# Patient Record
Sex: Male | Born: 1965
Health system: Southern US, Community
[De-identification: ages and names within clinical notes are randomized; demographics above are authoritative.]

## PROBLEM LIST (undated history)

## (undated) DIAGNOSIS — E785 Hyperlipidemia, unspecified: Secondary | ICD-10-CM

## (undated) DIAGNOSIS — F419 Anxiety disorder, unspecified: Secondary | ICD-10-CM

## (undated) DIAGNOSIS — M7022 Olecranon bursitis, left elbow: Secondary | ICD-10-CM

## (undated) DIAGNOSIS — Z972 Presence of dental prosthetic device (complete) (partial): Secondary | ICD-10-CM

## (undated) DIAGNOSIS — I10 Essential (primary) hypertension: Secondary | ICD-10-CM

## (undated) DIAGNOSIS — Z955 Presence of coronary angioplasty implant and graft: Secondary | ICD-10-CM

## (undated) DIAGNOSIS — K219 Gastro-esophageal reflux disease without esophagitis: Secondary | ICD-10-CM

## (undated) DIAGNOSIS — J9690 Respiratory failure, unspecified, unspecified whether with hypoxia or hypercapnia: Secondary | ICD-10-CM

## (undated) DIAGNOSIS — I2510 Atherosclerotic heart disease of native coronary artery without angina pectoris: Secondary | ICD-10-CM

## (undated) HISTORY — PX: CARDIAC CATHETERIZATION: SHX172

## (undated) HISTORY — PX: NEUROPLASTY / TRANSPOSITION ULNAR NERVE AT ELBOW: SUR895

## (undated) HISTORY — PX: CORONARY ANGIOPLASTY WITH STENT PLACEMENT: SHX49

## (undated) HISTORY — PX: TONSILLECTOMY: SUR1361

---

## 1998-01-13 ENCOUNTER — Emergency Department (HOSPITAL_COMMUNITY): Admission: EM | Admit: 1998-01-13 | Discharge: 1998-01-13 | Payer: Self-pay | Admitting: Emergency Medicine

## 1998-03-11 ENCOUNTER — Emergency Department (HOSPITAL_COMMUNITY): Admission: EM | Admit: 1998-03-11 | Discharge: 1998-03-11 | Payer: Self-pay | Admitting: Emergency Medicine

## 1998-05-31 ENCOUNTER — Emergency Department (HOSPITAL_COMMUNITY): Admission: EM | Admit: 1998-05-31 | Discharge: 1998-05-31 | Payer: Self-pay | Admitting: Internal Medicine

## 2000-10-12 HISTORY — PX: KNEE ARTHROSCOPY W/ MENISCECTOMY: SHX1879

## 2000-12-09 ENCOUNTER — Emergency Department (HOSPITAL_COMMUNITY): Admission: EM | Admit: 2000-12-09 | Discharge: 2000-12-09 | Payer: Self-pay | Admitting: Emergency Medicine

## 2000-12-09 ENCOUNTER — Encounter: Payer: Self-pay | Admitting: Emergency Medicine

## 2004-10-12 HISTORY — PX: OTHER SURGICAL HISTORY: SHX169

## 2008-02-11 ENCOUNTER — Emergency Department (HOSPITAL_COMMUNITY): Admission: EM | Admit: 2008-02-11 | Discharge: 2008-02-11 | Payer: Self-pay | Admitting: Emergency Medicine

## 2008-02-12 ENCOUNTER — Emergency Department (HOSPITAL_COMMUNITY): Admission: EM | Admit: 2008-02-12 | Discharge: 2008-02-12 | Payer: Self-pay | Admitting: Emergency Medicine

## 2008-12-27 ENCOUNTER — Encounter: Admission: RE | Admit: 2008-12-27 | Discharge: 2008-12-27 | Payer: Self-pay | Admitting: Cardiovascular Disease

## 2008-12-28 ENCOUNTER — Inpatient Hospital Stay (HOSPITAL_COMMUNITY): Admission: RE | Admit: 2008-12-28 | Discharge: 2008-12-29 | Payer: Self-pay | Admitting: Cardiovascular Disease

## 2008-12-28 DIAGNOSIS — Z955 Presence of coronary angioplasty implant and graft: Secondary | ICD-10-CM

## 2008-12-28 HISTORY — DX: Presence of coronary angioplasty implant and graft: Z95.5

## 2009-02-07 ENCOUNTER — Ambulatory Visit: Payer: Self-pay | Admitting: Gastroenterology

## 2009-12-09 ENCOUNTER — Ambulatory Visit (HOSPITAL_COMMUNITY): Admission: RE | Admit: 2009-12-09 | Discharge: 2009-12-09 | Payer: Self-pay | Admitting: Cardiology

## 2011-01-22 LAB — CBC
HCT: 42 % (ref 39.0–52.0)
Hemoglobin: 14.8 g/dL (ref 13.0–17.0)
MCHC: 35.4 g/dL (ref 30.0–36.0)
MCV: 93.1 fL (ref 78.0–100.0)
Platelets: 191 10*3/uL (ref 150–400)
RBC: 4.51 MIL/uL (ref 4.22–5.81)
RDW: 12.5 % (ref 11.5–15.5)
WBC: 13.1 10*3/uL — ABNORMAL HIGH (ref 4.0–10.5)

## 2011-01-22 LAB — BASIC METABOLIC PANEL
BUN: 9 mg/dL (ref 6–23)
CO2: 25 mEq/L (ref 19–32)
Calcium: 9 mg/dL (ref 8.4–10.5)
Chloride: 101 mEq/L (ref 96–112)
Creatinine, Ser: 1.08 mg/dL (ref 0.4–1.5)
GFR calc Af Amer: >60 mL/min (ref >60)
GFR calc non Af Amer: >60 mL/min (ref >60)
Glucose, Bld: 95 mg/dL (ref 70–99)
Potassium: 3.9 mEq/L (ref 3.5–5.1)
Sodium: 137 mEq/L (ref 135–145)

## 2011-01-22 LAB — CARDIAC PANEL(CRET KIN+CKTOT+MB+TROPI)
CK, MB: 1.3 ng/mL (ref 0.3–4.0)
CK, MB: 2.2 ng/mL (ref 0.3–4.0)
Relative Index: INVALID (ref 0.0–2.5)
Total CK: 88 U/L (ref 7–232)
Troponin I: 0.06 ng/mL (ref 0.00–0.06)
Troponin I: 0.13 ng/mL — ABNORMAL HIGH (ref 0.00–0.06)

## 2011-02-24 NOTE — Cardiovascular Report (Signed)
NAME:  Matthew [name], Matthew [name] NO.:  [id number]   MEDICAL RECORD NO.:  [id number]          PATIENT TYPE:  INP   LOCATION:  2507                         FACILITY:  [facility]   PHYSICIAN:  Nanetta Batty, M.D.   DATE OF BIRTH:  04-08-1966   DATE OF PROCEDURE:  DATE OF DISCHARGE:                            CARDIAC CATHETERIZATION   Matthew [name] is a delightful 45 year old mildly overweight, married  Caucasian male who works as a Naval architect for [employer].  He is  referred by Dr. Hal Hope for new onset chest pain which has been  progressive and accelerating.   Matthew [name] profile is positive for a 30-pack-a-year history of  tobacco abuse, currently smoking 1 pack per day.  He has hyperlipidemia  with mildly elevated LDL and low HDL.  He presents now for outpatient  diagnostic coronary angiography to define his anatomy and rule out  ischemic etiology.   DESCRIPTION OF PROCEDURE:  The patient was brought to the Second Floor   Cardiac Cath Lab in the postabsorptive state.  He was  premedicated with p.o. Valium.  His right groin was prepped and shaved  in the usual sterile fashion.  Xylocaine 1% was used for local  anesthesia.  A 6-French sheath was inserted to the right femoral artery  using standard Seldinger technique.  A 6-French right and left Judkins  diagnostic catheters as well as 6-French pigtail catheter were used for  selective coronary angiography, left ventriculography, and distal  abdominal aortography.  Visipaque dye was used through the entirety of  the case.  Retrograde aortic, left ventricular, and pullback pressures  were recorded.   HEMODYNAMICS:  1. Aortic systolic pressure 121, diastolic pressure 73.  2. Left ventricular systolic pressure 127, end-diastolic pressure 8.   SELECTIVE CORONARY ANGIOGRAPHY:  1. Left main normal.  2. LAD normal.  3. Left circumflex normal.  4. Right coronary artery was dominant with tandem 50-60%  blockages in      the proximal and midportion.  5. Left ventriculography; RAO left ventriculogram was performed using      25 mL of Visipaque dye at 12 mL per second.  The overall LVEF was      estimated at greater than 60% without focal wall motion      abnormalities.  6. Distal abdominal aortography; distal abdominal aortogram was      performed using 20 mL of Visipaque dye at 20 mL per second.  The      renal arteries were widely patent.  The infrarenal abdominal aorta      and iliac bifurcation appeared free of significant for      atherosclerotic changes.   IMPRESSION:  Matthew [name] has moderate angiographic stenosis in the  proximal mid dominant right coronary artery without any other lesions in  his coronary tree.  We will proceed with intravascular ultrasound  interrogation and possible intervention.   The patient received 4 baby aspirin prior to coming to the Cath Lab.  He  received Angiomax bolus with an ACT of approximately 300.  Visipaque dye  was used through the entirety of the case.  A 200 mcg of intracoronary  nitroglycerin was administered.   Using a 6-French Cordis JR-4 guide catheter along with a 4190 Asahi soft  wire and IVUS catheter, intravascular ultrasound was performed.  The  proximal and distal reference segment measured approximately 41 x 44.  The minimal lumen diameter in the proximal lesion was 3.83 mm sq and in  the mid lesion 3.7 mm sq with concentric ring of calcium.  The proximal  lesion was then directly stented with a 35 x 18 Xience V Abbott drug-  eluting stent up to 12 atmospheres and postdilated with a 40 x 12 Wickliffe  Voyager up to 14 atmospheres, resulting in reduction of a 60% lesion to  0% residual.  Following this, a cutting balloon atherectomy was  performed in the concentric ring in the mid dominant right using a 2.75  x 10 cutting balloon at nominal pressures.  Following this, stenting was  performed with a 3.5 x 15 Xience overlapping with  the proximal stent.  The stented segment was then postdilated with a 4.0 x 12 Clay Voyager at  14 atmospheres.  A completion IVUS film was performed revealing  excellent stent apposition throughout the entire stented segment.  The  patient tolerated the procedure well.  There were no  electrocardiographic or hemodynamic abnormalities.   IMPRESSION:  Successful intravascular ultrasound-guided percutaneous  coronary intervention and stenting of the proximal and mid dominant  right coronary artery using cutting balloon atherectomy, Xience drug-  eluting stent, and Angiomax.  The Angiomax was turned off at the end of  the case.  Sheath was sewn securely in place.  The guidewire and  catheter were removed.  Angiomax was discontinued.  The patient left Lab  in stable condition.  Dr. Hal Hope was notified of these results.   Plans will be gently hydrate him, discharge him home in the morning on  aspirin and Plavix.  He will also need to start a statin and Niaspan.  He will need counseling for risk factor modification including smoking  cessation.      Nanetta Batty, M.D.  Electronically Signed     JB/MEDQ  D:  12/28/2008  T:  12/29/2008  Job:  [id number]   cc:   Second Floor [facility]  [facility]

## 2011-02-24 NOTE — Discharge Summary (Signed)
NAME:  Matthew [name], Matthew [name] NO.:  [id number]   MEDICAL RECORD NO.:  [id number]          PATIENT TYPE:  INP   LOCATION:  2507                         FACILITY:  [facility]   PHYSICIAN:  Nanetta Batty, M.D.   DATE OF BIRTH:  24-Oct-1965   DATE OF ADMISSION:  12/28/2008  DATE OF DISCHARGE:  12/29/2008                               DISCHARGE SUMMARY   DISCHARGE DIAGNOSES:  1. Chest pain consistent with unstable angina, new onset.  2. Coronary artery disease, elective right coronary artery drug-      eluting stents placed this admission.  3. Smoking.  4. Mixed dyslipidemia.   HOSPITAL COURSE:  The patient is a 45 year old male who was referred to  Dr. Allyson Sabal by Dr. Nadyne Coombes for exertional chest pain consistent with  new onset angina.  He was seen in the office on December 27, 2008.  Please  see Dr. Hazle Coca office note for complete details.  The patient was set  up for an elective catheterization, which was done on December 28, 2008.  This revealed proximal RCA disease of 60% and mid RCA disease of 50%.  This area was treated with 2 Xience drug-eluting stents.  The left main,  circumflex, and LAD were normal.  His LV function was normal.  Renal  arteries, aorta, and iliac arteries were normal.  The patient tolerated  the procedure well.  Postprocedure, he did have a slight bump in his  troponin of 0.13, although his CK-MBs were negative.  Dr. Lynnea Ferrier feels  he can be discharged on December 29, 2008.  We have added low-dose beta-  blocker and statin at discharge, the patient was already on niacin at  home.  We will continue with aspirin and he will be on lifelong Plavix.   DISCHARGE MEDICATIONS:  1. Coated aspirin 325 mg a day.  2. Plavix 75 mg a day.  3. Nitroglycerin 0.4 mg sublingual p.r.n.  4. Metoprolol 12.5 mg b.i.d.  5. Simvastatin 40 mg a day.  6. Niacin 500 mg at bedtime.  7. The patient will continue his Cymbalta 60 mg a day.   LABORATORY DATA:  At discharge, his  white count is 13.1, hemoglobin  14.8, hematocrit 42.0, and platelets 191.  Sodium 137, potassium 3.9,  BUN 9, and creatinine 1.8.  CK-MBs are negative.  Troponin post PCI was  slightly elevated at 0.13.  Chest x-ray did show some evidence of  bronchitis.   PLAN:  The patient is discharged in stable condition.  At discharge, he  does not have a productive cough and his lungs are clear.  He is not  febrile.  We have strongly encouraged him to stop smoking.  We will try  and get him involved in Cardiac Rehab.  For now, he will be out of work  until he sees Dr. Allyson Sabal back.      Abelino Derrick, P.A.      Nanetta Batty, M.D.  Electronically Signed    LKK/MEDQ  D:  12/29/2008  T:  12/29/2008  Job:  [id number]   cc:   Clydie Braun L. Hal Hope, M.D.

## 2016-03-27 ENCOUNTER — Other Ambulatory Visit (HOSPITAL_COMMUNITY): Payer: Self-pay | Admitting: Internal Medicine

## 2016-03-27 DIAGNOSIS — I519 Heart disease, unspecified: Secondary | ICD-10-CM

## 2016-03-30 ENCOUNTER — Ambulatory Visit (HOSPITAL_COMMUNITY)
Admission: RE | Admit: 2016-03-30 | Discharge: 2016-03-30 | Disposition: A | Discharge status: Home / Self Care | Payer: Self-pay | Source: Ambulatory Visit | Attending: Internal Medicine | Admitting: Internal Medicine

## 2016-03-30 DIAGNOSIS — I517 Cardiomegaly: Secondary | ICD-10-CM | POA: Insufficient documentation

## 2016-03-30 DIAGNOSIS — I071 Rheumatic tricuspid insufficiency: Secondary | ICD-10-CM | POA: Insufficient documentation

## 2016-03-30 DIAGNOSIS — I2510 Atherosclerotic heart disease of native coronary artery without angina pectoris: Secondary | ICD-10-CM | POA: Insufficient documentation

## 2016-03-30 DIAGNOSIS — I519 Heart disease, unspecified: Secondary | ICD-10-CM | POA: Insufficient documentation

## 2016-03-30 HISTORY — PX: TRANSTHORACIC ECHOCARDIOGRAM: SHX275

## 2016-03-30 LAB — ECHOCARDIOGRAM COMPLETE
EERAT: 4.95
FS: 24 % — AB (ref 28–44)
IVS/LV PW RATIO, ED: 1.02
LA ID, A-P, ES: 39.5 mm
LEFT ATRIUM END SYS DIAM: 39.5 mm
LV PW d: 11.8 mm — AB (ref 0.6–1.1)
LV SIMPSON'S DISK: 49
LVEEAVG: 4.95
LVEEMED: 4.95
LVELAT: 15.3 cm/s
LVOT area: 3.9 cm2
LVOT diameter: 22.3 mm
MV Peak grad: 2 mmHg
MV pk A vel: 74.3 m/s
MV pk E vel: 75.7 m/s
Stroke v: 44 ml
TDI e' lateral: 15.3
TDI e' medial: 9.21

## 2016-03-30 NOTE — Progress Notes (Signed)
Echocardiogram 2D Echocardiogram has been performed.  Matthew [name] 03/30/2016, 9:57 AM

## 2017-04-30 ENCOUNTER — Encounter (HOSPITAL_BASED_OUTPATIENT_CLINIC_OR_DEPARTMENT_OTHER): Payer: Self-pay | Admitting: *Deleted

## 2017-04-30 NOTE — H&P (Signed)
Matthew [name] is an 51 y.o. male.   Chief Complaint: LEFT ELBOW OLECRANON BURSITIS  HPI: Matthew [name] IS A 51 Y/O RIGHT HAND DOMINANT MALE WHO HAS HAD SWELLING OF THE LEFT OLECRANON BURSA FOR SEVERAL MONTHS WITH NO KNOWN INJURY. HE HAS HAD THE OLECRANON BURSA ASPIRATED ON TWO OCCASIONS WITH THE MOST RECENT BEING ON 04/08/17. AFTER BOTH ASPIRATIONS, THE BURSA FILLED BACK UP AFTER A COUPLE OF DAYS.  DISCUSSED THE REASON AND RATIONALE FOR SURGICAL REMOVAL OF THE BURSA.  DISCUSSED THE SURGICAL PROCEDURE, INCLUDING RISKS VERSUS BENEFITS, AND THE POST-OPERATIVE RECOVERY PROCESS.  HE IS HERE TODAY FOR SURGERY.  HE DENIES NUMBNESS, TINGLING, CHEST PAIN, SHORTNESS OF BREATH, NAUSEA, VOMITING, OR DIARRHEA.  Past Medical History:  Diagnosis Date  . Coronary artery disease per pt last cardiology visit 2011 (found documentation 12-05-2009 w/ dr Leonia Reeves with [facility] in epic)/  currently pt is followed by pcp   12-28-2008  PCI and DES x2 to proximal and mid RCA/ last cardiac cath 12-09-2009  widely patent stents  . GERD (gastroesophageal reflux disease)   . Hyperlipidemia   . Olecranon bursitis of left elbow   . S/P right coronary artery (RCA) stent placement 12/28/2008   PCI and DES x2-- proximal and mid RCA  . Wears dentures    UPPER    Past Surgical History:  Procedure Laterality Date  . CARDIAC CATHETERIZATION  12-09-2009  dr Ilda Foil   abnormal stress test (12-05-2009) widely patent coronaries present w/ evidence of diffuse atherosclerotic disease/  widely patent stented segments in the proximal and mid segment RCA/  LVEF 65%  . CORONARY ANGIOPLASTY WITH STENT PLACEMENT  12-28-2008  dr berry   PCI and stenting of the proxmial and mid RCA using cutting balloon atherectomy (x2 DES, Xience)/  LVEF >60% w/ normal wall motion  . KNEE ARTHROSCOPY W/ MENISCECTOMY Right 2002  . NEUROPLASTY / TRANSPOSITION ULNAR NERVE AT ELBOW Right 10/2014   dr Caralyn Guile  . ORIF RIGHT ANKLE FX  2006  . TONSILLECTOMY   age 71  . TRANSTHORACIC ECHOCARDIOGRAM  03/30/2016   mild concentric LVH, ef 50-55%/  trivial TR    History reviewed. No pertinent family history. Social History:  reports that he has been smoking Cigarettes.  He has a 78.00 pack-year smoking history. He has never used smokeless tobacco. He reports that he drinks alcohol. He reports that he does not use drugs.  Allergies:  Allergies  Allergen Reactions  . Codeine Nausea And Vomiting  . Onion Swelling    "thoart swells up"    No prescriptions prior to admission.    No results found for this or any previous visit (from the past 48 hour(s)). No results found.  ROS NO RECENT ILLNESSES OR HOSPITALIZATIONS  Height 5\' 11"  (1.803 m), weight 104.3 kg (230 lb). Physical Exam  General Appearance:  Alert, cooperative, no distress, appears stated age  Head:  Normocephalic, without obvious abnormality, atraumatic  Eyes:  Pupils equal, conjunctiva/corneas clear,         Throat: Lips, mucosa, and tongue normal; teeth and gums normal  Neck: No visible masses     Lungs:   respirations unlabored  Chest Wall:  No tenderness or deformity  Heart:  Regular rate and rhythm,  Abdomen:   Soft, non-tender,         Extremities: LUE: MODERATE SWELLING OF THE OLECRANON BURSA WITH NO ERYTHEMA, ECCHYMOSIS OR PURULENT DRAINAGE. NO INCREASED WARMTH OF THE ELBOW. CAPILLARY REFILL LESS THAN 2 SECONDS. SENSATION INTACT TO  LIGHT TOUCH. BURSA IS TENDER TO PALPATION. FULL RANGE OF MOTION OF THE ELBOW, WRIST, AND HAND.   Pulses: 2+ and symmetric  Skin: Skin color, texture, turgor normal, no rashes or lesions     Neurologic: Normal    Assessment LEFT ELBOW OLECRANON BURSITIS  Plan LEFT ELBOW OLECRANON BURSA EXCISION R/B/A DISCUSSED WITH PT IN OFFICE.  PT VOICED UNDERSTANDING OF PLAN CONSENT SIGNED DAY OF SURGERY PT SEEN AND EXAMINED PRIOR TO OPERATIVE PROCEDURE/DAY OF SURGERY SITE MARKED. QUESTIONS ANSWERED WILL GO HOME FOLLOWING SURGERY  WE ARE  PLANNING SURGERY FOR YOUR UPPER EXTREMITY. THE RISKS AND BENEFITS OF SURGERY INCLUDE BUT NOT LIMITED TO BLEEDING INFECTION, DAMAGE TO NEARBY NERVES ARTERIES TENDONS, FAILURE OF SURGERY TO ACCOMPLISH ITS INTENDED GOALS, PERSISTENT SYMPTOMS AND NEED FOR FURTHER SURGICAL INTERVENTION. WITH THIS IN MIND WE WILL PROCEED. I HAVE DISCUSSED WITH THE PATIENT THE PRE AND POSTOPERATIVE REGIMEN AND THE DOS AND DON'TS. PT VOICED UNDERSTANDING AND INFORMED CONSENT SIGNED.  Brynda Peon 04/30/2017, 10:34 AM

## 2017-04-30 NOTE — Progress Notes (Addendum)
NPO AFTER MN W/ EXCEPTION CLEAR LIQUIDS UNTIL 0700 (NO CREAM [place] [employer]).  ARRIVE AT 1115.  NEEDS HG AND EKG.  WILL TAKE ZANTAC AM DOS W/ SIPS OF WATER.    ADDENDUM:  PT DOES NOT SEE CARIOLOGIST BY IS FOLLOWED BY PCP DR RAMACHANDRAM/ DR BRAIN MACKENZIE, REQUESTED AND RECEIVED LAST OFFICE NOTE VIA FAX . WITH THE CHART.

## 2017-05-06 ENCOUNTER — Ambulatory Visit (HOSPITAL_BASED_OUTPATIENT_CLINIC_OR_DEPARTMENT_OTHER): Payer: 59 | Admitting: Anesthesiology

## 2017-05-06 ENCOUNTER — Encounter (HOSPITAL_BASED_OUTPATIENT_CLINIC_OR_DEPARTMENT_OTHER): Payer: Self-pay | Admitting: *Deleted

## 2017-05-06 ENCOUNTER — Other Ambulatory Visit: Payer: Self-pay

## 2017-05-06 ENCOUNTER — Ambulatory Visit (HOSPITAL_BASED_OUTPATIENT_CLINIC_OR_DEPARTMENT_OTHER)
Admission: RE | Admit: 2017-05-06 | Discharge: 2017-05-06 | Disposition: A | Discharge status: Home / Self Care | Payer: 59 | Source: Ambulatory Visit | Attending: Orthopedic Surgery | Admitting: Orthopedic Surgery

## 2017-05-06 ENCOUNTER — Encounter (HOSPITAL_BASED_OUTPATIENT_CLINIC_OR_DEPARTMENT_OTHER)
Admission: RE | Disposition: A | Discharge status: Home / Self Care | Payer: Self-pay | Source: Ambulatory Visit | Attending: Orthopedic Surgery

## 2017-05-06 DIAGNOSIS — M7022 Olecranon bursitis, left elbow: Secondary | ICD-10-CM

## 2017-05-06 DIAGNOSIS — K648 Other hemorrhoids: Secondary | ICD-10-CM | POA: Insufficient documentation

## 2017-05-06 DIAGNOSIS — D122 Benign neoplasm of ascending colon: Secondary | ICD-10-CM | POA: Insufficient documentation

## 2017-05-06 DIAGNOSIS — Z1211 Encounter for screening for malignant neoplasm of colon: Secondary | ICD-10-CM | POA: Insufficient documentation

## 2017-05-06 DIAGNOSIS — K644 Residual hemorrhoidal skin tags: Secondary | ICD-10-CM | POA: Insufficient documentation

## 2017-05-06 DIAGNOSIS — K5730 Diverticulosis of large intestine without perforation or abscess without bleeding: Secondary | ICD-10-CM | POA: Insufficient documentation

## 2017-05-06 HISTORY — DX: Olecranon bursitis, left elbow: M70.22

## 2017-05-06 HISTORY — DX: Presence of dental prosthetic device (complete) (partial): Z97.2

## 2017-05-06 HISTORY — DX: Gastro-esophageal reflux disease without esophagitis: K21.9

## 2017-05-06 HISTORY — DX: Atherosclerotic heart disease of native coronary artery without angina pectoris: I25.10

## 2017-05-06 HISTORY — DX: Hyperlipidemia, unspecified: E78.5

## 2017-05-06 HISTORY — PX: OLECRANON BURSECTOMY: SHX2097

## 2017-05-06 HISTORY — DX: Presence of coronary angioplasty implant and graft: Z95.5

## 2017-05-06 SURGERY — BURSECTOMY, ELBOW
Anesthesia: General | Site: Elbow | Laterality: Left

## 2017-05-06 MED ORDER — KETOROLAC TROMETHAMINE 30 MG/ML IJ SOLN
30.0000 mg | Freq: Once | INTRAMUSCULAR | Status: DC | PRN
  Filled 2017-05-06: qty 1

## 2017-05-06 MED ORDER — DEXAMETHASONE SODIUM PHOSPHATE 10 MG/ML IJ SOLN
INTRAMUSCULAR | Status: AC
  Filled 2017-05-06: qty 1

## 2017-05-06 MED ORDER — FENTANYL CITRATE (PF) 100 MCG/2ML IJ SOLN
INTRAMUSCULAR | Status: DC | PRN
  Administered 2017-05-06: 50 ug via INTRAVENOUS
  Administered 2017-05-06: 100 ug via INTRAVENOUS

## 2017-05-06 MED ORDER — FENTANYL CITRATE (PF) 100 MCG/2ML IJ SOLN
INTRAMUSCULAR | Status: AC
  Filled 2017-05-06: qty 4

## 2017-05-06 MED ORDER — LIDOCAINE 2% (20 MG/ML) 5 ML SYRINGE
INTRAMUSCULAR | Status: AC
  Filled 2017-05-06: qty 5

## 2017-05-06 MED ORDER — PROPOFOL 10 MG/ML IV BOLUS
INTRAVENOUS | Status: DC | PRN
  Administered 2017-05-06: 270 mg via INTRAVENOUS

## 2017-05-06 MED ORDER — BUPIVACAINE-EPINEPHRINE (PF) 0.5% -1:200000 IJ SOLN
INTRAMUSCULAR | Status: DC | PRN
  Administered 2017-05-06: 9 mL via PERINEURAL

## 2017-05-06 MED ORDER — FENTANYL CITRATE (PF) 100 MCG/2ML IJ SOLN
25.0000 ug | INTRAMUSCULAR | Status: DC | PRN
  Filled 2017-05-06: qty 1

## 2017-05-06 MED ORDER — ONDANSETRON HCL 4 MG/2ML IJ SOLN
INTRAMUSCULAR | Status: AC
  Filled 2017-05-06: qty 2

## 2017-05-06 MED ORDER — MIDAZOLAM HCL 2 MG/2ML IJ SOLN
INTRAMUSCULAR | Status: AC
  Filled 2017-05-06: qty 2

## 2017-05-06 MED ORDER — PROMETHAZINE HCL 25 MG/ML IJ SOLN
6.2500 mg | INTRAMUSCULAR | Status: DC | PRN
  Filled 2017-05-06: qty 1

## 2017-05-06 MED ORDER — EPHEDRINE 5 MG/ML INJ
INTRAVENOUS | Status: AC
  Filled 2017-05-06: qty 20

## 2017-05-06 MED ORDER — DEXAMETHASONE SODIUM PHOSPHATE 4 MG/ML IJ SOLN
INTRAMUSCULAR | Status: DC | PRN
  Administered 2017-05-06: 10 mg via INTRAVENOUS

## 2017-05-06 MED ORDER — CEFAZOLIN SODIUM-DEXTROSE 2-4 GM/100ML-% IV SOLN
2.0000 g | INTRAVENOUS | Status: AC
  Administered 2017-05-06: 2 g via INTRAVENOUS
  Filled 2017-05-06: qty 100

## 2017-05-06 MED ORDER — SODIUM CHLORIDE 0.9 % IR SOLN
Status: DC | PRN
  Administered 2017-05-06: 1000 mL

## 2017-05-06 MED ORDER — CEFAZOLIN SODIUM-DEXTROSE 2-4 GM/100ML-% IV SOLN
INTRAVENOUS | Status: AC
  Filled 2017-05-06: qty 100

## 2017-05-06 MED ORDER — LIDOCAINE 2% (20 MG/ML) 5 ML SYRINGE
INTRAMUSCULAR | Status: DC | PRN
  Administered 2017-05-06: 70 mg via INTRAVENOUS

## 2017-05-06 MED ORDER — ONDANSETRON HCL 4 MG/2ML IJ SOLN
INTRAMUSCULAR | Status: DC | PRN
  Administered 2017-05-06: 4 mg via INTRAVENOUS

## 2017-05-06 MED ORDER — PROPOFOL 10 MG/ML IV BOLUS
INTRAVENOUS | Status: AC
  Filled 2017-05-06: qty 20

## 2017-05-06 MED ORDER — MIDAZOLAM HCL 5 MG/5ML IJ SOLN
INTRAMUSCULAR | Status: DC | PRN
  Administered 2017-05-06: 2 mg via INTRAVENOUS

## 2017-05-06 MED ORDER — CHLORHEXIDINE GLUCONATE 4 % EX LIQD
60.0000 mL | Freq: Once | CUTANEOUS | Status: DC
  Filled 2017-05-06: qty 118

## 2017-05-06 MED ORDER — LACTATED RINGERS IV SOLN
INTRAVENOUS | Status: DC
  Administered 2017-05-06 (×2): via INTRAVENOUS
  Filled 2017-05-06: qty 1000

## 2017-05-06 SURGICAL SUPPLY — 69 items
BANDAGE ACE 4X5 VEL STRL LF (GAUZE/BANDAGES/DRESSINGS) ×3 IMPLANT
BANDAGE ELASTIC 4 VELCRO ST LF (GAUZE/BANDAGES/DRESSINGS) ×6 IMPLANT
BLADE SURG 15 STRL LF DISP TIS (BLADE) ×1 IMPLANT
BLADE SURG 15 STRL SS (BLADE) ×3
BNDG CMPR 9X4 STRL LF SNTH (GAUZE/BANDAGES/DRESSINGS) ×1
BNDG COHESIVE 4X5 TAN NS LF (GAUZE/BANDAGES/DRESSINGS) IMPLANT
BNDG CONFORM 3 STRL LF (GAUZE/BANDAGES/DRESSINGS) ×3 IMPLANT
BNDG ESMARK 4X9 LF (GAUZE/BANDAGES/DRESSINGS) ×3 IMPLANT
BNDG GAUZE ELAST 4 BULKY (GAUZE/BANDAGES/DRESSINGS) ×2 IMPLANT
CANISTER SUCT 1200ML W/VALVE (MISCELLANEOUS) ×3 IMPLANT
CORDS BIPOLAR (ELECTRODE) ×2 IMPLANT
COVER BACK TABLE 60X90IN (DRAPES) ×3 IMPLANT
CUFF TOURNIQUET SINGLE 18IN (TOURNIQUET CUFF) ×3 IMPLANT
DRAPE EXTREMITY T 121X128X90 (DRAPE) ×3 IMPLANT
DRAPE LG THREE QUARTER DISP (DRAPES) ×3 IMPLANT
DRAPE U-SHAPE 47X51 STRL (DRAPES) ×3 IMPLANT
DRSG ADAPTIC 3X8 NADH LF (GAUZE/BANDAGES/DRESSINGS) ×2 IMPLANT
ELECT NDL TIP 2.8 STRL (NEEDLE) IMPLANT
ELECT NEEDLE TIP 2.8 STRL (NEEDLE) IMPLANT
ELECT REM PT RETURN 9FT ADLT (ELECTROSURGICAL) ×3
ELECTRODE REM PT RTRN 9FT ADLT (ELECTROSURGICAL) ×1 IMPLANT
GAUZE SPONGE 4X4 12PLY STRL (GAUZE/BANDAGES/DRESSINGS) ×2 IMPLANT
GAUZE XEROFORM 1X8 LF (GAUZE/BANDAGES/DRESSINGS) ×3 IMPLANT
GLOVE BIO SURGEON STRL SZ 6.5 (GLOVE) ×2 IMPLANT
GLOVE BIO SURGEONS STRL SZ 6.5 (GLOVE) ×1
GLOVE BIOGEL PI IND STRL 7.0 (GLOVE) ×1 IMPLANT
GLOVE BIOGEL PI INDICATOR 7.0 (GLOVE) ×2
GLOVE INDICATOR 8.5 STRL (GLOVE) ×3 IMPLANT
GLOVE SURG ORTHO 8.0 STRL STRW (GLOVE) ×3 IMPLANT
GOWN STRL REUS W/ TWL LRG LVL3 (GOWN DISPOSABLE) ×1 IMPLANT
GOWN STRL REUS W/TWL LRG LVL3 (GOWN DISPOSABLE) ×3
KIT RM TURNOVER CYSTO AR (KITS) ×3 IMPLANT
NDL 1/2 CIR CATGUT .05X1.09 (NEEDLE) IMPLANT
NDL HYPO 25X1 1.5 SAFETY (NEEDLE) ×1 IMPLANT
NEEDLE 1/2 CIR CATGUT .05X1.09 (NEEDLE) IMPLANT
NEEDLE HYPO 25X1 1.5 SAFETY (NEEDLE) ×3 IMPLANT
NS IRRIG 500ML POUR BTL (IV SOLUTION) ×3 IMPLANT
PACK BASIN DAY SURGERY FS (CUSTOM PROCEDURE TRAY) ×3 IMPLANT
PAD CAST 4YDX4 CTTN HI CHSV (CAST SUPPLIES) ×1 IMPLANT
PADDING CAST COTTON 4X4 STRL (CAST SUPPLIES) ×3
PENCIL BUTTON HOLSTER BLD 10FT (ELECTRODE) ×3 IMPLANT
SLING ARM LRG ADULT FOAM STRAP (SOFTGOODS) ×3 IMPLANT
SLING ARM MED ADULT FOAM STRAP (SOFTGOODS) IMPLANT
SPLINT FAST PLASTER 5X30 (CAST SUPPLIES)
SPLINT FIBERGLASS 3X35 (CAST SUPPLIES) IMPLANT
SPLINT FIBERGLASS 4X30 (CAST SUPPLIES) ×3 IMPLANT
SPLINT PLASTER CAST FAST 5X30 (CAST SUPPLIES) IMPLANT
STOCKINETTE 4X48 STRL (DRAPES) ×3 IMPLANT
SUCTION FRAZIER HANDLE 10FR (MISCELLANEOUS) ×2
SUCTION TUBE FRAZIER 10FR DISP (MISCELLANEOUS) ×1 IMPLANT
SUT MNCRL AB 3-0 PS2 18 (SUTURE) ×3 IMPLANT
SUT MNCRL AB 4-0 PS2 18 (SUTURE) ×3 IMPLANT
SUT MON AB 3-0 SH 27 (SUTURE)
SUT MON AB 3-0 SH27 (SUTURE) IMPLANT
SUT PROLENE 3 0 PS 1 (SUTURE) ×4 IMPLANT
SUT PROLENE 4 0 PS 2 18 (SUTURE) IMPLANT
SUT VIC AB 0 CT1 27 (SUTURE)
SUT VIC AB 0 CT1 27XBRD ANBCTR (SUTURE) IMPLANT
SUT VIC AB 2-0 PS2 27 (SUTURE) IMPLANT
SUT VIC AB 2-0 SH 27 (SUTURE)
SUT VIC AB 2-0 SH 27XBRD (SUTURE) IMPLANT
SUT VICRYL 4-0 PS2 18IN ABS (SUTURE) IMPLANT
SYR BULB 3OZ (MISCELLANEOUS) ×3 IMPLANT
SYR CONTROL 10ML LL (SYRINGE) ×3 IMPLANT
TOWEL OR 17X24 6PK STRL BLUE (TOWEL DISPOSABLE) ×6 IMPLANT
TRAY DSU PREP LF (CUSTOM PROCEDURE TRAY) ×3 IMPLANT
TUBE CONNECTING 12'X1/4 (SUCTIONS) ×1
TUBE CONNECTING 12X1/4 (SUCTIONS) ×2 IMPLANT
UNDERPAD 30X30 INCONTINENT (UNDERPADS AND DIAPERS) ×3 IMPLANT

## 2017-05-06 NOTE — Op Note (Signed)
PREOPERATIVE DIAGNOSIS: Left elbow olecranon bursitis  POSTOPERATIVE DIAGNOSIS: Left elbow olecranon bursitis  ATTENDING SURGEON: Dr. Iran Planas who was scrubbed and present for the entire procedure  ASSISTANT SURGEON: None  ANESTHESIA: Gen. via laryngeal mask airway  OPERATIVE PROCEDURE: Left elbow olecranon bursectomy  IMPLANTS: None  RADIOGRAPHIC INTERPRETATION: None  SURGICAL INDICATIONS: Mr. Fabio Neighbors a right-hand-dominant gentleman with a persistent olecranon bursitis. Patient been refractory to conservative treatment. Risks benefits and alternatives were discussed in detail with the patient in a signed informed consent was obtained. Risks of surgery include but not limited to bleeding infection damage to nearby nerves arteries or tendons loss of motion of the wrists and digits incomplete relief of symptoms and need for further surgical intervention.  SURGICAL TECHNIQUE: Patient is properly identified in the preoperative holding area and a mark with a permanent marker made on the left elbow. Patient was then brought back to the operating room and placed supine on anesthesia and table where general anesthesia was administered. Patient tolerated this well. Preoperative antibiotics were given. A well-padded tourniquet was then placed on the left brachium and sealed with a sterile drape. The left upper extremity was then prepped and draped in normal sterile fashion. Timeout was called cracks I was identified and the procedure was then begun. Attention turned to the lateral left elbow curvilinear incision was made posteriorly. This is curving radially around the olecranon tip. Dissection carried down through the skin subtendinous tissue with a large olecranon bursa was then circumferentially dissected. The bursa sac was then dissected free from this the subcutaneous tissues. Bipolar cautery was used for an electrocautery used for hemostasis. After removal of the bursa sac the wound was then  thoroughly irrigated. No other abnormalities were noted. The tourniquet deflated hemostasis then obtained the subcutaneous tissues were then closed with Monocryl and the skin closed with simple Prolene suture. Adaptic dressing a sterile compressive bandage then applied. Quarter percent Marcaine with epinephrine were injected locally. Patient is then placed in well-padded's splint explained taken recovery room in good condition.  POSTOPERATIVE PLAN: Patient is be discharged to home seen back in the office in approximately one week for wound check and likely back into the splint until the sutures, out. Follow him closely given the large space of the bursa and potential fluid accumulation for the dead space. No radiographs the first visit.

## 2017-05-06 NOTE — Anesthesia Preprocedure Evaluation (Addendum)
Anesthesia Evaluation  Patient identified by MRN, date of birth, ID band Patient awake    Reviewed: Allergy & Precautions, NPO status , Patient's Chart, lab work & pertinent test results  Airway Mallampati: II  TM Distance: >3 FB Neck ROM: Full    Dental no notable dental hx. (+) Edentulous Upper, Edentulous Lower   Pulmonary Current Smoker,    Pulmonary exam normal breath sounds clear to auscultation       Cardiovascular negative cardio ROS Normal cardiovascular exam Rhythm:Regular Rate:Normal     Neuro/Psych Anxiety negative neurological ROS  negative psych ROS   GI/Hepatic Neg liver ROS, GERD  Medicated,  Endo/Other  negative endocrine ROS  Renal/GU negative Renal ROS  negative genitourinary   Musculoskeletal negative musculoskeletal ROS (+)   Abdominal   Peds negative pediatric ROS (+)  Hematology negative hematology ROS (+)   Anesthesia Other Findings   Reproductive/Obstetrics negative OB ROS                           Anesthesia Physical Anesthesia Plan  ASA: II  Anesthesia Plan: General   Post-op Pain Management:    Induction: Intravenous  PONV Risk Score and Plan:   Airway Management Planned: LMA and Oral ETT  Additional Equipment:   Intra-op Plan:   Post-operative Plan: Extubation in OR  Informed Consent: I have reviewed the patients History and Physical, chart, labs and discussed the procedure including the risks, benefits and alternatives for the proposed anesthesia with the patient or authorized representative who has indicated his/her understanding and acceptance.   Dental advisory given  Plan Discussed with: CRNA and Surgeon  Anesthesia Plan Comments:         Anesthesia Quick Evaluation

## 2017-05-06 NOTE — Transfer of Care (Signed)
Immediate Anesthesia Transfer of Care Note  Patient: Ry Moody Pereyra  Procedure(s) Performed: Procedure(s): LEFT ELBOW OLECRANON BURSA EXCISION (Left)  Patient Location: PACU  Anesthesia Type:General  Level of Consciousness: awake, alert , oriented and patient cooperative  Airway & Oxygen Therapy: Patient Spontanous Breathing and Patient connected to nasal cannula oxygen  Post-op Assessment: Report given to RN and Post -op Vital signs reviewed and stable  Post vital signs: Reviewed and stable  Last Vitals:  Vitals:   05/06/17 1126  BP: (!) 143/91  Pulse: 70  Resp: 16  Temp: 36.4 C    Last Pain:  Vitals:   05/06/17 1126  TempSrc: Oral      Patients Stated Pain Goal: 9 (31/67/42 5525)  Complications: No apparent anesthesia complications

## 2017-05-06 NOTE — Anesthesia Procedure Notes (Signed)
Procedure Name: LMA Insertion Date/Time: 05/06/2017 1:53 PM Performed by: Wanita Chamberlain Pre-anesthesia Checklist: Patient identified, Timeout performed, Emergency Drugs available, Suction available and Patient being monitored Patient Re-evaluated:Patient Re-evaluated prior to induction Oxygen Delivery Method: Circle system utilized Preoxygenation: Pre-oxygenation with 100% oxygen Induction Type: IV induction Ventilation: Mask ventilation without difficulty LMA: LMA inserted LMA Size: 4.0 Number of attempts: 1 Placement Confirmation: breath sounds checked- equal and bilateral and positive ETCO2 Tube secured with: Tape Dental Injury: Teeth and Oropharynx as per pre-operative assessment

## 2017-05-06 NOTE — Discharge Instructions (Signed)
KEEP BANDAGE CLEAN AND DRY CALL OFFICE FOR F/U APPT [phone] in 7 days KEEP HAND ELEVATED ABOVE HEART OK TO APPLY ICE TO OPERATIVE AREA CONTACT OFFICE IF ANY WORSENING PAIN OR CONCERNS.        HAND SURGERY    HOME CARE INSTRUCTIONS    The following instructions have been prepared to help you care for yourself upon your return home today.  Wound Care:  Keep your hand elevated above the level of your heart. Do not allow it to dangle by your side. Keep the dressing dry and do not remove it unless your doctor advises you to do so. He will usually change it at the time of you post-op visit. Moving your fingers is advised to stimulate circulation but will depend on the site of your surgery. Of course, if you have a splint applied your doctor will advise you about movement.  Activity:  Do not drive or operate machinery today. Rest today and then you may return to your normal activity and work as indicated by your physician.  Diet: Drink liquids today or eat a light diet. You may resume a regular diet tomorrow.  General expectations: Pain for two or three days. Fingers may become slightly swollen.   Unexpected Observations- Call your doctor if any of these occur: Severe pain not relieved by pain medication. Elevated temperature. Dressing soaked with blood. Inability to move fingers. White or bluish color to fingers.  Post Anesthesia Home Care Instructions  Activity: Get plenty of rest for the remainder of the day. A responsible individual must stay with you for 24 hours following the procedure.  For the next 24 hours, DO NOT: -Drive a car -Paediatric nurse -Drink alcoholic beverages -Take any medication unless instructed by your physician -Make any legal decisions or sign important papers.  Meals: Start with liquid foods such as gelatin or soup. Progress to regular foods as tolerated. Avoid greasy, spicy, heavy foods. If nausea and/or vomiting occur, drink only clear liquids  until the nausea and/or vomiting subsides. Call your physician if vomiting continues.  Special Instructions/Symptoms: Your throat may feel dry or sore from the anesthesia or the breathing tube placed in your throat during surgery. If this causes discomfort, gargle with warm salt water. The discomfort should disappear within 24 hours.  If you had a scopolamine patch placed behind your ear for the management of post- operative nausea and/or vomiting:  1. The medication in the patch is effective for 72 hours, after which it should be removed.  Wrap patch in a tissue and discard in the trash. Wash hands thoroughly with soap and water. 2. You may remove the patch earlier than 72 hours if you experience unpleasant side effects which may include dry mouth, dizziness or visual disturbances. 3. Avoid touching the patch. Wash your hands with soap and water after contact with the patch.

## 2017-05-07 ENCOUNTER — Encounter (HOSPITAL_BASED_OUTPATIENT_CLINIC_OR_DEPARTMENT_OTHER): Payer: Self-pay | Admitting: Orthopedic Surgery

## 2017-05-07 LAB — POCT HEMOGLOBIN-HEMACUE: HEMOGLOBIN: 15.8 g/dL (ref 13.0–17.0)

## 2017-05-10 NOTE — Anesthesia Postprocedure Evaluation (Signed)
Anesthesia Post Note  Patient: Matthew [name]  Procedure(s) Performed: Procedure(s) (LRB): LEFT ELBOW OLECRANON BURSA EXCISION (Left)     Patient location during evaluation: PACU Anesthesia Type: General Level of consciousness: awake and alert Pain management: pain level controlled Vital Signs Assessment: post-procedure vital signs reviewed and stable Respiratory status: spontaneous breathing, nonlabored ventilation, respiratory function stable and patient connected to nasal cannula oxygen Cardiovascular status: blood pressure returned to baseline and stable Postop Assessment: no signs of nausea or vomiting Anesthetic complications: no    Last Vitals:  Vitals:   05/06/17 1530 05/06/17 1615  BP: 102/68 132/79  Pulse: 67 (!) 56  Resp: 16 16  Temp:  36.6 C    Last Pain:  Vitals:   05/06/17 1600  TempSrc:   PainSc: 6                  Adron Geisel S

## 2019-11-14 ENCOUNTER — Encounter (HOSPITAL_COMMUNITY): Payer: Self-pay

## 2019-11-14 ENCOUNTER — Encounter (HOSPITAL_COMMUNITY): Payer: Self-pay | Admitting: Emergency Medicine

## 2019-11-14 ENCOUNTER — Emergency Department (HOSPITAL_COMMUNITY)
Admission: EM | Admit: 2019-11-14 | Discharge: 2019-11-14 | Disposition: A | Discharge status: Home / Self Care | Payer: 59 | Attending: Emergency Medicine | Admitting: Emergency Medicine

## 2019-11-14 ENCOUNTER — Other Ambulatory Visit: Payer: Self-pay

## 2019-11-14 DIAGNOSIS — R040 Epistaxis: Secondary | ICD-10-CM | POA: Insufficient documentation

## 2019-11-14 DIAGNOSIS — K920 Hematemesis: Secondary | ICD-10-CM | POA: Insufficient documentation

## 2019-11-14 DIAGNOSIS — Z7982 Long term (current) use of aspirin: Secondary | ICD-10-CM | POA: Insufficient documentation

## 2019-11-14 DIAGNOSIS — F17210 Nicotine dependence, cigarettes, uncomplicated: Secondary | ICD-10-CM | POA: Insufficient documentation

## 2019-11-14 DIAGNOSIS — Z5321 Procedure and treatment not carried out due to patient leaving prior to being seen by health care provider: Secondary | ICD-10-CM | POA: Insufficient documentation

## 2019-11-14 DIAGNOSIS — R042 Hemoptysis: Secondary | ICD-10-CM | POA: Insufficient documentation

## 2019-11-14 DIAGNOSIS — Z79899 Other long term (current) drug therapy: Secondary | ICD-10-CM | POA: Insufficient documentation

## 2019-11-14 DIAGNOSIS — I2510 Atherosclerotic heart disease of native coronary artery without angina pectoris: Secondary | ICD-10-CM | POA: Insufficient documentation

## 2019-11-14 MED ORDER — AMOXICILLIN 500 MG PO CAPS: 500.0000 mg | ORAL_CAPSULE | Freq: Three times a day (TID) | ORAL | 0 refills | Status: DC

## 2019-11-14 MED ORDER — OXYMETAZOLINE HCL 0.05 % NA SOLN
1.0000 | Freq: Once | NASAL | Status: AC
  Administered 2019-11-14: 10:00:00 1 via NASAL
  Filled 2019-11-14: qty 30

## 2019-11-14 NOTE — ED Provider Notes (Signed)
[facility] DEPT Provider Note   CSN: [id number] Arrival date & time: 11/14/19  0848     History Chief Complaint  Patient presents with  . Hemoptysis    Matthew [name] is a 54 y.o. male.  Patient is a 54 year old male with a history of CAD status post stents, and ongoing tobacco abuse who presents today with complaints of nosebleed and coughing up blood.  Symptoms started yesterday he had 3 or 4 separate episodes.  He had a several hour episode throughout the night where he was coughing up and blowing his nose with large amounts of bright red blood.  It stopped about 4 AM.  It was coming out of both sides of his nose and down the back of his throat.  He denies any shortness of breath but does report that in the last week or so he has had significant sinus congestion.  He does have a prior history as a kid of having frequent nosebleeds and cauterizations.  He denies any facial pain at this time but still complains of mild congestion.  He has had no fever or shortness of breath.  The history is provided by the patient.       Past Medical History:  Diagnosis Date  . Coronary artery disease per pt last cardiology visit 2011 (found documentation 12-05-2009 w/ dr Leonia Reeves with [facility] in epic)/  currently pt is followed by pcp   12-28-2008  PCI and DES x2 to proximal and mid RCA/ last cardiac cath 12-09-2009  widely patent stents  . GERD (gastroesophageal reflux disease)   . Hyperlipidemia   . Olecranon bursitis of left elbow   . S/P right coronary artery (RCA) stent placement 12/28/2008   PCI and DES x2-- proximal and mid RCA  . Wears dentures    UPPER    There are no problems to display for this patient.   Past Surgical History:  Procedure Laterality Date  . CARDIAC CATHETERIZATION  12-09-2009  dr Ilda Foil   abnormal stress test (12-05-2009) widely patent coronaries present w/ evidence of diffuse atherosclerotic disease/  widely patent stented  segments in the proximal and mid segment RCA/  LVEF 65%  . CORONARY ANGIOPLASTY WITH STENT PLACEMENT  12-28-2008  dr berry   PCI and stenting of the proxmial and mid RCA using cutting balloon atherectomy (x2 DES, Xience)/  LVEF >60% w/ normal wall motion  . KNEE ARTHROSCOPY W/ MENISCECTOMY Right 2002  . NEUROPLASTY / TRANSPOSITION ULNAR NERVE AT ELBOW Right 10/2014   dr Caralyn Guile  . OLECRANON BURSECTOMY Left 05/06/2017   Procedure: LEFT ELBOW OLECRANON BURSA EXCISION;  Surgeon: Iran Planas, MD;  Location: [facility];  Service: Orthopedics;  Laterality: Left;  . ORIF RIGHT ANKLE FX  2006  . TONSILLECTOMY  age 89  . TRANSTHORACIC ECHOCARDIOGRAM  03/30/2016   mild concentric LVH, ef 50-55%/  trivial TR       Family History  Problem Relation Age of Onset  . Cancer Mother   . Cancer Father     Social History   Tobacco Use  . Smoking status: Current Every Day Smoker    Packs/day: 2.00    Years: 39.00    Pack years: 78.00    Types: Cigarettes  . Smokeless tobacco: Never Used  . Tobacco comment: since age 54  Substance Use Topics  . Alcohol use: Yes    Comment: seldom  . Drug use: No    Home Medications Prior to Admission medications  Medication Sig Start Date End Date Taking? Authorizing Provider  amoxicillin (AMOXIL) 500 MG capsule Take 1 capsule (500 mg total) by mouth 3 (three) times daily. 11/14/19   Blanchie Dessert, MD  aspirin EC 325 MG tablet Take 325 mg by mouth every other day.    [provider]  ranitidine (ZANTAC) 150 MG tablet Take 150 mg by mouth as needed for heartburn.    [provider]    Allergies    Codeine and Onion  Review of Systems   Review of Systems  All other systems reviewed and are negative.   Physical Exam Updated Vital Signs BP (!) 154/105 (BP Location: Left Arm)   Pulse 77   Temp 98.1 F (36.7 C) (Oral)   Resp 18   Ht 5\' 11"  (1.803 m)   Wt 108.9 kg   SpO2 98%   BMI 33.47 kg/m   Physical  Exam Vitals and nursing note reviewed.  Constitutional:      General: He is not in acute distress.    Appearance: Normal appearance. He is obese.  HENT:     Head: Normocephalic.     Comments: Minimal dried blood in bilateral nares but no active epistaxis at this time    Right Ear: Tympanic membrane normal.     Left Ear: Tympanic membrane normal.     Nose: Mucosal edema and congestion present.     Mouth/Throat:     Comments: No posterior pharynx bleeding Eyes:     Pupils: Pupils are equal, round, and reactive to light.  Cardiovascular:     Rate and Rhythm: Normal rate.  Pulmonary:     Effort: Pulmonary effort is normal.  Skin:    General: Skin is warm.  Neurological:     General: No focal deficit present.     Mental Status: He is alert and oriented to person, place, and time. Mental status is at baseline.  Psychiatric:        Mood and Affect: Mood normal.        Behavior: Behavior normal.        Thought Content: Thought content normal.     ED Results / Procedures / Treatments   Labs (all labs ordered are listed, but only abnormal results are displayed) Labs Reviewed - No data to display  EKG None  Radiology No results found.  Procedures Procedures (including critical care time)  Medications Ordered in ED Medications  oxymetazoline (AFRIN) 0.05 % nasal spray 1 spray (has no administration in time range)    ED Course  I have reviewed the triage vital signs and the nursing notes.  Pertinent labs & imaging results that were available during my care of the patient were reviewed by me and considered in my medical decision making (see chart for details).    MDM Rules/Calculators/A&P                      54 year old male presenting initially with a complaint of hemoptysis however with further history patient was also having a nosebleed at the time.  This started yesterday and has had 3 episodes since yesterday.  He is currently no longer bleeding.  Suspect posterior  bleed as he had blood coming from both nares and on inspection there is no localized vessel that could have been bleeding.  Low suspicion for pulmonary hemorrhage or an acute lung pathology at this time.  Patient does not take anticoagulation.  He has been having sinus symptoms and suspect  that is most likely the cause of the bleed.  Patient will use Afrin, Ocean nasal spray and Benadryl to help with the congestion.  Covered with amoxicillin in case for sinus infection.  Given follow-up with ENT if his symptoms were to become more frequent.  Final Clinical Impression(s) / ED Diagnoses Final diagnoses:  Posterior epistaxis    Rx / DC Orders ED Discharge Orders         Ordered    amoxicillin (AMOXIL) 500 MG capsule  3 times daily     11/14/19 0945           Blanchie Dessert, MD 11/14/19 [phone]

## 2019-11-14 NOTE — ED Triage Notes (Signed)
Patient states he has been coughing up bright red blood that started last night. Patient reports 3 episodes.

## 2019-11-14 NOTE — ED Triage Notes (Signed)
Pt reports having an episode of coughing then noticed blood in sputum. Pt also reports that's when blowing nose there was bright red blood. Pt denies any other symptoms. Pt in no acute distress.

## 2019-11-14 NOTE — Discharge Instructions (Signed)
Take benadryl to help dry up your sinuses and abx for infection.  Use the afrin nasal spray 2 times a day for the next 3 days.  You can also use saline spray throughout the day if you nose is getting dry

## 2019-11-16 ENCOUNTER — Other Ambulatory Visit: Payer: Self-pay | Admitting: Otolaryngology

## 2019-11-16 ENCOUNTER — Ambulatory Visit
Admission: RE | Admit: 2019-11-16 | Discharge: 2019-11-16 | Disposition: A | Discharge status: Home / Self Care | Payer: No Typology Code available for payment source | Source: Ambulatory Visit | Attending: Otolaryngology | Admitting: Otolaryngology

## 2019-11-16 ENCOUNTER — Other Ambulatory Visit: Payer: Self-pay

## 2019-11-16 DIAGNOSIS — R059 Cough, unspecified: Secondary | ICD-10-CM

## 2019-11-16 DIAGNOSIS — R05 Cough: Secondary | ICD-10-CM

## 2019-11-17 ENCOUNTER — Emergency Department (HOSPITAL_COMMUNITY): Payer: No Typology Code available for payment source | Admitting: Certified Registered"

## 2019-11-17 ENCOUNTER — Encounter (HOSPITAL_COMMUNITY)
Admission: EM | Disposition: A | Discharge status: Home / Self Care | Payer: Self-pay | Source: Home / Self Care | Attending: Pulmonary Disease

## 2019-11-17 ENCOUNTER — Inpatient Hospital Stay (HOSPITAL_COMMUNITY): Payer: No Typology Code available for payment source

## 2019-11-17 ENCOUNTER — Encounter (HOSPITAL_COMMUNITY): Payer: Self-pay | Admitting: Certified Registered"

## 2019-11-17 ENCOUNTER — Inpatient Hospital Stay (HOSPITAL_COMMUNITY)
Admission: EM | Admit: 2019-11-17 | Discharge: 2019-11-20 | DRG: 377 | Disposition: A | Discharge status: Home / Self Care | Payer: Self-pay | Attending: Pulmonary Disease | Admitting: Pulmonary Disease

## 2019-11-17 ENCOUNTER — Emergency Department (HOSPITAL_COMMUNITY): Payer: No Typology Code available for payment source

## 2019-11-17 DIAGNOSIS — I2510 Atherosclerotic heart disease of native coronary artery without angina pectoris: Secondary | ICD-10-CM | POA: Diagnosis present

## 2019-11-17 DIAGNOSIS — K449 Diaphragmatic hernia without obstruction or gangrene: Secondary | ICD-10-CM | POA: Diagnosis present

## 2019-11-17 DIAGNOSIS — Z91018 Allergy to other foods: Secondary | ICD-10-CM

## 2019-11-17 DIAGNOSIS — R58 Hemorrhage, not elsewhere classified: Secondary | ICD-10-CM

## 2019-11-17 DIAGNOSIS — F17210 Nicotine dependence, cigarettes, uncomplicated: Secondary | ICD-10-CM | POA: Diagnosis present

## 2019-11-17 DIAGNOSIS — K219 Gastro-esophageal reflux disease without esophagitis: Secondary | ICD-10-CM | POA: Diagnosis present

## 2019-11-17 DIAGNOSIS — Z4659 Encounter for fitting and adjustment of other gastrointestinal appliance and device: Secondary | ICD-10-CM

## 2019-11-17 DIAGNOSIS — J9690 Respiratory failure, unspecified, unspecified whether with hypoxia or hypercapnia: Secondary | ICD-10-CM

## 2019-11-17 DIAGNOSIS — Z801 Family history of malignant neoplasm of trachea, bronchus and lung: Secondary | ICD-10-CM

## 2019-11-17 DIAGNOSIS — Z955 Presence of coronary angioplasty implant and graft: Secondary | ICD-10-CM

## 2019-11-17 DIAGNOSIS — R339 Retention of urine, unspecified: Secondary | ICD-10-CM | POA: Diagnosis not present

## 2019-11-17 DIAGNOSIS — K920 Hematemesis: Principal | ICD-10-CM | POA: Diagnosis present

## 2019-11-17 DIAGNOSIS — J9602 Acute respiratory failure with hypercapnia: Secondary | ICD-10-CM | POA: Diagnosis present

## 2019-11-17 DIAGNOSIS — E785 Hyperlipidemia, unspecified: Secondary | ICD-10-CM | POA: Diagnosis present

## 2019-11-17 DIAGNOSIS — R042 Hemoptysis: Secondary | ICD-10-CM | POA: Diagnosis present

## 2019-11-17 DIAGNOSIS — Z20822 Contact with and (suspected) exposure to covid-19: Secondary | ICD-10-CM | POA: Diagnosis present

## 2019-11-17 DIAGNOSIS — J439 Emphysema, unspecified: Secondary | ICD-10-CM | POA: Diagnosis present

## 2019-11-17 DIAGNOSIS — Z7982 Long term (current) use of aspirin: Secondary | ICD-10-CM

## 2019-11-17 DIAGNOSIS — Z885 Allergy status to narcotic agent status: Secondary | ICD-10-CM

## 2019-11-17 HISTORY — PX: ESOPHAGOGASTRODUODENOSCOPY: SHX5428

## 2019-11-17 LAB — COMPREHENSIVE METABOLIC PANEL
ALT: 28 U/L (ref 0–44)
AST: 23 U/L (ref 15–41)
Albumin: 4.4 g/dL (ref 3.5–5.0)
Alkaline Phosphatase: 93 U/L (ref 38–126)
Anion gap: 10 (ref 5–15)
BUN: 15 mg/dL (ref 6–20)
CO2: 22 mmol/L (ref 22–32)
Calcium: 8.8 mg/dL — ABNORMAL LOW (ref 8.9–10.3)
Chloride: 100 mmol/L (ref 98–111)
Creatinine, Ser: 1.04 mg/dL (ref 0.61–1.24)
GFR calc Af Amer: >60 mL/min (ref >60)
GFR calc non Af Amer: >60 mL/min (ref >60)
Glucose, Bld: 112 mg/dL — ABNORMAL HIGH (ref 70–99)
Potassium: 3.8 mmol/L (ref 3.5–5.1)
Sodium: 132 mmol/L — ABNORMAL LOW (ref 135–145)
Total Bilirubin: 0.6 mg/dL (ref 0.3–1.2)
Total Protein: 7.9 g/dL (ref 6.5–8.1)

## 2019-11-17 LAB — BLOOD GAS, ARTERIAL
Acid-base deficit: 0.8 mmol/L (ref 0.0–2.0)
Bicarbonate: 25.3 mmol/L (ref 20.0–28.0)
O2 Saturation: 97.5 %
Patient temperature: 97.7
pCO2 arterial: 50.9 mmHg — ABNORMAL HIGH (ref 32.0–48.0)
pH, Arterial: 7.317 — ABNORMAL LOW (ref 7.350–7.450)
pO2, Arterial: 109 mmHg — ABNORMAL HIGH (ref 83.0–108.0)

## 2019-11-17 LAB — CBC
HCT: 43.5 % (ref 39.0–52.0)
Hemoglobin: 14.9 g/dL (ref 13.0–17.0)
MCH: 31.9 pg (ref 26.0–34.0)
MCHC: 34.3 g/dL (ref 30.0–36.0)
MCV: 93.1 fL (ref 80.0–100.0)
Platelets: 210 10*3/uL (ref 150–400)
RBC: 4.67 MIL/uL (ref 4.22–5.81)
RDW: 12.8 % (ref 11.5–15.5)
WBC: 10.6 10*3/uL — ABNORMAL HIGH (ref 4.0–10.5)
nRBC: 0 % (ref 0.0–0.2)

## 2019-11-17 LAB — TYPE AND SCREEN
ABO/RH(D): O POS
Antibody Screen: NEGATIVE

## 2019-11-17 LAB — ABO/RH: ABO/RH(D): O POS

## 2019-11-17 LAB — RESPIRATORY PANEL BY RT PCR (FLU A&B, COVID)
Influenza A by PCR: NEGATIVE
Influenza B by PCR: NEGATIVE
SARS Coronavirus 2 by RT PCR: NEGATIVE

## 2019-11-17 LAB — PHOSPHORUS: Phosphorus: 4.1 mg/dL (ref 2.5–4.6)

## 2019-11-17 LAB — POC OCCULT BLOOD, ED: Fecal Occult Bld: POSITIVE — AB

## 2019-11-17 LAB — MRSA PCR SCREENING: MRSA by PCR: NEGATIVE

## 2019-11-17 LAB — TRIGLYCERIDES: Triglycerides: 296 mg/dL — ABNORMAL HIGH (ref <150)

## 2019-11-17 LAB — HIV ANTIBODY (ROUTINE TESTING W REFLEX): HIV Screen 4th Generation wRfx: NONREACTIVE

## 2019-11-17 LAB — GLUCOSE, CAPILLARY
Glucose-Capillary: 101 mg/dL — ABNORMAL HIGH (ref 70–99)
Glucose-Capillary: 97 mg/dL (ref 70–99)

## 2019-11-17 LAB — MAGNESIUM: Magnesium: 2.3 mg/dL (ref 1.7–2.4)

## 2019-11-17 SURGERY — ESOPHAGOGASTRODUODENOSCOPY (EGD) WITH PROPOFOL
Anesthesia: Monitor Anesthesia Care

## 2019-11-17 SURGERY — EGD (ESOPHAGOGASTRODUODENOSCOPY)
Anesthesia: Monitor Anesthesia Care

## 2019-11-17 MED ORDER — PROPOFOL 10 MG/ML IV BOLUS
INTRAVENOUS | Status: AC
  Filled 2019-11-17: qty 40

## 2019-11-17 MED ORDER — ROCURONIUM BROMIDE 100 MG/10ML IV SOLN
INTRAVENOUS | Status: DC | PRN
  Administered 2019-11-17: 40 mg via INTRAVENOUS

## 2019-11-17 MED ORDER — PROPOFOL 500 MG/50ML IV EMUL
INTRAVENOUS | Status: AC
  Filled 2019-11-17: qty 100

## 2019-11-17 MED ORDER — SODIUM CHLORIDE (PF) 0.9 % IJ SOLN
INTRAMUSCULAR | Status: AC
  Filled 2019-11-17: qty 50

## 2019-11-17 MED ORDER — FENTANYL CITRATE (PF) 100 MCG/2ML IJ SOLN
INTRAMUSCULAR | Status: DC | PRN
  Administered 2019-11-17 (×2): 100 ug via INTRAVENOUS

## 2019-11-17 MED ORDER — HEPARIN SODIUM (PORCINE) 5000 UNIT/ML IJ SOLN
5000.0000 [IU] | Freq: Three times a day (TID) | INTRAMUSCULAR | Status: DC
  Administered 2019-11-17 – 2019-11-18 (×2): 5000 [IU] via SUBCUTANEOUS
  Filled 2019-11-17 (×2): qty 1

## 2019-11-17 MED ORDER — FENTANYL CITRATE (PF) 100 MCG/2ML IJ SOLN
INTRAMUSCULAR | Status: AC
  Filled 2019-11-17: qty 2

## 2019-11-17 MED ORDER — PROPOFOL 10 MG/ML IV BOLUS
INTRAVENOUS | Status: DC | PRN
  Administered 2019-11-17: 200 mg via INTRAVENOUS

## 2019-11-17 MED ORDER — LACTATED RINGERS IV SOLN
INTRAVENOUS | Status: AC | PRN
  Administered 2019-11-17: 1000 mL via INTRAVENOUS

## 2019-11-17 MED ORDER — VITAL HIGH PROTEIN PO LIQD: 1000.0000 mL | ORAL | Status: DC

## 2019-11-17 MED ORDER — PROPOFOL 1000 MG/100ML IV EMUL
INTRAVENOUS | Status: AC
  Filled 2019-11-17: qty 300

## 2019-11-17 MED ORDER — PROPOFOL 500 MG/50ML IV EMUL
INTRAVENOUS | Status: DC | PRN
  Administered 2019-11-17: 100 ug/kg/min via INTRAVENOUS

## 2019-11-17 MED ORDER — MIDAZOLAM HCL 2 MG/2ML IJ SOLN
INTRAMUSCULAR | Status: AC
  Filled 2019-11-17: qty 2

## 2019-11-17 MED ORDER — SODIUM CHLORIDE 0.9 % IV SOLN: INTRAVENOUS | Status: DC

## 2019-11-17 MED ORDER — SUCCINYLCHOLINE CHLORIDE 20 MG/ML IJ SOLN
INTRAMUSCULAR | Status: DC | PRN
  Administered 2019-11-17: 120 mg via INTRAVENOUS

## 2019-11-17 MED ORDER — SODIUM CHLORIDE 0.9 % IN NEBU: 3.0000 mL | INHALATION_SOLUTION | Freq: Three times a day (TID) | RESPIRATORY_TRACT | Status: DC

## 2019-11-17 MED ORDER — FENTANYL 2500MCG IN NS 250ML (10MCG/ML) PREMIX INFUSION
0.0000 ug/h | INTRAVENOUS | Status: DC
  Administered 2019-11-17: 19:00:00 25 ug/h via INTRAVENOUS
  Administered 2019-11-18: 19:00:00 200 ug/h via INTRAVENOUS
  Administered 2019-11-18: 50 ug/h via INTRAVENOUS
  Administered 2019-11-19: 06:00:00 250 ug/h via INTRAVENOUS
  Filled 2019-11-17 (×4): qty 250

## 2019-11-17 MED ORDER — MIDAZOLAM HCL 2 MG/2ML IJ SOLN
INTRAMUSCULAR | Status: DC | PRN
  Administered 2019-11-17: 2 mg via INTRAVENOUS

## 2019-11-17 MED ORDER — ORAL CARE MOUTH RINSE
15.0000 mL | OROMUCOSAL | Status: DC
  Administered 2019-11-17 – 2019-11-20 (×16): 15 mL via OROMUCOSAL

## 2019-11-17 MED ORDER — PROPOFOL 1000 MG/100ML IV EMUL
INTRAVENOUS | Status: AC
  Administered 2019-11-17: 18:00:00 80 ug/kg/min via INTRAVENOUS
  Filled 2019-11-17: qty 100

## 2019-11-17 MED ORDER — PROPOFOL 1000 MG/100ML IV EMUL: 5.0000 ug/kg/min | INTRAVENOUS | Status: DC

## 2019-11-17 MED ORDER — ALBUTEROL SULFATE HFA 108 (90 BASE) MCG/ACT IN AERS
INHALATION_SPRAY | RESPIRATORY_TRACT | Status: DC | PRN
  Administered 2019-11-17 (×2): 2 via RESPIRATORY_TRACT

## 2019-11-17 MED ORDER — TRANEXAMIC ACID 1000 MG/10ML IV SOLN
500.0000 mg | Freq: Three times a day (TID) | INTRAVENOUS | Status: DC
  Administered 2019-11-17 – 2019-11-20 (×7): 500 mg via TOPICAL
  Filled 2019-11-17 (×10): qty 10

## 2019-11-17 MED ORDER — PRO-STAT SUGAR FREE PO LIQD: 30.0000 mL | Freq: Two times a day (BID) | ORAL | Status: DC

## 2019-11-17 MED ORDER — IOHEXOL 300 MG/ML  SOLN
75.0000 mL | Freq: Once | INTRAMUSCULAR | Status: AC | PRN
  Administered 2019-11-17: 18:00:00 75 mL via INTRAVENOUS

## 2019-11-17 MED ORDER — LACTATED RINGERS IV SOLN: INTRAVENOUS | Status: DC

## 2019-11-17 MED ORDER — CHLORHEXIDINE GLUCONATE CLOTH 2 % EX PADS
6.0000 | MEDICATED_PAD | Freq: Every day | CUTANEOUS | Status: DC
  Administered 2019-11-17 – 2019-11-19 (×3): 6 via TOPICAL

## 2019-11-17 MED ORDER — CHLORHEXIDINE GLUCONATE 0.12% ORAL RINSE (MEDLINE KIT)
15.0000 mL | Freq: Two times a day (BID) | OROMUCOSAL | Status: DC
  Administered 2019-11-17 – 2019-11-19 (×6): 15 mL via OROMUCOSAL

## 2019-11-17 MED ORDER — DEXMEDETOMIDINE HCL IN NACL 200 MCG/50ML IV SOLN
0.4000 ug/kg/h | INTRAVENOUS | Status: DC
  Administered 2019-11-17: 22:00:00 0.6 ug/kg/h via INTRAVENOUS
  Filled 2019-11-17: qty 50

## 2019-11-17 MED ORDER — PANTOPRAZOLE SODIUM 40 MG IV SOLR
40.0000 mg | Freq: Two times a day (BID) | INTRAVENOUS | Status: DC
  Administered 2019-11-17 – 2019-11-20 (×6): 40 mg via INTRAVENOUS
  Filled 2019-11-17 (×6): qty 40

## 2019-11-17 MED ORDER — PROPOFOL 1000 MG/100ML IV EMUL
INTRAVENOUS | Status: AC
  Administered 2019-11-17: 17:00:00 80 ug/kg/min
  Filled 2019-11-17: qty 100

## 2019-11-17 MED ORDER — PHENYLEPHRINE HCL (PRESSORS) 10 MG/ML IV SOLN
INTRAVENOUS | Status: DC | PRN
  Administered 2019-11-17: 80 ug via INTRAVENOUS

## 2019-11-17 NOTE — Progress Notes (Signed)
eLink Physician-Brief Progress Note Patient Name: LORIMER TAKASHIMA DOB: 30-Apr-1966 MRN: [id number]   Date of Service  11/17/2019  HPI/Events of Note  Notified of elevated triglycerides and hypotension on Propofol. Also on Fentanyl now at 25 mcg. Request for Precedex  eICU Interventions  BP currently 100/70  HR 69 with decreased Propofol dose Discussed with bedside RN to try titrating up Fentanyl and titrate Propofol to off. Will order Precedex and to start if additional sedation is needed but will need to monitor HR     Intervention Category Major Interventions: Hypotension - evaluation and management  Judd Lien 11/17/2019, 9:13 PM

## 2019-11-17 NOTE — Procedures (Signed)
Bronchoscopy Procedure Note Matthew [name] [id number] 12-10-1965  Procedure: Bronchoscopy Indications: hemoptysis  Procedure Details Consent: Risks of procedure as well as the alternatives and risks of each were explained to the (patient/caregiver).  Consent for procedure obtained.pts spouse Time Out: Verified patient identification, verified procedure, site/side was marked, verified correct patient position, special equipment/implants available, medications/allergies/relevent history reviewed, required imaging and test results available.  Performed  In preparation for procedure, bronchoscope lubricated. Sedation: propofol  Airway entered and the following bronchi were examined: RUL, RML, RLL, LUL, LLL and Bronchi.   Right lung was examined right upper lobe, right middle lobe right lower lobe bronchi were all visualized noted to be free of lesions, once adequately suctioned of blood with instillation of saline, no significant bleeding noted. Bronchoscope was withdrawn and introduced to the left lung, a clot was noted in the left upper lobe-clot could not be aspirated Bronchoscope advanced to the left lower lobe where no significant bleeding was noted, following clearance of initial blood that was noted About 60 cc of saline was instilled around the left upper lobe and suctioned  Clot was noted to be adherent, there was no evidence of active bleeding  Procedures performed: Left upper lobe washing Bronchoscope removed.    Evaluation Hemodynamic Status: BP stable throughout; O2 sats: stable throughout Patient's Current Condition: stable Specimens:  Sent serosanguinous fluid- washings from LUL Complications: No apparent complications Patient did tolerate procedure well.  Postoperative diagnosis clot in the left upper lobe  Matthew [name] 11/17/2019

## 2019-11-17 NOTE — Transfer of Care (Signed)
Immediate Anesthesia Transfer of Care Note  Patient: Matthew [name]  Procedure(s) Performed: ESOPHAGOGASTRODUODENOSCOPY (EGD) (N/A )  Patient Location: PACU and Endoscopy Unit  Anesthesia Type:General  Level of Consciousness: unresponsive  Airway & Oxygen Therapy: Pt intubated   Post-op Assessment: Report given to RN and Post -op Vital signs reviewed and stable  Post vital signs: Reviewed and stable  Last Vitals:  Vitals Value Taken Time  BP    Temp    Pulse    Resp    SpO2      Last Pain:  Vitals:   11/17/19 1418  TempSrc: Oral  PainSc:          Complications: No apparent anesthesia complications

## 2019-11-17 NOTE — Op Note (Addendum)
[facility] Patient Name: Matthew [name] Procedure Date: 11/17/2019 MRN: [id number] Attending MD: Carol Ada , MD Date of Birth: September 05, 1966 CSN: [id number] Age: 54 Admit Type: Outpatient Procedure:                Upper GI endoscopy Indications:              Hematemesis Providers:                Carol Ada, MD, Cleda Daub, RN, Cherylynn Ridges,                            Technician, Corie Chiquito, Technician, Glenis Smoker, CRNA Referring MD:              Medicines:                Propofol per Anesthesia Complications:            No immediate complications. Estimated Blood Loss:     Estimated blood loss: Procedure:                Pre-Anesthesia Assessment:                           - Prior to the procedure, a History and Physical                            was performed, and patient medications and                            allergies were reviewed. The patient's tolerance of                            previous anesthesia was also reviewed. The risks                            and benefits of the procedure and the sedation                            options and risks were discussed with the patient.                            All questions were answered, and informed consent                            was obtained. Prior Anticoagulants: The patient has                            taken no previous anticoagulant or antiplatelet                            agents. ASA Grade Assessment: III - A patient with                            severe systemic disease. After  reviewing the risks                            and benefits, the patient was deemed in                            satisfactory condition to undergo the procedure.                           - Sedation was administered by an anesthesia                            professional. Deep sedation was attained.                           After obtaining informed consent, the endoscope was                         passed under direct vision. Throughout the                            procedure, the patient's blood pressure, pulse, and                            oxygen saturations were monitored continuously. The                            [id number]) Olympus gastroscope was                            introduced through the mouth, and advanced to the                            second part of duodenum. The upper GI endoscopy was                            accomplished without difficulty. The patient                            tolerated the procedure well. Scope In: Scope Out: Findings:      A 3 cm hiatal hernia was present.      The stomach was normal.      The examined duodenum was normal.      At the start of the procedure close evaluation of the vocal cords was       performed and there was scant evidence of some old blood, which would be       consistent with his complaints of "coughing up" blood. In the upper GI       tract there was some evidence of hematin, but after lavage there was no       source of bleeding. During the procedure he was coughing and blood       splatter was noted in the facemask. As the patient coughed more there       was evidence of further bleeding. At the termination of the EGD the  vocal cords were quickly viewed and there was active hemoptysis. Dr.       Zenia Resides in the ER was notified of the situation and a Pulmonary       consultation was requested. Anesthesia also recommended to intubate the       patient. The situation was discussed with the patient after he quickly       recovered and he consented to the intubation. A bed in ICU was also       secured. An approximate total of 100 ml of blood was suctioned. Impression:               - 3 cm hiatal hernia.                           - Normal stomach.                           - Normal examined duodenum.                           - No specimens collected. Moderate Sedation:      Not  Applicable - Patient had care per Anesthesia. Recommendation:           - Admit to the hospital.                           - Agree with intubation.                           - Stat Pulmonary consultation. Procedure Code(s):        --- Professional ---                           [phone], Esophagogastroduodenoscopy, flexible,                            transoral; diagnostic, including collection of                            specimen(s) by brushing or washing, when performed                            (separate procedure) Diagnosis Code(s):        --- Professional ---                           K92.0, Hematemesis                           K44.9, Diaphragmatic hernia without obstruction or                            gangrene CPT copyright 2019 [employer] Medical Association. All rights reserved. The codes documented in this report are preliminary and upon coder review may  be revised to meet current compliance requirements. Carol Ada, MD Carol Ada, MD 11/17/2019 3:24:12 PM This report has been signed electronically. Number of Addenda: 0

## 2019-11-17 NOTE — Progress Notes (Addendum)
Two attempts to place OG tube unsuccessful. First attempt, KUB found tube coiled and out of place. Second attempt, patient began having bright red hemoptysis in ETT tube requiring additional suctioning. Consulted RN charge and advised to hold feedings for now and reattempt placement in twelve hours. Will communicate with next shift RN.

## 2019-11-17 NOTE — Anesthesia Procedure Notes (Signed)
Procedure Name: Intubation Date/Time: 11/17/2019 3:07 PM Performed by: Cynda Familia, CRNA Pre-anesthesia Checklist: Patient identified, Emergency Drugs available, Suction available and Patient being monitored Patient Re-evaluated:Patient Re-evaluated prior to induction Oxygen Delivery Method: Circle System Utilized Preoxygenation: Pre-oxygenation with 100% oxygen Induction Type: IV induction Ventilation: Mask ventilation without difficulty Laryngoscope Size: Glidescope Grade View: Grade I Tube type: Oral Number of attempts: 1 Airway Equipment and Method: Stylet and Video-laryngoscopy Placement Confirmation: ETT inserted through vocal cords under direct vision,  positive ETCO2 and breath sounds checked- equal and bilateral Secured at: 22 cm Tube secured with: Tape Dental Injury: Teeth and Oropharynx as per pre-operative assessment  Comments: Smooth IV induction MDA-- intubation AM CRNA atraumatic-- teeth and mouth as preop bilat BS by MDA

## 2019-11-17 NOTE — H&P (Addendum)
NAME:  Matthew [name], MRN:  [id number], DOB:  01-30-1966, LOS: 0 ADMISSION DATE:  11/17/2019, CONSULTATION DATE:  11/17/2019 REFERRING MD: ED doc, CHIEF COMPLAINT: Hemoptysis  Brief History   Vomiting blood since 2/1 Was advised by Dr. Benjamine Mola of ENT to come to the ED, concern for GI bleed Patient taken to the endoscopy suite where he had EGD performed-EGD was unremarkable but was noted to be coughing up blood during the procedure, as the procedure was being concluded he had a significant amount of hemoptysis-about 200 cc of blood He was intubated and PCCM was consulted  Active smoker-about 78-pack-year smoking history Dad had cancer- lung cancer  Was scoped by Dr. Rutherford Guys evidence of oropharyngeal source of bleeding -History of nosebleeds as a child, some that required cauterization  Past Medical History:  Diagnosis Date  . Coronary artery disease per pt last cardiology visit 2011 (found documentation 12-05-2009 w/ dr Leonia Reeves with [facility] in epic)/  currently pt is followed by pcp   12-28-2008  PCI and DES x2 to proximal and mid RCA/ last cardiac cath 12-09-2009  widely patent stents  . GERD (gastroesophageal reflux disease)   . Hyperlipidemia   . Olecranon bursitis of left elbow   . S/P right coronary artery (RCA) stent placement 12/28/2008   PCI and DES x2-- proximal and mid RCA  . Wears dentures    UPPER     Significant Hospital Events   EGD 2/5-unremarkable Bronchoscopy 2/5-clot in the left upper lobe  Consults:  PCCM GI  Procedures:  Bronchoscopy EGD  Significant Diagnostic Tests:  Chest x-ray with no acute infiltrate-reviewed by myself  Micro Data:  Washings left upper lobe 2/5   Antimicrobials:  None  Interim history/subjective:  Intubated, sedated  Objective   Blood pressure (!) 126/91, pulse 90, temperature 98.8 F (37.1 C), temperature source Oral, resp. rate (!) 23, height 5\' 11"  (1.803 m), weight 108.9 kg, SpO2 100 %.    Vent Mode: PRVC FiO2  (%):  [60 %-100 %] 60 % Set Rate:  [16 bmp] 16 bmp Vt Set:  [600 mL] 600 mL PEEP:  [5 cmH20] 5 cmH20   Intake/Output Summary (Last 24 hours) at 11/17/2019 1701 Last data filed at 11/17/2019 1540 Gross per 24 hour  Intake 800 ml  Output 200 ml  Net 600 ml   Filed Weights   11/17/19 0845 11/17/19 1418  Weight: 108.9 kg 108.9 kg    Examination: General: Middle-age gentleman, does not appear to be in distress, HENT: Moist oral mucosa, endotracheal tube in place Lungs: Clear breath sounds bilaterally Cardiovascular: S1-S2 appreciated Abdomen: Soft, bowel sounds appreciated Extremities: No clubbing, no edema Neuro: Sedated GU:   Resolved Hospital Problem list     Assessment & Plan:  Hemoptysis -S/p bronchoscopy revealing a clot in the left upper lobe -Rest of his airway did appear clean with no evidence of fresh bleeding -The clot in the left upper lobe could not be easily aspirated -Airway did appear patent  Will obtain a CT scan of the chest with contrast  Tranexamic acid nebulization 500 mg 3 times daily  Will keep the patient sedated -Continue propofol  Acute respiratory failure -Continue ventilator support  Start tube feeds Follow labs    Best practice:  Diet: Tube feed Pain/Anxiety/Delirium protocol (if indicated): Propofol VAP protocol (if indicated): In place DVT prophylaxis: SCD, heparin GI prophylaxis: None Glucose control: Monitor Mobility: Bedrest Code Status: Full code Family Communication: Discussed with spouse Disposition: ICU  Labs  CBC: Recent Labs  Lab 11/17/19 0849  WBC 10.6*  HGB 14.9  HCT 43.5  MCV 93.1  PLT [id number]    Basic Metabolic Panel: Recent Labs  Lab 11/17/19 0849  NA 132*  K 3.8  CL 100  CO2 22  GLUCOSE 112*  BUN 15  CREATININE 1.04  CALCIUM 8.8*   GFR: Estimated Creatinine Clearance: 100.7 mL/min (by C-G formula based on SCr of 1.04 mg/dL). Recent Labs  Lab 11/17/19 0849  WBC 10.6*    Liver Function  Tests: Recent Labs  Lab 11/17/19 0849  AST 23  ALT 28  ALKPHOS 93  BILITOT 0.6  PROT 7.9  ALBUMIN 4.4   No results for input(s): LIPASE, AMYLASE in the last 168 hours. No results for input(s): AMMONIA in the last 168 hours.  ABG No results found for: PHART, PCO2ART, PO2ART, HCO3, TCO2, ACIDBASEDEF, O2SAT   Coagulation Profile: No results for input(s): INR, PROTIME in the last 168 hours.  Cardiac Enzymes: No results for input(s): CKTOTAL, CKMB, CKMBINDEX, TROPONINI in the last 168 hours.  HbA1C: No results found for: HGBA1C  CBG: No results for input(s): GLUCAP in the last 168 hours.  Review of Systems:   Unobtainable  Past Medical History  He,  has a past medical history of Coronary artery disease (per pt last cardiology visit 2011 (found documentation 12-05-2009 w/ dr Leonia Reeves with [facility] in epic)/  currently pt is followed by pcp), GERD (gastroesophageal reflux disease), Hyperlipidemia, Olecranon bursitis of left elbow, S/P right coronary artery (RCA) stent placement (12/28/2008), and Wears dentures.   Surgical History    Past Surgical History:  Procedure Laterality Date  . CARDIAC CATHETERIZATION  12-09-2009  dr Ilda Foil   abnormal stress test (12-05-2009) widely patent coronaries present w/ evidence of diffuse atherosclerotic disease/  widely patent stented segments in the proximal and mid segment RCA/  LVEF 65%  . CORONARY ANGIOPLASTY WITH STENT PLACEMENT  12-28-2008  dr berry   PCI and stenting of the proxmial and mid RCA using cutting balloon atherectomy (x2 DES, Xience)/  LVEF >60% w/ normal wall motion  . KNEE ARTHROSCOPY W/ MENISCECTOMY Right 2002  . NEUROPLASTY / TRANSPOSITION ULNAR NERVE AT ELBOW Right 10/2014   dr Caralyn Guile  . OLECRANON BURSECTOMY Left 05/06/2017   Procedure: LEFT ELBOW OLECRANON BURSA EXCISION;  Surgeon: Iran Planas, MD;  Location: [facility];  Service: Orthopedics;  Laterality: Left;  . ORIF RIGHT ANKLE FX  2006  .  TONSILLECTOMY  age 29  . TRANSTHORACIC ECHOCARDIOGRAM  03/30/2016   mild concentric LVH, ef 50-55%/  trivial TR     Social History   reports that he has been smoking cigarettes. He has a 78.00 pack-year smoking history. He has never used smokeless tobacco. He reports current alcohol use. He reports that he does not use drugs.   Family History   His family history includes Cancer in his father and mother.   Allergies Allergies  Allergen Reactions  . Codeine Nausea And Vomiting  . Onion Swelling    "thoart swells up"     Home Medications  Prior to Admission medications   Medication Sig Start Date End Date Taking? Authorizing Provider  amoxicillin (AMOXIL) 500 MG capsule Take 1 capsule (500 mg total) by mouth 3 (three) times daily. 11/14/19  Yes Blanchie Dessert, MD  aspirin EC 325 MG tablet Take 325 mg by mouth every other day.   Yes [provider]  Cyanocobalamin (VITAMIN B-12) 5000 MCG TBDP Take 5,000 Units  by mouth daily as needed.   Yes [provider]  oxymetazoline (AFRIN) 0.05 % nasal spray Place 1 spray into both nostrils daily.   Yes [provider]    The patient is critically ill with multiple organ systems failure and requires high complexity decision making for assessment and support, frequent evaluation and titration of therapies, application of advanced monitoring technologies and extensive interpretation of multiple databases. Critical Care Time devoted to patient care services described in this note independent of APP/resident time (if applicable)  is 30 minutes.   Sherrilyn Rist MD [facility] Personal pager: [phone] If unanswered, please page CCM On-call: [phone]

## 2019-11-17 NOTE — Anesthesia Postprocedure Evaluation (Signed)
Anesthesia Post Note  Patient: Matthew [name]  Procedure(s) Performed: ESOPHAGOGASTRODUODENOSCOPY (EGD) (N/A )     Patient location during evaluation: Endoscopy Anesthesia Type: General and MAC Level of consciousness: patient remains intubated per anesthesia plan Vital Signs Assessment: post-procedure vital signs reviewed and stable Respiratory status: patient remains intubated per anesthesia plan and respiratory function stable Cardiovascular status: stable Anesthetic complications: no Comments: Intubated urgently after EGD identified no GI source of bleeding- likely respiratory. Coughing up copious frank blood; decision to intubate and send to ICU for bronch.    Last Vitals:  Vitals:   11/17/19 1222 11/17/19 1418  BP:  (!) 157/94  Pulse: 65   Resp:  (!) 30  Temp:    SpO2: 93% 98%    Last Pain:  Vitals:   11/17/19 1418  TempSrc: Oral  PainSc:                  Pervis Hocking

## 2019-11-17 NOTE — Progress Notes (Signed)
[facility] Progress Note Patient Name: Matthew [name] DOB: 03-23-1966 MRN: [id number]   Date of Service  11/17/2019  HPI/Events of Note  Notified that patient became hypotensive and bradycardic with Precedex and had to turn off with patient waking up  eICU Interventions   Patient now sedated adequately on just Fentanyl with Map 65  Restraints ordered and bedside RN to update me if BP remains low     Intervention Category Minor Interventions: Agitation / anxiety - evaluation and management  Judd Lien 11/17/2019, 11:39 PM

## 2019-11-17 NOTE — Anesthesia Preprocedure Evaluation (Addendum)
Anesthesia Evaluation  Patient identified by MRN, date of birth, ID band Patient awake    Reviewed: Allergy & Precautions, NPO status , Patient's Chart, lab work & pertinent test results  Airway Mallampati: II  TM Distance: >3 FB Neck ROM: Full    Dental  (+) Upper Dentures, Dental Advisory Given   Pulmonary COPD, Current Smoker,  36 pack year history   Pulmonary exam normal breath sounds clear to auscultation       Cardiovascular + CAD and + Cardiac Stents  Normal cardiovascular exam Rhythm:Regular Rate:Normal  S/p PCI and DES x 2 to RCA 2010  Last echo 2017: - Left ventricle: The cavity size was normal. There was mildconcentric hypertrophy. Systolic function was normal. Theestimated ejection fraction was in the range of 50% to 55%. Wallmotion was normal; there were no regional wall motionabnormalities. Left ventricular diastolic function parameterswere normal.  - Aortic valve: Transvalvular velocity was within the normal range. There was no stenosis. There was no regurgitation.  - Mitral valve: Transvalvular velocity was within the normal range. There was no evidence for stenosis. There was no regurgitation.  - Right ventricle: The cavity size was normal. Wall thickness was normal. Systolic function was normal.  - Atrial septum: No defect or patent foramen ovale was identified by color flow Doppler.  - Tricuspid valve: There was trivial regurgitation.  - Pulmonary arteries: Systolic pressure was within the normal range. PA peak pressure: 18 mm Hg (S).    Neuro/Psych negative neurological ROS  negative psych ROS   GI/Hepatic Neg liver ROS, GERD  Medicated,FOBT +, melena   Endo/Other  negative endocrine ROS  Renal/GU negative Renal ROS  negative genitourinary   Musculoskeletal negative musculoskeletal ROS (+)   Abdominal   Peds  Hematology negative hematology ROS (+) hct 43   Anesthesia Other Findings    Reproductive/Obstetrics negative OB ROS                                                            Anesthesia Evaluation  Patient identified by MRN, date of birth, ID band Patient awake    Reviewed: Allergy & Precautions, NPO status , Patient's Chart, lab work & pertinent test results  Airway Mallampati: II  TM Distance: >3 FB Neck ROM: Full    Dental no notable dental hx. (+) Edentulous Upper, Edentulous Lower   Pulmonary Current Smoker,    Pulmonary exam normal breath sounds clear to auscultation       Cardiovascular negative cardio ROS Normal cardiovascular exam Rhythm:Regular Rate:Normal     Neuro/Psych Anxiety negative neurological ROS  negative psych ROS   GI/Hepatic Neg liver ROS, GERD  Medicated,  Endo/Other  negative endocrine ROS  Renal/GU negative Renal ROS  negative genitourinary   Musculoskeletal negative musculoskeletal ROS (+)   Abdominal   Peds negative pediatric ROS (+)  Hematology negative hematology ROS (+)   Anesthesia Other Findings   Reproductive/Obstetrics negative OB ROS                           Anesthesia Physical Anesthesia Plan  ASA: II  Anesthesia Plan: General   Post-op Pain Management:    Induction: Intravenous  PONV Risk Score and Plan:   Airway Management Planned: LMA and Oral ETT  Additional Equipment:   Intra-op Plan:   Post-operative Plan: Extubation in OR  Informed Consent: I have reviewed the patients History and Physical, chart, labs and discussed the procedure including the risks, benefits and alternatives for the proposed anesthesia with the patient or authorized representative who has indicated his/her understanding and acceptance.   Dental advisory given  Plan Discussed with: CRNA and Surgeon  Anesthesia Plan Comments:         Anesthesia Quick Evaluation  Anesthesia Physical Anesthesia Plan  ASA: III  Anesthesia Plan: MAC    Post-op Pain Management:    Induction:   PONV Risk Score and Plan: 2 and Propofol infusion and TIVA  Airway Management Planned: Natural Airway and Simple Face Mask  Additional Equipment: None  Intra-op Plan:   Post-operative Plan:   Informed Consent: I have reviewed the patients History and Physical, chart, labs and discussed the procedure including the risks, benefits and alternatives for the proposed anesthesia with the patient or authorized representative who has indicated his/her understanding and acceptance.       Plan Discussed with: CRNA  Anesthesia Plan Comments:        Anesthesia Quick Evaluation

## 2019-11-17 NOTE — Progress Notes (Signed)
Anesthesiologist called to endoscopy suite by team- EGD completed and no source of bleeding identified. Upon exit of scope, profuse bleeding coming through vocal cords. Upon arrival to suite, patient waking up from sedation but coughing and spitting up frank blood, 257mL suctioned from oropharynx. Dr. Benson Norway to ED to speak to physician about involving pulmonology. Collective decision between myself and Dr. Benson Norway to intubate with ETT large enough for bronchoscopy and send to ICU; endo team verified availability of ICU bed. I personally explained the risks and benefits of intubation to the patient and answered all questions. Clarified that the patient agrees to receiving blood products as well as his full code status.

## 2019-11-17 NOTE — ED Provider Notes (Signed)
[name] DEPT Provider Note   CSN: [id number] Arrival date & time: 11/17/19  [id number]     History Chief Complaint  Patient presents with  . Hematemesis    Matthew [name] is a 54 y.o. male.  54 year old male who presents with hematemesis.  Patient seen by his ENT doctor yesterday due to have a history of nosebleeds.  Per the patient, his ENT doctor did not see any evidence of nosebleed at that time.  Patient states that currently he did have emesis of approximately a cup and a half of bright red blood to the point that it went to his nose.  He denies any abdominal pain or fever.  Patient states that this does not feel like his usual nosebleeds which she has had cauterized multiple times.   He has had some dark stools.  Denies any prior history of GI bleeding upper or lower.  He does take an aspirin a day.  He also smokes 2 packs of cigarettes a day.        Past Medical History:  Diagnosis Date  . Coronary artery disease per pt last cardiology visit 2011 (found documentation 12-05-2009 w/ dr Leonia Reeves with [facility] in epic)/  currently pt is followed by pcp   12-28-2008  PCI and DES x2 to proximal and mid RCA/ last cardiac cath 12-09-2009  widely patent stents  . GERD (gastroesophageal reflux disease)   . Hyperlipidemia   . Olecranon bursitis of left elbow   . S/P right coronary artery (RCA) stent placement 12/28/2008   PCI and DES x2-- proximal and mid RCA  . Wears dentures    UPPER    There are no problems to display for this patient.   Past Surgical History:  Procedure Laterality Date  . CARDIAC CATHETERIZATION  12-09-2009  dr Ilda Foil   abnormal stress test (12-05-2009) widely patent coronaries present w/ evidence of diffuse atherosclerotic disease/  widely patent stented segments in the proximal and mid segment RCA/  LVEF 65%  . CORONARY ANGIOPLASTY WITH STENT PLACEMENT  12-28-2008  dr berry   PCI and stenting of the proxmial and mid RCA using  cutting balloon atherectomy (x2 DES, Xience)/  LVEF >60% w/ normal wall motion  . KNEE ARTHROSCOPY W/ MENISCECTOMY Right 2002  . NEUROPLASTY / TRANSPOSITION ULNAR NERVE AT ELBOW Right 10/2014   dr Caralyn Guile  . OLECRANON BURSECTOMY Left 05/06/2017   Procedure: LEFT ELBOW OLECRANON BURSA EXCISION;  Surgeon: Iran Planas, MD;  Location: [facility];  Service: Orthopedics;  Laterality: Left;  . ORIF RIGHT ANKLE FX  2006  . TONSILLECTOMY  age 43  . TRANSTHORACIC ECHOCARDIOGRAM  03/30/2016   mild concentric LVH, ef 50-55%/  trivial TR       Family History  Problem Relation Age of Onset  . Cancer Mother   . Cancer Father     Social History   Tobacco Use  . Smoking status: Current Every Day Smoker    Packs/day: 2.00    Years: 39.00    Pack years: 78.00    Types: Cigarettes  . Smokeless tobacco: Never Used  . Tobacco comment: since age 89  Substance Use Topics  . Alcohol use: Yes    Comment: seldom  . Drug use: No    Home Medications Prior to Admission medications   Medication Sig Start Date End Date Taking? Authorizing Provider  amoxicillin (AMOXIL) 500 MG capsule Take 1 capsule (500 mg total) by mouth 3 (three) times daily. 11/14/19  Blanchie Dessert, MD  aspirin EC 325 MG tablet Take 325 mg by mouth every other day.    [provider]  ranitidine (ZANTAC) 150 MG tablet Take 150 mg by mouth as needed for heartburn.    [provider]    Allergies    Codeine and Onion  Review of Systems   Review of Systems  All other systems reviewed and are negative.   Physical Exam Updated Vital Signs BP (!) 149/97 (BP Location: Left Arm)   Pulse 84   Temp 98.8 F (37.1 C) (Oral)   Resp 17   Ht 1.803 m (5\' 11" )   Wt 108.9 kg   SpO2 98%   BMI 33.47 kg/m   Physical Exam Vitals and nursing note reviewed.  Constitutional:      General: He is not in acute distress.    Appearance: Normal appearance. He is well-developed. He is not toxic-appearing.   HENT:     Head: Normocephalic and atraumatic.     Nose:     Comments: Dried blood noted in both nares.  No active bleeding noted Eyes:     General: Lids are normal.     Conjunctiva/sclera: Conjunctivae normal.     Pupils: Pupils are equal, round, and reactive to light.  Neck:     Thyroid: No thyroid mass.     Trachea: No tracheal deviation.  Cardiovascular:     Rate and Rhythm: Normal rate and regular rhythm.     Heart sounds: Normal heart sounds. No murmur. No gallop.   Pulmonary:     Effort: Pulmonary effort is normal. No respiratory distress.     Breath sounds: Normal breath sounds. No stridor. No decreased breath sounds, wheezing, rhonchi or rales.  Abdominal:     General: Bowel sounds are normal. There is no distension.     Palpations: Abdomen is soft.     Tenderness: There is no abdominal tenderness. There is no rebound.  Musculoskeletal:        General: No tenderness. Normal range of motion.     Cervical back: Normal range of motion and neck supple.  Skin:    General: Skin is warm and dry.     Findings: No abrasion or rash.  Neurological:     Mental Status: He is alert and oriented to person, place, and time.     GCS: GCS eye subscore is 4. GCS verbal subscore is 5. GCS motor subscore is 6.     Cranial Nerves: No cranial nerve deficit.     Sensory: No sensory deficit.  Psychiatric:        Speech: Speech normal.        Behavior: Behavior normal.     ED Results / Procedures / Treatments   Labs (all labs ordered are listed, but only abnormal results are displayed) Labs Reviewed  COMPREHENSIVE METABOLIC PANEL  CBC  POC OCCULT BLOOD, ED  POC OCCULT BLOOD, ED  TYPE AND SCREEN    EKG None  Radiology DG Chest 2 View  Result Date: 11/16/2019 CLINICAL DATA:  Hemoptysis. EXAM: CHEST - 2 VIEW COMPARISON:  December 27, 2008 FINDINGS: The heart size and mediastinal contours are within normal limits. Both lungs are clear. The visualized skeletal structures are  unremarkable. IMPRESSION: No active cardiopulmonary disease. Electronically Signed   By: Virgina Norfolk M.D.   On: 11/16/2019 22:32    Procedures Procedures (including critical care time)  Medications Ordered in ED Medications - No data to display  ED  Course  I have reviewed the triage vital signs and the nursing notes.  Pertinent labs & imaging results that were available during my care of the patient were reviewed by me and considered in my medical decision making (see chart for details).    MDM Rules/Calculators/A&P                      Consult made to gi Seen and pt to have egd  Final Clinical Impression(s) / ED Diagnoses Final diagnoses:  None    Rx / DC Orders ED Discharge Orders    None       Lacretia Leigh, MD 11/21/19 [phone]

## 2019-11-17 NOTE — Progress Notes (Signed)
Paged [facility]-Link nurse to obtain an order for Vasopressors.  Pt's blood pressures are low and MAP has dropped to 64.  Also requested patient be switched from Propofol to Precedex because of elevated triglycerides.

## 2019-11-17 NOTE — Consult Note (Signed)
Referring Provider: Dr. Zenia Resides  Primary Care Physician:  Merrilee Seashore, MD Primary Gastroenterologist:  Althia Forts   Reason for Consultation:  Hematemesis   HPI: Matthew [name] is a 54 y.o. male with a past medical history of hyper lipidemia, coronary artery disease s/p DES x 2 in  2010 and GERD.  On Monday 2/01 tasted blood in his mouth which resulted in spitting out bright red blood at 7:30 PM which lasted for approximately 25 minutes.  At 11:15pm he spit up more bright red blood which lasted for 20 minutes.  No associated trauma.  He presented to [facility] ED early in the morning 2/2, however, after a prolonged wait he left and went home.  He returned back to [facility] ED later that morning accompanied by his wife.  There was  low suspicion for a pulmonary hemorrhage.  He was prescribed Afrin, Ocean nasal spray and amoxicillin for possible sinus infection.  He was referred to ENT Dr. Benjamine Mola.  Wednesday 2/3 he went to work and spit out to quarter size darker red blood clots.  Noted having bleeding from his nose as well.  He stated the blood was from his throat and was not deeply produced from his lungs.  He denied vomiting up the blood.  He had another episode of coughing then spit out bright red blood at 2:30 PM.  He was seen by Dr. Benjamine Mola on 2/04/202, a laryngoscopy extended from his sinuses to his larynx did not show any source of his bleeding.  Today at 5:25 AM he had recurrence of spitting up blood without noticeable blood clots.  He presented to [facility] ED for further evaluation.  Currently, he denies having any nasal bleeding or spitting up blood since he has been in the ED.  He denies having any recent nasal or facial trauma.  He reported having excessive nosebleeds as a child which required cauterization x48.  His nosebleeds abated by the age of 48 and he had one other nosebleed at the age of 47.  He has heartburn 3 to 4 days monthly.  Dysphagia.  He is having  upper abdominal soreness which started after he began coughing up blood.  He previously took Zantac '150mg'$  one 1 tab as needed.  He switched to Pepcid as needed 60 days ago.  He denies ever having an EGD.  History of CAD status post 2 DES in 2010 he takes aspirin 325 mg 1 p.o. q. other day, last dose was taken on Saturday 11/11/2019.  He is passing a normal formed brown bowel movement most days out noticeable rectal bleeding.  However, he passed a dark black soft stool earlier this morning.  He underwent a colonoscopy approximately 5 years ago by a GI in [place] which he reported was normal.  He denies alcohol use. He smokes 2 packs of cigarettes daily for the past 39 years.  No family history of esophageal, gastric or colorectal cancer.  ED course: Sodium 132.  Potassium 3.8.  Glucose 112.  BUN 15.  Creatinine 1.04.  Calcium 8.8.  Anion gap 10.  Alk phos 93.  Albumin 4.4.  AST 23.  ALT 28.  Total bili 0.6.  WBC 10.6.  Hemoglobin 14.9.  Hematocrit 43.5.  Platelet 210.  FOBT positive.  Baseline hemoglobin 15.8 on 05/06/2017  Chest x-ray 2/4/202: No active cardiopulmonary disease.     Past Medical History:  Diagnosis Date   Coronary artery disease per pt last cardiology visit 2011 (found documentation  12-05-2009 w/ dr Leonia Reeves with [facility] in epic)/  currently pt is followed by pcp   12-28-2008  PCI and DES x2 to proximal and mid RCA/ last cardiac cath 12-09-2009  widely patent stents   GERD (gastroesophageal reflux disease)    Hyperlipidemia    Olecranon bursitis of left elbow    S/P right coronary artery (RCA) stent placement 12/28/2008   PCI and DES x2-- proximal and mid RCA   Wears dentures    UPPER    Past Surgical History:  Procedure Laterality Date   CARDIAC CATHETERIZATION  12-09-2009  dr Ilda Foil   abnormal stress test (12-05-2009) widely patent coronaries present w/ evidence of diffuse atherosclerotic disease/  widely patent stented segments in the proximal and mid segment  RCA/  LVEF 65%   CORONARY ANGIOPLASTY WITH STENT PLACEMENT  12-28-2008  dr berry   PCI and stenting of the proxmial and mid RCA using cutting balloon atherectomy (x2 DES, Xience)/  LVEF >60% w/ normal wall motion   KNEE ARTHROSCOPY W/ MENISCECTOMY Right 2002   NEUROPLASTY / TRANSPOSITION ULNAR NERVE AT ELBOW Right 10/2014   dr Bobette Mo BURSECTOMY Left 05/06/2017   Procedure: LEFT ELBOW OLECRANON BURSA EXCISION;  Surgeon: Iran Planas, MD;  Location: [facility];  Service: Orthopedics;  Laterality: Left;   ORIF RIGHT ANKLE FX  2006   TONSILLECTOMY  age 12   TRANSTHORACIC ECHOCARDIOGRAM  03/30/2016   mild concentric LVH, ef 50-55%/  trivial TR    Prior to Admission medications   Medication Sig Start Date End Date Taking? Authorizing Provider  amoxicillin (AMOXIL) 500 MG capsule Take 1 capsule (500 mg total) by mouth 3 (three) times daily. 11/14/19   Blanchie Dessert, MD  aspirin EC 325 MG tablet Take 325 mg by mouth every other day.    [provider]  ranitidine (ZANTAC) 150 MG tablet Take 150 mg by mouth as needed for heartburn.    [provider]    No current facility-administered medications for this encounter.   Current Outpatient Medications  Medication Sig Dispense Refill   amoxicillin (AMOXIL) 500 MG capsule Take 1 capsule (500 mg total) by mouth 3 (three) times daily. 21 capsule 0   aspirin EC 325 MG tablet Take 325 mg by mouth every other day.     ranitidine (ZANTAC) 150 MG tablet Take 150 mg by mouth as needed for heartburn.      Allergies as of 11/17/2019 - Review Complete 11/17/2019  Allergen Reaction Noted   Codeine Nausea And Vomiting    Onion Swelling 04/30/2017    Family History  Problem Relation Age of Onset   Cancer Mother    Cancer Father     Social History   Socioeconomic History   Marital status: Married    Spouse name: Not on file   Number of children: Not on file   Years of education: Not  on file   Highest education level: Not on file  Occupational History   Not on file  Tobacco Use   Smoking status: Current Every Day Smoker    Packs/day: 2.00    Years: 39.00    Pack years: 78.00    Types: Cigarettes   Smokeless tobacco: Never Used   Tobacco comment: since age 3  Substance and Sexual Activity   Alcohol use: Yes    Comment: seldom   Drug use: No   Sexual activity: Not on file  Other Topics Concern   Not on file  Social History Narrative   Not on file   Social Determinants of Health   Financial Resource Strain:    Difficulty of Paying Living Expenses: Not on file  Food Insecurity:    Worried About Charity fundraiser in the Last Year: Not on file   [employer] of Food in the Last Year: Not on file  Transportation Needs:    Lack of Transportation (Medical): Not on file   Lack of Transportation (Non-Medical): Not on file  Physical Activity:    Days of Exercise per Week: Not on file   Minutes of Exercise per Session: Not on file  Stress:    Feeling of Stress : Not on file  Social Connections:    Frequency of Communication with Friends and Family: Not on file   Frequency of Social Gatherings with Friends and Family: Not on file   Attends Religious Services: Not on file   Active Member of Clubs or Organizations: Not on file   Attends Archivist Meetings: Not on file   Marital Status: Not on file  Intimate Partner Violence:    Fear of Current or Ex-Partner: Not on file   Emotionally Abused: Not on file   Physically Abused: Not on file   Sexually Abused: Not on file    Review of Systems: Gen: Denies fever, sweats or chills. No weight loss.  CV: Denies chest pain, palpitations or edema. Resp: Denies cough, shortness of breath of hemoptysis.  GI: See HPI. GU : Denies urinary burning, blood in urine, increased urinary frequency or incontinence. MS: Denies joint pain, muscles aches or weakness. Derm: Denies rash,  itchiness, skin lesions or unhealing ulcers. Psych: Denies depression, anxiety, memory loss, suicidal ideation or confusion. Heme: Denies easy bruising, bleeding. Neuro:  Denies headaches, dizziness or paresthesias. Endo:  Denies any problems with DM, thyroid or adrenal function.  Physical Exam: Vital signs in last 24 hours: Temp:  [98.8 F (37.1 C)] 98.8 F (37.1 C) (02/05 0844) Pulse Rate:  [81-84] 81 (02/05 0907) Resp:  [16-17] 16 (02/05 0907) BP: (137-149)/(90-97) 137/90 (02/05 0907) SpO2:  [97 %-98 %] 97 % (02/05 0907) Weight:  [108.9 kg] 108.9 kg (02/05 0845)   General:  Alert,  well-developed, well-nourished, pleasant and cooperative in NAD. Head:  Normocephalic and atraumatic. Eyes:  No scleral icterus. Conjunctiva pink. Ears:  Normal auditory acuity. Nose: Dried blood to the external nares . Mouth: No deformity or lesions.  Tongue with yellow-brown coating.  Upper dentures.  Posterior pharynx is erythematous. Neck:  Supple. No lymphadenopathy or thyromegaly.  Lungs: Sounds clear throughout. Heart: The rate and rhythm, no murmurs. Abdomen: Soft, nondistended.  Mild epigastric tenderness no rebound or guarding.  Hypoactive bowel sounds all 4 quadrants.  No HSM. Rectal: Dark melenic stool in the rectal vault grossly heme positive.  ER NT present during exam. Msk:  Symmetrical without gross deformities.  Pulses:  Normal pulses noted. Extremities:  Without clubbing or edema. Neurologic:  Alert and  oriented x4. No focal deficits.  Skin:  Intact without significant lesions or rashes. Psych:  Alert and cooperative. Normal mood and affect.  Intake/Output from previous day: No intake/output data recorded. Intake/Output this shift: No intake/output data recorded.  Lab Results: Recent Labs    11/17/19 0849  WBC 10.6*  HGB 14.9  HCT 43.5  PLT 210   BMET Recent Labs    11/17/19 0849  NA 132*  K 3.8  CL 100  CO2 22  GLUCOSE 112*  BUN  15  CREATININE 1.04  CALCIUM  8.8*   LFT Recent Labs    11/17/19 0849  PROT 7.9  ALBUMIN 4.4  AST 23  ALT 28  ALKPHOS 93  BILITOT 0.6   PT/INR No results for input(s): LABPROT, INR in the last 72 hours. Hepatitis Panel No results for input(s): HEPBSAG, HCVAB, HEPAIGM, HEPBIGM in the last 72 hours.   Studies/Results: DG Chest 2 View  Result Date: 11/16/2019 CLINICAL DATA:  Hemoptysis. EXAM: CHEST - 2 VIEW COMPARISON:  December 27, 2008 FINDINGS: The heart size and mediastinal contours are within normal limits. Both lungs are clear. The visualized skeletal structures are unremarkable. IMPRESSION: No active cardiopulmonary disease. Electronically Signed   By: Virgina Norfolk M.D.   On: 11/16/2019 22:32    IMPRESSION/PLAN:  23.  54 year old male with epistaxis versus upper GI bleeding.  Melenic stool on exam.  Hemoglobin 14.9 -Remain n.p.o. for possible EGD later today -EGD benefits and risk discussed including risk with sedation, risk of bleeding, perforation and infection.  Patient refuses COVID-19 nasopharyngeal testing due to his concern for trauma in the setting of past severe nosebleeds.  I explained to the patient service will not be able to complete an EGD without the Covid testing. -IV fluids per ED physician -Pantoprazole 40 mg IV daily, if the patient goes home I would recommend Pantoprazole 40 mg p.o. twice daily until an EGD is completed -Check CBC in a.m. -If patient exhibits further active bleeding then stat H&H  2.  GERD -Plan #1  3.  History of CAD with 2 DES on ASA 325 mg q. OD  4.  Colon cancer screening. He reported having a colonoscopy years ago. -follow-up as outpatient to determine his next colonoscopy is due   Noralyn Pick  11/17/2019, 10:23 AM

## 2019-11-17 NOTE — ED Triage Notes (Addendum)
Per patient, he started vomiting blood on Monday 11/13/19, patient states it feels like he has acid reflux. Patient states Dr Benjamine Mola, ENT told patient to come to ED for GI doctor stat. Patient denies injury, denies taking anticoagulants. Patient states he was evaluated here 3 days ago, MD told him it was nosebleeds and gave him antibiotics and nasal spray and told to follow up with Dr Benjamine Mola. Per patient, Dr Benjamine Mola states it is not nose bleeds.

## 2019-11-18 LAB — GLUCOSE, CAPILLARY
Glucose-Capillary: 132 mg/dL — ABNORMAL HIGH (ref 70–99)
Glucose-Capillary: 145 mg/dL — ABNORMAL HIGH (ref 70–99)
Glucose-Capillary: 102 mg/dL — ABNORMAL HIGH (ref 70–99)
Glucose-Capillary: 81 mg/dL (ref 70–99)
Glucose-Capillary: 95 mg/dL (ref 70–99)

## 2019-11-18 LAB — CBC
HCT: 42.3 % (ref 39.0–52.0)
Hemoglobin: 13.8 g/dL (ref 13.0–17.0)
MCH: 31.2 pg (ref 26.0–34.0)
MCHC: 32.6 g/dL (ref 30.0–36.0)
MCV: 95.5 fL (ref 80.0–100.0)
Platelets: 211 10*3/uL (ref 150–400)
RBC: 4.43 MIL/uL (ref 4.22–5.81)
RDW: 12.9 % (ref 11.5–15.5)
WBC: 23.8 10*3/uL — ABNORMAL HIGH (ref 4.0–10.5)
nRBC: 0 % (ref 0.0–0.2)

## 2019-11-18 LAB — BASIC METABOLIC PANEL
Anion gap: 10 (ref 5–15)
BUN: 14 mg/dL (ref 6–20)
CO2: 22 mmol/L (ref 22–32)
Calcium: 8.4 mg/dL — ABNORMAL LOW (ref 8.9–10.3)
Chloride: 103 mmol/L (ref 98–111)
Creatinine, Ser: 1.21 mg/dL (ref 0.61–1.24)
GFR calc Af Amer: >60 mL/min (ref >60)
GFR calc non Af Amer: >60 mL/min (ref >60)
Glucose, Bld: 133 mg/dL — ABNORMAL HIGH (ref 70–99)
Potassium: 4.2 mmol/L (ref 3.5–5.1)
Sodium: 135 mmol/L (ref 135–145)

## 2019-11-18 LAB — PROTIME-INR
INR: 1 (ref 0.8–1.2)
Prothrombin Time: 13.1 seconds (ref 11.4–15.2)

## 2019-11-18 LAB — PHOSPHORUS
Phosphorus: 3.6 mg/dL (ref 2.5–4.6)
Phosphorus: 3.6 mg/dL (ref 2.5–4.6)

## 2019-11-18 LAB — MAGNESIUM
Magnesium: 2.4 mg/dL (ref 1.7–2.4)
Magnesium: 2.3 mg/dL (ref 1.7–2.4)

## 2019-11-18 MED ORDER — NOREPINEPHRINE 4 MG/250ML-% IV SOLN
0.0000 ug/min | INTRAVENOUS | Status: DC
  Administered 2019-11-18: 03:00:00 40 ug/min via INTRAVENOUS
  Filled 2019-11-18: qty 250

## 2019-11-18 MED ORDER — RACEPINEPHRINE HCL 2.25 % IN NEBU: 0.5000 mL | INHALATION_SOLUTION | RESPIRATORY_TRACT | Status: DC | PRN

## 2019-11-18 MED ORDER — MIDAZOLAM 50MG/50ML (1MG/ML) PREMIX INFUSION
0.5000 mg/h | INTRAVENOUS | Status: DC
  Administered 2019-11-18: 06:00:00 0.5 mg/h via INTRAVENOUS
  Filled 2019-11-18: qty 50

## 2019-11-18 MED ORDER — LACTATED RINGERS IV BOLUS
500.0000 mL | Freq: Once | INTRAVENOUS | Status: AC
  Administered 2019-11-18: 16:00:00 500 mL via INTRAVENOUS

## 2019-11-18 MED ORDER — MIDAZOLAM 50MG/50ML (1MG/ML) PREMIX INFUSION
6.0000 mg/h | INTRAVENOUS | Status: DC
  Administered 2019-11-18 – 2019-11-19 (×3): 4 mg/h via INTRAVENOUS
  Filled 2019-11-18 (×2): qty 50

## 2019-11-18 MED ORDER — DEXTROSE IN LACTATED RINGERS 5 % IV SOLN: INTRAVENOUS | Status: DC

## 2019-11-18 NOTE — Progress Notes (Signed)
[facility] Progress Note Patient Name: Matthew [name] DOB: [birth year] MRN: [id number]   Date of Service  11/18/2019  HPI/Events of Note  Notified of coffee ground emesis. On assessment patient had blood in ET tube. He was awake and coughing. Unable to increase Fentanyl due to hypotension. Chest CT reviewed and bleeding likely coming from left lung.  eICU Interventions   Ordered racemic epinephrine  Ordered to start norepinephrine so as to be able to facilitate sedation and suppress cough  Instructed bedside team to have him on left lateral decubitus position  Ordered PT INR and correct if needed. Patient not on anticoagulation nor antiplatelets        Shona Needles Renlee Floor 11/18/2019, 2:37 AM

## 2019-11-18 NOTE — Progress Notes (Signed)
Patient has had several episodes of coughing up blood throughout the shift.  Patient is difficult to sedate.  Currently patient is receiving fentanyl 350 mcg and still alert.  Patient continue to cough up blood.

## 2019-11-18 NOTE — Progress Notes (Signed)
[facility] Progress Note Patient Name: Matthew [name] DOB: [birth year] MRN: [id number]   Date of Service  11/18/2019  HPI/Events of Note  Notified of urinary retention >400 cc on bladder scan  eICU Interventions  Ordered in and out cath     Intervention Category Intermediate Interventions: Oliguria - evaluation and management  Judd Lien 11/18/2019, 6:51 AM

## 2019-11-18 NOTE — Progress Notes (Signed)
NAME:  ZYION SMIALEK, MRN:  [id number], DOB:  1965-12-20, LOS: 1 ADMISSION DATE:  11/17/2019, CONSULTATION DATE:  11/17/2019 REFERRING MD: ED doc, CHIEF COMPLAINT: Hemoptysis  Brief History   Vomiting blood since 2/1 Was advised by Dr. Benjamine Mola of ENT to come to the ED, concern for GI bleed Patient taken to the endoscopy suite where he had EGD performed-EGD was unremarkable but was noted to be coughing up blood during the procedure, as the procedure was being concluded he had a significant amount of hemoptysis-about 200 cc of blood He was intubated and PCCM was consulted  Active smoker-about 78-pack-year smoking history Dad had cancer- lung cancer  Was scoped by Dr. Rutherford Guys evidence of oropharyngeal source of bleeding -History of nosebleeds as a child, some that required cauterization  Past Medical History:  Diagnosis Date  . Coronary artery disease per pt last cardiology visit 2011 (found documentation 12-05-2009 w/ dr Leonia Reeves with [facility] in epic)/  currently pt is followed by pcp   12-28-2008  PCI and DES x2 to proximal and mid RCA/ last cardiac cath 12-09-2009  widely patent stents  . GERD (gastroesophageal reflux disease)   . Hyperlipidemia   . Olecranon bursitis of left elbow   . S/P right coronary artery (RCA) stent placement 12/28/2008   PCI and DES x2-- proximal and mid RCA  . Wears dentures    UPPER    Significant Hospital Events   EGD 2/5-unremarkable Bronchoscopy 2/5-clot in the left upper lobe  Consults:  PCCM GI  Procedures:  Bronchoscopy 11/17/2019 EGD 11/17/2019  Significant Diagnostic Tests:  Chest x-ray with no acute infiltrate-reviewed by myself CT scan of the chest reviewed IMPRESSION: 1. There is dense consolidation of the lingula, and the lingular bronchus appears obstructed near the origin (series 7, image 70). This may be by clot, as reported on bronchoscopy. 2. There is some lucency within the lingular consolidation concerning for a developing  cavitation (series 7, image 78). 3. Small bilateral pleural effusions and dependent bibasilar atelectasis or consolidation. 4. Enlarged pretracheal lymph nodes measuring up to 2.4 x 1.3 cm, nonspecific and perhaps reactive. 5.  Emphysema (ICD10-J43.9). 6. Small, brightly hyperenhancing foci in the left lobe of the liver and right lobe of the liver. These are nonspecific and could represent flash filling hemangiomas or transient perfusion artifacts. Consider multiphasic contrast enhanced MRI to further evaluate on a nonacute basis when clinically appropriate. 7. Coronary artery disease. Micro Data:  Washings left upper lobe 2/5-no organism  Antimicrobials:  None  Interim history/subjective:  Intubated 11/16/18/2021 Had a difficult time of the night with sedation, associated hypotension Still had bright red blood per endotracheal tube, also noted to have bleeding from his mouth Wide awake this morning-choice was made to sedate him and not plan for extubation as he still has bright red blood per tube  Objective   Blood pressure 125/86, pulse 62, temperature (!) 96.1 F (35.6 C), temperature source Axillary, resp. rate 13, height 5\' 11"  (1.803 m), weight 110.4 kg, SpO2 95 %.    Vent Mode: PSV;CPAP FiO2 (%):  [60 %-100 %] 60 % Set Rate:  [16 bmp] 16 bmp Vt Set:  [600 mL] 600 mL PEEP:  [5 cmH20] 5 cmH20 Pressure Support:  [5 cmH20] 5 cmH20 Plateau Pressure:  [13 cmH20-18 cmH20] 13 cmH20   Intake/Output Summary (Last 24 hours) at 11/18/2019 0819 Last data filed at 11/18/2019 0810 Gross per 24 hour  Intake 3516.75 ml  Output 300 ml  Net 3216.75 ml  Filed Weights   11/17/19 0845 11/17/19 1418 11/18/19 0600  Weight: 108.9 kg 108.9 kg 110.4 kg    Examination: General: Middle-age gentleman, does not appear to be in distress  HENT: Moist oral mucosa, endotracheal tube in place Lungs: Clear breath sounds bilaterally Cardiovascular: S1-S2 appreciated Abdomen: Bowel sounds  appreciated Extremities: No clubbing, no edema Neuro: Sedated GU:   Resolved Hospital Problem list     Assessment & Plan:  Hemoptysis -S/p bronchoscopy with a clot in the left upper lobe -Still with some bleeding during the night -Airway did appear patent without any lesions during bronchoscopy -CT scan reveals atelectasis which is consistent with a clot obstructing the left lingula -Mediastinal adenopathy is likely reactive-needs follow-up  Abnormal CT scan of the chest -Mediastinal adenopathy -This will need follow-up  Acute respiratory failure -Continue vent support  For hemoptysis continue tranexamic acid nebulization 500 mg 3 times daily -May take about 48-72 hours for active bleeding to stop  We will keep the patient sedated -Currently on Versed drip -Fentanyl   There is significant risk with extubation at present if he has a massive hemoptysis with potential of losing his airway  Discontinue subcutaneous heparin -Continue with SCDs  Best practice:  Diet: Tube feed Pain/Anxiety/Delirium protocol (if indicated): Propofol VAP protocol (if indicated): In place DVT prophylaxis: SCD, heparin GI prophylaxis: None Glucose control: Monitor Mobility: Bedrest Code Status: Full code Family Communication: Discussed with spouse Disposition: ICU  Labs   CBC: Recent Labs  Lab 11/17/19 0849 11/18/19 0203  WBC 10.6* 23.8*  HGB 14.9 13.8  HCT 43.5 42.3  MCV 93.1 95.5  PLT 210 [id number]    Basic Metabolic Panel: Recent Labs  Lab 11/17/19 0849 11/17/19 1823 11/18/19 0203  NA 132*  --  135  K 3.8  --  4.2  CL 100  --  103  CO2 22  --  22  GLUCOSE 112*  --  133*  BUN 15  --  14  CREATININE 1.04  --  1.21  CALCIUM 8.8*  --  8.4*  MG  --  2.3 2.4  PHOS  --  4.1 3.6   GFR: Estimated Creatinine Clearance: 87.1 mL/min (by C-G formula based on SCr of 1.21 mg/dL). Recent Labs  Lab 11/17/19 0849 11/18/19 0203  WBC 10.6* 23.8*    Liver Function Tests: Recent  Labs  Lab 11/17/19 0849  AST 23  ALT 28  ALKPHOS 93  BILITOT 0.6  PROT 7.9  ALBUMIN 4.4   No results for input(s): LIPASE, AMYLASE in the last 168 hours. No results for input(s): AMMONIA in the last 168 hours.  ABG    Component Value Date/Time   PHART 7.317 (L) 11/17/2019 1618   PCO2ART 50.9 (H) 11/17/2019 1618   PO2ART 109 (H) 11/17/2019 1618   HCO3 25.3 11/17/2019 1618   ACIDBASEDEF 0.8 11/17/2019 1618   O2SAT 97.5 11/17/2019 1618     Coagulation Profile: Recent Labs  Lab 11/18/19 0410  INR 1.0    Cardiac Enzymes: No results for input(s): CKTOTAL, CKMB, CKMBINDEX, TROPONINI in the last 168 hours.  HbA1C: No results found for: HGBA1C  CBG: Recent Labs  Lab 11/17/19 2037 11/17/19 2347 11/18/19 0430 11/18/19 0757  GLUCAP 97 101* 132* 145*    Review of Systems:   Unobtainable  Past Medical History  He,  has a past medical history of Coronary artery disease (per pt last cardiology visit 2011 (found documentation 12-05-2009 w/ dr Leonia Reeves with [facility] in epic)/  currently pt  is followed by pcp), GERD (gastroesophageal reflux disease), Hyperlipidemia, Olecranon bursitis of left elbow, S/P right coronary artery (RCA) stent placement (12/28/2008), and Wears dentures.   Surgical History    Past Surgical History:  Procedure Laterality Date  . CARDIAC CATHETERIZATION  12-09-2009  dr Ilda Foil   abnormal stress test (12-05-2009) widely patent coronaries present w/ evidence of diffuse atherosclerotic disease/  widely patent stented segments in the proximal and mid segment RCA/  LVEF 65%  . CORONARY ANGIOPLASTY WITH STENT PLACEMENT  12-28-2008  dr berry   PCI and stenting of the proxmial and mid RCA using cutting balloon atherectomy (x2 DES, Xience)/  LVEF >60% w/ normal wall motion  . KNEE ARTHROSCOPY W/ MENISCECTOMY Right 2002  . NEUROPLASTY / TRANSPOSITION ULNAR NERVE AT ELBOW Right 10/2014   dr Caralyn Guile  . OLECRANON BURSECTOMY Left 05/06/2017   Procedure: LEFT ELBOW  OLECRANON BURSA EXCISION;  Surgeon: Iran Planas, MD;  Location: [facility];  Service: Orthopedics;  Laterality: Left;  . ORIF RIGHT ANKLE FX  2006  . TONSILLECTOMY  age 26  . TRANSTHORACIC ECHOCARDIOGRAM  03/30/2016   mild concentric LVH, ef 50-55%/  trivial TR     Social History   reports that he has been smoking cigarettes. He has a 78.00 pack-year smoking history. He has never used smokeless tobacco. He reports current alcohol use. He reports that he does not use drugs.   Family History   His family history includes Cancer in his father and mother.   Allergies Allergies  Allergen Reactions  . Codeine Nausea And Vomiting  . Onion Swelling    "thoart swells up"     Home Medications  Prior to Admission medications   Medication Sig Start Date End Date Taking? Authorizing Provider  amoxicillin (AMOXIL) 500 MG capsule Take 1 capsule (500 mg total) by mouth 3 (three) times daily. 11/14/19  Yes Blanchie Dessert, MD  aspirin EC 325 MG tablet Take 325 mg by mouth every other day.   Yes [provider]  Cyanocobalamin (VITAMIN B-12) 5000 MCG TBDP Take 5,000 Units by mouth daily as needed.   Yes [provider]  oxymetazoline (AFRIN) 0.05 % nasal spray Place 1 spray into both nostrils daily.   Yes [provider]    The patient is critically ill with multiple organ systems failure and requires high complexity decision making for assessment and support, frequent evaluation and titration of therapies, application of advanced monitoring technologies and extensive interpretation of multiple databases. Critical Care Time devoted to patient care services described in this note independent of APP/resident time (if applicable)  is 32 minutes.   Sherrilyn Rist MD [facility] Personal pager: [phone] If unanswered, please page CCM On-call: [phone]

## 2019-11-18 NOTE — Plan of Care (Signed)
  Problem: Education: Goal: Knowledge of General Education information will improve Description: Including pain rating scale, medication(s)/side effects and non-pharmacologic comfort measures Outcome: Not Progressing   Problem: Health Behavior/Discharge Planning: Goal: Ability to manage health-related needs will improve Outcome: Not Progressing   Problem: Clinical Measurements: Goal: Respiratory complications will improve Outcome: Not Progressing   Problem: Nutrition: Goal: Adequate nutrition will be maintained Outcome: Not Progressing   Problem: Coping: Goal: Level of anxiety will decrease Outcome: Not Progressing

## 2019-11-18 NOTE — Progress Notes (Signed)
  Brief Nutrition Note  Consult received for enteral/tube feeding initiation and management.  RD working remotely.  Adult Enteral Nutrition Protocol initiated. Full assessment to follow.  Admitting Dx: Hemoptysis [R04.2]  Body mass index is 33.95 kg/m. Pt meets criteria for obese based on current BMI.  Patient is currently intubated on ventilator support MV: 9.5 L/min Temp (24hrs), Avg:97.2 F (36.2 C), Min:96.1 F (35.6 C), Max:98.5 F (36.9 C)  Propofol: n/a Medications reviewed and include: Prontonix, Cyklokapron Drips: Fentanyl 150 mcg Levo 40 mcg Lactated ringers  Labs:  Recent Labs  Lab 11/17/19 0849 11/17/19 1823 11/18/19 0203  NA 132*  --  135  K 3.8  --  4.2  CL 100  --  103  CO2 22  --  22  BUN 15  --  14  CREATININE 1.04  --  1.21  CALCIUM 8.8*  --  8.4*  MG  --  2.3 2.4  PHOS  --  4.1 3.6  GLUCOSE 112*  --  133*    Matthew [name], RD, LDN Clinical Nutrition [facility] After Hours/Weekend Pager: [phone]

## 2019-11-18 NOTE — Progress Notes (Signed)
eLink Physician-Brief Progress Note Patient Name: Matthew [name] DOB: 12/24/1965 MRN: [id number]   Date of Service  11/18/2019  HPI/Events of Note  Still awake and coughing on Fentanyl 300 mcg. Not requiring pressors. Still with bloody secretions this time from the mouth  eICU Interventions  Added Versed drip for sedation PT INR normal, H/H stable, no desaturation     Intervention Category Minor Interventions: Agitation / anxiety - evaluation and management  Judd Lien 11/18/2019, 5:06 AM

## 2019-11-18 NOTE — Progress Notes (Signed)
Patient evaluated at bedside, very comfortable on 4 mg of Versed  Decision made to keep him ventilated at present  Attempt extubation in a.m. if no evidence of bleeding

## 2019-11-19 ENCOUNTER — Inpatient Hospital Stay (HOSPITAL_COMMUNITY): Payer: No Typology Code available for payment source

## 2019-11-19 LAB — GLUCOSE, CAPILLARY
Glucose-Capillary: 96 mg/dL (ref 70–99)
Glucose-Capillary: 95 mg/dL (ref 70–99)
Glucose-Capillary: 114 mg/dL — ABNORMAL HIGH (ref 70–99)
Glucose-Capillary: 142 mg/dL — ABNORMAL HIGH (ref 70–99)
Glucose-Capillary: 134 mg/dL — ABNORMAL HIGH (ref 70–99)
Glucose-Capillary: 133 mg/dL — ABNORMAL HIGH (ref 70–99)

## 2019-11-19 LAB — PHOSPHORUS: Phosphorus: 3.2 mg/dL (ref 2.5–4.6)

## 2019-11-19 LAB — MAGNESIUM: Magnesium: 2.3 mg/dL (ref 1.7–2.4)

## 2019-11-19 MED ORDER — LEVOFLOXACIN IN D5W 750 MG/150ML IV SOLN
750.0000 mg | INTRAVENOUS | Status: DC
  Administered 2019-11-19 – 2019-11-20 (×2): 750 mg via INTRAVENOUS
  Filled 2019-11-19 (×2): qty 150

## 2019-11-19 MED ORDER — FUROSEMIDE 10 MG/ML IJ SOLN
40.0000 mg | Freq: Once | INTRAMUSCULAR | Status: AC
  Administered 2019-11-19: 40 mg via INTRAVENOUS
  Filled 2019-11-19: qty 4

## 2019-11-19 MED ORDER — ALUM & MAG HYDROXIDE-SIMETH 200-200-20 MG/5ML PO SUSP
15.0000 mL | Freq: Once | ORAL | Status: AC
  Administered 2019-11-19: 23:00:00 30 mL via ORAL
  Filled 2019-11-19: qty 30

## 2019-11-19 NOTE — Progress Notes (Signed)
[facility] Progress Note Patient Name: Matthew [name] DOB: Apr 28, 1966 MRN: [id number]   Date of Service  11/19/2019  HPI/Events of Note  Acute urinary retention. Has already been straight cath'd x2 occasions prior to this.   eICU Interventions  Ordered foley catheter.     Intervention Category Minor Interventions: Other:  Charlott Rakes 11/19/2019, 12:12 AM

## 2019-11-19 NOTE — Progress Notes (Signed)
Chest x-ray reviewed  Bibasal infiltrate/atelectasis  Patient is awake, interactive, calm Tolerating pressure support  Not being suctioned for active bleed   We will proceed with extubation  He can protect his airway, does not appear to be bleeding actively I believe there is more risk to be had with leaving him intubated and on the ventilator

## 2019-11-19 NOTE — Progress Notes (Signed)
NAME:  Matthew [name], MRN:  [id number], DOB:  September 01, 1966, LOS: 2 ADMISSION DATE:  11/17/2019, CONSULTATION DATE:  11/17/2019 REFERRING MD: ED doc, CHIEF COMPLAINT: Hemoptysis  Brief History   Vomiting blood since 2/1 Was advised by Dr. Benjamine Mola of ENT to come to the ED, concern for GI bleed Patient taken to the endoscopy suite where he had EGD performed-EGD was unremarkable but was noted to be coughing up blood during the procedure, as the procedure was being concluded he had a significant amount of hemoptysis-about 200 cc of blood He was intubated and PCCM was consulted  Active smoker-about 78-pack-year smoking history Dad had cancer- lung cancer  Was scoped by Dr. Rutherford Guys evidence of oropharyngeal source of bleeding -History of nosebleeds as a child, some that required cauterization  Past Medical History:  Diagnosis Date  . Coronary artery disease per pt last cardiology visit 2011 (found documentation 12-05-2009 w/ dr Leonia Reeves with [facility] in epic)/  currently pt is followed by pcp   12-28-2008  PCI and DES x2 to proximal and mid RCA/ last cardiac cath 12-09-2009  widely patent stents  . GERD (gastroesophageal reflux disease)   . Hyperlipidemia   . Olecranon bursitis of left elbow   . S/P right coronary artery (RCA) stent placement 12/28/2008   PCI and DES x2-- proximal and mid RCA  . Wears dentures    UPPER    Significant Hospital Events   EGD 2/5-unremarkable Bronchoscopy 2/5-clot in the left upper lobe Uneventful night2/7  Consults:  PCCM GI  Procedures:  Bronchoscopy 11/17/2019 EGD 11/17/2019  Significant Diagnostic Tests:  Chest x-ray with no acute infiltrate-reviewed by myself CT scan of the chest reviewed IMPRESSION: 1. There is dense consolidation of the lingula, and the lingular bronchus appears obstructed near the origin (series 7, image 70). This may be by clot, as reported on bronchoscopy. 2. There is some lucency within the lingular  consolidation concerning for a developing cavitation (series 7, image 78). 3. Small bilateral pleural effusions and dependent bibasilar atelectasis or consolidation. 4. Enlarged pretracheal lymph nodes measuring up to 2.4 x 1.3 cm, nonspecific and perhaps reactive. 5.  Emphysema (ICD10-J43.9). 6. Small, brightly hyperenhancing foci in the left lobe of the liver and right lobe of the liver. These are nonspecific and could represent flash filling hemangiomas or transient perfusion artifacts. Consider multiphasic contrast enhanced MRI to further evaluate on a nonacute basis when clinically appropriate. 7. Coronary artery disease. Micro Data:  Washings left upper lobe 2/5-no organism  Antimicrobials:  None  Interim history/subjective:  Intubated 11/16/54/2021 Uneventful night Once he started waking up a little bit better today, was coughing, suction for altered blood very minimal red blood  Objective   Blood pressure 103/65, pulse 78, temperature 99.1 F (37.3 C), temperature source Axillary, resp. rate 16, height 5\' 11"  (1.803 m), weight 110.6 kg, SpO2 94 %.    Vent Mode: PSV;CPAP FiO2 (%):  [40 %-50 %] 40 % Set Rate:  [16 bmp] 16 bmp Vt Set:  [600 mL] 600 mL PEEP:  [5 cmH20] 5 cmH20 Pressure Support:  [5 cmH20] 5 cmH20 Plateau Pressure:  [10 [id number] cmH20] 19 cmH20   Intake/Output Summary (Last 24 hours) at 11/19/2019 0847 Last data filed at 11/19/2019 0836 Gross per 24 hour  Intake 2614.16 ml  Output 1025 ml  Net 1589.16 ml   Filed Weights   11/17/19 1418 11/18/19 0600 11/19/19 0604  Weight: 108.9 kg 110.4 kg 110.6 kg    Examination: General: 54 years old, does not  appear to be in distress HENT: Moist oral mucosa, endotracheal tube in place  lungs: Clear breath sounds bilaterally Cardiovascular: S1-S2 appreciated Abdomen: Bowel sounds appreciated Extremities: No clubbing, no edema Neuro: Sedated GU: Foley in place  Resolved Hospital Problem list     Assessment &  Plan:  Hemoptysis -Post bronchoscopy with a clot in left upper lobe -Minimal bleeding -Airway was patent-notable during bronchoscopy -CT with atelectasis consistent with clot obstructing the lingula -Mediastinal adenopathy-needs follow-up  Abnormal CT scan of the chest -Mediastinal adenopathy  Acute respiratory failure -We will continue ventilator support -Continue weaning with hope to extubate today  Hemoptysis -Continue tranexamic acid nebulization 500 mg 3 times daily  SCD for DVT prophylaxis  Leukocytosis -This is likely reactive   Risk of significant hemoptysis still exist however he is being treated with tranexamic acid at present Not been suction for significant amounts of blood The risk with developing healthcare associated pneumonia or further decompensation if left on the ventilator for a prolonged period as well  Best practice:  Diet: N.p.o. Pain/Anxiety/Delirium protocol (if indicated): Versed, fentanyl VAP protocol (if indicated): In place DVT prophylaxis: SCD, heparin GI prophylaxis: None Glucose control: Monitor Mobility: Bedrest Code Status: Full code Family Communication: Discussed with spouse 2/6 Disposition: ICU  Labs   CBC: Recent Labs  Lab 11/17/19 0849 11/18/19 0203  WBC 10.6* 23.8*  HGB 14.9 13.8  HCT 43.5 42.3  MCV 93.1 95.5  PLT 210 [id number]    Basic Metabolic Panel: Recent Labs  Lab 11/17/19 0849 11/17/19 1823 11/18/19 0203 11/18/19 1717 11/19/19 0317  NA 132*  --  135  --   --   K 3.8  --  4.2  --   --   CL 100  --  103  --   --   CO2 22  --  22  --   --   GLUCOSE 112*  --  133*  --   --   BUN 15  --  14  --   --   CREATININE 1.04  --  1.21  --   --   CALCIUM 8.8*  --  8.4*  --   --   MG  --  2.3 2.4 2.3 2.3  PHOS  --  4.1 3.6 3.6 3.2   GFR: Estimated Creatinine Clearance: 87.2 mL/min (by C-G formula based on SCr of 1.21 mg/dL). Recent Labs  Lab 11/17/19 0849 11/18/19 0203  WBC 10.6* 23.8*    Liver Function  Tests: Recent Labs  Lab 11/17/19 0849  AST 23  ALT 28  ALKPHOS 93  BILITOT 0.6  PROT 7.9  ALBUMIN 4.4   No results for input(s): LIPASE, AMYLASE in the last 168 hours. No results for input(s): AMMONIA in the last 168 hours.  ABG    Component Value Date/Time   PHART 7.317 (L) 11/17/2019 1618   PCO2ART 50.9 (H) 11/17/2019 1618   PO2ART 109 (H) 11/17/2019 1618   HCO3 25.3 11/17/2019 1618   ACIDBASEDEF 0.8 11/17/2019 1618   O2SAT 97.5 11/17/2019 1618     Coagulation Profile: Recent Labs  Lab 11/18/19 0410  INR 1.0    Cardiac Enzymes: No results for input(s): CKTOTAL, CKMB, CKMBINDEX, TROPONINI in the last 168 hours.  HbA1C: No results found for: HGBA1C  CBG: Recent Labs  Lab 11/18/19 1647 11/18/19 1938 11/18/19 2329 11/19/19 0422 11/19/19 0755  GLUCAP 81 95 96 95 114*    Review of Systems:   Unobtainable  Past Medical History  He,  has a past medical history of Coronary artery disease (per pt last cardiology visit 2011 (found documentation 12-05-2009 w/ dr Leonia Reeves with [facility] in epic)/  currently pt is followed by pcp), GERD (gastroesophageal reflux disease), Hyperlipidemia, Olecranon bursitis of left elbow, S/P right coronary artery (RCA) stent placement (12/28/2008), and Wears dentures.   Surgical History    Past Surgical History:  Procedure Laterality Date  . CARDIAC CATHETERIZATION  12-09-2009  dr Ilda Foil   abnormal stress test (12-05-2009) widely patent coronaries present w/ evidence of diffuse atherosclerotic disease/  widely patent stented segments in the proximal and mid segment RCA/  LVEF 65%  . CORONARY ANGIOPLASTY WITH STENT PLACEMENT  12-28-2008  dr berry   PCI and stenting of the proxmial and mid RCA using cutting balloon atherectomy (x2 DES, Xience)/  LVEF >60% w/ normal wall motion  . KNEE ARTHROSCOPY W/ MENISCECTOMY Right 2002  . NEUROPLASTY / TRANSPOSITION ULNAR NERVE AT ELBOW Right 10/2014   dr Caralyn Guile  . OLECRANON BURSECTOMY Left  05/06/2017   Procedure: LEFT ELBOW OLECRANON BURSA EXCISION;  Surgeon: Iran Planas, MD;  Location: [facility];  Service: Orthopedics;  Laterality: Left;  . ORIF RIGHT ANKLE FX  2006  . TONSILLECTOMY  age 54  . TRANSTHORACIC ECHOCARDIOGRAM  03/30/2016   mild concentric LVH, ef 50-55%/  trivial TR     Social History   reports that he has been smoking cigarettes. He has a 78.00 pack-year smoking history. He has never used smokeless tobacco. He reports current alcohol use. He reports that he does not use drugs.   Family History   His family history includes Cancer in his father and mother.   Allergies Allergies  Allergen Reactions  . Codeine Nausea And Vomiting  . Onion Swelling    "thoart swells up"     The patient is critically ill with multiple organ systems failure and requires high complexity decision making for assessment and support, frequent evaluation and titration of therapies, application of advanced monitoring technologies and extensive interpretation of multiple databases. Critical Care Time devoted to patient care services described in this note independent of APP/resident time (if applicable)  is 32 minutes.   Sherrilyn Rist MD [facility] Pulmonary Critical Care Personal pager: [phone] If unanswered, please page CCM On-call: [phone]

## 2019-11-19 NOTE — Progress Notes (Signed)
Progress Progress Note Patient Name: Matthew [name] DOB: 09/21/1966 MRN: [id number]   Date of Service  11/19/2019  HPI/Events of Note  Heartburn/indigestion  eICU Interventions  Ordered GI cocktail     Intervention Category Minor Interventions: Other:  Charlott Rakes 11/19/2019, 9:24 PM

## 2019-11-19 NOTE — Plan of Care (Signed)
Patient is sedated and on a ventilator.  Problem: Education: Goal: Knowledge of General Education information will improve Description: Including pain rating scale, medication(s)/side effects and non-pharmacologic comfort measures Outcome: Not Progressing   Problem: Health Behavior/Discharge Planning: Goal: Ability to manage health-related needs will improve Outcome: Not Progressing   Problem: Clinical Measurements: Goal: Respiratory complications will improve Outcome: Progressing   Problem: Nutrition: Goal: Adequate nutrition will be maintained Outcome: Not Progressing   Problem: Coping: Goal: Level of anxiety will decrease Outcome: Not Progressing   Problem: Pain Managment: Goal: General experience of comfort will improve Outcome: Progressing

## 2019-11-19 NOTE — Procedures (Signed)
Extubation Procedure Note  Patient Details:   Name: Matthew [name] DOB: [birth year] MRN: [id number]   Airway Documentation:  Airway (Active)  Secured at (cm) 25 cm 11/19/19 0836  Measured From Lips 11/19/19 0836  Secured Location Right 11/19/19 0818  Secured By [employer] 11/19/19 0836  Tube Holder Repositioned Yes 11/19/19 0818  Cuff Pressure (cm H2O) 28 cm H2O 11/19/19 0803  Site Condition Dry 11/19/19 0836   Vent end date: (not recorded) Vent end time: (not recorded)   Evaluation  O2 sats: stable throughout Complications: No apparent complications Patient did tolerate procedure well. Bilateral Breath Sounds: Clear, Diminished   Yes  Johnette Abraham 11/19/2019, 10:30 AM

## 2019-11-20 ENCOUNTER — Encounter: Payer: Self-pay | Admitting: *Deleted

## 2019-11-20 ENCOUNTER — Inpatient Hospital Stay (HOSPITAL_COMMUNITY): Payer: No Typology Code available for payment source

## 2019-11-20 DIAGNOSIS — J9690 Respiratory failure, unspecified, unspecified whether with hypoxia or hypercapnia: Secondary | ICD-10-CM

## 2019-11-20 DIAGNOSIS — J9601 Acute respiratory failure with hypoxia: Secondary | ICD-10-CM

## 2019-11-20 LAB — CBC WITH DIFFERENTIAL/PLATELET
Abs Immature Granulocytes: 0.05 K/uL (ref 0.00–0.07)
Basophils Absolute: 0.1 K/uL (ref 0.0–0.1)
Basophils Relative: 1 %
Eosinophils Absolute: 0.1 K/uL (ref 0.0–0.5)
Eosinophils Relative: 1 %
HCT: 38.4 % — ABNORMAL LOW (ref 39.0–52.0)
Hemoglobin: 12.8 g/dL — ABNORMAL LOW (ref 13.0–17.0)
Immature Granulocytes: 0 %
Lymphocytes Relative: 14 %
Lymphs Abs: 1.8 K/uL (ref 0.7–4.0)
MCH: 31.3 pg (ref 26.0–34.0)
MCHC: 33.3 g/dL (ref 30.0–36.0)
MCV: 93.9 fL (ref 80.0–100.0)
Monocytes Absolute: 0.8 K/uL (ref 0.1–1.0)
Monocytes Relative: 6 %
Neutro Abs: 10.4 K/uL — ABNORMAL HIGH (ref 1.7–7.7)
Neutrophils Relative %: 78 %
Platelets: 195 K/uL (ref 150–400)
RBC: 4.09 MIL/uL — ABNORMAL LOW (ref 4.22–5.81)
RDW: 12.6 % (ref 11.5–15.5)
WBC: 13.2 K/uL — ABNORMAL HIGH (ref 4.0–10.5)
nRBC: 0 % (ref 0.0–0.2)

## 2019-11-20 LAB — BASIC METABOLIC PANEL WITH GFR
Anion gap: 10 (ref 5–15)
BUN: 13 mg/dL (ref 6–20)
CO2: 25 mmol/L (ref 22–32)
Calcium: 8.5 mg/dL — ABNORMAL LOW (ref 8.9–10.3)
Chloride: 101 mmol/L (ref 98–111)
Creatinine, Ser: 0.98 mg/dL (ref 0.61–1.24)
GFR calc Af Amer: >60 mL/min
GFR calc non Af Amer: >60 mL/min
Glucose, Bld: 97 mg/dL (ref 70–99)
Potassium: 3.3 mmol/L — ABNORMAL LOW (ref 3.5–5.1)
Sodium: 136 mmol/L (ref 135–145)

## 2019-11-20 LAB — GLUCOSE, CAPILLARY
Glucose-Capillary: 92 mg/dL (ref 70–99)
Glucose-Capillary: 85 mg/dL (ref 70–99)

## 2019-11-20 LAB — BODY FLUID CULTURE: Gram Stain: NONE SEEN

## 2019-11-20 MED ORDER — POTASSIUM CHLORIDE CRYS ER 20 MEQ PO TBCR
20.0000 meq | EXTENDED_RELEASE_TABLET | ORAL | Status: AC
  Administered 2019-11-20 (×2): 20 meq via ORAL
  Filled 2019-11-20 (×2): qty 1

## 2019-11-20 MED ORDER — ASPIRIN EC 81 MG PO TBEC: 81.0000 mg | DELAYED_RELEASE_TABLET | Freq: Every day | ORAL | 0 refills | Status: DC

## 2019-11-20 MED ORDER — PANTOPRAZOLE SODIUM 40 MG PO TBEC: 40.0000 mg | DELAYED_RELEASE_TABLET | Freq: Two times a day (BID) | ORAL | Status: DC

## 2019-11-20 MED ORDER — CEFDINIR 300 MG PO CAPS
300.0000 mg | ORAL_CAPSULE | Freq: Two times a day (BID) | ORAL | Status: DC
  Administered 2019-11-20: 300 mg via ORAL
  Filled 2019-11-20 (×2): qty 1

## 2019-11-20 MED ORDER — CEFDINIR 300 MG PO CAPS: 300.0000 mg | ORAL_CAPSULE | Freq: Two times a day (BID) | ORAL | 0 refills | Status: AC

## 2019-11-20 MED ORDER — PANTOPRAZOLE SODIUM 40 MG PO TBEC: 40.0000 mg | DELAYED_RELEASE_TABLET | Freq: Two times a day (BID) | ORAL | 0 refills | Status: DC

## 2019-11-20 NOTE — Progress Notes (Signed)
[facility] ADULT ICU REPLACEMENT PROTOCOL FOR AM LAB REPLACEMENT ONLY  The patient does apply for the [facility] Adult ICU Electrolyte Replacment Protocol based on the criteria listed below:   1. Is GFR >/= 40 ml/min? yes  Patient's GFR today is >60  2. Is urine output >/= 0.5 ml/kg/hr for the last 6 hours? yes  Patient's UOP is 0.28ml/kg/hr 3. Is BUN < 60 mg/dL? Yes  Patient's BUN today is 13  4. Abnormal electrolyte(s):K+3.3.  5 Ordered repletion with: potassium 20 meq PO Q4H x2 doses

## 2019-11-20 NOTE — Discharge Summary (Signed)
Physician Discharge Summary  Patient ID: ELIAN GLOSTER MRN: [id number] DOB/AGE: 56-Jun-1967 56 y.o.  Admit date: 11/17/2019 Discharge date: 11/20/2019    Discharge Diagnoses:  -Hemoptysis -Abnormal CT scan of the chest -Acute respiratory failure -Leukocytosis     DISCHARGE PLAN BY DIAGNOSIS    Discharge Plan:  Hemoptysis Abnormal CT scan of the chest -Post bronchoscopy with a clot in left upper lobe -No reported bleeding post extubation 2/7 -CT with atelectasis consistent with clot obstructing the lingula -S/P nebulized tranexamic acid  P: Encourage pulmonary hygiene Mediastinal adenopathy will need follow up imagine upon discharge  F/U apt with Dr. Ander Slade 2/22 at 1400 Will need repeat CT chest in 6week  ASA decreased to 62m daily (patient states cardiologist recommended this as well) he was instructed to stop this if bleeding reccures                    DISCHARGE SUMMARY  Patient is a 54 year old male with a history of CAD status post stents, and ongoing tobacco abuse who presents originally 2/2 with complaints of coughing up blood.  Symptoms started 2/1 he had 3 or 4 separate episodes.  He had a several hour episode throughout the night where he was coughing up and blowing his nose with large amounts of bright red blood.  It stopped about 4 AM.  It was coming out of both sides of his nose and down the back of his throat.  He denies any shortness of breath but does report that in the last week or so he has had significant sinus congestion.  He does have a prior history as a kid of having frequent nosebleeds and cauterizations. At this ED visit he was prescribed antibiotics and discharged from ED.   When symptoms did not improve he was seen by outpatient ENT who preformed scope with no evidence of bleeding seen. Patient states bleeding began again night of 2/5 at which time he presented back to the ED.   Patient was then taken to the endoscopy suite where he had EGD  performed-EGD was unremarkable but was noted to be coughing up blood during the procedure, as the procedure was being concluded he had a significant amount of hemoptysis-about 200 cc of blood. He was intubated and PCCM was consulted  He received nebulized Transexmic acid with resolution of hemoptysis seen. He was successfully extubated afternoon of 2/7. Morning of discharge 2/8 he remains stable, on room air, tolerating oral diet, and ambulating per baseline. Therefore, he will be discharged home today. Patient will need to follow up with outpatient pulmonology at discharge and voices understanding, follow up apt has been made.   SIGNIFICANT DIAGNOSTIC STUDIES  Chest x-ray with no acute infiltrate-reviewed by myself CT scan of the chest reviewed 1. There is dense consolidation of the lingula, and the lingular bronchus appears obstructed near the origin (series 7, image 70). This may be by clot, as reported on bronchoscopy. 2. There is some lucency within the lingular consolidation concerning for a developing cavitation (series 7, image 78). 3. Small bilateral pleural effusions and dependent bibasilar atelectasis or consolidation. 4. Enlarged pretracheal lymph nodes measuring up to 2.4 x 1.3 cm, nonspecific and perhaps reactive. 5. Emphysema (ICD10-J43.9). 6. Small, brightly hyperenhancing foci in the left lobe of the liver and right lobe of the liver. These are nonspecific and could represent flash filling hemangiomas or transient perfusion artifacts. Consider multiphasic contrast enhanced MRI to further evaluate on a nonacute basis when clinically appropriate. 7.  Coronary artery disease.  MICRO DATA  Washings left upper lobe 2/5-no organism  ANTIBIOTICS None  CONSULTS GI  TUBES / LINES  Discharge Exam: General: Adult  male/male sitting up in bedside recliner, in NAD HEENT: [place]/AT, MM pink/moist, PERRL,  Neuro: Alert and oriented x3, non-focal  CV: s1s2 regular rate and  rhythm, no murmur, rubs, or gallops,  PULM:  Slightly diminished posterior right lower bases, no added breath sounds, oxygen saturations 91-97% on room air  GI: soft, bowel sounds active in all 4 quadrants, non-tender, non-distended Extremities: warm/dry, no edema  Skin: no rashes or lesions  Vitals:   11/20/19 0800 11/20/19 0900 11/20/19 1000 11/20/19 1100  BP:  (!) 146/88 (!) 160/96 (!) 148/80  Pulse: 87 79 76 70  Resp: (!) 25 (!) 24 17 (!) 24  Temp: (!) 97.4 F (36.3 C)     TempSrc: Oral     SpO2: 94% 97% 96% 96%  Weight:      Height:       Discharge Labs  BMET Recent Labs  Lab 11/17/19 0849 11/17/19 0849 11/17/19 1823 11/18/19 0203 11/18/19 1717 11/19/19 0317 11/20/19 0311  NA 132*  --   --  135  --   --  136  K 3.8   < >  --  4.2  --   --  3.3*  CL 100  --   --  103  --   --  101  CO2 22  --   --  22  --   --  25  GLUCOSE 112*  --   --  133*  --   --  97  BUN 15  --   --  14  --   --  13  CREATININE 1.04  --   --  1.21  --   --  0.98  CALCIUM 8.8*  --   --  8.4*  --   --  8.5*  MG  --   --  2.3 2.4 2.3 2.3  --   PHOS  --   --  4.1 3.6 3.6 3.2  --    < > = values in this interval not displayed.    CBC Recent Labs  Lab 11/17/19 0849 11/18/19 0203 11/20/19 0311  HGB 14.9 13.8 12.8*  HCT 43.5 42.3 38.4*  WBC 10.6* 23.8* 13.2*  PLT 210 211 195    Anti-Coagulation Recent Labs  Lab 11/18/19 0410  INR 1.0    Allergies as of 11/20/2019      Reactions   Codeine Nausea And Vomiting   Onion Swelling   "thoart swells up"      Medication List    STOP taking these medications   amoxicillin 500 MG capsule Commonly known as: AMOXIL   oxymetazoline 0.05 % nasal spray Commonly known as: AFRIN     TAKE these medications   aspirin EC 81 MG tablet Take 1 tablet (81 mg total) by mouth daily. What changed:   medication strength  how much to take  when to take this   cefdinir 300 MG capsule Commonly known as: OMNICEF Take 1 capsule (300 mg total)  by mouth every 12 (twelve) hours for 7 days.   pantoprazole 40 MG tablet Commonly known as: PROTONIX Take 1 tablet (40 mg total) by mouth 2 (two) times daily.   Vitamin B-12 5000 MCG Tbdp Take 5,000 Units by mouth daily as needed.      Disposition:   Discharged Condition: [place] Harmes Zender  has met maximum benefit of inpatient care and is medically stable and cleared for discharge.  Patient is pending follow up as above.     Time spent on disposition:  35 Minutes.   Signed: Johnsie Cancel, NP-C [facility] Contact / Pager information can be found on Amion  11/20/2019, 12:13 PM

## 2019-11-20 NOTE — Progress Notes (Signed)
eLink Physician-Brief Progress Note Patient Name: THIEN SIA DOB: 1966/07/04 MRN: [id number]   Date of Service  11/20/2019  HPI/Events of Note  RN requests POCT glucose checks be discontinued given no diabetes diagnosis and patient is now on a diet and eating.   eICU Interventions  Reviewed prior POCT glucose values and all are within normal range.  Discontinued POCT glucose checks.     Intervention Category Minor Interventions: Routine modifications to care plan (e.g. PRN medications for pain, fever)  Marily Lente Korrin Waterfield 11/20/2019, 3:43 AM

## 2019-11-21 LAB — CYTOLOGY - NON PAP

## 2019-11-22 ENCOUNTER — Telehealth: Payer: Self-pay | Admitting: Internal Medicine

## 2019-11-22 NOTE — Telephone Encounter (Signed)
LMTCB x1 for pt.  

## 2019-11-22 NOTE — Telephone Encounter (Signed)
Spoke with Matthew [name]. He wants to see CY for his hospital follow up. Advised him that CY is not taking on new patients at this time. Matthew [name] states that CY treated his Dad, Matthew [name] and would feel more comfortable with CY than Dr. Ander Slade because of this.  CY - please advise if you are okay with seeing the Matthew [name]. Thanks.

## 2019-11-22 NOTE — Telephone Encounter (Signed)
Pt calling to talk to Dr. Annamaria Boots to establish care with him. Pt stated that he had a couple of his family members that were Dr. Annamaria Boots patients and he would like to become on his patients also. Pt can be reached at [phone].

## 2019-11-22 NOTE — Telephone Encounter (Signed)
Pt returning a phone call.

## 2019-11-22 NOTE — Telephone Encounter (Signed)
Spoke with pt. He has been scheduled with CY on 12/04/19 at 1130. Nothing further was needed.

## 2019-11-22 NOTE — Telephone Encounter (Signed)
I can see him

## 2019-12-04 ENCOUNTER — Ambulatory Visit (INDEPENDENT_AMBULATORY_CARE_PROVIDER_SITE_OTHER): Payer: No Typology Code available for payment source

## 2019-12-04 ENCOUNTER — Ambulatory Visit (INDEPENDENT_AMBULATORY_CARE_PROVIDER_SITE_OTHER): Payer: Self-pay | Admitting: Internal Medicine

## 2019-12-04 ENCOUNTER — Other Ambulatory Visit: Payer: Self-pay

## 2019-12-04 ENCOUNTER — Encounter: Payer: Self-pay | Admitting: Internal Medicine

## 2019-12-04 ENCOUNTER — Inpatient Hospital Stay: Payer: No Typology Code available for payment source | Admitting: Pulmonary Disease

## 2019-12-04 VITALS — BP 118/78 | HR 69 | Temp 97.4°F | Ht 71.0 in | Wt 239.0 lb

## 2019-12-04 DIAGNOSIS — Z720 Tobacco use: Secondary | ICD-10-CM

## 2019-12-04 DIAGNOSIS — R042 Hemoptysis: Secondary | ICD-10-CM

## 2019-12-04 DIAGNOSIS — J9601 Acute respiratory failure with hypoxia: Secondary | ICD-10-CM

## 2019-12-04 LAB — CBC WITH DIFFERENTIAL/PLATELET
Basophils Absolute: 0.1 10*3/uL (ref 0.0–0.1)
Basophils Relative: 1 % (ref 0.0–3.0)
Eosinophils Absolute: 0.2 10*3/uL (ref 0.0–0.7)
Eosinophils Relative: 1.7 % (ref 0.0–5.0)
HCT: 40.6 % (ref 39.0–52.0)
Hemoglobin: 13.9 g/dL (ref 13.0–17.0)
Lymphocytes Relative: 26.3 % (ref 12.0–46.0)
Lymphs Abs: 2.6 10*3/uL (ref 0.7–4.0)
MCHC: 34.3 g/dL (ref 30.0–36.0)
MCV: 92.8 fl (ref 78.0–100.0)
Monocytes Absolute: 0.9 10*3/uL (ref 0.1–1.0)
Monocytes Relative: 8.7 % (ref 3.0–12.0)
Neutro Abs: 6.1 10*3/uL (ref 1.4–7.7)
Neutrophils Relative %: 62.3 % (ref 43.0–77.0)
Platelets: 252 10*3/uL (ref 150.0–400.0)
RBC: 4.38 Mil/uL (ref 4.22–5.81)
RDW: 13.6 % (ref 11.5–15.5)
WBC: 9.9 10*3/uL (ref 4.0–10.5)

## 2019-12-04 LAB — PROTIME-INR
INR: 1 ratio (ref 0.8–1.0)
Prothrombin Time: 11.1 s (ref 9.6–13.1)

## 2019-12-04 NOTE — Assessment & Plan Note (Signed)
Hopefully he has cleared enough clot to open LUL. We will check current status w CXR, anticipating repeat CT. Depending on imaging and any recurrence, decision will be made about PET, repeat bronchoscopy, possible bx. He is to stay off aspirin and NSAIDs for now, use Tylenol if needed. These issues were discussed with patient and wife in detail, quesstions answered.

## 2019-12-04 NOTE — Patient Instructions (Signed)
Order- CXR   Dx hemoptysis, tobacco use  Order- lab- CBC w diff, Prothrombin Time, BMET    Dx hemoptysis  Order- schedule PFT    Dx hemoptysis  Please call if we can help

## 2019-12-04 NOTE — Progress Notes (Signed)
12/04/19- 1 yom smoker ( 78 pack years) for hosp f/u after w/u for hemoptysis. He asked for me because I had cared for father who had lung cancer, as well as his sister and mother. Hosp 2/5-11/20/19 for large volume hemoptysis/ tranexamic acid inhalation Rx, acute respiratory failure/ intubation, abnormal CT chest, leukocytosis, mediastinal adenopathy Bronchoscopy- clot in LUL bronchus Needs repeat CT chest in 6 weeks= mid March. ASA was prescribed as 81 mg after stents years ago, but only taken occasionally.  . Taking only Protonix     Does not get flu vax. Medical hx CAD/ stents, tobacco abuse, hyperlipidemia, GERD,  Outpatient ENT/ Dr Benjamine Mola scoped Neg. Endoscopy- Neg  Hx childhood frequent epistaxis and cauterization. No hx of prior hemoptysis.  Wife says he had developed a harsh, deep cough in recent years. He has not smoked since 2/5 and both he and wife say he has almost completely stopped coughing. Did cough up some clots of old blood in first days after he got home. No wheeze, fever, adenopathy or other bleeding.  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  Rare ETOH without known liver disease. CT chest 11/17/19-  IMPRESSION: 1. There is dense consolidation of the lingula, and the lingular bronchus appears obstructed near the origin (series 7, image 70). This may be by clot, as reported on bronchoscopy. 2. There is some lucency within the lingular consolidation concerning for a developing cavitation (series 7, image 78). 3. Small bilateral pleural effusions and dependent bibasilar atelectasis or consolidation. 4. Enlarged pretracheal lymph nodes measuring up to 2.4 x 1.3 cm, nonspecific and perhaps reactive. 5.  Emphysema (ICD10-J43.9). 6. Small, brightly hyperenhancing foci in the left lobe of the liver and right lobe of the liver. These are nonspecific and could represent flash filling hemangiomas or transient perfusion artifacts. Consider multiphasic contrast  enhanced MRI to further evaluate on a nonacute basis when clinically appropriate. 7. Coronary artery disease.  Prior to Admission medications   Medication Sig Start Date End Date Taking? Authorizing Provider  pantoprazole (PROTONIX) 40 MG tablet Take 1 tablet (40 mg total) by mouth 2 (two) times daily. 11/20/19  Yes Johnsie Cancel, NP   Past Medical History:  Diagnosis Date  . Coronary artery disease per pt last cardiology visit 2011 (found documentation 12-05-2009 w/ dr Leonia Reeves with [facility] in epic)/  currently pt is followed by pcp   12-28-2008  PCI and DES x2 to proximal and mid RCA/ last cardiac cath 12-09-2009  widely patent stents  . GERD (gastroesophageal reflux disease)   . Hyperlipidemia   . Olecranon bursitis of left elbow   . S/P right coronary artery (RCA) stent placement 12/28/2008   PCI and DES x2-- proximal and mid RCA  . Wears dentures    UPPER   Past Surgical History:  Procedure Laterality Date  . CARDIAC CATHETERIZATION  12-09-2009  dr Ilda Foil   abnormal stress test (12-05-2009) widely patent coronaries present w/ evidence of diffuse atherosclerotic disease/  widely patent stented segments in the proximal and mid segment RCA/  LVEF 65%  . CORONARY ANGIOPLASTY WITH STENT PLACEMENT  12-28-2008  dr berry   PCI and stenting of the proxmial and mid RCA using cutting balloon atherectomy (x2 DES, Xience)/  LVEF >60% w/ normal wall motion  . ESOPHAGOGASTRODUODENOSCOPY N/A 11/17/2019   Procedure: ESOPHAGOGASTRODUODENOSCOPY (EGD);  Surgeon: Carol Ada, MD;  Location: [facility];  Service: Gastroenterology;  Laterality: N/A;  . KNEE ARTHROSCOPY W/ MENISCECTOMY Right 2002  . NEUROPLASTY /  TRANSPOSITION ULNAR NERVE AT ELBOW Right 10/2014   dr Caralyn Guile  . OLECRANON BURSECTOMY Left 05/06/2017   Procedure: LEFT ELBOW OLECRANON BURSA EXCISION;  Surgeon: Iran Planas, MD;  Location: [facility];  Service: Orthopedics;  Laterality: Left;  . ORIF RIGHT ANKLE FX  2006  .  TONSILLECTOMY  age 38  . TRANSTHORACIC ECHOCARDIOGRAM  03/30/2016   mild concentric LVH, ef 50-55%/  trivial TR   Family History  Problem Relation Age of Onset  . Cancer Mother   . Cancer Father    Social History   Socioeconomic History  . Marital status: Married    Spouse name: Not on file  . Number of children: Not on file  . Years of education: Not on file  . Highest education level: Not on file  Occupational History  . Not on file  Tobacco Use  . Smoking status: Former Smoker    Packs/day: 2.00    Years: 39.00    Pack years: 78.00    Types: Cigarettes    Quit date: 11/17/2019    Years since quitting: 0.0  . Smokeless tobacco: Never Used  . Tobacco comment: since age 51  Substance and Sexual Activity  . Alcohol use: Yes    Comment: seldom  . Drug use: No  . Sexual activity: Not on file  Other Topics Concern  . Not on file  Social History Narrative  . Not on file   Social Determinants of Health   Financial Resource Strain:   . Difficulty of Paying Living Expenses: Not on file  Food Insecurity:   . Worried About Charity fundraiser in the Last Year: Not on file  . Ran Out of Food in the Last Year: Not on file  Transportation Needs:   . Lack of Transportation (Medical): Not on file  . Lack of Transportation (Non-Medical): Not on file  Physical Activity:   . Days of Exercise per Week: Not on file  . Minutes of Exercise per Session: Not on file  Stress:   . Feeling of Stress : Not on file  Social Connections:   . Frequency of Communication with Friends and Family: Not on file  . Frequency of Social Gatherings with Friends and Family: Not on file  . Attends Religious Services: Not on file  . Active Member of Clubs or Organizations: Not on file  . Attends Archivist Meetings: Not on file  . Marital Status: Not on file  Intimate Partner Violence:   . Fear of Current or Ex-Partner: Not on file  . Emotionally Abused: Not on file  . Physically Abused:  Not on file  . Sexually Abused: Not on file   ROS-see HPI   + = positive Constitutional:    weight loss, night sweats, fevers, chills, fatigue, lassitude. HEENT:    headaches, difficulty swallowing, tooth/dental problems, sore throat,       sneezing, itching, ear ache, nasal congestion, post nasal drip, snoring CV:    chest pain, orthopnea, PND, swelling in lower extremities, anasarca,                                  dizziness, palpitations Resp:   shortness of breath with exertion or at rest.                +productive cough,   non-productive cough, coughing up of blood.  change in color of mucus.  wheezing.   Skin:    rash or lesions. GI:  No-   heartburn, indigestion, abdominal pain, nausea, vomiting, diarrhea,                 change in bowel habits, loss of appetite GU: dysuria, change in color of urine, no urgency or frequency.   flank pain. MS:   joint pain, stiffness, decreased range of motion, back pain. Neuro-     nothing unusual Psych:  change in mood or affect.  depression or anxiety.   memory loss.  OBJ- Physical Exam General- Alert, Oriented, Affect-appropriate, Distress- none acute, + overweight Skin- rash-none, lesions- none, excoriation- none Lymphadenopathy- none Head- atraumatic            Eyes- Gross vision intact, PERRLA, conjunctivae and secretions clear            Ears- Hearing, canals-normal            Nose- Clear, no-Septal dev, mucus, polyps, erosion, perforation             Throat- Mallampati II , mucosa clear , drainage- none, tonsils- atrophic Neck- flexible , trachea midline, no stridor , thyroid nl, carotid no bruit Chest - symmetrical excursion , unlabored           Heart/CV- RRR , no murmur , no gallop  , no rub, nl s1 s2                           - JVD +1 cm , edema- none, stasis changes- none, varices- none           Lung- clear to P&A, wheeze- none, cough- none , dullness-none, rub- none           Chest wall-  Abd-  Br/ Gen/ Rectal-  Not done, not indicated Extrem- cyanosis- none, clubbing, none, atrophy- none, strength- nl Neuro- grossly intact to observation

## 2019-12-04 NOTE — Assessment & Plan Note (Signed)
He seems to feel he can stay off cigs. Importance emphasized. Plan- PFT

## 2019-12-05 LAB — BASIC METABOLIC PANEL
BUN: 12 mg/dL (ref 6–23)
CO2: 27 mEq/L (ref 19–32)
Calcium: 9.5 mg/dL (ref 8.4–10.5)
Chloride: 102 mEq/L (ref 96–112)
Creatinine, Ser: 1.05 mg/dL (ref 0.40–1.50)
GFR: 73.25 mL/min (ref >60.00)
Glucose, Bld: 76 mg/dL (ref 70–99)
Potassium: 4.2 mEq/L (ref 3.5–5.1)
Sodium: 136 mEq/L (ref 135–145)

## 2019-12-10 NOTE — Assessment & Plan Note (Signed)
Now comfortable on room air. Acute issue was likely from endobronchial bleeding. Unsure of baseline lung function after long smoking history. Plan- Anticipate PFT.

## 2020-02-06 ENCOUNTER — Other Ambulatory Visit (HOSPITAL_COMMUNITY)
Admission: RE | Admit: 2020-02-06 | Discharge: 2020-02-06 | Disposition: A | Discharge status: Home / Self Care | Payer: HRSA Program | Source: Ambulatory Visit | Attending: Internal Medicine | Admitting: Internal Medicine

## 2020-02-06 DIAGNOSIS — Z01812 Encounter for preprocedural laboratory examination: Secondary | ICD-10-CM | POA: Insufficient documentation

## 2020-02-06 DIAGNOSIS — Z20822 Contact with and (suspected) exposure to covid-19: Secondary | ICD-10-CM | POA: Diagnosis not present

## 2020-02-06 LAB — SARS CORONAVIRUS 2 (TAT 6-24 HRS): SARS Coronavirus 2: NEGATIVE

## 2020-02-08 ENCOUNTER — Other Ambulatory Visit: Payer: Self-pay | Admitting: *Deleted

## 2020-02-08 DIAGNOSIS — Z720 Tobacco use: Secondary | ICD-10-CM

## 2020-02-08 DIAGNOSIS — J9601 Acute respiratory failure with hypoxia: Secondary | ICD-10-CM

## 2020-02-09 ENCOUNTER — Ambulatory Visit (INDEPENDENT_AMBULATORY_CARE_PROVIDER_SITE_OTHER): Payer: Self-pay | Admitting: Internal Medicine

## 2020-02-09 ENCOUNTER — Other Ambulatory Visit: Payer: Self-pay

## 2020-02-09 DIAGNOSIS — J9601 Acute respiratory failure with hypoxia: Secondary | ICD-10-CM

## 2020-02-09 DIAGNOSIS — Z720 Tobacco use: Secondary | ICD-10-CM

## 2020-02-09 LAB — PULMONARY FUNCTION TEST
DL/VA % pred: 101 %
DL/VA: 4.4 ml/min/mmHg/L
DLCO cor % pred: 88 %
DLCO cor: 26.04 ml/min/mmHg
DLCO unc % pred: 88 %
DLCO unc: 26.04 ml/min/mmHg
FEF 25-75 Post: 3.19 L/sec
FEF 25-75 Pre: 2.98 L/sec
FEF2575-%Change-Post: 7 %
FEF2575-%Pred-Post: 96 %
FEF2575-%Pred-Pre: 89 %
FEV1-%Change-Post: 0 %
FEV1-%Pred-Post: 87 %
FEV1-%Pred-Pre: 87 %
FEV1-Post: 3.42 L
FEV1-Pre: 3.42 L
FEV1FVC-%Change-Post: 3 %
FEV1FVC-%Pred-Pre: 101 %
FEV6-%Change-Post: -3 %
FEV6-%Pred-Post: 86 %
FEV6-%Pred-Pre: 89 %
FEV6-Post: 4.23 L
FEV6-Pre: 4.39 L
FEV6FVC-%Pred-Post: 104 %
FEV6FVC-%Pred-Pre: 104 %
FVC-%Change-Post: -3 %
FVC-%Pred-Post: 83 %
FVC-%Pred-Pre: 86 %
FVC-Post: 4.23 L
FVC-Pre: 4.39 L
Post FEV1/FVC ratio: 81 %
Post FEV6/FVC ratio: 100 %
Pre FEV1/FVC ratio: 78 %
Pre FEV6/FVC Ratio: 100 %
RV % pred: 90 %
RV: 1.99 L
TLC % pred: 88 %
TLC: 6.35 L

## 2020-02-09 NOTE — Progress Notes (Signed)
Full PFT performed today. °

## 2020-02-12 ENCOUNTER — Encounter: Payer: Self-pay | Admitting: Internal Medicine

## 2020-02-12 ENCOUNTER — Other Ambulatory Visit: Payer: Self-pay

## 2020-02-12 ENCOUNTER — Ambulatory Visit (INDEPENDENT_AMBULATORY_CARE_PROVIDER_SITE_OTHER): Payer: No Typology Code available for payment source | Admitting: Internal Medicine

## 2020-02-12 DIAGNOSIS — J9601 Acute respiratory failure with hypoxia: Secondary | ICD-10-CM

## 2020-02-12 DIAGNOSIS — Z720 Tobacco use: Secondary | ICD-10-CM

## 2020-02-12 DIAGNOSIS — R042 Hemoptysis: Secondary | ICD-10-CM

## 2020-02-12 NOTE — Progress Notes (Signed)
HPI-  m smoker ( 78 pack years) for hosp f/u after w/u for hemoptysis, complicated by hx epistaxis, CT chest 11/17/19- 1. There is dense consolidation of the lingula, and the lingular bronchus appears obstructed near the origin (series 7, image 70). This may be by clot, as reported on bronchoscopy. 2. There is some lucency within the lingular consolidation concerning for a developing cavitation (series 7, image 78).  PFT 02/09/20-  wnl ! ------------------------------------------------------------------------------------------------------  12/04/19- 51 yom smoker ( 78 pack years) for hosp f/u after w/u for hemoptysis. He asked for me because I had cared for father who had lung cancer, as well as his sister and mother. Hosp 2/5-11/20/19 for large volume hemoptysis/ tranexamic acid inhalation Rx, acute respiratory failure/ intubation, abnormal CT chest, leukocytosis, mediastinal adenopathy Bronchoscopy- clot in LUL bronchus Needs repeat CT chest in 6 weeks= mid March. ASA was prescribed as 81 mg after stents years ago, but only taken occasionally.  . Taking only Protonix     Does not get flu vax. Medical hx CAD/ stents, tobacco abuse, hyperlipidemia, GERD,  Outpatient ENT/ Dr Benjamine Mola scoped Neg. Endoscopy- Neg  Hx childhood frequent epistaxis and cauterization. No hx of prior hemoptysis.  Wife says he had developed a harsh, deep cough in recent years. He has not smoked since 2/5 and both he and wife say he has almost completely stopped coughing. Did cough up some clots of old blood in first days after he got home. No wheeze, fever, adenopathy or other bleeding.  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  Rare ETOH without known liver disease. CT chest 11/17/19-  IMPRESSION: 1. There is dense consolidation of the lingula, and the lingular bronchus appears obstructed near the origin (series 7, image 70). This may be by clot, as reported on bronchoscopy. 2. There is some lucency  within the lingular consolidation concerning for a developing cavitation (series 7, image 78). 3. Small bilateral pleural effusions and dependent bibasilar atelectasis or consolidation. 4. Enlarged pretracheal lymph nodes measuring up to 2.4 x 1.3 cm, nonspecific and perhaps reactive. 5.  Emphysema (ICD10-J43.9). 6. Small, brightly hyperenhancing foci in the left lobe of the liver and right lobe of the liver. These are nonspecific and could represent flash filling hemangiomas or transient perfusion artifacts. Consider multiphasic contrast enhanced MRI to further evaluate on a nonacute basis when clinically appropriate. 7. Coronary artery disease.  02/12/20- Virtual Visit via Telephone Note  I connected with Matthew [name] on 02/12/20 at  3:15 PM EDT by telephone and verified that I am speaking with the correct person using two identifiers.  Location: Patient: O Provider: H   I discussed the limitations, risks, security and privacy concerns of performing an evaluation and management service by telephone and the availability of in person appointments. I also discussed with the patient that there may be a patient responsible charge related to this service. The patient expressed understanding and agreed to proceed.   History of Present Illness: 16 yom former smoker ( 78 pack years) for hosp f/u after w/u for / Epistaxis hemoptysis, complicated by hx epistaxis, CAD/ stents, tobacco abuse, hyperlipidemia, GERD,   Feeling much better now with only mild minor cough and no more bleeding.  Quit smoking Feb 5 !!- congratulated.  Observations/Objective: CXR 12/04/19- IMPRESSION: Nearly resolved bilateral heterogeneous airspace opacities. Previously seen bilateral heterogeneous airspace opacities are nearly resolved, in keeping with resolution of multifocal infection. No new airspace opacity.  PFT 02/09/20-  wnl !  Assessment and  Plan: Hemoptysis- resolved Tobacco abuse-  ended   Follow Up Instructions: Return PRN   I discussed the assessment and treatment plan with the patient. The patient was provided an opportunity to ask questions and all were answered. The patient agreed with the plan and demonstrated an understanding of the instructions.   The patient was advised to call back or seek an in-person evaluation if the symptoms worsen or if the condition fails to improve as anticipated.  I provided 16 minutes of non-face-to-face time during this encounter.   Baird Lyons, MD      ROS-see HPI   + = positive Constitutional:    weight loss, night sweats, fevers, chills, fatigue, lassitude. HEENT:    headaches, difficulty swallowing, tooth/dental problems, sore throat,       sneezing, itching, ear ache, nasal congestion, post nasal drip, snoring CV:    chest pain, orthopnea, PND, swelling in lower extremities, anasarca,                                   dizziness, palpitations Resp:   shortness of breath with exertion or at rest.                +productive cough,   non-productive cough, coughing up of blood.             change in color of mucus.  wheezing.   Skin:    rash or lesions. GI:  No-   heartburn, indigestion, abdominal pain, nausea, vomiting, diarrhea,                 change in bowel habits, loss of appetite GU: dysuria, change in color of urine, no urgency or frequency.   flank pain. MS:   joint pain, stiffness, decreased range of motion, back pain. Neuro-     nothing unusual Psych:  change in mood or affect.  depression or anxiety.   memory loss.  OBJ- Physical Exam General- Alert, Oriented, Affect-appropriate, Distress- none acute, + overweight Skin- rash-none, lesions- none, excoriation- none Lymphadenopathy- none Head- atraumatic            Eyes- Gross vision intact, PERRLA, conjunctivae and secretions clear            Ears- Hearing, canals-normal            Nose- Clear, no-Septal dev, mucus, polyps, erosion, perforation              Throat- Mallampati II , mucosa clear , drainage- none, tonsils- atrophic Neck- flexible , trachea midline, no stridor , thyroid nl, carotid no bruit Chest - symmetrical excursion , unlabored           Heart/CV- RRR , no murmur , no gallop  , no rub, nl s1 s2                           - JVD +1 cm , edema- none, stasis changes- none, varices- none           Lung- clear to P&A, wheeze- none, cough- none , dullness-none, rub- none           Chest wall-  Abd-  Br/ Gen/ Rectal- Not done, not indicated Extrem- cyanosis- none, clubbing, none, atrophy- none, strength- nl Neuro- grossly intact to observation

## 2020-03-19 ENCOUNTER — Encounter: Payer: Self-pay | Admitting: Internal Medicine

## 2020-03-19 NOTE — Patient Instructions (Signed)
Return if needed

## 2020-03-19 NOTE — Assessment & Plan Note (Signed)
Key issue- why did he have massive hemoptysis.  Need to watch for recurrence, recognizing potential for AVM or similar underlying risk.

## 2020-03-19 NOTE — Assessment & Plan Note (Signed)
Very strongly encouraged to stick with this key life style change.

## 2020-03-19 NOTE — Assessment & Plan Note (Signed)
Despite his smoking history, PFT normal and as he cleared pulmonary blood, oxygenation has normalized.

## 2020-08-24 ENCOUNTER — Ambulatory Visit
Admission: EM | Admit: 2020-08-24 | Discharge: 2020-08-24 | Disposition: A | Discharge status: Home / Self Care | Payer: No Typology Code available for payment source | Attending: Emergency Medicine | Admitting: Emergency Medicine

## 2020-08-24 ENCOUNTER — Other Ambulatory Visit: Payer: Self-pay

## 2020-08-24 ENCOUNTER — Encounter: Payer: Self-pay | Admitting: Emergency Medicine

## 2020-08-24 DIAGNOSIS — H9201 Otalgia, right ear: Secondary | ICD-10-CM

## 2020-08-24 HISTORY — DX: Respiratory failure, unspecified, unspecified whether with hypoxia or hypercapnia: J96.90

## 2020-08-24 MED ORDER — CETIRIZINE HCL 10 MG PO TABS: 10.0000 mg | ORAL_TABLET | Freq: Every day | ORAL | 0 refills | Status: DC

## 2020-08-24 MED ORDER — PREDNISONE 20 MG PO TABS: 20.0000 mg | ORAL_TABLET | Freq: Every day | ORAL | 0 refills | Status: DC

## 2020-08-24 MED ORDER — FLUTICASONE PROPIONATE 50 MCG/ACT NA SUSP: 1.0000 | Freq: Every day | NASAL | 0 refills | Status: DC

## 2020-08-24 NOTE — Discharge Instructions (Addendum)
Zyrtec daily. Flonase 2 sprays daily. Drink lots of water throughout the day.

## 2020-08-24 NOTE — ED Triage Notes (Signed)
Pt here for right ear fullness x 10 days

## 2020-08-24 NOTE — ED Provider Notes (Signed)
[facility]    CSN: [id number] Arrival date & time: 08/24/20  0805      History   Chief Complaint Chief Complaint  Patient presents with  . Ear Fullness    HPI Matthew [name] is a 54 y.o. male  Presenting for right ear pain for the last 10 days.  States is intermittent, popping/fullness sensation.  No sinus pressure, though does admit to allergies and [place].  Has not taken thing for this.  No tinnitus, dizziness, change in hearing.  Past Medical History:  Diagnosis Date  . Coronary artery disease per pt last cardiology visit 2011 (found documentation 12-05-2009 w/ dr Leonia Reeves with [facility] in epic)/  currently pt is followed by pcp   12-28-2008  PCI and DES x2 to proximal and mid RCA/ last cardiac cath 12-09-2009  widely patent stents  . GERD (gastroesophageal reflux disease)   . Hyperlipidemia   . Olecranon bursitis of left elbow   . Respiratory failure ([place])   . S/P right coronary artery (RCA) stent placement 12/28/2008   PCI and DES x2-- proximal and mid RCA  . Wears dentures    UPPER    Patient Active Problem List   Diagnosis Date Noted  . Tobacco abuse 12/04/2019  . Respiratory failure ([place])   . Hemoptysis 11/17/2019    Past Surgical History:  Procedure Laterality Date  . CARDIAC CATHETERIZATION  12-09-2009  dr Ilda Foil   abnormal stress test (12-05-2009) widely patent coronaries present w/ evidence of diffuse atherosclerotic disease/  widely patent stented segments in the proximal and mid segment RCA/  LVEF 65%  . CORONARY ANGIOPLASTY WITH STENT PLACEMENT  12-28-2008  dr berry   PCI and stenting of the proxmial and mid RCA using cutting balloon atherectomy (x2 DES, Xience)/  LVEF >60% w/ normal wall motion  . ESOPHAGOGASTRODUODENOSCOPY N/A 11/17/2019   Procedure: ESOPHAGOGASTRODUODENOSCOPY (EGD);  Surgeon: Carol Ada, MD;  Location: [facility];  Service: Gastroenterology;  Laterality: N/A;  . KNEE  ARTHROSCOPY W/ MENISCECTOMY Right 2002  . NEUROPLASTY / TRANSPOSITION ULNAR NERVE AT ELBOW Right 10/2014   dr Caralyn Guile  . OLECRANON BURSECTOMY Left 05/06/2017   Procedure: LEFT ELBOW OLECRANON BURSA EXCISION;  Surgeon: Iran Planas, MD;  Location: [facility];  Service: Orthopedics;  Laterality: Left;  . ORIF RIGHT ANKLE FX  2006  . TONSILLECTOMY  age 61  . TRANSTHORACIC ECHOCARDIOGRAM  03/30/2016   mild concentric LVH, ef 50-55%/  trivial TR       Home Medications    Prior to Admission medications   Medication Sig Start Date End Date Taking? Authorizing Provider  cetirizine (ZYRTEC ALLERGY) 10 MG tablet Take 1 tablet (10 mg total) by mouth daily. 08/24/20   Hall-Potvin, [place], PA-C  fluticasone (FLONASE) 50 MCG/ACT nasal spray Place 1 spray into both nostrils daily. 08/24/20   Hall-Potvin, [place], PA-C  predniSONE (DELTASONE) 20 MG tablet Take 1 tablet (20 mg total) by mouth daily. 08/24/20   Hall-Potvin, [place], PA-C    Family History Family History  Problem Relation Age of Onset  . Cancer Mother   . Cancer Father     Social History Social History   Tobacco Use  . Smoking status: Former Smoker    Packs/day: 2.00    Years: 39.00    Pack years: 78.00    Types: Cigarettes    Quit date: 11/17/2019    Years since quitting: 0.7  . Smokeless tobacco: Never Used  .  Tobacco comment: since age 53  Vaping Use  . Vaping Use: Never used  Substance Use Topics  . Alcohol use: Yes    Comment: seldom  . Drug use: No     Allergies   Bee venom, Codeine, and Onion   Review of Systems Review of Systems  Constitutional: Negative for fatigue and fever.  HENT: Positive for ear pain. Negative for congestion, dental problem, ear discharge, facial swelling, hearing loss, sinus pain, sore throat, trouble swallowing and voice change.   Eyes: Negative for photophobia, pain and visual disturbance.  Respiratory: Negative for cough and shortness of breath.     Cardiovascular: Negative for chest pain and palpitations.  Gastrointestinal: Negative for diarrhea and vomiting.  Musculoskeletal: Negative for arthralgias and myalgias.  Neurological: Negative for dizziness and headaches.     Physical Exam Triage Vital Signs ED Triage Vitals  Enc Vitals Group     BP      Pulse      Resp      Temp      Temp src      SpO2      Weight      Height      Head Circumference      Peak Flow      Pain Score      Pain Loc      Pain Edu?      Excl. in [place]?    No data found.  Updated Vital Signs BP (!) 141/93 (BP Location: Left Arm)   Pulse 79   Temp (!) 97.5 F (36.4 C) (Oral)   Resp 18   SpO2 97%   Visual Acuity Right Eye Distance:   Left Eye Distance:   Bilateral Distance:    Right Eye Near:   Left Eye Near:    Bilateral Near:     Physical Exam Constitutional:      General: He is not in acute distress.    Appearance: He is obese. He is not ill-appearing.  HENT:     Head: Normocephalic and atraumatic.     Right Ear: Ear canal and external ear normal.     Left Ear: Tympanic membrane, ear canal and external ear normal.     Ears:     Comments: Right TM with mild retraction.  Bony landmarks intact    Nose: Nose normal.     Mouth/Throat:     Mouth: Mucous membranes are moist.     Pharynx: Oropharynx is clear.  Eyes:     General: No scleral icterus.    Conjunctiva/sclera: Conjunctivae normal.     Pupils: Pupils are equal, round, and reactive to light.  Cardiovascular:     Rate and Rhythm: Normal rate and regular rhythm.  Pulmonary:     Effort: Pulmonary effort is normal. No respiratory distress.     Breath sounds: No wheezing.  Musculoskeletal:     Cervical back: No tenderness.  Lymphadenopathy:     Cervical: No cervical adenopathy.  Skin:    Coloration: Skin is not jaundiced or pale.  Neurological:     Mental Status: He is alert and oriented to person, place, and time.      UC Treatments / Results  Labs (all labs  ordered are listed, but only abnormal results are displayed) Labs Reviewed - No data to display  EKG   Radiology No results found.  Procedures Procedures (including critical care time)  Medications Ordered in UC Medications - No data to display  Initial  Impression / Assessment and Plan / UC Course  I have reviewed the triage vital signs and the nursing notes.  Pertinent labs & imaging results that were available during my care of the patient were reviewed by me and considered in my medical decision making (see chart for details).     Afebrile, nontoxic.  Suspect eustachian tube dysfunction second to seasonal allergies VS weather change as a result of travel.  Treat supportively as below.  Pt has f/u with ENT Monday, will keep appt.  Return precautions discussed, pt verbalized understanding and is agreeable to plan. Final Clinical Impressions(s) / UC Diagnoses   Final diagnoses:  Otalgia, right     Discharge Instructions     Zyrtec daily. Flonase 2 sprays daily. Drink lots of water throughout the day.    ED Prescriptions    Medication Sig Dispense Auth. Provider   cetirizine (ZYRTEC ALLERGY) 10 MG tablet Take 1 tablet (10 mg total) by mouth daily. 30 tablet Hall-Potvin, [place], PA-C   fluticasone (FLONASE) 50 MCG/ACT nasal spray Place 1 spray into both nostrils daily. 16 g Hall-Potvin, [place], PA-C   predniSONE (DELTASONE) 20 MG tablet Take 1 tablet (20 mg total) by mouth daily. 5 tablet Hall-Potvin, [place], PA-C     PDMP not reviewed this encounter.   Neldon Mc [place], [place] 08/24/20 [phone]

## 2020-09-23 ENCOUNTER — Ambulatory Visit
Admission: EM | Admit: 2020-09-23 | Discharge: 2020-09-23 | Disposition: A | Discharge status: Home / Self Care | Payer: No Typology Code available for payment source | Attending: Emergency Medicine | Admitting: Emergency Medicine

## 2020-09-23 ENCOUNTER — Other Ambulatory Visit: Payer: Self-pay

## 2020-09-23 DIAGNOSIS — K529 Noninfective gastroenteritis and colitis, unspecified: Secondary | ICD-10-CM

## 2020-09-23 MED ORDER — ONDANSETRON 4 MG PO TBDP: 4.0000 mg | ORAL_TABLET | Freq: Three times a day (TID) | ORAL | 0 refills | Status: DC | PRN

## 2020-09-23 NOTE — ED Triage Notes (Signed)
Patient states he had diarrhea Thursday and Friday and emesis episodes x 3. Pt is aox4 and Ambulatory.

## 2020-09-23 NOTE — ED Provider Notes (Signed)
[facility]    CSN: [id number] Arrival date & time: 09/23/20  1028      History   Chief Complaint Chief Complaint  Patient presents with  . Diarrhea    Thursday and Friday    HPI Matthew [name] is a 54 y.o. male  Matthew [name] is a 54 y.o. male who presents for evaluation of diarrhea. Onset of diarrhea was 3 days ago. Diarrhea is occurring approximately a few times per day. Patient describes diarrhea as loose. Diarrhea has been associated with nausea and vomiting occurring a few times; none since Saturday.  Patient denies blood in stool, recent antibiotic use, recent camping, recent travel, significant abdominal pain. Evaluation to date: none. Treatment to date: 36 light.  Has had multiple coworkers, family members, friends with same symptoms.  No known Covid contacts.  Tolerating PO intake. The following portions of the patient's history were reviewed and updated as appropriate: allergies, current medications, past family history, past medical history, past social history, past surgical history and problem list.     Past Medical History:  Diagnosis Date  . Coronary artery disease per pt last cardiology visit 2011 (found documentation 12-05-2009 w/ dr Leonia Reeves with [facility] in epic)/  currently pt is followed by pcp   12-28-2008  PCI and DES x2 to proximal and mid RCA/ last cardiac cath 12-09-2009  widely patent stents  . GERD (gastroesophageal reflux disease)   . Hyperlipidemia   . Olecranon bursitis of left elbow   . Respiratory failure ([place])   . S/P right coronary artery (RCA) stent placement 12/28/2008   PCI and DES x2-- proximal and mid RCA  . Wears dentures    UPPER    Patient Active Problem List   Diagnosis Date Noted  . Tobacco abuse 12/04/2019  . Respiratory failure ([place])   . Hemoptysis 11/17/2019    Past Surgical History:  Procedure Laterality Date  . CARDIAC CATHETERIZATION  12-09-2009  dr Ilda Foil   abnormal stress test (12-05-2009)  widely patent coronaries present w/ evidence of diffuse atherosclerotic disease/  widely patent stented segments in the proximal and mid segment RCA/  LVEF 65%  . CORONARY ANGIOPLASTY WITH STENT PLACEMENT  12-28-2008  dr berry   PCI and stenting of the proxmial and mid RCA using cutting balloon atherectomy (x2 DES, Xience)/  LVEF >60% w/ normal wall motion  . ESOPHAGOGASTRODUODENOSCOPY N/A 11/17/2019   Procedure: ESOPHAGOGASTRODUODENOSCOPY (EGD);  Surgeon: Carol Ada, MD;  Location: [facility];  Service: Gastroenterology;  Laterality: N/A;  . KNEE ARTHROSCOPY W/ MENISCECTOMY Right 2002  . NEUROPLASTY / TRANSPOSITION ULNAR NERVE AT ELBOW Right 10/2014   dr Caralyn Guile  . OLECRANON BURSECTOMY Left 05/06/2017   Procedure: LEFT ELBOW OLECRANON BURSA EXCISION;  Surgeon: Iran Planas, MD;  Location: [facility];  Service: Orthopedics;  Laterality: Left;  . ORIF RIGHT ANKLE FX  2006  . TONSILLECTOMY  age 54  . TRANSTHORACIC ECHOCARDIOGRAM  03/30/2016   mild concentric LVH, ef 50-55%/  trivial TR       Home Medications    Prior to Admission medications   Medication Sig Start Date End Date Taking? Authorizing Provider  cetirizine (ZYRTEC ALLERGY) 10 MG tablet Take 1 tablet (10 mg total) by mouth daily. 08/24/20  Yes Hall-Potvin, [place], PA-C  fluticasone (FLONASE) 50 MCG/ACT nasal spray Place 1 spray into both nostrils daily. 08/24/20  Yes Hall-Potvin, [place], PA-C  predniSONE (DELTASONE) 20 MG tablet Take 1 tablet (20 mg total) by mouth daily. 08/24/20  Yes Hall-Potvin, [place], PA-C  ondansetron (ZOFRAN ODT) 4 MG disintegrating tablet Take 1 tablet (4 mg total) by mouth every 8 (eight) hours as needed for nausea or vomiting. 09/23/20   Hall-Potvin, [place], PA-C    Family History Family History  Problem Relation Age of Onset  . Cancer Mother   . Cancer Father     Social History Social History   Tobacco Use  . Smoking status: Former Smoker    Packs/day: 2.00     Years: 39.00    Pack years: 78.00    Types: Cigarettes    Quit date: 11/17/2019    Years since quitting: 0.8  . Smokeless tobacco: Never Used  . Tobacco comment: since age 22  Vaping Use  . Vaping Use: Never used  Substance Use Topics  . Alcohol use: Yes    Comment: seldom  . Drug use: No     Allergies   Bee venom, Codeine, and Onion   Review of Systems Review of Systems  Constitutional: Negative for fatigue and fever.  Respiratory: Negative for cough and shortness of breath.   Cardiovascular: Negative for chest pain and palpitations.  Gastrointestinal: Positive for diarrhea, nausea and vomiting. Negative for abdominal pain, blood in stool and rectal pain.  Musculoskeletal: Negative for arthralgias and myalgias.  Skin: Negative for rash and wound.  Neurological: Negative for speech difficulty and headaches.  All other systems reviewed and are negative.    Physical Exam Triage Vital Signs ED Triage Vitals  Enc Vitals Group     BP 09/23/20 1140 100/70     Pulse Rate 09/23/20 1140 (!) 111     Resp 09/23/20 1140 20     Temp 09/23/20 1140 100.3 F (37.9 C)     Temp Source 09/23/20 1140 Oral     SpO2 09/23/20 1140 96 %     Weight --      Height --      Head Circumference --      Peak Flow --      Pain Score 09/23/20 1142 2     Pain Loc --      Pain Edu? --      Excl. in [place]? --    No data found.  Updated Vital Signs BP 100/70 (BP Location: Left Arm)   Pulse (!) 111   Temp 100.3 F (37.9 C) (Oral)   Resp 20   SpO2 96%   Visual Acuity Right Eye Distance:   Left Eye Distance:   Bilateral Distance:    Right Eye Near:   Left Eye Near:    Bilateral Near:     Physical Exam Constitutional:      General: He is not in acute distress.    Appearance: He is obese. He is not ill-appearing.  HENT:     Head: Normocephalic and atraumatic.  Eyes:     General: No scleral icterus.    Pupils: Pupils are equal, round, and reactive to light.  Cardiovascular:     Rate  and Rhythm: Regular rhythm. Tachycardia present.     Heart sounds: Normal heart sounds.  Pulmonary:     Effort: Pulmonary effort is normal. No respiratory distress.     Breath sounds: No wheezing or rales.  Skin:    Capillary Refill: Capillary refill takes less than 2 seconds.     Coloration: Skin is not jaundiced or pale.  Neurological:     Mental Status: He is alert and oriented to person, place, and time.  UC Treatments / Results  Labs (all labs ordered are listed, but only abnormal results are displayed) Labs Reviewed - No data to display  EKG   Radiology No results found.  Procedures Procedures (including critical care time)  Medications Ordered in UC Medications - No data to display  Initial Impression / Assessment and Plan / UC Course  I have reviewed the triage vital signs and the nursing notes.  Pertinent labs & imaging results that were available during my care of the patient were reviewed by me and considered in my medical decision making (see chart for details).     Febrile, nontoxic in office today.  Tolerating food and fluids well.  Appears hydrated today, though is hypotensive and tachycardic from baseline.  EKG done in office, reviewed by me and compared to previous from 05/06/17: Sinus tachycardia with ventricular rate 115 bpm.  No QTC prolongation, no ST elevation/depression, or change in waveforms - Nonacute.  We will continue pushing fluids, treat supportively.  Declines Covid testing.  Requesting work note: Provided.  Return precautions discussed, pt verbalized understanding and is agreeable to plan. Final Clinical Impressions(s) / UC Diagnoses   Final diagnoses:  Gastroenteritis   Discharge Instructions   None    ED Prescriptions    Medication Sig Dispense Auth. Provider   ondansetron (ZOFRAN ODT) 4 MG disintegrating tablet Take 1 tablet (4 mg total) by mouth every 8 (eight) hours as needed for nausea or vomiting. 21 tablet Hall-Potvin,  [place], PA-C     PDMP not reviewed this encounter.   Hall-Potvin, [place], [place] 09/23/20 1245

## 2020-09-26 ENCOUNTER — Emergency Department (HOSPITAL_COMMUNITY): Payer: HRSA Program

## 2020-09-26 ENCOUNTER — Inpatient Hospital Stay (HOSPITAL_COMMUNITY)
Admission: EM | Admit: 2020-09-26 | Discharge: 2020-11-16 | DRG: 177 | Disposition: A | Discharge status: Home Health | Payer: HRSA Program | Attending: Internal Medicine | Admitting: Internal Medicine

## 2020-09-26 ENCOUNTER — Inpatient Hospital Stay (HOSPITAL_COMMUNITY): Payer: HRSA Program

## 2020-09-26 DIAGNOSIS — J1282 Pneumonia due to coronavirus disease 2019: Secondary | ICD-10-CM | POA: Diagnosis present

## 2020-09-26 DIAGNOSIS — D696 Thrombocytopenia, unspecified: Secondary | ICD-10-CM | POA: Diagnosis present

## 2020-09-26 DIAGNOSIS — R0602 Shortness of breath: Secondary | ICD-10-CM

## 2020-09-26 DIAGNOSIS — Z955 Presence of coronary angioplasty implant and graft: Secondary | ICD-10-CM

## 2020-09-26 DIAGNOSIS — I517 Cardiomegaly: Secondary | ICD-10-CM | POA: Diagnosis present

## 2020-09-26 DIAGNOSIS — Z91018 Allergy to other foods: Secondary | ICD-10-CM | POA: Diagnosis not present

## 2020-09-26 DIAGNOSIS — R042 Hemoptysis: Secondary | ICD-10-CM | POA: Diagnosis not present

## 2020-09-26 DIAGNOSIS — J9811 Atelectasis: Secondary | ICD-10-CM | POA: Diagnosis present

## 2020-09-26 DIAGNOSIS — Z23 Encounter for immunization: Secondary | ICD-10-CM | POA: Diagnosis not present

## 2020-09-26 DIAGNOSIS — E785 Hyperlipidemia, unspecified: Secondary | ICD-10-CM | POA: Diagnosis present

## 2020-09-26 DIAGNOSIS — U071 COVID-19: Principal | ICD-10-CM

## 2020-09-26 DIAGNOSIS — J80 Acute respiratory distress syndrome: Secondary | ICD-10-CM | POA: Diagnosis present

## 2020-09-26 DIAGNOSIS — R630 Anorexia: Secondary | ICD-10-CM | POA: Diagnosis present

## 2020-09-26 DIAGNOSIS — Z91030 Bee allergy status: Secondary | ICD-10-CM

## 2020-09-26 DIAGNOSIS — Z9119 Patient's noncompliance with other medical treatment and regimen: Secondary | ICD-10-CM

## 2020-09-26 DIAGNOSIS — I959 Hypotension, unspecified: Secondary | ICD-10-CM | POA: Diagnosis present

## 2020-09-26 DIAGNOSIS — I5033 Acute on chronic diastolic (congestive) heart failure: Secondary | ICD-10-CM | POA: Diagnosis present

## 2020-09-26 DIAGNOSIS — Z885 Allergy status to narcotic agent status: Secondary | ICD-10-CM | POA: Diagnosis not present

## 2020-09-26 DIAGNOSIS — F319 Bipolar disorder, unspecified: Secondary | ICD-10-CM | POA: Diagnosis present

## 2020-09-26 DIAGNOSIS — Z6826 Body mass index (BMI) 26.0-26.9, adult: Secondary | ICD-10-CM

## 2020-09-26 DIAGNOSIS — Z7952 Long term (current) use of systemic steroids: Secondary | ICD-10-CM | POA: Diagnosis not present

## 2020-09-26 DIAGNOSIS — F410 Panic disorder [episodic paroxysmal anxiety] without agoraphobia: Secondary | ICD-10-CM | POA: Diagnosis present

## 2020-09-26 DIAGNOSIS — J810 Acute pulmonary edema: Secondary | ICD-10-CM

## 2020-09-26 DIAGNOSIS — K219 Gastro-esophageal reflux disease without esophagitis: Secondary | ICD-10-CM | POA: Diagnosis present

## 2020-09-26 DIAGNOSIS — I2510 Atherosclerotic heart disease of native coronary artery without angina pectoris: Secondary | ICD-10-CM

## 2020-09-26 DIAGNOSIS — J9601 Acute respiratory failure with hypoxia: Secondary | ICD-10-CM | POA: Diagnosis present

## 2020-09-26 DIAGNOSIS — Z87891 Personal history of nicotine dependence: Secondary | ICD-10-CM

## 2020-09-26 DIAGNOSIS — Z79899 Other long term (current) drug therapy: Secondary | ICD-10-CM | POA: Diagnosis not present

## 2020-09-26 DIAGNOSIS — R0600 Dyspnea, unspecified: Secondary | ICD-10-CM

## 2020-09-26 LAB — CBC WITH DIFFERENTIAL/PLATELET
Abs Immature Granulocytes: 0 10*3/uL (ref 0.00–0.07)
Basophils Absolute: 0 10*3/uL (ref 0.0–0.1)
Basophils Relative: 0 %
Eosinophils Absolute: 0 10*3/uL (ref 0.0–0.5)
Eosinophils Relative: 0 %
HCT: 46.4 % (ref 39.0–52.0)
Hemoglobin: 15.5 g/dL (ref 13.0–17.0)
Lymphocytes Relative: 8 %
Lymphs Abs: 0.5 10*3/uL — ABNORMAL LOW (ref 0.7–4.0)
MCH: 30.3 pg (ref 26.0–34.0)
MCHC: 33.4 g/dL (ref 30.0–36.0)
MCV: 90.6 fL (ref 80.0–100.0)
Monocytes Absolute: 0.2 10*3/uL (ref 0.1–1.0)
Monocytes Relative: 3 %
Neutro Abs: 5.6 10*3/uL (ref 1.7–7.7)
Neutrophils Relative %: 89 %
Platelets: 88 10*3/uL — ABNORMAL LOW (ref 150–400)
RBC: 5.12 MIL/uL (ref 4.22–5.81)
RDW: 13.5 % (ref 11.5–15.5)
WBC: 6.3 10*3/uL (ref 4.0–10.5)
nRBC: 0 % (ref 0.0–0.2)

## 2020-09-26 LAB — COMPREHENSIVE METABOLIC PANEL
ALT: 64 U/L — ABNORMAL HIGH (ref 0–44)
AST: 103 U/L — ABNORMAL HIGH (ref 15–41)
Albumin: 2.9 g/dL — ABNORMAL LOW (ref 3.5–5.0)
Alkaline Phosphatase: 85 U/L (ref 38–126)
Anion gap: 12 (ref 5–15)
BUN: 25 mg/dL — ABNORMAL HIGH (ref 6–20)
CO2: 24 mmol/L (ref 22–32)
Calcium: 7.7 mg/dL — ABNORMAL LOW (ref 8.9–10.3)
Chloride: 98 mmol/L (ref 98–111)
Creatinine, Ser: 1.31 mg/dL — ABNORMAL HIGH (ref 0.61–1.24)
GFR, Estimated: >60 mL/min (ref >60)
Glucose, Bld: 121 mg/dL — ABNORMAL HIGH (ref 70–99)
Potassium: 4 mmol/L (ref 3.5–5.1)
Sodium: 134 mmol/L — ABNORMAL LOW (ref 135–145)
Total Bilirubin: 0.9 mg/dL (ref 0.3–1.2)
Total Protein: 6.6 g/dL (ref 6.5–8.1)

## 2020-09-26 LAB — I-STAT ARTERIAL BLOOD GAS, ED
Acid-Base Excess: 0 mmol/L (ref 0.0–2.0)
Bicarbonate: 22.8 mmol/L (ref 20.0–28.0)
Calcium, Ion: 1.04 mmol/L — ABNORMAL LOW (ref 1.15–1.40)
HCT: 46 % (ref 39.0–52.0)
Hemoglobin: 15.6 g/dL (ref 13.0–17.0)
O2 Saturation: 98 %
Patient temperature: 98.2
Potassium: 3.7 mmol/L (ref 3.5–5.1)
Sodium: 135 mmol/L (ref 135–145)
TCO2: 24 mmol/L (ref 22–32)
pCO2 arterial: 31.7 mmHg — ABNORMAL LOW (ref 32.0–48.0)
pH, Arterial: 7.464 — ABNORMAL HIGH (ref 7.350–7.450)
pO2, Arterial: 90 mmHg (ref 83.0–108.0)

## 2020-09-26 LAB — RESP PANEL BY RT-PCR (FLU A&B, COVID) ARPGX2
Influenza A by PCR: NEGATIVE
Influenza B by PCR: NEGATIVE
SARS Coronavirus 2 by RT PCR: POSITIVE — AB

## 2020-09-26 LAB — TROPONIN I (HIGH SENSITIVITY)
Troponin I (High Sensitivity): 8 ng/L (ref <18)
Troponin I (High Sensitivity): 9 ng/L (ref <18)

## 2020-09-26 LAB — BRAIN NATRIURETIC PEPTIDE: B Natriuretic Peptide: 23.4 pg/mL (ref 0.0–100.0)

## 2020-09-26 MED ORDER — SODIUM CHLORIDE 0.9 % IV SOLN
100.0000 mg | Freq: Every day | INTRAVENOUS | Status: AC
  Administered 2020-09-27 – 2020-09-30 (×4): 100 mg via INTRAVENOUS
  Filled 2020-09-26 (×5): qty 20

## 2020-09-26 MED ORDER — FUROSEMIDE 10 MG/ML IJ SOLN
60.0000 mg | Freq: Once | INTRAMUSCULAR | Status: AC
  Administered 2020-09-26: 19:00:00 60 mg via INTRAVENOUS
  Filled 2020-09-26: qty 6

## 2020-09-26 MED ORDER — ENOXAPARIN SODIUM 60 MG/0.6ML ~~LOC~~ SOLN
55.0000 mg | SUBCUTANEOUS | Status: DC
  Administered 2020-09-27: 01:00:00 55 mg via SUBCUTANEOUS
  Filled 2020-09-26 (×2): qty 0.55

## 2020-09-26 MED ORDER — SODIUM CHLORIDE 0.9 % IV SOLN
200.0000 mg | Freq: Once | INTRAVENOUS | Status: AC
  Administered 2020-09-27: 01:00:00 200 mg via INTRAVENOUS
  Filled 2020-09-26: qty 40

## 2020-09-26 MED ORDER — SODIUM CHLORIDE 0.9% FLUSH
3.0000 mL | Freq: Two times a day (BID) | INTRAVENOUS | Status: DC
  Administered 2020-09-27 – 2020-11-10 (×75): 3 mL via INTRAVENOUS

## 2020-09-26 MED ORDER — POLYETHYLENE GLYCOL 3350 17 G PO PACK: 17.0000 g | PACK | Freq: Every day | ORAL | Status: DC | PRN

## 2020-09-26 MED ORDER — METHYLPREDNISOLONE SODIUM SUCC 125 MG IJ SOLR: 125.0000 mg | Freq: Once | INTRAMUSCULAR | Status: DC

## 2020-09-26 MED ORDER — ACETAMINOPHEN 325 MG PO TABS
650.0000 mg | ORAL_TABLET | Freq: Four times a day (QID) | ORAL | Status: DC | PRN
  Administered 2020-11-04: 650 mg via ORAL
  Filled 2020-09-26: qty 2

## 2020-09-26 MED ORDER — DEXAMETHASONE SODIUM PHOSPHATE 10 MG/ML IJ SOLN
6.0000 mg | INTRAMUSCULAR | Status: DC
  Administered 2020-09-26: 23:00:00 6 mg via INTRAVENOUS
  Filled 2020-09-26: qty 1

## 2020-09-26 NOTE — ED Provider Notes (Signed)
[facility] EMERGENCY DEPARTMENT Provider Note   CSN: [id number] Arrival date & time: 09/26/20  1840     History SOB   Matthew [name] is a 54 y.o. male with history significant for CAD, hyperlipidemia who presents for evaluation of shortness of breath.  States he has had some intermittent shortness of breath over the last week however worse over the last 3 hours.  Was watching TV when he felt like his shortness of breath suddenly worsen.  He drove to a fire department and was noted to be hypoxic to 78% on room air.  States he felt generally bad last week and had "a stomach bug."  He was seen at urgent care.  States he had negative Covid test.  He is not vaccinated against Covid.  States a few years ago he had a pinched blood vessel to his left lung however other than that has not had any significant heart failure, PE.  Does have history of stents for CAD however states his chest tightness that he currently has does not feel similar to that.  States he does feel "winded."  Was put on 15 L via nonrebreather at 90%.  Given nitro by EMS for possible pulmonary edema.  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  He says he sleeps with one pillow.  He denies any PND orthopnea.  Denies lower extremity edema.  No fever, chills, nausea, vomiting, hemoptysis abdominal pain, diarrhea, dysuria.  GI symptoms improved 4 days ago.  He denies any melena or bright blood per rectum.  No recent surgery, malignancy.  Does drive trucks for living.  Denies additional aggravating or relieving factors.  He is not take anything for symptoms at home. No current CP.  History obtained from patient and past medical records.  No interpreter used  HPI     Past Medical History:  Diagnosis Date  . Coronary artery disease per pt last cardiology visit 2011 (found documentation 12-05-2009 w/ dr Leonia Reeves with [facility] in epic)/   currently pt is followed by pcp   12-28-2008  PCI and DES x2 to proximal and mid RCA/ last cardiac cath 12-09-2009  widely patent stents  . GERD (gastroesophageal reflux disease)   . Hyperlipidemia   . Olecranon bursitis of left elbow   . Respiratory failure ([place])   . S/P right coronary artery (RCA) stent placement 12/28/2008   PCI and DES x2-- proximal and mid RCA  . Wears dentures    UPPER    Patient Active Problem List   Diagnosis Date Noted  . Tobacco abuse 12/04/2019  . Respiratory failure ([place])   . Hemoptysis 11/17/2019    Past Surgical History:  Procedure Laterality Date  . CARDIAC CATHETERIZATION  12-09-2009  dr Ilda Foil   abnormal stress test (12-05-2009) widely patent coronaries present w/ evidence of diffuse atherosclerotic disease/  widely patent stented segments in the proximal and mid segment RCA/  LVEF 65%  . CORONARY ANGIOPLASTY WITH STENT PLACEMENT  12-28-2008  dr berry   PCI and stenting of the proxmial and mid RCA using cutting balloon atherectomy (x2 DES, Xience)/  LVEF >60% w/ normal wall motion  . ESOPHAGOGASTRODUODENOSCOPY N/A 11/17/2019   Procedure: ESOPHAGOGASTRODUODENOSCOPY (EGD);  Surgeon: Carol Ada, MD;  Location: [facility];  Service: Gastroenterology;  Laterality: N/A;  . KNEE ARTHROSCOPY W/ MENISCECTOMY Right 2002  . NEUROPLASTY / TRANSPOSITION ULNAR NERVE AT ELBOW Right  10/2014   dr Caralyn Guile  . OLECRANON BURSECTOMY Left 05/06/2017   Procedure: LEFT ELBOW OLECRANON BURSA EXCISION;  Surgeon: Iran Planas, MD;  Location: [facility];  Service: Orthopedics;  Laterality: Left;  . ORIF RIGHT ANKLE FX  2006  . TONSILLECTOMY  age 57  . TRANSTHORACIC ECHOCARDIOGRAM  03/30/2016   mild concentric LVH, ef 50-55%/  trivial TR       Family History  Problem Relation Age of Onset  . Cancer Mother   . Cancer Father     Social History   Tobacco Use  . Smoking status: Former Smoker    Packs/day: 2.00    Years: 39.00    Pack years: 78.00     Types: Cigarettes    Quit date: 11/17/2019    Years since quitting: 0.8  . Smokeless tobacco: Never Used  . Tobacco comment: since age 19  Vaping Use  . Vaping Use: Never used  Substance Use Topics  . Alcohol use: Yes    Comment: seldom  . Drug use: No    Home Medications Prior to Admission medications   Medication Sig Start Date End Date Taking? Authorizing Provider  cetirizine (ZYRTEC ALLERGY) 10 MG tablet Take 1 tablet (10 mg total) by mouth daily. 08/24/20   Hall-Potvin, [place], PA-C  fluticasone (FLONASE) 50 MCG/ACT nasal spray Place 1 spray into both nostrils daily. 08/24/20   Hall-Potvin, [place], PA-C  ondansetron (ZOFRAN ODT) 4 MG disintegrating tablet Take 1 tablet (4 mg total) by mouth every 8 (eight) hours as needed for nausea or vomiting. 09/23/20   Hall-Potvin, [place], PA-C  predniSONE (DELTASONE) 20 MG tablet Take 1 tablet (20 mg total) by mouth daily. 08/24/20   Hall-Potvin, [place], PA-C    Allergies    Bee venom, Codeine, and Onion  Review of Systems   Review of Systems  Constitutional: Negative.   HENT: Negative.   Respiratory: Positive for cough, chest tightness and shortness of breath.   Cardiovascular: Negative.   Gastrointestinal: Positive for abdominal pain (Last week, resolved), diarrhea (Resolved) and vomiting (Resolved last week). Negative for abdominal distention, anal bleeding, blood in stool, constipation and rectal pain.  Genitourinary: Negative.   Musculoskeletal: Negative.   Skin: Negative.   Neurological: Positive for weakness (Generalized). Negative for dizziness, tremors, seizures, syncope, facial asymmetry, speech difficulty, light-headedness, numbness and headaches.  All other systems reviewed and are negative.   Physical Exam Updated Vital Signs BP (!) 122/107   Pulse 91   Temp 98.2 F (36.8 C)   Resp (!) 33   Ht 5\' 11"  (1.803 m)   Wt 113.4 kg   SpO2 97%   BMI 34.87 kg/m   Physical Exam Vitals and nursing note  reviewed.  Constitutional:      General: He is not in acute distress.    Appearance: He is well-developed and well-nourished. He is obese. He is ill-appearing. He is not toxic-appearing or diaphoretic.  HENT:     Head: Normocephalic and atraumatic.     Nose: Nose normal.     Mouth/Throat:     Mouth: Mucous membranes are moist.  Eyes:     Pupils: Pupils are equal, round, and reactive to light.  Cardiovascular:     Rate and Rhythm: Normal rate and regular rhythm.     Pulses: Normal pulses.     Heart sounds: Normal heart sounds.  Pulmonary:     Effort: Tachypnea and accessory muscle usage present.     Breath sounds: Rhonchi and rales  present.     Comments: Crackles bilateral lower lung bases.  Rhonchi to left lung.  15 L on nonrebreather.  Full sentences without difficulty Chest:     Comments: Equal rise and fall to chest wall Abdominal:     General: Bowel sounds are normal. There is no distension.     Palpations: Abdomen is soft.     Tenderness: There is no abdominal tenderness. There is no right CVA tenderness, left CVA tenderness, guarding or rebound. Negative signs include Murphy's sign and McBurney's sign.     Hernia: No hernia is present.     Comments: Protuberant abdomen, soft, nontender  Musculoskeletal:        General: Normal range of motion.     Cervical back: Normal range of motion and neck supple.     Comments: Compartments soft.  No bony tenderness.  Moves all 4 extremities without difficulty.  Bevelyn Buckles' sign negative  Skin:    General: Skin is warm and dry.     Capillary Refill: Capillary refill takes less than 2 seconds.     Comments: No edema, erythema or warmth.  No fluctuance or induration  Neurological:     Mental Status: He is alert.     Comments: Cranial nerves II through grossly intact.  No facial droop.  Psychiatric:        Mood and Affect: Mood and affect normal.     ED Results / Procedures / Treatments   Labs (all labs ordered are listed, but only  abnormal results are displayed) Labs Reviewed  RESP PANEL BY RT-PCR (FLU A&B, COVID) ARPGX2 - Abnormal; Notable for the following components:      Result Value   SARS Coronavirus 2 by RT PCR POSITIVE (*)    All other components within normal limits  CBC WITH DIFFERENTIAL/PLATELET - Abnormal; Notable for the following components:   Platelets 88 (*)    Lymphs Abs 0.5 (*)    All other components within normal limits  COMPREHENSIVE METABOLIC PANEL - Abnormal; Notable for the following components:   Sodium 134 (*)    Glucose, Bld 121 (*)    BUN 25 (*)    Creatinine, Ser 1.31 (*)    Calcium 7.7 (*)    Albumin 2.9 (*)    AST 103 (*)    ALT 64 (*)    All other components within normal limits  I-STAT ARTERIAL BLOOD GAS, ED - Abnormal; Notable for the following components:   pH, Arterial 7.464 (*)    pCO2 arterial 31.7 (*)    Calcium, Ion 1.04 (*)    All other components within normal limits  CULTURE, BLOOD (ROUTINE X 2)  CULTURE, BLOOD (ROUTINE X 2)  BRAIN NATRIURETIC PEPTIDE  LACTIC ACID, PLASMA  LACTIC ACID, PLASMA  D-DIMER, QUANTITATIVE (NOT AT [facility])  PROCALCITONIN  LACTATE DEHYDROGENASE  FERRITIN  TRIGLYCERIDES  FIBRINOGEN  C-REACTIVE PROTEIN  TROPONIN I (HIGH SENSITIVITY)  TROPONIN I (HIGH SENSITIVITY)    EKG None  Radiology DG Chest Portable 1 View  Result Date: 09/26/2020 CLINICAL DATA:  Cough and shortness of breath. EXAM: PORTABLE CHEST 1 VIEW COMPARISON:  12/04/2019 FINDINGS: Cardiomegaly which is increased from prior exam. Pulmonary edema, moderate in degree. There are bilateral pleural effusions. No confluent consolidation. No pneumothorax. No acute osseous abnormalities are seen. IMPRESSION: CHF with cardiomegaly, pulmonary edema and bilateral pleural effusions. Electronically Signed   By: Keith Rake M.D.   On: 09/26/2020 19:14    Procedures .Critical Care Performed by: Shelby Dubin  A, PA-C Authorized by: Nettie Elm, PA-C   Critical care  provider statement:    Critical care time (minutes):  45   Critical care was necessary to treat or prevent imminent or life-threatening deterioration of the following conditions:  Respiratory failure and cardiac failure   Critical care was time spent personally by me on the following activities:  Discussions with consultants, evaluation of patient's response to treatment, examination of patient, ordering and performing treatments and interventions, ordering and review of laboratory studies, ordering and review of radiographic studies, pulse oximetry, re-evaluation of patient's condition, obtaining history from patient or surrogate and review of old charts   (including critical care time)  Medications Ordered in ED Medications  methylPREDNISolone sodium succinate (SOLU-MEDROL) 125 mg/2 mL injection 125 mg (has no administration in time range)  remdesivir 200 mg in sodium chloride 0.9% 250 mL IVPB (has no administration in time range)    Followed by  remdesivir 100 mg in sodium chloride 0.9 % 100 mL IVPB (has no administration in time range)  furosemide (LASIX) injection 60 mg (60 mg Intravenous Given 09/26/20 1928)    ED Course  I have reviewed the triage vital signs and the nursing notes.  Pertinent labs & imaging results that were available during my care of the patient were reviewed by me and considered in my medical decision making (see chart for details).   Patient presents for evaluation of shortness of breath.  He is afebrile, nonseptic, non-ill-appearing.  Started having shortness of breath 1 week ago however worsened 3 hours PTA.  Denies mild chest pain which resolved by nitro.  No exertional pleuritic chest pain.  No unilateral leg swelling, redness or warmth.  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  He has no hemoptysis.  No unilateral leg swelling, redness or warmth.  Low suspicion for ACS or PE at this time.  Plan on labs, imaging and reassess  Labs and  imaging personally reviewed and interpreted:  CBC without leukocytosis, Platelets 88, no acute bleeding on exam. Metabolic panel with sodium 134, glucose 121, BUN 25, creatinine 1.31, similar to prior. Trop 8 EKG without ischemia DG chest with cardiomegaly, pulmonary edema and pleural effusion COVID positive  Patient comfortable on 15 L via nonrebreather.  Respiratory will try to place patient on high flow nasal cannula as patient does not appear in distress at this time.  Patient reassessed.  Discussed Covid positive status.  Question if patient has cardiomyopathy as cause of his Covid which is causing pulmonary edema and cardiomegaly?  Will admit for acute hypoxic respiratory failure.  CONSULT with Teaching Medicine who will evaluate patient for admission.  RT has evaluated patient.  Will patient was able to be transitioned to 6 L via nasal cannula.  Patient seen eval by attending, Dr. Laverta Baltimore who agrees with treatment, plan and disposition.    MDM Rules/Calculators/A&P                          Matthew [name] was evaluated in Emergency Department on 09/26/2020 for the symptoms described in the history of present illness. He was evaluated in the context of the global COVID-19 pandemic, which necessitated consideration that the patient might be at risk for infection with the SARS-CoV-2 virus that causes COVID-19. Institutional protocols and algorithms that pertain to the evaluation of patients at risk for COVID-19 are in a state of rapid change based on information released  by regulatory bodies including the CDC and federal and state organizations. These policies and algorithms were followed during the patient's care in the ED. Final Clinical Impression(s) / ED Diagnoses Final diagnoses:  Acute pulmonary edema ([place])  COVID  Acute respiratory failure with hypoxia [facility])  Cardiomegaly    Rx / DC Orders ED Discharge Orders    None       Merrit Friesen A, PA-C 09/26/20 2221     Margette Fast, MD 10/01/20 [phone]

## 2020-09-26 NOTE — H&P (Signed)
History and Physical   Matthew [name] GBT:[id number] DOB: March 31, 1966 DOA: 09/26/2020  PCP: Patient, No Pcp Per   Patient coming from: Home  Chief Complaint: Shortness of breath  HPI: Matthew [name] is a 54 y.o. male with medical history significant of CAD status post stent x2, tobacco use, hyperlipidemia, GERD who presents with acutely worsening shortness of breath.  Patient has had gradual worsening SOB since 12/9 and GI illness from around 12/10-12/14. On 12/13 he was seen at urgent care for diarrhea nausea and vomiting.  He was little bit tachycardic there but otherwise stable.  He was treated with fluids and supportive care, he declined a Covid test at that time. As above SOB has been gradually worsening, but became significantly worse today. He states that in the 3 hours prior to calling for transport he became significantly more short of breath.  Shortness of breath became worse while he was sitting watching TV.  When EMS arrived he was saturating 78% on room air with significant rales.  He was started on 15 L nonrebreather with improvement to 90%.  He is unvaccinated for Covid. Also reports some fever over the weekend. He denies fever, chest pain, abdominal pain, constipation.  ED Course: Labs in the ED significant for respiratory rate in the 20s to low 30s and requiring 6 to 10 L to maintain saturations.  Lab work-up showed BMP with creatinine of 1.3 from a baseline of 1.05, LFTs with calcium of 7.7 which corrects considering albumin of 2.9, AST 103, ALT 64.  CBC showed platelets of 88 otherwise within normal limits.  Troponin negative initially with repeat pending.  BMP within normal limits.  Respiratory panel for flu and Covid was positive for Covid.  ABG showed pH of 7.46 with PCO2 of 31.  Pending labs: D-dimer, procalcitonin, LDH, CRP, ferritin, triglycerides, fibrinogen, lactic acid, blood cultures.  Chest x-ray showed cardiomegaly with some pulmonary edema and small effusions.   Patient received a dose of Lasix and was started on steroids and remdesivir.  Review of Systems: As per HPI otherwise all other systems reviewed and are negative.  Past Medical History:  Diagnosis Date  . Coronary artery disease per pt last cardiology visit 2011 (found documentation 12-05-2009 w/ dr Leonia Reeves with [facility] in epic)/  currently pt is followed by pcp   12-28-2008  PCI and DES x2 to proximal and mid RCA/ last cardiac cath 12-09-2009  widely patent stents  . GERD (gastroesophageal reflux disease)   . Hyperlipidemia   . Olecranon bursitis of left elbow   . Respiratory failure ([place])   . S/P right coronary artery (RCA) stent placement 12/28/2008   PCI and DES x2-- proximal and mid RCA  . Wears dentures    UPPER    Past Surgical History:  Procedure Laterality Date  . CARDIAC CATHETERIZATION  12-09-2009  dr Ilda Foil   abnormal stress test (12-05-2009) widely patent coronaries present w/ evidence of diffuse atherosclerotic disease/  widely patent stented segments in the proximal and mid segment RCA/  LVEF 65%  . CORONARY ANGIOPLASTY WITH STENT PLACEMENT  12-28-2008  dr berry   PCI and stenting of the proxmial and mid RCA using cutting balloon atherectomy (x2 DES, Xience)/  LVEF >60% w/ normal wall motion  . ESOPHAGOGASTRODUODENOSCOPY N/A 11/17/2019   Procedure: ESOPHAGOGASTRODUODENOSCOPY (EGD);  Surgeon: Carol Ada, MD;  Location: [facility];  Service: Gastroenterology;  Laterality: N/A;  . KNEE ARTHROSCOPY W/ MENISCECTOMY Right 2002  . NEUROPLASTY / TRANSPOSITION ULNAR NERVE AT ELBOW  Right 10/2014   dr Caralyn Guile  . OLECRANON BURSECTOMY Left 05/06/2017   Procedure: LEFT ELBOW OLECRANON BURSA EXCISION;  Surgeon: Iran Planas, MD;  Location: [facility];  Service: Orthopedics;  Laterality: Left;  . ORIF RIGHT ANKLE FX  2006  . TONSILLECTOMY  age 66  . TRANSTHORACIC ECHOCARDIOGRAM  03/30/2016   mild concentric LVH, ef 50-55%/  trivial TR    Social History  reports  that he quit smoking about 10 months ago. His smoking use included cigarettes. He has a 78.00 pack-year smoking history. He has never used smokeless tobacco. He reports current alcohol use. He reports that he does not use drugs.  Allergies  Allergen Reactions  . Bee Venom   . Codeine Nausea And Vomiting  . Onion Swelling    "thoart swells up"    Family History  Problem Relation Age of Onset  . Cancer Mother   . Cancer Father   Reviewed on admission  Prior to Admission medications   Medication Sig Start Date End Date Taking? Authorizing Provider  cetirizine (ZYRTEC ALLERGY) 10 MG tablet Take 1 tablet (10 mg total) by mouth daily. 08/24/20   Hall-Potvin, [place], PA-C  fluticasone (FLONASE) 50 MCG/ACT nasal spray Place 1 spray into both nostrils daily. 08/24/20   Hall-Potvin, [place], PA-C  ondansetron (ZOFRAN ODT) 4 MG disintegrating tablet Take 1 tablet (4 mg total) by mouth every 8 (eight) hours as needed for nausea or vomiting. 09/23/20   Hall-Potvin, [place], PA-C  predniSONE (DELTASONE) 20 MG tablet Take 1 tablet (20 mg total) by mouth daily. 08/24/20   Hall-Potvin, [place], [place]    Physical Exam: Vitals:   09/26/20 1945 09/26/20 2000 09/26/20 2045 09/26/20 2209  BP: 125/79 131/72 (!) 122/107   Pulse: 87 89 91   Resp: (!) 33 (!) 33 (!) 33   Temp:      SpO2: 94% 94% 100% 97%  Weight:      Height:       Physical Exam Constitutional:      General: He is not in acute distress.    Appearance: Normal appearance.  HENT:     Head: Normocephalic and atraumatic.     Mouth/Throat:     Mouth: Mucous membranes are moist.     Pharynx: Oropharynx is clear.  Eyes:     Extraocular Movements: Extraocular movements intact.     Pupils: Pupils are equal, round, and reactive to light.  Cardiovascular:     Rate and Rhythm: Normal rate and regular rhythm.     Pulses: Normal pulses.     Heart sounds: Normal heart sounds.  Pulmonary:     Effort: No respiratory distress.     Breath  sounds: Rales present.     Comments: Tachypnea, Mild increased WOB Abdominal:     General: Bowel sounds are normal. There is no distension.     Palpations: Abdomen is soft.     Tenderness: There is no abdominal tenderness.  Musculoskeletal:        General: No swelling or deformity.  Skin:    General: Skin is warm and dry.  Neurological:     General: No focal deficit present.     Mental Status: Mental status is at baseline.    Labs on Admission: I have personally reviewed following labs and imaging studies  CBC: Recent Labs  Lab 09/26/20 1901 09/26/20 2035  WBC 6.3  --   NEUTROABS 5.6  --   HGB 15.5 15.6  HCT 46.4 46.0  MCV 90.6  --   PLT 88*  --     Basic Metabolic Panel: Recent Labs  Lab 09/26/20 1901 09/26/20 2035  NA 134* 135  K 4.0 3.7  CL 98  --   CO2 24  --   GLUCOSE 121*  --   BUN 25*  --   CREATININE 1.31*  --   CALCIUM 7.7*  --     GFR: Estimated Creatinine Clearance: 80.6 mL/min (A) (by C-G formula based on SCr of 1.31 mg/dL (H)).  Liver Function Tests: Recent Labs  Lab 09/26/20 1901  AST 103*  ALT 64*  ALKPHOS 85  BILITOT 0.9  PROT 6.6  ALBUMIN 2.9*    Urine analysis: No results found for: COLORURINE, APPEARANCEUR, LABSPEC, PHURINE, GLUCOSEU, HGBUR, BILIRUBINUR, KETONESUR, PROTEINUR, UROBILINOGEN, NITRITE, LEUKOCYTESUR  Radiological Exams on Admission: DG Chest Portable 1 View  Result Date: 09/26/2020 CLINICAL DATA:  Cough and shortness of breath. EXAM: PORTABLE CHEST 1 VIEW COMPARISON:  12/04/2019 FINDINGS: Cardiomegaly which is increased from prior exam. Pulmonary edema, moderate in degree. There are bilateral pleural effusions. No confluent consolidation. No pneumothorax. No acute osseous abnormalities are seen. IMPRESSION: CHF with cardiomegaly, pulmonary edema and bilateral pleural effusions. Electronically Signed   By: Keith Rake M.D.   On: 09/26/2020 19:14    EKG: Independently reviewed.  Sinus rhythm, 92 bpm.  Early R wave  progression.  Assessment/Plan Active Problems:   Acute respiratory failure with hypoxia (HCC)  COVID-19 infection Acute hypoxic respiratory failure > Covid pneumonia versus viral induced cardiomyopathy leading to a degree of heart failure.  No clear infiltrates on chest x-ray but did show some edema and effusions > Given oxygen requirements and Covid positive status and progressive symptoms over the past week patient started on remdesivir and steroids in ED > BNP normal, troponin normal, Status post 60 mg IV Lasix in ED > Failing wean off NRB in ED, Sats high 90s on NRB, drop to 80s with [place] even at 10-12L, will need NRB vs High-flow (Keep air humidified given history of epistaxis) - Oxygen supplementation as needed - Continue remdesivir and switch to Decadron - Monitor response to IV Lasix, whether this is cardiac versus early Covid pneumonia IV Lasix appropriate to keep patient on drier side - Repeat CXR in AM to reevaluate edema vs PNA, may need CT if still in question - Echocardiogram - Strict I/O, daily weights - Pulmonary hygiene - Trend CBC, CMP, CRP, D-dimer, ferritin, mag, Phos  CAD status post DES x2 > Not currently on any home medications for this > Troponin negative, repeat pending  Thrombocytopenia > Plts 88 - Trend CBC  Elevated Creatinine > CR 1.3, up from 1.05 over unclear time period, does not quite meet definition for AKI - Continue to monitor in the setting of diuresis and pulm edema  DVT prophylaxis: Lovenox, Monitor Plts Code Status:   Full Family Communication:  Updated Wife by phone. Disposition Plan:   Patient is from:  Home  Anticipated DC to:  Pending clinical course  Anticipated DC date:  Pending clinical course  Anticipated DC barriers: None  Consults called:  None  Admission status:  Inpatient, progressive  Severity of Illness: The appropriate patient status for this patient is INPATIENT. Inpatient status is judged to be reasonable and  necessary in order to provide the required intensity of service to ensure the patient's safety. The patient's presenting symptoms, physical exam findings, and initial radiographic and laboratory data in the context of their chronic comorbidities  is felt to place them at high risk for further clinical deterioration. Furthermore, it is not anticipated that the patient will be medically stable for discharge from the hospital within 2 midnights of admission. The following factors support the patient status of inpatient.   " The patient's presenting symptoms include respiratory failure with hypoxia. " The worrisome physical exam findings include rales, tachypnea. " The initial radiographic and laboratory data are worrisome because of edema and effusions on chest x-ray. " The chronic co-morbidities include CAD.   * I certify that at the point of admission it is my clinical judgment that the patient will require inpatient hospital care spanning beyond 2 midnights from the point of admission due to high intensity of service, high risk for further deterioration and high frequency of surveillance required.Marcelyn Bruins MD [facility]  How to contact the [facility] Attending or Consulting provider [place] or covering provider during after hours [place], for this patient?   1. Check the care team in [facility] and look for a) attending/consulting [facility] provider listed and b) the [facility] team listed 2. Log into [email or web address] and use [place]'s universal password to access. If you do not have the password, please contact the hospital operator. 3. Locate the [facility] provider you are looking for under [facility] and page to a number that you can be directly reached. 4. If you still have difficulty reaching the provider, please page the [facility] (Director on Call) for the Hospitalists listed on amion for assistance.  09/26/2020, 10:22 PM

## 2020-09-26 NOTE — ED Triage Notes (Signed)
Pt BIB ems from home; c/o SOB x 3 hours; watching TV when started; drove to [facility]; no hx respiratory issues; feeling "bad last week" with stomach bug; 78% RA, rales in all fields with ems; non productive cough; 90% 15L; work of breathing improved on NRB, STILL SAYS HE FEELS WINDED; 1 NITRO GIVEN FOR PULMONARY EDEMA; initally c/o tightness in chest, which has resolved with ems; 12 lead unremarkable; hx stent placement; no covid vaccine  140/100 Hr 90S CBG 137 RR 24

## 2020-09-27 ENCOUNTER — Encounter (HOSPITAL_COMMUNITY): Payer: Self-pay | Admitting: Internal Medicine

## 2020-09-27 ENCOUNTER — Inpatient Hospital Stay (HOSPITAL_COMMUNITY): Payer: HRSA Program

## 2020-09-27 ENCOUNTER — Other Ambulatory Visit (HOSPITAL_COMMUNITY): Payer: No Typology Code available for payment source

## 2020-09-27 ENCOUNTER — Other Ambulatory Visit: Payer: Self-pay

## 2020-09-27 DIAGNOSIS — J810 Acute pulmonary edema: Secondary | ICD-10-CM

## 2020-09-27 DIAGNOSIS — I2510 Atherosclerotic heart disease of native coronary artery without angina pectoris: Secondary | ICD-10-CM

## 2020-09-27 DIAGNOSIS — I517 Cardiomegaly: Secondary | ICD-10-CM

## 2020-09-27 LAB — COMPREHENSIVE METABOLIC PANEL
ALT: 119 U/L — ABNORMAL HIGH (ref 0–44)
AST: 176 U/L — ABNORMAL HIGH (ref 15–41)
Albumin: 2.9 g/dL — ABNORMAL LOW (ref 3.5–5.0)
Alkaline Phosphatase: 91 U/L (ref 38–126)
Anion gap: 11 (ref 5–15)
BUN: 23 mg/dL — ABNORMAL HIGH (ref 6–20)
CO2: 21 mmol/L — ABNORMAL LOW (ref 22–32)
Calcium: 7.7 mg/dL — ABNORMAL LOW (ref 8.9–10.3)
Chloride: 101 mmol/L (ref 98–111)
Creatinine, Ser: 1.11 mg/dL (ref 0.61–1.24)
GFR, Estimated: >60 mL/min (ref >60)
Glucose, Bld: 174 mg/dL — ABNORMAL HIGH (ref 70–99)
Potassium: 4.3 mmol/L (ref 3.5–5.1)
Sodium: 133 mmol/L — ABNORMAL LOW (ref 135–145)
Total Bilirubin: 0.9 mg/dL (ref 0.3–1.2)
Total Protein: 6.9 g/dL (ref 6.5–8.1)

## 2020-09-27 LAB — CBC WITH DIFFERENTIAL/PLATELET
Abs Immature Granulocytes: 0.02 10*3/uL (ref 0.00–0.07)
Basophils Absolute: 0 10*3/uL (ref 0.0–0.1)
Basophils Relative: 0 %
Eosinophils Absolute: 0 10*3/uL (ref 0.0–0.5)
Eosinophils Relative: 0 %
HCT: 44.4 % (ref 39.0–52.0)
Hemoglobin: 15.6 g/dL (ref 13.0–17.0)
Immature Granulocytes: 0 %
Lymphocytes Relative: 8 %
Lymphs Abs: 0.4 10*3/uL — ABNORMAL LOW (ref 0.7–4.0)
MCH: 31.1 pg (ref 26.0–34.0)
MCHC: 35.1 g/dL (ref 30.0–36.0)
MCV: 88.6 fL (ref 80.0–100.0)
Monocytes Absolute: 0.1 10*3/uL (ref 0.1–1.0)
Monocytes Relative: 2 %
Neutro Abs: 4.7 10*3/uL (ref 1.7–7.7)
Neutrophils Relative %: 90 %
Platelets: 90 10*3/uL — ABNORMAL LOW (ref 150–400)
RBC: 5.01 MIL/uL (ref 4.22–5.81)
RDW: 13.6 % (ref 11.5–15.5)
WBC: 5.2 10*3/uL (ref 4.0–10.5)
nRBC: 0 % (ref 0.0–0.2)

## 2020-09-27 LAB — LACTIC ACID, PLASMA
Lactic Acid, Venous: 1.8 mmol/L (ref 0.5–1.9)
Lactic Acid, Venous: 2 mmol/L (ref 0.5–1.9)

## 2020-09-27 LAB — FERRITIN
Ferritin: 3737 ng/mL — ABNORMAL HIGH (ref 24–336)
Ferritin: 3855 ng/mL — ABNORMAL HIGH (ref 24–336)

## 2020-09-27 LAB — D-DIMER, QUANTITATIVE
D-Dimer, Quant: 5.15 ug/mL-FEU — ABNORMAL HIGH (ref 0.00–0.50)
D-Dimer, Quant: 4.3 ug/mL-FEU — ABNORMAL HIGH (ref 0.00–0.50)

## 2020-09-27 LAB — LACTATE DEHYDROGENASE: LDH: 1102 U/L — ABNORMAL HIGH (ref 98–192)

## 2020-09-27 LAB — C-REACTIVE PROTEIN
CRP: 16.8 mg/dL — ABNORMAL HIGH (ref <1.0)
CRP: 16.1 mg/dL — ABNORMAL HIGH (ref <1.0)

## 2020-09-27 LAB — FIBRINOGEN: Fibrinogen: 730 mg/dL — ABNORMAL HIGH (ref 210–475)

## 2020-09-27 LAB — PROCALCITONIN: Procalcitonin: 0.65 ng/mL

## 2020-09-27 LAB — PHOSPHORUS: Phosphorus: 3.1 mg/dL (ref 2.5–4.6)

## 2020-09-27 LAB — TRIGLYCERIDES: Triglycerides: 197 mg/dL — ABNORMAL HIGH (ref <150)

## 2020-09-27 LAB — MAGNESIUM: Magnesium: 2.7 mg/dL — ABNORMAL HIGH (ref 1.7–2.4)

## 2020-09-27 MED ORDER — ENOXAPARIN SODIUM 120 MG/0.8ML ~~LOC~~ SOLN
114.0000 mg | Freq: Two times a day (BID) | SUBCUTANEOUS | Status: DC
  Administered 2020-09-27 – 2020-09-28 (×3): 114 mg via SUBCUTANEOUS
  Filled 2020-09-27 (×4): qty 0.76

## 2020-09-27 MED ORDER — ONDANSETRON HCL 4 MG/2ML IJ SOLN: 4.0000 mg | Freq: Four times a day (QID) | INTRAMUSCULAR | Status: DC | PRN

## 2020-09-27 MED ORDER — BARICITINIB 2 MG PO TABS
4.0000 mg | ORAL_TABLET | Freq: Every day | ORAL | Status: AC
  Administered 2020-09-27 – 2020-10-10 (×14): 4 mg via ORAL
  Filled 2020-09-27 (×14): qty 2

## 2020-09-27 MED ORDER — FUROSEMIDE 40 MG PO TABS
40.0000 mg | ORAL_TABLET | Freq: Every day | ORAL | Status: AC
  Administered 2020-09-28 – 2020-09-29 (×3): 40 mg via ORAL
  Filled 2020-09-27 (×2): qty 1

## 2020-09-27 MED ORDER — IOHEXOL 350 MG/ML SOLN
100.0000 mL | Freq: Once | INTRAVENOUS | Status: AC | PRN
  Administered 2020-09-27: 100 mL via INTRAVENOUS

## 2020-09-27 MED ORDER — METHYLPREDNISOLONE SODIUM SUCC 125 MG IJ SOLR
60.0000 mg | Freq: Two times a day (BID) | INTRAMUSCULAR | Status: DC
  Administered 2020-09-27 – 2020-10-09 (×25): 60 mg via INTRAVENOUS
  Filled 2020-09-27 (×25): qty 2

## 2020-09-27 NOTE — ED Notes (Signed)
Attempted to give reportx1 

## 2020-09-27 NOTE — ED Notes (Signed)
Pt transferred to a hospital bed

## 2020-09-27 NOTE — Progress Notes (Signed)
  Echocardiogram 2D Echocardiogram was attempted but patient was in CT. We will try again as the schedule permits.   Jennette Dubin 09/27/2020, 2:16 PM

## 2020-09-27 NOTE — Progress Notes (Signed)
ANTICOAGULATION CONSULT NOTE - Initial Consult  Pharmacy Consult for lovenox Indication: r/o DVT/PE in setting of COVID and elevated d-dimer  Allergies  Allergen Reactions  . Bee Venom   . Codeine Nausea And Vomiting  . Onion Swelling    "thoart swells up"    Patient Measurements: Height: 5\' 11"  (180.3 cm) Weight: 113.4 kg (250 lb) IBW/kg (Calculated) : 75.3  Vital Signs: Temp: 97 F (36.1 C) (12/17 0348) Temp Source: Oral (12/17 0348) BP: 112/71 (12/17 1145) Pulse Rate: 78 (12/17 1145)  Labs: Recent Labs    09/26/20 1901 09/26/20 2035 09/26/20 2101 09/27/20 0432  HGB 15.5 15.6  --  15.6  HCT 46.4 46.0  --  44.4  PLT 88*  --   --  90*  CREATININE 1.31*  --   --  1.11  TROPONINIHS 8  --  9  --     Estimated Creatinine Clearance: 95.1 mL/min (by C-G formula based on SCr of 1.11 mg/dL).   Medical History: Past Medical History:  Diagnosis Date  . Coronary artery disease per pt last cardiology visit 2011 (found documentation 12-05-2009 w/ dr Leonia Reeves with [facility] in epic)/  currently pt is followed by pcp   03-Matthew-2010  PCI and DES x2 to proximal and mid RCA/ last cardiac cath 12-09-2009  widely patent stents  . GERD (gastroesophageal reflux disease)   . Hyperlipidemia   . Olecranon bursitis of left elbow   . Respiratory failure ([place])   . S/P right coronary artery (RCA) stent placement 03/Matthew/2010   PCI and DES x2-- proximal and mid RCA  . Wears dentures    UPPER    Assessment: Matthew [name] presenting with SOB, COVID + and D-dimer 4.30 to start therapeutic lovenox for rule out of DVT/PE and d-dimer trend.  Not on anticoagulation PTA, given lovenox 55mg  SQ last night.  Plts from 88>90  Goal of Therapy:  Anti-Xa level 0.6-1 units/ml 4hrs after LMWH dose given Monitor platelets by anticoagulation protocol: Yes   Plan:  Lovenox 1mg /kg (114mg ) SQ every 12 hours Monitor renal function, s/s bleeding, watch plts F/u DVT/PE workup and D-dimer trend  Bertis Ruddy,  PharmD Clinical Pharmacist ED Pharmacist Phone # [phone] 09/27/2020 12:02 PM

## 2020-09-27 NOTE — TOC Initial Note (Signed)
Transition of Care [facility]) - Initial/Assessment Note    Patient Details  Name: Matthew [name] MRN: [id number] Date of Birth: 08-Jul-1966  [facility] CM/SW Contact:    Verdell Carmine, RN Phone Number: 09/27/2020, 2:13 PM  Clinical Narrative:                 Patient with 4-5 days of symptoms, recently at urgent care for N&V declined COVID testing at that time. Presents now with hypoxia, on 15L NRB with o2 sats 90%. Lives with spouse.  Uninsured with no PCP at present. Unsure if employed, may need MATCH for medications and [place] oxygen CM will follow through hospital stay for needs.    Expected Discharge Plan: Home/Self Care Barriers to Discharge: Continued Medical Work up   Patient Goals and CMS Choice        Expected Discharge Plan and Services Expected Discharge Plan: Home/Self Care   Discharge Planning Services: CM Consult   Living arrangements for the past 2 months: Single Family Home                                      Prior Living Arrangements/Services Living arrangements for the past 2 months: Single Family Home Lives with:: Spouse Patient language and need for interpreter reviewed:: Yes        Need for Family Participation in Patient Care: Yes (Comment) Care giver support system in place?: Yes (comment)   Criminal Activity/Legal Involvement Pertinent to Current Situation/Hospitalization: No - Comment as needed  Activities of Daily Living      Permission Sought/Granted                  Emotional Assessment       Orientation: : Oriented to Self,Oriented to Place,Oriented to  Time,Oriented to Situation Alcohol / Substance Use: Not Applicable Psych Involvement: No (comment)  Admission diagnosis:  Acute respiratory failure with hypoxia ([place]) [J96.01] Patient Active Problem List   Diagnosis Date Noted  . Acute respiratory failure with hypoxia ([place]) 09/26/2020  . CAD (coronary artery disease) 09/26/2020  . COVID-19  virus infection 09/26/2020  . Tobacco abuse 12/04/2019  . Respiratory failure ([place])   . Hemoptysis 11/17/2019   PCP:  Patient, No Pcp Per Pharmacy:   [place] [place] [address] Phone: [phone] Fax: [phone]     Social Determinants of Health (SDOH) Interventions    Readmission Risk Interventions No flowsheet data found.

## 2020-09-27 NOTE — ED Notes (Signed)
Breakfast ordered 

## 2020-09-27 NOTE — Progress Notes (Addendum)
[facility]                                                                              Matthew [name] Demographics  Matthew [name], is a 54 y.o. male, DOB - [birth year], [id number]  Admit date - 09/26/2020   Admitting Physician Marcelyn Bruins, MD  Outpatient Primary MD for the Matthew [name] is Matthew [name], No Pcp Per  Outpatient specialists:   LOS - 1  days   Medical records reviewed and are as summarized below:    No chief complaint on file.      Brief summary   Matthew [name] is a 54 year old male with history of CAD status post stent x2, tobacco use (78-pack-year history, quit in February, 2021),  HLP, GERD, presented with shortness of breath.  Matthew [name] had gradual worsening of shortness of breath since 12/9 and GI illness from around 12/10-12/14.  He was seen at the urgent care for diarrhea, nausea and vomiting.  Matthew [name] was treated with fluids and supportive care, had declined Covid test at that time.  As shortness of breath progressively worsened, Matthew [name] called the EMS, was found to have O2 sats 78% on room air with significant rails.  Matthew [name] was placed on 15 L NRB with improvement to 90%.  Vaccination status: Matthew [name] is unvaccinated for COVID-19  In ED, Matthew [name] was tachypneic with respiratory rate 20s to 30s, requiring 6 to 10 L O2 via [place].  Creatinine 1.3, AST 103, ALT 64.  Platelets 88. Covid test positive. Chest x-ray showed cardiomegaly with some pulmonary edema and effusions.  Matthew [name] was given a dose of Lasix in ED and was started on IV steroids and remdesivir   Assessment & Plan    Principal Problem:  Acute hypoxic respiratory failure due to acute COVID-19 viral pneumonia during the ongoing COVID-19 pandemic- POA - Matthew [name] presented with worsening shortness of breath, found to be hypoxic, chest x-ray showed pulmonary edema with effusions, Covid test positive, unvaccinated.  At the time of my examination, on NRB 15 L O2 -Placed on IV Solu-Medrol, 60 mg every  12 hours, IV remdesivir per pharmacy  -Discussed risks and benefits of baricitinib with the Matthew [name], he consented to receiving baricitinib -D-dimer elevated, will obtain CT angiogram to rule out PE, placed on therapeutic Lovenox until PE ruled out and D-dimer persistently <2 - Continue Supportive care: vitamin C/zinc, albuterol, Tylenol, I-S, flutter valve, proning advised. - Continue to wean oxygen, ambulatory O2 screening daily as tolerated  - Oxygen - SpO2: 91 % O2 Flow Rate (L/min): 15 L/min NRB - addendum: CT angiogram chest negative for PE - Continue to follow labs as below  Lab Results  Component Value Date   SARSCOV2NAA POSITIVE (A) 09/26/2020   SARSCOV2NAA NEGATIVE 02/06/2020   [place] NEGATIVE 11/17/2019     Recent Labs  Lab 09/26/20 1901 09/27/20 0023 09/27/20 0432  DDIMER  --  5.15* 4.30*  FERRITIN  --  3,737* 3,855*  CRP  --  16.8* 16.1*  ALT 64*  --  119*  PROCALCITON  --  0.65  --    Acute CHF with cardiomegaly, pulmonary edema, bilateral pleural effusion -Received Lasix 60 mg IV  x1 in ED, BNP 23.4, troponin 9 -Follow 2D echo, strict I's and O's and daily weights - will continue oral Lasix 40 mg daily x3, follow renal function, BP  History of CAD status post stents x2 -Troponins negative, currently no chest pain, not on any home medications for this  Acute on chronic thrombocytopenia -Possibly worsened due to COVID-19, platelets 88K on admission, improved to 90K today. -Follow closely with Lovenox until PE is ruled out   GERD -Continue PPI  Mild acute kidney injury  -Likely due to nausea vomiting and diarrhea, poor oral intake -Baseline creatinine 1.0, presented with creatinine of 1.3 -Improving, creatinine 1.1 today  History of hemoptysis complicated by epistaxis in 11/2019 -Follows Dr. Inez Catalina, was found to have clot in LUL bronchus -Follow closely with Lovenox while ruling out PE and thrombocytopenia, will hold if any signs of  bleeding  Obesity Estimated body mass index is 34.87 kg/m as calculated from the following:   Height as of this encounter: 5\' 11"  (1.803 m).   Weight as of this encounter: 113.4 kg.  Code Status: Full CODE STATUS DVT Prophylaxis: Lovenox Family Communication: Discussed all imaging results, lab results, explained to the Matthew [name]'s wife, [name] [phone] (she is also covid positive)     Disposition Plan:     Status is: Inpatient  Remains inpatient appropriate because:Inpatient level of care appropriate due to severity of illness   Dispo: The Matthew [name] is from: Home              Anticipated d/c is to: Home              Anticipated d/c date is: > 3 days              Matthew [name] currently is not medically stable to d/c.  Worsening hypoxia on 15 L O2 via NRB, COVID-19 active treatment      Time Spent in minutes 25 min  Procedures:  2D echo  Consultants:   None  Antimicrobials:   Anti-infectives (From admission, onward)   Start     Dose/Rate Route Frequency Ordered Stop   09/27/20 1000  remdesivir 100 mg in sodium chloride 0.9 % 100 mL IVPB       "Followed by" Linked Group Details   100 mg 200 mL/hr over 30 Minutes Intravenous Daily 09/26/20 2208 10/01/20 0959   09/26/20 2215  remdesivir 200 mg in sodium chloride 0.9% 250 mL IVPB       "Followed by" Linked Group Details   200 mg 580 mL/hr over 30 Minutes Intravenous Once 09/26/20 2208 09/27/20 0146          Medications  Scheduled Meds: . enoxaparin (LOVENOX) injection  55 mg Subcutaneous Q24H  . methylPREDNISolone (SOLU-MEDROL) injection  60 mg Intravenous BID  . sodium chloride flush  3 mL Intravenous Q12H   Continuous Infusions: . remdesivir 100 mg in NS 100 mL     PRN Meds:.acetaminophen, polyethylene glycol      Subjective:   Stanley Lyness was seen and examined today.  Still short of breath, on NRB 15 L.  No chest pain.  No fevers or chills. Matthew [name] denies dizziness,  abdominal pain,  N/V/D/C, new weakness, numbess, tingling.  Objective:   Vitals:   09/27/20 0915 09/27/20 0930 09/27/20 0945 09/27/20 1145  BP:    112/71  Pulse: 72 79 76 78  Resp: (!) 27 (!) 38 18 18  Temp:      TempSrc:      SpO2: 90%  93% 91% 91%  Weight:      Height:        Intake/Output Summary (Last 24 hours) at 09/27/2020 1148 Last data filed at 09/26/2020 2100 Gross per 24 hour  Intake --  Output 400 ml  Net -400 ml     Wt Readings from Last 3 Encounters:  09/26/20 113.4 kg  12/04/19 108.4 kg  11/20/19 107.9 kg     Exam  General: Alert and oriented x 3, in mild respiratory distress on NRB  Cardiovascular: S1 S2 auscultated, no murmurs, RRR  Respiratory: Decreased breath sound at the bases  Gastrointestinal: Soft, nontender, nondistended, + bowel sounds  Ext: no pedal edema bilaterally  Neuro: no new deficits  Musculoskeletal: No digital cyanosis, clubbing  Skin: No rashes  Psych: Normal affect and demeanor, alert and oriented x3    Data Reviewed:  I have personally reviewed following labs and imaging studies  Micro Results Recent Results (from the past 240 hour(s))  Resp Panel by RT-PCR (Flu A&B, Covid) Nasopharyngeal Swab     Status: Abnormal   Collection Time: 09/26/20  7:03 PM   Specimen: Nasopharyngeal Swab; Nasopharyngeal(NP) swabs in vial transport medium  Result Value Ref Range Status   SARS Coronavirus 2 by RT PCR POSITIVE (A) NEGATIVE Final    Comment: RESULT CALLED TO, READ BACK BY AND VERIFIED WITH: G TATE RN 09/26/20 2143 JDW (NOTE) SARS-CoV-2 target nucleic acids are DETECTED.  The SARS-CoV-2 RNA is generally detectable in upper respiratory specimens during the acute phase of infection. Positive results are indicative of the presence of the identified virus, but do not rule out bacterial infection or co-infection with other pathogens not detected by the test. Clinical correlation with Matthew [name] history and other diagnostic information is  necessary to determine Matthew [name] infection status. The expected result is Negative.  Fact Sheet for Patients: EntrepreneurPulse.com.au  Fact Sheet for Healthcare Providers: IncredibleEmployment.be  This test is not yet approved or cleared by the [place] FDA and  has been authorized for detection and/or diagnosis of SARS-CoV-2 by FDA under an Emergency Use Authorization (EUA).  This EUA will remain in effect (meaning this test can be used)  for the duration of  the COVID-19 declaration under Section 564(b)(1) of the Act, 21 U.S.C. section 360bbb-3(b)(1), unless the authorization is terminated or revoked sooner.     Influenza A by PCR NEGATIVE NEGATIVE Final   Influenza B by PCR NEGATIVE NEGATIVE Final    Comment: (NOTE) The Xpert Xpress SARS-CoV-2/FLU/RSV plus assay is intended as an aid in the diagnosis of influenza from Nasopharyngeal swab specimens and should not be used as a sole basis for treatment. Nasal washings and aspirates are unacceptable for Xpert Xpress SARS-CoV-2/FLU/RSV testing.  Fact Sheet for Patients: EntrepreneurPulse.com.au  Fact Sheet for Healthcare Providers: IncredibleEmployment.be  This test is not yet approved or cleared by the [place] FDA and has been authorized for detection and/or diagnosis of SARS-CoV-2 by FDA under an Emergency Use Authorization (EUA). This EUA will remain in effect (meaning this test can be used) for the duration of the COVID-19 declaration under Section 564(b)(1) of the Act, 21 U.S.C. section 360bbb-3(b)(1), unless the authorization is terminated or revoked.  Performed at [facility], [place] [address], [place], [place] [address]   Blood Culture (routine x 2)     Status: None (Preliminary result)   Collection Time: 09/27/20 12:31 AM   Specimen: BLOOD  Result Value Ref Range Status   Specimen Description BLOOD SITE NOT  SPECIFIED  Final    Special Requests   Final    BOTTLES DRAWN AEROBIC AND ANAEROBIC Blood Culture adequate volume   Culture   Final    NO GROWTH < 12 HOURS Performed at [facility], [address], [place], [place] [address]    Report Status PENDING  Incomplete    Radiology Reports DG CHEST PORT 1 VIEW  Result Date: 09/26/2020 CLINICAL DATA:  COVID-19 EXAM: PORTABLE CHEST 1 VIEW COMPARISON:  09/26/2020 FINDINGS: Slight worsening of bilateral basilar predominant opacities. No pleural effusion or pneumothorax. Normal cardiomediastinal contours. IMPRESSION: Slight worsening of bilateral basilar predominant opacities. Electronically Signed   By: Ulyses Jarred M.D.   On: 09/26/2020 23:26   DG Chest Portable 1 View  Result Date: 09/26/2020 CLINICAL DATA:  Cough and shortness of breath. EXAM: PORTABLE CHEST 1 VIEW COMPARISON:  12/04/2019 FINDINGS: Cardiomegaly which is increased from prior exam. Pulmonary edema, moderate in degree. There are bilateral pleural effusions. No confluent consolidation. No pneumothorax. No acute osseous abnormalities are seen. IMPRESSION: CHF with cardiomegaly, pulmonary edema and bilateral pleural effusions. Electronically Signed   By: Keith Rake M.D.   On: 09/26/2020 19:14    Lab Data:  CBC: Recent Labs  Lab 09/26/20 1901 09/26/20 2035 09/27/20 0432  WBC 6.3  --  5.2  NEUTROABS 5.6  --  4.7  HGB 15.5 15.6 15.6  HCT 46.4 46.0 44.4  MCV 90.6  --  88.6  PLT 88*  --  90*   Basic Metabolic Panel: Recent Labs  Lab 09/26/20 1901 09/26/20 2035 09/27/20 0432  NA 134* 135 133*  K 4.0 3.7 4.3  CL 98  --  101  CO2 24  --  21*  GLUCOSE 121*  --  174*  BUN 25*  --  23*  CREATININE 1.31*  --  1.11  CALCIUM 7.7*  --  7.7*  MG  --   --  2.7*  PHOS  --   --  3.1   GFR: Estimated Creatinine Clearance: 95.1 mL/min (by C-G formula based on SCr of 1.11 mg/dL). Liver Function Tests: Recent Labs  Lab 09/26/20 1901 09/27/20 0432  AST 103* 176*  ALT 64* 119*   ALKPHOS 85 91  BILITOT 0.9 0.9  PROT 6.6 6.9  ALBUMIN 2.9* 2.9*   No results for input(s): LIPASE, AMYLASE in the last 168 hours. No results for input(s): AMMONIA in the last 168 hours. Coagulation Profile: No results for input(s): INR, PROTIME in the last 168 hours. Cardiac Enzymes: No results for input(s): CKTOTAL, CKMB, CKMBINDEX, TROPONINI in the last 168 hours. BNP (last 3 results) No results for input(s): PROBNP in the last 8760 hours. HbA1C: No results for input(s): HGBA1C in the last 72 hours. CBG: No results for input(s): GLUCAP in the last 168 hours. Lipid Profile: Recent Labs    09/27/20 0023  TRIG 197*   Thyroid Function Tests: No results for input(s): TSH, T4TOTAL, FREET4, T3FREE, THYROIDAB in the last 72 hours. Anemia Panel: Recent Labs    09/27/20 0023 09/27/20 0432  FERRITIN 3,737* 3,855*   Urine analysis: No results found for: COLORURINE, APPEARANCEUR, LABSPEC, PHURINE, GLUCOSEU, HGBUR, BILIRUBINUR, KETONESUR, PROTEINUR, UROBILINOGEN, NITRITE, LEUKOCYTESUR   Florinda Taflinger M.D. [facility] 09/27/2020, 11:48 AM   Call night coverage person covering after 7pm

## 2020-09-27 NOTE — ED Notes (Signed)
Pt stated wife is currently updated as he spoke to her.

## 2020-09-27 NOTE — ED Notes (Signed)
Pt taken off NRB. Placed on high flow. 10liters

## 2020-09-27 NOTE — ED Notes (Signed)
The wife Matthew [name] would like a call back with an update [phone]. Please DO NOT refer to her as Andrius Andrepont she does not go by this last name even though they are married!!!!

## 2020-09-28 ENCOUNTER — Inpatient Hospital Stay (HOSPITAL_COMMUNITY): Payer: HRSA Program

## 2020-09-28 DIAGNOSIS — J9601 Acute respiratory failure with hypoxia: Secondary | ICD-10-CM

## 2020-09-28 LAB — COMPREHENSIVE METABOLIC PANEL
ALT: 114 U/L — ABNORMAL HIGH (ref 0–44)
AST: 108 U/L — ABNORMAL HIGH (ref 15–41)
Albumin: 2.8 g/dL — ABNORMAL LOW (ref 3.5–5.0)
Alkaline Phosphatase: 94 U/L (ref 38–126)
Anion gap: 12 (ref 5–15)
BUN: 25 mg/dL — ABNORMAL HIGH (ref 6–20)
CO2: 22 mmol/L (ref 22–32)
Calcium: 8 mg/dL — ABNORMAL LOW (ref 8.9–10.3)
Chloride: 99 mmol/L (ref 98–111)
Creatinine, Ser: 0.95 mg/dL (ref 0.61–1.24)
GFR, Estimated: >60 mL/min (ref >60)
Glucose, Bld: 171 mg/dL — ABNORMAL HIGH (ref 70–99)
Potassium: 3.9 mmol/L (ref 3.5–5.1)
Sodium: 133 mmol/L — ABNORMAL LOW (ref 135–145)
Total Bilirubin: 1.3 mg/dL — ABNORMAL HIGH (ref 0.3–1.2)
Total Protein: 7 g/dL (ref 6.5–8.1)

## 2020-09-28 LAB — CBC WITH DIFFERENTIAL/PLATELET
Abs Immature Granulocytes: 0.03 10*3/uL (ref 0.00–0.07)
Basophils Absolute: 0 10*3/uL (ref 0.0–0.1)
Basophils Relative: 0 %
Eosinophils Absolute: 0 10*3/uL (ref 0.0–0.5)
Eosinophils Relative: 0 %
HCT: 44.5 % (ref 39.0–52.0)
Hemoglobin: 15.1 g/dL (ref 13.0–17.0)
Immature Granulocytes: 1 %
Lymphocytes Relative: 16 %
Lymphs Abs: 0.7 10*3/uL (ref 0.7–4.0)
MCH: 30 pg (ref 26.0–34.0)
MCHC: 33.9 g/dL (ref 30.0–36.0)
MCV: 88.5 fL (ref 80.0–100.0)
Monocytes Absolute: 0.3 10*3/uL (ref 0.1–1.0)
Monocytes Relative: 6 %
Neutro Abs: 3.3 10*3/uL (ref 1.7–7.7)
Neutrophils Relative %: 77 %
Platelets: 115 10*3/uL — ABNORMAL LOW (ref 150–400)
RBC: 5.03 MIL/uL (ref 4.22–5.81)
RDW: 13.4 % (ref 11.5–15.5)
WBC: 4.3 10*3/uL (ref 4.0–10.5)
nRBC: 0 % (ref 0.0–0.2)

## 2020-09-28 LAB — FERRITIN: Ferritin: 3088 ng/mL — ABNORMAL HIGH (ref 24–336)

## 2020-09-28 LAB — D-DIMER, QUANTITATIVE: D-Dimer, Quant: 2.37 ug/mL-FEU — ABNORMAL HIGH (ref 0.00–0.50)

## 2020-09-28 LAB — ECHOCARDIOGRAM LIMITED
Area-P 1/2: 2.91 cm2
Height: 71 in
S' Lateral: 2.8 cm
Weight: 3908.31 oz

## 2020-09-28 LAB — PHOSPHORUS: Phosphorus: 2.9 mg/dL (ref 2.5–4.6)

## 2020-09-28 LAB — C-REACTIVE PROTEIN: CRP: 11.9 mg/dL — ABNORMAL HIGH (ref <1.0)

## 2020-09-28 LAB — MAGNESIUM: Magnesium: 2.8 mg/dL — ABNORMAL HIGH (ref 1.7–2.4)

## 2020-09-28 LAB — MRSA PCR SCREENING: MRSA by PCR: NEGATIVE

## 2020-09-28 MED ORDER — SALINE SPRAY 0.65 % NA SOLN
1.0000 | NASAL | Status: DC | PRN
  Administered 2020-09-29: 10:00:00 1 via NASAL
  Filled 2020-09-28: qty 44

## 2020-09-28 MED ORDER — ENOXAPARIN SODIUM 60 MG/0.6ML ~~LOC~~ SOLN
55.0000 mg | Freq: Every day | SUBCUTANEOUS | Status: DC
  Administered 2020-09-29 – 2020-10-08 (×10): 55 mg via SUBCUTANEOUS
  Filled 2020-09-28 (×10): qty 0.6

## 2020-09-28 MED ORDER — MELATONIN 3 MG PO TABS
3.0000 mg | ORAL_TABLET | Freq: Every day | ORAL | Status: DC
Start: 2020-09-28 — End: 2020-11-16
  Administered 2020-09-28 – 2020-11-14 (×42): 3 mg via ORAL
  Filled 2020-09-28 (×49): qty 1

## 2020-09-28 NOTE — Progress Notes (Signed)
°  Echocardiogram 2D Echocardiogram has been performed.  Matthew [name] 09/28/2020, 2:24 PM

## 2020-09-28 NOTE — Progress Notes (Addendum)
[facility]                                                                              Patient Demographics  Matthew [name], is a 54 y.o. male, DOB - 11/10/1965, TKP:[id number]  Admit date - 09/26/2020   Admitting Physician Matthew Bruins, MD  Outpatient Primary MD for the patient is Matthew [name]  Outpatient specialists:   LOS - 2  days   Medical records reviewed and are as summarized below:    No chief complaint on file.      Brief summary   Patient is a 54 year old male with history of CAD status post stent x2, tobacco use (78-pack-year history, quit in February, 2021),  HLP, GERD, presented with shortness of breath.  Patient had gradual worsening of shortness of breath since 12/9 and GI illness from around 12/10-12/14.  He was seen at the urgent care for diarrhea, nausea and vomiting.  Patient was treated with fluids and supportive care, had declined Covid test at that time.  As shortness of breath progressively worsened, patient called the EMS, was found to have O2 sats 78% on room air with significant rails.  Patient was placed on 15 L NRB with improvement to 90%.  Vaccination status: Patient is unvaccinated for COVID-19  In ED, patient was tachypneic with respiratory rate 20s to 30s, requiring 6 to 10 L O2 via [place].  Creatinine 1.3, AST 103, ALT 64.  Platelets 88. Covid test positive. Chest x-ray showed cardiomegaly with some pulmonary edema and effusions.  Patient was given a dose of Lasix in ED and was started on IV steroids and remdesivir   Assessment & Plan    Principal Problem:  Acute hypoxic respiratory failure due to acute COVID-19 viral pneumonia during the ongoing COVID-19 pandemic- POA - Patient presented with worsening shortness of breath, found to be hypoxic, chest x-ray showed pulmonary edema with effusions, Covid test positive, unvaccinated.   - Continue IV Solu-Medrol, 60 mg every 12 hours, IV remdesivir, baricitinib - CT  angiogram chest negative for PE - Continue Supportive care: vitamin C/zinc, albuterol, Tylenol, I-S, flutter valve, proning advised. - Continue to wean oxygen, ambulatory O2 screening daily as tolerated  - Oxygen - SpO2: 94 % O2 Flow Rate (L/min): 15 L/min NRB - Continue to follow labs as below  Lab Results  Component Value Date   SARSCOV2NAA POSITIVE (A) 09/26/2020   SARSCOV2NAA NEGATIVE 02/06/2020   Matthew [name] NEGATIVE 11/17/2019     Recent Labs  Lab 09/26/20 1901 09/27/20 0023 09/27/20 0432 09/28/20 0007  DDIMER  --  5.15* 4.30* 2.37*  FERRITIN  --  3,737* 3,855* 3,088*  CRP  --  16.8* 16.1* 11.9*  ALT 64*  --  119* 114*  PROCALCITON  --  0.65  --   --    Acute CHF with cardiomegaly, pulmonary edema, bilateral pleural effusion -Received Lasix 60 mg IV x1 in ED, BNP 23.4, troponin 9 -Follow 2D echo results, continue strict I's and O's and daily weights, negative balance of 733 cc -Continue Lasix 40 mg daily x3, follow renal function, BP.   History of CAD status  post stents x2 -Troponins negative, currently no chest pain, not on any home medications for this  Acute on chronic thrombocytopenia -Possibly worsened due to COVID-19, platelets 88K on admission, improving   GERD Continue PPI I  Mild acute kidney injury  -Likely due to nausea vomiting and diarrhea, poor oral intake -Baseline creatinine 1.0, presented with creatinine of 1.3 -Resolved, creatinine 0.95.   -Nausea vomiting resolved, tolerating diet.  History of hemoptysis complicated by epistaxis in 11/2019 -Follows Dr. Inez [name], was found to have clot in LUL bronchus   Obesity Estimated body mass index is 34.07 kg/m as calculated from the following:   Height as of this encounter: 5\' 11"  (1.803 m).   Weight as of this encounter: 110.8 kg.  Code Status: Full CODE STATUS DVT Prophylaxis: Lovenox Family Communication: Discussed all imaging results, lab results, explained to the patient's wife, Matthew [name] [phone] (she is also covid positive) on 12/17 and today 12/18  Disposition Plan:     Status is: Inpatient  Remains inpatient appropriate because:Inpatient level of care appropriate due to severity of illness   Dispo: The patient is from: Home              Anticipated d/c is to: Home              Anticipated d/c date is: > 3 days              Patient currently is not medically stable to d/c.  Worsening hypoxia on 15 L O2 via NRB, COVID-19 active treatment  Time Spent in minutes 25 min  Procedures:  2D echo- results pending   Consultants:   None  Antimicrobials:   Anti-infectives (From admission, onward)   Start     Dose/Rate Route Frequency Ordered Stop   09/27/20 1000  remdesivir 100 mg in sodium chloride 0.9 % 100 mL IVPB       "Followed by" Linked Group Details   100 mg 200 mL/hr over 30 Minutes Intravenous Daily 09/26/20 2208 10/01/20 0959   09/26/20 2215  remdesivir 200 mg in sodium chloride 0.9% 250 mL IVPB       "Followed by" Linked Group Details   200 mg 580 mL/hr over 30 Minutes Intravenous Once 09/26/20 2208 09/27/20 0146         Medications  Scheduled Meds: . baricitinib  4 mg Oral Daily  . [START ON 09/29/2020] enoxaparin (LOVENOX) injection  55 mg Subcutaneous Daily  . furosemide  40 mg Oral Daily  . methylPREDNISolone (SOLU-MEDROL) injection  60 mg Intravenous BID  . sodium chloride flush  3 mL Intravenous Q12H   Continuous Infusions: . remdesivir 100 mg in NS 100 mL 100 mg (09/28/20 0912)   PRN Meds:.acetaminophen, ondansetron (ZOFRAN) IV, polyethylene glycol, sodium chloride      Subjective:   Matthew [name] was seen and examined today. Still short of breath, on 15L NRB, no chest pain. No fevers.   Patient denies dizziness,  abdominal pain, N/V/D/C, new weakness, numbess, tingling.  Objective:   Vitals:   09/28/20 0425 09/28/20 0745 09/28/20 0858 09/28/20 1203  BP: 120/83 113/76  120/84  Pulse: 76 77  81  Resp: 17 20   19   Temp: 97.7 F (36.5 C) 97.7 F (36.5 C)  (!) 97.5 F (36.4 C)  TempSrc: Oral Oral  Oral  SpO2: (!) 88% 91% 91% 94%  Weight:      Height:        Intake/Output Summary (Last 24 hours) at 09/28/2020  [place] filed at 09/28/2020 1244 Gross [name] 24 hour  Intake 586.75 ml  Output 920 ml  Net -333.25 ml     Wt Readings from Last 3 Encounters:  09/27/20 110.8 kg  12/04/19 108.4 kg  11/20/19 107.9 kg    Physical Exam  General: Alert and oriented x 3, NAD  Cardiovascular: S1 S2 clear, RRR. No pedal edema b/l  Respiratory: CTAB, no wheezing, rales or rhonchi  Gastrointestinal: Soft, nontender, nondistended, NBS  Ext: no pedal edema bilaterally  Neuro: no new deficits  Musculoskeletal: No cyanosis, clubbing  Skin: No rashes  Psych: Normal affect and demeanor, alert and oriented x3    Data Reviewed:  I have personally reviewed following labs and imaging studies  Micro Results Recent Results (from the past 240 hour(s))  Resp Panel by RT-PCR (Flu A&B, Covid) Nasopharyngeal Swab     Status: Abnormal   Collection Time: 09/26/20  7:03 PM   Specimen: Nasopharyngeal Swab; Nasopharyngeal(NP) swabs in vial transport medium  Result Value Ref Range Status   SARS Coronavirus 2 by RT PCR POSITIVE (A) NEGATIVE Final    Comment: RESULT CALLED TO, READ BACK BY AND VERIFIED WITH: G TATE RN 09/26/20 2143 JDW (NOTE) SARS-CoV-2 target nucleic acids are DETECTED.  The SARS-CoV-2 RNA is generally detectable in upper respiratory specimens during the acute phase of infection. Positive results are indicative of the presence of the identified virus, but do not rule out bacterial infection or co-infection with other pathogens not detected by the test. Clinical correlation with patient history and other diagnostic information is necessary to determine patient infection status. The expected result is Negative.  Fact Sheet for  Patients: EntrepreneurPulse.com.au  Fact Sheet for Healthcare Providers: IncredibleEmployment.be  This test is not yet approved or cleared by the [place] FDA and  has been authorized for detection and/or diagnosis of SARS-CoV-2 by FDA under an Emergency Use Authorization (EUA).  This EUA will remain in effect (meaning this test can be used)  for the duration of  the COVID-19 declaration under Section 564(b)(1) of the Act, 21 U.S.C. section 360bbb-3(b)(1), unless the authorization is terminated or revoked sooner.     Influenza A by PCR NEGATIVE NEGATIVE Final   Influenza B by PCR NEGATIVE NEGATIVE Final    Comment: (NOTE) The Xpert Xpress SARS-CoV-2/FLU/RSV plus assay is intended as an aid in the diagnosis of influenza from Nasopharyngeal swab specimens and should not be used as a sole basis for treatment. Nasal washings and aspirates are unacceptable for Xpert Xpress SARS-CoV-2/FLU/RSV testing.  Fact Sheet for Patients: EntrepreneurPulse.com.au  Fact Sheet for Healthcare Providers: IncredibleEmployment.be  This test is not yet approved or cleared by the [place] FDA and has been authorized for detection and/or diagnosis of SARS-CoV-2 by FDA under an Emergency Use Authorization (EUA). This EUA will remain in effect (meaning this test can be used) for the duration of the COVID-19 declaration under Section 564(b)(1) of the Act, 21 U.S.C. section 360bbb-3(b)(1), unless the authorization is terminated or revoked.  Performed at [facility], [place] [address] [place], [place] [address]   Blood Culture (routine x 2)     Status: None (Preliminary result)   Collection Time: 09/27/20 12:31 AM   Specimen: BLOOD  Result Value Ref Range Status   Specimen Description BLOOD SITE NOT SPECIFIED  Final   Special Requests   Final    BOTTLES DRAWN AEROBIC AND ANAEROBIC Blood Culture adequate volume    Culture   Final  NO GROWTH < 12 HOURS Performed at [facility] [address], [place], [place] [address]    Report Status PENDING  Incomplete  MRSA PCR Screening     Status: None   Collection Time: 09/28/20  4:14 AM   Specimen: Nasal Mucosa; Nasopharyngeal  Result Value Ref Range Status   MRSA by PCR NEGATIVE NEGATIVE Final    Comment:        The GeneXpert MRSA Assay (FDA approved for NASAL specimens only), is one component of a comprehensive MRSA colonization surveillance program. It is not intended to diagnose MRSA infection nor to guide or monitor treatment for MRSA infections. Performed at [facility], [place] [address], [place], [place] [address]     Radiology Reports CT ANGIO CHEST PE W OR WO CONTRAST  Result Date: 09/27/2020 CLINICAL DATA:  54 year old male with history of COVID-19, suspected pulmonary embolism. EXAM: CT ANGIOGRAPHY CHEST WITH CONTRAST TECHNIQUE: Multidetector CT imaging of the chest was performed using the standard protocol during bolus administration of intravenous contrast. Multiplanar CT image reconstructions and MIPs were obtained to evaluate the vascular anatomy. CONTRAST:  100 mL Omnipaque 350, intravenous COMPARISON:  Chest CT from 11/17/2019 FINDINGS: Cardiovascular: Satisfactory opacification of the pulmonary arteries to the segmental level. No evidence of pulmonary embolism. Normal heart size. No pericardial effusion. The heart is normal in size. No pericardial effusion. Severe coronary atherosclerotic calcifications. Mediastinum/Nodes: Similar appearing prominent right paratracheal lymph node measuring up to 1.2 cm in short axis. Otherwise no enlarged mediastinal, hilar, or axillary lymph nodes. Thyroid gland, trachea, and esophagus demonstrate no significant findings. Lungs/Pleura: Interval development of near diffuse, peripherally predominant ground-glass pulmonary opacities with a mid lung and basal predominance. Similar appearing  moderate upper lobe predominant paraseptal and centrilobular emphysema. No pleural effusion or pneumothorax. Upper Abdomen: The visualized upper abdomen is within normal limits. Musculoskeletal: No chest wall abnormality. No acute or significant osseous findings. Review of the MIP images confirms the above findings. IMPRESSION: Vascular: 1. No evidence of pulmonary embolism. 2. Severe coronary atherosclerotic calcifications. Non-Vascular: 1. Diffuse, mid lung and basilar, peripherally predominant ground-glass opacities - findings compatible with multifocal pneumonia associated with COVID-19. 2. Similar appearing moderate upper lobe predominant centrilobular and paraseptal emphysema. Electronically Signed   By: Ruthann Cancer MD   On: 09/27/2020 14:31   DG CHEST PORT 1 VIEW  Result Date: 09/26/2020 CLINICAL DATA:  COVID-19 EXAM: PORTABLE CHEST 1 VIEW COMPARISON:  09/26/2020 FINDINGS: Slight worsening of bilateral basilar predominant opacities. No pleural effusion or pneumothorax. Normal cardiomediastinal contours. IMPRESSION: Slight worsening of bilateral basilar predominant opacities. Electronically Signed   By: Ulyses Jarred M.D.   On: 09/26/2020 23:26   DG Chest Portable 1 View  Result Date: 09/26/2020 CLINICAL DATA:  Cough and shortness of breath. EXAM: PORTABLE CHEST 1 VIEW COMPARISON:  12/04/2019 FINDINGS: Cardiomegaly which is increased from prior exam. Pulmonary edema, moderate in degree. There are bilateral pleural effusions. No confluent consolidation. No pneumothorax. No acute osseous abnormalities are seen. IMPRESSION: CHF with cardiomegaly, pulmonary edema and bilateral pleural effusions. Electronically Signed   By: Keith Rake M.D.   On: 09/26/2020 19:14    Lab Data:  CBC: Recent Labs  Lab 09/26/20 1901 09/26/20 2035 09/27/20 0432 09/28/20 0007  WBC 6.3  --  5.2 4.3  NEUTROABS 5.6  --  4.7 3.3  HGB 15.5 15.6 15.6 15.1  HCT 46.4 46.0 44.4 44.5  MCV 90.6  --  88.6 88.5  PLT  88*  --  90* 758*   Basic Metabolic Panel: Recent Labs  Lab 09/26/20 1901 09/26/20 2035 09/27/20 0432 09/28/20 0007  NA 134* 135 133* 133*  K 4.0 3.7 4.3 3.9  CL 98  --  101 99  CO2 24  --  21* 22  GLUCOSE 121*  --  174* 171*  BUN 25*  --  23* 25*  CREATININE 1.31*  --  1.11 0.95  CALCIUM 7.7*  --  7.7* 8.0*  MG  --   --  2.7* 2.8*  PHOS  --   --  3.1 2.9   GFR: Estimated Creatinine Clearance: 109.9 mL/min (by C-G formula based on SCr of 0.95 mg/dL). Liver Function Tests: Recent Labs  Lab 09/26/20 1901 09/27/20 0432 09/28/20 0007  AST 103* 176* 108*  ALT 64* 119* 114*  ALKPHOS 85 91 94  BILITOT 0.9 0.9 1.3*  PROT 6.6 6.9 7.0  ALBUMIN 2.9* 2.9* 2.8*   No results for input(s): LIPASE, AMYLASE in the last 168 hours. No results for input(s): AMMONIA in the last 168 hours. Coagulation Profile: No results for input(s): INR, PROTIME in the last 168 hours. Cardiac Enzymes: No results for input(s): CKTOTAL, CKMB, CKMBINDEX, TROPONINI in the last 168 hours. BNP (last 3 results) No results for input(s): PROBNP in the last 8760 hours. HbA1C: No results for input(s): HGBA1C in the last 72 hours. CBG: No results for input(s): GLUCAP in the last 168 hours. Lipid Profile: Recent Labs    09/27/20 0023  TRIG 197*   Thyroid Function Tests: No results for input(s): TSH, T4TOTAL, FREET4, T3FREE, THYROIDAB in the last 72 hours. Anemia Panel: Recent Labs    09/27/20 0432 09/28/20 0007  FERRITIN 3,855* 3,088*   Urine analysis: No results found for: COLORURINE, APPEARANCEUR, LABSPEC, PHURINE, GLUCOSEU, HGBUR, BILIRUBINUR, KETONESUR, PROTEINUR, UROBILINOGEN, NITRITE, LEUKOCYTESUR   Rashida Ladouceur M.D. [facility] 09/28/2020, 2:26 PM   Call night coverage person covering after 7pm

## 2020-09-29 ENCOUNTER — Encounter (HOSPITAL_COMMUNITY): Payer: Self-pay | Admitting: Internal Medicine

## 2020-09-29 DIAGNOSIS — U071 COVID-19: Principal | ICD-10-CM

## 2020-09-29 LAB — CBC WITH DIFFERENTIAL/PLATELET
Abs Immature Granulocytes: 0 10*3/uL (ref 0.00–0.07)
Basophils Absolute: 0 10*3/uL (ref 0.0–0.1)
Basophils Relative: 0 %
Eosinophils Absolute: 0 10*3/uL (ref 0.0–0.5)
Eosinophils Relative: 0 %
HCT: 44.4 % (ref 39.0–52.0)
Hemoglobin: 15 g/dL (ref 13.0–17.0)
Lymphocytes Relative: 2 %
Lymphs Abs: 0.2 10*3/uL — ABNORMAL LOW (ref 0.7–4.0)
MCH: 30.2 pg (ref 26.0–34.0)
MCHC: 33.8 g/dL (ref 30.0–36.0)
MCV: 89.5 fL (ref 80.0–100.0)
Monocytes Absolute: 0.3 10*3/uL (ref 0.1–1.0)
Monocytes Relative: 3 %
Neutro Abs: 9.2 10*3/uL — ABNORMAL HIGH (ref 1.7–7.7)
Neutrophils Relative %: 95 %
Platelets: 171 10*3/uL (ref 150–400)
RBC: 4.96 MIL/uL (ref 4.22–5.81)
RDW: 13.4 % (ref 11.5–15.5)
WBC: 9.7 10*3/uL (ref 4.0–10.5)
nRBC: 0 % (ref 0.0–0.2)
nRBC: 0 /100 WBC

## 2020-09-29 LAB — COMPREHENSIVE METABOLIC PANEL
ALT: 133 U/L — ABNORMAL HIGH (ref 0–44)
AST: 88 U/L — ABNORMAL HIGH (ref 15–41)
Albumin: 2.9 g/dL — ABNORMAL LOW (ref 3.5–5.0)
Alkaline Phosphatase: 109 U/L (ref 38–126)
Anion gap: 14 (ref 5–15)
BUN: 33 mg/dL — ABNORMAL HIGH (ref 6–20)
CO2: 24 mmol/L (ref 22–32)
Calcium: 8.4 mg/dL — ABNORMAL LOW (ref 8.9–10.3)
Chloride: 99 mmol/L (ref 98–111)
Creatinine, Ser: 1.13 mg/dL (ref 0.61–1.24)
GFR, Estimated: >60 mL/min (ref >60)
Glucose, Bld: 180 mg/dL — ABNORMAL HIGH (ref 70–99)
Potassium: 4.3 mmol/L (ref 3.5–5.1)
Sodium: 137 mmol/L (ref 135–145)
Total Bilirubin: 1 mg/dL (ref 0.3–1.2)
Total Protein: 6.8 g/dL (ref 6.5–8.1)

## 2020-09-29 LAB — C-REACTIVE PROTEIN: CRP: 3.6 mg/dL — ABNORMAL HIGH (ref <1.0)

## 2020-09-29 LAB — FERRITIN: Ferritin: 2958 ng/mL — ABNORMAL HIGH (ref 24–336)

## 2020-09-29 LAB — D-DIMER, QUANTITATIVE: D-Dimer, Quant: 1.49 ug/mL-FEU — ABNORMAL HIGH (ref 0.00–0.50)

## 2020-09-29 LAB — MAGNESIUM: Magnesium: 3 mg/dL — ABNORMAL HIGH (ref 1.7–2.4)

## 2020-09-29 LAB — PHOSPHORUS: Phosphorus: 3 mg/dL (ref 2.5–4.6)

## 2020-09-29 MED ORDER — PANTOPRAZOLE SODIUM 40 MG PO TBEC
40.0000 mg | DELAYED_RELEASE_TABLET | Freq: Every day | ORAL | Status: DC
  Administered 2020-09-29 – 2020-11-16 (×49): 40 mg via ORAL
  Filled 2020-09-29 (×49): qty 1

## 2020-09-29 NOTE — Progress Notes (Signed)
[facility]                                                                              Patient Demographics  Matthew [name], is a 54 y.o. male, DOB - 08-09-1966, GYB:[id number]  Admit date - 09/26/2020   Admitting Physician Marcelyn Bruins, MD  Outpatient Primary MD for the patient is Patient, No Pcp Per  Outpatient specialists:   LOS - 3  days   Medical records reviewed and are as summarized below:    No chief complaint on file.      Brief summary   Patient is a 54 year old male with history of CAD status post stent x2, tobacco use (78-pack-year history, quit in February, 2021),  HLP, GERD, presented with shortness of breath.  Patient had gradual worsening of shortness of breath since 12/9 and GI illness from around 12/10-12/14.  He was seen at the urgent care for diarrhea, nausea and vomiting.  Patient was treated with fluids and supportive care, had declined Covid test at that time.  As shortness of breath progressively worsened, patient called the EMS, was found to have O2 sats 78% on room air with significant rails.  Patient was placed on 15 L NRB with improvement to 90%.  Vaccination status: Patient is unvaccinated for COVID-19  In ED, patient was tachypneic with respiratory rate 20s to 30s, requiring 6 to 10 L O2 via .  Creatinine 1.3, AST 103, ALT 64.  Platelets 88. Covid test positive. Chest x-ray showed cardiomegaly with some pulmonary edema and effusions.  Patient was given a dose of Lasix in ED and was started on IV steroids and remdesivir  Subjective:   Matthew [name] was seen and examined today.  He reports generalized weakness, dyspnea and poor appetite .  Assessment & Plan    Principal Problem:  Acute hypoxic respiratory failure due to acute COVID-19 viral pneumonia during the ongoing COVID-19 pandemic- POA -He is unvaccinated - Patient presented with worsening shortness of breath, found to be hypoxic, chest x-ray showed  pulmonary edema with effusions, Covid test positive, unvaccinated.   - Continue IV Solu-Medrol, 60 mg every 12 hours, IV remdesivir, baricitinib - CT angiogram chest negative for PE - Continue Supportive care: vitamin C/zinc, albuterol, Tylenol, I-S, flutter valve, proning advised. - Continue to wean oxygen, ambulatory O2 screening daily as tolerated -Encouraged use incentive spirometry, flutter valve, to prone and out of bed to chair.  - Oxygen - SpO2: (!) 80 % O2 Flow Rate (L/min): 15 L/min HFNC - Continue to follow labs as below  Lab Results  Component Value Date   SARSCOV2NAA POSITIVE (A) 09/26/2020   SARSCOV2NAA NEGATIVE 02/06/2020   [place] NEGATIVE 11/17/2019     Recent Labs  Lab 09/26/20 1901 09/27/20 0023 09/27/20 0432 09/28/20 0007 09/29/20 0422  DDIMER  --  5.15* 4.30* 2.37* 1.49*  FERRITIN  --  3,737* 3,855* 3,088* 2,958*  CRP  --  16.8* 16.1* 11.9* 3.6*  ALT 64*  --  119* 114* 133*  PROCALCITON  --  0.65  --   --   --    Acute CHF with a preserved EF, with  cardiomegaly, pulmonary edema, bilateral pleural effusion -Received Lasix 60 mg IV x1 in ED, BNP 23.4, troponin 9 -2D echo with a preserved EF -Continue Lasix 40 mg daily x3, follow renal function, BP.   History of CAD status post stents x2 -Troponins negative, currently no chest pain, not on any home medications for this  Acute on chronic thrombocytopenia -Possibly worsened due to COVID-19, platelets 88K on admission, improving   GERD Continue PPI I  Mild acute kidney injury  -Likely due to nausea vomiting and diarrhea, poor oral intake -Baseline creatinine 1.0, presented with creatinine of 1.3 -Resolved, creatinine 0.95.   -Nausea vomiting resolved, tolerating diet.  History of hemoptysis complicated by epistaxis in 11/2019 -Follows Dr. Inez Catalina, was found to have clot in LUL bronchus   Obesity Estimated body mass index is 33.7 kg/m as calculated from the following:   Height as of  this encounter: 5\' 11"  (1.803 m).   Weight as of this encounter: 109.6 kg.  Code Status: Full CODE STATUS DVT Prophylaxis: Lovenox Family Communication: Discussed with patient's wife patient's wife, Matthew [name] [phone] (she is also covid positive) on  12/19.  Disposition Plan:     Status is: Inpatient  Remains inpatient appropriate because:Inpatient level of care appropriate due to severity of illness   Dispo: The patient is from: Home              Anticipated d/c is to: Home              Anticipated d/c date is: > 3 days              Patient currently is not medically stable to d/c.  Worsening hypoxia on 15 L O2 via NRB, COVID-19 active treatment  Procedures:  2D echo- results pending   Consultants:   None  Antimicrobials:   Anti-infectives (From admission, onward)   Start     Dose/Rate Route Frequency Ordered Stop   09/27/20 1000  remdesivir 100 mg in sodium chloride 0.9 % 100 mL IVPB       "Followed by" Linked Group Details   100 mg 200 mL/hr over 30 Minutes Intravenous Daily 09/26/20 2208 10/01/20 0959   09/26/20 2215  remdesivir 200 mg in sodium chloride 0.9% 250 mL IVPB       "Followed by" Linked Group Details   200 mg 580 mL/hr over 30 Minutes Intravenous Once 09/26/20 2208 09/27/20 0146         Medications  Scheduled Meds: . baricitinib  4 mg Oral Daily  . enoxaparin (LOVENOX) injection  55 mg Subcutaneous Daily  . melatonin  3 mg Oral QHS  . methylPREDNISolone (SOLU-MEDROL) injection  60 mg Intravenous BID  . sodium chloride flush  3 mL Intravenous Q12H   Continuous Infusions: . remdesivir 100 mg in NS 100 mL 100 mg (09/29/20 0959)   PRN Meds:.acetaminophen, ondansetron (ZOFRAN) IV, polyethylene glycol, sodium chloride        Objective:   Vitals:   09/29/20 0434 09/29/20 0438 09/29/20 0439 09/29/20 0740  BP:  110/75  115/71  Pulse:  71  80  Resp:  20  20  Temp:  97.9 F (36.6 C)  (!) 97.3 F (36.3 C)  TempSrc:  Oral  Oral   SpO2:  (!) 88%  (!) 80%  Weight: 109.1 kg  109.6 kg   Height:        Intake/Output Summary (Last 24 hours) at 09/29/2020 1121 Last data filed at 09/29/2020 0700 Gross per 24 hour  Intake 831 ml  Output 825 ml  Net 6 ml     Wt Readings from Last 3 Encounters:  09/29/20 109.6 kg  12/04/19 108.4 kg  11/20/19 107.9 kg    Physical Exam  Awake Alert, Oriented X 3, No new F.N deficits, Normal affect Symmetrical Chest wall movement, Good air movement bilaterally, CTAB RRR,No Gallops,Rubs or new Murmurs, No Parasternal Heave +ve B.Sounds, Abd Soft, No tenderness, No rebound - guarding or rigidity. No Cyanosis, Clubbing or edema, No new Rash or bruise      Data Reviewed:  I have personally reviewed following labs and imaging studies  Micro Results Recent Results (from the past 240 hour(s))  Resp Panel by RT-PCR (Flu A&B, Covid) Nasopharyngeal Swab     Status: Abnormal   Collection Time: 09/26/20  7:03 PM   Specimen: Nasopharyngeal Swab; Nasopharyngeal(NP) swabs in vial transport medium  Result Value Ref Range Status   SARS Coronavirus 2 by RT PCR POSITIVE (A) NEGATIVE Final    Comment: RESULT CALLED TO, READ BACK BY AND VERIFIED WITH: G TATE RN 09/26/20 2143 JDW (NOTE) SARS-CoV-2 target nucleic acids are DETECTED.  The SARS-CoV-2 RNA is generally detectable in upper respiratory specimens during the acute phase of infection. Positive results are indicative of the presence of the identified virus, but do not rule out bacterial infection or co-infection with other pathogens not detected by the test. Clinical correlation with patient history and other diagnostic information is necessary to determine patient infection status. The expected result is Negative.  Fact Sheet for Patients: EntrepreneurPulse.com.au  Fact Sheet for Healthcare Providers: IncredibleEmployment.be  This test is not yet approved or cleared by the [place] FDA  and  has been authorized for detection and/or diagnosis of SARS-CoV-2 by FDA under an Emergency Use Authorization (EUA).  This EUA will remain in effect (meaning this test can be used)  for the duration of  the COVID-19 declaration under Section 564(b)(1) of the Act, 21 U.S.C. section 360bbb-3(b)(1), unless the authorization is terminated or revoked sooner.     Influenza A by PCR NEGATIVE NEGATIVE Final   Influenza B by PCR NEGATIVE NEGATIVE Final    Comment: (NOTE) The Xpert Xpress SARS-CoV-2/FLU/RSV plus assay is intended as an aid in the diagnosis of influenza from Nasopharyngeal swab specimens and should not be used as a sole basis for treatment. Nasal washings and aspirates are unacceptable for Xpert Xpress SARS-CoV-2/FLU/RSV testing.  Fact Sheet for Patients: EntrepreneurPulse.com.au  Fact Sheet for Healthcare Providers: IncredibleEmployment.be  This test is not yet approved or cleared by the [place] FDA and has been authorized for detection and/or diagnosis of SARS-CoV-2 by FDA under an Emergency Use Authorization (EUA). This EUA will remain in effect (meaning this test can be used) for the duration of the COVID-19 declaration under Section 564(b)(1) of the Act, 21 U.S.C. section 360bbb-3(b)(1), unless the authorization is terminated or revoked.  Performed at [facility], [place] [address] [place], [place] [address]   Blood Culture (routine x 2)     Status: None (Preliminary result)   Collection Time: 09/27/20 12:31 AM   Specimen: BLOOD  Result Value Ref Range Status   Specimen Description BLOOD SITE NOT SPECIFIED  Final   Special Requests   Final    BOTTLES DRAWN AEROBIC AND ANAEROBIC Blood Culture adequate volume   Culture   Final    NO GROWTH 1 DAY Performed at [facility], [place] [address] [place], [place] [address]  Report Status PENDING  Incomplete  MRSA PCR Screening     Status: None   Collection Time:  09/28/20  4:14 AM   Specimen: Nasal Mucosa; Nasopharyngeal  Result Value Ref Range Status   MRSA by PCR NEGATIVE NEGATIVE Final    Comment:        The GeneXpert MRSA Assay (FDA approved for NASAL specimens only), is one component of a comprehensive MRSA colonization surveillance program. It is not intended to diagnose MRSA infection nor to guide or monitor treatment for MRSA infections. Performed at [facility], [place] [address], [place], [place] [address]     Radiology Reports CT ANGIO CHEST PE W OR WO CONTRAST  Result Date: 09/27/2020 CLINICAL DATA:  54 year old male with history of COVID-19, suspected pulmonary embolism. EXAM: CT ANGIOGRAPHY CHEST WITH CONTRAST TECHNIQUE: Multidetector CT imaging of the chest was performed using the standard protocol during bolus administration of intravenous contrast. Multiplanar CT image reconstructions and MIPs were obtained to evaluate the vascular anatomy. CONTRAST:  100 mL Omnipaque 350, intravenous COMPARISON:  Chest CT from 11/17/2019 FINDINGS: Cardiovascular: Satisfactory opacification of the pulmonary arteries to the segmental level. No evidence of pulmonary embolism. Normal heart size. No pericardial effusion. The heart is normal in size. No pericardial effusion. Severe coronary atherosclerotic calcifications. Mediastinum/Nodes: Similar appearing prominent right paratracheal lymph node measuring up to 1.2 cm in short axis. Otherwise no enlarged mediastinal, hilar, or axillary lymph nodes. Thyroid gland, trachea, and esophagus demonstrate no significant findings. Lungs/Pleura: Interval development of near diffuse, peripherally predominant ground-glass pulmonary opacities with a mid lung and basal predominance. Similar appearing moderate upper lobe predominant paraseptal and centrilobular emphysema. No pleural effusion or pneumothorax. Upper Abdomen: The visualized upper abdomen is within normal limits. Musculoskeletal: No chest wall  abnormality. No acute or significant osseous findings. Review of the MIP images confirms the above findings. IMPRESSION: Vascular: 1. No evidence of pulmonary embolism. 2. Severe coronary atherosclerotic calcifications. Non-Vascular: 1. Diffuse, mid lung and basilar, peripherally predominant ground-glass opacities - findings compatible with multifocal pneumonia associated with COVID-19. 2. Similar appearing moderate upper lobe predominant centrilobular and paraseptal emphysema. Electronically Signed   By: Ruthann Cancer MD   On: 09/27/2020 14:31   DG CHEST PORT 1 VIEW  Result Date: 09/26/2020 CLINICAL DATA:  COVID-19 EXAM: PORTABLE CHEST 1 VIEW COMPARISON:  09/26/2020 FINDINGS: Slight worsening of bilateral basilar predominant opacities. No pleural effusion or pneumothorax. Normal cardiomediastinal contours. IMPRESSION: Slight worsening of bilateral basilar predominant opacities. Electronically Signed   By: Ulyses Jarred M.D.   On: 09/26/2020 23:26   DG Chest Portable 1 View  Result Date: 09/26/2020 CLINICAL DATA:  Cough and shortness of breath. EXAM: PORTABLE CHEST 1 VIEW COMPARISON:  12/04/2019 FINDINGS: Cardiomegaly which is increased from prior exam. Pulmonary edema, moderate in degree. There are bilateral pleural effusions. No confluent consolidation. No pneumothorax. No acute osseous abnormalities are seen. IMPRESSION: CHF with cardiomegaly, pulmonary edema and bilateral pleural effusions. Electronically Signed   By: Keith Rake M.D.   On: 09/26/2020 19:14   ECHOCARDIOGRAM LIMITED  Result Date: 09/28/2020    ECHOCARDIOGRAM LIMITED REPORT   Patient Name:   Karanveer Loletha Grayer Albrecht Date of Exam: 09/28/2020 Medical Rec #:  [id number]          Height:       71.0 in Accession #:    [id number]         Weight:       244.3 lb Date of Birth:  [birth year]  BSA:          2.296 m Patient Age:    50 years           BP:           120/84 mmHg Patient Gender: M                  HR:           73 bpm. Exam  Location:  Inpatient Procedure: Limited Echo, Limited Color Doppler and Cardiac Doppler Indications:    acute respiratory distress  History:        Patient has prior history of Echocardiogram examinations, most                 recent 03/30/2016. CAD; Covid.  Sonographer:    Johny Chess Referring Phys: [id number] Candace Gallus MELVIN  Sonographer Comments: Image acquisition challenging due to respiratory motion. IMPRESSIONS  1. Left ventricular ejection fraction, by estimation, is 60 to 65%. The left ventricle has normal function. The left ventricle has no regional wall motion abnormalities. There is mild left ventricular hypertrophy.  2. Right ventricular systolic function is normal. The right ventricular size is normal.  3. The mitral valve is normal in structure. No evidence of mitral valve regurgitation. No evidence of mitral stenosis.  4. The aortic valve is tricuspid.  5. The inferior vena cava is normal in size with greater than 50% respiratory variability, suggesting right atrial pressure of 3 mmHg. FINDINGS  Left Ventricle: Left ventricular ejection fraction, by estimation, is 60 to 65%. The left ventricle has normal function. The left ventricle has no regional wall motion abnormalities. The left ventricular internal cavity size was normal in size. There is  mild left ventricular hypertrophy. Right Ventricle: The right ventricular size is normal. No increase in right ventricular wall thickness. Right ventricular systolic function is normal. Pericardium: There is no evidence of pericardial effusion. Mitral Valve: The mitral valve is normal in structure. No evidence of mitral valve stenosis. Tricuspid Valve: The tricuspid valve is normal in structure. Tricuspid valve regurgitation is not demonstrated. No evidence of tricuspid stenosis. Aortic Valve: The aortic valve is tricuspid. Pulmonic Valve: The pulmonic valve was not well visualized. Pulmonic valve regurgitation is not visualized. No evidence of pulmonic  stenosis. Aorta: The aortic root is normal in size and structure. Pulmonary Artery: Indeterminant PASP, inadequate TR jet. Venous: The inferior vena cava is normal in size with greater than 50% respiratory variability, suggesting right atrial pressure of 3 mmHg. IAS/Shunts: No atrial level shunt detected by color flow Doppler. LEFT VENTRICLE PLAX 2D LVIDd:         4.20 cm LVIDs:         2.80 cm LV PW:         1.30 cm LV IVS:        1.10 cm LVOT diam:     2.00 cm LV SV:         71 LV SV Index:   31 LVOT Area:     3.14 cm  IVC IVC diam: 1.70 cm LEFT ATRIUM         Index LA diam:    4.30 cm 1.87 cm/m  AORTIC VALVE LVOT Vmax:   116.00 cm/s LVOT Vmean:  76.100 cm/s LVOT VTI:    0.227 m  AORTA Ao Root diam: 3.10 cm MITRAL VALVE MV Area (PHT): 2.91 cm    SHUNTS MV Decel Time: 261 msec    Systemic VTI:  0.23  m MV E velocity: 75.40 cm/s  Systemic Diam: 2.00 cm MV A velocity: 66.00 cm/s MV E/A ratio:  1.14 Carlyle Dolly MD Electronically signed by Carlyle Dolly MD Signature Date/Time: 09/28/2020/2:52:33 PM    Final     Lab Data:  CBC: Recent Labs  Lab 09/26/20 1901 09/26/20 2035 09/27/20 0432 09/28/20 0007 09/29/20 0422  WBC 6.3  --  5.2 4.3 9.7  NEUTROABS 5.6  --  4.7 3.3 9.2*  HGB 15.5 15.6 15.6 15.1 15.0  HCT 46.4 46.0 44.4 44.5 44.4  MCV 90.6  --  88.6 88.5 89.5  PLT 88*  --  90* 115* 710   Basic Metabolic Panel: Recent Labs  Lab 09/26/20 1901 09/26/20 2035 09/27/20 0432 09/28/20 0007 09/29/20 0422  NA 134* 135 133* 133* 137  K 4.0 3.7 4.3 3.9 4.3  CL 98  --  101 99 99  CO2 24  --  21* 22 24  GLUCOSE 121*  --  174* 171* 180*  BUN 25*  --  23* 25* 33*  CREATININE 1.31*  --  1.11 0.95 1.13  CALCIUM 7.7*  --  7.7* 8.0* 8.4*  MG  --   --  2.7* 2.8* 3.0*  PHOS  --   --  3.1 2.9 3.0   GFR: Estimated Creatinine Clearance: 91.9 mL/min (by C-G formula based on SCr of 1.13 mg/dL). Liver Function Tests: Recent Labs  Lab 09/26/20 1901 09/27/20 0432 09/28/20 0007 09/29/20 0422   AST 103* 176* 108* 88*  ALT 64* 119* 114* 133*  ALKPHOS 85 91 94 109  BILITOT 0.9 0.9 1.3* 1.0  PROT 6.6 6.9 7.0 6.8  ALBUMIN 2.9* 2.9* 2.8* 2.9*   No results for input(s): LIPASE, AMYLASE in the last 168 hours. No results for input(s): AMMONIA in the last 168 hours. Coagulation Profile: No results for input(s): INR, PROTIME in the last 168 hours. Cardiac Enzymes: No results for input(s): CKTOTAL, CKMB, CKMBINDEX, TROPONINI in the last 168 hours. BNP (last 3 results) No results for input(s): PROBNP in the last 8760 hours. HbA1C: No results for input(s): HGBA1C in the last 72 hours. CBG: No results for input(s): GLUCAP in the last 168 hours. Lipid Profile: Recent Labs    09/27/20 0023  TRIG 197*   Thyroid Function Tests: No results for input(s): TSH, T4TOTAL, FREET4, T3FREE, THYROIDAB in the last 72 hours. Anemia Panel: Recent Labs    09/28/20 0007 09/29/20 0422  FERRITIN 3,088* 2,958*   Urine analysis: No results found for: COLORURINE, APPEARANCEUR, LABSPEC, PHURINE, GLUCOSEU, HGBUR, BILIRUBINUR, KETONESUR, PROTEINUR, UROBILINOGEN, NITRITE, Louie [place] Germaine Ripp M.D. [facility] 09/29/2020, 11:21 AM   Call night coverage person covering after 7pm

## 2020-09-30 ENCOUNTER — Encounter (HOSPITAL_COMMUNITY): Payer: Self-pay | Admitting: Internal Medicine

## 2020-09-30 LAB — CBC WITH DIFFERENTIAL/PLATELET
Abs Immature Granulocytes: 0.18 10*3/uL — ABNORMAL HIGH (ref 0.00–0.07)
Basophils Absolute: 0 10*3/uL (ref 0.0–0.1)
Basophils Relative: 0 %
Eosinophils Absolute: 0 10*3/uL (ref 0.0–0.5)
Eosinophils Relative: 0 %
HCT: 42.5 % (ref 39.0–52.0)
Hemoglobin: 14.3 g/dL (ref 13.0–17.0)
Immature Granulocytes: 2 %
Lymphocytes Relative: 7 %
Lymphs Abs: 0.8 10*3/uL (ref 0.7–4.0)
MCH: 30.4 pg (ref 26.0–34.0)
MCHC: 33.6 g/dL (ref 30.0–36.0)
MCV: 90.4 fL (ref 80.0–100.0)
Monocytes Absolute: 0.5 10*3/uL (ref 0.1–1.0)
Monocytes Relative: 5 %
Neutro Abs: 9.2 10*3/uL — ABNORMAL HIGH (ref 1.7–7.7)
Neutrophils Relative %: 86 %
Platelets: 184 10*3/uL (ref 150–400)
RBC: 4.7 MIL/uL (ref 4.22–5.81)
RDW: 13.3 % (ref 11.5–15.5)
WBC: 10.7 10*3/uL — ABNORMAL HIGH (ref 4.0–10.5)
nRBC: 0 % (ref 0.0–0.2)

## 2020-09-30 LAB — FERRITIN: Ferritin: 2100 ng/mL — ABNORMAL HIGH (ref 24–336)

## 2020-09-30 LAB — COMPREHENSIVE METABOLIC PANEL
ALT: 132 U/L — ABNORMAL HIGH (ref 0–44)
AST: 68 U/L — ABNORMAL HIGH (ref 15–41)
Albumin: 2.8 g/dL — ABNORMAL LOW (ref 3.5–5.0)
Alkaline Phosphatase: 106 U/L (ref 38–126)
Anion gap: 10 (ref 5–15)
BUN: 33 mg/dL — ABNORMAL HIGH (ref 6–20)
CO2: 24 mmol/L (ref 22–32)
Calcium: 8.2 mg/dL — ABNORMAL LOW (ref 8.9–10.3)
Chloride: 101 mmol/L (ref 98–111)
Creatinine, Ser: 1.08 mg/dL (ref 0.61–1.24)
GFR, Estimated: >60 mL/min (ref >60)
Glucose, Bld: 185 mg/dL — ABNORMAL HIGH (ref 70–99)
Potassium: 4.4 mmol/L (ref 3.5–5.1)
Sodium: 135 mmol/L (ref 135–145)
Total Bilirubin: 1.1 mg/dL (ref 0.3–1.2)
Total Protein: 6.5 g/dL (ref 6.5–8.1)

## 2020-09-30 LAB — D-DIMER, QUANTITATIVE: D-Dimer, Quant: 1.44 ug/mL-FEU — ABNORMAL HIGH (ref 0.00–0.50)

## 2020-09-30 LAB — C-REACTIVE PROTEIN: CRP: 1.4 mg/dL — ABNORMAL HIGH (ref <1.0)

## 2020-09-30 LAB — MAGNESIUM: Magnesium: 2.8 mg/dL — ABNORMAL HIGH (ref 1.7–2.4)

## 2020-09-30 LAB — PHOSPHORUS: Phosphorus: 3.5 mg/dL (ref 2.5–4.6)

## 2020-09-30 NOTE — Progress Notes (Signed)
[facility]                                                                              Patient Demographics  Matthew [name], is a 54 y.o. male, DOB - 25-Mar-1966, ZDG:[id number]  Admit date - 09/26/2020   Admitting Physician Marcelyn Bruins, MD  Outpatient Primary MD for the patient is Patient, No Pcp Per  Outpatient specialists:   LOS - 4  days   Medical records reviewed and are as summarized below:    No chief complaint on file.      Brief summary   Patient is a 54 year old male with history of CAD status post stent x2, tobacco use (78-pack-year history, quit in February, 2021),  HLP, GERD, presented with shortness of breath.  Patient had gradual worsening of shortness of breath since 12/9 and GI illness from around 12/10-12/14.  He was seen at the urgent care for diarrhea, nausea and vomiting.  Patient was treated with fluids and supportive care, had declined Covid test at that time.  As shortness of breath progressively worsened, patient called the EMS, was found to have O2 sats 78% on room air with significant rails.  Patient was placed on 15 L NRB with improvement to 90%.  Vaccination status: Patient is unvaccinated for COVID-19  In ED, patient was tachypneic with respiratory rate 20s to 30s, requiring 6 to 10 L O2 via [place].  Creatinine 1.3, AST 103, ALT 64.  Platelets 88. Covid test positive. Chest x-ray showed cardiomegaly with some pulmonary edema and effusions.  Patient was given a dose of Lasix in ED and was started on IV steroids and remdesivir  Subjective:   Matthew [name] was seen and examined today.  reports dyspnea at baseline, he does report some cough.    Assessment & Plan     Acute hypoxic respiratory failure due to acute COVID-19 viral pneumonia during the ongoing COVID-19 pandemic- POA -He is unvaccinated - Patient presented with worsening shortness of breath, found to be hypoxic, chest x-ray showed pulmonary edema with effusions,  Covid test positive, unvaccinated.   - Continue IV Solu-Medrol, 60 mg every 12 hours. -Continue with IV remdesivir . -Continue with baricitinib  - CT angiogram chest negative for PE - Continue Supportive care: vitamin C/zinc, albuterol, Tylenol, I-S, flutter valve, proning advised. - Continue to wean oxygen, ambulatory O2 screening daily as tolerated -Encouraged use incentive spirometry, flutter valve, to prone and out of bed to chair.  - Oxygen - SpO2: 95 % O2 Flow Rate (L/min): 13 L/min HFNC - Continue to follow labs as below  Lab Results  Component Value Date   SARSCOV2NAA POSITIVE (A) 09/26/2020   [place] NEGATIVE 02/06/2020   [place] NEGATIVE 11/17/2019     Recent Labs  Lab 09/26/20 1901 09/27/20 0023 09/27/20 0432 09/28/20 0007 09/29/20 0422 09/30/20 0430  DDIMER  --  5.15* 4.30* 2.37* 1.49* 1.44*  FERRITIN  --  3,737* 3,855* 3,088* 2,958* 2,100*  CRP  --  16.8* 16.1* 11.9* 3.6* 1.4*  ALT 64*  --  119* 114* 133* 132*  PROCALCITON  --  0.65  --   --   --   --  Acute CHF with a preserved EF, with cardiomegaly, pulmonary edema, bilateral pleural effusion -Received Lasix 60 mg IV x1 in ED, BNP 23.4, troponin 9 -2D echo with a preserved EF -Treated with Lasix, volume status appears to be euvolemic currently, will keep on Lasix as needed only.  History of CAD status post stents x2 -Troponins negative, currently no chest pain, not on any home medications for this  Acute on chronic thrombocytopenia -Possibly worsened due to COVID-19, platelets 88K on admission, improving  GERD Continue PPI I  Mild acute kidney injury  -Likely due to nausea vomiting and diarrhea, poor oral intake -Baseline creatinine 1.0, presented with creatinine of 1.3 -Resolved, creatinine 0.95.   -Nausea vomiting resolved, tolerating diet.  History of hemoptysis complicated by epistaxis in 11/2019 -Follows Dr. 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   Weight as of this encounter: 110 kg.  Code Status: Full CODE STATUS DVT Prophylaxis: Lovenox Family Communication: Discussed with patient's wife patient's wife, Leeon Makar [phone] (she is also covid positive) on  12/19.12/20  Disposition Plan:     Status is: Inpatient  Remains inpatient appropriate because:Inpatient level of care appropriate due to severity of illness   Dispo: The patient is from: Home              Anticipated d/c is to: Home              Anticipated d/c date is: > 3 days              Patient currently is not medically stable to d/c.  Worsening hypoxia on 15 L O2 via NRB, COVID-19 active treatment  Procedures:  2D echo- results pending   Consultants:   None  Antimicrobials:   Anti-infectives (From admission, onward)   Start     Dose/Rate Route Frequency Ordered Stop   09/27/20 1000  remdesivir 100 mg in sodium chloride 0.9 % 100 mL IVPB       "Followed by" Linked Group Details   100 mg 200 mL/hr over 30 Minutes Intravenous Daily 09/26/20 2208 09/30/20 0949   09/26/20 2215  remdesivir 200 mg in sodium chloride 0.9% 250 mL IVPB       "Followed by" Linked Group Details   200 mg 580 mL/hr over 30 Minutes Intravenous Once 09/26/20 2208 09/27/20 0146         Medications  Scheduled Meds: . baricitinib  4 mg Oral Daily  . enoxaparin (LOVENOX) injection  55 mg Subcutaneous Daily  . melatonin  3 mg Oral QHS  . methylPREDNISolone (SOLU-MEDROL) injection  60 mg Intravenous BID  . pantoprazole  40 mg Oral Daily  . sodium chloride flush  3 mL Intravenous Q12H   Continuous Infusions:  PRN Meds:.acetaminophen, ondansetron (ZOFRAN) IV, polyethylene glycol, sodium chloride        Objective:   Vitals:   09/29/20 1957 09/29/20 2357 09/30/20 0353 09/30/20 0719  BP: 112/75 112/67 115/68 107/73   Pulse: 82 64 60 71  Resp: (!) 21 (!) 21 (!) 21 20  Temp: (!) 97.5 F (36.4 C) (!) 97.5 F (36.4 C) (!) 97.4 F (36.3 C) 97.6 F (36.4 C)  TempSrc: Oral Oral Oral Oral  SpO2: 91% 90% 94% 95%  Weight: 110 kg  110 kg   Height:        Intake/Output Summary (  Last 24 hours) at 09/30/2020 0959 Last data filed at 09/29/2020 1957 Gross per 24 hour  Intake --  Output 900 ml  Net -900 ml     Wt Readings from Last 3 Encounters:  09/30/20 110 kg  12/04/19 108.4 kg  11/20/19 107.9 kg    Physical Exam  Awake Alert, Oriented X 3, No new F.N deficits, Normal affect Symmetrical Chest wall movement, Good air movement bilaterally, CTAB RRR,No Gallops,Rubs or new Murmurs, No Parasternal Heave +ve B.Sounds, Abd Soft, No tenderness, No rebound - guarding or rigidity. No Cyanosis, Clubbing or edema, No new Rash or bruise      Data Reviewed:  I have personally reviewed following labs and imaging studies  Micro Results Recent Results (from the past 240 hour(s))  Resp Panel by RT-PCR (Flu A&B, Covid) Nasopharyngeal Swab     Status: Abnormal   Collection Time: 09/26/20  7:03 PM   Specimen: Nasopharyngeal Swab; Nasopharyngeal(NP) swabs in vial transport medium  Result Value Ref Range Status   SARS Coronavirus 2 by RT PCR POSITIVE (A) NEGATIVE Final    Comment: RESULT CALLED TO, Matthew BACK BY AND VERIFIED WITH: G TATE RN 09/26/20 2143 JDW (NOTE) SARS-CoV-2 target nucleic acids are DETECTED.  The SARS-CoV-2 RNA is generally detectable in upper respiratory specimens during the acute phase of infection. Positive results are indicative of the presence of the identified virus, but do not rule out bacterial infection or co-infection with other pathogens not detected by the test. Clinical correlation with patient history and other diagnostic information is necessary to determine patient infection status. The expected result is Negative.  Fact Sheet for  Patients: EntrepreneurPulse.com.au  Fact Sheet for Healthcare Providers: IncredibleEmployment.be  This test is not yet approved or cleared by the [place] FDA and  has been authorized for detection and/or diagnosis of SARS-CoV-2 by FDA under an Emergency Use Authorization (EUA).  This EUA will remain in effect (meaning this test can be used)  for the duration of  the COVID-19 declaration under Section 564(b)(1) of the Act, 21 U.S.C. section 360bbb-3(b)(1), unless the authorization is terminated or revoked sooner.     Influenza A by PCR NEGATIVE NEGATIVE Final   Influenza B by PCR NEGATIVE NEGATIVE Final    Comment: (NOTE) The Xpert Xpress SARS-CoV-2/FLU/RSV plus assay is intended as an aid in the diagnosis of influenza from Nasopharyngeal swab specimens and should not be used as a sole basis for treatment. Nasal washings and aspirates are unacceptable for Xpert Xpress SARS-CoV-2/FLU/RSV testing.  Fact Sheet for Patients: EntrepreneurPulse.com.au  Fact Sheet for Healthcare Providers: IncredibleEmployment.be  This test is not yet approved or cleared by the [place] FDA and has been authorized for detection and/or diagnosis of SARS-CoV-2 by FDA under an Emergency Use Authorization (EUA). This EUA will remain in effect (meaning this test can be used) for the duration of the COVID-19 declaration under Section 564(b)(1) of the Act, 21 U.S.C. section 360bbb-3(b)(1), unless the authorization is terminated or revoked.  Performed at [facility], [place] [address], [place], [place] [address]   Blood Culture (routine x 2)     Status: None (Preliminary result)   Collection Time: 09/27/20 12:31 AM   Specimen: BLOOD  Result Value Ref Range Status   Specimen Description BLOOD SITE NOT SPECIFIED  Final   Special Requests   Final    BOTTLES DRAWN AEROBIC AND ANAEROBIC Blood Culture adequate volume    Culture   Final    NO GROWTH 3  DAYS Performed at [facility], [place] [address], [place], [place] [address]    Report Status PENDING  Incomplete  MRSA PCR Screening     Status: None   Collection Time: 09/28/20  4:14 AM   Specimen: Nasal Mucosa; Nasopharyngeal  Result Value Ref Range Status   MRSA by PCR NEGATIVE NEGATIVE Final    Comment:        The GeneXpert MRSA Assay (FDA approved for NASAL specimens only), is one component of a comprehensive MRSA colonization surveillance program. It is not intended to diagnose MRSA infection nor to guide or monitor treatment for MRSA infections. Performed at [facility], [place] [address], [place], [place] [address]     Radiology Reports CT ANGIO CHEST PE W OR WO CONTRAST  Result Date: 09/27/2020 CLINICAL DATA:  54 year old male with history of COVID-19, suspected pulmonary embolism. EXAM: CT ANGIOGRAPHY CHEST WITH CONTRAST TECHNIQUE: Multidetector CT imaging of the chest was performed using the standard protocol during bolus administration of intravenous contrast. Multiplanar CT image reconstructions and MIPs were obtained to evaluate the vascular anatomy. CONTRAST:  100 mL Omnipaque 350, intravenous COMPARISON:  Chest CT from 11/17/2019 FINDINGS: Cardiovascular: Satisfactory opacification of the pulmonary arteries to the segmental level. No evidence of pulmonary embolism. Normal heart size. No pericardial effusion. The heart is normal in size. No pericardial effusion. Severe coronary atherosclerotic calcifications. Mediastinum/Nodes: Similar appearing prominent right paratracheal lymph node measuring up to 1.2 cm in short axis. Otherwise no enlarged mediastinal, hilar, or axillary lymph nodes. Thyroid gland, trachea, and esophagus demonstrate no significant findings. Lungs/Pleura: Interval development of near diffuse, peripherally predominant ground-glass pulmonary opacities with a mid lung and basal predominance. Similar appearing moderate  upper lobe predominant paraseptal and centrilobular emphysema. No pleural effusion or pneumothorax. Upper Abdomen: The visualized upper abdomen is within normal limits. Musculoskeletal: No chest wall abnormality. No acute or significant osseous findings. Review of the MIP images confirms the above findings. IMPRESSION: Vascular: 1. No evidence of pulmonary embolism. 2. Severe coronary atherosclerotic calcifications. Non-Vascular: 1. Diffuse, mid lung and basilar, peripherally predominant ground-glass opacities - findings compatible with multifocal pneumonia associated with COVID-19. 2. Similar appearing moderate upper lobe predominant centrilobular and paraseptal emphysema. Electronically Signed   By: Ruthann Cancer MD   On: 09/27/2020 14:31   DG CHEST PORT 1 VIEW  Result Date: 09/26/2020 CLINICAL DATA:  COVID-19 EXAM: PORTABLE CHEST 1 VIEW COMPARISON:  09/26/2020 FINDINGS: Slight worsening of bilateral basilar predominant opacities. No pleural effusion or pneumothorax. Normal cardiomediastinal contours. IMPRESSION: Slight worsening of bilateral basilar predominant opacities. Electronically Signed   By: Ulyses Jarred M.D.   On: 09/26/2020 23:26   DG Chest Portable 1 View  Result Date: 09/26/2020 CLINICAL DATA:  Cough and shortness of breath. EXAM: PORTABLE CHEST 1 VIEW COMPARISON:  12/04/2019 FINDINGS: Cardiomegaly which is increased from prior exam. Pulmonary edema, moderate in degree. There are bilateral pleural effusions. No confluent consolidation. No pneumothorax. No acute osseous abnormalities are seen. IMPRESSION: CHF with cardiomegaly, pulmonary edema and bilateral pleural effusions. Electronically Signed   By: Keith Rake M.D.   On: 09/26/2020 19:14   ECHOCARDIOGRAM LIMITED  Result Date: 09/28/2020    ECHOCARDIOGRAM LIMITED REPORT   Patient Name:   Matthew [name] Date of Exam: 09/28/2020 Medical Rec #:  [id number]          Height:       71.0 in Accession #:    [id number]  Weight:       244.3 lb Date of Birth:  09/19/1966          BSA:          2.296 m Patient Age:    64 years           BP:           120/84 mmHg Patient Gender: M                  HR:           73 bpm. Exam Location:  Inpatient Procedure: Limited Echo, Limited Color Doppler and Cardiac Doppler Indications:    acute respiratory distress  History:        Patient has prior history of Echocardiogram examinations, most                 recent 03/30/2016. CAD; Covid.  Sonographer:    Johny Chess Referring Phys: [id number] Candace Gallus MELVIN  Sonographer Comments: Image acquisition challenging due to respiratory motion. IMPRESSIONS  1. Left ventricular ejection fraction, by estimation, is 60 to 65%. The left ventricle has normal function. The left ventricle has no regional wall motion abnormalities. There is mild left ventricular hypertrophy.  2. Right ventricular systolic function is normal. The right ventricular size is normal.  3. The mitral valve is normal in structure. No evidence of mitral valve regurgitation. No evidence of mitral stenosis.  4. The aortic valve is tricuspid.  5. The inferior vena cava is normal in size with greater than 50% respiratory variability, suggesting right atrial pressure of 3 mmHg. FINDINGS  Left Ventricle: Left ventricular ejection fraction, by estimation, is 60 to 65%. The left ventricle has normal function. The left ventricle has no regional wall motion abnormalities. The left ventricular internal cavity size was normal in size. There is  mild left ventricular hypertrophy. Right Ventricle: The right ventricular size is normal. No increase in right ventricular wall thickness. Right ventricular systolic function is normal. Pericardium: There is no evidence of pericardial effusion. Mitral Valve: The mitral valve is normal in structure. No evidence of mitral valve stenosis. Tricuspid Valve: The tricuspid valve is normal in structure. Tricuspid valve regurgitation is not demonstrated. No  evidence of tricuspid stenosis. Aortic Valve: The aortic valve is tricuspid. Pulmonic Valve: The pulmonic valve was not well visualized. Pulmonic valve regurgitation is not visualized. No evidence of pulmonic stenosis. Aorta: The aortic root is normal in size and structure. Pulmonary Artery: Indeterminant PASP, inadequate TR jet. Venous: The inferior vena cava is normal in size with greater than 50% respiratory variability, suggesting right atrial pressure of 3 mmHg. IAS/Shunts: No atrial level shunt detected by color flow Doppler. LEFT VENTRICLE PLAX 2D LVIDd:         4.20 cm LVIDs:         2.80 cm LV PW:         1.30 cm LV IVS:        1.10 cm LVOT diam:     2.00 cm LV SV:         71 LV SV Index:   31 LVOT Area:     3.14 cm  IVC IVC diam: 1.70 cm LEFT ATRIUM         Index LA diam:    4.30 cm 1.87 cm/m  AORTIC VALVE LVOT Vmax:   116.00 cm/s LVOT Vmean:  76.100 cm/s LVOT VTI:    0.227 m  AORTA Ao Root diam: 3.10 cm MITRAL  VALVE MV Area (PHT): 2.91 cm    SHUNTS MV Decel Time: 261 msec    Systemic VTI:  0.23 m MV E velocity: 75.40 cm/s  Systemic Diam: 2.00 cm MV A velocity: 66.00 cm/s MV E/A ratio:  1.14 Carlyle Dolly MD Electronically signed by Carlyle Dolly MD Signature Date/Time: 09/28/2020/2:52:33 PM    Final     Lab Data:  CBC: Recent Labs  Lab 09/26/20 1901 09/26/20 2035 09/27/20 0432 09/28/20 0007 09/29/20 0422 09/30/20 0430  WBC 6.3  --  5.2 4.3 9.7 10.7*  NEUTROABS 5.6  --  4.7 3.3 9.2* 9.2*  HGB 15.5 15.6 15.6 15.1 15.0 14.3  HCT 46.4 46.0 44.4 44.5 44.4 42.5  MCV 90.6  --  88.6 88.5 89.5 90.4  PLT 88*  --  90* 115* 171 175   Basic Metabolic Panel: Recent Labs  Lab 09/26/20 1901 09/26/20 2035 09/27/20 0432 09/28/20 0007 09/29/20 0422 09/30/20 0430  NA 134* 135 133* 133* 137 135  K 4.0 3.7 4.3 3.9 4.3 4.4  CL 98  --  101 99 99 101  CO2 24  --  21* 22 24 24   GLUCOSE 121*  --  174* 171* 180* 185*  BUN 25*  --  23* 25* 33* 33*  CREATININE 1.31*  --  1.11 0.95 1.13 1.08   CALCIUM 7.7*  --  7.7* 8.0* 8.4* 8.2*  MG  --   --  2.7* 2.8* 3.0* 2.8*  PHOS  --   --  3.1 2.9 3.0 3.5   GFR: Estimated Creatinine Clearance: 96.4 mL/min (by C-G formula based on SCr of 1.08 mg/dL). Liver Function Tests: Recent Labs  Lab 09/26/20 1901 09/27/20 0432 09/28/20 0007 09/29/20 0422 09/30/20 0430  AST 103* 176* 108* 88* 68*  ALT 64* 119* 114* 133* 132*  ALKPHOS 85 91 94 109 106  BILITOT 0.9 0.9 1.3* 1.0 1.1  PROT 6.6 6.9 7.0 6.8 6.5  ALBUMIN 2.9* 2.9* 2.8* 2.9* 2.8*   No results for input(s): LIPASE, AMYLASE in the last 168 hours. No results for input(s): AMMONIA in the last 168 hours. Coagulation Profile: No results for input(s): INR, PROTIME in the last 168 hours. Cardiac Enzymes: No results for input(s): CKTOTAL, CKMB, CKMBINDEX, TROPONINI in the last 168 hours. BNP (last 3 results) No results for input(s): PROBNP in the last 8760 hours. HbA1C: No results for input(s): HGBA1C in the last 72 hours. CBG: No results for input(s): GLUCAP in the last 168 hours. Lipid Profile: No results for input(s): CHOL, HDL, LDLCALC, TRIG, CHOLHDL, LDLDIRECT in the last 72 hours. Thyroid Function Tests: No results for input(s): TSH, T4TOTAL, FREET4, T3FREE, THYROIDAB in the last 72 hours. Anemia Panel: Recent Labs    09/29/20 0422 09/30/20 0430  FERRITIN 2,958* 2,100*   Urine analysis: No results found for: COLORURINE, APPEARANCEUR, LABSPEC, PHURINE, GLUCOSEU, HGBUR, BILIRUBINUR, KETONESUR, PROTEINUR, UROBILINOGEN, NITRITE, Louie Boston Antasia Haider M.D. [facility] 09/30/2020, 9:59 AM   Call night coverage person covering after 7pm

## 2020-10-01 LAB — CBC WITH DIFFERENTIAL/PLATELET
Abs Immature Granulocytes: 0.16 10*3/uL — ABNORMAL HIGH (ref 0.00–0.07)
Basophils Absolute: 0 10*3/uL (ref 0.0–0.1)
Basophils Relative: 0 %
Eosinophils Absolute: 0 10*3/uL (ref 0.0–0.5)
Eosinophils Relative: 0 %
HCT: 41.7 % (ref 39.0–52.0)
Hemoglobin: 14.6 g/dL (ref 13.0–17.0)
Immature Granulocytes: 1 %
Lymphocytes Relative: 5 %
Lymphs Abs: 0.6 10*3/uL — ABNORMAL LOW (ref 0.7–4.0)
MCH: 30.7 pg (ref 26.0–34.0)
MCHC: 35 g/dL (ref 30.0–36.0)
MCV: 87.8 fL (ref 80.0–100.0)
Monocytes Absolute: 0.5 10*3/uL (ref 0.1–1.0)
Monocytes Relative: 4 %
Neutro Abs: 11 10*3/uL — ABNORMAL HIGH (ref 1.7–7.7)
Neutrophils Relative %: 90 %
Platelets: 204 10*3/uL (ref 150–400)
RBC: 4.75 MIL/uL (ref 4.22–5.81)
RDW: 13.3 % (ref 11.5–15.5)
WBC: 12.2 10*3/uL — ABNORMAL HIGH (ref 4.0–10.5)
nRBC: 0 % (ref 0.0–0.2)

## 2020-10-01 LAB — COMPREHENSIVE METABOLIC PANEL
ALT: 131 U/L — ABNORMAL HIGH (ref 0–44)
AST: 51 U/L — ABNORMAL HIGH (ref 15–41)
Albumin: 2.9 g/dL — ABNORMAL LOW (ref 3.5–5.0)
Alkaline Phosphatase: 101 U/L (ref 38–126)
Anion gap: 10 (ref 5–15)
BUN: 28 mg/dL — ABNORMAL HIGH (ref 6–20)
CO2: 20 mmol/L — ABNORMAL LOW (ref 22–32)
Calcium: 8.3 mg/dL — ABNORMAL LOW (ref 8.9–10.3)
Chloride: 104 mmol/L (ref 98–111)
Creatinine, Ser: 1.03 mg/dL (ref 0.61–1.24)
GFR, Estimated: >60 mL/min (ref >60)
Glucose, Bld: 169 mg/dL — ABNORMAL HIGH (ref 70–99)
Potassium: 4.9 mmol/L (ref 3.5–5.1)
Sodium: 134 mmol/L — ABNORMAL LOW (ref 135–145)
Total Bilirubin: 1.5 mg/dL — ABNORMAL HIGH (ref 0.3–1.2)
Total Protein: 6.3 g/dL — ABNORMAL LOW (ref 6.5–8.1)

## 2020-10-01 LAB — D-DIMER, QUANTITATIVE: D-Dimer, Quant: 1.76 ug/mL-FEU — ABNORMAL HIGH (ref 0.00–0.50)

## 2020-10-01 LAB — MAGNESIUM: Magnesium: 2.8 mg/dL — ABNORMAL HIGH (ref 1.7–2.4)

## 2020-10-01 LAB — FERRITIN: Ferritin: 2102 ng/mL — ABNORMAL HIGH (ref 24–336)

## 2020-10-01 LAB — C-REACTIVE PROTEIN: CRP: 0.9 mg/dL (ref <1.0)

## 2020-10-01 LAB — PHOSPHORUS: Phosphorus: 2.7 mg/dL (ref 2.5–4.6)

## 2020-10-01 MED ORDER — ENSURE ENLIVE PO LIQD
237.0000 mL | Freq: Two times a day (BID) | ORAL | Status: DC
  Administered 2020-10-04 – 2020-10-18 (×9): 237 mL via ORAL

## 2020-10-01 MED ORDER — POLYETHYLENE GLYCOL 3350 17 G PO PACK
17.0000 g | PACK | Freq: Two times a day (BID) | ORAL | Status: AC
  Filled 2020-10-01: qty 1

## 2020-10-01 NOTE — Evaluation (Signed)
Physical Therapy Evaluation Patient Details Name: Matthew [name] MRN: [id number] DOB: 1966/09/12 Today's Date: 10/01/2020   History of Present Illness  Pt is a 54 y.o. male admitted 09/26/20 with SOB and GI distress (had declined COVID test when seen in urgent care). Pt now testing (+) COVID-19. Workup for acute hypoxic respiratory failure due to acute COVID-19 viral PNA. PMH includes CAD, CHF, tobacco use (quit 11/2019); pt unvaccinated.    Clinical Impression  Pt presents with an overall decrease in functional mobility secondary to above. 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 Initiated education re: current conditios, O2 needs, activity recommendations, positioning, therex, energy conservation, IS/flutter valve use and importance of mobility. Today, pt able to tolerate ~10-sec of standing activity before needing prolonged seated rest to recover. SpO2 down to 83% on 15 L O2 HFNC; returning to >/88% with seated rest and pursed lip breathing. Pt moving well, but limited by impaired cardiopulmonary status. Pt would benefit from continued acute PT services to maximize functional mobility and independence prior to d/c home.    Follow Up Recommendations No PT follow up (pending progression)    Equipment Recommendations  Other (comment) (shower seat)    Recommendations for Other Services       Precautions / Restrictions Precautions Precautions: Fall;Other (comment) Precaution Comments: Watch SpO2 Restrictions Weight Bearing Restrictions: No      Mobility  Bed Mobility Overal bed mobility: Modified Independent             General bed mobility comments: HOB elevated    Transfers Overall transfer level: Needs assistance Equipment used: None Transfers: Sit to/from Stand Sit to Stand: Supervision         General transfer comment: Supervision for safety with lines and SpO2 monitoring  Ambulation/Gait Ambulation/Gait assistance:  Supervision Gait Distance (Feet): 2 Feet Assistive device: None Gait Pattern/deviations: Step-through pattern;Trunk flexed     General Gait Details: Marching in place and steps/forwards backwards within confines of lines; pt tolerates ~10-sec standing activity before needing seated rest with SpO2 down to 83% on 15L O2 [place]  Stairs            Wheelchair Mobility    Modified Rankin (Stroke Patients Only)       Balance Overall balance assessment: Needs assistance   Sitting balance-Leahy Scale: Good       Standing balance-Leahy Scale: Fair                               Pertinent Vitals/Pain Pain Assessment: No/denies pain    Home Living Family/patient expects to be discharged to:: Private residence Living Arrangements: Spouse/significant other Available Help at Discharge: Family;Available 24 hours/day Type of Home: House Home Access: Stairs to enter Entrance Stairs-Rails: Right Entrance Stairs-Number of Steps: 3 Home Layout: One level Home Equipment: None Additional Comments: Lives with wife who is retired    Prior Function Level of Independence: Independent         Comments: Works in Electronics engineer        Extremity/Trunk Assessment   Upper Extremity Assessment Upper Extremity Assessment: Overall WFL for tasks assessed    Lower Extremity Assessment Lower Extremity Assessment: Overall WFL for tasks assessed       Communication   Communication: No difficulties  Cognition Arousal/Alertness: Awake/alert Behavior During Therapy: WFL for tasks assessed/performed Overall Cognitive Status: Within Functional Limits for tasks assessed  General Comments General comments (skin integrity, edema, etc.): pt tolerates ~10-sec standing activity before needing seated rest with SpO2 down to 83% on 15L O2 [place]; returning to >/88% with prolonged seated rest; educ on pursed lip  breathing    Exercises Other Exercises Other Exercises: Seated LAQ, seated marching, standing marching, repeated sit<>stands Other Exercises: 5x incentive spirometer (pt pulling ~1000 mL with correct technique), 5x flutter valve   Assessment/Plan    PT Assessment Patient needs continued PT services  PT Problem List Decreased activity tolerance;Decreased balance;Decreased mobility;Cardiopulmonary status limiting activity       PT Treatment Interventions DME instruction;Gait training;Stair training;Functional mobility training;Therapeutic activities;Therapeutic exercise;Balance training;Patient/family education    PT Goals (Current goals can be found in the Care Plan section)  Acute Rehab PT Goals Patient Stated Goal: "I want to get out of here as soon as I can"; get back to work driving trucks PT Goal Formulation: With patient Time For Goal Achievement: 10/15/20 Potential to Achieve Goals: Good    Frequency Min 3X/week   Barriers to discharge        Co-evaluation               AM-PAC PT "6 Clicks" Mobility  Outcome Measure Help needed turning from your back to your side while in a flat bed without using bedrails?: None Help needed moving from lying on your back to sitting on the side of a flat bed without using bedrails?: None Help needed moving to and from a bed to a chair (including a wheelchair)?: A Little Help needed standing up from a chair using your arms (e.g., wheelchair or bedside chair)?: A Little Help needed to walk in hospital room?: A Little Help needed climbing 3-5 steps with a railing? : A Little 6 Click Score: 20    End of Session Equipment Utilized During Treatment: Oxygen Activity Tolerance: Patient tolerated treatment well;Treatment limited secondary to medical complications (Comment) Patient left: in bed;with call bell/phone within reach (seated EOB)   PT Visit Diagnosis: Other abnormalities of gait and mobility (R26.89)    Time: 5320-2334 PT  Time Calculation (min) (ACUTE ONLY): 24 min   Charges:   PT Evaluation $PT Eval Moderate Complexity: 1 Mod PT Treatments $Therapeutic Exercise: 8-22 mins   Mabeline Caras, PT, DPT Acute Rehabilitation Services  Pager [phone] Office [place] 10/01/2020, 10:33 AM

## 2020-10-01 NOTE — Progress Notes (Signed)
[facility]                                                                              Patient Demographics  [name], is a 54 y.o. male, DOB - Feb 28, 1966, [id number]  Admit date - 09/26/2020   Admitting Physician Marcelyn Bruins, MD  Outpatient Primary MD for the patient is Patient, No Pcp Per  Outpatient specialists:   LOS - 5  days   Medical records reviewed and are as summarized below:    No chief complaint on file.      Brief summary   Patient is a 54 year old male with history of CAD status post stent x2, tobacco use (78-pack-year history, quit in February, 2021),  HLP, GERD, presented with shortness of breath.  Patient had gradual worsening of shortness of breath since 12/9 and GI illness from around 12/10-12/14.  He was seen at the urgent care for diarrhea, nausea and vomiting.  Patient was treated with fluids and supportive care, had declined Covid test at that time.  As shortness of breath progressively worsened, patient called the EMS, was found to have O2 sats 78% on room air with significant rails.  Patient was placed on 15 L NRB with improvement to 90%.  Vaccination status: Patient is unvaccinated for COVID-19  In ED, patient was tachypneic with respiratory rate 20s to 30s, requiring 6 to 10 L O2 via [place].  Creatinine 1.3, AST 103, ALT 64.  Platelets 88. Covid test positive. Chest x-ray showed cardiomegaly with some pulmonary edema and effusions.  Patient was given a dose of Lasix in ED and was started on IV steroids and remdesivir  Subjective:   Matthew [name] was seen and examined today.  He denies any new complaints, report dyspnea and cough with no significant change, denies any fever or chest pain.   Assessment & Plan   Acute hypoxic respiratory failure due to acute COVID-19 viral pneumonia during the ongoing COVID-19 pandemic- POA -He is unvaccinated - Patient presented with worsening shortness of breath, found to be  hypoxic, chest x-ray showed pulmonary edema with effusions, Covid test positive, unvaccinated.   - Continue IV Solu-Medrol, 60 mg every 12 hours. -Treated with IV remdesivir . -Continue with baricitinib  - CT angiogram chest negative for PE - Continue Supportive care: vitamin C/zinc, albuterol, Tylenol, I-S, flutter valve, proning advised. -Due to severe Covid, remains with significant oxygen requirement at 15 L high flow nasal cannula. -Encouraged use incentive spirometry, flutter valve, to prone and out of bed to chair.  - Oxygen - SpO2: (!) 88 % O2 Flow Rate (L/min): 15 L/min HFNC - Continue to follow labs as below  Lab Results  Component Value Date   SARSCOV2NAA POSITIVE (A) 09/26/2020   SARSCOV2NAA NEGATIVE 02/06/2020   [place] NEGATIVE 11/17/2019     Recent Labs  Lab 09/27/20 0023 09/27/20 0432 09/28/20 0007 09/29/20 0422 09/30/20 0430 10/01/20 0119  DDIMER 5.15* 4.30* 2.37* 1.49* 1.44* 1.76*  FERRITIN 3,737* 3,855* 3,088* 2,958* 2,100* 2,102*  CRP 16.8* 16.1* 11.9* 3.6* 1.4* 0.9  ALT  --  119* 114* 133* 132* 131*  PROCALCITON 0.65  --   --   --   --   --  Acute CHF with a preserved EF, with cardiomegaly, pulmonary edema, bilateral pleural effusion -Received Lasix 60 mg IV x1 in ED, BNP 23.4, troponin 9 -2D echo with a preserved EF -Treated with Lasix, volume status appears to be euvolemic currently, will keep on Lasix as needed only.  History of CAD status post stents x2 -Troponins negative, currently no chest pain, not on any home medications for this  Acute on chronic thrombocytopenia -Possibly worsened due to COVID-19, platelets 88K on admission, resolved  Transaminitis -Due to Covid, stable, monitor closely.  GERD Continue PPI I  Mild acute kidney injury  -Likely due to nausea vomiting and diarrhea, poor oral intake -Baseline creatinine 1.0, presented with creatinine of 1.3 -Resolved, creatinine 0.95.   -Nausea vomiting resolved, tolerating  diet.  History of hemoptysis complicated by epistaxis in 11/2019 -Follows Dr. Inez Catalina, was found to have clot in LUL bronchus   Obesity Estimated body mass index is 33.48 kg/m as calculated from the following:   Height as of this encounter: 5\' 11"  (1.803 m).   Weight as of this encounter: 108.9 kg.  Code Status: Full CODE STATUS DVT Prophylaxis: Lovenox Family Communication: Discussed with patient's wife patient's wife, [name] [phone] (she is also covid positive) on  12/19.12/20.12/21.  Disposition Plan:     Status is: Inpatient  Remains inpatient appropriate because:Inpatient level of care appropriate due to severity of illness   Dispo: The patient is from: Home              Anticipated d/c is to: Home              Anticipated d/c date is: > 3 days              Patient currently is not medically stable to d/c.  Worsening hypoxia on 15 L O2 via NRB, COVID-19 active treatment  Procedures:  2D echo- results pending   Consultants:   None  Antimicrobials:   Anti-infectives (From admission, onward)   Start     Dose/Rate Route Frequency Ordered Stop   09/27/20 1000  remdesivir 100 mg in sodium chloride 0.9 % 100 mL IVPB       "Followed by" Linked Group Details   100 mg 200 mL/hr over 30 Minutes Intravenous Daily 09/26/20 2208 09/30/20 1900   09/26/20 2215  remdesivir 200 mg in sodium chloride 0.9% 250 mL IVPB       "Followed by" Linked Group Details   200 mg 580 mL/hr over 30 Minutes Intravenous Once 09/26/20 2208 09/27/20 0146         Medications  Scheduled Meds: . baricitinib  4 mg Oral Daily  . enoxaparin (LOVENOX) injection  55 mg Subcutaneous Daily  . melatonin  3 mg Oral QHS  . methylPREDNISolone (SOLU-MEDROL) injection  60 mg Intravenous BID  . pantoprazole  40 mg Oral Daily  . sodium chloride flush  3 mL Intravenous Q12H   Continuous Infusions:  PRN Meds:.acetaminophen, ondansetron (ZOFRAN) IV, polyethylene glycol, sodium  chloride        Objective:   Vitals:   10/01/20 0423 10/01/20 0444 10/01/20 0725 10/01/20 0845  BP: 113/71  (!) 95/57   Pulse:   70   Resp: (!) 23  20   Temp: (!) 97.5 F (36.4 C)  97.7 F (36.5 C)   TempSrc: Oral  Oral   SpO2: (!) 83%  91% (!) 88%  Weight:  108.9 kg    Height:        Intake/Output Summary (  Last 24 hours) at 10/01/2020 1103 Last data filed at 10/01/2020 0900 Gross per 24 hour  Intake 726 ml  Output 1300 ml  Net -574 ml     Wt Readings from Last 3 Encounters:  10/01/20 108.9 kg  12/04/19 108.4 kg  11/20/19 107.9 kg    Physical Exam  Awake Alert, Oriented X 3, No new F.N deficits, Normal affect Symmetrical Chest wall movement, Good air movement bilaterally, CTAB RRR,No Gallops,Rubs or new Murmurs, No Parasternal Heave +ve B.Sounds, Abd Soft, No tenderness, No rebound - guarding or rigidity. No Cyanosis, Clubbing or edema, No new Rash or bruise      Data Reviewed:  I have personally reviewed following labs and imaging studies  Micro Results Recent Results (from the past 240 hour(s))  Resp Panel by RT-PCR (Flu A&B, Covid) Nasopharyngeal Swab     Status: Abnormal   Collection Time: 09/26/20  7:03 PM   Specimen: Nasopharyngeal Swab; Nasopharyngeal(NP) swabs in vial transport medium  Result Value Ref Range Status   SARS Coronavirus 2 by RT PCR POSITIVE (A) NEGATIVE Final    Comment: RESULT CALLED TO, READ BACK BY AND VERIFIED WITH: G TATE RN 09/26/20 2143 JDW (NOTE) SARS-CoV-2 target nucleic acids are DETECTED.  The SARS-CoV-2 RNA is generally detectable in upper respiratory specimens during the acute phase of infection. Positive results are indicative of the presence of the identified virus, but do not rule out bacterial infection or co-infection with other pathogens not detected by the test. Clinical correlation with patient history and other diagnostic information is necessary to determine patient infection status. The expected result  is Negative.  Fact Sheet for Patients: EntrepreneurPulse.com.au  Fact Sheet for Healthcare Providers: IncredibleEmployment.be  This test is not yet approved or cleared by the [place] FDA and  has been authorized for detection and/or diagnosis of SARS-CoV-2 by FDA under an Emergency Use Authorization (EUA).  This EUA will remain in effect (meaning this test can be used)  for the duration of  the COVID-19 declaration under Section 564(b)(1) of the Act, 21 U.S.C. section 360bbb-3(b)(1), unless the authorization is terminated or revoked sooner.     Influenza A by PCR NEGATIVE NEGATIVE Final   Influenza B by PCR NEGATIVE NEGATIVE Final    Comment: (NOTE) The Xpert Xpress SARS-CoV-2/FLU/RSV plus assay is intended as an aid in the diagnosis of influenza from Nasopharyngeal swab specimens and should not be used as a sole basis for treatment. Nasal washings and aspirates are unacceptable for Xpert Xpress SARS-CoV-2/FLU/RSV testing.  Fact Sheet for Patients: EntrepreneurPulse.com.au  Fact Sheet for Healthcare Providers: IncredibleEmployment.be  This test is not yet approved or cleared by the [place] FDA and has been authorized for detection and/or diagnosis of SARS-CoV-2 by FDA under an Emergency Use Authorization (EUA). This EUA will remain in effect (meaning this test can be used) for the duration of the COVID-19 declaration under Section 564(b)(1) of the Act, 21 U.S.C. section 360bbb-3(b)(1), unless the authorization is terminated or revoked.  Performed at [facility], [place] [address] [place], [place] [address]   Blood Culture (routine x 2)     Status: None (Preliminary result)   Collection Time: 09/27/20 12:31 AM   Specimen: BLOOD  Result Value Ref Range Status   Specimen Description BLOOD SITE NOT SPECIFIED  Final   Special Requests   Final    BOTTLES DRAWN AEROBIC AND ANAEROBIC Blood  Culture adequate volume   Culture   Final    NO GROWTH  4 DAYS Performed at [facility], [place] [address] [place], [place] [address]    Report Status PENDING  Incomplete  MRSA PCR Screening     Status: None   Collection Time: 09/28/20  4:14 AM   Specimen: Nasal Mucosa; Nasopharyngeal  Result Value Ref Range Status   MRSA by PCR NEGATIVE NEGATIVE Final    Comment:        The GeneXpert MRSA Assay (FDA approved for NASAL specimens only), is one component of a comprehensive MRSA colonization surveillance program. It is not intended to diagnose MRSA infection nor to guide or monitor treatment for MRSA infections. Performed at [facility], [place] [address], [name], [place] [address]     Radiology Reports CT ANGIO CHEST PE W OR WO CONTRAST  Result Date: 09/27/2020 CLINICAL DATA:  54 year old male with history of COVID-19, suspected pulmonary embolism. EXAM: CT ANGIOGRAPHY CHEST WITH CONTRAST TECHNIQUE: Multidetector CT imaging of the chest was performed using the standard protocol during bolus administration of intravenous contrast. Multiplanar CT image reconstructions and MIPs were obtained to evaluate the vascular anatomy. CONTRAST:  100 mL Omnipaque 350, intravenous COMPARISON:  Chest CT from 11/17/2019 FINDINGS: Cardiovascular: Satisfactory opacification of the pulmonary arteries to the segmental level. No evidence of pulmonary embolism. Normal heart size. No pericardial effusion. The heart is normal in size. No pericardial effusion. Severe coronary atherosclerotic calcifications. Mediastinum/Nodes: Similar appearing prominent right paratracheal lymph node measuring up to 1.2 cm in short axis. Otherwise no enlarged mediastinal, hilar, or axillary lymph nodes. Thyroid gland, trachea, and esophagus demonstrate no significant findings. Lungs/Pleura: Interval development of near diffuse, peripherally predominant ground-glass pulmonary opacities with a mid lung and basal predominance.  Similar appearing moderate upper lobe predominant paraseptal and centrilobular emphysema. No pleural effusion or pneumothorax. Upper Abdomen: The visualized upper abdomen is within normal limits. Musculoskeletal: No chest wall abnormality. No acute or significant osseous findings. Review of the MIP images confirms the above findings. IMPRESSION: Vascular: 1. No evidence of pulmonary embolism. 2. Severe coronary atherosclerotic calcifications. Non-Vascular: 1. Diffuse, mid lung and basilar, peripherally predominant ground-glass opacities - findings compatible with multifocal pneumonia associated with COVID-19. 2. Similar appearing moderate upper lobe predominant centrilobular and paraseptal emphysema. Electronically Signed   By: Ruthann Cancer MD   On: 09/27/2020 14:31   DG CHEST PORT 1 VIEW  Result Date: 09/26/2020 CLINICAL DATA:  COVID-19 EXAM: PORTABLE CHEST 1 VIEW COMPARISON:  09/26/2020 FINDINGS: Slight worsening of bilateral basilar predominant opacities. No pleural effusion or pneumothorax. Normal cardiomediastinal contours. IMPRESSION: Slight worsening of bilateral basilar predominant opacities. Electronically Signed   By: Ulyses Jarred M.D.   On: 09/26/2020 23:26   DG Chest Portable 1 View  Result Date: 09/26/2020 CLINICAL DATA:  Cough and shortness of breath. EXAM: PORTABLE CHEST 1 VIEW COMPARISON:  12/04/2019 FINDINGS: Cardiomegaly which is increased from prior exam. Pulmonary edema, moderate in degree. There are bilateral pleural effusions. No confluent consolidation. No pneumothorax. No acute osseous abnormalities are seen. IMPRESSION: CHF with cardiomegaly, pulmonary edema and bilateral pleural effusions. Electronically Signed   By: Keith Rake M.D.   On: 09/26/2020 19:14   ECHOCARDIOGRAM LIMITED  Result Date: 09/28/2020    ECHOCARDIOGRAM LIMITED REPORT   Patient Name:   Reice Loletha Grayer Keesling Date of Exam: 09/28/2020 Medical Rec #:  [id number]          Height:       71.0 in Accession #:     [id number]  Weight:       244.3 lb Date of Birth:  1966-07-16          BSA:          2.296 m Patient Age:    59 years           BP:           120/84 mmHg Patient Gender: M                  HR:           73 bpm. Exam Location:  Inpatient Procedure: Limited Echo, Limited Color Doppler and Cardiac Doppler Indications:    acute respiratory distress  History:        Patient has prior history of Echocardiogram examinations, most                 recent 03/30/2016. CAD; Covid.  Sonographer:    Johny Chess Referring Phys: [id number] Candace Gallus MELVIN  Sonographer Comments: Image acquisition challenging due to respiratory motion. IMPRESSIONS  1. Left ventricular ejection fraction, by estimation, is 60 to 65%. The left ventricle has normal function. The left ventricle has no regional wall motion abnormalities. There is mild left ventricular hypertrophy.  2. Right ventricular systolic function is normal. The right ventricular size is normal.  3. The mitral valve is normal in structure. No evidence of mitral valve regurgitation. No evidence of mitral stenosis.  4. The aortic valve is tricuspid.  5. The inferior vena cava is normal in size with greater than 50% respiratory variability, suggesting right atrial pressure of 3 mmHg. FINDINGS  Left Ventricle: Left ventricular ejection fraction, by estimation, is 60 to 65%. The left ventricle has normal function. The left ventricle has no regional wall motion abnormalities. The left ventricular internal cavity size was normal in size. There is  mild left ventricular hypertrophy. Right Ventricle: The right ventricular size is normal. No increase in right ventricular wall thickness. Right ventricular systolic function is normal. Pericardium: There is no evidence of pericardial effusion. Mitral Valve: The mitral valve is normal in structure. No evidence of mitral valve stenosis. Tricuspid Valve: The tricuspid valve is normal in structure. Tricuspid valve regurgitation is not  demonstrated. No evidence of tricuspid stenosis. Aortic Valve: The aortic valve is tricuspid. Pulmonic Valve: The pulmonic valve was not well visualized. Pulmonic valve regurgitation is not visualized. No evidence of pulmonic stenosis. Aorta: The aortic root is normal in size and structure. Pulmonary Artery: Indeterminant PASP, inadequate TR jet. Venous: The inferior vena cava is normal in size with greater than 50% respiratory variability, suggesting right atrial pressure of 3 mmHg. IAS/Shunts: No atrial level shunt detected by color flow Doppler. LEFT VENTRICLE PLAX 2D LVIDd:         4.20 cm LVIDs:         2.80 cm LV PW:         1.30 cm LV IVS:        1.10 cm LVOT diam:     2.00 cm LV SV:         71 LV SV Index:   31 LVOT Area:     3.14 cm  IVC IVC diam: 1.70 cm LEFT ATRIUM         Index LA diam:    4.30 cm 1.87 cm/m  AORTIC VALVE LVOT Vmax:   116.00 cm/s LVOT Vmean:  76.100 cm/s LVOT VTI:    0.227 m  AORTA Ao Root diam: 3.10 cm MITRAL  VALVE MV Area (PHT): 2.91 cm    SHUNTS MV Decel Time: 261 msec    Systemic VTI:  0.23 m MV E velocity: 75.40 cm/s  Systemic Diam: 2.00 cm MV A velocity: 66.00 cm/s MV E/A ratio:  1.14 Carlyle Dolly MD Electronically signed by Carlyle Dolly MD Signature Date/Time: 09/28/2020/2:52:33 PM    Final     Lab Data:  CBC: Recent Labs  Lab 09/27/20 0432 09/28/20 0007 09/29/20 0422 09/30/20 0430 10/01/20 0119  WBC 5.2 4.3 9.7 10.7* 12.2*  NEUTROABS 4.7 3.3 9.2* 9.2* 11.0*  HGB 15.6 15.1 15.0 14.3 14.6  HCT 44.4 44.5 44.4 42.5 41.7  MCV 88.6 88.5 89.5 90.4 87.8  PLT 90* 115* 171 184 0000000   Basic Metabolic Panel: Recent Labs  Lab 09/27/20 0432 09/28/20 0007 09/29/20 0422 09/30/20 0430 10/01/20 0119  NA 133* 133* 137 135 134*  K 4.3 3.9 4.3 4.4 4.9  CL 101 99 99 101 104  CO2 21* 22 24 24  20*  GLUCOSE 174* 171* 180* 185* 169*  BUN 23* 25* 33* 33* 28*  CREATININE 1.11 0.95 1.13 1.08 1.03  CALCIUM 7.7* 8.0* 8.4* 8.2* 8.3*  MG 2.7* 2.8* 3.0* 2.8* 2.8*  PHOS  3.1 2.9 3.0 3.5 2.7   GFR: Estimated Creatinine Clearance: 100.5 mL/min (by C-G formula based on SCr of 1.03 mg/dL). Liver Function Tests: Recent Labs  Lab 09/27/20 0432 09/28/20 0007 09/29/20 0422 09/30/20 0430 10/01/20 0119  AST 176* 108* 88* 68* 51*  ALT 119* 114* 133* 132* 131*  ALKPHOS 91 94 109 106 101  BILITOT 0.9 1.3* 1.0 1.1 1.5*  PROT 6.9 7.0 6.8 6.5 6.3*  ALBUMIN 2.9* 2.8* 2.9* 2.8* 2.9*   No results for input(s): LIPASE, AMYLASE in the last 168 hours. No results for input(s): AMMONIA in the last 168 hours. Coagulation Profile: No results for input(s): INR, PROTIME in the last 168 hours. Cardiac Enzymes: No results for input(s): CKTOTAL, CKMB, CKMBINDEX, TROPONINI in the last 168 hours. BNP (last 3 results) No results for input(s): PROBNP in the last 8760 hours. HbA1C: No results for input(s): HGBA1C in the last 72 hours. CBG: No results for input(s): GLUCAP in the last 168 hours. Lipid Profile: No results for input(s): CHOL, HDL, LDLCALC, TRIG, CHOLHDL, LDLDIRECT in the last 72 hours. Thyroid Function Tests: No results for input(s): TSH, T4TOTAL, FREET4, T3FREE, THYROIDAB in the last 72 hours. Anemia Panel: Recent Labs    09/30/20 0430 10/01/20 0119  FERRITIN 2,100* 2,102*   Urine analysis: No results found for: COLORURINE, APPEARANCEUR, LABSPEC, PHURINE, GLUCOSEU, HGBUR, BILIRUBINUR, KETONESUR, PROTEINUR, UROBILINOGEN, NITRITE, Louie Boston Marites Nath M.D. [facility] 10/01/2020, 11:03 AM   Call night coverage person covering after 7pm

## 2020-10-01 NOTE — Progress Notes (Signed)
   10/01/20 2014  Assess: MEWS Score  Temp (!) 97.5 F (36.4 C)  BP 119/74  Pulse Rate 72  ECG Heart Rate 72  Resp (!) 28  SpO2 90 %  Assess: MEWS Score  MEWS Temp 0  MEWS Systolic 0  MEWS Pulse 0  MEWS RR 2  MEWS LOC 0  MEWS Score 2  MEWS Score Color Yellow  Assess: if the MEWS score is Yellow or Red  Were vital signs taken at a resting state? Yes  Focused Assessment No change from prior assessment  Early Detection of Sepsis Score *See Row Information* Low  MEWS guidelines implemented *See Row Information* Yes  Treat  MEWS Interventions Escalated (See documentation below)  Pain Scale 0-10  Pain Score 0  Take Vital Signs  Increase Vital Sign Frequency  Yellow: Q 2hr X 2 then Q 4hr X 2, if remains yellow, continue Q 4hrs  Escalate  MEWS: Escalate Yellow: discuss with charge nurse/RN and consider discussing with provider and RRT  Notify: Charge Nurse/RN  Name of Charge Nurse/RN Notified Nikki RN  Date Charge Nurse/RN Notified 09/22/20  Time Charge Nurse/RN Notified 2120  Document  Progress note created (see row info) Yes

## 2020-10-02 ENCOUNTER — Encounter (HOSPITAL_COMMUNITY): Payer: Self-pay | Admitting: Internal Medicine

## 2020-10-02 LAB — COMPREHENSIVE METABOLIC PANEL
ALT: 95 U/L — ABNORMAL HIGH (ref 0–44)
AST: 26 U/L (ref 15–41)
Albumin: 3 g/dL — ABNORMAL LOW (ref 3.5–5.0)
Alkaline Phosphatase: 105 U/L (ref 38–126)
Anion gap: 11 (ref 5–15)
BUN: 27 mg/dL — ABNORMAL HIGH (ref 6–20)
CO2: 19 mmol/L — ABNORMAL LOW (ref 22–32)
Calcium: 8.5 mg/dL — ABNORMAL LOW (ref 8.9–10.3)
Chloride: 104 mmol/L (ref 98–111)
Creatinine, Ser: 0.97 mg/dL (ref 0.61–1.24)
GFR, Estimated: >60 mL/min (ref >60)
Glucose, Bld: 158 mg/dL — ABNORMAL HIGH (ref 70–99)
Potassium: 4.8 mmol/L (ref 3.5–5.1)
Sodium: 134 mmol/L — ABNORMAL LOW (ref 135–145)
Total Bilirubin: 1.2 mg/dL (ref 0.3–1.2)
Total Protein: 6.6 g/dL (ref 6.5–8.1)

## 2020-10-02 LAB — CBC
HCT: 43.5 % (ref 39.0–52.0)
Hemoglobin: 15.5 g/dL (ref 13.0–17.0)
MCH: 31.4 pg (ref 26.0–34.0)
MCHC: 35.6 g/dL (ref 30.0–36.0)
MCV: 88.2 fL (ref 80.0–100.0)
Platelets: 238 10*3/uL (ref 150–400)
RBC: 4.93 MIL/uL (ref 4.22–5.81)
RDW: 13.3 % (ref 11.5–15.5)
WBC: 13.5 10*3/uL — ABNORMAL HIGH (ref 4.0–10.5)
nRBC: 0 % (ref 0.0–0.2)

## 2020-10-02 LAB — CULTURE, BLOOD (ROUTINE X 2)
Culture: NO GROWTH
Special Requests: ADEQUATE

## 2020-10-02 LAB — D-DIMER, QUANTITATIVE: D-Dimer, Quant: 1.57 ug/mL-FEU — ABNORMAL HIGH (ref 0.00–0.50)

## 2020-10-02 NOTE — Progress Notes (Signed)
Physical Therapy Treatment Patient Details Name: Matthew [name] MRN: [id number] DOB: [birth year] Today's Date: 10/02/2020    History of Present Illness Pt is a 54 y.o. male admitted 09/26/20 with SOB and GI distress (had declined COVID test when seen in urgent care). Pt now testing (+) COVID-19. Workup for acute hypoxic respiratory failure due to acute COVID-19 viral PNA. PMH includes CAD, CHF, tobacco use (quit 11/2019); pt unvaccinated.   PT Comments    Pt progressing with mobility, able to tolerate multiple bouts of standing activity and ambulation with RW at supervision-level. SpO2 down to 79% on 15L O2 HFNC with activity, although pleth unreliable; reading >/86% when pleth reliable. Pt motivated to participate and regain PLOF. Reviewed importance of pursed lip breathing and current O2 needs. Will continue to follow acutely.   Follow Up Recommendations  No PT follow up (pending progression)     Equipment Recommendations  None recommended by PT    Recommendations for Other Services       Precautions / Restrictions Precautions Precautions: Fall;Other (comment) Precaution Comments: Watch SpO2, needing 15L O2 HFNC with mobility Restrictions Weight Bearing Restrictions: No    Mobility  Bed Mobility               General bed mobility comments: Received sitting in recliner  Transfers Overall transfer level: Needs assistance Equipment used: Rolling walker (2 wheeled) Transfers: Sit to/from Stand Sit to Stand: Supervision         General transfer comment: Pt requesting use of RW for stability; supervision for safety  Ambulation/Gait Ambulation/Gait assistance: Supervision Gait Distance (Feet): 40 Feet (+ 40) Assistive device: Rolling walker (2 wheeled) Gait Pattern/deviations: Step-through pattern;Trunk flexed Gait velocity: Decreased   General Gait Details: Slow, labored but steady gait with RW and supervision for safety/lines; DOE 3-4/4 requiring cues for  pursed lip breathing; prolonged seated rest break to recover, then ambulating an additional 40'. SpO2 down to 79% on 15L O2 HFNC (unreliable pleth)   Stairs             Wheelchair Mobility    Modified Rankin (Stroke Patients Only)       Balance Overall balance assessment: Needs assistance   Sitting balance-Leahy Scale: Good       Standing balance-Leahy Scale: Fair                              Cognition Arousal/Alertness: Awake/alert Behavior During Therapy: WFL for tasks assessed/performed (frustrated) Overall Cognitive Status: Within Functional Limits for tasks assessed                                        Exercises      General Comments General comments (skin integrity, edema, etc.): Appears frustrated with being in the hospital and does not like the food; asking questions like, "What else can I be doing to get out of here faster?" Receptive to education      Pertinent Vitals/Pain Pain Assessment: No/denies pain    Home Living                      Prior Function            PT Goals (current goals can now be found in the care plan section) Progress towards PT goals: Progressing toward goals    Frequency  Min 3X/week      PT Plan Current plan remains appropriate    Co-evaluation              AM-PAC PT "6 Clicks" Mobility   Outcome Measure  Help needed turning from your back to your side while in a flat bed without using bedrails?: None Help needed moving from lying on your back to sitting on the side of a flat bed without using bedrails?: None Help needed moving to and from a bed to a chair (including a wheelchair)?: A Little Help needed standing up from a chair using your arms (e.g., wheelchair or bedside chair)?: A Little Help needed to walk in hospital room?: A Little Help needed climbing 3-5 steps with a railing? : A Little 6 Click Score: 20    End of Session Equipment Utilized During  Treatment: Oxygen Activity Tolerance: Patient tolerated treatment well Patient left: in chair;with call bell/phone within reach Nurse Communication: Mobility status PT Visit Diagnosis: Other abnormalities of gait and mobility (R26.89)     Time: 1435-1500 PT Time Calculation (min) (ACUTE ONLY): 25 min  Charges:  $Gait Training: 8-22 mins $Therapeutic Exercise: 8-22 mins                     Ina Homes, PT, DPT Acute Rehabilitation Services  Pager [phone] Office [phone]  Malachy Chamber 10/02/2020, 4:26 PM

## 2020-10-02 NOTE — Progress Notes (Signed)
Occupational Therapy Evaluation Patient Details Name: Matthew [name] MRN: [id number] DOB: 20-Feb-1966 Today's Date: 10/02/2020    History of Present Illness Pt is a 54 y.o. male admitted 09/26/20 with SOB and GI distress (had declined COVID test when seen in urgent care). Pt now testing (+) COVID-19. Workup for acute hypoxic respiratory failure due to acute COVID-19 viral PNA. PMH includes CAD, CHF, tobacco use (quit 11/2019); pt unvaccinated.   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 Pt completed sit - stand x 3 on 15 L and mobilized to chair with desat to 89 and increased WOB. After mobilizing to chair completed marching x 40 steps on 15L with desat to 85 with quick rebound to 90. Complains of dizziness: BP sitting 105/65; standing 102/75.  Educated pt on pursed lip breathing and importance of monitoring breathing and shortness of breath in order to increase activity tolerance. Do not anticipate need for OT after DC. Will follow acutely to facilitate safe DC home.     Follow Up Recommendations  No OT follow up;Supervision - Intermittent    Equipment Recommendations  Tub/shower seat (pt declines)    Recommendations for Other Services       Precautions / Restrictions Precautions Precautions: Fall Precaution Comments: Watch SpO2      Mobility Bed Mobility Overal bed mobility: Modified Independent                  Transfers Overall transfer level: Needs assistance Equipment used: Rolling walker (2 wheeled) Transfers: Sit to/from [facility] Sit to Stand: Min guard Stand pivot transfers: Min guard       General transfer comment: Pt reaching for O2 tank when standing. States "my legs are weak". Feels more secure with RW    Balance Overall balance assessment: Needs assistance   Sitting balance-Leahy Scale: Good       Standing balance-Leahy Scale: Fair                             ADL either performed  or assessed with clinical judgement   ADL Overall ADL's : Needs assistance/impaired Eating/Feeding: Independent   Grooming: Set up;Sitting   Upper Body Bathing: Set up;Sitting   Lower Body Bathing: Min guard;Sit to/from stand   Upper Body Dressing : Set up;Sitting   Lower Body Dressing: Min guard;Sit to/from stand   Toilet Transfer: Minimal assistance;Stand-pivot   Toileting- Water quality scientist and Hygiene: Min guard;Sit to/from stand       Functional mobility during ADLs: Rolling walker;Cueing for safety;Min guard (steady assist) General ADL Comments: feels "lightheaded/weird" when stanidng. Increased SOB with minimal activity     Vision         Perception     Praxis      Pertinent Vitals/Pain Pain Assessment: No/denies pain     Hand Dominance Right   Extremity/Trunk Assessment Upper Extremity Assessment Upper Extremity Assessment: Generalized weakness   Lower Extremity Assessment Lower Extremity Assessment: Defer to PT evaluation   Cervical / Trunk Assessment Cervical / Trunk Assessment: Normal   Communication Communication Communication: No difficulties   Cognition Arousal/Alertness: Awake/alert Behavior During Therapy: WFL for tasks assessed/performed (frustrated) Overall Cognitive Status: Within Functional Limits for tasks assessed                                     General Comments  Appears frustrated with being in the hospital and does not like the food    Exercises Exercises: General Upper Extremity General Exercises - Upper Extremity Shoulder Flexion: AROM;Strengthening;Both;10 reps;Seated;Theraband Theraband Level (Shoulder Flexion): Level 2 (Red) Elbow Flexion: AROM;Strengthening;Both;10 reps;Seated;Theraband Theraband Level (Elbow Flexion): Level 2 (Red) Elbow Extension: Strengthening;Both;10 reps;Seated;Theraband Theraband Level (Elbow Extension): Level 2 (Red) Other Exercises Other Exercises: pursed lip breathing;  pt is a mouth breather   Shoulder Instructions      Home Living Family/patient expects to be discharged to:: Private residence Living Arrangements: Spouse/significant other Available Help at Discharge: Family;Available 24 hours/day Type of Home: House Home Access: Stairs to enter [employer] of Steps: 3 Entrance Stairs-Rails: Right Home Layout: One level     Bathroom Shower/Tub: Occupational psychologist: Standard Bathroom Accessibility: Yes How Accessible: Accessible via walker Home Equipment: None          Prior Functioning/Environment Level of Independence: Independent        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 Problem List: Decreased strength;Decreased activity tolerance;Impaired balance (sitting and/or standing);Decreased safety awareness;Decreased knowledge of use of DME or AE;Cardiopulmonary status limiting activity      OT Treatment/Interventions: Self-care/ADL training;Therapeutic exercise;Neuromuscular education;Energy conservation;DME and/or AE instruction;Therapeutic activities;Patient/family education;Balance training    OT Goals(Current goals can be found in the care plan section) Acute Rehab OT Goals Patient Stated Goal: to go home next week OT Goal Formulation: With patient Time For Goal Achievement: 10/16/20 Potential to Achieve Goals: Good  OT Frequency: Min 3X/week   Barriers to D/C:            Co-evaluation              AM-PAC OT "6 Clicks" Daily Activity     Outcome Measure Help from another person eating meals?: None Help from another person taking care of personal grooming?: A Little Help from another person toileting, which includes using toliet, bedpan, or urinal?: A Little Help from another person bathing (including washing, rinsing, drying)?: A Little Help from another person to put on and taking off regular upper body clothing?: A Little Help from another person to put on and taking off regular  lower body clothing?: A Little 6 Click Score: 19   End of Session Equipment Utilized During Treatment: Oxygen (11-15L) Nurse Communication: Mobility status  Activity Tolerance: Patient tolerated treatment well Patient left: in chair;with call bell/phone within reach  OT Visit Diagnosis: Unsteadiness on feet (R26.81);Other abnormalities of gait and mobility (R26.89);Muscle weakness (generalized) (M62.81);Dizziness and giddiness (R42)                Time: [id number] OT Time Calculation (min): 24 min Charges:  OT General Charges $OT Visit: 1 Visit OT Treatments $Self Care/Home Management : 8-22 mins  Maurie Boettcher, OT/L   Acute OT Clinical Specialist [place] Pager [phone] Office [phone]   [facility] 10/02/2020, 9:24 AM

## 2020-10-02 NOTE — Progress Notes (Signed)
[facility]                                                                              Patient Demographics  Matthew [name], is a 54 y.o. male, DOB - 1966-06-24, [id number]  Admit date - 09/26/2020   Admitting Physician Marcelyn Bruins, MD  Outpatient Primary MD for the patient is Patient, No Pcp Per  Outpatient specialists:   LOS - 6  days   Medical records reviewed and are as summarized below:    No chief complaint on file.      Brief summary   Patient is a 54 year old male with history of CAD status post stent x2, tobacco use (78-pack-year history, quit in February, 2021),  HLP, GERD, presented with shortness of breath.  Patient had gradual worsening of shortness of breath since 12/9 and GI illness from around 12/10-12/14.  He was seen at the urgent care for diarrhea, nausea and vomiting.  Patient was treated with fluids and supportive care, had declined Covid test at that time.  As shortness of breath progressively worsened, patient called the EMS, was found to have O2 sats 78% on room air with significant rails.  Patient was placed on 15 L NRB with improvement to 90%.  Vaccination status: Patient is unvaccinated for COVID-19  In ED, patient was tachypneic with respiratory rate 20s to 30s, requiring 6 to 10 L O2 via [place].  Creatinine 1.3, AST 103, ALT 64.  Platelets 88. Covid test positive. Chest x-ray showed cardiomegaly with some pulmonary edema and effusions.  Patient was given a dose of Lasix in ED and was started on IV steroids and remdesivir  Subjective:   Matthew [name] was seen and examined today.  Reports he had a good night sleep, report cough and dyspnea has improved as well, he denies any fever or chest pain.  Assessment & Plan   Acute hypoxic respiratory failure due to acute COVID-19 viral pneumonia during the ongoing COVID-19 pandemic- POA -He is unvaccinated - Patient presented with worsening shortness of breath, found to be  hypoxic, chest x-ray showed pulmonary edema with effusions, Covid test positive, unvaccinated.   - Continue IV Solu-Medrol, 60 mg every 12 hours. -Treated with IV remdesivir . -Continue with baricitinib  - CT angiogram chest negative for PE - Continue Supportive care: vitamin C/zinc, albuterol, Tylenol, I-S, flutter valve, proning advised. -Due to severe Covid, significant oxygen requirement, this has been improving, this morning he is on 11 L high flow nasal cannula. -Encouraged use incentive spirometry, flutter valve, to prone and out of bed to chair.  - Oxygen - SpO2: 99 % O2 Flow Rate (L/min): 11 L/min FiO2 (%): (!) 15 % HFNC - Continue to follow labs as below  Lab Results  Component Value Date   SARSCOV2NAA POSITIVE (A) 09/26/2020   [place] NEGATIVE 02/06/2020   [place] NEGATIVE 11/17/2019     Recent Labs  Lab 09/27/20 0023 09/27/20 0432 09/28/20 0007 09/29/20 0422 09/30/20 0430 10/01/20 0119 10/02/20 0057  DDIMER 5.15* 4.30* 2.37* 1.49* 1.44* 1.76* 1.57*  FERRITIN 3,737* 3,855* 3,088* 2,958* 2,100* 2,102*  --   CRP 16.8* 16.1* 11.9* 3.6*  1.4* 0.9  --   ALT  --  119* 114* 133* 132* 131* 95*  PROCALCITON 0.65  --   --   --   --   --   --    Acute CHF with a preserved EF, with cardiomegaly, pulmonary edema, bilateral pleural effusion -Received Lasix 60 mg IV x1 in ED, BNP 23.4, troponin 9 -2D echo with a preserved EF -Treated with Lasix, volume status appears to be euvolemic currently, will keep on Lasix as needed only.  History of CAD status post stents x2 -Troponins negative, currently no chest pain, not on any home medications for this  Acute on chronic thrombocytopenia -Possibly worsened due to COVID-19, platelets 88K on admission, resolved  Transaminitis -Due to Covid, stable, monitor closely.  GERD Continue PPI I  Mild acute kidney injury  -Resolved  History of hemoptysis complicated by epistaxis in 11/2019 -Follows Dr. Inez Catalina, was  found to have clot in LUL bronchus   Obesity Estimated body mass index is 33.18 kg/m as calculated from the following:   Height as of this encounter: 5\' 11"  (1.803 m).   Weight as of this encounter: 107.9 kg.  Code Status: Full CODE STATUS DVT Prophylaxis: Lovenox Family Communication: Discussed with patient's wife patient's wife, Ethanmichael Flores [phone] (she is also covid positive) on  12/19.12/20.12/21.  Disposition Plan:     Status is: Inpatient  Remains inpatient appropriate because:Inpatient level of care appropriate due to severity of illness   Dispo: The patient is from: Home              Anticipated d/c is to: Home              Anticipated d/c date is: > 3 days              Patient currently is not medically stable to d/c. \ Procedures:  2D echo  Consultants:   None  Antimicrobials:   Anti-infectives (From admission, onward)   Start     Dose/Rate Route Frequency Ordered Stop   09/27/20 1000  remdesivir 100 mg in sodium chloride 0.9 % 100 mL IVPB       "Followed by" Linked Group Details   100 mg 200 mL/hr over 30 Minutes Intravenous Daily 09/26/20 2208 09/30/20 1900   09/26/20 2215  remdesivir 200 mg in sodium chloride 0.9% 250 mL IVPB       "Followed by" Linked Group Details   200 mg 580 mL/hr over 30 Minutes Intravenous Once 09/26/20 2208 09/27/20 0146         Medications  Scheduled Meds: . baricitinib  4 mg Oral Daily  . enoxaparin (LOVENOX) injection  55 mg Subcutaneous Daily  . feeding supplement  237 mL Oral BID BM  . melatonin  3 mg Oral QHS  . methylPREDNISolone (SOLU-MEDROL) injection  60 mg Intravenous BID  . pantoprazole  40 mg Oral Daily  . polyethylene glycol  17 g Oral BID  . sodium chloride flush  3 mL Intravenous Q12H   Continuous Infusions:  PRN Meds:.acetaminophen, ondansetron (ZOFRAN) IV, polyethylene glycol, sodium chloride        Objective:   Vitals:   10/02/20 0005 10/02/20 0413 10/02/20 0804 10/02/20 1227  BP:  118/63 100/61 105/65 128/69  Pulse: 75 71 81 95  Resp: (!) 26 (!) 25 (!) 22 (!) 21  Temp: 97.7 F (36.5 C) 97.6 F (36.4 C) 97.7 F (36.5 C) (!) 97.4 F (36.3 C)  TempSrc: Oral Oral Oral Oral  SpO2:  92% 93% 91% 99%  Weight:  107.9 kg    Height:        Intake/Output Summary (Last 24 hours) at 10/02/2020 1238 Last data filed at 10/02/2020 0956 Gross per 24 hour  Intake 580 ml  Output 1100 ml  Net -520 ml     Wt Readings from Last 3 Encounters:  10/02/20 107.9 kg  12/04/19 108.4 kg  11/20/19 107.9 kg    Physical Exam  Awake Alert, Oriented X 3, No new F.N deficits, Normal affect Symmetrical Chest wall movement, Good air movement bilaterally, CTAB RRR,No Gallops,Rubs or new Murmurs, No Parasternal Heave +ve B.Sounds, Abd Soft, No tenderness, No rebound - guarding or rigidity. No Cyanosis, Clubbing or edema, No new Rash or bruise      Data Reviewed:  I have personally reviewed following labs and imaging studies  Micro Results Recent Results (from the past 240 hour(s))  Resp Panel by RT-PCR (Flu A&B, Covid) Nasopharyngeal Swab     Status: Abnormal   Collection Time: 09/26/20  7:03 PM   Specimen: Nasopharyngeal Swab; Nasopharyngeal(NP) swabs in vial transport medium  Result Value Ref Range Status   SARS Coronavirus 2 by RT PCR POSITIVE (A) NEGATIVE Final    Comment: RESULT CALLED TO, READ BACK BY AND VERIFIED WITH: G TATE RN 09/26/20 2143 JDW (NOTE) SARS-CoV-2 target nucleic acids are DETECTED.  The SARS-CoV-2 RNA is generally detectable in upper respiratory specimens during the acute phase of infection. Positive results are indicative of the presence of the identified virus, but do not rule out bacterial infection or co-infection with other pathogens not detected by the test. Clinical correlation with patient history and other diagnostic information is necessary to determine patient infection status. The expected result is Negative.  Fact Sheet for  Patients: EntrepreneurPulse.com.au  Fact Sheet for Healthcare Providers: IncredibleEmployment.be  This test is not yet approved or cleared by the [place] FDA and  has been authorized for detection and/or diagnosis of SARS-CoV-2 by FDA under an Emergency Use Authorization (EUA).  This EUA will remain in effect (meaning this test can be used)  for the duration of  the COVID-19 declaration under Section 564(b)(1) of the Act, 21 U.S.C. section 360bbb-3(b)(1), unless the authorization is terminated or revoked sooner.     Influenza A by PCR NEGATIVE NEGATIVE Final   Influenza B by PCR NEGATIVE NEGATIVE Final    Comment: (NOTE) The Xpert Xpress SARS-CoV-2/FLU/RSV plus assay is intended as an aid in the diagnosis of influenza from Nasopharyngeal swab specimens and should not be used as a sole basis for treatment. Nasal washings and aspirates are unacceptable for Xpert Xpress SARS-CoV-2/FLU/RSV testing.  Fact Sheet for Patients: EntrepreneurPulse.com.au  Fact Sheet for Healthcare Providers: IncredibleEmployment.be  This test is not yet approved or cleared by the [place] FDA and has been authorized for detection and/or diagnosis of SARS-CoV-2 by FDA under an Emergency Use Authorization (EUA). This EUA will remain in effect (meaning this test can be used) for the duration of the COVID-19 declaration under Section 564(b)(1) of the Act, 21 U.S.C. section 360bbb-3(b)(1), unless the authorization is terminated or revoked.  Performed at [facility], [place] [address], [place], [place] [address]   Blood Culture (routine x 2)     Status: None (Preliminary result)   Collection Time: 09/27/20 12:31 AM   Specimen: BLOOD  Result Value Ref Range Status   Specimen Description BLOOD SITE NOT SPECIFIED  Final   Special Requests   Final  BOTTLES DRAWN AEROBIC AND ANAEROBIC Blood Culture adequate volume    Culture   Final    NO GROWTH 4 DAYS Performed at [facility], [place] [address], [place], [place] [address]    Report Status PENDING  Incomplete  MRSA PCR Screening     Status: None   Collection Time: 09/28/20  4:14 AM   Specimen: Nasal Mucosa; Nasopharyngeal  Result Value Ref Range Status   MRSA by PCR NEGATIVE NEGATIVE Final    Comment:        The GeneXpert MRSA Assay (FDA approved for NASAL specimens only), is one component of a comprehensive MRSA colonization surveillance program. It is not intended to diagnose MRSA infection nor to guide or monitor treatment for MRSA infections. Performed at [facility], [place] [address], [place], [place] [address]     Radiology Reports CT ANGIO CHEST PE W OR WO CONTRAST  Result Date: 09/27/2020 CLINICAL DATA:  54 year old male with history of COVID-19, suspected pulmonary embolism. EXAM: CT ANGIOGRAPHY CHEST WITH CONTRAST TECHNIQUE: Multidetector CT imaging of the chest was performed using the standard protocol during bolus administration of intravenous contrast. Multiplanar CT image reconstructions and MIPs were obtained to evaluate the vascular anatomy. CONTRAST:  100 mL Omnipaque 350, intravenous COMPARISON:  Chest CT from 11/17/2019 FINDINGS: Cardiovascular: Satisfactory opacification of the pulmonary arteries to the segmental level. No evidence of pulmonary embolism. Normal heart size. No pericardial effusion. The heart is normal in size. No pericardial effusion. Severe coronary atherosclerotic calcifications. Mediastinum/Nodes: Similar appearing prominent right paratracheal lymph node measuring up to 1.2 cm in short axis. Otherwise no enlarged mediastinal, hilar, or axillary lymph nodes. Thyroid gland, trachea, and esophagus demonstrate no significant findings. Lungs/Pleura: Interval development of near diffuse, peripherally predominant ground-glass pulmonary opacities with a mid lung and basal predominance. Similar appearing moderate  upper lobe predominant paraseptal and centrilobular emphysema. No pleural effusion or pneumothorax. Upper Abdomen: The visualized upper abdomen is within normal limits. Musculoskeletal: No chest wall abnormality. No acute or significant osseous findings. Review of the MIP images confirms the above findings. IMPRESSION: Vascular: 1. No evidence of pulmonary embolism. 2. Severe coronary atherosclerotic calcifications. Non-Vascular: 1. Diffuse, mid lung and basilar, peripherally predominant ground-glass opacities - findings compatible with multifocal pneumonia associated with COVID-19. 2. Similar appearing moderate upper lobe predominant centrilobular and paraseptal emphysema. Electronically Signed   By: Ruthann Cancer MD   On: 09/27/2020 14:31   DG CHEST PORT 1 VIEW  Result Date: 09/26/2020 CLINICAL DATA:  COVID-19 EXAM: PORTABLE CHEST 1 VIEW COMPARISON:  09/26/2020 FINDINGS: Slight worsening of bilateral basilar predominant opacities. No pleural effusion or pneumothorax. Normal cardiomediastinal contours. IMPRESSION: Slight worsening of bilateral basilar predominant opacities. Electronically Signed   By: Ulyses Jarred M.D.   On: 09/26/2020 23:26   DG Chest Portable 1 View  Result Date: 09/26/2020 CLINICAL DATA:  Cough and shortness of breath. EXAM: PORTABLE CHEST 1 VIEW COMPARISON:  12/04/2019 FINDINGS: Cardiomegaly which is increased from prior exam. Pulmonary edema, moderate in degree. There are bilateral pleural effusions. No confluent consolidation. No pneumothorax. No acute osseous abnormalities are seen. IMPRESSION: CHF with cardiomegaly, pulmonary edema and bilateral pleural effusions. Electronically Signed   By: Keith Rake M.D.   On: 09/26/2020 19:14   ECHOCARDIOGRAM LIMITED  Result Date: 09/28/2020    ECHOCARDIOGRAM LIMITED REPORT   Patient Name:   Tanvir Loletha Grayer Robley Date of Exam: 09/28/2020 Medical Rec #:  [id number]          Height:  71.0 in Accession #:    [id number]          Weight:       244.3 lb Date of Birth:  06-22-1966          BSA:          2.296 m Patient Age:    72 years           BP:           120/84 mmHg Patient Gender: M                  HR:           73 bpm. Exam Location:  Inpatient Procedure: Limited Echo, Limited Color Doppler and Cardiac Doppler Indications:    acute respiratory distress  History:        Patient has prior history of Echocardiogram examinations, most                 recent 03/30/2016. CAD; Covid.  Sonographer:    Johny Chess Referring Phys: [id number] Candace Gallus MELVIN  Sonographer Comments: Image acquisition challenging due to respiratory motion. IMPRESSIONS  1. Left ventricular ejection fraction, by estimation, is 60 to 65%. The left ventricle has normal function. The left ventricle has no regional wall motion abnormalities. There is mild left ventricular hypertrophy.  2. Right ventricular systolic function is normal. The right ventricular size is normal.  3. The mitral valve is normal in structure. No evidence of mitral valve regurgitation. No evidence of mitral stenosis.  4. The aortic valve is tricuspid.  5. The inferior vena cava is normal in size with greater than 50% respiratory variability, suggesting right atrial pressure of 3 mmHg. FINDINGS  Left Ventricle: Left ventricular ejection fraction, by estimation, is 60 to 65%. The left ventricle has normal function. The left ventricle has no regional wall motion abnormalities. The left ventricular internal cavity size was normal in size. There is  mild left ventricular hypertrophy. Right Ventricle: The right ventricular size is normal. No increase in right ventricular wall thickness. Right ventricular systolic function is normal. Pericardium: There is no evidence of pericardial effusion. Mitral Valve: The mitral valve is normal in structure. No evidence of mitral valve stenosis. Tricuspid Valve: The tricuspid valve is normal in structure. Tricuspid valve regurgitation is not demonstrated. No  evidence of tricuspid stenosis. Aortic Valve: The aortic valve is tricuspid. Pulmonic Valve: The pulmonic valve was not well visualized. Pulmonic valve regurgitation is not visualized. No evidence of pulmonic stenosis. Aorta: The aortic root is normal in size and structure. Pulmonary Artery: Indeterminant PASP, inadequate TR jet. Venous: The inferior vena cava is normal in size with greater than 50% respiratory variability, suggesting right atrial pressure of 3 mmHg. IAS/Shunts: No atrial level shunt detected by color flow Doppler. LEFT VENTRICLE PLAX 2D LVIDd:         4.20 cm LVIDs:         2.80 cm LV PW:         1.30 cm LV IVS:        1.10 cm LVOT diam:     2.00 cm LV SV:         71 LV SV Index:   31 LVOT Area:     3.14 cm  IVC IVC diam: 1.70 cm LEFT ATRIUM         Index LA diam:    4.30 cm 1.87 cm/m  AORTIC VALVE LVOT Vmax:   116.00 cm/s LVOT Vmean:  76.100 cm/s LVOT VTI:    0.227 m  AORTA Ao Root diam: 3.10 cm MITRAL VALVE MV Area (PHT): 2.91 cm    SHUNTS MV Decel Time: 261 msec    Systemic VTI:  0.23 m MV E velocity: 75.40 cm/s  Systemic Diam: 2.00 cm MV A velocity: 66.00 cm/s MV E/A ratio:  1.14 Carlyle Dolly MD Electronically signed by Carlyle Dolly MD Signature Date/Time: 09/28/2020/2:52:33 PM    Final     Lab Data:  CBC: Recent Labs  Lab 09/27/20 0432 09/28/20 0007 09/29/20 0422 09/30/20 0430 10/01/20 0119 10/02/20 0057  WBC 5.2 4.3 9.7 10.7* 12.2* 13.5*  NEUTROABS 4.7 3.3 9.2* 9.2* 11.0*  --   HGB 15.6 15.1 15.0 14.3 14.6 15.5  HCT 44.4 44.5 44.4 42.5 41.7 43.5  MCV 88.6 88.5 89.5 90.4 87.8 88.2  PLT 90* 115* 171 184 204 [id number]   Basic Metabolic Panel: Recent Labs  Lab 09/27/20 0432 09/28/20 0007 09/29/20 0422 09/30/20 0430 10/01/20 0119 10/02/20 0057  NA 133* 133* 137 135 134* 134*  K 4.3 3.9 4.3 4.4 4.9 4.8  CL 101 99 99 101 104 104  CO2 21* 22 24 24  20* 19*  GLUCOSE 174* 171* 180* 185* 169* 158*  BUN 23* 25* 33* 33* 28* 27*  CREATININE 1.11 0.95 1.13 1.08 1.03  0.97  CALCIUM 7.7* 8.0* 8.4* 8.2* 8.3* 8.5*  MG 2.7* 2.8* 3.0* 2.8* 2.8*  --   PHOS 3.1 2.9 3.0 3.5 2.7  --    GFR: Estimated Creatinine Clearance: 106.2 mL/min (by C-G formula based on SCr of 0.97 mg/dL). Liver Function Tests: Recent Labs  Lab 09/28/20 0007 09/29/20 0422 09/30/20 0430 10/01/20 0119 10/02/20 0057  AST 108* 88* 68* 51* 26  ALT 114* 133* 132* 131* 95*  ALKPHOS 94 109 106 101 105  BILITOT 1.3* 1.0 1.1 1.5* 1.2  PROT 7.0 6.8 6.5 6.3* 6.6  ALBUMIN 2.8* 2.9* 2.8* 2.9* 3.0*   No results for input(s): LIPASE, AMYLASE in the last 168 hours. No results for input(s): AMMONIA in the last 168 hours. Coagulation Profile: No results for input(s): INR, PROTIME in the last 168 hours. Cardiac Enzymes: No results for input(s): CKTOTAL, CKMB, CKMBINDEX, TROPONINI in the last 168 hours. BNP (last 3 results) No results for input(s): PROBNP in the last 8760 hours. HbA1C: No results for input(s): HGBA1C in the last 72 hours. CBG: No results for input(s): GLUCAP in the last 168 hours. Lipid Profile: No results for input(s): CHOL, HDL, LDLCALC, TRIG, CHOLHDL, LDLDIRECT in the last 72 hours. Thyroid Function Tests: No results for input(s): TSH, T4TOTAL, FREET4, T3FREE, THYROIDAB in the last 72 hours. Anemia Panel: Recent Labs    09/30/20 0430 10/01/20 0119  FERRITIN 2,100* 2,102*   Urine analysis: No results found for: COLORURINE, APPEARANCEUR, LABSPEC, PHURINE, GLUCOSEU, HGBUR, BILIRUBINUR, KETONESUR, PROTEINUR, UROBILINOGEN, NITRITE, Louie Boston Keiandre Cygan M.D. [facility] 10/02/2020, 12:38 PM   Call night coverage person covering after 7pm

## 2020-10-03 LAB — COMPREHENSIVE METABOLIC PANEL
ALT: 67 U/L — ABNORMAL HIGH (ref 0–44)
AST: 18 U/L (ref 15–41)
Albumin: 2.9 g/dL — ABNORMAL LOW (ref 3.5–5.0)
Alkaline Phosphatase: 90 U/L (ref 38–126)
Anion gap: 12 (ref 5–15)
BUN: 28 mg/dL — ABNORMAL HIGH (ref 6–20)
CO2: 18 mmol/L — ABNORMAL LOW (ref 22–32)
Calcium: 8.5 mg/dL — ABNORMAL LOW (ref 8.9–10.3)
Chloride: 102 mmol/L (ref 98–111)
Creatinine, Ser: 0.93 mg/dL (ref 0.61–1.24)
GFR, Estimated: >60 mL/min (ref >60)
Glucose, Bld: 179 mg/dL — ABNORMAL HIGH (ref 70–99)
Potassium: 5 mmol/L (ref 3.5–5.1)
Sodium: 132 mmol/L — ABNORMAL LOW (ref 135–145)
Total Bilirubin: 1.4 mg/dL — ABNORMAL HIGH (ref 0.3–1.2)
Total Protein: 6.5 g/dL (ref 6.5–8.1)

## 2020-10-03 LAB — CBC
HCT: 41.6 % (ref 39.0–52.0)
Hemoglobin: 15.1 g/dL (ref 13.0–17.0)
MCH: 31.6 pg (ref 26.0–34.0)
MCHC: 36.3 g/dL — ABNORMAL HIGH (ref 30.0–36.0)
MCV: 87 fL (ref 80.0–100.0)
Platelets: 244 10*3/uL (ref 150–400)
RBC: 4.78 MIL/uL (ref 4.22–5.81)
RDW: 13.3 % (ref 11.5–15.5)
WBC: 13.6 10*3/uL — ABNORMAL HIGH (ref 4.0–10.5)
nRBC: 0 % (ref 0.0–0.2)

## 2020-10-03 LAB — D-DIMER, QUANTITATIVE: D-Dimer, Quant: 2.36 ug/mL-FEU — ABNORMAL HIGH (ref 0.00–0.50)

## 2020-10-03 MED ORDER — BISACODYL 5 MG PO TBEC
10.0000 mg | DELAYED_RELEASE_TABLET | Freq: Once | ORAL | Status: AC
  Administered 2020-10-04: 10:00:00 10 mg via ORAL
  Filled 2020-10-03: qty 2

## 2020-10-03 MED ORDER — POLYETHYLENE GLYCOL 3350 17 G PO PACK: 17.0000 g | PACK | Freq: Once | ORAL | Status: DC

## 2020-10-03 MED ORDER — POLYETHYLENE GLYCOL 3350 17 G PO PACK
17.0000 g | PACK | Freq: Once | ORAL | Status: AC
  Administered 2020-10-04: 10:00:00 17 g via ORAL
  Filled 2020-10-03: qty 1

## 2020-10-03 NOTE — Progress Notes (Signed)
[facility]                                                                              Patient Demographics  Matthew [name], is a 54 y.o. male, DOB - 09-Jan-1966, [id number]  Admit date - 09/26/2020   Admitting Physician Marcelyn Bruins, MD  Outpatient Primary MD for the patient is Patient, No Pcp Per  Outpatient specialists:   LOS - 7  days   Medical records reviewed and are as summarized below:    No chief complaint on file.      Brief summary   Patient is a 54 year old male with history of CAD status post stent x2, tobacco use (78-pack-year history, quit in February, 2021),  HLP, GERD, presented with shortness of breath.  Patient had gradual worsening of shortness of breath since 12/9 and GI illness from around 12/10-12/14.  He was seen at the urgent care for diarrhea, nausea and vomiting.  Patient was treated with fluids and supportive care, had declined Covid test at that time.  As shortness of breath progressively worsened, patient called the EMS, was found to have O2 sats 78% on room air with significant rails.  Patient was placed on 15 L NRB with improvement to 90%.  Vaccination status: Patient is unvaccinated for COVID-19  In ED, patient was tachypneic with respiratory rate 20s to 30s, requiring 6 to 10 L O2 via [place].  Creatinine 1.3, AST 103, ALT 64.  Platelets 88. Covid test positive. Chest x-ray showed cardiomegaly with some pulmonary edema and effusions.  Patient was given a dose of Lasix in ED and was started on IV steroids and remdesivir  Subjective:   Matthew [name] was seen and examined today.  Reports he had a good night sleep, report cough and dyspnea has improved as well, he denies any fever or chest pain.  Assessment & Plan   Acute hypoxic respiratory failure due to acute COVID-19 viral pneumonia during the ongoing COVID-19 pandemic- POA -He is unvaccinated - Patient presented with worsening shortness of breath, found to be  hypoxic, chest x-ray showed pulmonary edema with effusions, Covid test positive, unvaccinated.   - Continue IV Solu-Medrol, 60 mg every 12 hours. -Treated with IV remdesivir . -Continue with baricitinib  - CT angiogram chest negative for PE - Continue Supportive care: vitamin C/zinc, albuterol, Tylenol, I-S, flutter valve, proning advised. -Due to severe Covid, significant oxygen requirement, this has been improving, this morning he is on 11 L high flow nasal cannula. -Encouraged use incentive spirometry, flutter valve, to prone and out of bed to chair.  - Oxygen - SpO2: 90 % O2 Flow Rate (L/min): 9 L/min FiO2 (%): (!) 15 % HFNC - Continue to follow labs as below  Lab Results  Component Value Date   SARSCOV2NAA POSITIVE (A) 09/26/2020   [place] NEGATIVE 02/06/2020   [place] NEGATIVE 11/17/2019     Recent Labs  Lab 09/27/20 0023 09/27/20 0432 09/28/20 0007 09/29/20 0422 09/30/20 0430 10/01/20 0119 10/02/20 0057 10/03/20 0112  DDIMER 5.15* 4.30* 2.37* 1.49* 1.44* 1.76* 1.57* 2.36*  FERRITIN 3,737* 3,855* 3,088* 2,958* 2,100* 2,102*  --   --  CRP 16.8* 16.1* 11.9* 3.6* 1.4* 0.9  --   --   ALT  --  119* 114* 133* 132* 131* 95* 67*  PROCALCITON 0.65  --   --   --   --   --   --   --    Acute CHF with a preserved EF, with cardiomegaly, pulmonary edema, bilateral pleural effusion -Received Lasix 60 mg IV x1 in ED, BNP 23.4, troponin 9 -2D echo with a preserved EF -Treated with Lasix, volume status appears to be euvolemic currently, will keep on Lasix as needed only.he appears euvolemic  History of CAD status post stents x2 -Troponins negative, currently no chest pain, not on any home medications for this  Acute on chronic thrombocytopenia -Possibly worsened due to COVID-19, platelets 88K on admission, resolved  Transaminitis -Due to Covid, stable, monitor closely.  GERD Continue PPI I  Mild acute kidney injury  -Resolved  History of hemoptysis complicated by  epistaxis in 11/2019 -Follows Dr. Inez Catalina, was found to have clot in LUL bronchus  Constipation - he was encouraged to take his laxatives.  Obesity Estimated body mass index is 33.18 kg/m as calculated from the following:   Height as of this encounter: 5\' 11"  (1.803 m).   Weight as of this encounter: 107.9 kg.  Code Status: Full CODE STATUS DVT Prophylaxis: Lovenox Family Communication: Discussed with patient's wife patient's wife, Damiere Burandt [phone] (she is also covid positive) on  12/19.12/20.12/21.12/22  Disposition Plan:     Status is: Inpatient  Remains inpatient appropriate because:Inpatient level of care appropriate due to severity of illness   Dispo: The patient is from: Home              Anticipated d/c is to: Home              Anticipated d/c date is: > 3 days              Patient currently is not medically stable to d/c. \ Procedures:  2D echo  Consultants:   None  Antimicrobials:   Anti-infectives (From admission, onward)   Start     Dose/Rate Route Frequency Ordered Stop   09/27/20 1000  remdesivir 100 mg in sodium chloride 0.9 % 100 mL IVPB       "Followed by" Linked Group Details   100 mg 200 mL/hr over 30 Minutes Intravenous Daily 09/26/20 2208 09/30/20 1900   09/26/20 2215  remdesivir 200 mg in sodium chloride 0.9% 250 mL IVPB       "Followed by" Linked Group Details   200 mg 580 mL/hr over 30 Minutes Intravenous Once 09/26/20 2208 09/27/20 0146         Medications  Scheduled Meds: . baricitinib  4 mg Oral Daily  . bisacodyl  10 mg Oral Once  . enoxaparin (LOVENOX) injection  55 mg Subcutaneous Daily  . feeding supplement  237 mL Oral BID BM  . melatonin  3 mg Oral QHS  . methylPREDNISolone (SOLU-MEDROL) injection  60 mg Intravenous BID  . pantoprazole  40 mg Oral Daily  . polyethylene glycol  17 g Oral Once  . sodium chloride flush  3 mL Intravenous Q12H   Continuous Infusions:  PRN Meds:.acetaminophen, ondansetron  (ZOFRAN) IV, polyethylene glycol, sodium chloride        Objective:   Vitals:   10/02/20 2333 10/03/20 0349 10/03/20 0500 10/03/20 1204  BP: 111/69 100/61  112/70  Pulse: 61 62  91  Resp: (!) 21 19  20  Temp: 97.8 F (36.6 C) 97.8 F (36.6 C)  98.6 F (37 C)  TempSrc: Oral Oral  Oral  SpO2: (!) 89% 92% 92% 90%  Weight:  107.9 kg    Height:        Intake/Output Summary (Last 24 hours) at 10/03/2020 1352 Last data filed at 10/03/2020 0900 Gross per 24 hour  Intake 726 ml  Output 300 ml  Net 426 ml     Wt Readings from Last 3 Encounters:  10/03/20 107.9 kg  12/04/19 108.4 kg  11/20/19 107.9 kg    Physical Exam  Awake Alert, Oriented X 3, No new F.N deficits, Normal affect Symmetrical Chest wall movement, Good air movement bilaterally, CTAB RRR,No Gallops,Rubs or new Murmurs, No Parasternal Heave +ve B.Sounds, Abd Soft, No tenderness, No rebound - guarding or rigidity. No Cyanosis, Clubbing or edema, No new Rash or bruise       Data Reviewed:  I have personally reviewed following labs and imaging studies  Micro Results Recent Results (from the past 240 hour(s))  Resp Panel by RT-PCR (Flu A&B, Covid) Nasopharyngeal Swab     Status: Abnormal   Collection Time: 09/26/20  7:03 PM   Specimen: Nasopharyngeal Swab; Nasopharyngeal(NP) swabs in vial transport medium  Result Value Ref Range Status   SARS Coronavirus 2 by RT PCR POSITIVE (A) NEGATIVE Final    Comment: RESULT CALLED TO, READ BACK BY AND VERIFIED WITH: G TATE RN 09/26/20 2143 JDW (NOTE) SARS-CoV-2 target nucleic acids are DETECTED.  The SARS-CoV-2 RNA is generally detectable in upper respiratory specimens during the acute phase of infection. Positive results are indicative of the presence of the identified virus, but do not rule out bacterial infection or co-infection with other pathogens not detected by the test. Clinical correlation with patient history and other diagnostic information is  necessary to determine patient infection status. The expected result is Negative.  Fact Sheet for Patients: EntrepreneurPulse.com.au  Fact Sheet for Healthcare Providers: IncredibleEmployment.be  This test is not yet approved or cleared by the [place] FDA and  has been authorized for detection and/or diagnosis of SARS-CoV-2 by FDA under an Emergency Use Authorization (EUA).  This EUA will remain in effect (meaning this test can be used)  for the duration of  the COVID-19 declaration under Section 564(b)(1) of the Act, 21 U.S.C. section 360bbb-3(b)(1), unless the authorization is terminated or revoked sooner.     Influenza A by PCR NEGATIVE NEGATIVE Final   Influenza B by PCR NEGATIVE NEGATIVE Final    Comment: (NOTE) The Xpert Xpress SARS-CoV-2/FLU/RSV plus assay is intended as an aid in the diagnosis of influenza from Nasopharyngeal swab specimens and should not be used as a sole basis for treatment. Nasal washings and aspirates are unacceptable for Xpert Xpress SARS-CoV-2/FLU/RSV testing.  Fact Sheet for Patients: EntrepreneurPulse.com.au  Fact Sheet for Healthcare Providers: IncredibleEmployment.be  This test is not yet approved or cleared by the [place] FDA and has been authorized for detection and/or diagnosis of SARS-CoV-2 by FDA under an Emergency Use Authorization (EUA). This EUA will remain in effect (meaning this test can be used) for the duration of the COVID-19 declaration under Section 564(b)(1) of the Act, 21 U.S.C. section 360bbb-3(b)(1), unless the authorization is terminated or revoked.  Performed at [facility], [place] [address] [place], [place] [address]   Blood Culture (routine x 2)     Status: None   Collection Time: 09/27/20 12:31 AM   Specimen: BLOOD  Result Value Ref Range Status   Specimen Description BLOOD SITE NOT SPECIFIED  Final   Special Requests    Final    BOTTLES DRAWN AEROBIC AND ANAEROBIC Blood Culture adequate volume   Culture   Final    NO GROWTH 5 DAYS Performed at [facility], [address], [place], [place] [address]    Report Status 10/02/2020 FINAL  Final  MRSA PCR Screening     Status: None   Collection Time: 09/28/20  4:14 AM   Specimen: Nasal Mucosa; Nasopharyngeal  Result Value Ref Range Status   MRSA by PCR NEGATIVE NEGATIVE Final    Comment:        The GeneXpert MRSA Assay (FDA approved for NASAL specimens only), is one component of a comprehensive MRSA colonization surveillance program. It is not intended to diagnose MRSA infection nor to guide or monitor treatment for MRSA infections. Performed at [facility], [place] [address] [place], [place] [address]     Radiology Reports CT ANGIO CHEST PE W OR WO CONTRAST  Result Date: 09/27/2020 CLINICAL DATA:  54 year old male with history of COVID-19, suspected pulmonary embolism. EXAM: CT ANGIOGRAPHY CHEST WITH CONTRAST TECHNIQUE: Multidetector CT imaging of the chest was performed using the standard protocol during bolus administration of intravenous contrast. Multiplanar CT image reconstructions and MIPs were obtained to evaluate the vascular anatomy. CONTRAST:  100 mL Omnipaque 350, intravenous COMPARISON:  Chest CT from 11/17/2019 FINDINGS: Cardiovascular: Satisfactory opacification of the pulmonary arteries to the segmental level. No evidence of pulmonary embolism. Normal heart size. No pericardial effusion. The heart is normal in size. No pericardial effusion. Severe coronary atherosclerotic calcifications. Mediastinum/Nodes: Similar appearing prominent right paratracheal lymph node measuring up to 1.2 cm in short axis. Otherwise no enlarged mediastinal, hilar, or axillary lymph nodes. Thyroid gland, trachea, and esophagus demonstrate no significant findings. Lungs/Pleura: Interval development of near diffuse, peripherally predominant ground-glass  pulmonary opacities with a mid lung and basal predominance. Similar appearing moderate upper lobe predominant paraseptal and centrilobular emphysema. No pleural effusion or pneumothorax. Upper Abdomen: The visualized upper abdomen is within normal limits. Musculoskeletal: No chest wall abnormality. No acute or significant osseous findings. Review of the MIP images confirms the above findings. IMPRESSION: Vascular: 1. No evidence of pulmonary embolism. 2. Severe coronary atherosclerotic calcifications. Non-Vascular: 1. Diffuse, mid lung and basilar, peripherally predominant ground-glass opacities - findings compatible with multifocal pneumonia associated with COVID-19. 2. Similar appearing moderate upper lobe predominant centrilobular and paraseptal emphysema. Electronically Signed   By: Ruthann Cancer MD   On: 09/27/2020 14:31   DG CHEST PORT 1 VIEW  Result Date: 09/26/2020 CLINICAL DATA:  COVID-19 EXAM: PORTABLE CHEST 1 VIEW COMPARISON:  09/26/2020 FINDINGS: Slight worsening of bilateral basilar predominant opacities. No pleural effusion or pneumothorax. Normal cardiomediastinal contours. IMPRESSION: Slight worsening of bilateral basilar predominant opacities. Electronically Signed   By: Ulyses Jarred M.D.   On: 09/26/2020 23:26   DG Chest Portable 1 View  Result Date: 09/26/2020 CLINICAL DATA:  Cough and shortness of breath. EXAM: PORTABLE CHEST 1 VIEW COMPARISON:  12/04/2019 FINDINGS: Cardiomegaly which is increased from prior exam. Pulmonary edema, moderate in degree. There are bilateral pleural effusions. No confluent consolidation. No pneumothorax. No acute osseous abnormalities are seen. IMPRESSION: CHF with cardiomegaly, pulmonary edema and bilateral pleural effusions. Electronically Signed   By: Keith Rake M.D.   On: 09/26/2020 19:14   ECHOCARDIOGRAM LIMITED  Result Date: 09/28/2020    ECHOCARDIOGRAM LIMITED REPORT   Patient  Name:   Lovett C Metzger Date of Exam: 09/28/2020 Medical Rec  #:  [id number]          Height:       71.0 in Accession #:    [id number]         Weight:       244.3 lb Date of Birth:  04/14/1966          BSA:          2.296 m Patient Age:    71 years           BP:           120/84 mmHg Patient Gender: M                  HR:           73 bpm. Exam Location:  Inpatient Procedure: Limited Echo, Limited Color Doppler and Cardiac Doppler Indications:    acute respiratory distress  History:        Patient has prior history of Echocardiogram examinations, most                 recent 03/30/2016. CAD; Covid.  Sonographer:    Johny Chess Referring Phys: [id number] Candace Gallus MELVIN  Sonographer Comments: Image acquisition challenging due to respiratory motion. IMPRESSIONS  1. Left ventricular ejection fraction, by estimation, is 60 to 65%. The left ventricle has normal function. The left ventricle has no regional wall motion abnormalities. There is mild left ventricular hypertrophy.  2. Right ventricular systolic function is normal. The right ventricular size is normal.  3. The mitral valve is normal in structure. No evidence of mitral valve regurgitation. No evidence of mitral stenosis.  4. The aortic valve is tricuspid.  5. The inferior vena cava is normal in size with greater than 50% respiratory variability, suggesting right atrial pressure of 3 mmHg. FINDINGS  Left Ventricle: Left ventricular ejection fraction, by estimation, is 60 to 65%. The left ventricle has normal function. The left ventricle has no regional wall motion abnormalities. The left ventricular internal cavity size was normal in size. There is  mild left ventricular hypertrophy. Right Ventricle: The right ventricular size is normal. No increase in right ventricular wall thickness. Right ventricular systolic function is normal. Pericardium: There is no evidence of pericardial effusion. Mitral Valve: The mitral valve is normal in structure. No evidence of mitral valve stenosis. Tricuspid Valve: The tricuspid valve is  normal in structure. Tricuspid valve regurgitation is not demonstrated. No evidence of tricuspid stenosis. Aortic Valve: The aortic valve is tricuspid. Pulmonic Valve: The pulmonic valve was not well visualized. Pulmonic valve regurgitation is not visualized. No evidence of pulmonic stenosis. Aorta: The aortic root is normal in size and structure. Pulmonary Artery: Indeterminant PASP, inadequate TR jet. Venous: The inferior vena cava is normal in size with greater than 50% respiratory variability, suggesting right atrial pressure of 3 mmHg. IAS/Shunts: No atrial level shunt detected by color flow Doppler. LEFT VENTRICLE PLAX 2D LVIDd:         4.20 cm LVIDs:         2.80 cm LV PW:         1.30 cm LV IVS:        1.10 cm LVOT diam:     2.00 cm LV SV:         71 LV SV Index:   31 LVOT Area:     3.14 cm  IVC IVC diam: 1.70 cm LEFT  ATRIUM         Index LA diam:    4.30 cm 1.87 cm/m  AORTIC VALVE LVOT Vmax:   116.00 cm/s LVOT Vmean:  76.100 cm/s LVOT VTI:    0.227 m  AORTA Ao Root diam: 3.10 cm MITRAL VALVE MV Area (PHT): 2.91 cm    SHUNTS MV Decel Time: 261 msec    Systemic VTI:  0.23 m MV E velocity: 75.40 cm/s  Systemic Diam: 2.00 cm MV A velocity: 66.00 cm/s MV E/A ratio:  1.14 Carlyle Dolly MD Electronically signed by Carlyle Dolly MD Signature Date/Time: 09/28/2020/2:52:33 PM    Final     Lab Data:  CBC: Recent Labs  Lab 09/27/20 0432 09/28/20 0007 09/29/20 0422 09/30/20 0430 10/01/20 0119 10/02/20 0057 10/03/20 0112  WBC 5.2 4.3 9.7 10.7* 12.2* 13.5* 13.6*  NEUTROABS 4.7 3.3 9.2* 9.2* 11.0*  --   --   HGB 15.6 15.1 15.0 14.3 14.6 15.5 15.1  HCT 44.4 44.5 44.4 42.5 41.7 43.5 41.6  MCV 88.6 88.5 89.5 90.4 87.8 88.2 87.0  PLT 90* 115* 171 184 204 238 [id number]   Basic Metabolic Panel: Recent Labs  Lab 09/27/20 0432 09/28/20 0007 09/29/20 0422 09/30/20 0430 10/01/20 0119 10/02/20 0057 10/03/20 0112  NA 133* 133* 137 135 134* 134* 132*  K 4.3 3.9 4.3 4.4 4.9 4.8 5.0  CL 101 99 99 101  104 104 102  CO2 21* 22 24 24  20* 19* 18*  GLUCOSE 174* 171* 180* 185* 169* 158* 179*  BUN 23* 25* 33* 33* 28* 27* 28*  CREATININE 1.11 0.95 1.13 1.08 1.03 0.97 0.93  CALCIUM 7.7* 8.0* 8.4* 8.2* 8.3* 8.5* 8.5*  MG 2.7* 2.8* 3.0* 2.8* 2.8*  --   --   PHOS 3.1 2.9 3.0 3.5 2.7  --   --    GFR: Estimated Creatinine Clearance: 110.8 mL/min (by C-G formula based on SCr of 0.93 mg/dL). Liver Function Tests: Recent Labs  Lab 09/29/20 0422 09/30/20 0430 10/01/20 0119 10/02/20 0057 10/03/20 0112  AST 88* 68* 51* 26 18  ALT 133* 132* 131* 95* 67*  ALKPHOS 109 106 101 105 90  BILITOT 1.0 1.1 1.5* 1.2 1.4*  PROT 6.8 6.5 6.3* 6.6 6.5  ALBUMIN 2.9* 2.8* 2.9* 3.0* 2.9*   No results for input(s): LIPASE, AMYLASE in the last 168 hours. No results for input(s): AMMONIA in the last 168 hours. Coagulation Profile: No results for input(s): INR, PROTIME in the last 168 hours. Cardiac Enzymes: No results for input(s): CKTOTAL, CKMB, CKMBINDEX, TROPONINI in the last 168 hours. BNP (last 3 results) No results for input(s): PROBNP in the last 8760 hours. HbA1C: No results for input(s): HGBA1C in the last 72 hours. CBG: No results for input(s): GLUCAP in the last 168 hours. Lipid Profile: No results for input(s): CHOL, HDL, LDLCALC, TRIG, CHOLHDL, LDLDIRECT in the last 72 hours. Thyroid Function Tests: No results for input(s): TSH, T4TOTAL, FREET4, T3FREE, THYROIDAB in the last 72 hours. Anemia Panel: Recent Labs    10/01/20 0119  FERRITIN 2,102*   Urine analysis: No results found for: COLORURINE, APPEARANCEUR, LABSPEC, PHURINE, GLUCOSEU, HGBUR, BILIRUBINUR, KETONESUR, PROTEINUR, UROBILINOGEN, NITRITE, Louie Boston Ocie Stanzione M.D. [facility] 10/03/2020, 1:52 PM   Call night coverage person covering after 7pm

## 2020-10-03 NOTE — Progress Notes (Signed)
Physical Therapy Treatment Patient Details Name: JOATHAN CACCHIONE MRN: [id number] DOB: 09/11/1966 Today's Date: 10/03/2020    History of Present Illness Pt is a 54 y.o. male admitted 09/26/20 with SOB and GI distress (had declined COVID test when seen in urgent care). Pt now testing (+) COVID-19. Workup for acute hypoxic respiratory failure due to acute COVID-19 viral PNA. PMH includes CAD, CHF, tobacco use (quit 11/2019); pt unvaccinated.   PT Comments    Pt with worsening respiratory status this session. Tolerated short bout of standing activity with SpO2 down to 78% on 15L O2 HFNC, DOE 4/4; pt requiring significant time to recover SOB with SpO2 slowly back up to mid-80s. Pt continues to demonstrate poor insight into functional status, respiratory needs and disease trajectory. Pt reports numbers on telemonitor are wrong and frustrated RN asked him to use NRB to aid recovery; receptive to education regarding these things, but still understandably frustrated by current status. Pt continues to sit upright OOB and mobilize as much as possible throughout day. Will continue to follow acutely.   Follow Up Recommendations  No PT follow up (pending progression)     Equipment Recommendations  None recommended by PT    Recommendations for Other Services       Precautions / Restrictions Precautions Precautions: Fall;Other (comment) Precaution Comments: Watch SpO2 - desat to 78% on 15L O2 HFNC with mobility, does not like using NRB Restrictions Weight Bearing Restrictions: No    Mobility  Bed Mobility               General bed mobility comments: Received sitting in recliner  Transfers Overall transfer level: Needs assistance Equipment used: Rolling walker (2 wheeled) Transfers: Sit to/from Stand Sit to Stand: Supervision         General transfer comment: Pt requesting use of RW for stability; supervision for safety  Ambulation/Gait Ambulation/Gait assistance:  Supervision Gait Distance (Feet): 40 Feet Assistive device: Rolling walker (2 wheeled) Gait Pattern/deviations: Step-through pattern;Trunk flexed Gait velocity: Decreased   General Gait Details: Slow, labored but mostly steady gait with RW and supervision for safety/lines; DOE 3-4/4 requiring cues for pursed lip breathing; prolonged seated rest, pt reports unable to do anymore activity due to significant SOB. SpO2 down to 78% on 15L O2 HFNC, slowly recovering to [employer] Mobility    Modified Rankin (Stroke Patients Only)       Balance Overall balance assessment: Needs assistance   Sitting balance-Leahy Scale: Good       Standing balance-Leahy Scale: Fair                              Cognition Arousal/Alertness: Awake/alert Behavior During Therapy: WFL for tasks assessed/performed (frustrated) Overall Cognitive Status: Within Functional Limits for tasks assessed                                        Exercises      General Comments General comments (skin integrity, edema, etc.): Appears frustrated with being in the hospital and does not like the food; asking questions like, "What else can I be doing to get out of here faster?" Receptive to education      Pertinent Vitals/Pain Pain Assessment: No/denies pain    Home Living  Prior Function            PT Goals (current goals can now be found in the care plan section) Progress towards PT goals: Not progressing toward goals - comment (worsening respiratory status)    Frequency    Min 3X/week      PT Plan Current plan remains appropriate    Co-evaluation              AM-PAC PT "6 Clicks" Mobility   Outcome Measure  Help needed turning from your back to your side while in a flat bed without using bedrails?: None Help needed moving from lying on your back to sitting on the side of a flat bed without using  bedrails?: None Help needed moving to and from a bed to a chair (including a wheelchair)?: A Little Help needed standing up from a chair using your arms (e.g., wheelchair or bedside chair)?: A Little Help needed to walk in hospital room?: A Little Help needed climbing 3-5 steps with a railing? : A Little 6 Click Score: 20    End of Session Equipment Utilized During Treatment: Oxygen Activity Tolerance: Treatment limited secondary to medical complications (Comment) Patient left: in chair;with call bell/phone within reach Nurse Communication: Mobility status PT Visit Diagnosis: Other abnormalities of gait and mobility (R26.89)     Time: 2671-2458 PT Time Calculation (min) (ACUTE ONLY): 21 min  Charges:  $Therapeutic Exercise: 8-22 mins                     Mabeline Caras, PT, DPT Acute Rehabilitation Services  Pager [phone] Office [place] 10/03/2020, 5:18 PM

## 2020-10-03 NOTE — Progress Notes (Signed)
CCMD called and informed that pt SpO2 was dropping to 81%.  Pt sleeping on side but he is a mouth breather so oxygen flow increased to 11L then pt called and stated flow was too high.  I explained the reason for increasing the oxygen level and pt understood. Left on 9L oxygen HFNC.

## 2020-10-03 NOTE — Plan of Care (Signed)
  Problem: Education: Goal: Knowledge of risk factors and measures for prevention of condition will improve Outcome: Progressing   Problem: Coping: Goal: Psychosocial and spiritual needs will be supported Outcome: Progressing   Problem: Respiratory: Goal: Will maintain a patent airway Outcome: Progressing Goal: Complications related to the disease process, condition or treatment will be avoided or minimized Outcome: Progressing   

## 2020-10-04 LAB — CBC
HCT: 43.6 % (ref 39.0–52.0)
Hemoglobin: 14.8 g/dL (ref 13.0–17.0)
MCH: 30.2 pg (ref 26.0–34.0)
MCHC: 33.9 g/dL (ref 30.0–36.0)
MCV: 89 fL (ref 80.0–100.0)
Platelets: 239 10*3/uL (ref 150–400)
RBC: 4.9 MIL/uL (ref 4.22–5.81)
RDW: 13.3 % (ref 11.5–15.5)
WBC: 12.4 10*3/uL — ABNORMAL HIGH (ref 4.0–10.5)
nRBC: 0 % (ref 0.0–0.2)

## 2020-10-04 LAB — COMPREHENSIVE METABOLIC PANEL
ALT: 58 U/L — ABNORMAL HIGH (ref 0–44)
AST: 18 U/L (ref 15–41)
Albumin: 2.8 g/dL — ABNORMAL LOW (ref 3.5–5.0)
Alkaline Phosphatase: 84 U/L (ref 38–126)
Anion gap: 9 (ref 5–15)
BUN: 30 mg/dL — ABNORMAL HIGH (ref 6–20)
CO2: 18 mmol/L — ABNORMAL LOW (ref 22–32)
Calcium: 8.2 mg/dL — ABNORMAL LOW (ref 8.9–10.3)
Chloride: 105 mmol/L (ref 98–111)
Creatinine, Ser: 1.1 mg/dL (ref 0.61–1.24)
GFR, Estimated: >60 mL/min (ref >60)
Glucose, Bld: 194 mg/dL — ABNORMAL HIGH (ref 70–99)
Potassium: 5.1 mmol/L (ref 3.5–5.1)
Sodium: 132 mmol/L — ABNORMAL LOW (ref 135–145)
Total Bilirubin: 1.4 mg/dL — ABNORMAL HIGH (ref 0.3–1.2)
Total Protein: 6.3 g/dL — ABNORMAL LOW (ref 6.5–8.1)

## 2020-10-04 LAB — D-DIMER, QUANTITATIVE: D-Dimer, Quant: 1.93 ug/mL-FEU — ABNORMAL HIGH (ref 0.00–0.50)

## 2020-10-04 MED ORDER — GUAIFENESIN-DM 100-10 MG/5ML PO SYRP
10.0000 mL | ORAL_SOLUTION | ORAL | Status: DC | PRN
  Administered 2020-10-04 – 2020-10-08 (×7): 10 mL via ORAL
  Filled 2020-10-04 (×7): qty 10

## 2020-10-04 NOTE — Progress Notes (Signed)
Occupational Therapy Treatment Patient Details Name: Matthew [name] MRN: [id number] DOB: 10-Jun-1966 Today's Date: 10/04/2020    History of present illness Pt is a 54 y.o. male admitted 09/26/20 with SOB and GI distress (had declined COVID test when seen in urgent care). Pt now testing (+) COVID-19. Workup for acute hypoxic respiratory failure due to acute COVID-19 viral PNA. PMH includes CAD, CHF, tobacco use (quit 11/2019); pt unvaccinated.   OT comments  Pt on 9L HFNC on entry with SpO2 between 86-88. Pt appears to be a mouth breather. Used 15L NRB to ambulate to door and back to recliner without stopping with SpO2 desat to 74 with increased WOB. Prolonged time to rebound and required continued use of NRB to reach 90 after several minutes. Pt left on 11L HFNC with SpO2 86-88 - nsg and MD made aware. Pt frustrated with his need for oxygen and is really wanting to go home next week. Will continue to follow acutely.   Follow Up Recommendations  No OT follow up;Supervision - Intermittent    Equipment Recommendations  Tub/shower seat    Recommendations for Other Services      Precautions / Restrictions Precautions Precaution Comments: watch O2 sats Restrictions Weight Bearing Restrictions: No       Mobility Bed Mobility                  Transfers Overall transfer level: Needs assistance   Transfers: Sit to/from Stand Sit to Stand: Supervision              Balance                                           ADL either performed or assessed with clinical judgement   ADL                                       Functional mobility during ADLs: Min guard;Supervision/safety General ADL Comments: Had completed bath at seated level wtih nurse tech; "I got winded"; Will benefit form energy conservation     Vision       Perception     Praxis      Cognition Arousal/Alertness: Awake/alert Behavior During Therapy: WFL for tasks  assessed/performed Overall Cognitive Status: Within Functional Limits for tasks assessed                                 General Comments: appears frustrated with situation        Exercises Other Exercises Other Exercises: pursed lip breathing; pt is a mouth breather Other Exercises: pt states he is pulling between 750 - 1200 ml   Shoulder Instructions       General Comments      Pertinent Vitals/ Pain       Pain Assessment: Faces Faces Pain Scale: Hurts a little bit Pain Location:  (disocmfort with SOB) Pain Descriptors / Indicators: Discomfort Pain Intervention(s): Limited activity within patient's tolerance  Home Living                                          Prior Functioning/Environment  Frequency  Min 3X/week        Progress Toward Goals  OT Goals(current goals can now be found in the care plan section)  Progress towards OT goals: Progressing toward goals  Acute Rehab OT Goals Patient Stated Goal: to go home next week OT Goal Formulation: With patient Time For Goal Achievement: 10/16/20 Potential to Achieve Goals: Good ADL Goals Pt Will Perform Lower Body Bathing: with modified independence;sit to/from stand Pt Will Perform Lower Body Dressing: with modified independence;sit to/from stand Pt Will Transfer to Toilet: with modified independence;ambulating Pt/caregiver will Perform Home Exercise Program: Increased strength;Both right and left upper extremity;With written HEP provided;Independently;With theraband Additional ADL Goal #1: Pt will independently verbalize 3 energy conservation strategies Additional ADL Goal #2: Pt will maintain SpO2 greater than 88 during functional activity  Plan Discharge plan remains appropriate    Co-evaluation                 AM-PAC OT "6 Clicks" Daily Activity     Outcome Measure   Help from another person eating meals?: None Help from another person taking care  of personal grooming?: A Little Help from another person toileting, which includes using toliet, bedpan, or urinal?: A Little Help from another person bathing (including washing, rinsing, drying)?: A Little Help from another person to put on and taking off regular upper body clothing?: A Little Help from another person to put on and taking off regular lower body clothing?: A Little 6 Click Score: 19    End of Session Equipment Utilized During Treatment: Oxygen (9L - 15L NRB)  OT Visit Diagnosis: Unsteadiness on feet (R26.81);Other abnormalities of gait and mobility (R26.89);Muscle weakness (generalized) (M62.81);Dizziness and giddiness (R42)   Activity Tolerance Patient tolerated treatment well   Patient Left in chair;with call bell/phone within reach   Nurse Communication Other (comment);Mobility status (O2 needs)        Time: 0950-1020 OT Time Calculation (min): 30 min  Charges: OT General Charges $OT Visit: 1 Visit OT Treatments $Self Care/Home Management : 23-37 mins  Maurie Boettcher, OT/L   Acute OT Clinical Specialist [place] Pager [phone] Office [phone]    [facility] 10/04/2020, 12:52 PM

## 2020-10-04 NOTE — Progress Notes (Signed)
[facility]                                                                              Patient Demographics  Matthew [name], is a 54 y.o. male, DOB - 1966-03-02, [id number]  Admit date - 09/26/2020   Admitting Physician Marcelyn Bruins, MD  Outpatient Primary MD for the patient is Patient, No Pcp Per  Outpatient specialists:   LOS - 8  days   Medical records reviewed and are as summarized below:    No chief complaint on file.      Brief summary   Patient is a 54 year old male with history of CAD status post stent x2, tobacco use (78-pack-year history, quit in February, 2021),  HLP, GERD, presented with shortness of breath.  Patient had gradual worsening of shortness of breath since 12/9 and GI illness from around 12/10-12/14.  He was seen at the urgent care for diarrhea, nausea and vomiting.  Patient was treated with fluids and supportive care, had declined Covid test at that time.  As shortness of breath progressively worsened, patient called the EMS, was found to have O2 sats 78% on room air with significant rails.  Patient was placed on 15 L NRB with improvement to 90%.  Vaccination status: Patient is unvaccinated for COVID-19  In ED, patient was tachypneic with respiratory rate 20s to 30s, requiring 6 to 10 L O2 via [place].  Creatinine 1.3, AST 103, ALT 64.  Platelets 88. Covid test positive. Chest x-ray showed cardiomegaly with some pulmonary edema and effusions.  Patient was given a dose of Lasix in ED and was started on IV steroids and remdesivir  Subjective:   Matthew [name] was seen and examined today.  Reports he had a good night sleep, report cough and dyspnea has improved as well, he denies any fever or chest pain.  Assessment & Plan   Acute hypoxic respiratory failure due to acute COVID-19 viral pneumonia during the ongoing COVID-19 pandemic- POA -He is unvaccinated - Patient presented with worsening shortness of breath, found to be  hypoxic, chest x-ray showed pulmonary edema with effusions, Covid test positive, unvaccinated.   - Continue IV Solu-Medrol, 60 mg every 12 hours. -Treated with IV remdesivir . -Continue with baricitinib  - CT angiogram chest negative for PE - Continue Supportive care: vitamin C/zinc, albuterol, Tylenol, I-S, flutter valve, proning advised. -Due to severe Covid, significant oxygen requirement, this has been improving, this morning he is on 11 L high flow nasal cannula. -Encouraged use incentive spirometry, flutter valve, to prone and out of bed to chair.  - Oxygen - SpO2: 90 % O2 Flow Rate (L/min): 10 L/min FiO2 (%): (!) 15 % HFNC - Continue to follow labs as below  Lab Results  Component Value Date   SARSCOV2NAA POSITIVE (A) 09/26/2020   SARSCOV2NAA NEGATIVE 02/06/2020   [place] NEGATIVE 11/17/2019     Recent Labs  Lab 09/28/20 0007 09/29/20 0422 09/30/20 0430 10/01/20 0119 10/02/20 0057 10/03/20 0112 10/04/20 0030  DDIMER 2.37* 1.49* 1.44* 1.76* 1.57* 2.36* 1.93*  FERRITIN 3,088* 2,958* 2,100* 2,102*  --   --   --   CRP  11.9* 3.6* 1.4* 0.9  --   --   --   ALT 114* 133* 132* 131* 95* 67* 58*   Acute CHF with a preserved EF, with cardiomegaly, pulmonary edema, bilateral pleural effusion -Received Lasix 60 mg IV x1 in ED, BNP 23.4, troponin 9 -2D echo with a preserved EF -Initially on scheduled Lasix, but he does appear to be a euvolemic currently, so he is on Lasix as needed.   History of CAD status post stents x2 -Troponins negative, currently no chest pain, not on any home medications for this  Acute on chronic thrombocytopenia -Possibly worsened due to COVID-19, platelets 88K on admission, resolved  Transaminitis -Due to Covid, stable, monitor closely.  GERD Continue PPI I  Mild acute kidney injury  -Resolved  History of hemoptysis complicated by epistaxis in 11/2019 -Follows Dr. Inez Catalina, was found to have clot in LUL bronchus  Constipation - he  was encouraged to take his laxatives.  Obesity Estimated body mass index is 33.18 kg/m as calculated from the following:   Height as of this encounter: 5\' 11"  (1.803 m).   Weight as of this encounter: 107.9 kg.  Code Status: Full CODE STATUS DVT Prophylaxis: Lovenox Family Communication: Discussed with patient's wife patient's wife, Torrance Hon [phone] (she is also covid positive) by phone daily.  Disposition Plan:     Status is: Inpatient  Remains inpatient appropriate because:Inpatient level of care appropriate due to severity of illness   Dispo: The patient is from: Home              Anticipated d/c is to: Home              Anticipated d/c date is: > 3 days              Patient currently is not medically stable to d/c. \ Procedures:  2D echo  Consultants:   None  Antimicrobials:   Anti-infectives (From admission, onward)   Start     Dose/Rate Route Frequency Ordered Stop   09/27/20 1000  remdesivir 100 mg in sodium chloride 0.9 % 100 mL IVPB       "Followed by" Linked Group Details   100 mg 200 mL/hr over 30 Minutes Intravenous Daily 09/26/20 2208 09/30/20 1900   09/26/20 2215  remdesivir 200 mg in sodium chloride 0.9% 250 mL IVPB       "Followed by" Linked Group Details   200 mg 580 mL/hr over 30 Minutes Intravenous Once 09/26/20 2208 09/27/20 0146         Medications  Scheduled Meds: . baricitinib  4 mg Oral Daily  . enoxaparin (LOVENOX) injection  55 mg Subcutaneous Daily  . feeding supplement  237 mL Oral BID BM  . melatonin  3 mg Oral QHS  . methylPREDNISolone (SOLU-MEDROL) injection  60 mg Intravenous BID  . pantoprazole  40 mg Oral Daily  . polyethylene glycol  17 g Oral Once  . sodium chloride flush  3 mL Intravenous Q12H   Continuous Infusions:  PRN Meds:.acetaminophen, ondansetron (ZOFRAN) IV, polyethylene glycol, sodium chloride        Objective:   Vitals:   10/03/20 2010 10/04/20 0009 10/04/20 0346 10/04/20 1205  BP:  123/84 102/87 98/61 109/90  Pulse: 86 69 75 84  Resp: 18 18 18 20   Temp: (!) 97.5 F (36.4 C) (!) 97.4 F (36.3 C) (!) 97.2 F (36.2 C) 98.1 F (36.7 C)  TempSrc: Oral Oral Axillary Axillary  SpO2: 96% (!) 86% 93%  90%  Weight:      Height:        Intake/Output Summary (Last 24 hours) at 10/04/2020 1317 Last data filed at 10/04/2020 1213 Gross per 24 hour  Intake 480 ml  Output 650 ml  Net -170 ml     Wt Readings from Last 3 Encounters:  10/03/20 107.9 kg  12/04/19 108.4 kg  11/20/19 107.9 kg    Physical Exam  Awake Alert, Oriented X 3, No new F.N deficits, Normal affect Symmetrical Chest wall movement, Good air movement bilaterally, CTAB RRR,No Gallops,Rubs or new Murmurs, No Parasternal Heave +ve B.Sounds, Abd Soft, No tenderness, No rebound - guarding or rigidity. No Cyanosis, Clubbing or edema, No new Rash or bruise       Data Reviewed:  I have personally reviewed following labs and imaging studies  Micro Results Recent Results (from the past 240 hour(s))  Resp Panel by RT-PCR (Flu A&B, Covid) Nasopharyngeal Swab     Status: Abnormal   Collection Time: 09/26/20  7:03 PM   Specimen: Nasopharyngeal Swab; Nasopharyngeal(NP) swabs in vial transport medium  Result Value Ref Range Status   SARS Coronavirus 2 by RT PCR POSITIVE (A) NEGATIVE Final    Comment: RESULT CALLED TO, READ BACK BY AND VERIFIED WITH: G TATE RN 09/26/20 2143 JDW (NOTE) SARS-CoV-2 target nucleic acids are DETECTED.  The SARS-CoV-2 RNA is generally detectable in upper respiratory specimens during the acute phase of infection. Positive results are indicative of the presence of the identified virus, but do not rule out bacterial infection or co-infection with other pathogens not detected by the test. Clinical correlation with patient history and other diagnostic information is necessary to determine patient infection status. The expected result is Negative.  Fact Sheet for  Patients: EntrepreneurPulse.com.au  Fact Sheet for Healthcare Providers: IncredibleEmployment.be  This test is not yet approved or cleared by the [place] FDA and  has been authorized for detection and/or diagnosis of SARS-CoV-2 by FDA under an Emergency Use Authorization (EUA).  This EUA will remain in effect (meaning this test can be used)  for the duration of  the COVID-19 declaration under Section 564(b)(1) of the Act, 21 U.S.C. section 360bbb-3(b)(1), unless the authorization is terminated or revoked sooner.     Influenza A by PCR NEGATIVE NEGATIVE Final   Influenza B by PCR NEGATIVE NEGATIVE Final    Comment: (NOTE) The Xpert Xpress SARS-CoV-2/FLU/RSV plus assay is intended as an aid in the diagnosis of influenza from Nasopharyngeal swab specimens and should not be used as a sole basis for treatment. Nasal washings and aspirates are unacceptable for Xpert Xpress SARS-CoV-2/FLU/RSV testing.  Fact Sheet for Patients: EntrepreneurPulse.com.au  Fact Sheet for Healthcare Providers: IncredibleEmployment.be  This test is not yet approved or cleared by the [place] FDA and has been authorized for detection and/or diagnosis of SARS-CoV-2 by FDA under an Emergency Use Authorization (EUA). This EUA will remain in effect (meaning this test can be used) for the duration of the COVID-19 declaration under Section 564(b)(1) of the Act, 21 U.S.C. section 360bbb-3(b)(1), unless the authorization is terminated or revoked.  Performed at [facility], [place] [address], [place], [place] [address]   Blood Culture (routine x 2)     Status: None   Collection Time: 09/27/20 12:31 AM   Specimen: BLOOD  Result Value Ref Range Status   Specimen Description BLOOD SITE NOT SPECIFIED  Final   Special Requests   Final    BOTTLES DRAWN AEROBIC AND  ANAEROBIC Blood Culture adequate volume   Culture   Final    NO  GROWTH 5 DAYS Performed at [facility], [place] [address] [place], [place] [address]    Report Status 10/02/2020 FINAL  Final  MRSA PCR Screening     Status: None   Collection Time: 09/28/20  4:14 AM   Specimen: Nasal Mucosa; Nasopharyngeal  Result Value Ref Range Status   MRSA by PCR NEGATIVE NEGATIVE Final    Comment:        The GeneXpert MRSA Assay (FDA approved for NASAL specimens only), is one component of a comprehensive MRSA colonization surveillance program. It is not intended to diagnose MRSA infection nor to guide or monitor treatment for MRSA infections. Performed at [facility], [place] [address] [place], [place] [address]     Radiology Reports CT ANGIO CHEST PE W OR WO CONTRAST  Result Date: 09/27/2020 CLINICAL DATA:  54 year old male with history of COVID-19, suspected pulmonary embolism. EXAM: CT ANGIOGRAPHY CHEST WITH CONTRAST TECHNIQUE: Multidetector CT imaging of the chest was performed using the standard protocol during bolus administration of intravenous contrast. Multiplanar CT image reconstructions and MIPs were obtained to evaluate the vascular anatomy. CONTRAST:  100 mL Omnipaque 350, intravenous COMPARISON:  Chest CT from 11/17/2019 FINDINGS: Cardiovascular: Satisfactory opacification of the pulmonary arteries to the segmental level. No evidence of pulmonary embolism. Normal heart size. No pericardial effusion. The heart is normal in size. No pericardial effusion. Severe coronary atherosclerotic calcifications. Mediastinum/Nodes: Similar appearing prominent right paratracheal lymph node measuring up to 1.2 cm in short axis. Otherwise no enlarged mediastinal, hilar, or axillary lymph nodes. Thyroid gland, trachea, and esophagus demonstrate no significant findings. Lungs/Pleura: Interval development of near diffuse, peripherally predominant ground-glass pulmonary opacities with a mid lung and basal predominance. Similar appearing moderate upper lobe  predominant paraseptal and centrilobular emphysema. No pleural effusion or pneumothorax. Upper Abdomen: The visualized upper abdomen is within normal limits. Musculoskeletal: No chest wall abnormality. No acute or significant osseous findings. Review of the MIP images confirms the above findings. IMPRESSION: Vascular: 1. No evidence of pulmonary embolism. 2. Severe coronary atherosclerotic calcifications. Non-Vascular: 1. Diffuse, mid lung and basilar, peripherally predominant ground-glass opacities - findings compatible with multifocal pneumonia associated with COVID-19. 2. Similar appearing moderate upper lobe predominant centrilobular and paraseptal emphysema. Electronically Signed   By: Ruthann Cancer MD   On: 09/27/2020 14:31   DG CHEST PORT 1 VIEW  Result Date: 09/26/2020 CLINICAL DATA:  COVID-19 EXAM: PORTABLE CHEST 1 VIEW COMPARISON:  09/26/2020 FINDINGS: Slight worsening of bilateral basilar predominant opacities. No pleural effusion or pneumothorax. Normal cardiomediastinal contours. IMPRESSION: Slight worsening of bilateral basilar predominant opacities. Electronically Signed   By: Ulyses Jarred M.D.   On: 09/26/2020 23:26   DG Chest Portable 1 View  Result Date: 09/26/2020 CLINICAL DATA:  Cough and shortness of breath. EXAM: PORTABLE CHEST 1 VIEW COMPARISON:  12/04/2019 FINDINGS: Cardiomegaly which is increased from prior exam. Pulmonary edema, moderate in degree. There are bilateral pleural effusions. No confluent consolidation. No pneumothorax. No acute osseous abnormalities are seen. IMPRESSION: CHF with cardiomegaly, pulmonary edema and bilateral pleural effusions. Electronically Signed   By: Keith Rake M.D.   On: 09/26/2020 19:14   ECHOCARDIOGRAM LIMITED  Result Date: 09/28/2020    ECHOCARDIOGRAM LIMITED REPORT   Patient Name:   Dashun Loletha Grayer [place] Date of Exam: 09/28/2020 Medical Rec #:  [id number]          Height:  71.0 in Accession #:    [id number]         Weight:        244.3 lb Date of Birth:  [birth year]          BSA:          2.296 m Patient Age:    25 years           BP:           120/84 mmHg Patient Gender: M                  HR:           73 bpm. Exam Location:  Inpatient Procedure: Limited Echo, Limited Color Doppler and Cardiac Doppler Indications:    acute respiratory distress  History:        Patient has prior history of Echocardiogram examinations, most                 recent 03/30/2016. CAD; Covid.  Sonographer:    Johny Chess Referring Phys: [id number] Candace Gallus MELVIN  Sonographer Comments: Image acquisition challenging due to respiratory motion. IMPRESSIONS  1. Left ventricular ejection fraction, by estimation, is 60 to 65%. The left ventricle has normal function. The left ventricle has no regional wall motion abnormalities. There is mild left ventricular hypertrophy.  2. Right ventricular systolic function is normal. The right ventricular size is normal.  3. The mitral valve is normal in structure. No evidence of mitral valve regurgitation. No evidence of mitral stenosis.  4. The aortic valve is tricuspid.  5. The inferior vena cava is normal in size with greater than 50% respiratory variability, suggesting right atrial pressure of 3 mmHg. FINDINGS  Left Ventricle: Left ventricular ejection fraction, by estimation, is 60 to 65%. The left ventricle has normal function. The left ventricle has no regional wall motion abnormalities. The left ventricular internal cavity size was normal in size. There is  mild left ventricular hypertrophy. Right Ventricle: The right ventricular size is normal. No increase in right ventricular wall thickness. Right ventricular systolic function is normal. Pericardium: There is no evidence of pericardial effusion. Mitral Valve: The mitral valve is normal in structure. No evidence of mitral valve stenosis. Tricuspid Valve: The tricuspid valve is normal in structure. Tricuspid valve regurgitation is not demonstrated. No evidence of  tricuspid stenosis. Aortic Valve: The aortic valve is tricuspid. Pulmonic Valve: The pulmonic valve was not well visualized. Pulmonic valve regurgitation is not visualized. No evidence of pulmonic stenosis. Aorta: The aortic root is normal in size and structure. Pulmonary Artery: Indeterminant PASP, inadequate TR jet. Venous: The inferior vena cava is normal in size with greater than 50% respiratory variability, suggesting right atrial pressure of 3 mmHg. IAS/Shunts: No atrial level shunt detected by color flow Doppler. LEFT VENTRICLE PLAX 2D LVIDd:         4.20 cm LVIDs:         2.80 cm LV PW:         1.30 cm LV IVS:        1.10 cm LVOT diam:     2.00 cm LV SV:         71 LV SV Index:   31 LVOT Area:     3.14 cm  IVC IVC diam: 1.70 cm LEFT ATRIUM         Index LA diam:    4.30 cm 1.87 cm/m  AORTIC VALVE LVOT Vmax:   116.00 cm/s LVOT Vmean:  76.100 cm/s LVOT VTI:    0.227 m  AORTA Ao Root diam: 3.10 cm MITRAL VALVE MV Area (PHT): 2.91 cm    SHUNTS MV Decel Time: 261 msec    Systemic VTI:  0.23 m MV E velocity: 75.40 cm/s  Systemic Diam: 2.00 cm MV A velocity: 66.00 cm/s MV E/A ratio:  1.14 Carlyle Dolly MD Electronically signed by Carlyle Dolly MD Signature Date/Time: 09/28/2020/2:52:33 PM    Final     Lab Data:  CBC: Recent Labs  Lab 09/28/20 0007 09/29/20 0422 09/30/20 0430 10/01/20 0119 10/02/20 0057 10/03/20 0112 10/04/20 0030  WBC 4.3 9.7 10.7* 12.2* 13.5* 13.6* 12.4*  NEUTROABS 3.3 9.2* 9.2* 11.0*  --   --   --   HGB 15.1 15.0 14.3 14.6 15.5 15.1 14.8  HCT 44.5 44.4 42.5 41.7 43.5 41.6 43.6  MCV 88.5 89.5 90.4 87.8 88.2 87.0 89.0  PLT 115* 171 184 204 238 244 [id number]   Basic Metabolic Panel: Recent Labs  Lab 09/28/20 0007 09/29/20 0422 09/30/20 0430 10/01/20 0119 10/02/20 0057 10/03/20 0112 10/04/20 0030  NA 133* 137 135 134* 134* 132* 132*  K 3.9 4.3 4.4 4.9 4.8 5.0 5.1  CL 99 99 101 104 104 102 105  CO2 22 24 24  20* 19* 18* 18*  GLUCOSE 171* 180* 185* 169* 158* 179*  194*  BUN 25* 33* 33* 28* 27* 28* 30*  CREATININE 0.95 1.13 1.08 1.03 0.97 0.93 1.10  CALCIUM 8.0* 8.4* 8.2* 8.3* 8.5* 8.5* 8.2*  MG 2.8* 3.0* 2.8* 2.8*  --   --   --   PHOS 2.9 3.0 3.5 2.7  --   --   --    GFR: Estimated Creatinine Clearance: 93.7 mL/min (by C-G formula based on SCr of 1.1 mg/dL). Liver Function Tests: Recent Labs  Lab 09/30/20 0430 10/01/20 0119 10/02/20 0057 10/03/20 0112 10/04/20 0030  AST 68* 51* 26 18 18   ALT 132* 131* 95* 67* 58*  ALKPHOS 106 101 105 90 84  BILITOT 1.1 1.5* 1.2 1.4* 1.4*  PROT 6.5 6.3* 6.6 6.5 6.3*  ALBUMIN 2.8* 2.9* 3.0* 2.9* 2.8*   No results for input(s): LIPASE, AMYLASE in the last 168 hours. No results for input(s): AMMONIA in the last 168 hours. Coagulation Profile: No results for input(s): INR, PROTIME in the last 168 hours. Cardiac Enzymes: No results for input(s): CKTOTAL, CKMB, CKMBINDEX, TROPONINI in the last 168 hours. BNP (last 3 results) No results for input(s): PROBNP in the last 8760 hours. HbA1C: No results for input(s): HGBA1C in the last 72 hours. CBG: No results for input(s): GLUCAP in the last 168 hours. Lipid Profile: No results for input(s): CHOL, HDL, LDLCALC, TRIG, CHOLHDL, LDLDIRECT in the last 72 hours. Thyroid Function Tests: No results for input(s): TSH, T4TOTAL, FREET4, T3FREE, THYROIDAB in the last 72 hours. Anemia Panel: No results for input(s): VITAMINB12, FOLATE, FERRITIN, TIBC, IRON, RETICCTPCT in the last 72 hours. Urine analysis: No results found for: COLORURINE, APPEARANCEUR, LABSPEC, PHURINE, GLUCOSEU, HGBUR, BILIRUBINUR, KETONESUR, PROTEINUR, UROBILINOGEN, NITRITE, Louie Boston Bentli Llorente M.D. [facility] 10/04/2020, 1:17 PM   Call night coverage person covering after 7pm

## 2020-10-04 NOTE — Plan of Care (Signed)

## 2020-10-05 LAB — COMPREHENSIVE METABOLIC PANEL
ALT: 58 U/L — ABNORMAL HIGH (ref 0–44)
AST: 22 U/L (ref 15–41)
Albumin: 2.7 g/dL — ABNORMAL LOW (ref 3.5–5.0)
Alkaline Phosphatase: 80 U/L (ref 38–126)
Anion gap: 9 (ref 5–15)
BUN: 26 mg/dL — ABNORMAL HIGH (ref 6–20)
CO2: 22 mmol/L (ref 22–32)
Calcium: 8.3 mg/dL — ABNORMAL LOW (ref 8.9–10.3)
Chloride: 101 mmol/L (ref 98–111)
Creatinine, Ser: 1.06 mg/dL (ref 0.61–1.24)
GFR, Estimated: >60 mL/min (ref >60)
Glucose, Bld: 165 mg/dL — ABNORMAL HIGH (ref 70–99)
Potassium: 4.9 mmol/L (ref 3.5–5.1)
Sodium: 132 mmol/L — ABNORMAL LOW (ref 135–145)
Total Bilirubin: 1.4 mg/dL — ABNORMAL HIGH (ref 0.3–1.2)
Total Protein: 6.2 g/dL — ABNORMAL LOW (ref 6.5–8.1)

## 2020-10-05 LAB — CBC
HCT: 41.2 % (ref 39.0–52.0)
Hemoglobin: 14.3 g/dL (ref 13.0–17.0)
MCH: 30.7 pg (ref 26.0–34.0)
MCHC: 34.7 g/dL (ref 30.0–36.0)
MCV: 88.4 fL (ref 80.0–100.0)
Platelets: 245 10*3/uL (ref 150–400)
RBC: 4.66 MIL/uL (ref 4.22–5.81)
RDW: 13.2 % (ref 11.5–15.5)
WBC: 16.9 10*3/uL — ABNORMAL HIGH (ref 4.0–10.5)
nRBC: 0 % (ref 0.0–0.2)

## 2020-10-05 LAB — BRAIN NATRIURETIC PEPTIDE: B Natriuretic Peptide: 90.5 pg/mL (ref 0.0–100.0)

## 2020-10-05 LAB — D-DIMER, QUANTITATIVE: D-Dimer, Quant: 1.64 ug/mL-FEU — ABNORMAL HIGH (ref 0.00–0.50)

## 2020-10-05 NOTE — Progress Notes (Signed)
[facility]                                                                              Patient Demographics  Matthew [name], is a 54 y.o. male, DOB - [birth year], TIR:[id number]  Admit date - 09/26/2020   Admitting Physician Matthew Bruins, MD  Outpatient Primary MD for the patient is Patient, No Pcp Per  Outpatient specialists:   LOS - 9  days   Medical records reviewed and are as summarized below:    No chief complaint on file.      Brief summary   Patient is a 54 year old male with history of CAD status post stent x2, tobacco use (78-pack-year history, quit in February, 2021),  HLP, GERD, presented with shortness of breath.  Patient had gradual worsening of shortness of breath since 12/9 and GI illness from around 12/10-12/14.  He was seen at the urgent care for diarrhea, nausea and vomiting.  Patient was treated with fluids and supportive care, had declined Covid test at that time.  As shortness of breath progressively worsened, patient called the EMS, was found to have O2 sats 78% on room air with significant rails.  Patient was placed on 15 L NRB with improvement to 90%.  Vaccination status: Patient is unvaccinated for COVID-19  Matthew [name] was admitted due to hypoxic respiratory failure from severe COVID-19 pneumonia  Subjective:   Matthew [name] was seen and examined today.  Reports good night sleep, report dyspnea with minimal activity, denies any fever or chills, reports fatigue.    Assessment & Plan   Acute hypoxic respiratory failure due to acute COVID-19 viral pneumonia during the ongoing COVID-19 pandemic- POA -He is unvaccinated - Patient presented with worsening shortness of breath, found to be hypoxic, chest x-ray showed pulmonary edema with effusions, Covid test positive, unvaccinated.   - Continue IV Solu-Medrol, 60 mg every 12 hours. -Treated with IV remdesivir . -Continue with baricitinib  - CT angiogram chest negative for PE -  Continue Supportive care: vitamin C/zinc, albuterol, Tylenol, I-S, flutter valve, proning advised. -Due to severe Covid, significant oxygen requirement, this has been improving, he remains on 11 L high flow nasal cannula this morning, he required up to 15 this morning with activity. -Encouraged use incentive spirometry, flutter valve, to prone and out of bed to chair.  - Oxygen - SpO2: 91 % O2 Flow Rate (L/min): 11 L/min FiO2 (%): (!) 15 % HFNC - Continue to follow labs as below  Lab Results  Component Value Date   SARSCOV2NAA POSITIVE (A) 09/26/2020   SARSCOV2NAA NEGATIVE 02/06/2020   Matthew [name] NEGATIVE 11/17/2019     Recent Labs  Lab 09/29/20 0422 09/30/20 0430 10/01/20 0119 10/02/20 0057 10/03/20 0112 10/04/20 0030 10/05/20 0149  DDIMER 1.49* 1.44* 1.76* 1.57* 2.36* 1.93* 1.64*  FERRITIN 2,958* 2,100* 2,102*  --   --   --   --   CRP 3.6* 1.4* 0.9  --   --   --   --   ALT 133* 132* 131* 95* 67* 58* 58*   Acute CHF with a preserved EF, with cardiomegaly, pulmonary edema, bilateral pleural effusion -Received Lasix  60 mg IV x1 in ED, BNP 23.4, troponin 9 -2D echo with a preserved EF -Initially on scheduled Lasix, but he does appear to be a euvolemic currently, he is with normal BNP today, far no indication for Lasix.  History of CAD status post stents x2 -Troponins negative, currently no chest pain, not on any home medications for this  Acute on chronic thrombocytopenia -Possibly worsened due to COVID-19, platelets 88K on admission, resolved  Transaminitis -Due to Covid, stable, monitor closely.  GERD Continue PPI I  Mild acute kidney injury  -Resolved  History of hemoptysis complicated by epistaxis in 11/2019 -Follows Dr. Inez [name], was found to have clot in LUL bronchus  Constipation - he was encouraged to take his laxatives.  Obesity Estimated body mass index is 33.21 kg/m as calculated from the following:   Height as of this encounter: 5\' 11"  (1.803  m).   Weight as of this encounter: 108 kg.  Code Status: Full CODE STATUS DVT Prophylaxis: Lovenox Family Communication: Discussed with patient's wife patient's wife, Matthew [name] [phone] (she is also covid positive) by phone daily.  Disposition Plan:     Status is: Inpatient  Remains inpatient appropriate because:Inpatient level of care appropriate due to severity of illness   Dispo: The patient is from: Home              Anticipated d/c is to: Home              Anticipated d/c date is: > 3 days              Patient currently is not medically stable to d/c.  Procedures:  2D echo  Consultants:   None  Antimicrobials:   Anti-infectives (From admission, onward)   Start     Dose/Rate Route Frequency Ordered Stop   09/27/20 1000  remdesivir 100 mg in sodium chloride 0.9 % 100 mL IVPB       "Followed by" Linked Group Details   100 mg 200 mL/hr over 30 Minutes Intravenous Daily 09/26/20 2208 09/30/20 1900   09/26/20 2215  remdesivir 200 mg in sodium chloride 0.9% 250 mL IVPB       "Followed by" Linked Group Details   200 mg 580 mL/hr over 30 Minutes Intravenous Once 09/26/20 2208 09/27/20 0146         Medications  Scheduled Meds: . baricitinib  4 mg Oral Daily  . enoxaparin (LOVENOX) injection  55 mg Subcutaneous Daily  . feeding supplement  237 mL Oral BID BM  . melatonin  3 mg Oral QHS  . methylPREDNISolone (SOLU-MEDROL) injection  60 mg Intravenous BID  . pantoprazole  40 mg Oral Daily  . polyethylene glycol  17 g Oral Once  . sodium chloride flush  3 mL Intravenous Q12H   Continuous Infusions:  PRN Meds:.acetaminophen, guaiFENesin-dextromethorphan, ondansetron (ZOFRAN) IV, polyethylene glycol, sodium chloride        Objective:   Vitals:   10/05/20 0424 10/05/20 0441 10/05/20 0720 10/05/20 1147  BP:  (!) 103/57 94/62 110/76  Pulse:  61    Resp:  (!) 21 18 16   Temp:  98.3 F (36.8 C) 97.6 F (36.4 C) (!) 97.3 F (36.3 C)  TempSrc:   Axillary Oral Axillary  SpO2:  91%    Weight: 108 kg     Height:        Intake/Output Summary (Last 24 hours) at 10/05/2020 1341 Last data filed at 10/05/2020 0934 Gross per 24 hour  Intake 240 ml  Output 300 ml  Net -60 ml     Wt Readings from Last 3 Encounters:  10/05/20 108 kg  12/04/19 108.4 kg  11/20/19 107.9 kg    Physical Exam  Awake Alert, Oriented X 3, No new F.N deficits, Normal affect Symmetrical Chest wall movement, Good air movement bilaterally, CTAB RRR,No Gallops,Rubs or new Murmurs, No Parasternal Heave +ve B.Sounds, Abd Soft, No tenderness, No rebound - guarding or rigidity. No Cyanosis, Clubbing or edema, No new Rash or bruise        Data Reviewed:  I have personally reviewed following labs and imaging studies  Micro Results Recent Results (from the past 240 hour(s))  Resp Panel by RT-PCR (Flu A&B, Covid) Nasopharyngeal Swab     Status: Abnormal   Collection Time: 09/26/20  7:03 PM   Specimen: Nasopharyngeal Swab; Nasopharyngeal(NP) swabs in vial transport medium  Result Value Ref Range Status   SARS Coronavirus 2 by RT PCR POSITIVE (A) NEGATIVE Final    Comment: RESULT CALLED TO, READ BACK BY AND VERIFIED WITH: G TATE RN 09/26/20 2143 JDW (NOTE) SARS-CoV-2 target nucleic acids are DETECTED.  The SARS-CoV-2 RNA is generally detectable in upper respiratory specimens during the acute phase of infection. Positive results are indicative of the presence of the identified virus, but do not rule out bacterial infection or co-infection with other pathogens not detected by the test. Clinical correlation with patient history and other diagnostic information is necessary to determine patient infection status. The expected result is Negative.  Fact Sheet for Patients: EntrepreneurPulse.com.au  Fact Sheet for Healthcare Providers: IncredibleEmployment.be  This test is not yet approved or cleared by the [place]  FDA and  has been authorized for detection and/or diagnosis of SARS-CoV-2 by FDA under an Emergency Use Authorization (EUA).  This EUA will remain in effect (meaning this test can be used)  for the duration of  the COVID-19 declaration under Section 564(b)(1) of the Act, 21 U.S.C. section 360bbb-3(b)(1), unless the authorization is terminated or revoked sooner.     Influenza A by PCR NEGATIVE NEGATIVE Final   Influenza B by PCR NEGATIVE NEGATIVE Final    Comment: (NOTE) The Xpert Xpress SARS-CoV-2/FLU/RSV plus assay is intended as an aid in the diagnosis of influenza from Nasopharyngeal swab specimens and should not be used as a sole basis for treatment. Nasal washings and aspirates are unacceptable for Xpert Xpress SARS-CoV-2/FLU/RSV testing.  Fact Sheet for Patients: EntrepreneurPulse.com.au  Fact Sheet for Healthcare Providers: IncredibleEmployment.be  This test is not yet approved or cleared by the [place] FDA and has been authorized for detection and/or diagnosis of SARS-CoV-2 by FDA under an Emergency Use Authorization (EUA). This EUA will remain in effect (meaning this test can be used) for the duration of the COVID-19 declaration under Section 564(b)(1) of the Act, 21 U.S.C. section 360bbb-3(b)(1), unless the authorization is terminated or revoked.  Performed at [facility], [place] [address] [place], [place] [address]   Blood Culture (routine x 2)     Status: None   Collection Time: 09/27/20 12:31 AM   Specimen: BLOOD  Result Value Ref Range Status   Specimen Description BLOOD SITE NOT SPECIFIED  Final   Special Requests   Final    BOTTLES DRAWN AEROBIC AND ANAEROBIC Blood Culture adequate volume   Culture   Final    NO GROWTH 5 DAYS Performed at [facility], [address], [place], [place] [address]    Report Status 10/02/2020 FINAL  Final  MRSA PCR Screening     Status: None   Collection Time: 09/28/20   4:14 AM   Specimen: Nasal Mucosa; Nasopharyngeal  Result Value Ref Range Status   MRSA by PCR NEGATIVE NEGATIVE Final    Comment:        The GeneXpert MRSA Assay (FDA approved for NASAL specimens only), is one component of a comprehensive MRSA colonization surveillance program. It is not intended to diagnose MRSA infection nor to guide or monitor treatment for MRSA infections. Performed at [facility], [place] [address] [place], [place] [address]     Radiology Reports CT ANGIO CHEST PE W OR WO CONTRAST  Result Date: 09/27/2020 CLINICAL DATA:  54 year old male with history of COVID-19, suspected pulmonary embolism. EXAM: CT ANGIOGRAPHY CHEST WITH CONTRAST TECHNIQUE: Multidetector CT imaging of the chest was performed using the standard protocol during bolus administration of intravenous contrast. Multiplanar CT image reconstructions and MIPs were obtained to evaluate the vascular anatomy. CONTRAST:  100 mL Omnipaque 350, intravenous COMPARISON:  Chest CT from 11/17/2019 FINDINGS: Cardiovascular: Satisfactory opacification of the pulmonary arteries to the segmental level. No evidence of pulmonary embolism. Normal heart size. No pericardial effusion. The heart is normal in size. No pericardial effusion. Severe coronary atherosclerotic calcifications. Mediastinum/Nodes: Similar appearing prominent right paratracheal lymph node measuring up to 1.2 cm in short axis. Otherwise no enlarged mediastinal, hilar, or axillary lymph nodes. Thyroid gland, trachea, and esophagus demonstrate no significant findings. Lungs/Pleura: Interval development of near diffuse, peripherally predominant ground-glass pulmonary opacities with a mid lung and basal predominance. Similar appearing moderate upper lobe predominant paraseptal and centrilobular emphysema. No pleural effusion or pneumothorax. Upper Abdomen: The visualized upper abdomen is within normal limits. Musculoskeletal: No chest wall abnormality. No  acute or significant osseous findings. Review of the MIP images confirms the above findings. IMPRESSION: Vascular: 1. No evidence of pulmonary embolism. 2. Severe coronary atherosclerotic calcifications. Non-Vascular: 1. Diffuse, mid lung and basilar, peripherally predominant ground-glass opacities - findings compatible with multifocal pneumonia associated with COVID-19. 2. Similar appearing moderate upper lobe predominant centrilobular and paraseptal emphysema. Electronically Signed   By: Ruthann Cancer MD   On: 09/27/2020 14:31   DG CHEST PORT 1 VIEW  Result Date: 09/26/2020 CLINICAL DATA:  COVID-19 EXAM: PORTABLE CHEST 1 VIEW COMPARISON:  09/26/2020 FINDINGS: Slight worsening of bilateral basilar predominant opacities. No pleural effusion or pneumothorax. Normal cardiomediastinal contours. IMPRESSION: Slight worsening of bilateral basilar predominant opacities. Electronically Signed   By: Ulyses Jarred M.D.   On: 09/26/2020 23:26   DG Chest Portable 1 View  Result Date: 09/26/2020 CLINICAL DATA:  Cough and shortness of breath. EXAM: PORTABLE CHEST 1 VIEW COMPARISON:  12/04/2019 FINDINGS: Cardiomegaly which is increased from prior exam. Pulmonary edema, moderate in degree. There are bilateral pleural effusions. No confluent consolidation. No pneumothorax. No acute osseous abnormalities are seen. IMPRESSION: CHF with cardiomegaly, pulmonary edema and bilateral pleural effusions. Electronically Signed   By: Keith Rake M.D.   On: 09/26/2020 19:14   ECHOCARDIOGRAM LIMITED  Result Date: 09/28/2020    ECHOCARDIOGRAM LIMITED REPORT   Patient Name:   Govind Loletha Grayer Willadsen Date of Exam: 09/28/2020 Medical Rec #:  [id number]          Height:       71.0 in Accession #:    [id number]         Weight:       244.3 lb Date of Birth:  [birth year]  BSA:          2.296 m Patient Age:    49 years           BP:           120/84 mmHg Patient Gender: M                  HR:           73 bpm. Exam Location:   Inpatient Procedure: Limited Echo, Limited Color Doppler and Cardiac Doppler Indications:    acute respiratory distress  History:        Patient has prior history of Echocardiogram examinations, most                 recent 03/30/2016. CAD; Covid.  Sonographer:    Johny Chess Referring Phys: [id number] Candace Gallus MELVIN  Sonographer Comments: Image acquisition challenging due to respiratory motion. IMPRESSIONS  1. Left ventricular ejection fraction, by estimation, is 60 to 65%. The left ventricle has normal function. The left ventricle has no regional wall motion abnormalities. There is mild left ventricular hypertrophy.  2. Right ventricular systolic function is normal. The right ventricular size is normal.  3. The mitral valve is normal in structure. No evidence of mitral valve regurgitation. No evidence of mitral stenosis.  4. The aortic valve is tricuspid.  5. The inferior vena cava is normal in size with greater than 50% respiratory variability, suggesting right atrial pressure of 3 mmHg. FINDINGS  Left Ventricle: Left ventricular ejection fraction, by estimation, is 60 to 65%. The left ventricle has normal function. The left ventricle has no regional wall motion abnormalities. The left ventricular internal cavity size was normal in size. There is  mild left ventricular hypertrophy. Right Ventricle: The right ventricular size is normal. No increase in right ventricular wall thickness. Right ventricular systolic function is normal. Pericardium: There is no evidence of pericardial effusion. Mitral Valve: The mitral valve is normal in structure. No evidence of mitral valve stenosis. Tricuspid Valve: The tricuspid valve is normal in structure. Tricuspid valve regurgitation is not demonstrated. No evidence of tricuspid stenosis. Aortic Valve: The aortic valve is tricuspid. Pulmonic Valve: The pulmonic valve was not well visualized. Pulmonic valve regurgitation is not visualized. No evidence of pulmonic stenosis.  Aorta: The aortic root is normal in size and structure. Pulmonary Artery: Indeterminant PASP, inadequate TR jet. Venous: The inferior vena cava is normal in size with greater than 50% respiratory variability, suggesting right atrial pressure of 3 mmHg. IAS/Shunts: No atrial level shunt detected by color flow Doppler. LEFT VENTRICLE PLAX 2D LVIDd:         4.20 cm LVIDs:         2.80 cm LV PW:         1.30 cm LV IVS:        1.10 cm LVOT diam:     2.00 cm LV SV:         71 LV SV Index:   31 LVOT Area:     3.14 cm  IVC IVC diam: 1.70 cm LEFT ATRIUM         Index LA diam:    4.30 cm 1.87 cm/m  AORTIC VALVE LVOT Vmax:   116.00 cm/s LVOT Vmean:  76.100 cm/s LVOT VTI:    0.227 m  AORTA Ao Root diam: 3.10 cm MITRAL VALVE MV Area (PHT): 2.91 cm    SHUNTS MV Decel Time: 261 msec    Systemic VTI:  0.23  m MV E velocity: 75.40 cm/s  Systemic Diam: 2.00 cm MV A velocity: 66.00 cm/s MV E/A ratio:  1.14 Carlyle Dolly MD Electronically signed by Carlyle Dolly MD Signature Date/Time: 09/28/2020/2:52:33 PM    Final     Lab Data:  CBC: Recent Labs  Lab 09/29/20 0422 09/30/20 0430 10/01/20 0119 10/02/20 0057 10/03/20 0112 10/04/20 0030 10/05/20 0149  WBC 9.7 10.7* 12.2* 13.5* 13.6* 12.4* 16.9*  NEUTROABS 9.2* 9.2* 11.0*  --   --   --   --   HGB 15.0 14.3 14.6 15.5 15.1 14.8 14.3  HCT 44.4 42.5 41.7 43.5 41.6 43.6 41.2  MCV 89.5 90.4 87.8 88.2 87.0 89.0 88.4  PLT 171 184 204 238 244 239 [id number]   Basic Metabolic Panel: Recent Labs  Lab 09/29/20 0422 09/30/20 0430 10/01/20 0119 10/02/20 0057 10/03/20 0112 10/04/20 0030 10/05/20 0149  NA 137 135 134* 134* 132* 132* 132*  K 4.3 4.4 4.9 4.8 5.0 5.1 4.9  CL 99 101 104 104 102 105 101  CO2 24 24 20* 19* 18* 18* 22  GLUCOSE 180* 185* 169* 158* 179* 194* 165*  BUN 33* 33* 28* 27* 28* 30* 26*  CREATININE 1.13 1.08 1.03 0.97 0.93 1.10 1.06  CALCIUM 8.4* 8.2* 8.3* 8.5* 8.5* 8.2* 8.3*  MG 3.0* 2.8* 2.8*  --   --   --   --   PHOS 3.0 3.5 2.7  --   --   --    --    GFR: Estimated Creatinine Clearance: 97.3 mL/min (by C-G formula based on SCr of 1.06 mg/dL). Liver Function Tests: Recent Labs  Lab 10/01/20 0119 10/02/20 0057 10/03/20 0112 10/04/20 0030 10/05/20 0149  AST 51* 26 18 18 22   ALT 131* 95* 67* 58* 58*  ALKPHOS 101 105 90 84 80  BILITOT 1.5* 1.2 1.4* 1.4* 1.4*  PROT 6.3* 6.6 6.5 6.3* 6.2*  ALBUMIN 2.9* 3.0* 2.9* 2.8* 2.7*   No results for input(s): LIPASE, AMYLASE in the last 168 hours. No results for input(s): AMMONIA in the last 168 hours. Coagulation Profile: No results for input(s): INR, PROTIME in the last 168 hours. Cardiac Enzymes: No results for input(s): CKTOTAL, CKMB, CKMBINDEX, TROPONINI in the last 168 hours. BNP (last 3 results) No results for input(s): PROBNP in the last 8760 hours. HbA1C: No results for input(s): HGBA1C in the last 72 hours. CBG: No results for input(s): GLUCAP in the last 168 hours. Lipid Profile: No results for input(s): CHOL, HDL, LDLCALC, TRIG, CHOLHDL, LDLDIRECT in the last 72 hours. Thyroid Function Tests: No results for input(s): TSH, T4TOTAL, FREET4, T3FREE, THYROIDAB in the last 72 hours. Anemia Panel: No results for input(s): VITAMINB12, FOLATE, FERRITIN, TIBC, IRON, RETICCTPCT in the last 72 hours. Urine analysis: No results found for: COLORURINE, APPEARANCEUR, LABSPEC, PHURINE, GLUCOSEU, HGBUR, BILIRUBINUR, KETONESUR, PROTEINUR, UROBILINOGEN, NITRITE, Louie Boston Nayab Aten M.D. [facility] 10/05/2020, 1:41 PM   Call night coverage person covering after 7pm

## 2020-10-05 NOTE — Plan of Care (Signed)

## 2020-10-06 ENCOUNTER — Inpatient Hospital Stay (HOSPITAL_COMMUNITY): Payer: HRSA Program

## 2020-10-06 LAB — COMPREHENSIVE METABOLIC PANEL
ALT: 45 U/L — ABNORMAL HIGH (ref 0–44)
AST: 20 U/L (ref 15–41)
Albumin: 2.5 g/dL — ABNORMAL LOW (ref 3.5–5.0)
Alkaline Phosphatase: 79 U/L (ref 38–126)
Anion gap: 8 (ref 5–15)
BUN: 19 mg/dL (ref 6–20)
CO2: 23 mmol/L (ref 22–32)
Calcium: 8.2 mg/dL — ABNORMAL LOW (ref 8.9–10.3)
Chloride: 100 mmol/L (ref 98–111)
Creatinine, Ser: 0.97 mg/dL (ref 0.61–1.24)
GFR, Estimated: >60 mL/min (ref >60)
Glucose, Bld: 161 mg/dL — ABNORMAL HIGH (ref 70–99)
Potassium: 4.7 mmol/L (ref 3.5–5.1)
Sodium: 131 mmol/L — ABNORMAL LOW (ref 135–145)
Total Bilirubin: 0.9 mg/dL (ref 0.3–1.2)
Total Protein: 6.1 g/dL — ABNORMAL LOW (ref 6.5–8.1)

## 2020-10-06 LAB — CBC
HCT: 39.3 % (ref 39.0–52.0)
Hemoglobin: 14.1 g/dL (ref 13.0–17.0)
MCH: 31.8 pg (ref 26.0–34.0)
MCHC: 35.9 g/dL (ref 30.0–36.0)
MCV: 88.5 fL (ref 80.0–100.0)
Platelets: 238 10*3/uL (ref 150–400)
RBC: 4.44 MIL/uL (ref 4.22–5.81)
RDW: 13.2 % (ref 11.5–15.5)
WBC: 14 10*3/uL — ABNORMAL HIGH (ref 4.0–10.5)
nRBC: 0 % (ref 0.0–0.2)

## 2020-10-06 LAB — D-DIMER, QUANTITATIVE: D-Dimer, Quant: 1.71 ug/mL-FEU — ABNORMAL HIGH (ref 0.00–0.50)

## 2020-10-06 MED ORDER — ALPRAZOLAM 0.25 MG PO TABS
0.2500 mg | ORAL_TABLET | Freq: Three times a day (TID) | ORAL | Status: DC | PRN
  Administered 2020-10-06 – 2020-10-08 (×5): 0.25 mg via ORAL
  Filled 2020-10-06 (×5): qty 1

## 2020-10-06 NOTE — Progress Notes (Signed)
Occupational Therapy Treatment Note  Pt able to ambulate to bathroom on 15L NRB with desat to 85. VC for relaxation/breathing techniques to manage anxiety associated with feelings of air hunger. Pt recovers quickly to 90. Desat again to 81 on 15L NRB during pericare, requiring momentary increase to 25L (less than 1 minute) to help with recovery. HR 94; BP 98/79. Pt apparently concerned and does well with positive feedback/reassurance. Pt states he feels like he "made a lot of progress today". Encouraged pt to do several sit - stands and use BSC with nursing however he will most likely need to use the NRB during activity. Will continue to follow acutely.    10/06/20 1700  OT Visit Information  Last OT Received On 10/06/20  Assistance Needed +1  History of Present Illness Pt is a 54 y.o. male admitted 09/26/20 with SOB and GI distress (had declined COVID test when seen in urgent care). Pt now testing (+) COVID-19. Workup for acute hypoxic respiratory failure due to acute COVID-19 viral PNA. PMH includes CAD, CHF, tobacco use (quit 11/2019); pt unvaccinated.  Precautions  Precaution Comments becomes anxious with increased O2 demands  Pain Assessment  Pain Assessment No/denies pain  Cognition  Arousal/Alertness Awake/alert  Behavior During Therapy Anxious  Overall Cognitive Status Within Functional Limits for tasks assessed  Upper Extremity Assessment  Upper Extremity Assessment Generalized weakness  Lower Extremity Assessment  Lower Extremity Assessment Defer to PT evaluation  ADL  Overall ADL's  Needs assistance/impaired  Toilet Transfer Min guard;Ambulation  Toileting- Water quality scientist and Hygiene Supervision/safety;Set up  Functional mobility during ADLs Min guard;Rolling walker;Cueing for safety  General ADL Comments Ambulated to bathroom on 15L NRB. Once in bathroom pt states "it's happening again"> SpO2 desat to 85. O2 increased to 25L for less than 1 min to help pt recover then  returned to 15L. Pt again desated to low 80s when pericare. Educated pt on chunking activity and resting. Pt stated "so I should wipe the fron, rest adn get my breath then do the back". Discussed oxygen hunger adn cmopensatory strategies to use to avoid become anxious with activity. Pt verbalized understanidng. Pt also talked about what happended this am with moving to EOB and becoming anxious/feeling he "couldn't get enough air". Recommend that pt use his bed positioning to move hiim into an upright position before he trys to move to EOB. Also recommended that he call his nurse before moving so she could adjust his oxygen as needed to avoid feelings of "oxygen hunger". Pt verbalized understanding.  Transfers  Overall transfer level Needs assistance  Equipment used Rolling walker (2 wheeled)  Transfers Sit to/from Stand  Sit to Stand Supervision  Stand pivot transfers Supervision  Other Exercises  Other Exercises pursed lip breathing  OT - End of Session  Equipment Utilized During Treatment Oxygen;Rolling walker (15 - 25L NRB)  Activity Tolerance Patient tolerated treatment well  Patient left in chair;with call bell/phone within reach  Nurse Communication Mobility status;Other (comment) (O2 needs)  OT Assessment/Plan  OT Plan Discharge plan remains appropriate  OT Visit Diagnosis Unsteadiness on feet (R26.81);Other abnormalities of gait and mobility (R26.89);Muscle weakness (generalized) (M62.81);Dizziness and giddiness (R42)  OT Frequency (ACUTE ONLY) Min 3X/week  Follow Up Recommendations No OT follow up;Supervision - Intermittent  OT Equipment Tub/shower seat  AM-PAC OT "6 Clicks" Daily Activity Outcome Measure (Version 2)  Help from another person eating meals? 4  Help from another person taking care of personal grooming? 3  Help from another  person toileting, which includes using toliet, bedpan, or urinal? 3  Help from another person bathing (including washing, rinsing, drying)? 3   Help from another person to put on and taking off regular upper body clothing? 3  Help from another person to put on and taking off regular lower body clothing? 3  6 Click Score 19  OT Goal Progression  Progress towards OT goals Progressing toward goals  Acute Rehab OT Goals  Patient Stated Goal to walk out of here  OT Goal Formulation With patient  Time For Goal Achievement 10/16/20  Potential to Achieve Goals Good  ADL Goals  Pt Will Perform Lower Body Bathing with modified independence;sit to/from stand  Pt Will Perform Lower Body Dressing with modified independence;sit to/from stand  Pt Will Transfer to Toilet with modified independence;ambulating  Pt/caregiver will Perform Home Exercise Program Increased strength;Both right and left upper extremity;With written HEP provided;Independently;With theraband  Additional ADL Goal #1 Pt will independently verbalize 3 energy conservation strategies  Additional ADL Goal #2 Pt will maintain SpO2 greater than 88 during functional activity  Additional ADL Goal #3 Pt will demonstrate use of 2 anxiety managment strategies with min vc to help with mobility and ADL tasks  OT Time Calculation  OT Start Time (ACUTE ONLY) 1600  OT Stop Time (ACUTE ONLY) 1650  OT Time Calculation (min) 50 min  OT General Charges  $OT Visit 1 Visit  OT Treatments  $Self Care/Home Management  38-52 mins  Maurie Boettcher, OT/L   Acute OT Clinical Specialist [place] Pager [phone] Office [phone]

## 2020-10-06 NOTE — Progress Notes (Signed)
Occupational Therapy Treatment Patient Details Name: Matthew [name] MRN: [id number] DOB: 1966-01-29 Today's Date: 10/06/2020    History of present illness Pt is a 54 y.o. male admitted 09/26/20 with SOB and GI distress (had declined COVID test when seen in urgent care). Pt now testing (+) COVID-19. Workup for acute hypoxic respiratory failure due to acute COVID-19 viral PNA. PMH includes CAD, CHF, tobacco use (quit 11/2019); pt unvaccinated.   OT comments  On entry to on 15L HFNC hooked up to 2 extensions with SpO2 @ 95. Pt also had NRB hooked up to 2 extensions which he was using as needed. HFNC correctty set up onto humidifier and NRB attached to wall without extensions and nsg/nurse tech notified. O2 decreased to 10L and SpO2 remained in 90s at rest with pt talking. Goal was to ambulate however focus of session spent educating pt on anxiety management and breathing techniques associated with air hunger. Pt states " I know I need to walk but I just can't right now".  Pt tearful at times talking about the fear he felt "not being able to breath". Talked about his conversation with the MD and knowing that he will not leave next week. Pt agreeable to attempt ambulation after lunch. Will return. Pt very appreciative.    Follow Up Recommendations  No OT follow up;Supervision - Intermittent    Equipment Recommendations  Tub/shower seat    Recommendations for Other Services      Precautions / Restrictions Precautions Precautions: Fall;Other (comment) Precaution Comments: becomes anxious with increased O2 demands       Mobility Bed Mobility                  Transfers                      Balance                                           ADL either performed or assessed with clinical judgement   ADL                                         General ADL Comments: Pt states "I ran out of air just getting up to the edge of bed to pee".  Educated pt on anxiety managemetn strategies to help deal with feeling of oxygen hunger including pursed lip breathing adn visualization prior to activity.     Vision       Perception     Praxis      Cognition Arousal/Alertness: Awake/alert Behavior During Therapy: Anxious Overall Cognitive Status: Within Functional Limits for tasks assessed                                          Exercises Exercises: Other exercises Other Exercises Other Exercises: pursed lip breathing   Shoulder Instructions       General Comments Time spent educating pt on oxygen set up to help him manage his care. Increased time spent on educating pt that it is OK to walk to the bathroom with staff but that he needs an O2 tank adn need to be on HFNC, not extensions.  Pt verbalzied understanding.    Pertinent Vitals/ Pain       Pain Assessment: No/denies pain  Home Living                                          Prior Functioning/Environment              Frequency  Min 3X/week        Progress Toward Goals  OT Goals(current goals can now be found in the care plan section)  Progress towards OT goals: Progressing toward goals  Acute Rehab OT Goals Patient Stated Goal: to walk out of here OT Goal Formulation: With patient Time For Goal Achievement: 10/16/20 Potential to Achieve Goals: Good ADL Goals Pt Will Perform Lower Body Bathing: with modified independence;sit to/from stand Pt Will Perform Lower Body Dressing: with modified independence;sit to/from stand Pt Will Transfer to Toilet: with modified independence;ambulating Pt/caregiver will Perform Home Exercise Program: Increased strength;Both right and left upper extremity;With written HEP provided;Independently;With theraband Additional ADL Goal #1: Pt will independently verbalize 3 energy conservation strategies Additional ADL Goal #2: Pt will maintain SpO2 greater than 88 during functional  activity Additional ADL Goal #3: Pt will demonstrate use of 2 anxiety managment strategies with min vc to help with mobility and ADL tasks  Plan Discharge plan remains appropriate    Co-evaluation                 AM-PAC OT "6 Clicks" Daily Activity     Outcome Measure   Help from another person eating meals?: None Help from another person taking care of personal grooming?: A Little Help from another person toileting, which includes using toliet, bedpan, or urinal?: A Little Help from another person bathing (including washing, rinsing, drying)?: A Little Help from another person to put on and taking off regular upper body clothing?: A Little Help from another person to put on and taking off regular lower body clothing?: A Little 6 Click Score: 19    End of Session Equipment Utilized During Treatment: Oxygen (10-15L)  OT Visit Diagnosis: Unsteadiness on feet (R26.81);Other abnormalities of gait and mobility (R26.89);Muscle weakness (generalized) (M62.81);Dizziness and giddiness (R42)   Activity Tolerance Patient tolerated treatment well   Patient Left in chair;with call bell/phone within reach   Nurse Communication Mobility status;Other (comment) (no extension tubing with HFNC or NRB)        Time: 2536-6440 OT Time Calculation (min): 19 min  Charges: OT General Charges $OT Visit: 1 Visit OT Treatments $Therapeutic Activity: 8-22 mins  Maurie Boettcher, OT/L   Acute OT Clinical Specialist [place] Pager [phone] Office [phone]    [facility] 10/06/2020, 2:38 PM

## 2020-10-06 NOTE — Progress Notes (Signed)
[facility]                                                                              Patient Demographics  Matthew [name], is a 53 y.o. male, DOB - 1966/02/20, [id number]  Admit date - 09/26/2020   Admitting Physician Marcelyn Bruins, MD  Outpatient Primary MD for the patient is Patient, No Pcp Per  Outpatient specialists:   LOS - 10  days   Medical records reviewed and are as summarized below:    No chief complaint on file.      Brief summary   Patient is a 54 year old male with history of CAD status post stent x2, tobacco use (78-pack-year history, quit in February, 2021),  HLP, GERD, presented with shortness of breath.  Patient had gradual worsening of shortness of breath since 12/9 and GI illness from around 12/10-12/14.  He was seen at the urgent care for diarrhea, nausea and vomiting.  Patient was treated with fluids and supportive care, had declined Covid test at that time.  As shortness of breath progressively worsened, patient called the EMS, was found to have O2 sats 78% on room air with significant rails.  Patient was placed on 15 L NRB with improvement to 90%.  Vaccination status: Patient is unvaccinated for COVID-19  Matthew [name] was admitted due to hypoxic respiratory failure from severe COVID-19 pneumonia  Subjective:   Matthew [name] was seen and examined today.  Patient with some anxiety this morning and increased oxygen requirement, this has improved with Xanax, back on 10 L high flow nasal cannula.   Assessment & Plan   Acute hypoxic respiratory failure due to acute COVID-19 viral pneumonia during the ongoing COVID-19 pandemic- POA -He is unvaccinated - Patient presented with worsening shortness of breath, found to be hypoxic, chest x-ray showed pulmonary edema with effusions, Covid test positive, unvaccinated.   - Continue IV Solu-Medrol, 60 mg every 12 hours. -Treated with IV remdesivir . -Continue with baricitinib  - CT  angiogram chest negative for PE - Continue Supportive care: vitamin C/zinc, albuterol, Tylenol, I-S, flutter valve, proning advised. -Due to severe Covid, significant oxygen requirement, this has been improving, he remains on 11 L high flow nasal cannula this morning, he required up to 15 this morning with activity. -Encouraged use incentive spirometry, flutter valve, to prone and out of bed to chair. -Patient with anxiety and increased work of breathing this morning and increased oxygen at requirement upon getting from out of bed to chair and to some activity, he significantly improved with Xanax, and oxygen requirement has improved after, back to 11 L on high flow nasal cannula.  - Oxygen - SpO2: 92 % O2 Flow Rate (L/min): 11 L/min FiO2 (%): (!) 15 % HFNC - Continue to follow labs as below  Lab Results  Component Value Date   SARSCOV2NAA POSITIVE (A) 09/26/2020   SARSCOV2NAA NEGATIVE 02/06/2020   [place] NEGATIVE 11/17/2019     Recent Labs  Lab 09/30/20 0430 10/01/20 0119 10/02/20 0057 10/03/20 0112 10/04/20 0030 10/05/20 0149 10/06/20 0509  DDIMER 1.44* 1.76* 1.57* 2.36* 1.93* 1.64* 1.71*  FERRITIN 2,100* 2,102*  --   --   --   --   --  CRP 1.4* 0.9  --   --   --   --   --   ALT 132* 131* 95* 67* 58* 58* 45*   Acute CHF with a preserved EF, with cardiomegaly, pulmonary edema, bilateral pleural effusion -Received Lasix 60 mg IV x1 in ED, BNP 23.4, troponin 9 -2D echo with a preserved EF -Initially on scheduled Lasix, but he does appear to be a euvolemic currently, he is with normal BNP today, far no indication for Lasix.  History of CAD status post stents x2 -Troponins negative, currently no chest pain, not on any home medications for this  Acute on chronic thrombocytopenia -Possibly worsened due to COVID-19, platelets 88K on admission, resolved  Transaminitis -Due to Covid, stable, monitor closely.  GERD Continue PPI I  Mild acute kidney injury   -Resolved  History of hemoptysis complicated by epistaxis in 11/2019 -Follows Dr. Inez Catalina, was found to have clot in LUL bronchus  Constipation - he was encouraged to take his laxatives.  Anxiety -On as needed Xanax  Obesity Estimated body mass index is 33.27 kg/m as calculated from the following:   Height as of this encounter: 5\' 11"  (1.803 m).   Weight as of this encounter: 108.2 kg.  Code Status: Full CODE STATUS DVT Prophylaxis: Lovenox Family Communication: Discussed with patient's wife patient's wife, Dee Maday [phone] (she is also covid positive) by phone on 12/25  Disposition Plan:     Status is: Inpatient  Remains inpatient appropriate because:Inpatient level of care appropriate due to severity of illness   Dispo: The patient is from: Home              Anticipated d/c is to: Home              Anticipated d/c date is: > 3 days              Patient currently is not medically stable to d/c.  Procedures:  2D echo  Consultants:   None  Antimicrobials:   Anti-infectives (From admission, onward)   Start     Dose/Rate Route Frequency Ordered Stop   09/27/20 1000  remdesivir 100 mg in sodium chloride 0.9 % 100 mL IVPB       "Followed by" Linked Group Details   100 mg 200 mL/hr over 30 Minutes Intravenous Daily 09/26/20 2208 09/30/20 1900   09/26/20 2215  remdesivir 200 mg in sodium chloride 0.9% 250 mL IVPB       "Followed by" Linked Group Details   200 mg 580 mL/hr over 30 Minutes Intravenous Once 09/26/20 2208 09/27/20 0146         Medications  Scheduled Meds: . baricitinib  4 mg Oral Daily  . enoxaparin (LOVENOX) injection  55 mg Subcutaneous Daily  . feeding supplement  237 mL Oral BID BM  . melatonin  3 mg Oral QHS  . methylPREDNISolone (SOLU-MEDROL) injection  60 mg Intravenous BID  . pantoprazole  40 mg Oral Daily  . polyethylene glycol  17 g Oral Once  . sodium chloride flush  3 mL Intravenous Q12H   Continuous  Infusions:  PRN Meds:.acetaminophen, ALPRAZolam, guaiFENesin-dextromethorphan, ondansetron (ZOFRAN) IV, polyethylene glycol, sodium chloride        Objective:   Vitals:   10/06/20 0427 10/06/20 0500 10/06/20 0730 10/06/20 1200  BP: 98/60  (!) 106/56 107/66  Pulse: 60  75 90  Resp: 20  19 20   Temp: 97.8 F (36.6 C)  97.9 F (36.6 C) 97.6 F (36.4 C)  TempSrc: Axillary  Axillary Oral  SpO2: 100%  95% 92%  Weight:  108.2 kg    Height:        Intake/Output Summary (Last 24 hours) at 10/06/2020 1529 Last data filed at 10/06/2020 1300 Gross per 24 hour  Intake 840 ml  Output 575 ml  Net 265 ml     Wt Readings from Last 3 Encounters:  10/06/20 108.2 kg  12/04/19 108.4 kg  11/20/19 107.9 kg    Physical Exam     Awake Alert, Oriented X 3, No new F.N deficits, Normal affect Symmetrical Chest wall movement, Good air movement bilaterally, CTAB RRR,No Gallops,Rubs or new Murmurs, No Parasternal Heave +ve B.Sounds, Abd Soft, No tenderness, No rebound - guarding or rigidity. No Cyanosis, Clubbing or edema, No new Rash or bruise      Data Reviewed:  I have personally reviewed following labs and imaging studies  Micro Results Recent Results (from the past 240 hour(s))  Resp Panel by RT-PCR (Flu A&B, Covid) Nasopharyngeal Swab     Status: Abnormal   Collection Time: 09/26/20  7:03 PM   Specimen: Nasopharyngeal Swab; Nasopharyngeal(NP) swabs in vial transport medium  Result Value Ref Range Status   SARS Coronavirus 2 by RT PCR POSITIVE (A) NEGATIVE Final    Comment: RESULT CALLED TO, READ BACK BY AND VERIFIED WITH: G TATE RN 09/26/20 2143 JDW (NOTE) SARS-CoV-2 target nucleic acids are DETECTED.  The SARS-CoV-2 RNA is generally detectable in upper respiratory specimens during the acute phase of infection. Positive results are indicative of the presence of the identified virus, but do not rule out bacterial infection or co-infection with other pathogens  not detected by the test. Clinical correlation with patient history and other diagnostic information is necessary to determine patient infection status. The expected result is Negative.  Fact Sheet for Patients: EntrepreneurPulse.com.au  Fact Sheet for Healthcare Providers: IncredibleEmployment.be  This test is not yet approved or cleared by the [place] FDA and  has been authorized for detection and/or diagnosis of SARS-CoV-2 by FDA under an Emergency Use Authorization (EUA).  This EUA will remain in effect (meaning this test can be used)  for the duration of  the COVID-19 declaration under Section 564(b)(1) of the Act, 21 U.S.C. section 360bbb-3(b)(1), unless the authorization is terminated or revoked sooner.     Influenza A by PCR NEGATIVE NEGATIVE Final   Influenza B by PCR NEGATIVE NEGATIVE Final    Comment: (NOTE) The Xpert Xpress SARS-CoV-2/FLU/RSV plus assay is intended as an aid in the diagnosis of influenza from Nasopharyngeal swab specimens and should not be used as a sole basis for treatment. Nasal washings and aspirates are unacceptable for Xpert Xpress SARS-CoV-2/FLU/RSV testing.  Fact Sheet for Patients: EntrepreneurPulse.com.au  Fact Sheet for Healthcare Providers: IncredibleEmployment.be  This test is not yet approved or cleared by the [place] FDA and has been authorized for detection and/or diagnosis of SARS-CoV-2 by FDA under an Emergency Use Authorization (EUA). This EUA will remain in effect (meaning this test can be used) for the duration of the COVID-19 declaration under Section 564(b)(1) of the Act, 21 U.S.C. section 360bbb-3(b)(1), unless the authorization is terminated or revoked.  Performed at [facility], [place] [address] [place], [place] [address]   Blood Culture (routine x 2)     Status: None   Collection Time: 09/27/20 12:31 AM   Specimen: BLOOD   Result Value Ref Range Status   Specimen Description BLOOD SITE NOT SPECIFIED  Final  Special Requests   Final    BOTTLES DRAWN AEROBIC AND ANAEROBIC Blood Culture adequate volume   Culture   Final    NO GROWTH 5 DAYS Performed at [facility], [place] [address] [place], [place] [address]    Report Status 10/02/2020 FINAL  Final  MRSA PCR Screening     Status: None   Collection Time: 09/28/20  4:14 AM   Specimen: Nasal Mucosa; Nasopharyngeal  Result Value Ref Range Status   MRSA by PCR NEGATIVE NEGATIVE Final    Comment:        The GeneXpert MRSA Assay (FDA approved for NASAL specimens only), is one component of a comprehensive MRSA colonization surveillance program. It is not intended to diagnose MRSA infection nor to guide or monitor treatment for MRSA infections. Performed at [facility], [place] [address], [place], [place] [address]     Radiology Reports CT ANGIO CHEST PE W OR WO CONTRAST  Result Date: 09/27/2020 CLINICAL DATA:  54 year old male with history of COVID-19, suspected pulmonary embolism. EXAM: CT ANGIOGRAPHY CHEST WITH CONTRAST TECHNIQUE: Multidetector CT imaging of the chest was performed using the standard protocol during bolus administration of intravenous contrast. Multiplanar CT image reconstructions and MIPs were obtained to evaluate the vascular anatomy. CONTRAST:  100 mL Omnipaque 350, intravenous COMPARISON:  Chest CT from 11/17/2019 FINDINGS: Cardiovascular: Satisfactory opacification of the pulmonary arteries to the segmental level. No evidence of pulmonary embolism. Normal heart size. No pericardial effusion. The heart is normal in size. No pericardial effusion. Severe coronary atherosclerotic calcifications. Mediastinum/Nodes: Similar appearing prominent right paratracheal lymph node measuring up to 1.2 cm in short axis. Otherwise no enlarged mediastinal, hilar, or axillary lymph nodes. Thyroid gland, trachea, and esophagus demonstrate no  significant findings. Lungs/Pleura: Interval development of near diffuse, peripherally predominant ground-glass pulmonary opacities with a mid lung and basal predominance. Similar appearing moderate upper lobe predominant paraseptal and centrilobular emphysema. No pleural effusion or pneumothorax. Upper Abdomen: The visualized upper abdomen is within normal limits. Musculoskeletal: No chest wall abnormality. No acute or significant osseous findings. Review of the MIP images confirms the above findings. IMPRESSION: Vascular: 1. No evidence of pulmonary embolism. 2. Severe coronary atherosclerotic calcifications. Non-Vascular: 1. Diffuse, mid lung and basilar, peripherally predominant ground-glass opacities - findings compatible with multifocal pneumonia associated with COVID-19. 2. Similar appearing moderate upper lobe predominant centrilobular and paraseptal emphysema. Electronically Signed   By: Ruthann Cancer MD   On: 09/27/2020 14:31   DG Chest Port 1 View  Result Date: 10/06/2020 CLINICAL DATA:  COVID positive, pleural effusion, pneumothorax EXAM: PORTABLE CHEST 1 VIEW COMPARISON:  09/26/2020 FINDINGS: Slight interval increase in diffuse bilateral heterogeneous and interstitial airspace opacity, most conspicuous at the lung bases. No significant pleural effusion noted. No pneumothorax. The heart and mediastinum are unremarkable. IMPRESSION: 1. Slight interval increase in diffuse bilateral heterogeneous and interstitial airspace opacity, most conspicuous at the lung bases, consistent with worsened COVID airspace disease. 2.  No significant pleural effusion noted. No pneumothorax. Electronically Signed   By: Eddie Candle M.D.   On: 10/06/2020 09:37   DG CHEST PORT 1 VIEW  Result Date: 09/26/2020 CLINICAL DATA:  COVID-19 EXAM: PORTABLE CHEST 1 VIEW COMPARISON:  09/26/2020 FINDINGS: Slight worsening of bilateral basilar predominant opacities. No pleural effusion or pneumothorax. Normal cardiomediastinal  contours. IMPRESSION: Slight worsening of bilateral basilar predominant opacities. Electronically Signed   By: Ulyses Jarred M.D.   On: 09/26/2020 23:26   DG Chest Portable 1 View  Result Date: 09/26/2020 CLINICAL DATA:  Cough and shortness of breath. EXAM: PORTABLE CHEST 1 VIEW COMPARISON:  12/04/2019 FINDINGS: Cardiomegaly which is increased from prior exam. Pulmonary edema, moderate in degree. There are bilateral pleural effusions. No confluent consolidation. No pneumothorax. No acute osseous abnormalities are seen. IMPRESSION: CHF with cardiomegaly, pulmonary edema and bilateral pleural effusions. Electronically Signed   By: Keith Rake M.D.   On: 09/26/2020 19:14   ECHOCARDIOGRAM LIMITED  Result Date: 09/28/2020    ECHOCARDIOGRAM LIMITED REPORT   Patient Name:   Donathan Loletha Grayer Rance Date of Exam: 09/28/2020 Medical Rec #:  [id number]          Height:       71.0 in Accession #:    [id number]         Weight:       244.3 lb Date of Birth:  September 11, 1966          BSA:          2.296 m Patient Age:    54 years           BP:           120/84 mmHg Patient Gender: M                  HR:           73 bpm. Exam Location:  Inpatient Procedure: Limited Echo, Limited Color Doppler and Cardiac Doppler Indications:    acute respiratory distress  History:        Patient has prior history of Echocardiogram examinations, most                 recent 03/30/2016. CAD; Covid.  Sonographer:    Johny Chess Referring Phys: [id number] Candace Gallus MELVIN  Sonographer Comments: Image acquisition challenging due to respiratory motion. IMPRESSIONS  1. Left ventricular ejection fraction, by estimation, is 60 to 65%. The left ventricle has normal function. The left ventricle has no regional wall motion abnormalities. There is mild left ventricular hypertrophy.  2. Right ventricular systolic function is normal. The right ventricular size is normal.  3. The mitral valve is normal in structure. No evidence of mitral valve  regurgitation. No evidence of mitral stenosis.  4. The aortic valve is tricuspid.  5. The inferior vena cava is normal in size with greater than 50% respiratory variability, suggesting right atrial pressure of 3 mmHg. FINDINGS  Left Ventricle: Left ventricular ejection fraction, by estimation, is 60 to 65%. The left ventricle has normal function. The left ventricle has no regional wall motion abnormalities. The left ventricular internal cavity size was normal in size. There is  mild left ventricular hypertrophy. Right Ventricle: The right ventricular size is normal. No increase in right ventricular wall thickness. Right ventricular systolic function is normal. Pericardium: There is no evidence of pericardial effusion. Mitral Valve: The mitral valve is normal in structure. No evidence of mitral valve stenosis. Tricuspid Valve: The tricuspid valve is normal in structure. Tricuspid valve regurgitation is not demonstrated. No evidence of tricuspid stenosis. Aortic Valve: The aortic valve is tricuspid. Pulmonic Valve: The pulmonic valve was not well visualized. Pulmonic valve regurgitation is not visualized. No evidence of pulmonic stenosis. Aorta: The aortic root is normal in size and structure. Pulmonary Artery: Indeterminant PASP, inadequate TR jet. Venous: The inferior vena cava is normal in size with greater than 50% respiratory variability, suggesting right atrial pressure of 3 mmHg. IAS/Shunts: No atrial level shunt detected by color flow Doppler. LEFT  VENTRICLE PLAX 2D LVIDd:         4.20 cm LVIDs:         2.80 cm LV PW:         1.30 cm LV IVS:        1.10 cm LVOT diam:     2.00 cm LV SV:         71 LV SV Index:   31 LVOT Area:     3.14 cm  IVC IVC diam: 1.70 cm LEFT ATRIUM         Index LA diam:    4.30 cm 1.87 cm/m  AORTIC VALVE LVOT Vmax:   116.00 cm/s LVOT Vmean:  76.100 cm/s LVOT VTI:    0.227 m  AORTA Ao Root diam: 3.10 cm MITRAL VALVE MV Area (PHT): 2.91 cm    SHUNTS MV Decel Time: 261 msec    Systemic  VTI:  0.23 m MV E velocity: 75.40 cm/s  Systemic Diam: 2.00 cm MV A velocity: 66.00 cm/s MV E/A ratio:  1.14 Carlyle Dolly MD Electronically signed by Carlyle Dolly MD Signature Date/Time: 09/28/2020/2:52:33 PM    Final     Lab Data:  CBC: Recent Labs  Lab 09/30/20 0430 10/01/20 0119 10/02/20 0057 10/03/20 0112 10/04/20 0030 10/05/20 0149 10/06/20 0509  WBC 10.7* 12.2* 13.5* 13.6* 12.4* 16.9* 14.0*  NEUTROABS 9.2* 11.0*  --   --   --   --   --   HGB 14.3 14.6 15.5 15.1 14.8 14.3 14.1  HCT 42.5 41.7 43.5 41.6 43.6 41.2 39.3  MCV 90.4 87.8 88.2 87.0 89.0 88.4 88.5  PLT 184 204 238 244 239 245 [id number]   Basic Metabolic Panel: Recent Labs  Lab 09/30/20 0430 10/01/20 0119 10/02/20 0057 10/03/20 0112 10/04/20 0030 10/05/20 0149 10/06/20 0509  NA 135 134* 134* 132* 132* 132* 131*  K 4.4 4.9 4.8 5.0 5.1 4.9 4.7  CL 101 104 104 102 105 101 100  CO2 24 20* 19* 18* 18* 22 23  GLUCOSE 185* 169* 158* 179* 194* 165* 161*  BUN 33* 28* 27* 28* 30* 26* 19  CREATININE 1.08 1.03 0.97 0.93 1.10 1.06 0.97  CALCIUM 8.2* 8.3* 8.5* 8.5* 8.2* 8.3* 8.2*  MG 2.8* 2.8*  --   --   --   --   --   PHOS 3.5 2.7  --   --   --   --   --    GFR: Estimated Creatinine Clearance: 106.4 mL/min (by C-G formula based on SCr of 0.97 mg/dL). Liver Function Tests: Recent Labs  Lab 10/02/20 0057 10/03/20 0112 10/04/20 0030 10/05/20 0149 10/06/20 0509  AST 26 18 18 22 20   ALT 95* 67* 58* 58* 45*  ALKPHOS 105 90 84 80 79  BILITOT 1.2 1.4* 1.4* 1.4* 0.9  PROT 6.6 6.5 6.3* 6.2* 6.1*  ALBUMIN 3.0* 2.9* 2.8* 2.7* 2.5*   No results for input(s): LIPASE, AMYLASE in the last 168 hours. No results for input(s): AMMONIA in the last 168 hours. Coagulation Profile: No results for input(s): INR, PROTIME in the last 168 hours. Cardiac Enzymes: No results for input(s): CKTOTAL, CKMB, CKMBINDEX, TROPONINI in the last 168 hours. BNP (last 3 results) No results for input(s): PROBNP in the last 8760  hours. HbA1C: No results for input(s): HGBA1C in the last 72 hours. CBG: No results for input(s): GLUCAP in the last 168 hours. Lipid Profile: No results for input(s): CHOL, HDL, LDLCALC, TRIG, CHOLHDL, LDLDIRECT in the last 72  hours. Thyroid Function Tests: No results for input(s): TSH, T4TOTAL, FREET4, T3FREE, THYROIDAB in the last 72 hours. Anemia Panel: No results for input(s): VITAMINB12, FOLATE, FERRITIN, TIBC, IRON, RETICCTPCT in the last 72 hours. Urine analysis: No results found for: COLORURINE, APPEARANCEUR, LABSPEC, PHURINE, GLUCOSEU, HGBUR, BILIRUBINUR, KETONESUR, PROTEINUR, UROBILINOGEN, NITRITE, Louie Boston [place]ah Vandergriff M.D. [facility] 10/06/2020, 3:29 PM   Call night coverage person covering after 7pm

## 2020-10-07 NOTE — Progress Notes (Signed)
[facility]                                                                              Patient Demographics  Matthew [name], is a 54 y.o. male, DOB - 02-11-1966, [id number]  Admit date - 09/26/2020   Admitting Physician Marcelyn Bruins, MD  Outpatient Primary MD for the patient is Patient, No Pcp Per  Outpatient specialists:   LOS - 11  days   Medical records reviewed and are as summarized below:    No chief complaint on file.      Brief summary   Patient is a 54 year old male with history of CAD status post stent x2, tobacco use (78-pack-year history, quit in February, 2021),  HLP, GERD, presented with shortness of breath.  Patient had gradual worsening of shortness of breath since 12/9 and GI illness from around 12/10-12/14.  He was seen at the urgent care for diarrhea, nausea and vomiting.  Patient was treated with fluids and supportive care, had declined Covid test at that time.  As shortness of breath progressively worsened, patient called the EMS, was found to have O2 sats 78% on room air with significant rails.  Patient was placed on 15 L NRB with improvement to 90%.  Vaccination status: Patient is unvaccinated for COVID-19  Matthew [name] was admitted due to hypoxic respiratory failure from severe COVID-19 pneumonia  Subjective:   Matthew [name] was seen and examined today.  He reports a good night sleep, he denies any complaints, no chest pain, he reports dyspnea at baseline .  Assessment & Plan   Acute hypoxic respiratory failure due to acute COVID-19 viral pneumonia during the ongoing COVID-19 pandemic- POA -He is unvaccinated - Patient presented with worsening shortness of breath, found to be hypoxic, chest x-ray showed pulmonary edema with effusions, Covid test positive, unvaccinated.   - Continue IV Solu-Medrol, 60 mg every 12 hours. -Treated with IV remdesivir . -Continue with baricitinib  - CT angiogram chest negative for PE - Continue  Supportive care: vitamin C/zinc, albuterol, Tylenol, I-S, flutter valve, proning advised. -He remains with significant oxygen requirement, this morning he is on 12 L high flow nasal cannula. -Encouraged use incentive spirometry, flutter valve, to prone and out of bed to chair. -He is with significant anxiety, much improved with xanax.  - Oxygen - SpO2: 92 % O2 Flow Rate (L/min): 10 L/min (on HFNC) FiO2 (%): (!) 15 % HFNC - Continue to follow labs as below  Lab Results  Component Value Date   SARSCOV2NAA POSITIVE (A) 09/26/2020   SARSCOV2NAA NEGATIVE 02/06/2020   [place] NEGATIVE 11/17/2019     Recent Labs  Lab 10/01/20 0119 10/02/20 0057 10/03/20 0112 10/04/20 0030 10/05/20 0149 10/06/20 0509  DDIMER 1.76* 1.57* 2.36* 1.93* 1.64* 1.71*  FERRITIN 2,102*  --   --   --   --   --   CRP 0.9  --   --   --   --   --   ALT 131* 95* 67* 58* 58* 45*   Acute CHF with a preserved EF, with cardiomegaly, pulmonary edema, bilateral pleural effusion -Received Lasix 60 mg IV x1  in ED, BNP 23.4, troponin 9 -2D echo with a preserved EF -Initially on scheduled Lasix, but he does appear to be a euvolemic currently, he is with normal BNP today, far no indication for Lasix.  History of CAD status post stents x2 -Troponins negative, currently no chest pain, not on any home medications for this  Acute on chronic thrombocytopenia -Possibly worsened due to COVID-19, platelets 88K on admission, resolved  Transaminitis -Due to Covid, stable, monitor closely.  GERD Continue PPI I  Mild acute kidney injury  -Resolved  History of hemoptysis complicated by epistaxis in 11/2019 -Follows Dr. Mora Bellman, was found to have clot in LUL bronchus  Constipation - he was encouraged to take his laxatives.  Anxiety -On as needed Xanax  Obesity Estimated body mass index is 32.07 kg/m as calculated from the following:   Height as of this encounter: 5\' 11"  (1.803 m).   Weight as of this  encounter: 104.3 kg.  Code Status: Full CODE STATUS DVT Prophylaxis: Lovenox Family Communication: Discussed with patient's wife patient's wife, Eryck Negron [phone] (she is also covid positive) by phone on 12/27  Disposition Plan:     Status is: Inpatient  Remains inpatient appropriate because:Inpatient level of care appropriate due to severity of illness   Dispo: The patient is from: Home              Anticipated d/c is to: Home              Anticipated d/c date is: > 3 days              Patient currently is not medically stable to d/c.  Procedures:  2D echo  Consultants:   None  Antimicrobials:   Anti-infectives (From admission, onward)   Start     Dose/Rate Route Frequency Ordered Stop   09/27/20 1000  remdesivir 100 mg in sodium chloride 0.9 % 100 mL IVPB       "Followed by" Linked Group Details   100 mg 200 mL/hr over 30 Minutes Intravenous Daily 09/26/20 2208 09/30/20 1900   09/26/20 2215  remdesivir 200 mg in sodium chloride 0.9% 250 mL IVPB       "Followed by" Linked Group Details   200 mg 580 mL/hr over 30 Minutes Intravenous Once 09/26/20 2208 09/27/20 0146         Medications  Scheduled Meds: . baricitinib  4 mg Oral Daily  . enoxaparin (LOVENOX) injection  55 mg Subcutaneous Daily  . feeding supplement  237 mL Oral BID BM  . melatonin  3 mg Oral QHS  . methylPREDNISolone (SOLU-MEDROL) injection  60 mg Intravenous BID  . pantoprazole  40 mg Oral Daily  . polyethylene glycol  17 g Oral Once  . sodium chloride flush  3 mL Intravenous Q12H   Continuous Infusions:  PRN Meds:.acetaminophen, ALPRAZolam, guaiFENesin-dextromethorphan, ondansetron (ZOFRAN) IV, polyethylene glycol, sodium chloride        Objective:   Vitals:   10/07/20 0427 10/07/20 0500 10/07/20 0622 10/07/20 0804  BP: 98/71  (!) 96/58 92/61  Pulse: 63  70 71  Resp:    20  Temp: 98.2 F (36.8 C)   (!) 97.3 F (36.3 C)  TempSrc: Oral   Axillary  SpO2: 100%  93% 92%   Weight:  104.3 kg    Height:        Intake/Output Summary (Last 24 hours) at 10/07/2020 1034 Last data filed at 10/07/2020 0921 Gross per 24 hour  Intake 363 ml  Output 200 ml  Net 163 ml     Wt Readings from Last 3 Encounters:  10/07/20 104.3 kg  12/04/19 108.4 kg  11/20/19 107.9 kg    Physical Exam  Awake Alert, Oriented X 3, No new F.N deficits, Normal affect Symmetrical Chest wall movement, Good air movement bilaterally, CTAB RRR,No Gallops,Rubs or new Murmurs, No Parasternal Heave +ve B.Sounds, Abd Soft, No tenderness, No rebound - guarding or rigidity. No Cyanosis, Clubbing or edema, No new Rash or bruise     Data Reviewed:  I have personally reviewed following labs and imaging studies  Micro Results Recent Results (from the past 240 hour(s))  MRSA PCR Screening     Status: None   Collection Time: 09/28/20  4:14 AM   Specimen: Nasal Mucosa; Nasopharyngeal  Result Value Ref Range Status   MRSA by PCR NEGATIVE NEGATIVE Final    Comment:        The GeneXpert MRSA Assay (FDA approved for NASAL specimens only), is one component of a comprehensive MRSA colonization surveillance program. It is not intended to diagnose MRSA infection nor to guide or monitor treatment for MRSA infections. Performed at [facility], [place] [address], [place], [place] [address]     Radiology Reports CT ANGIO CHEST PE W OR WO CONTRAST  Result Date: 09/27/2020 CLINICAL DATA:  54 year old male with history of COVID-19, suspected pulmonary embolism. EXAM: CT ANGIOGRAPHY CHEST WITH CONTRAST TECHNIQUE: Multidetector CT imaging of the chest was performed using the standard protocol during bolus administration of intravenous contrast. Multiplanar CT image reconstructions and MIPs were obtained to evaluate the vascular anatomy. CONTRAST:  100 mL Omnipaque 350, intravenous COMPARISON:  Chest CT from 11/17/2019 FINDINGS: Cardiovascular: Satisfactory opacification of the pulmonary arteries  to the segmental level. No evidence of pulmonary embolism. Normal heart size. No pericardial effusion. The heart is normal in size. No pericardial effusion. Severe coronary atherosclerotic calcifications. Mediastinum/Nodes: Similar appearing prominent right paratracheal lymph node measuring up to 1.2 cm in short axis. Otherwise no enlarged mediastinal, hilar, or axillary lymph nodes. Thyroid gland, trachea, and esophagus demonstrate no significant findings. Lungs/Pleura: Interval development of near diffuse, peripherally predominant ground-glass pulmonary opacities with a mid lung and basal predominance. Similar appearing moderate upper lobe predominant paraseptal and centrilobular emphysema. No pleural effusion or pneumothorax. Upper Abdomen: The visualized upper abdomen is within normal limits. Musculoskeletal: No chest wall abnormality. No acute or significant osseous findings. Review of the MIP images confirms the above findings. IMPRESSION: Vascular: 1. No evidence of pulmonary embolism. 2. Severe coronary atherosclerotic calcifications. Non-Vascular: 1. Diffuse, mid lung and basilar, peripherally predominant ground-glass opacities - findings compatible with multifocal pneumonia associated with COVID-19. 2. Similar appearing moderate upper lobe predominant centrilobular and paraseptal emphysema. Electronically Signed   By: Ruthann Cancer MD   On: 09/27/2020 14:31   DG Chest Port 1 View  Result Date: 10/06/2020 CLINICAL DATA:  COVID positive, pleural effusion, pneumothorax EXAM: PORTABLE CHEST 1 VIEW COMPARISON:  09/26/2020 FINDINGS: Slight interval increase in diffuse bilateral heterogeneous and interstitial airspace opacity, most conspicuous at the lung bases. No significant pleural effusion noted. No pneumothorax. The heart and mediastinum are unremarkable. IMPRESSION: 1. Slight interval increase in diffuse bilateral heterogeneous and interstitial airspace opacity, most conspicuous at the lung bases,  consistent with worsened COVID airspace disease. 2.  No significant pleural effusion noted. No pneumothorax. Electronically Signed   By: Eddie Candle M.D.   On: 10/06/2020 09:37   DG CHEST PORT 1 VIEW  Result Date:  09/26/2020 CLINICAL DATA:  COVID-19 EXAM: PORTABLE CHEST 1 VIEW COMPARISON:  09/26/2020 FINDINGS: Slight worsening of bilateral basilar predominant opacities. No pleural effusion or pneumothorax. Normal cardiomediastinal contours. IMPRESSION: Slight worsening of bilateral basilar predominant opacities. Electronically Signed   By: Deatra Robinson M.D.   On: 09/26/2020 23:26   DG Chest Portable 1 View  Result Date: 09/26/2020 CLINICAL DATA:  Cough and shortness of breath. EXAM: PORTABLE CHEST 1 VIEW COMPARISON:  12/04/2019 FINDINGS: Cardiomegaly which is increased from prior exam. Pulmonary edema, moderate in degree. There are bilateral pleural effusions. No confluent consolidation. No pneumothorax. No acute osseous abnormalities are seen. IMPRESSION: CHF with cardiomegaly, pulmonary edema and bilateral pleural effusions. Electronically Signed   By: Narda Rutherford M.D.   On: 09/26/2020 19:14   ECHOCARDIOGRAM LIMITED  Result Date: 09/28/2020    ECHOCARDIOGRAM LIMITED REPORT   Patient Name:   Momen Salena Saner Harbeck Date of Exam: 09/28/2020 Medical Rec #:  [id number]          Height:       71.0 in Accession #:    [id number]         Weight:       244.3 lb Date of Birth:  1966-02-11          BSA:          2.296 m Patient Age:    54 years           BP:           120/84 mmHg Patient Gender: M                  HR:           73 bpm. Exam Location:  Inpatient Procedure: Limited Echo, Limited Color Doppler and Cardiac Doppler Indications:    acute respiratory distress  History:        Patient has prior history of Echocardiogram examinations, most                 recent 03/30/2016. CAD; Covid.  Sonographer:    Delcie Roch Referring Phys: [id number] Cecille Po MELVIN  Sonographer Comments: Image acquisition  challenging due to respiratory motion. IMPRESSIONS  1. Left ventricular ejection fraction, by estimation, is 60 to 65%. The left ventricle has normal function. The left ventricle has no regional wall motion abnormalities. There is mild left ventricular hypertrophy.  2. Right ventricular systolic function is normal. The right ventricular size is normal.  3. The mitral valve is normal in structure. No evidence of mitral valve regurgitation. No evidence of mitral stenosis.  4. The aortic valve is tricuspid.  5. The inferior vena cava is normal in size with greater than 50% respiratory variability, suggesting right atrial pressure of 3 mmHg. FINDINGS  Left Ventricle: Left ventricular ejection fraction, by estimation, is 60 to 65%. The left ventricle has normal function. The left ventricle has no regional wall motion abnormalities. The left ventricular internal cavity size was normal in size. There is  mild left ventricular hypertrophy. Right Ventricle: The right ventricular size is normal. No increase in right ventricular wall thickness. Right ventricular systolic function is normal. Pericardium: There is no evidence of pericardial effusion. Mitral Valve: The mitral valve is normal in structure. No evidence of mitral valve stenosis. Tricuspid Valve: The tricuspid valve is normal in structure. Tricuspid valve regurgitation is not demonstrated. No evidence of tricuspid stenosis. Aortic Valve: The aortic valve is tricuspid. Pulmonic Valve: The pulmonic valve was not well visualized. Pulmonic valve  regurgitation is not visualized. No evidence of pulmonic stenosis. Aorta: The aortic root is normal in size and structure. Pulmonary Artery: Indeterminant PASP, inadequate TR jet. Venous: The inferior vena cava is normal in size with greater than 50% respiratory variability, suggesting right atrial pressure of 3 mmHg. IAS/Shunts: No atrial level shunt detected by color flow Doppler. LEFT VENTRICLE PLAX 2D LVIDd:         4.20 cm  LVIDs:         2.80 cm LV PW:         1.30 cm LV IVS:        1.10 cm LVOT diam:     2.00 cm LV SV:         71 LV SV Index:   31 LVOT Area:     3.14 cm  IVC IVC diam: 1.70 cm LEFT ATRIUM         Index LA diam:    4.30 cm 1.87 cm/m  AORTIC VALVE LVOT Vmax:   116.00 cm/s LVOT Vmean:  76.100 cm/s LVOT VTI:    0.227 m  AORTA Ao Root diam: 3.10 cm MITRAL VALVE MV Area (PHT): 2.91 cm    SHUNTS MV Decel Time: 261 msec    Systemic VTI:  0.23 m MV E velocity: 75.40 cm/s  Systemic Diam: 2.00 cm MV A velocity: 66.00 cm/s MV E/A ratio:  1.14 Carlyle Dolly MD Electronically signed by Carlyle Dolly MD Signature Date/Time: 09/28/2020/2:52:33 PM    Final     Lab Data:  CBC: Recent Labs  Lab 10/01/20 0119 10/02/20 0057 10/03/20 0112 10/04/20 0030 10/05/20 0149 10/06/20 0509  WBC 12.2* 13.5* 13.6* 12.4* 16.9* 14.0*  NEUTROABS 11.0*  --   --   --   --   --   HGB 14.6 15.5 15.1 14.8 14.3 14.1  HCT 41.7 43.5 41.6 43.6 41.2 39.3  MCV 87.8 88.2 87.0 89.0 88.4 88.5  PLT 204 238 244 239 245 [id number]   Basic Metabolic Panel: Recent Labs  Lab 10/01/20 0119 10/02/20 0057 10/03/20 0112 10/04/20 0030 10/05/20 0149 10/06/20 0509  NA 134* 134* 132* 132* 132* 131*  K 4.9 4.8 5.0 5.1 4.9 4.7  CL 104 104 102 105 101 100  CO2 20* 19* 18* 18* 22 23  GLUCOSE 169* 158* 179* 194* 165* 161*  BUN 28* 27* 28* 30* 26* 19  CREATININE 1.03 0.97 0.93 1.10 1.06 0.97  CALCIUM 8.3* 8.5* 8.5* 8.2* 8.3* 8.2*  MG 2.8*  --   --   --   --   --   PHOS 2.7  --   --   --   --   --    GFR: Estimated Creatinine Clearance: 104.5 mL/min (by C-G formula based on SCr of 0.97 mg/dL). Liver Function Tests: Recent Labs  Lab 10/02/20 0057 10/03/20 0112 10/04/20 0030 10/05/20 0149 10/06/20 0509  AST 26 18 18 22 20   ALT 95* 67* 58* 58* 45*  ALKPHOS 105 90 84 80 79  BILITOT 1.2 1.4* 1.4* 1.4* 0.9  PROT 6.6 6.5 6.3* 6.2* 6.1*  ALBUMIN 3.0* 2.9* 2.8* 2.7* 2.5*   No results for input(s): LIPASE, AMYLASE in the last 168 hours. No  results for input(s): AMMONIA in the last 168 hours. Coagulation Profile: No results for input(s): INR, PROTIME in the last 168 hours. Cardiac Enzymes: No results for input(s): CKTOTAL, CKMB, CKMBINDEX, TROPONINI in the last 168 hours. BNP (last 3 results) No results for input(s): PROBNP in the last 8760  hours. HbA1C: No results for input(s): HGBA1C in the last 72 hours. CBG: No results for input(s): GLUCAP in the last 168 hours. Lipid Profile: No results for input(s): CHOL, HDL, LDLCALC, TRIG, CHOLHDL, LDLDIRECT in the last 72 hours. Thyroid Function Tests: No results for input(s): TSH, T4TOTAL, FREET4, T3FREE, THYROIDAB in the last 72 hours. Anemia Panel: No results for input(s): VITAMINB12, FOLATE, FERRITIN, TIBC, IRON, RETICCTPCT in the last 72 hours. Urine analysis: No results found for: COLORURINE, APPEARANCEUR, LABSPEC, PHURINE, GLUCOSEU, HGBUR, BILIRUBINUR, KETONESUR, PROTEINUR, UROBILINOGEN, NITRITE, Louie Boston Jasson Siegmann M.D. [facility] 10/07/2020, 10:34 AM   Call night coverage person covering after 7pm

## 2020-10-07 NOTE — Plan of Care (Signed)
  Problem: Education: Goal: Knowledge of risk factors and measures for prevention of condition will improve Outcome: Progressing   Problem: Coping: Goal: Psychosocial and spiritual needs will be supported Outcome: Progressing   Problem: Respiratory: Goal: Will maintain a patent airway Outcome: Progressing Goal: Complications related to the disease process, condition or treatment will be avoided or minimized Outcome: Progressing   

## 2020-10-07 NOTE — Progress Notes (Signed)
Physical Therapy Treatment Patient Details Name: Matthew [name] MRN: [id number] DOB: [birth year] Today's Date: 10/07/2020    History of Present Illness Pt is a 54 y.o. male admitted 09/26/20 with SOB and GI distress (had declined COVID test when seen in urgent care). Pt now testing (+) COVID-19. Workup for acute hypoxic respiratory failure due to acute COVID-19 viral PNA. PMH includes CAD, CHF, tobacco use (quit 11/2019); pt unvaccinated.    PT Comments    Pt limited by respiratory status and anxiety.  He was able to march in place for 30 seconds with supervision but sats dropped to 80% on non-rebreather mask at 15L , requiring 1 min to recover to 90% on 15 L NRB and 10 L HFNC.  Pt educated on relaxation techniques and importance of mobility and increasing as able.  Pt expressed disappointment that he was not able to ambulate as far today- discussed did similar time of activity, but some limitations due to anxiousness initially and that he may have times that he is able to do more than other times.  Continue to progress as able.    Follow Up Recommendations  No PT follow up     Equipment Recommendations  None recommended by PT    Recommendations for Other Services       Precautions / Restrictions Precautions Precautions: Fall;Other (comment) Precaution Comments: becomes anxious with increased O2 demands    Mobility  Bed Mobility               General bed mobility comments: Received sitting in recliner  Transfers Overall transfer level: Needs assistance Equipment used: Rolling walker (2 wheeled) Transfers: Sit to/from Stand Sit to Stand: Supervision         General transfer comment: Pt requesting use of RW for stability; supervision for safety  Ambulation/Gait Ambulation/Gait assistance: Supervision       Gait velocity: Decreased   General Gait Details: There was not a rolling O2 carrier anywhere on 5W available.  Obtained carrier but without wheels.  Pt  reports doesn't trust this carrier and would also prefer to work with therapist he has seen in past. Reassured pt of this therapist experience in working with pt's with COVID and that his therapist is not here this week so would likely see other therapist. Also, discussed since O2 carrier not available could just do standing activity.  Pt agreed but was anxious.  Stood and marched in place for 30 seconds with cues for deep slow breaths and supervision.  Fatigued easily.  See below for sats   Stairs             Wheelchair Mobility    Modified Rankin (Stroke Patients Only)       Balance Overall balance assessment: Needs assistance   Sitting balance-Leahy Scale: Good       Standing balance-Leahy Scale: Fair Standing balance comment: Utilized RW but able to static stand without support                            Cognition Arousal/Alertness: Awake/alert Behavior During Therapy: Anxious Overall Cognitive Status: Within Functional Limits for tasks assessed                                 General Comments: Pt expresses frustration with situation (new therapist, O2 carrier not available, COVID)      Exercises  General Comments General comments (skin integrity, edema, etc.): Pt was on 10 L HFNC with occasional use of non rebreather at rest with sats mid 90's.  Performed standing activity with 15L on NRB with sats dropping to 80%. Applied 10 L HFNC and 15L NRB with sats up to 90% in 1 minute and 97 % in 3 mins with cues for relaxation and slow deep breaths.  Able to decrease back to 10 L HFNC only with sats 93%.      Pertinent Vitals/Pain Pain Assessment: No/denies pain    Home Living                      Prior Function            PT Goals (current goals can now be found in the care plan section) Acute Rehab PT Goals Patient Stated Goal: to walk out of here PT Goal Formulation: With patient Time For Goal Achievement:  10/15/20 Potential to Achieve Goals: Good Progress towards PT goals: Not progressing toward goals - comment (resp status)    Frequency    Min 3X/week      PT Plan Current plan remains appropriate    Co-evaluation              AM-PAC PT "6 Clicks" Mobility   Outcome Measure  Help needed turning from your back to your side while in a flat bed without using bedrails?: None Help needed moving from lying on your back to sitting on the side of a flat bed without using bedrails?: None Help needed moving to and from a bed to a chair (including a wheelchair)?: A Little Help needed standing up from a chair using your arms (e.g., wheelchair or bedside chair)?: A Little Help needed to walk in hospital room?: A Little Help needed climbing 3-5 steps with a railing? : A Little 6 Click Score: 20    End of Session Equipment Utilized During Treatment: Oxygen Activity Tolerance: Treatment limited secondary to medical complications (Comment) (O2 sats and anxiousness) Patient left: in chair;with call bell/phone within reach Nurse Communication: Mobility status PT Visit Diagnosis: Other abnormalities of gait and mobility (R26.89)     Time: 1210-1230 PT Time Calculation (min) (ACUTE ONLY): 20 min  Charges:  $Therapeutic Activity: 8-22 mins                     Matthew [name], PT Acute Rehab Services Pager [phone] [facility] [phone]     Matthew [name] 10/07/2020, 2:10 PM

## 2020-10-08 ENCOUNTER — Encounter: Payer: Self-pay | Admitting: *Deleted

## 2020-10-08 LAB — COMPREHENSIVE METABOLIC PANEL
ALT: 45 U/L — ABNORMAL HIGH (ref 0–44)
AST: 21 U/L (ref 15–41)
Albumin: 2.5 g/dL — ABNORMAL LOW (ref 3.5–5.0)
Alkaline Phosphatase: 82 U/L (ref 38–126)
Anion gap: 10 (ref 5–15)
BUN: 21 mg/dL — ABNORMAL HIGH (ref 6–20)
CO2: 21 mmol/L — ABNORMAL LOW (ref 22–32)
Calcium: 8.3 mg/dL — ABNORMAL LOW (ref 8.9–10.3)
Chloride: 99 mmol/L (ref 98–111)
Creatinine, Ser: 0.89 mg/dL (ref 0.61–1.24)
GFR, Estimated: >60 mL/min (ref >60)
Glucose, Bld: 173 mg/dL — ABNORMAL HIGH (ref 70–99)
Potassium: 4.9 mmol/L (ref 3.5–5.1)
Sodium: 130 mmol/L — ABNORMAL LOW (ref 135–145)
Total Bilirubin: 0.7 mg/dL (ref 0.3–1.2)
Total Protein: 6.1 g/dL — ABNORMAL LOW (ref 6.5–8.1)

## 2020-10-08 LAB — CBC
HCT: 40 % (ref 39.0–52.0)
Hemoglobin: 14 g/dL (ref 13.0–17.0)
MCH: 31 pg (ref 26.0–34.0)
MCHC: 35 g/dL (ref 30.0–36.0)
MCV: 88.7 fL (ref 80.0–100.0)
Platelets: 254 10*3/uL (ref 150–400)
RBC: 4.51 MIL/uL (ref 4.22–5.81)
RDW: 13.1 % (ref 11.5–15.5)
WBC: 14.4 10*3/uL — ABNORMAL HIGH (ref 4.0–10.5)
nRBC: 0 % (ref 0.0–0.2)

## 2020-10-08 MED ORDER — ALPRAZOLAM 0.5 MG PO TABS
0.5000 mg | ORAL_TABLET | Freq: Three times a day (TID) | ORAL | Status: DC | PRN
  Administered 2020-10-08 – 2020-10-11 (×9): 0.5 mg via ORAL
  Filled 2020-10-08 (×9): qty 1

## 2020-10-08 MED ORDER — HYDROCOD POLST-CPM POLST ER 10-8 MG/5ML PO SUER
5.0000 mL | Freq: Two times a day (BID) | ORAL | Status: DC | PRN
  Filled 2020-10-08: qty 5

## 2020-10-08 NOTE — Progress Notes (Signed)
[facility]                                                                              Matthew [name] Demographics  Matthew [name], is a 54 y.o. male, DOB - [birth year], [id number]  Admit date - 09/26/2020   Admitting Physician Marcelyn Bruins, MD  Outpatient Primary MD for the Matthew [name] is Matthew [name], No Pcp Per  Outpatient specialists:   LOS - 12  days   Medical records reviewed and are as summarized below:    No chief complaint on file.      Brief summary   Matthew [name] is a 54 year old male with history of CAD status post stent x2, tobacco use (78-pack-year history, quit in February, 2021),  HLP, GERD, presented with shortness of breath.  Matthew [name] had gradual worsening of shortness of breath since 12/9 and GI illness from around 12/10-12/14.  He was seen at the urgent care for diarrhea, nausea and vomiting.  Matthew [name] was treated with fluids and supportive care, had declined Covid test at that time.  As shortness of breath progressively worsened, Matthew [name] called the EMS, was found to have O2 sats 78% on room air with significant rails.  Matthew [name] was placed on 15 L NRB with improvement to 90%.  Vaccination status: Matthew [name] is unvaccinated for COVID-19  HE was admitted due to hypoxic respiratory failure from severe COVID-19 pneumonia  Subjective:   [name] was seen and examined today.  Reports dyspnea and cough at baseline, reports good night sleep, denies any new complaints , he has been alternating between NRB and HFNC due to nasal irritation.  .   Assessment & Plan   Acute hypoxic respiratory failure due to acute COVID-19 viral pneumonia during the ongoing COVID-19 pandemic- POA -He is unvaccinated - Matthew [name] presented with worsening shortness of breath, found to be hypoxic, chest x-ray showed pulmonary edema with effusions, Covid test positive, unvaccinated.   -Continue with IV Solu-Medrol, decrease to 40 mg IV twice daily. -Treated with IV remdesivir  . -Continue with baricitinib  - CT angiogram chest negative for PE - Continue Supportive care: vitamin C/zinc, albuterol, Tylenol, I-S, flutter valve, proning advised. -Remains with significant hypoxia, but her respiratory status, he is requiring 10 to 12 L high flow nasal cannula, he has some nasal irritation, so he has been alternating between NRB and HFNC . -Encouraged use incentive spirometry, flutter valve, to prone and out of bed to chair. -He is with significant anxiety, much improved with xanax.  - Oxygen - SpO2: 93 % O2 Flow Rate (L/min): 15 L/min FiO2 (%): (!) 15 % HFNC - Continue to follow labs as below  Lab Results  Component Value Date   SARSCOV2NAA POSITIVE (A) 09/26/2020   SARSCOV2NAA NEGATIVE 02/06/2020   [place] NEGATIVE 11/17/2019     Recent Labs  Lab 10/02/20 0057 10/03/20 0112 10/04/20 0030 10/05/20 0149 10/06/20 0509 10/08/20 0120  DDIMER 1.57* 2.36* 1.93* 1.64* 1.71*  --   ALT 95* 67* 58* 58* 45* 45*   Acute CHF with a preserved EF, with cardiomegaly, pulmonary edema, bilateral pleural effusion -Received Lasix 60 mg IV x1 in ED, BNP 23.4, troponin 9 -  2D echo with a preserved EF -Initially on scheduled Lasix, but he does appear to be a euvolemic currently, he is with normal BNP today, far no indication for Lasix.  History of CAD status post stents x2 -Troponins negative, currently no chest pain, not on any home medications for this  Acute on chronic thrombocytopenia -Possibly worsened due to COVID-19, platelets 88K on admission, resolved  Transaminitis -Due to Covid, stable, monitor closely.  GERD Continue PPI I  Mild acute kidney injury  -Resolved  History of hemoptysis complicated by epistaxis in 11/2019 -Follows Dr. Inez Catalina, was found to have clot in LUL bronchus  Constipation - he was encouraged to take his laxatives.  Anxiety -On as needed Xanax  Obesity Estimated body mass index is 32.07 kg/m as calculated from the  following:   Height as of this encounter: 5\' 11"  (1.803 m).   Weight as of this encounter: 104.3 kg.  Code Status: Full CODE STATUS DVT Prophylaxis: Lovenox Family Communication: Discussed with Matthew [name]'s wife Matthew [name]'s wife, Matthew [name] [phone] (she is also covid positive) by phone daily ,most recent 12/18.  Disposition Plan:     Status is: Inpatient  Remains inpatient appropriate because:Inpatient level of care appropriate due to severity of illness   Dispo: The Matthew [name] is from: Home              Anticipated d/c is to: Home              Anticipated d/c date is: > 3 days              Matthew [name] currently is not medically stable to d/c.  Procedures:  2D echo  Consultants:   None  Antimicrobials:   Anti-infectives (From admission, onward)   Start     Dose/Rate Route Frequency Ordered Stop   09/27/20 1000  remdesivir 100 mg in sodium chloride 0.9 % 100 mL IVPB       "Followed by" Linked Group Details   100 mg 200 mL/hr over 30 Minutes Intravenous Daily 09/26/20 2208 09/30/20 1900   09/26/20 2215  remdesivir 200 mg in sodium chloride 0.9% 250 mL IVPB       "Followed by" Linked Group Details   200 mg 580 mL/hr over 30 Minutes Intravenous Once 09/26/20 2208 09/27/20 0146         Medications  Scheduled Meds: . baricitinib  4 mg Oral Daily  . enoxaparin (LOVENOX) injection  55 mg Subcutaneous Daily  . feeding supplement  237 mL Oral BID BM  . melatonin  3 mg Oral QHS  . methylPREDNISolone (SOLU-MEDROL) injection  60 mg Intravenous BID  . pantoprazole  40 mg Oral Daily  . polyethylene glycol  17 g Oral Once  . sodium chloride flush  3 mL Intravenous Q12H   Continuous Infusions:  PRN Meds:.acetaminophen, ALPRAZolam, guaiFENesin-dextromethorphan, ondansetron (ZOFRAN) IV, polyethylene glycol, sodium chloride        Objective:   Vitals:   10/07/20 2030 10/07/20 2351 10/08/20 0328 10/08/20 0734  BP: 103/78 (!) 86/54 (!) 96/52 95/64  Pulse: 82 63 70 66   Resp: 20 18 20 20   Temp: 98.2 F (36.8 C) 97.9 F (36.6 C) 97.6 F (36.4 C) (!) 97.5 F (36.4 C)  TempSrc: Oral  Axillary   SpO2: 91% 92% 91% 93%  Weight:      Height:        Intake/Output Summary (Last 24 hours) at 10/08/2020 1145 Last data filed at 10/08/2020 0900 Gross per 24 hour  Intake  240 ml  Output 850 ml  Net -610 ml     Wt Readings from Last 3 Encounters:  10/07/20 104.3 kg  12/04/19 108.4 kg  11/20/19 107.9 kg    Physical Exam  Awake Alert, Oriented X 3, No new F.N deficits, Normal affect Symmetrical Chest wall movement, Good air movement bilaterally, CTAB RRR,No Gallops,Rubs or new Murmurs, No Parasternal Heave +ve B.Sounds, Abd Soft, No tenderness, No rebound - guarding or rigidity. No Cyanosis, Clubbing or edema, No new Rash or bruise      Data Reviewed:  I have personally reviewed following labs and imaging studies  Micro Results No results found for this or any previous visit (from the past 240 hour(s)).  Radiology Reports CT ANGIO CHEST PE W OR WO CONTRAST  Result Date: 09/27/2020 CLINICAL DATA:  54 year old male with history of COVID-19, suspected pulmonary embolism. EXAM: CT ANGIOGRAPHY CHEST WITH CONTRAST TECHNIQUE: Multidetector CT imaging of the chest was performed using the standard protocol during bolus administration of intravenous contrast. Multiplanar CT image reconstructions and MIPs were obtained to evaluate the vascular anatomy. CONTRAST:  100 mL Omnipaque 350, intravenous COMPARISON:  Chest CT from 11/17/2019 FINDINGS: Cardiovascular: Satisfactory opacification of the pulmonary arteries to the segmental level. No evidence of pulmonary embolism. Normal heart size. No pericardial effusion. The heart is normal in size. No pericardial effusion. Severe coronary atherosclerotic calcifications. Mediastinum/Nodes: Similar appearing prominent right paratracheal lymph node measuring up to 1.2 cm in short axis. Otherwise no enlarged mediastinal,  hilar, or axillary lymph nodes. Thyroid gland, trachea, and esophagus demonstrate no significant findings. Lungs/Pleura: Interval development of near diffuse, peripherally predominant ground-glass pulmonary opacities with a mid lung and basal predominance. Similar appearing moderate upper lobe predominant paraseptal and centrilobular emphysema. No pleural effusion or pneumothorax. Upper Abdomen: The visualized upper abdomen is within normal limits. Musculoskeletal: No chest wall abnormality. No acute or significant osseous findings. Review of the MIP images confirms the above findings. IMPRESSION: Vascular: 1. No evidence of pulmonary embolism. 2. Severe coronary atherosclerotic calcifications. Non-Vascular: 1. Diffuse, mid lung and basilar, peripherally predominant ground-glass opacities - findings compatible with multifocal pneumonia associated with COVID-19. 2. Similar appearing moderate upper lobe predominant centrilobular and paraseptal emphysema. Electronically Signed   By: Marliss Coots MD   On: 09/27/2020 14:31   DG Chest Port 1 View  Result Date: 10/06/2020 CLINICAL DATA:  COVID positive, pleural effusion, pneumothorax EXAM: PORTABLE CHEST 1 VIEW COMPARISON:  09/26/2020 FINDINGS: Slight interval increase in diffuse bilateral heterogeneous and interstitial airspace opacity, most conspicuous at the lung bases. No significant pleural effusion noted. No pneumothorax. The heart and mediastinum are unremarkable. IMPRESSION: 1. Slight interval increase in diffuse bilateral heterogeneous and interstitial airspace opacity, most conspicuous at the lung bases, consistent with worsened COVID airspace disease. 2.  No significant pleural effusion noted. No pneumothorax. Electronically Signed   By: Lauralyn Primes M.D.   On: 10/06/2020 09:37   DG CHEST PORT 1 VIEW  Result Date: 09/26/2020 CLINICAL DATA:  COVID-19 EXAM: PORTABLE CHEST 1 VIEW COMPARISON:  09/26/2020 FINDINGS: Slight worsening of bilateral basilar  predominant opacities. No pleural effusion or pneumothorax. Normal cardiomediastinal contours. IMPRESSION: Slight worsening of bilateral basilar predominant opacities. Electronically Signed   By: Deatra Robinson M.D.   On: 09/26/2020 23:26   DG Chest Portable 1 View  Result Date: 09/26/2020 CLINICAL DATA:  Cough and shortness of breath. EXAM: PORTABLE CHEST 1 VIEW COMPARISON:  12/04/2019 FINDINGS: Cardiomegaly which is increased from prior exam. Pulmonary edema, moderate in degree.  There are bilateral pleural effusions. No confluent consolidation. No pneumothorax. No acute osseous abnormalities are seen. IMPRESSION: CHF with cardiomegaly, pulmonary edema and bilateral pleural effusions. Electronically Signed   By: Keith Rake M.D.   On: 09/26/2020 19:14   ECHOCARDIOGRAM LIMITED  Result Date: 09/28/2020    ECHOCARDIOGRAM LIMITED REPORT   Matthew [name] Name:   Shulem Loletha Grayer Heatley Date of Exam: 09/28/2020 Medical Rec #:  [id number]          Height:       71.0 in Accession #:    [id number]         Weight:       244.3 lb Date of Birth:  1966-09-26          BSA:          2.296 m Matthew [name] Age:    17 years           BP:           120/84 mmHg Matthew [name] Gender: M                  HR:           73 bpm. Exam Location:  Inpatient Procedure: Limited Echo, Limited Color Doppler and Cardiac Doppler Indications:    acute respiratory distress  History:        Matthew [name] has prior history of Echocardiogram examinations, most                 recent 03/30/2016. CAD; Covid.  Sonographer:    Johny Chess Referring Phys: [id number] Candace Gallus MELVIN  Sonographer Comments: Image acquisition challenging due to respiratory motion. IMPRESSIONS  1. Left ventricular ejection fraction, by estimation, is 60 to 65%. The left ventricle has normal function. The left ventricle has no regional wall motion abnormalities. There is mild left ventricular hypertrophy.  2. Right ventricular systolic function is normal. The right ventricular size is normal.   3. The mitral valve is normal in structure. No evidence of mitral valve regurgitation. No evidence of mitral stenosis.  4. The aortic valve is tricuspid.  5. The inferior vena cava is normal in size with greater than 50% respiratory variability, suggesting right atrial pressure of 3 mmHg. FINDINGS  Left Ventricle: Left ventricular ejection fraction, by estimation, is 60 to 65%. The left ventricle has normal function. The left ventricle has no regional wall motion abnormalities. The left ventricular internal cavity size was normal in size. There is  mild left ventricular hypertrophy. Right Ventricle: The right ventricular size is normal. No increase in right ventricular wall thickness. Right ventricular systolic function is normal. Pericardium: There is no evidence of pericardial effusion. Mitral Valve: The mitral valve is normal in structure. No evidence of mitral valve stenosis. Tricuspid Valve: The tricuspid valve is normal in structure. Tricuspid valve regurgitation is not demonstrated. No evidence of tricuspid stenosis. Aortic Valve: The aortic valve is tricuspid. Pulmonic Valve: The pulmonic valve was not well visualized. Pulmonic valve regurgitation is not visualized. No evidence of pulmonic stenosis. Aorta: The aortic root is normal in size and structure. Pulmonary Artery: Indeterminant PASP, inadequate TR jet. Venous: The inferior vena cava is normal in size with greater than 50% respiratory variability, suggesting right atrial pressure of 3 mmHg. IAS/Shunts: No atrial level shunt detected by color flow Doppler. LEFT VENTRICLE PLAX 2D LVIDd:         4.20 cm LVIDs:         2.80 cm LV PW:  1.30 cm LV IVS:        1.10 cm LVOT diam:     2.00 cm LV SV:         71 LV SV Index:   31 LVOT Area:     3.14 cm  IVC IVC diam: 1.70 cm LEFT ATRIUM         Index LA diam:    4.30 cm 1.87 cm/m  AORTIC VALVE LVOT Vmax:   116.00 cm/s LVOT Vmean:  76.100 cm/s LVOT VTI:    0.227 m  AORTA Ao Root diam: 3.10 cm MITRAL  VALVE MV Area (PHT): 2.91 cm    SHUNTS MV Decel Time: 261 msec    Systemic VTI:  0.23 m MV E velocity: 75.40 cm/s  Systemic Diam: 2.00 cm MV A velocity: 66.00 cm/s MV E/A ratio:  1.14 Carlyle Dolly MD Electronically signed by Carlyle Dolly MD Signature Date/Time: 09/28/2020/2:52:33 PM    Final     Lab Data:  CBC: Recent Labs  Lab 10/03/20 0112 10/04/20 0030 10/05/20 0149 10/06/20 0509 10/08/20 0120  WBC 13.6* 12.4* 16.9* 14.0* 14.4*  HGB 15.1 14.8 14.3 14.1 14.0  HCT 41.6 43.6 41.2 39.3 40.0  MCV 87.0 89.0 88.4 88.5 88.7  PLT 244 239 245 238 0000000   Basic Metabolic Panel: Recent Labs  Lab 10/03/20 0112 10/04/20 0030 10/05/20 0149 10/06/20 0509 10/08/20 0120  NA 132* 132* 132* 131* 130*  K 5.0 5.1 4.9 4.7 4.9  CL 102 105 101 100 99  CO2 18* 18* 22 23 21*  GLUCOSE 179* 194* 165* 161* 173*  BUN 28* 30* 26* 19 21*  CREATININE 0.93 1.10 1.06 0.97 0.89  CALCIUM 8.5* 8.2* 8.3* 8.2* 8.3*   GFR: Estimated Creatinine Clearance: 113.9 mL/min (by C-G formula based on SCr of 0.89 mg/dL). Liver Function Tests: Recent Labs  Lab 10/03/20 0112 10/04/20 0030 10/05/20 0149 10/06/20 0509 10/08/20 0120  AST 18 18 22 20 21   ALT 67* 58* 58* 45* 45*  ALKPHOS 90 84 80 79 82  BILITOT 1.4* 1.4* 1.4* 0.9 0.7  PROT 6.5 6.3* 6.2* 6.1* 6.1*  ALBUMIN 2.9* 2.8* 2.7* 2.5* 2.5*   No results for input(s): LIPASE, AMYLASE in the last 168 hours. No results for input(s): AMMONIA in the last 168 hours. Coagulation Profile: No results for input(s): INR, PROTIME in the last 168 hours. Cardiac Enzymes: No results for input(s): CKTOTAL, CKMB, CKMBINDEX, TROPONINI in the last 168 hours. BNP (last 3 results) No results for input(s): PROBNP in the last 8760 hours. HbA1C: No results for input(s): HGBA1C in the last 72 hours. CBG: No results for input(s): GLUCAP in the last 168 hours. Lipid Profile: No results for input(s): CHOL, HDL, LDLCALC, TRIG, CHOLHDL, LDLDIRECT in the last 72  hours. Thyroid Function Tests: No results for input(s): TSH, T4TOTAL, FREET4, T3FREE, THYROIDAB in the last 72 hours. Anemia Panel: No results for input(s): VITAMINB12, FOLATE, FERRITIN, TIBC, IRON, RETICCTPCT in the last 72 hours. Urine analysis: No results found for: COLORURINE, APPEARANCEUR, LABSPEC, PHURINE, GLUCOSEU, HGBUR, BILIRUBINUR, KETONESUR, PROTEINUR, UROBILINOGEN, NITRITE, Louie Boston Messina Kosinski M.D. [facility] 10/08/2020, 11:45 AM   Call night coverage person covering after 7pm

## 2020-10-08 NOTE — Progress Notes (Signed)
Pt reported coughing up blood. Pt actively coughing up frank blood into waste bin when seen in room. Estimated amount 10-15 ml. Pt placed on NRB @15L  on top of 8L HFNC for saturations in low 80s and pt c/o 'not being able to breathe'. MD paged and reported that he will look into situation. HFNC increased to 12L.with O2 sat at 90. After 40 minutes coughing and seemed to have subsided. Now only occasionally coughing up blood tinged sputum. Will continue to monitor.   Addendum: pulmonary to be consulted.

## 2020-10-08 NOTE — Consult Note (Addendum)
NAME:  Matthew [name], MRN:  [id number], DOB:  1966-05-08, LOS: 12 ADMISSION DATE:  09/26/2020, CONSULTATION DATE:  10/08/2020 REFERRING MD:  Dr. Randol [name], CHIEF COMPLAINT:  Hemoptysis.    Brief History:  54 year old admitted with COVID, had hemoptysis on day 12 of admission prompting PCCM consult.   History of Present Illness:  54 year old male with PMH as below, which is significant for CAD s/p PCI, former smoker, and admission in February for hemoptysis. Bronchoscopy done at that time showed LUL clot. Continued bleeding was treated with nebulized TXA and hemoptysis resolved. He is now admitted to [facility] for COVID 19 pneumonia after SOB and GI syndrome began on 12/9. He presented to [facility] ED 12/16 after shortness of breath worsened. He tested positive for COVID-19 with O2 sats in the 70s and was admitted to the hospitalist service. He was treated with remdesivir, baricitinib and steroids. Continued to have high O2 demands on 12L HFNC. CTA negative for PE. On 12/28 he developed hemoptysis and PCCM was consulted.   Past Medical History:   has a past medical history of Coronary artery disease (per pt last cardiology visit 2011 (found documentation 12-05-2009 w/ dr Matthew [name] with [facility] in epic)/  currently pt is followed by pcp), GERD (gastroesophageal reflux disease), Hyperlipidemia, Olecranon bursitis of left elbow, Respiratory failure (HCC), S/P right coronary artery (RCA) stent placement (12/28/2008), and Wears dentures.   Significant Hospital Events:    Consults:    Procedures:    Significant Diagnostic Tests:  CTA chest 12/17 > No evidence of PE, severe coronary atherosclerotic calcifications. Diffuse, mid lung and basilar, peripherally predominant ground-glass opacities - findings compatible with multifocal pneumonia associated with COVID-19.  Micro Data:  12/16 COVID-19 positive  Antimicrobials:    Interim History / Subjective:    Objective   Blood pressure  97/76, pulse (!) 101, temperature 97.7 F (36.5 C), temperature source Oral, resp. rate 20, height 5\' 11"  (1.803 m), weight 104.3 kg, SpO2 93 %.        Intake/Output Summary (Last 24 hours) at 10/08/2020 2314 Last data filed at 10/08/2020 2008 Gross per 24 hour  Intake 474 ml  Output 950 ml  Net -476 ml   Filed Weights   10/05/20 0424 10/06/20 0500 10/07/20 0500  Weight: 108 kg 108.2 kg 104.3 kg    Examination: General: middle aged male in mild respiratory distress.  HENT: [place]/AT, PERRL, no JVD Lungs: bibasilar crackles. Very mildly distressed with prolonged speaking.  Cardiovascular: RRR, no MRG Abdomen: Soft, non-tender, nondistedned Extremities: No acute deformity. 1+ pretibial edema.  Neuro: Alert, oriented, non-focal  Resolved Hospital Problem list     Assessment & Plan:   Hemoptysis: with history of same in February of this year. FOB at that time found clot in LUL and hemoptysis treated with nebulized TXA. Now had one episode of hemoptysis, seems to have been self limited.  - Monitor closely in PCU setting - Cough suppression, Tussionex ordered.  - Should hemoptysis persist may need ICU transfer and intubation.  - consider PE workup if no impove  Acute hypoxemic respiratory failure secondary to COVID 19 pneumonia.  - Continued management per primary team including steroids, baricitinib. S/p Remdesivir.  - Supplemental O2 as needed to keep SpO2 > 85% at rest.    Best practice (evaluated daily)  Diet: Per primary Pain/Anxiety/Delirium protocol (if indicated): NA VAP protocol (if indicated): NA DVT prophylaxis: Per primary GI prophylaxis: Per primary Glucose control: Per primary Mobility: Per  primary Disposition:Tele  Goals of Care:  Last date of multidisciplinary goals of care discussion:12/28 Family and staff present: NP, MD, patient.  Summary of discussion: FULL code Follow up goals of care discussion due:  Code Status: FULL  Labs   CBC: Recent Labs   Lab 10/03/20 0112 10/04/20 0030 10/05/20 0149 10/06/20 0509 10/08/20 0120  WBC 13.6* 12.4* 16.9* 14.0* 14.4*  HGB 15.1 14.8 14.3 14.1 14.0  HCT 41.6 43.6 41.2 39.3 40.0  MCV 87.0 89.0 88.4 88.5 88.7  PLT 244 239 245 238 0000000    Basic Metabolic Panel: Recent Labs  Lab 10/03/20 0112 10/04/20 0030 10/05/20 0149 10/06/20 0509 10/08/20 0120  NA 132* 132* 132* 131* 130*  K 5.0 5.1 4.9 4.7 4.9  CL 102 105 101 100 99  CO2 18* 18* 22 23 21*  GLUCOSE 179* 194* 165* 161* 173*  BUN 28* 30* 26* 19 21*  CREATININE 0.93 1.10 1.06 0.97 0.89  CALCIUM 8.5* 8.2* 8.3* 8.2* 8.3*   GFR: Estimated Creatinine Clearance: 113.9 mL/min (by C-G formula based on SCr of 0.89 mg/dL). Recent Labs  Lab 10/04/20 0030 10/05/20 0149 10/06/20 0509 10/08/20 0120  WBC 12.4* 16.9* 14.0* 14.4*    Liver Function Tests: Recent Labs  Lab 10/03/20 0112 10/04/20 0030 10/05/20 0149 10/06/20 0509 10/08/20 0120  AST 18 18 22 20 21   ALT 67* 58* 58* 45* 45*  ALKPHOS 90 84 80 79 82  BILITOT 1.4* 1.4* 1.4* 0.9 0.7  PROT 6.5 6.3* 6.2* 6.1* 6.1*  ALBUMIN 2.9* 2.8* 2.7* 2.5* 2.5*   No results for input(s): LIPASE, AMYLASE in the last 168 hours. No results for input(s): AMMONIA in the last 168 hours.  ABG    Component Value Date/Time   PHART 7.464 (H) 09/26/2020 2035   PCO2ART 31.7 (L) 09/26/2020 2035   PO2ART 90 09/26/2020 2035   HCO3 22.8 09/26/2020 2035   TCO2 24 09/26/2020 2035   ACIDBASEDEF 0.8 11/17/2019 1618   O2SAT 98.0 09/26/2020 2035     Coagulation Profile: No results for input(s): INR, PROTIME in the last 168 hours.  Cardiac Enzymes: No results for input(s): CKTOTAL, CKMB, CKMBINDEX, TROPONINI in the last 168 hours.  HbA1C: No results found for: HGBA1C  CBG: No results for input(s): GLUCAP in the last 168 hours.  Review of Systems:   Bolds are positive  Constitutional: weight loss, gain, night sweats, Fevers, chills, fatigue .  HEENT: headaches, Sore throat, sneezing, nasal  congestion, post nasal drip, Difficulty swallowing, Tooth/dental problems, visual complaints visual changes, ear ache CV:  chest pain, radiates:,Orthopnea, PND, swelling in lower extremities, dizziness, palpitations, syncope.  GI  heartburn, indigestion, abdominal pain, nausea, vomiting, diarrhea, change in bowel habits, loss of appetite, bloody stools.  Resp: cough, productive:, hemoptysis, dyspnea, chest pain, pleuritic.  Skin: rash or itching or icterus GU: dysuria, change in color of urine, urgency or frequency. flank pain, hematuria  MS: joint pain or swelling. decreased range of motion  Psych: change in mood or affect. depression or anxiety.  Neuro: difficulty with speech, weakness, numbness, ataxia    Past Medical History:  He,  has a past medical history of Coronary artery disease (per pt last cardiology visit 2011 (found documentation 12-05-2009 w/ dr Leonia Reeves with [facility] in epic)/  currently pt is followed by pcp), GERD (gastroesophageal reflux disease), Hyperlipidemia, Olecranon bursitis of left elbow, Respiratory failure ([place]), S/P right coronary artery (RCA) stent placement (12/28/2008), and Wears dentures.   Surgical History:   Past Surgical History:  Procedure Laterality Date  . CARDIAC CATHETERIZATION  12-09-2009  dr Ilda Foil   abnormal stress test (12-05-2009) widely patent coronaries present w/ evidence of diffuse atherosclerotic disease/  widely patent stented segments in the proximal and mid segment RCA/  LVEF 65%  . CORONARY ANGIOPLASTY WITH STENT PLACEMENT  12-28-2008  dr berry   PCI and stenting of the proxmial and mid RCA using cutting balloon atherectomy (x2 DES, Xience)/  LVEF >60% w/ normal wall motion  . ESOPHAGOGASTRODUODENOSCOPY N/A 11/17/2019   Procedure: ESOPHAGOGASTRODUODENOSCOPY (EGD);  Surgeon: Carol Ada, MD;  Location: [facility];  Service: Gastroenterology;  Laterality: N/A;  . KNEE ARTHROSCOPY W/ MENISCECTOMY Right 2002  . NEUROPLASTY / TRANSPOSITION  ULNAR NERVE AT ELBOW Right 10/2014   dr Caralyn Guile  . OLECRANON BURSECTOMY Left 05/06/2017   Procedure: LEFT ELBOW OLECRANON BURSA EXCISION;  Surgeon: Iran Planas, MD;  [facility];  Service: Orthopedics;  Laterality: Left;  . ORIF RIGHT ANKLE FX  2006  . TONSILLECTOMY  age 4  . TRANSTHORACIC ECHOCARDIOGRAM  03/30/2016   mild concentric LVH, ef 50-55%/  trivial TR     Social History:   reports that he quit smoking about 10 months ago. His smoking use included cigarettes. He has a 78.00 pack-year smoking history. He has never used smokeless tobacco. He reports current alcohol use. He reports that he does not use drugs.   Family History:  His family history includes Cancer in his father and mother.   Allergies Allergies  Allergen Reactions  . Bee Venom   . Codeine Nausea And Vomiting  . Onion Swelling    "thoart swells up"     Home Medications  Prior to Admission medications   Medication Sig Start Date End Date Taking? Authorizing Provider  ondansetron (ZOFRAN ODT) 4 MG disintegrating tablet Take 1 tablet (4 mg total) by mouth every 8 (eight) hours as needed for nausea or vomiting. 09/23/20  Yes Hall-Potvin, [place], PA-C  cetirizine (ZYRTEC ALLERGY) 10 MG tablet Take 1 tablet (10 mg total) by mouth daily. Patient not taking: Reported on 09/27/2020 08/24/20   Hall-Potvin, [place], PA-C  fluticasone (FLONASE) 50 MCG/ACT nasal spray Place 1 spray into both nostrils daily. Patient not taking: Reported on 09/27/2020 08/24/20   Hall-Potvin, [place], PA-C  predniSONE (DELTASONE) 20 MG tablet Take 1 tablet (20 mg total) by mouth daily. Patient not taking: Reported on 09/27/2020 08/24/20   Hall-Potvin, [place], PA-C      Georgann Housekeeper, AGACNP-BC [place] for personal pager PCCM on call pager [phone]  10/08/2020 11:59 PM

## 2020-10-08 NOTE — Progress Notes (Addendum)
Patient with significant amount of hemoptysis, with increased oxygen requirement, currently on 12L HFNC +NRB, from 11L HFNC, history of admission on feb 2021 for the same, required intubation, bronch and tranexamic acid inhalation, PCCM consulted, will hold his lovenox. Huey Bienenstock MD

## 2020-10-08 NOTE — Progress Notes (Signed)
Patient's spouse Marikay Alar called and left voicemail on my phone stating she needed to speak with someone regarding patient's uninsured status of this inpatient.  Advised I would forward information to appropriate person whom would be able to further assist(Financial Counselor for [facility]) whom would screen patient for programs. She states she can be reached at [phone] to answer any questions.

## 2020-10-08 NOTE — Progress Notes (Signed)
Occupational Therapy Treatment Patient Details Name: Matthew [name] MRN: [id number] DOB: Mar 06, 1966 Today's Date: 10/08/2020    History of present illness Pt is a 54 y.o. male admitted 09/26/20 with SOB and GI distress (had declined COVID test when seen in urgent care). Pt now testing (+) COVID-19. Workup for acute hypoxic respiratory failure due to acute COVID-19 viral PNA. PMH includes CAD, CHF, tobacco use (quit 11/2019); pt unvaccinated.   OT comments  Pt progressing towards established OT goals. Upon arrival, pt sitting in recliner with SpO2 97% on 8L via HFNC. Pt agreeable to mobility to/from bathroom. Pt performing mobility and functional transfers with Supervision-Min Guard A and RW. SpO2 90-80s on 8L during mobility to toilet. Once seated, RR elevating and SpO2 dropping to 77%; pt requiring 10L and then 15L of O2. Requiring ~10 minutes to recover back to 90%. Providing Max cues for purse lip breathing. Once recovered and SpO2 back to high 90s on 10L, discussed need for slowing RR, keeping calm, and breathing through nose. In return to recliner from bathroom, pt again dropping to 76% on 10L O2; requiring 15L and ~5 minutes to recover to 90%. Pt demonstrating improved control of breahting rate and anxiety; pt encouraged by improvement. Contineu to recommend dc to home once medically stable per physician. Will continue to follow acutely as admitted.    Follow Up Recommendations  No OT follow up;Supervision - Intermittent    Equipment Recommendations  Tub/shower seat    Recommendations for Other Services      Precautions / Restrictions Precautions Precautions: Fall;Other (comment) Precaution Comments: becomes anxious with increased O2 demands       Mobility Bed Mobility               General bed mobility comments: Received sitting in recliner  Transfers Overall transfer level: Needs assistance Equipment used: Rolling walker (2 wheeled) Transfers: Sit to/from  Stand Sit to Stand: Supervision         General transfer comment: Supervision for safety    Balance Overall balance assessment: Needs assistance   Sitting balance-Leahy Scale: Good       Standing balance-Leahy Scale: Fair Standing balance comment: Utilized RW but able to static stand without support                           ADL either performed or assessed with clinical judgement   ADL Overall ADL's : Needs assistance/impaired                         Toilet Transfer: Ambulation;Regular Toilet;Grab Camera operator Details (indicate cue type and reason): Supervision for safety         Functional mobility during ADLs: Min guard;Rolling walker;Cueing for safety General ADL Comments: Pt performing functional mobility to/from bathroom. Supervision-Min Guard A for functional mobility. Pt requiring significant time for recovering SpO2.     Vision       Perception     Praxis      Cognition Arousal/Alertness: Awake/alert Behavior During Therapy: Anxious Overall Cognitive Status: Within Functional Limits for tasks assessed                                          Exercises Exercises: Other exercises Other Exercises Other Exercises: pursed lip breathing Other Exercises: Reviewed theraband exercises   Shoulder  Instructions       General Comments SpO2 97% on 8L at rest. SpO2 90-80s on 8L during mobility to toilet. Once seated, RR elevating and SpO2 77%; pt requiring 10L and then 15L of O2. Requiring ~10 minutes to recover back to 90%. In return to recliner from bathroom, pt again dropping to 76% on 10L O2; requiring 15L and ~5 minutes to recover to 90%. HR 110-90. BP 106/63 (78)    Pertinent Vitals/ Pain       Pain Assessment: No/denies pain  Home Living                                          Prior Functioning/Environment              Frequency  Min 3X/week         Progress Toward Goals  OT Goals(current goals can now be found in the care plan section)  Progress towards OT goals: Progressing toward goals  Acute Rehab OT Goals Patient Stated Goal: to walk out of here OT Goal Formulation: With patient Time For Goal Achievement: 10/16/20 Potential to Achieve Goals: Good ADL Goals Pt Will Perform Lower Body Bathing: with modified independence;sit to/from stand Pt Will Perform Lower Body Dressing: with modified independence;sit to/from stand Pt Will Transfer to Toilet: with modified independence;ambulating Pt/caregiver will Perform Home Exercise Program: Increased strength;Both right and left upper extremity;With written HEP provided;Independently;With theraband Additional ADL Goal #1: Pt will independently verbalize 3 energy conservation strategies Additional ADL Goal #2: Pt will maintain SpO2 greater than 88 during functional activity Additional ADL Goal #3: Pt will demonstrate use of 2 anxiety managment strategies with min vc to help with mobility and ADL tasks  Plan Discharge plan remains appropriate    Co-evaluation                 AM-PAC OT "6 Clicks" Daily Activity     Outcome Measure   Help from another person eating meals?: None Help from another person taking care of personal grooming?: A Little Help from another person toileting, which includes using toliet, bedpan, or urinal?: A Little Help from another person bathing (including washing, rinsing, drying)?: A Little Help from another person to put on and taking off regular upper body clothing?: A Little Help from another person to put on and taking off regular lower body clothing?: A Little 6 Click Score: 19    End of Session Equipment Utilized During Treatment: Oxygen;Rolling walker (8-15L)  OT Visit Diagnosis: Unsteadiness on feet (R26.81);Other abnormalities of gait and mobility (R26.89);Muscle weakness (generalized) (M62.81);Dizziness and giddiness (R42)   Activity  Tolerance Patient tolerated treatment well   Patient Left in chair;with call bell/phone within reach   Nurse Communication Mobility status;Other (comment) (O2 needs)        Time: 1607-3710 OT Time Calculation (min): 61 min  Charges: OT General Charges $OT Visit: 1 Visit OT Treatments $Self Care/Home Management : 38-52 mins $Therapeutic Activity: 8-22 mins  Matthew [name] MSOT, OTR/L Acute Rehab Pager: [phone] Office: [phone]   Matthew [name] 10/08/2020, 4:25 PM

## 2020-10-08 NOTE — TOC Progression Note (Signed)
Transition of Care [facility]) - Progression Note    Patient Details  Name: Matthew [name] MRN: [id number] Date of Birth: January 10, 1966  [facility] CM/SW Contact  Lawerance Sabal, RN Phone Number: 10/08/2020, 2:55 PM  Clinical Narrative:   [name] w patient over the phone. He states that he lives at home with his wife. He states that she is well, she received MAB, would be able to assist him as needed at home, and provide transportation home when [name]. Patient is uninsured and w/o PCP. We discussed f/u at [facility] clinic, appointment made 10/21/20 at 2:30 pm and added to AVS. TOC following for home oxygen. Patient FiO2 w activity too high at this time to DC.  Patient will be charity home oxygen.      Expected Discharge Plan: Home/Self Care Barriers to Discharge: Continued Medical Work up  Expected Discharge Plan and Services Expected Discharge Plan: Home/Self Care   Discharge Planning Services: CM Consult Post Acute Care Choice: Durable Medical Equipment Living arrangements for the past 2 months: Single Family Home                                       Social Determinants of Health (SDOH) Interventions    Readmission Risk Interventions No flowsheet data found.

## 2020-10-09 LAB — COMPREHENSIVE METABOLIC PANEL
ALT: 51 U/L — ABNORMAL HIGH (ref 0–44)
AST: 23 U/L (ref 15–41)
Albumin: 2.5 g/dL — ABNORMAL LOW (ref 3.5–5.0)
Alkaline Phosphatase: 84 U/L (ref 38–126)
Anion gap: 8 (ref 5–15)
BUN: 23 mg/dL — ABNORMAL HIGH (ref 6–20)
CO2: 25 mmol/L (ref 22–32)
Calcium: 8.5 mg/dL — ABNORMAL LOW (ref 8.9–10.3)
Chloride: 99 mmol/L (ref 98–111)
Creatinine, Ser: 1.05 mg/dL (ref 0.61–1.24)
GFR, Estimated: >60 mL/min (ref >60)
Glucose, Bld: 166 mg/dL — ABNORMAL HIGH (ref 70–99)
Potassium: 4.5 mmol/L (ref 3.5–5.1)
Sodium: 132 mmol/L — ABNORMAL LOW (ref 135–145)
Total Bilirubin: 0.9 mg/dL (ref 0.3–1.2)
Total Protein: 6.3 g/dL — ABNORMAL LOW (ref 6.5–8.1)

## 2020-10-09 LAB — CBC
HCT: 41.2 % (ref 39.0–52.0)
Hemoglobin: 13.9 g/dL (ref 13.0–17.0)
MCH: 30.5 pg (ref 26.0–34.0)
MCHC: 33.7 g/dL (ref 30.0–36.0)
MCV: 90.5 fL (ref 80.0–100.0)
Platelets: 260 10*3/uL (ref 150–400)
RBC: 4.55 MIL/uL (ref 4.22–5.81)
RDW: 13.1 % (ref 11.5–15.5)
WBC: 18.5 10*3/uL — ABNORMAL HIGH (ref 4.0–10.5)
nRBC: 0 % (ref 0.0–0.2)

## 2020-10-09 MED ORDER — MENTHOL 3 MG MT LOZG
1.0000 | LOZENGE | OROMUCOSAL | Status: DC | PRN
  Filled 2020-10-09: qty 9

## 2020-10-09 MED ORDER — BENZONATATE 100 MG PO CAPS
200.0000 mg | ORAL_CAPSULE | Freq: Three times a day (TID) | ORAL | Status: DC
  Administered 2020-10-09 – 2020-11-07 (×87): 200 mg via ORAL
  Filled 2020-10-09 (×88): qty 2

## 2020-10-09 MED ORDER — ALBUTEROL SULFATE HFA 108 (90 BASE) MCG/ACT IN AERS
2.0000 | INHALATION_SPRAY | RESPIRATORY_TRACT | Status: DC | PRN
  Administered 2020-10-11 – 2020-10-28 (×6): 2 via RESPIRATORY_TRACT
  Filled 2020-10-09: qty 6.7

## 2020-10-09 MED ORDER — METHYLPREDNISOLONE SODIUM SUCC 40 MG IJ SOLR
40.0000 mg | Freq: Two times a day (BID) | INTRAMUSCULAR | Status: DC
  Administered 2020-10-09 – 2020-10-10 (×2): 40 mg via INTRAVENOUS
  Filled 2020-10-09 (×2): qty 1

## 2020-10-09 MED ORDER — SENNOSIDES-DOCUSATE SODIUM 8.6-50 MG PO TABS
2.0000 | ORAL_TABLET | Freq: Every evening | ORAL | Status: DC | PRN
  Filled 2020-10-09: qty 2

## 2020-10-09 MED ORDER — BISACODYL 10 MG RE SUPP: 10.0000 mg | Freq: Every day | RECTAL | Status: DC | PRN

## 2020-10-09 MED ORDER — POLYETHYLENE GLYCOL 3350 17 G PO PACK
17.0000 g | PACK | Freq: Every day | ORAL | Status: DC
  Administered 2020-11-04: 17 g via ORAL
  Filled 2020-10-09 (×12): qty 1

## 2020-10-09 NOTE — Progress Notes (Signed)
Physical Therapy Treatment Patient Details Name: Matthew [name] MRN: [id number] DOB: 1966-04-15 Today's Date: 10/09/2020    History of Present Illness Pt is a 54 y.o. male admitted 09/26/20 with SOB and GI distress (had declined COVID test when seen in urgent care). Pt now testing (+) COVID-19. Workup for acute hypoxic respiratory failure due to acute COVID-19 viral PNA. PMH includes CAD, CHF, tobacco use (quit 11/2019); pt unvaccinated.    PT Comments    Pt demonstrating gradual improvements.  He was able to ambulate 15'x2 on 10-15 L HFNC with 8 L HFNC at rest (see below for specifics).  Pt does continue to be limited by anxiousness and requires increased time for education, recovery, and encouragement.  He is motivated and is sitting most of the day and performing IS/flutter on his own.  Continue to progress as able.    Follow Up Recommendations  Home health PT     Equipment Recommendations  None recommended by PT    Recommendations for Other Services       Precautions / Restrictions Precautions Precautions: Fall;Other (comment) Precaution Comments: becomes anxious with increased O2 demands    Mobility  Bed Mobility               General bed mobility comments: Received sitting in recliner  Transfers Overall transfer level: Needs assistance Equipment used: Rolling walker (2 wheeled) Transfers: Sit to/from Stand Sit to Stand: Supervision         General transfer comment: Supervision for safety and lines/leads; performed x 3  Ambulation/Gait Ambulation/Gait assistance: Min guard Gait Distance (Feet): 15 Feet (15'x2) Assistive device: Rolling walker (2 wheeled) Gait Pattern/deviations: Step-through pattern Gait velocity: Decreased   General Gait Details: Ambulated to door , took seated rest break 10 mins, ambulated back to bed.  Cues for slow movements and controlled breathing.  O2 levels adjusted as needed to maintain saturation and at pt request due to  anxiety.  See vitals/levels in general comments   Stairs             Wheelchair Mobility    Modified Rankin (Stroke Patients Only)       Balance Overall balance assessment: Needs assistance   Sitting balance-Leahy Scale: Good       Standing balance-Leahy Scale: Fair Standing balance comment: Utilized RW but able to static stand without support                            Cognition Arousal/Alertness: Awake/alert Behavior During Therapy: Anxious Overall Cognitive Status: Within Functional Limits for tasks assessed                                 General Comments: Pt reports easy anxiousness.  Example: His O2 sensor connector got caught on gown pocket when trying to stand and he was unable to stand upright - very anxious, return to sitting 10 mins to recover      Exercises      General Comments General comments (skin integrity, edema, etc.): Pt on 8 L HFNC at rest with sats 94%.  PLaced initially on 10 L HFNC for ambultion but sats down to 81% and took 1.5 mins to recover - recovery at 15 L but pt requested more and on tank next option was 25 L, after 2 mins decreased back to 15 L with sats 95%.  Second bout of ambulation at  15 L HFNC with sats 86% and recovery in 1.5 mins on 15 L.  Able to gradually decrease back to 8 L at rest with sats 94%.  Pt requiring at least 10 mins to rest and prep after each transfers due to DOE and anxiety.  Increased time spent to allow pt to talk through anxieties and worries.  Also, encouraged and educated pt on gradual progress. Discussed decreased O2 needs today and was able to ambulate.  Discussed recovery can be slow with COVID and encouraged gradual progress.       Pertinent Vitals/Pain Pain Assessment: No/denies pain    Home Living                      Prior Function            PT Goals (current goals can now be found in the care plan section) Acute Rehab PT Goals Patient Stated Goal: to walk  out of here PT Goal Formulation: With patient Time For Goal Achievement: 10/15/20 Potential to Achieve Goals: Good Progress towards PT goals: Progressing toward goals    Frequency    Min 3X/week      PT Plan Current plan remains appropriate    Co-evaluation              AM-PAC PT "6 Clicks" Mobility   Outcome Measure  Help needed turning from your back to your side while in a flat bed without using bedrails?: None Help needed moving from lying on your back to sitting on the side of a flat bed without using bedrails?: None Help needed moving to and from a bed to a chair (including a wheelchair)?: A Little Help needed standing up from a chair using your arms (e.g., wheelchair or bedside chair)?: A Little Help needed to walk in hospital room?: A Little Help needed climbing 3-5 steps with a railing? : A Little 6 Click Score: 20    End of Session Equipment Utilized During Treatment: Oxygen Activity Tolerance: Patient tolerated treatment well Patient left: with call bell/phone within reach;in chair Nurse Communication: Mobility status PT Visit Diagnosis: Other abnormalities of gait and mobility (R26.89)     Time: 8548-8301 PT Time Calculation (min) (ACUTE ONLY): 58 min  Charges:  $Gait Training: 23-37 mins $Therapeutic Activity: 23-37 mins                     Anise Salvo, PT Acute Rehab Services Pager [phone] [facility] [phone]     Rayetta Humphrey 10/09/2020, 4:55 PM

## 2020-10-09 NOTE — Progress Notes (Signed)
Please reach out to PCCM if hemoptysis recurs.  Thank you.  Myrla Halsted MD PCCM

## 2020-10-09 NOTE — Progress Notes (Signed)
PROGRESS NOTE                                                                                                                                                                                                             Patient Demographics:    Matthew [name], is a 54 y.o. male, DOB - 1966/03/16, MPN:[id number]  Outpatient Primary MD for the patient is Patient, No Pcp Per   Admit date - 09/26/2020   LOS - 13  No chief complaint on file.      Brief Narrative: Patient is a 54 y.o. male with PMHx of  CAD s/p PCI, tobacco use, HLD, GERD-who presented with shortness of breath-found to have acute hypoxic respiratory failure due to COVID-19 pneumonia.  COVID-19 vaccinated status: Unvaccinated  Significant Events: 12/16>> Admit to [facility] for hypoxia due to COVID-19 pneumonia 12/28>> hemoptysis-PCCM consulted-supportive care recommended.  Significant studies: 12/17>> CTA chest: No PE, diffuse groundglass opacities 12/18>> Echo: EF 60-65% 12/26>> chest x-ray: Diffuse bilateral heterogeneous/interstitial airspace opacities   COVID-19 medications: Steroids: 12/16>> Remdesivir: 12/16>> 12/20 Baricitinib: 12/17>>  Antibiotics: None  Microbiology data: None  Procedures: None  Consults: PCCM  DVT prophylaxis: Lovenox held on 12/28 due to hemoptysis-resume on 12/13 if no further episodes of hemoptysis    Subjective:    Matthew [name] today continues to have some coughing spells-with movement requires around 15 L of oxygen-when at rest-able to be titrated down to 8-10 L.  No further hemoptysis overnight.   Assessment  & Plan :   Acute Hypoxic Resp Failure due to Covid 19 Viral pneumonia: Continues to have severe hypoxemia-around 15 L of HFNC with ambulation-at rest-anywhere from 8-10 L.  Continue steroids and baricitinib.  Has completed a course of Remdesivir.  No evidence of volume overload-does not require  diuretics-but will aim to keep in negative balance over the next few days.  Fever: afebrile O2 requirements:  SpO2: 93 % O2 Flow Rate (L/min): 8 L/min FiO2 (%): (!) 15 %   COVID-19 Labs: No results for input(s): DDIMER, FERRITIN, LDH, CRP in the last 72 hours.     Component Value Date/Time   BNP 90.5 10/05/2020 0150    No results for input(s): PROCALCITON in the last 168 hours.  Lab Results  Component Value Date   SARSCOV2NAA POSITIVE (A) 09/26/2020   SARSCOV2NAA NEGATIVE 02/06/2020  [place] NEGATIVE 11/17/2019    Prone/Incentive Spirometry: encouraged  incentive spirometry use 3-4/hour.  Acute on chronic diastolic heart failure: Euvolemic-does not require diuretics-assess volume status closely.  Hemoptysis: Occurred on 12/28-likely related to underlying parenchymal injury from COVID-19 pneumonia.  Continue antitussives-he has a known allergy (reports anaphylaxis) to codeine-and does not want to try Tussionex.  Continue supportive care-if persistent/reoccurs-we will need PCCM reevaluation.  Transaminitis: Due to COVID-19-downtrending-follow periodically  CAD s/p PCI: No anginal symptoms-not on any antiplatelets at home.  Per patient report-PCI was approximately 11 years back  GERD: Continue PPI  Anxiety: Continue as needed Xanax  Constipation-continue MiraLAX  Obesity: Estimated body mass index is 32.25 kg/m as calculated from the following:   Height as of this encounter: 5\' 11"  (1.803 m).   Weight as of this encounter: 104.9 kg.    GI prophylaxis: PPI  ABG:    Component Value Date/Time   PHART 7.464 (H) 09/26/2020 2035   PCO2ART 31.7 (L) 09/26/2020 2035   PO2ART 90 09/26/2020 2035   HCO3 22.8 09/26/2020 2035   TCO2 24 09/26/2020 2035   ACIDBASEDEF 0.8 11/17/2019 1618   O2SAT 98.0 09/26/2020 2035    Vent Settings: N/A    Condition - Extremely Guarded  Family Communication  :  Sherlyn Lees Valdez [id number] 12/29  Code Status :  Full  Code  Diet :  Diet Order            Diet Heart Room service appropriate? No; Fluid consistency: Thin; Fluid restriction: 2000 mL Fluid  Diet effective now                  Disposition Plan  :   Status is: Inpatient  Remains inpatient appropriate because:Inpatient level of care appropriate due to severity of illness   Dispo: The patient is from: Home              Anticipated d/c is to: Home              Anticipated d/c date is: > 3 days              Patient currently is not medically stable to d/c.   Barriers to discharge: Hypoxia requiring O2 supplementation/complete 5 days of IV Remdesivir  Antimicorbials  :    Anti-infectives (From admission, onward)   Start     Dose/Rate Route Frequency Ordered Stop   09/27/20 1000  remdesivir 100 mg in sodium chloride 0.9 % 100 mL IVPB       "Followed by" Linked Group Details   100 mg 200 mL/hr over 30 Minutes Intravenous Daily 09/26/20 2208 09/30/20 1900   09/26/20 2215  remdesivir 200 mg in sodium chloride 0.9% 250 mL IVPB       "Followed by" Linked Group Details   200 mg 580 mL/hr over 30 Minutes Intravenous Once 09/26/20 2208 09/27/20 0146      Inpatient Medications  Scheduled Meds: . baricitinib  4 mg Oral Daily  . benzonatate  200 mg Oral TID  . feeding supplement  237 mL Oral BID BM  . melatonin  3 mg Oral QHS  . methylPREDNISolone (SOLU-MEDROL) injection  60 mg Intravenous BID  . pantoprazole  40 mg Oral Daily  . polyethylene glycol  17 g Oral Once  . sodium chloride flush  3 mL Intravenous Q12H   Continuous Infusions: PRN Meds:.acetaminophen, albuterol, ALPRAZolam, chlorpheniramine-HYDROcodone, ondansetron (ZOFRAN) IV, polyethylene glycol, sodium chloride   Time Spent in minutes  35   See all  Orders from today for further details   Oren Binet M.D on 10/09/2020 at 1:23 PM  To page go to [email or web address] - use universal password  [facility] -  Office  [phone]    Objective:   Vitals:    10/09/20 0423 10/09/20 0700 10/09/20 1152 10/09/20 1211  BP: 92/64 94/61 (!) 89/71 102/72  Pulse: 71 78 90   Resp: 20 20 20    Temp: 98.3 F (36.8 C) 99.8 F (37.7 C) 98.2 F (36.8 C)   TempSrc: Axillary Axillary Oral   SpO2: 100% 93% 95% 93%  Weight: 104.9 kg     Height:        Wt Readings from Last 3 Encounters:  10/09/20 104.9 kg  12/04/19 108.4 kg  11/20/19 107.9 kg     Intake/Output Summary (Last 24 hours) at 10/09/2020 1323 Last data filed at 10/09/2020 1130 Gross per 24 hour  Intake 772 ml  Output 300 ml  Net 472 ml     Physical Exam Gen Exam:Alert awake-not in any distress HEENT:atraumatic, normocephalic Chest: B/L clear to auscultation anteriorly CVS:S1S2 regular Abdomen:soft non tender, non distended Extremities:no edema Neurology: Non focal Skin: no rash   Data Review:    CBC Recent Labs  Lab 10/04/20 0030 10/05/20 0149 10/06/20 0509 10/08/20 0120 10/09/20 0112  WBC 12.4* 16.9* 14.0* 14.4* 18.5*  HGB 14.8 14.3 14.1 14.0 13.9  HCT 43.6 41.2 39.3 40.0 41.2  PLT 239 245 238 254 260  MCV 89.0 88.4 88.5 88.7 90.5  MCH 30.2 30.7 31.8 31.0 30.5  MCHC 33.9 34.7 35.9 35.0 33.7  RDW 13.3 13.2 13.2 13.1 13.1    Chemistries  Recent Labs  Lab 10/04/20 0030 10/05/20 0149 10/06/20 0509 10/08/20 0120 10/09/20 0112  NA 132* 132* 131* 130* 132*  K 5.1 4.9 4.7 4.9 4.5  CL 105 101 100 99 99  CO2 18* 22 23 21* 25  GLUCOSE 194* 165* 161* 173* 166*  BUN 30* 26* 19 21* 23*  CREATININE 1.10 1.06 0.97 0.89 1.05  CALCIUM 8.2* 8.3* 8.2* 8.3* 8.5*  AST 18 22 20 21 23   ALT 58* 58* 45* 45* 51*  ALKPHOS 84 80 79 82 84  BILITOT 1.4* 1.4* 0.9 0.7 0.9   ------------------------------------------------------------------------------------------------------------------ No results for input(s): CHOL, HDL, LDLCALC, TRIG, CHOLHDL, LDLDIRECT in the last 72 hours.  No results found for:  HGBA1C ------------------------------------------------------------------------------------------------------------------ No results for input(s): TSH, T4TOTAL, T3FREE, THYROIDAB in the last 72 hours.  Invalid input(s): FREET3 ------------------------------------------------------------------------------------------------------------------ No results for input(s): VITAMINB12, FOLATE, FERRITIN, TIBC, IRON, RETICCTPCT in the last 72 hours.  Coagulation profile No results for input(s): INR, PROTIME in the last 168 hours.  No results for input(s): DDIMER in the last 72 hours.  Cardiac Enzymes No results for input(s): CKMB, TROPONINI, MYOGLOBIN in the last 168 hours.  Invalid input(s): CK ------------------------------------------------------------------------------------------------------------------    Component Value Date/Time   BNP 90.5 10/05/2020 0150    Micro Results No results found for this or any previous visit (from the past 240 hour(s)).  Radiology Reports CT ANGIO CHEST PE W OR WO CONTRAST  Result Date: 09/27/2020 CLINICAL DATA:  54 year old male with history of COVID-19, suspected pulmonary embolism. EXAM: CT ANGIOGRAPHY CHEST WITH CONTRAST TECHNIQUE: Multidetector CT imaging of the chest was performed using the standard protocol during bolus administration of intravenous contrast. Multiplanar CT image reconstructions and MIPs were obtained to evaluate the vascular anatomy. CONTRAST:  100 mL Omnipaque 350, intravenous COMPARISON:  Chest CT from 11/17/2019 FINDINGS: Cardiovascular: Satisfactory opacification of  the pulmonary arteries to the segmental level. No evidence of pulmonary embolism. Normal heart size. No pericardial effusion. The heart is normal in size. No pericardial effusion. Severe coronary atherosclerotic calcifications. Mediastinum/Nodes: Similar appearing prominent right paratracheal lymph node measuring up to 1.2 cm in short axis. Otherwise no enlarged  mediastinal, hilar, or axillary lymph nodes. Thyroid gland, trachea, and esophagus demonstrate no significant findings. Lungs/Pleura: Interval development of near diffuse, peripherally predominant ground-glass pulmonary opacities with a mid lung and basal predominance. Similar appearing moderate upper lobe predominant paraseptal and centrilobular emphysema. No pleural effusion or pneumothorax. Upper Abdomen: The visualized upper abdomen is within normal limits. Musculoskeletal: No chest wall abnormality. No acute or significant osseous findings. Review of the MIP images confirms the above findings. IMPRESSION: Vascular: 1. No evidence of pulmonary embolism. 2. Severe coronary atherosclerotic calcifications. Non-Vascular: 1. Diffuse, mid lung and basilar, peripherally predominant ground-glass opacities - findings compatible with multifocal pneumonia associated with COVID-19. 2. Similar appearing moderate upper lobe predominant centrilobular and paraseptal emphysema. Electronically Signed   By: Ruthann Cancer MD   On: 09/27/2020 14:31   DG Chest Port 1 View  Result Date: 10/06/2020 CLINICAL DATA:  COVID positive, pleural effusion, pneumothorax EXAM: PORTABLE CHEST 1 VIEW COMPARISON:  09/26/2020 FINDINGS: Slight interval increase in diffuse bilateral heterogeneous and interstitial airspace opacity, most conspicuous at the lung bases. No significant pleural effusion noted. No pneumothorax. The heart and mediastinum are unremarkable. IMPRESSION: 1. Slight interval increase in diffuse bilateral heterogeneous and interstitial airspace opacity, most conspicuous at the lung bases, consistent with worsened COVID airspace disease. 2.  No significant pleural effusion noted. No pneumothorax. Electronically Signed   By: Eddie Candle M.D.   On: 10/06/2020 09:37   DG CHEST PORT 1 VIEW  Result Date: 09/26/2020 CLINICAL DATA:  COVID-19 EXAM: PORTABLE CHEST 1 VIEW COMPARISON:  09/26/2020 FINDINGS: Slight worsening of  bilateral basilar predominant opacities. No pleural effusion or pneumothorax. Normal cardiomediastinal contours. IMPRESSION: Slight worsening of bilateral basilar predominant opacities. Electronically Signed   By: Ulyses Jarred M.D.   On: 09/26/2020 23:26   DG Chest Portable 1 View  Result Date: 09/26/2020 CLINICAL DATA:  Cough and shortness of breath. EXAM: PORTABLE CHEST 1 VIEW COMPARISON:  12/04/2019 FINDINGS: Cardiomegaly which is increased from prior exam. Pulmonary edema, moderate in degree. There are bilateral pleural effusions. No confluent consolidation. No pneumothorax. No acute osseous abnormalities are seen. IMPRESSION: CHF with cardiomegaly, pulmonary edema and bilateral pleural effusions. Electronically Signed   By: Keith Rake M.D.   On: 09/26/2020 19:14   ECHOCARDIOGRAM LIMITED  Result Date: 09/28/2020    ECHOCARDIOGRAM LIMITED REPORT   Patient Name:   Ryman Loletha Grayer Lemere Date of Exam: 09/28/2020 Medical Rec #:  [id number]          Height:       71.0 in Accession #:    [id number]         Weight:       244.3 lb Date of Birth:  03-30-1966          BSA:          2.296 m Patient Age:    69 years           BP:           120/84 mmHg Patient Gender: M                  HR:           73 bpm. Exam Location:  Inpatient Procedure: Limited Echo, Limited Color Doppler and Cardiac Doppler Indications:    acute respiratory distress  History:        Patient has prior history of Echocardiogram examinations, most                 recent 03/30/2016. CAD; Covid.  Sonographer:    Johny Chess Referring Phys: [id number] Candace Gallus MELVIN  Sonographer Comments: Image acquisition challenging due to respiratory motion. IMPRESSIONS  1. Left ventricular ejection fraction, by estimation, is 60 to 65%. The left ventricle has normal function. The left ventricle has no regional wall motion abnormalities. There is mild left ventricular hypertrophy.  2. Right ventricular systolic function is normal. The right  ventricular size is normal.  3. The mitral valve is normal in structure. No evidence of mitral valve regurgitation. No evidence of mitral stenosis.  4. The aortic valve is tricuspid.  5. The inferior vena cava is normal in size with greater than 50% respiratory variability, suggesting right atrial pressure of 3 mmHg. FINDINGS  Left Ventricle: Left ventricular ejection fraction, by estimation, is 60 to 65%. The left ventricle has normal function. The left ventricle has no regional wall motion abnormalities. The left ventricular internal cavity size was normal in size. There is  mild left ventricular hypertrophy. Right Ventricle: The right ventricular size is normal. No increase in right ventricular wall thickness. Right ventricular systolic function is normal. Pericardium: There is no evidence of pericardial effusion. Mitral Valve: The mitral valve is normal in structure. No evidence of mitral valve stenosis. Tricuspid Valve: The tricuspid valve is normal in structure. Tricuspid valve regurgitation is not demonstrated. No evidence of tricuspid stenosis. Aortic Valve: The aortic valve is tricuspid. Pulmonic Valve: The pulmonic valve was not well visualized. Pulmonic valve regurgitation is not visualized. No evidence of pulmonic stenosis. Aorta: The aortic root is normal in size and structure. Pulmonary Artery: Indeterminant PASP, inadequate TR jet. Venous: The inferior vena cava is normal in size with greater than 50% respiratory variability, suggesting right atrial pressure of 3 mmHg. IAS/Shunts: No atrial level shunt detected by color flow Doppler. LEFT VENTRICLE PLAX 2D LVIDd:         4.20 cm LVIDs:         2.80 cm LV PW:         1.30 cm LV IVS:        1.10 cm LVOT diam:     2.00 cm LV SV:         71 LV SV Index:   31 LVOT Area:     3.14 cm  IVC IVC diam: 1.70 cm LEFT ATRIUM         Index LA diam:    4.30 cm 1.87 cm/m  AORTIC VALVE LVOT Vmax:   116.00 cm/s LVOT Vmean:  76.100 cm/s LVOT VTI:    0.227 m  AORTA Ao  Root diam: 3.10 cm MITRAL VALVE MV Area (PHT): 2.91 cm    SHUNTS MV Decel Time: 261 msec    Systemic VTI:  0.23 m MV E velocity: 75.40 cm/s  Systemic Diam: 2.00 cm MV A velocity: 66.00 cm/s MV E/A ratio:  1.14 Carlyle Dolly MD Electronically signed by Carlyle Dolly MD Signature Date/Time: 09/28/2020/2:52:33 PM    Final

## 2020-10-09 NOTE — Progress Notes (Signed)
   10/09/20 1631  Assess: MEWS Score  Temp 98.2 F (36.8 C)  BP 98/65  Pulse Rate (!) 102  Resp 18  SpO2 96 %  O2 Device HFNC  O2 Flow Rate (L/min) 8 L/min  Assess: MEWS Score  MEWS Temp 0  MEWS Systolic 1  MEWS Pulse 1  MEWS RR 0  MEWS LOC 0  MEWS Score 2  MEWS Score Color Yellow  Assess: if the MEWS score is Yellow or Red  Were vital signs taken at a resting state? Yes  Focused Assessment No change from prior assessment  Early Detection of Sepsis Score *See Row Information* Low  MEWS guidelines implemented *See Row Information* Yes  Take Vital Signs  Increase Vital Sign Frequency  Yellow: Q 2hr X 2 then Q 4hr X 2, if remains yellow, continue Q 4hrs  Escalate  MEWS: Escalate Yellow: discuss with charge nurse/RN and consider discussing with provider and RRT  Notify: Charge Nurse/RN  Name of Charge Nurse/RN Notified Rodman Pickle RN  Date Charge Nurse/RN Notified 10/09/20  Time Charge Nurse/RN Notified 1635  Document  Patient Outcome Other (Comment)  Progress note created (see row info) Yes

## 2020-10-10 LAB — CBC
HCT: 39.2 % (ref 39.0–52.0)
Hemoglobin: 14 g/dL (ref 13.0–17.0)
MCH: 31.7 pg (ref 26.0–34.0)
MCHC: 35.7 g/dL (ref 30.0–36.0)
MCV: 88.7 fL (ref 80.0–100.0)
Platelets: 250 10*3/uL (ref 150–400)
RBC: 4.42 MIL/uL (ref 4.22–5.81)
RDW: 13 % (ref 11.5–15.5)
WBC: 17.1 10*3/uL — ABNORMAL HIGH (ref 4.0–10.5)
nRBC: 0 % (ref 0.0–0.2)

## 2020-10-10 LAB — COMPREHENSIVE METABOLIC PANEL
ALT: 53 U/L — ABNORMAL HIGH (ref 0–44)
AST: 22 U/L (ref 15–41)
Albumin: 2.4 g/dL — ABNORMAL LOW (ref 3.5–5.0)
Alkaline Phosphatase: 85 U/L (ref 38–126)
Anion gap: 9 (ref 5–15)
BUN: 19 mg/dL (ref 6–20)
CO2: 25 mmol/L (ref 22–32)
Calcium: 8.6 mg/dL — ABNORMAL LOW (ref 8.9–10.3)
Chloride: 99 mmol/L (ref 98–111)
Creatinine, Ser: 0.97 mg/dL (ref 0.61–1.24)
GFR, Estimated: >60 mL/min (ref >60)
Glucose, Bld: 193 mg/dL — ABNORMAL HIGH (ref 70–99)
Potassium: 4.5 mmol/L (ref 3.5–5.1)
Sodium: 133 mmol/L — ABNORMAL LOW (ref 135–145)
Total Bilirubin: 0.7 mg/dL (ref 0.3–1.2)
Total Protein: 6.2 g/dL — ABNORMAL LOW (ref 6.5–8.1)

## 2020-10-10 LAB — D-DIMER, QUANTITATIVE: D-Dimer, Quant: 1.51 ug/mL-FEU — ABNORMAL HIGH (ref 0.00–0.50)

## 2020-10-10 LAB — C-REACTIVE PROTEIN: CRP: 3.2 mg/dL — ABNORMAL HIGH (ref <1.0)

## 2020-10-10 MED ORDER — PREDNISONE 20 MG PO TABS
40.0000 mg | ORAL_TABLET | Freq: Every day | ORAL | Status: DC
  Administered 2020-10-11: 40 mg via ORAL
  Filled 2020-10-10: qty 2

## 2020-10-10 MED ORDER — FUROSEMIDE 10 MG/ML IJ SOLN
20.0000 mg | Freq: Once | INTRAMUSCULAR | Status: AC
  Administered 2020-10-10: 16:00:00 20 mg via INTRAVENOUS
  Filled 2020-10-10: qty 2

## 2020-10-10 MED ORDER — ENOXAPARIN SODIUM 60 MG/0.6ML ~~LOC~~ SOLN
55.0000 mg | SUBCUTANEOUS | Status: DC
  Administered 2020-10-10 – 2020-10-11 (×2): 55 mg via SUBCUTANEOUS
  Filled 2020-10-10 (×2): qty 0.6

## 2020-10-10 NOTE — Progress Notes (Signed)
PROGRESS NOTE                                                                                                                                                                                                             Patient Demographics:    Matthew [name], is a 54 y.o. male, DOB - 1966-09-19, [id number]  Outpatient Primary MD for the patient is Patient, No Pcp Per   Admit date - 09/26/2020   LOS - 14  No chief complaint on file.      Brief Narrative: Patient is a 54 y.o. male with PMHx of  CAD s/p PCI, tobacco use, HLD, GERD-who presented with shortness of breath-found to have acute hypoxic respiratory failure requiring 100% O2 via NRB due to COVID-19 pneumonia.  COVID-19 vaccinated status: Unvaccinated  Significant Events: 12/9>> started having GI symptoms including diarrhea 12/13>> to urgent care-for diarrhea-discharged after supportive care/IV fluids 12/16>> Admit to [facility] for hypoxia due to COVID-19 pneumonia-requiring 100% O2 via NRB 12/28>> hemoptysis-PCCM consulted-supportive care recommended.  Significant studies: 12/17>> CTA chest: No PE, diffuse groundglass opacities 12/18>> Echo: EF 60-65% 12/26>> chest x-ray: Diffuse bilateral heterogeneous/interstitial airspace opacities   COVID-19 medications: Steroids: 12/16>> Remdesivir: 12/16>> 12/20 Baricitinib: 12/17>>12/30  Antibiotics: None  Microbiology data: None  Procedures: None  Consults: PCCM  DVT prophylaxis: Lovenox held on 12/28 due to hemoptysis-resumed on 12/13 as no further episodes of hemoptysis    Subjective:   He feels great today-stable on 8 L of oxygen at rest-but requires around 15 L with ambulation.   Assessment  & Plan :   Acute Hypoxic Resp Failure due to Covid 19 Viral pneumonia: Had severe hypoxemia that is gradually improving-stable on 8 L of oxygen at rest-but requiring around 15 L HFNC with ambulation.   Continue tapering steroids-has completed a course of Remdesivir and baricitinib.  Although no evidence of volume overload-we will give 1 dose of IV Lasix to attempt to keep him in negative balance.  Note-discussed case with ID MD-Dr. Marcelline Mates improving-decreasing oxygen requirements for the past several days-it appears that he first had symptoms on 12/9.  He is currently more than 21 days out from his date of initial symptoms-and does not require any further isolation.   Fever: afebrile O2 requirements:  SpO2: 96 % O2 Flow Rate (L/min): 10 L/min FiO2 (%): (!) 15 %  COVID-19 Labs: Recent Labs    10/10/20 0156  DDIMER 1.51*  CRP 3.2*       Component Value Date/Time   BNP 90.5 10/05/2020 0150    No results for input(s): PROCALCITON in the last 168 hours.  Lab Results  Component Value Date   SARSCOV2NAA POSITIVE (A) 09/26/2020   SARSCOV2NAA NEGATIVE 02/06/2020   SARSCOV2NAA NEGATIVE 11/17/2019    Prone/Incentive Spirometry: encouraged  incentive spirometry use 3-4/hour.  Acute on chronic diastolic heart failure: Giving 1 dose of IV Lasix in an attempt to keep him in a negative balance.  Hemoptysis: Occurred on 12/28-likely related to underlying parenchymal injury from COVID-19 pneumonia.  Continue antitussives..  Continue supportive care-if persistent/reoccurs- will need PCCM reevaluation.  Transaminitis: Due to COVID-19-downtrending-follow periodically  CAD s/p PCI: No anginal symptoms-not on any antiplatelets at home.  Per patient report-PCI was approximately 11 years back  GERD: Continue PPI  Anxiety: Continue as needed Xanax  Constipation-continue MiraLAX  Obesity: Estimated body mass index is 32.1 kg/m as calculated from the following:   Height as of this encounter: 5\' 11"  (1.803 m).   Weight as of this encounter: 104.4 kg.    GI prophylaxis: PPI  ABG:    Component Value Date/Time   PHART 7.464 (H) 09/26/2020 2035   PCO2ART 31.7 (L) 09/26/2020 2035    PO2ART 90 09/26/2020 2035   HCO3 22.8 09/26/2020 2035   TCO2 24 09/26/2020 2035   ACIDBASEDEF 0.8 11/17/2019 1618   O2SAT 98.0 09/26/2020 2035    Vent Settings: N/A FiO2 (%):  [15 %] 15 %  Condition - Guarded  Family Communication  :  2036 [place] Margretta Sidle 12/30  Code Status :  Full Code  Diet :  Diet Order            Diet Heart Room service appropriate? No; Fluid consistency: Thin; Fluid restriction: 2000 mL Fluid  Diet effective now                  Disposition Plan  :   Status is: Inpatient  Remains inpatient appropriate because:Inpatient level of care appropriate due to severity of illness   Dispo: The patient is from: Home              Anticipated d/c is to: Home              Anticipated d/c date is: > 3 days              Patient currently is not medically stable to d/c.   Barriers to discharge: Hypoxia requiring O2 supplementation  Antimicorbials  :    Anti-infectives (From admission, onward)   Start     Dose/Rate Route Frequency Ordered Stop   09/27/20 1000  remdesivir 100 mg in sodium chloride 0.9 % 100 mL IVPB       "Followed by" Linked Group Details   100 mg 200 mL/hr over 30 Minutes Intravenous Daily 09/26/20 2208 09/30/20 1900   09/26/20 2215  remdesivir 200 mg in sodium chloride 0.9% 250 mL IVPB       "Followed by" Linked Group Details   200 mg 580 mL/hr over 30 Minutes Intravenous Once 09/26/20 2208 09/27/20 0146      Inpatient Medications  Scheduled Meds: . benzonatate  200 mg Oral TID  . enoxaparin (LOVENOX) injection  55 mg Subcutaneous Q24H  . feeding supplement  237 mL Oral BID BM  . melatonin  3 mg Oral QHS  . methylPREDNISolone (SOLU-MEDROL) injection  40 mg Intravenous BID  . pantoprazole  40 mg Oral Daily  . polyethylene glycol  17 g Oral Daily  . sodium chloride flush  3 mL Intravenous Q12H   Continuous Infusions: PRN Meds:.acetaminophen, albuterol, ALPRAZolam, bisacodyl, chlorpheniramine-HYDROcodone,  menthol-cetylpyridinium, ondansetron (ZOFRAN) IV, senna-docusate, sodium chloride   Time Spent in minutes  35   See all Orders from today for further details   Oren Binet M.D on 10/10/2020 at 2:45 PM  To page go to [email or web address] - use universal password  [facility] -  Office  [phone]    Objective:   Vitals:   10/10/20 0728 10/10/20 0828 10/10/20 1131 10/10/20 1230  BP: (!) 78/58 105/68  110/74  Pulse:      Resp: 20 20  20   Temp: (!) 97.4 F (36.3 C) 97.8 F (36.6 C)  98.6 F (37 C)  TempSrc: Axillary Axillary  Axillary  SpO2: 95% 97% 93% 96%  Weight:      Height:        Wt Readings from Last 3 Encounters:  10/10/20 104.4 kg  12/04/19 108.4 kg  11/20/19 107.9 kg     Intake/Output Summary (Last 24 hours) at 10/10/2020 1445 Last data filed at 10/10/2020 1440 Gross per 24 hour  Intake 480 ml  Output 1500 ml  Net -1020 ml     Physical Exam Gen Exam:Alert awake-not in any distress HEENT:atraumatic, normocephalic Chest: B/L clear to auscultation anteriorly CVS:S1S2 regular Abdomen:soft non tender, non distended Extremities:no edema Neurology: Non focal Skin: no rash  Data Review:    CBC Recent Labs  Lab 10/05/20 0149 10/06/20 0509 10/08/20 0120 10/09/20 0112 10/10/20 0156  WBC 16.9* 14.0* 14.4* 18.5* 17.1*  HGB 14.3 14.1 14.0 13.9 14.0  HCT 41.2 39.3 40.0 41.2 39.2  PLT 245 238 254 260 250  MCV 88.4 88.5 88.7 90.5 88.7  MCH 30.7 31.8 31.0 30.5 31.7  MCHC 34.7 35.9 35.0 33.7 35.7  RDW 13.2 13.2 13.1 13.1 13.0    Chemistries  Recent Labs  Lab 10/05/20 0149 10/06/20 0509 10/08/20 0120 10/09/20 0112 10/10/20 0156  NA 132* 131* 130* 132* 133*  K 4.9 4.7 4.9 4.5 4.5  CL 101 100 99 99 99  CO2 22 23 21* 25 25  GLUCOSE 165* 161* 173* 166* 193*  BUN 26* 19 21* 23* 19  CREATININE 1.06 0.97 0.89 1.05 0.97  CALCIUM 8.3* 8.2* 8.3* 8.5* 8.6*  AST 22 20 21 23 22   ALT 58* 45* 45* 51* 53*  ALKPHOS 80 79 82 84 85  BILITOT 1.4*  0.9 0.7 0.9 0.7   ------------------------------------------------------------------------------------------------------------------ No results for input(s): CHOL, HDL, LDLCALC, TRIG, CHOLHDL, LDLDIRECT in the last 72 hours.  No results found for: HGBA1C ------------------------------------------------------------------------------------------------------------------ No results for input(s): TSH, T4TOTAL, T3FREE, THYROIDAB in the last 72 hours.  Invalid input(s): FREET3 ------------------------------------------------------------------------------------------------------------------ No results for input(s): VITAMINB12, FOLATE, FERRITIN, TIBC, IRON, RETICCTPCT in the last 72 hours.  Coagulation profile No results for input(s): INR, PROTIME in the last 168 hours.  Recent Labs    10/10/20 0156  DDIMER 1.51*    Cardiac Enzymes No results for input(s): CKMB, TROPONINI, MYOGLOBIN in the last 168 hours.  Invalid input(s): CK ------------------------------------------------------------------------------------------------------------------    Component Value Date/Time   BNP 90.5 10/05/2020 0150    Micro Results No results found for this or any previous visit (from the past 240 hour(s)).  Radiology Reports CT ANGIO CHEST PE W OR WO CONTRAST  Result Date: 09/27/2020 CLINICAL DATA:  54 year old male with history  of COVID-19, suspected pulmonary embolism. EXAM: CT ANGIOGRAPHY CHEST WITH CONTRAST TECHNIQUE: Multidetector CT imaging of the chest was performed using the standard protocol during bolus administration of intravenous contrast. Multiplanar CT image reconstructions and MIPs were obtained to evaluate the vascular anatomy. CONTRAST:  100 mL Omnipaque 350, intravenous COMPARISON:  Chest CT from 11/17/2019 FINDINGS: Cardiovascular: Satisfactory opacification of the pulmonary arteries to the segmental level. No evidence of pulmonary embolism. Normal heart size. No pericardial effusion.  The heart is normal in size. No pericardial effusion. Severe coronary atherosclerotic calcifications. Mediastinum/Nodes: Similar appearing prominent right paratracheal lymph node measuring up to 1.2 cm in short axis. Otherwise no enlarged mediastinal, hilar, or axillary lymph nodes. Thyroid gland, trachea, and esophagus demonstrate no significant findings. Lungs/Pleura: Interval development of near diffuse, peripherally predominant ground-glass pulmonary opacities with a mid lung and basal predominance. Similar appearing moderate upper lobe predominant paraseptal and centrilobular emphysema. No pleural effusion or pneumothorax. Upper Abdomen: The visualized upper abdomen is within normal limits. Musculoskeletal: No chest wall abnormality. No acute or significant osseous findings. Review of the MIP images confirms the above findings. IMPRESSION: Vascular: 1. No evidence of pulmonary embolism. 2. Severe coronary atherosclerotic calcifications. Non-Vascular: 1. Diffuse, mid lung and basilar, peripherally predominant ground-glass opacities - findings compatible with multifocal pneumonia associated with COVID-19. 2. Similar appearing moderate upper lobe predominant centrilobular and paraseptal emphysema. Electronically Signed   By: Ruthann Cancer MD   On: 09/27/2020 14:31   DG Chest Port 1 View  Result Date: 10/06/2020 CLINICAL DATA:  COVID positive, pleural effusion, pneumothorax EXAM: PORTABLE CHEST 1 VIEW COMPARISON:  09/26/2020 FINDINGS: Slight interval increase in diffuse bilateral heterogeneous and interstitial airspace opacity, most conspicuous at the lung bases. No significant pleural effusion noted. No pneumothorax. The heart and mediastinum are unremarkable. IMPRESSION: 1. Slight interval increase in diffuse bilateral heterogeneous and interstitial airspace opacity, most conspicuous at the lung bases, consistent with worsened COVID airspace disease. 2.  No significant pleural effusion noted. No  pneumothorax. Electronically Signed   By: Eddie Candle M.D.   On: 10/06/2020 09:37   DG CHEST PORT 1 VIEW  Result Date: 09/26/2020 CLINICAL DATA:  COVID-19 EXAM: PORTABLE CHEST 1 VIEW COMPARISON:  09/26/2020 FINDINGS: Slight worsening of bilateral basilar predominant opacities. No pleural effusion or pneumothorax. Normal cardiomediastinal contours. IMPRESSION: Slight worsening of bilateral basilar predominant opacities. Electronically Signed   By: Ulyses Jarred M.D.   On: 09/26/2020 23:26   DG Chest Portable 1 View  Result Date: 09/26/2020 CLINICAL DATA:  Cough and shortness of breath. EXAM: PORTABLE CHEST 1 VIEW COMPARISON:  12/04/2019 FINDINGS: Cardiomegaly which is increased from prior exam. Pulmonary edema, moderate in degree. There are bilateral pleural effusions. No confluent consolidation. No pneumothorax. No acute osseous abnormalities are seen. IMPRESSION: CHF with cardiomegaly, pulmonary edema and bilateral pleural effusions. Electronically Signed   By: Keith Rake M.D.   On: 09/26/2020 19:14   ECHOCARDIOGRAM LIMITED  Result Date: 09/28/2020    ECHOCARDIOGRAM LIMITED REPORT   Patient Name:   Milan Loletha Grayer Grover Date of Exam: 09/28/2020 Medical Rec #:  [id number]          Height:       71.0 in Accession #:    [id number]         Weight:       244.3 lb Date of Birth:  [birth year]          BSA:          2.296 m Patient Age:  54 years           BP:           120/84 mmHg Patient Gender: M                  HR:           73 bpm. Exam Location:  Inpatient Procedure: Limited Echo, Limited Color Doppler and Cardiac Doppler Indications:    acute respiratory distress  History:        Patient has prior history of Echocardiogram examinations, most                 recent 03/30/2016. CAD; Covid.  Sonographer:    Johny Chess Referring Phys: [id number] Candace Gallus MELVIN  Sonographer Comments: Image acquisition challenging due to respiratory motion. IMPRESSIONS  1. Left ventricular ejection fraction, by  estimation, is 60 to 65%. The left ventricle has normal function. The left ventricle has no regional wall motion abnormalities. There is mild left ventricular hypertrophy.  2. Right ventricular systolic function is normal. The right ventricular size is normal.  3. The mitral valve is normal in structure. No evidence of mitral valve regurgitation. No evidence of mitral stenosis.  4. The aortic valve is tricuspid.  5. The inferior vena cava is normal in size with greater than 50% respiratory variability, suggesting right atrial pressure of 3 mmHg. FINDINGS  Left Ventricle: Left ventricular ejection fraction, by estimation, is 60 to 65%. The left ventricle has normal function. The left ventricle has no regional wall motion abnormalities. The left ventricular internal cavity size was normal in size. There is  mild left ventricular hypertrophy. Right Ventricle: The right ventricular size is normal. No increase in right ventricular wall thickness. Right ventricular systolic function is normal. Pericardium: There is no evidence of pericardial effusion. Mitral Valve: The mitral valve is normal in structure. No evidence of mitral valve stenosis. Tricuspid Valve: The tricuspid valve is normal in structure. Tricuspid valve regurgitation is not demonstrated. No evidence of tricuspid stenosis. Aortic Valve: The aortic valve is tricuspid. Pulmonic Valve: The pulmonic valve was not well visualized. Pulmonic valve regurgitation is not visualized. No evidence of pulmonic stenosis. Aorta: The aortic root is normal in size and structure. Pulmonary Artery: Indeterminant PASP, inadequate TR jet. Venous: The inferior vena cava is normal in size with greater than 50% respiratory variability, suggesting right atrial pressure of 3 mmHg. IAS/Shunts: No atrial level shunt detected by color flow Doppler. LEFT VENTRICLE PLAX 2D LVIDd:         4.20 cm LVIDs:         2.80 cm LV PW:         1.30 cm LV IVS:        1.10 cm LVOT diam:     2.00 cm LV  SV:         71 LV SV Index:   31 LVOT Area:     3.14 cm  IVC IVC diam: 1.70 cm LEFT ATRIUM         Index LA diam:    4.30 cm 1.87 cm/m  AORTIC VALVE LVOT Vmax:   116.00 cm/s LVOT Vmean:  76.100 cm/s LVOT VTI:    0.227 m  AORTA Ao Root diam: 3.10 cm MITRAL VALVE MV Area (PHT): 2.91 cm    SHUNTS MV Decel Time: 261 msec    Systemic VTI:  0.23 m MV E velocity: 75.40 cm/s  Systemic Diam: 2.00 cm MV A velocity: 66.00 cm/s MV  E/A ratio:  1.14 Carlyle Dolly MD Electronically signed by Carlyle Dolly MD Signature Date/Time: 09/28/2020/2:52:33 PM    Final

## 2020-10-10 NOTE — Progress Notes (Signed)
Hemoptysis resolved per Dr. Jerral Ralph and can resume prophylaxis Lovenox.  Ulyses Southward, PharmD, BCIDP, AAHIVP, CPP Infectious Disease Pharmacist 10/10/2020 10:44 AM

## 2020-10-10 NOTE — TOC Progression Note (Signed)
Transition of Care [facility]) - Progression Note    Patient Details  Name: KAMERIN GRUMBINE MRN: [id number] Date of Birth: January 11, 1966  [facility] CM/SW Contact  Lockie Pares, RN Phone Number: 10/10/2020, 4:49 PM  Clinical Narrative:     Over isolation period when sx first started, continue to require high oxygen flow. Had period of hemoptysis. Continue to wean slowly. Will likely need home oxygen charity and M[facility] for medications.   Expected Discharge Plan: Home/Self Care Barriers to Discharge: Continued Medical Work up  Expected Discharge Plan and Services Expected Discharge Plan: Home/Self Care   Discharge Planning Services: CM Consult Post Acute Care Choice: Durable Medical Equipment Living arrangements for the past 2 months: Single Family Home                                       Social Determinants of Health (SDOH) Interventions    Readmission Risk Interventions No flowsheet data found.

## 2020-10-11 ENCOUNTER — Inpatient Hospital Stay (HOSPITAL_COMMUNITY): Payer: HRSA Program

## 2020-10-11 DIAGNOSIS — J9601 Acute respiratory failure with hypoxia: Secondary | ICD-10-CM

## 2020-10-11 DIAGNOSIS — U071 COVID-19: Secondary | ICD-10-CM

## 2020-10-11 LAB — CBC
HCT: 41.7 % (ref 39.0–52.0)
Hemoglobin: 14.2 g/dL (ref 13.0–17.0)
MCH: 30.5 pg (ref 26.0–34.0)
MCHC: 34.1 g/dL (ref 30.0–36.0)
MCV: 89.5 fL (ref 80.0–100.0)
Platelets: 231 10*3/uL (ref 150–400)
RBC: 4.66 MIL/uL (ref 4.22–5.81)
RDW: 13.2 % (ref 11.5–15.5)
WBC: 16.8 10*3/uL — ABNORMAL HIGH (ref 4.0–10.5)
nRBC: 0 % (ref 0.0–0.2)

## 2020-10-11 LAB — COMPREHENSIVE METABOLIC PANEL
ALT: 54 U/L — ABNORMAL HIGH (ref 0–44)
AST: 20 U/L (ref 15–41)
Albumin: 2.7 g/dL — ABNORMAL LOW (ref 3.5–5.0)
Alkaline Phosphatase: 93 U/L (ref 38–126)
Anion gap: 11 (ref 5–15)
BUN: 23 mg/dL — ABNORMAL HIGH (ref 6–20)
CO2: 26 mmol/L (ref 22–32)
Calcium: 8.6 mg/dL — ABNORMAL LOW (ref 8.9–10.3)
Chloride: 99 mmol/L (ref 98–111)
Creatinine, Ser: 0.95 mg/dL (ref 0.61–1.24)
GFR, Estimated: >60 mL/min (ref >60)
Glucose, Bld: 120 mg/dL — ABNORMAL HIGH (ref 70–99)
Potassium: 3.9 mmol/L (ref 3.5–5.1)
Sodium: 136 mmol/L (ref 135–145)
Total Bilirubin: 0.5 mg/dL (ref 0.3–1.2)
Total Protein: 6.5 g/dL (ref 6.5–8.1)

## 2020-10-11 LAB — D-DIMER, QUANTITATIVE: D-Dimer, Quant: 1.97 ug/mL-FEU — ABNORMAL HIGH (ref 0.00–0.50)

## 2020-10-11 LAB — BRAIN NATRIURETIC PEPTIDE: B Natriuretic Peptide: 53.8 pg/mL (ref 0.0–100.0)

## 2020-10-11 LAB — C-REACTIVE PROTEIN: CRP: 3.1 mg/dL — ABNORMAL HIGH (ref <1.0)

## 2020-10-11 LAB — PROCALCITONIN: Procalcitonin: <0.1 ng/mL

## 2020-10-11 MED ORDER — SODIUM CHLORIDE 0.9% FLUSH
10.0000 mL | Freq: Two times a day (BID) | INTRAVENOUS | Status: DC
  Administered 2020-10-11 – 2020-10-25 (×23): 10 mL

## 2020-10-11 MED ORDER — CLONAZEPAM 0.5 MG PO TABS
0.5000 mg | ORAL_TABLET | Freq: Three times a day (TID) | ORAL | Status: DC | PRN
Start: 2020-10-11 — End: 2020-10-19
  Administered 2020-10-11 – 2020-10-19 (×20): 0.5 mg via ORAL
  Filled 2020-10-11 (×21): qty 1

## 2020-10-11 MED ORDER — SODIUM CHLORIDE 0.9% FLUSH: 10.0000 mL | INTRAVENOUS | Status: DC | PRN

## 2020-10-11 MED ORDER — ENOXAPARIN SODIUM 100 MG/ML ~~LOC~~ SOLN
100.0000 mg | Freq: Two times a day (BID) | SUBCUTANEOUS | Status: DC
  Administered 2020-10-12 – 2020-10-13 (×3): 100 mg via SUBCUTANEOUS
  Filled 2020-10-11 (×3): qty 1

## 2020-10-11 MED ORDER — METHYLPREDNISOLONE SODIUM SUCC 40 MG IJ SOLR
40.0000 mg | Freq: Two times a day (BID) | INTRAMUSCULAR | Status: DC
  Administered 2020-10-11 – 2020-10-17 (×13): 40 mg via INTRAVENOUS
  Filled 2020-10-11 (×13): qty 1

## 2020-10-11 MED ORDER — ENOXAPARIN SODIUM 60 MG/0.6ML ~~LOC~~ SOLN
50.0000 mg | Freq: Once | SUBCUTANEOUS | Status: AC
  Administered 2020-10-11: 50 mg via SUBCUTANEOUS
  Filled 2020-10-11: qty 0.6

## 2020-10-11 NOTE — Progress Notes (Signed)
0315: patient reported that he woke up with chocking feeling. He shows SOB and dyspnea. He was on 8L of HF with nonrebreather mask. Bumped up to 15L of HF with nonrebreather mask. Xanax and albuterol inhaler administered. Patient feels better and went back to sleep. O2 sat upto 98% with 15 L of HF so wean down to 10L of HF. He has been sleeping.    At 0630: patient woke up with SOB again. Back up to 15L of HF with Nonrebreather mask but it seems not working. O2 sat has been kept dropping. RT called and Heated high flow applied - 35L with 100% with nonrebreather mask. He is up in chair at this time. Will continue to assess.

## 2020-10-11 NOTE — Progress Notes (Signed)
   10/11/20 1019  Assess: MEWS Score  Temp 99.4 F (37.4 C)  BP 94/69  Pulse Rate (!) 113  ECG Heart Rate (!) 115  SpO2 100 %  Assess: MEWS Score  MEWS Temp 0  MEWS Systolic 1  MEWS Pulse 2  MEWS RR 1  MEWS LOC 0  MEWS Score 4  MEWS Score Color Red  Assess: if the MEWS score is Yellow or Red  Were vital signs taken at a resting state? Yes  Focused Assessment No change from prior assessment  Early Detection of Sepsis Score *See Row Information* Low  MEWS guidelines implemented *See Row Information* Yes  Treat  MEWS Interventions Administered prn meds/treatments;Escalated (See documentation below) (Dr notified and is here seeing patient, see new orders)  Take Vital Signs  Increase Vital Sign Frequency  Red: Q 1hr X 4 then Q 4hr X 4, if remains red, continue Q 4hrs  Escalate  MEWS: Escalate Red: discuss with charge nurse/RN and provider, consider discussing with RRT  Notify: Charge Nurse/RN  Name of Charge Nurse/RN Notified Elisa, RN  Date Charge Nurse/RN Notified 10/11/20  Time Charge Nurse/RN Notified 1030  Notify: Provider  Provider Name/Title Jeoffrey Massed MD  Date Provider Notified 10/11/20  Time Provider Notified 1019  Notification Type Face-to-face  Notification Reason Other (Comment) (Red MEWS)  Response See new orders  Date of Provider Response 10/11/20  Time of Provider Response 1020

## 2020-10-11 NOTE — Plan of Care (Signed)

## 2020-10-11 NOTE — Progress Notes (Signed)
ANTICOAGULATION CONSULT NOTE - Initial Consult  Pharmacy Consult for lovenox Indication: r/o DVT/PE in setting of COVID and elevated d-dimer  Allergies  Allergen Reactions  . Bee Venom   . Codeine Nausea And Vomiting  . Onion Swelling    "thoart swells up"    Patient Measurements: Height: 5\' 11"  (180.3 cm) Weight: 103.6 kg (228 lb 6.3 oz) IBW/kg (Calculated) : 75.3  Vital Signs: Temp: 97.9 F (36.6 C) (12/31 1300) Temp Source: Oral (12/31 1300) BP: 110/79 (12/31 1300) Pulse Rate: 95 (12/31 1300)  Labs: Recent Labs    10/09/20 0112 10/10/20 0156 10/11/20 0108  HGB 13.9 14.0 14.2  HCT 41.2 39.2 41.7  PLT 260 250 231  CREATININE 1.05 0.97 0.95    Estimated Creatinine Clearance: 106.4 mL/min (by C-G formula based on SCr of 0.95 mg/dL).   Medical History: Past Medical History:  Diagnosis Date  . Coronary artery disease per pt last cardiology visit 2011 (found documentation 12-05-2009 w/ dr 12-07-2009 with [facility] in epic)/  currently pt is followed by pcp   12-28-2008  PCI and DES x2 to proximal and mid RCA/ last cardiac cath 12-09-2009  widely patent stents  . GERD (gastroesophageal reflux disease)   . Hyperlipidemia   . Olecranon bursitis of left elbow   . Respiratory failure (HCC)   . S/P right coronary artery (RCA) stent placement 12/28/2008   PCI and DES x2-- proximal and mid RCA  . Wears dentures    UPPER    Assessment: 1 YOM presenting with SOB, COVID + and D-dimer 4.30 to start therapeutic lovenox for rule out of DVT/PE and d-dimer trend.  Not on anticoagulation PTA.  He has decompensated on O2. CT was reordered to r/o PE but he couldn't lay flat. Order has been ordered to increase lovenox to full dose empirically. He hemoptysis the other day has resolved.   Goal of Therapy:  Anti-Xa level 0.6-1 units/ml 4hrs after LMWH dose given Monitor platelets by anticoagulation protocol: Yes   Plan:  Lovenox 1mg /kg SQ every 12 hours Monitor renal function, s/s  bleeding, watch plts F/u DVT/PE workup and D-dimer trend  57, PharmD, BCIDP, AAHIVP, CPP Infectious Disease Pharmacist 10/11/2020 3:00 PM

## 2020-10-11 NOTE — Progress Notes (Signed)
PT Cancellation Note  Patient Details Name: Matthew [name] MRN: [id number] DOB: [birth year]   Cancelled Treatment:    Reason Eval/Treat Not Completed: Medical issues which prohibited therapy. Pt with worsening respiratory status overnight.   Angelina Ok [facility] 10/11/2020, 12:39 PM [name] PT Acute Rehabilitation Services Pager [phone] Office [phone]

## 2020-10-11 NOTE — Progress Notes (Signed)
Patient up in chair, anxiety relieved some, breathing easier, v/s have been obtained per protocol, consult placed for IV team so patient may go for CTA, O2 Sat 95% on HHFNC 35% no non rebreather at this time. Spoke to the wife and provided updates, will continue to monitor patient and v/s

## 2020-10-11 NOTE — Progress Notes (Signed)
Bilateral lower extremity venous study completed.   Preliminary note for Jean Rosenthal RDMS  Gave results to RN.   Please see CV Proc for preliminary results.   Clint Guy, RVT

## 2020-10-11 NOTE — Progress Notes (Signed)
PROGRESS NOTE                                                                                                                                                                                                             Patient Demographics:    Matthew [name], is a 54 y.o. male, DOB - 1966/02/09, [id number]  Outpatient Primary MD for the patient is Patient, No Pcp Per   Admit date - 09/26/2020   LOS - 15  No chief complaint on file.      Brief Narrative: Patient is a 54 y.o. male with PMHx of  CAD s/p PCI, tobacco use, HLD, GERD-who presented with shortness of breath-found to have acute hypoxic respiratory failure requiring 100% O2 via NRB due to COVID-19 pneumonia.  COVID-19 vaccinated status: Unvaccinated  Significant Events: 12/9>> started having GI symptoms including diarrhea 12/13>> to urgent care-for diarrhea-discharged after supportive care/IV fluids 12/16>> Admit to [facility] for hypoxia due to COVID-19 pneumonia-requiring 100% O2 via NRB 12/28>> hemoptysis-PCCM consulted-supportive care recommended. 12/31>> worsening hypoxemia-started on heated high flow-unable to lie flat for a CTA chest  Significant studies: 12/17>> CTA chest: No PE, diffuse groundglass opacities 12/18>> Echo: EF 60-65% 12/26>> chest x-ray: Diffuse bilateral heterogeneous/interstitial airspace opacities 12/31>> chest x-ray: Stable bilateral lung opacities   COVID-19 medications: Steroids: 12/16>> Remdesivir: 12/16>> 12/20 Baricitinib: 12/17>>12/30  Antibiotics: None  Microbiology data: None  Procedures: None  Consults: PCCM  DVT prophylaxis: Lovenox held on 12/28 due to hemoptysis-resumed on 12/13 as no further episodes of hemoptysis    Subjective:   Apparently was choking-short of breath soon as he woke up-now on heated high flow.  Very anxious-"I do not want to die doc".   Assessment  & Plan :   Acute Hypoxic Resp  Failure due to Covid 19 Viral pneumonia: Had severe hypoxemia on initial presentation-gradually improved-was relatively stable on 8 L of oxygen at rest (but required 15 L with ambulation)-until 12/30-this morning-hypoxemia has worsened significantly-and is now on heated high flow.  No signs of volume overload.  Procalcitonin levels within normal limits-hence doubt bacterial infection.  Tried getting a CT of his chest to rule out PE-but he is unable to lie flat.  Plan is to empirically start him on therapeutic anticoagulation-and obtain a lower extremity Doppler.  Although his hypoxia is worse than yesterday-he seems to have made  some improvement compared to this morning-he has a significant component of anxiety contributing as well-I have stopped his Xanax-and have transition him to Klonopin.  Continue to monitor closely-he remains a full code-if he deteriorates significantly-he will need to be moved to the ICU.   Note-discussed case with ID MD-Dr. Aldine Contes improving-decreasing oxygen requirements for the past several days-it appears that he first had symptoms on 12/9.  He is currently more than 21 days out from his date of initial symptoms-and does not require any further isolation-however due to worsening hypoxemia-I have elected to keep him in 5 W.   Fever: afebrile O2 requirements:  SpO2: 95 % O2 Flow Rate (L/min): 10 L/min FiO2 (%): 25 %   COVID-19 Labs: Recent Labs    10/10/20 0156 10/11/20 0108 10/11/20 1024  DDIMER 1.51*  --  1.97*  CRP 3.2* 3.1*  --        Component Value Date/Time   BNP 53.8 10/11/2020 1024    Recent Labs  Lab 10/11/20 1024  PROCALCITON <0.10    Lab Results  Component Value Date   SARSCOV2NAA POSITIVE (A) 09/26/2020   SARSCOV2NAA NEGATIVE 02/06/2020   SARSCOV2NAA NEGATIVE 11/17/2019    Prone/Incentive Spirometry: encouraged  incentive spirometry use 3-4/hour.  Acute on chronic diastolic heart failure: No evidence of volume overload-hold  diuretics today.  Hemoptysis: Isolated episode that occurred on 12/28-likely related to underlying parenchymal injury from COVID-19 pneumonia.  No further episodes since then.  Continue to monitor closely-if reoccurs-we will reconsult PCCM.  Transaminitis: Due to COVID-19-downtrending-follow periodically  CAD s/p PCI: No anginal symptoms-not on any antiplatelets at home.  Per patient report-PCI was approximately 11 years back  GERD: Continue PPI  Anxiety: Continues to have significant amount of anxiety playing a role in his symptoms-stopping Xanax-starting Klonopin.  If he continues to have symptoms-May benefit from initiation of Lexapro.  Constipation-continue MiraLAX  Obesity: Estimated body mass index is 31.85 kg/m as calculated from the following:   Height as of this encounter: 5\' 11"  (1.803 m).   Weight as of this encounter: 103.6 kg.    GI prophylaxis: PPI  ABG:    Component Value Date/Time   PHART 7.464 (H) 09/26/2020 2035   PCO2ART 31.7 (L) 09/26/2020 2035   PO2ART 90 09/26/2020 2035   HCO3 22.8 09/26/2020 2035   TCO2 24 09/26/2020 2035   ACIDBASEDEF 0.8 11/17/2019 1618   O2SAT 98.0 09/26/2020 2035    Vent Settings: N/A FiO2 (%):  [25 %-35 %] 25 %  Condition - Guarded  Family Communication  :  2036 [address] Margretta Sidle 12/31  Code Status :  Full Code  Diet :  Diet Order            Diet Heart Room service appropriate? No; Fluid consistency: Thin; Fluid restriction: 2000 mL Fluid  Diet effective now                  Disposition Plan  :   Status is: Inpatient  Remains inpatient appropriate because:Inpatient level of care appropriate due to severity of illness   Dispo: The patient is from: Home              Anticipated d/c is to: Home              Anticipated d/c date is: > 3 days              Patient currently is not medically stable to d/c.   Barriers to discharge: Hypoxia requiring O2 supplementation  Antimicorbials  :     Anti-infectives (From admission, onward)   Start     Dose/Rate Route Frequency Ordered Stop   09/27/20 1000  remdesivir 100 mg in sodium chloride 0.9 % 100 mL IVPB       "Followed by" Linked Group Details   100 mg 200 mL/hr over 30 Minutes Intravenous Daily 09/26/20 2208 09/30/20 1900   09/26/20 2215  remdesivir 200 mg in sodium chloride 0.9% 250 mL IVPB       "Followed by" Linked Group Details   200 mg 580 mL/hr over 30 Minutes Intravenous Once 09/26/20 2208 09/27/20 0146      Inpatient Medications  Scheduled Meds: . benzonatate  200 mg Oral TID  . enoxaparin (LOVENOX) injection  55 mg Subcutaneous Q24H  . feeding supplement  237 mL Oral BID BM  . melatonin  3 mg Oral QHS  . methylPREDNISolone (SOLU-MEDROL) injection  40 mg Intravenous Q12H  . pantoprazole  40 mg Oral Daily  . polyethylene glycol  17 g Oral Daily  . sodium chloride flush  10-40 mL Intracatheter Q12H  . sodium chloride flush  3 mL Intravenous Q12H   Continuous Infusions: PRN Meds:.acetaminophen, albuterol, bisacodyl, chlorpheniramine-HYDROcodone, clonazePAM, menthol-cetylpyridinium, ondansetron (ZOFRAN) IV, senna-docusate, sodium chloride, sodium chloride flush   Time Spent in minutes  35   See all Orders from today for further details   Oren Binet M.D on 10/11/2020 at 2:52 PM  To page go to [email or web address] - use universal password  [facility] -  Office  [phone]    Objective:   Vitals:   10/11/20 1019 10/11/20 1049 10/11/20 1300 10/11/20 1432  BP: 94/69 90/63 110/79   Pulse: (!) 113 (!) 109 95   Resp:  20    Temp: 99.4 F (37.4 C) 98.1 F (36.7 C) 97.9 F (36.6 C)   TempSrc: Axillary  Oral   SpO2: 100% 100% 99% 95%  Weight:      Height:        Wt Readings from Last 3 Encounters:  10/11/20 103.6 kg  12/04/19 108.4 kg  11/20/19 107.9 kg     Intake/Output Summary (Last 24 hours) at 10/11/2020 1452 Last data filed at 10/11/2020 1056 Gross per 24 hour  Intake 480 ml   Output 1275 ml  Net -795 ml     Physical Exam Gen Exam:Alert awake-not in any distress HEENT:atraumatic, normocephalic Chest: B/L clear to auscultation anteriorly CVS:S1S2 regular Abdomen:soft non tender, non distended Extremities:no edema Neurology: Non focal Skin: no rash   Data Review:    CBC Recent Labs  Lab 10/06/20 0509 10/08/20 0120 10/09/20 0112 10/10/20 0156 10/11/20 0108  WBC 14.0* 14.4* 18.5* 17.1* 16.8*  HGB 14.1 14.0 13.9 14.0 14.2  HCT 39.3 40.0 41.2 39.2 41.7  PLT 238 254 260 250 231  MCV 88.5 88.7 90.5 88.7 89.5  MCH 31.8 31.0 30.5 31.7 30.5  MCHC 35.9 35.0 33.7 35.7 34.1  RDW 13.2 13.1 13.1 13.0 13.2    Chemistries  Recent Labs  Lab 10/06/20 0509 10/08/20 0120 10/09/20 0112 10/10/20 0156 10/11/20 0108  NA 131* 130* 132* 133* 136  K 4.7 4.9 4.5 4.5 3.9  CL 100 99 99 99 99  CO2 23 21* 25 25 26   GLUCOSE 161* 173* 166* 193* 120*  BUN 19 21* 23* 19 23*  CREATININE 0.97 0.89 1.05 0.97 0.95  CALCIUM 8.2* 8.3* 8.5* 8.6* 8.6*  AST 20 21 23 22 20   ALT 45* 45* 51* 53* 54*  ALKPHOS  79 82 84 85 93  BILITOT 0.9 0.7 0.9 0.7 0.5   ------------------------------------------------------------------------------------------------------------------ No results for input(s): CHOL, HDL, LDLCALC, TRIG, CHOLHDL, LDLDIRECT in the last 72 hours.  No results found for: HGBA1C ------------------------------------------------------------------------------------------------------------------ No results for input(s): TSH, T4TOTAL, T3FREE, THYROIDAB in the last 72 hours.  Invalid input(s): FREET3 ------------------------------------------------------------------------------------------------------------------ No results for input(s): VITAMINB12, FOLATE, FERRITIN, TIBC, IRON, RETICCTPCT in the last 72 hours.  Coagulation profile No results for input(s): INR, PROTIME in the last 168 hours.  Recent Labs    10/10/20 0156 10/11/20 1024  DDIMER 1.51* 1.97*     Cardiac Enzymes No results for input(s): CKMB, TROPONINI, MYOGLOBIN in the last 168 hours.  Invalid input(s): CK ------------------------------------------------------------------------------------------------------------------    Component Value Date/Time   BNP 53.8 10/11/2020 1024    Micro Results No results found for this or any previous visit (from the past 240 hour(s)).  Radiology Reports CT ANGIO CHEST PE W OR WO CONTRAST  Result Date: 09/27/2020 CLINICAL DATA:  54 year old male with history of COVID-19, suspected pulmonary embolism. EXAM: CT ANGIOGRAPHY CHEST WITH CONTRAST TECHNIQUE: Multidetector CT imaging of the chest was performed using the standard protocol during bolus administration of intravenous contrast. Multiplanar CT image reconstructions and MIPs were obtained to evaluate the vascular anatomy. CONTRAST:  100 mL Omnipaque 350, intravenous COMPARISON:  Chest CT from 11/17/2019 FINDINGS: Cardiovascular: Satisfactory opacification of the pulmonary arteries to the segmental level. No evidence of pulmonary embolism. Normal heart size. No pericardial effusion. The heart is normal in size. No pericardial effusion. Severe coronary atherosclerotic calcifications. Mediastinum/Nodes: Similar appearing prominent right paratracheal lymph node measuring up to 1.2 cm in short axis. Otherwise no enlarged mediastinal, hilar, or axillary lymph nodes. Thyroid gland, trachea, and esophagus demonstrate no significant findings. Lungs/Pleura: Interval development of near diffuse, peripherally predominant ground-glass pulmonary opacities with a mid lung and basal predominance. Similar appearing moderate upper lobe predominant paraseptal and centrilobular emphysema. No pleural effusion or pneumothorax. Upper Abdomen: The visualized upper abdomen is within normal limits. Musculoskeletal: No chest wall abnormality. No acute or significant osseous findings. Review of the MIP images confirms the above  findings. IMPRESSION: Vascular: 1. No evidence of pulmonary embolism. 2. Severe coronary atherosclerotic calcifications. Non-Vascular: 1. Diffuse, mid lung and basilar, peripherally predominant ground-glass opacities - findings compatible with multifocal pneumonia associated with COVID-19. 2. Similar appearing moderate upper lobe predominant centrilobular and paraseptal emphysema. Electronically Signed   By: Ruthann Cancer MD   On: 09/27/2020 14:31   DG Chest Port 1 View  Result Date: 10/06/2020 CLINICAL DATA:  COVID positive, pleural effusion, pneumothorax EXAM: PORTABLE CHEST 1 VIEW COMPARISON:  09/26/2020 FINDINGS: Slight interval increase in diffuse bilateral heterogeneous and interstitial airspace opacity, most conspicuous at the lung bases. No significant pleural effusion noted. No pneumothorax. The heart and mediastinum are unremarkable. IMPRESSION: 1. Slight interval increase in diffuse bilateral heterogeneous and interstitial airspace opacity, most conspicuous at the lung bases, consistent with worsened COVID airspace disease. 2.  No significant pleural effusion noted. No pneumothorax. Electronically Signed   By: Eddie Candle M.D.   On: 10/06/2020 09:37   DG CHEST PORT 1 VIEW  Result Date: 09/26/2020 CLINICAL DATA:  COVID-19 EXAM: PORTABLE CHEST 1 VIEW COMPARISON:  09/26/2020 FINDINGS: Slight worsening of bilateral basilar predominant opacities. No pleural effusion or pneumothorax. Normal cardiomediastinal contours. IMPRESSION: Slight worsening of bilateral basilar predominant opacities. Electronically Signed   By: Ulyses Jarred M.D.   On: 09/26/2020 23:26   DG Chest Portable 1 View  Result Date: 09/26/2020  CLINICAL DATA:  Cough and shortness of breath. EXAM: PORTABLE CHEST 1 VIEW COMPARISON:  12/04/2019 FINDINGS: Cardiomegaly which is increased from prior exam. Pulmonary edema, moderate in degree. There are bilateral pleural effusions. No confluent consolidation. No pneumothorax. No acute  osseous abnormalities are seen. IMPRESSION: CHF with cardiomegaly, pulmonary edema and bilateral pleural effusions. Electronically Signed   By: Keith Rake M.D.   On: 09/26/2020 19:14   DG Chest Port 1V same Day  Result Date: 10/11/2020 CLINICAL DATA:  Hypoxia.  Shortness of breath. EXAM: PORTABLE CHEST 1 VIEW COMPARISON:  October 06, 2020. FINDINGS: The heart size and mediastinal contours are within normal limits. No pneumothorax or pleural effusion is noted. Stable bilateral lung opacities are noted concerning for multifocal pneumonia. The visualized skeletal structures are unremarkable. IMPRESSION: Stable bilateral lung opacities are noted concerning for multifocal pneumonia. Electronically Signed   By: Marijo Conception M.D.   On: 10/11/2020 10:06   ECHOCARDIOGRAM LIMITED  Result Date: 09/28/2020    ECHOCARDIOGRAM LIMITED REPORT   Patient Name:   Matthew [name] Date of Exam: 09/28/2020 Medical Rec #:  [id number]          Height:       71.0 in Accession #:    [id number]         Weight:       244.3 lb Date of Birth:  [birth year]          BSA:          2.296 m Patient Age:    32 years           BP:           120/84 mmHg Patient Gender: M                  HR:           73 bpm. Exam Location:  Inpatient Procedure: Limited Echo, Limited Color Doppler and Cardiac Doppler Indications:    acute respiratory distress  History:        Patient has prior history of Echocardiogram examinations, most                 recent 03/30/2016. CAD; Covid.  Sonographer:    Johny Chess Referring Phys: [id number] Candace Gallus MELVIN  Sonographer Comments: Image acquisition challenging due to respiratory motion. IMPRESSIONS  1. Left ventricular ejection fraction, by estimation, is 60 to 65%. The left ventricle has normal function. The left ventricle has no regional wall motion abnormalities. There is mild left ventricular hypertrophy.  2. Right ventricular systolic function is normal. The right ventricular size is normal.   3. The mitral valve is normal in structure. No evidence of mitral valve regurgitation. No evidence of mitral stenosis.  4. The aortic valve is tricuspid.  5. The inferior vena cava is normal in size with greater than 50% respiratory variability, suggesting right atrial pressure of 3 mmHg. FINDINGS  Left Ventricle: Left ventricular ejection fraction, by estimation, is 60 to 65%. The left ventricle has normal function. The left ventricle has no regional wall motion abnormalities. The left ventricular internal cavity size was normal in size. There is  mild left ventricular hypertrophy. Right Ventricle: The right ventricular size is normal. No increase in right ventricular wall thickness. Right ventricular systolic function is normal. Pericardium: There is no evidence of pericardial effusion. Mitral Valve: The mitral valve is normal in structure. No evidence of mitral valve stenosis. Tricuspid Valve: The tricuspid valve is normal in structure.  Tricuspid valve regurgitation is not demonstrated. No evidence of tricuspid stenosis. Aortic Valve: The aortic valve is tricuspid. Pulmonic Valve: The pulmonic valve was not well visualized. Pulmonic valve regurgitation is not visualized. No evidence of pulmonic stenosis. Aorta: The aortic root is normal in size and structure. Pulmonary Artery: Indeterminant PASP, inadequate TR jet. Venous: The inferior vena cava is normal in size with greater than 50% respiratory variability, suggesting right atrial pressure of 3 mmHg. IAS/Shunts: No atrial level shunt detected by color flow Doppler. LEFT VENTRICLE PLAX 2D LVIDd:         4.20 cm LVIDs:         2.80 cm LV PW:         1.30 cm LV IVS:        1.10 cm LVOT diam:     2.00 cm LV SV:         71 LV SV Index:   31 LVOT Area:     3.14 cm  IVC IVC diam: 1.70 cm LEFT ATRIUM         Index LA diam:    4.30 cm 1.87 cm/m  AORTIC VALVE LVOT Vmax:   116.00 cm/s LVOT Vmean:  76.100 cm/s LVOT VTI:    0.227 m  AORTA Ao Root diam: 3.10 cm MITRAL  VALVE MV Area (PHT): 2.91 cm    SHUNTS MV Decel Time: 261 msec    Systemic VTI:  0.23 m MV E velocity: 75.40 cm/s  Systemic Diam: 2.00 cm MV A velocity: 66.00 cm/s MV E/A ratio:  1.14 Carlyle Dolly MD Electronically signed by Carlyle Dolly MD Signature Date/Time: 09/28/2020/2:52:33 PM    Final

## 2020-10-11 NOTE — Progress Notes (Signed)
Patient very anxious c/o shortness of breath, O2 sat 93% on HHFNC at 35% on non rebreather mask at 15L, v/s obtained placed in yellow MEWS, Dr here to see patient and was notified of patient having difficulty through the night, Dr placed orders, Patient is up in chair. Medications administered for anxiety per order. Will continue to monitor patient.

## 2020-10-11 NOTE — Progress Notes (Signed)
OT Cancellation Note  Patient Details Name: Matthew [name] MRN: [id number] DOB: 21-Apr-1966   Cancelled Treatment:    Reason Eval/Treat Not Completed: Medical issues which prohibited therapy (Pt with worsening repsiratory status and increased SpO2 demands. Plans to go for CT of chest. Will hold until more medically stable.)  Dalphine Handing, MSOT, OTR/L Acute Rehabilitation Services [facility] Office Number: [phone] Pager: [phone]  Dalphine Handing 10/11/2020, 1:41 PM

## 2020-10-12 LAB — COMPREHENSIVE METABOLIC PANEL
ALT: 55 U/L — ABNORMAL HIGH (ref 0–44)
AST: 21 U/L (ref 15–41)
Albumin: 2.3 g/dL — ABNORMAL LOW (ref 3.5–5.0)
Alkaline Phosphatase: 84 U/L (ref 38–126)
Anion gap: 10 (ref 5–15)
BUN: 24 mg/dL — ABNORMAL HIGH (ref 6–20)
CO2: 26 mmol/L (ref 22–32)
Calcium: 8.4 mg/dL — ABNORMAL LOW (ref 8.9–10.3)
Chloride: 101 mmol/L (ref 98–111)
Creatinine, Ser: 1.12 mg/dL (ref 0.61–1.24)
GFR, Estimated: >60 mL/min (ref >60)
Glucose, Bld: 166 mg/dL — ABNORMAL HIGH (ref 70–99)
Potassium: 4.7 mmol/L (ref 3.5–5.1)
Sodium: 137 mmol/L (ref 135–145)
Total Bilirubin: 0.8 mg/dL (ref 0.3–1.2)
Total Protein: 5.9 g/dL — ABNORMAL LOW (ref 6.5–8.1)

## 2020-10-12 LAB — CBC
HCT: 38.9 % — ABNORMAL LOW (ref 39.0–52.0)
Hemoglobin: 13.4 g/dL (ref 13.0–17.0)
MCH: 31.6 pg (ref 26.0–34.0)
MCHC: 34.4 g/dL (ref 30.0–36.0)
MCV: 91.7 fL (ref 80.0–100.0)
Platelets: 224 10*3/uL (ref 150–400)
RBC: 4.24 MIL/uL (ref 4.22–5.81)
RDW: 13.3 % (ref 11.5–15.5)
WBC: 16.7 10*3/uL — ABNORMAL HIGH (ref 4.0–10.5)
nRBC: 0 % (ref 0.0–0.2)

## 2020-10-12 LAB — C-REACTIVE PROTEIN: CRP: 12.2 mg/dL — ABNORMAL HIGH (ref <1.0)

## 2020-10-12 LAB — D-DIMER, QUANTITATIVE: D-Dimer, Quant: 1.19 ug/mL-FEU — ABNORMAL HIGH (ref 0.00–0.50)

## 2020-10-12 MED ORDER — FUROSEMIDE 10 MG/ML IJ SOLN
40.0000 mg | Freq: Once | INTRAMUSCULAR | Status: AC
  Administered 2020-10-12: 40 mg via INTRAVENOUS
  Filled 2020-10-12: qty 4

## 2020-10-12 MED ORDER — ESCITALOPRAM OXALATE 10 MG PO TABS
5.0000 mg | ORAL_TABLET | Freq: Every day | ORAL | Status: DC
  Administered 2020-10-12 – 2020-10-16 (×5): 5 mg via ORAL
  Filled 2020-10-12 (×5): qty 1

## 2020-10-12 MED ORDER — MAGNESIUM SULFATE 2 GM/50ML IV SOLN
2.0000 g | Freq: Once | INTRAVENOUS | Status: AC
  Administered 2020-10-12: 2 g via INTRAVENOUS
  Filled 2020-10-12: qty 50

## 2020-10-12 NOTE — Progress Notes (Signed)
Patient called out stated he was coughing and had a bowel movement in the bed, nurse tech went in to bath patient and while bathing patient non rebreather mask busted and patient oxygen sat dropped to 70%, immediately replaced non rebreather but took several minutes for patient to recover, O2 sat now 91% on 25L/100% and non rebreather in use also, patient back in bed lying on his right side. Will continue to monitor patient.

## 2020-10-12 NOTE — Progress Notes (Addendum)
PROGRESS NOTE                                                                                                                                                                                                             Patient Demographics:    Matthew [name], is a 55 y.o. male, DOB - [birth year], ZL:[id number]  Outpatient Primary MD for the patient is Patient, No Pcp Per   Admit date - 09/26/2020   LOS - 16  No chief complaint on file.      Brief Narrative: Patient is a 55 y.o. male with PMHx of  CAD s/p PCI, tobacco use, HLD, GERD-who presented with shortness of breath-found to have acute hypoxic respiratory failure requiring 100% O2 via NRB due to COVID-19 pneumonia.  COVID-19 vaccinated status: Unvaccinated  Significant Events: 12/9>> started having GI symptoms including diarrhea 12/13>> to urgent care-for diarrhea-discharged after supportive care/IV fluids 12/16>> Admit to [facility] for hypoxia due to COVID-19 pneumonia-requiring 100% O2 via NRB 12/28>> hemoptysis-PCCM consulted-supportive care recommended. 12/31>> worsening hypoxemia-started on heated high flow-unable to lie flat for a CTA chest  Significant studies: 12/17>> CTA chest: No PE, diffuse groundglass opacities 12/18>> Echo: EF 60-65% 12/26>> chest x-ray: Diffuse bilateral heterogeneous/interstitial airspace opacities 12/31>> chest x-ray: Stable bilateral lung opacities 12/31>> lower extremity Doppler: No DVT   COVID-19 medications: Steroids: 12/16>> Remdesivir: 12/16>> 12/20 Baricitinib: 12/17>>12/30  Antibiotics: None  Microbiology data: None  Procedures: None  Consults: PCCM  DVT prophylaxis: Lovenox held on 12/28 due to hemoptysis-resumed on 12/13 as no further episodes of hemoptysis    Subjective:   He feels much better than yesterday-anxiety continues to be a big problem for him.  Claims that he does better better with breathing  after lunch.   Assessment  & Plan :   Acute Hypoxic Resp Failure due to Covid 19 Viral pneumonia: Continues to have severe hypoxemia-on 12/30-had significant worsening of his hypoxemia (from 8 L at rest to now requiring HHFNC).  Unable to tolerate CTA chest on 12/31 as he was not able to lie flat as he felt "hot"-however lower extremity Dopplers negative. Given very mildly elevated D-dimer-I doubt VTE-has been started on empiric therapeutic Lovenox in any event.  Have asked nursing staff to see if we can reattempt a CTA chest today.  In the meantime-continue with supportive care-remains on IV steroids.  Although he has no signs of volume overload-I will give him 1 dose of IV Lasix to ensure negative balance.  Anxiety continues to play a major role as well-and he remains on Klonopin.  Continue to watch closely-if he deteriorates significantly-he will require transfer to the ICU.  Note-discussed case with ID MD-Dr. Marcelline Mates improving-decreasing oxygen requirements for the past several days-it appears that he first had symptoms on 12/9.  He is currently more than 21 days out from his date of initial symptoms-and does not require any further isolation-however due to worsening hypoxemia-I have elected to keep him in 5 W.   Fever: afebrile O2 requirements:  SpO2: 98 % O2 Flow Rate (L/min): 25 L/min FiO2 (%): 100 %   COVID-19 Labs: Recent Labs    10/10/20 0156 10/11/20 0108 10/11/20 1024 10/12/20 0345  DDIMER 1.51*  --  1.97* 1.19*  CRP 3.2* 3.1*  --  12.2*       Component Value Date/Time   BNP 53.8 10/11/2020 1024    Recent Labs  Lab 10/11/20 1024  PROCALCITON <0.10    Lab Results  Component Value Date   SARSCOV2NAA POSITIVE (A) 09/26/2020   SARSCOV2NAA NEGATIVE 02/06/2020   [place] NEGATIVE 11/17/2019    Prone/Incentive Spirometry: encouraged  incentive spirometry use 3-4/hour.  Acute on chronic diastolic heart failure: No evidence of volume overload-1 dose of IV  Lasix today to ensure negative balance.  Hemoptysis: Isolated episode that occurred on 12/28-likely related to underlying parenchymal injury from COVID-19 pneumonia.  No further episodes since then.  Continue to monitor closely-if reoccurs-we will reconsult PCCM.  Transaminitis: Due to COVID-19-downtrending-follow periodically  CAD s/p PCI: No anginal symptoms-not on any antiplatelets at home.  Per patient report-PCI was approximately 11 years back  GERD: Continue PPI  Anxiety: Continues to have significant amount of anxiety playing a role in his symptoms-stopping Xanax-starting Klonopin.  If he continues to have symptoms-May benefit from initiation of Lexapro.  Constipation-continue MiraLAX  Obesity: Estimated body mass index is 31.39 kg/m as calculated from the following:   Height as of this encounter: 5\' 11"  (1.803 m).   Weight as of this encounter: 102.1 kg.    GI prophylaxis: PPI  ABG:    Component Value Date/Time   PHART 7.464 (H) 09/26/2020 2035   PCO2ART 31.7 (L) 09/26/2020 2035   PO2ART 90 09/26/2020 2035   HCO3 22.8 09/26/2020 2035   TCO2 24 09/26/2020 2035   ACIDBASEDEF 0.8 11/17/2019 1618   O2SAT 98.0 09/26/2020 2035    Vent Settings: N/A FiO2 (%):  [25 %-100 %] 100 %  Condition - Guarded  Family Communication  :  Spouse-Diana Custer [phone]-left a voicemail on 1/2  Code Status :  Full Code  Diet :  Diet Order            Diet Heart Room service appropriate? No; Fluid consistency: Thin; Fluid restriction: 2000 mL Fluid  Diet effective now                  Disposition Plan  :   Status is: Inpatient  Remains inpatient appropriate because:Inpatient level of care appropriate due to severity of illness   Dispo: The patient is from: Home              Anticipated d/c is to: Home              Anticipated d/c date is: > 3 days  Patient currently is not medically stable to d/c.   Barriers to discharge: Hypoxia requiring O2  supplementation  Antimicorbials  :    Anti-infectives (From admission, onward)   Start     Dose/Rate Route Frequency Ordered Stop   09/27/20 1000  remdesivir 100 mg in sodium chloride 0.9 % 100 mL IVPB       "Followed by" Linked Group Details   100 mg 200 mL/hr over 30 Minutes Intravenous Daily 09/26/20 2208 09/30/20 1900   09/26/20 2215  remdesivir 200 mg in sodium chloride 0.9% 250 mL IVPB       "Followed by" Linked Group Details   200 mg 580 mL/hr over 30 Minutes Intravenous Once 09/26/20 2208 09/27/20 0146      Inpatient Medications  Scheduled Meds: . benzonatate  200 mg Oral TID  . enoxaparin (LOVENOX) injection  100 mg Subcutaneous Q12H  . feeding supplement  237 mL Oral BID BM  . furosemide  40 mg Intravenous Once  . melatonin  3 mg Oral QHS  . methylPREDNISolone (SOLU-MEDROL) injection  40 mg Intravenous Q12H  . pantoprazole  40 mg Oral Daily  . polyethylene glycol  17 g Oral Daily  . sodium chloride flush  10-40 mL Intracatheter Q12H  . sodium chloride flush  3 mL Intravenous Q12H   Continuous Infusions: . magnesium sulfate bolus IVPB     PRN Meds:.acetaminophen, albuterol, bisacodyl, clonazePAM, menthol-cetylpyridinium, ondansetron (ZOFRAN) IV, senna-docusate, sodium chloride, sodium chloride flush   Time Spent in minutes  35   See all Orders from today for further details   Oren Binet M.D on 10/12/2020 at 11:58 AM  To page go to [email or web address] - use universal password  [facility] -  Office  [phone]    Objective:   Vitals:   10/12/20 0756 10/12/20 0954 10/12/20 1049 10/12/20 1147  BP: 100/70   115/78  Pulse: 93 (!) 108  99  Resp:  (!) 24    Temp: 98.4 F (36.9 C)   97.8 F (36.6 C)  TempSrc: Axillary   Oral  SpO2: 95% 93% (!) 88% 98%  Weight:      Height:        Wt Readings from Last 3 Encounters:  10/12/20 102.1 kg  12/04/19 108.4 kg  11/20/19 107.9 kg     Intake/Output Summary (Last 24 hours) at 10/12/2020 1158 Last data  filed at 10/12/2020 1017 Gross per 24 hour  Intake 1080 ml  Output 550 ml  Net 530 ml     Physical Exam Gen Exam:Alert awake-not in any distress HEENT:atraumatic, normocephalic Chest: B/L clear to auscultation anteriorly CVS:S1S2 regular Abdomen:soft non tender, non distended Extremities:no edema Neurology: Non focal Skin: no rash   Data Review:    CBC Recent Labs  Lab 10/08/20 0120 10/09/20 0112 10/10/20 0156 10/11/20 0108 10/12/20 0345  WBC 14.4* 18.5* 17.1* 16.8* 16.7*  HGB 14.0 13.9 14.0 14.2 13.4  HCT 40.0 41.2 39.2 41.7 38.9*  PLT 254 260 250 231 224  MCV 88.7 90.5 88.7 89.5 91.7  MCH 31.0 30.5 31.7 30.5 31.6  MCHC 35.0 33.7 35.7 34.1 34.4  RDW 13.1 13.1 13.0 13.2 13.3    Chemistries  Recent Labs  Lab 10/08/20 0120 10/09/20 0112 10/10/20 0156 10/11/20 0108 10/12/20 0345  NA 130* 132* 133* 136 137  K 4.9 4.5 4.5 3.9 4.7  CL 99 99 99 99 101  CO2 21* 25 25 26 26   GLUCOSE 173* 166* 193* 120* 166*  BUN 21* 23* 19  23* 24*  CREATININE 0.89 1.05 0.97 0.95 1.12  CALCIUM 8.3* 8.5* 8.6* 8.6* 8.4*  AST 21 23 22 20 21   ALT 45* 51* 53* 54* 55*  ALKPHOS 82 84 85 93 84  BILITOT 0.7 0.9 0.7 0.5 0.8   ------------------------------------------------------------------------------------------------------------------ No results for input(s): CHOL, HDL, LDLCALC, TRIG, CHOLHDL, LDLDIRECT in the last 72 hours.  No results found for: HGBA1C ------------------------------------------------------------------------------------------------------------------ No results for input(s): TSH, T4TOTAL, T3FREE, THYROIDAB in the last 72 hours.  Invalid input(s): FREET3 ------------------------------------------------------------------------------------------------------------------ No results for input(s): VITAMINB12, FOLATE, FERRITIN, TIBC, IRON, RETICCTPCT in the last 72 hours.  Coagulation profile No results for input(s): INR, PROTIME in the last 168 hours.  Recent Labs     10/11/20 1024 10/12/20 0345  DDIMER 1.97* 1.19*    Cardiac Enzymes No results for input(s): CKMB, TROPONINI, MYOGLOBIN in the last 168 hours.  Invalid input(s): CK ------------------------------------------------------------------------------------------------------------------    Component Value Date/Time   BNP 53.8 10/11/2020 1024    Micro Results No results found for this or any previous visit (from the past 240 hour(s)).  Radiology Reports CT ANGIO CHEST PE W OR WO CONTRAST  Result Date: 09/27/2020 CLINICAL DATA:  55 year old male with history of COVID-19, suspected pulmonary embolism. EXAM: CT ANGIOGRAPHY CHEST WITH CONTRAST TECHNIQUE: Multidetector CT imaging of the chest was performed using the standard protocol during bolus administration of intravenous contrast. Multiplanar CT image reconstructions and MIPs were obtained to evaluate the vascular anatomy. CONTRAST:  100 mL Omnipaque 350, intravenous COMPARISON:  Chest CT from 11/17/2019 FINDINGS: Cardiovascular: Satisfactory opacification of the pulmonary arteries to the segmental level. No evidence of pulmonary embolism. Normal heart size. No pericardial effusion. The heart is normal in size. No pericardial effusion. Severe coronary atherosclerotic calcifications. Mediastinum/Nodes: Similar appearing prominent right paratracheal lymph node measuring up to 1.2 cm in short axis. Otherwise no enlarged mediastinal, hilar, or axillary lymph nodes. Thyroid gland, trachea, and esophagus demonstrate no significant findings. Lungs/Pleura: Interval development of near diffuse, peripherally predominant ground-glass pulmonary opacities with a mid lung and basal predominance. Similar appearing moderate upper lobe predominant paraseptal and centrilobular emphysema. No pleural effusion or pneumothorax. Upper Abdomen: The visualized upper abdomen is within normal limits. Musculoskeletal: No chest wall abnormality. No acute or significant osseous  findings. Review of the MIP images confirms the above findings. IMPRESSION: Vascular: 1. No evidence of pulmonary embolism. 2. Severe coronary atherosclerotic calcifications. Non-Vascular: 1. Diffuse, mid lung and basilar, peripherally predominant ground-glass opacities - findings compatible with multifocal pneumonia associated with COVID-19. 2. Similar appearing moderate upper lobe predominant centrilobular and paraseptal emphysema. Electronically Signed   By: Ruthann Cancer MD   On: 09/27/2020 14:31   DG Chest Port 1 View  Result Date: 10/06/2020 CLINICAL DATA:  COVID positive, pleural effusion, pneumothorax EXAM: PORTABLE CHEST 1 VIEW COMPARISON:  09/26/2020 FINDINGS: Slight interval increase in diffuse bilateral heterogeneous and interstitial airspace opacity, most conspicuous at the lung bases. No significant pleural effusion noted. No pneumothorax. The heart and mediastinum are unremarkable. IMPRESSION: 1. Slight interval increase in diffuse bilateral heterogeneous and interstitial airspace opacity, most conspicuous at the lung bases, consistent with worsened COVID airspace disease. 2.  No significant pleural effusion noted. No pneumothorax. Electronically Signed   By: Eddie Candle M.D.   On: 10/06/2020 09:37   DG CHEST PORT 1 VIEW  Result Date: 09/26/2020 CLINICAL DATA:  COVID-19 EXAM: PORTABLE CHEST 1 VIEW COMPARISON:  09/26/2020 FINDINGS: Slight worsening of bilateral basilar predominant opacities. No pleural effusion or pneumothorax. Normal cardiomediastinal contours. IMPRESSION:  Slight worsening of bilateral basilar predominant opacities. Electronically Signed   By: Deatra Robinson M.D.   On: 09/26/2020 23:26   DG Chest Portable 1 View  Result Date: 09/26/2020 CLINICAL DATA:  Cough and shortness of breath. EXAM: PORTABLE CHEST 1 VIEW COMPARISON:  12/04/2019 FINDINGS: Cardiomegaly which is increased from prior exam. Pulmonary edema, moderate in degree. There are bilateral pleural effusions. No  confluent consolidation. No pneumothorax. No acute osseous abnormalities are seen. IMPRESSION: CHF with cardiomegaly, pulmonary edema and bilateral pleural effusions. Electronically Signed   By: Narda Rutherford M.D.   On: 09/26/2020 19:14   DG Chest Port 1V same Day  Result Date: 10/11/2020 CLINICAL DATA:  Hypoxia.  Shortness of breath. EXAM: PORTABLE CHEST 1 VIEW COMPARISON:  October 06, 2020. FINDINGS: The heart size and mediastinal contours are within normal limits. No pneumothorax or pleural effusion is noted. Stable bilateral lung opacities are noted concerning for multifocal pneumonia. The visualized skeletal structures are unremarkable. IMPRESSION: Stable bilateral lung opacities are noted concerning for multifocal pneumonia. Electronically Signed   By: Lupita Raider M.D.   On: 10/11/2020 10:06   VAS [place] LOWER EXTREMITY VENOUS (DVT)  Result Date: 10/12/2020  Lower Venous DVT Study Indications: Worsening hypoxia, Covid-19.  Comparison Study: No prior studies. Performing Technologist: Jean Rosenthal, RDMS  Examination Guidelines: A complete evaluation includes B-mode imaging, spectral Doppler, color Doppler, and power Doppler as needed of all accessible portions of each vessel. Bilateral testing is considered an integral part of a complete examination. Limited examinations for reoccurring indications may be performed as noted. The reflux portion of the exam is performed with the patient in reverse Trendelenburg.  +---------+---------------+---------+-----------+----------+--------------+ RIGHT    CompressibilityPhasicitySpontaneityPropertiesThrombus Aging +---------+---------------+---------+-----------+----------+--------------+ CFV      Full           Yes      Yes                                 +---------+---------------+---------+-----------+----------+--------------+ SFJ      Full                                                         +---------+---------------+---------+-----------+----------+--------------+ FV Prox  Full                                                        +---------+---------------+---------+-----------+----------+--------------+ FV Mid   Full                                                        +---------+---------------+---------+-----------+----------+--------------+ FV DistalFull                                                        +---------+---------------+---------+-----------+----------+--------------+ PFV      Full                                                        +---------+---------------+---------+-----------+----------+--------------+  POP      Full           Yes      Yes                                 +---------+---------------+---------+-----------+----------+--------------+ PTV      Full                                                        +---------+---------------+---------+-----------+----------+--------------+ PERO     Full                                                        +---------+---------------+---------+-----------+----------+--------------+   +---------+---------------+---------+-----------+----------+--------------+ LEFT     CompressibilityPhasicitySpontaneityPropertiesThrombus Aging +---------+---------------+---------+-----------+----------+--------------+ CFV      Full           Yes      Yes                                 +---------+---------------+---------+-----------+----------+--------------+ SFJ      Full                                                        +---------+---------------+---------+-----------+----------+--------------+ FV Prox  Full                                                        +---------+---------------+---------+-----------+----------+--------------+ FV Mid   Full                                                         +---------+---------------+---------+-----------+----------+--------------+ FV DistalFull                                                        +---------+---------------+---------+-----------+----------+--------------+ PFV      Full                                                        +---------+---------------+---------+-----------+----------+--------------+ POP      Full           Yes      Yes                                 +---------+---------------+---------+-----------+----------+--------------+  PTV      Full                                                        +---------+---------------+---------+-----------+----------+--------------+ PERO     Full                                                        +---------+---------------+---------+-----------+----------+--------------+     Summary: RIGHT: - There is no evidence of deep vein thrombosis in the lower extremity.  - No cystic structure found in the popliteal fossa.  LEFT: - There is no evidence of deep vein thrombosis in the lower extremity.  - No cystic structure found in the popliteal fossa.  *See table(s) above for measurements and observations. Electronically signed by Ruta Hinds MD on 10/12/2020 at 10:00:37 AM.    Final    ECHOCARDIOGRAM LIMITED  Result Date: 09/28/2020    ECHOCARDIOGRAM LIMITED REPORT   Patient Name:   Dougles Loletha Grayer [place] Date of Exam: 09/28/2020 Medical Rec #:  [id number]          Height:       71.0 in Accession #:    [id number]         Weight:       244.3 lb Date of Birth:  08-Feb-1966          BSA:          2.296 m Patient Age:    22 years           BP:           120/84 mmHg Patient Gender: M                  HR:           73 bpm. Exam Location:  Inpatient Procedure: Limited Echo, Limited Color Doppler and Cardiac Doppler Indications:    acute respiratory distress  History:        Patient has prior history of Echocardiogram examinations, most                 recent 03/30/2016. CAD;  Covid.  Sonographer:    Johny Chess Referring Phys: [id number] Candace Gallus MELVIN  Sonographer Comments: Image acquisition challenging due to respiratory motion. IMPRESSIONS  1. Left ventricular ejection fraction, by estimation, is 60 to 65%. The left ventricle has normal function. The left ventricle has no regional wall motion abnormalities. There is mild left ventricular hypertrophy.  2. Right ventricular systolic function is normal. The right ventricular size is normal.  3. The mitral valve is normal in structure. No evidence of mitral valve regurgitation. No evidence of mitral stenosis.  4. The aortic valve is tricuspid.  5. The inferior vena cava is normal in size with greater than 50% respiratory variability, suggesting right atrial pressure of 3 mmHg. FINDINGS  Left Ventricle: Left ventricular ejection fraction, by estimation, is 60 to 65%. The left ventricle has normal function. The left ventricle has no regional wall motion abnormalities. The left ventricular internal cavity size was normal in size. There is  mild left ventricular hypertrophy. Right Ventricle: The right ventricular size is normal. No increase  in right ventricular wall thickness. Right ventricular systolic function is normal. Pericardium: There is no evidence of pericardial effusion. Mitral Valve: The mitral valve is normal in structure. No evidence of mitral valve stenosis. Tricuspid Valve: The tricuspid valve is normal in structure. Tricuspid valve regurgitation is not demonstrated. No evidence of tricuspid stenosis. Aortic Valve: The aortic valve is tricuspid. Pulmonic Valve: The pulmonic valve was not well visualized. Pulmonic valve regurgitation is not visualized. No evidence of pulmonic stenosis. Aorta: The aortic root is normal in size and structure. Pulmonary Artery: Indeterminant PASP, inadequate TR jet. Venous: The inferior vena cava is normal in size with greater than 50% respiratory variability, suggesting right atrial  pressure of 3 mmHg. IAS/Shunts: No atrial level shunt detected by color flow Doppler. LEFT VENTRICLE PLAX 2D LVIDd:         4.20 cm LVIDs:         2.80 cm LV PW:         1.30 cm LV IVS:        1.10 cm LVOT diam:     2.00 cm LV SV:         71 LV SV Index:   31 LVOT Area:     3.14 cm  IVC IVC diam: 1.70 cm LEFT ATRIUM         Index LA diam:    4.30 cm 1.87 cm/m  AORTIC VALVE LVOT Vmax:   116.00 cm/s LVOT Vmean:  76.100 cm/s LVOT VTI:    0.227 m  AORTA Ao Root diam: 3.10 cm MITRAL VALVE MV Area (PHT): 2.91 cm    SHUNTS MV Decel Time: 261 msec    Systemic VTI:  0.23 m MV E velocity: 75.40 cm/s  Systemic Diam: 2.00 cm MV A velocity: 66.00 cm/s MV E/A ratio:  1.14 Carlyle Dolly MD Electronically signed by Carlyle Dolly MD Signature Date/Time: 09/28/2020/2:52:33 PM    Final

## 2020-10-12 NOTE — Progress Notes (Signed)
Patient states that he does not want to go down for the CT scan today that he just does not think he can do it, ask that I notify the CT department and the Dr.  CT was notified and the Dr was notified.

## 2020-10-12 NOTE — Progress Notes (Addendum)
Patient has been sleeping well overnight. He was on 25 L with 100% of heated high flow. However, when he woke up and was using the BSC, O2 sat was dropped to 70% and dyspnea noted. Bumped up to 35L oxygen with 100% and on nonrebreather mask. Now he is sitting in chair. Klonopin administered and 94% of O2 sat noted. RT notified. Will continue to assess.

## 2020-10-12 NOTE — Progress Notes (Signed)
OT Cancellation Note  Patient Details Name: Matthew [name] MRN: [id number] DOB: 03-28-1966   Cancelled Treatment:    Reason Eval/Treat Not Completed: Pain limiting ability to participate;Fatigue/lethargy limiting ability to participate;Other (comment) Pt reports fatigue and general malaise declining OT session, pt on HHFNC  25% and intermittently using NRB mask. Will check back for OT session.  Lenor Derrick., COTA/L Acute Rehabilitation Services [phone]   [name] 10/12/2020, 2:55 PM

## 2020-10-12 NOTE — Progress Notes (Signed)
Central tele called stated patient had a 9 beat run of vtach, I went immediately and checked on patient, the monitor in room did not show vtach at this time, patient was setting up in chair, just voided into urinal with no complaints at this time, O2 sat 91% on 35L/100% HHFNC and nonrebreather at times, notified Dr via secure message.

## 2020-10-12 NOTE — Progress Notes (Signed)
Dr Jerral Ralph, Sh[place]er responded about vtach just stated okay.

## 2020-10-13 ENCOUNTER — Inpatient Hospital Stay (HOSPITAL_COMMUNITY): Payer: HRSA Program

## 2020-10-13 LAB — CBC
HCT: 38.3 % — ABNORMAL LOW (ref 39.0–52.0)
Hemoglobin: 13.5 g/dL (ref 13.0–17.0)
MCH: 32.1 pg (ref 26.0–34.0)
MCHC: 35.2 g/dL (ref 30.0–36.0)
MCV: 91 fL (ref 80.0–100.0)
Platelets: 222 10*3/uL (ref 150–400)
RBC: 4.21 MIL/uL — ABNORMAL LOW (ref 4.22–5.81)
RDW: 13.3 % (ref 11.5–15.5)
WBC: 16.7 10*3/uL — ABNORMAL HIGH (ref 4.0–10.5)
nRBC: 0 % (ref 0.0–0.2)

## 2020-10-13 LAB — COMPREHENSIVE METABOLIC PANEL
ALT: 55 U/L — ABNORMAL HIGH (ref 0–44)
AST: 21 U/L (ref 15–41)
Albumin: 2.5 g/dL — ABNORMAL LOW (ref 3.5–5.0)
Alkaline Phosphatase: 86 U/L (ref 38–126)
Anion gap: 13 (ref 5–15)
BUN: 28 mg/dL — ABNORMAL HIGH (ref 6–20)
CO2: 25 mmol/L (ref 22–32)
Calcium: 8.4 mg/dL — ABNORMAL LOW (ref 8.9–10.3)
Chloride: 97 mmol/L — ABNORMAL LOW (ref 98–111)
Creatinine, Ser: 1 mg/dL (ref 0.61–1.24)
GFR, Estimated: >60 mL/min (ref >60)
Glucose, Bld: 164 mg/dL — ABNORMAL HIGH (ref 70–99)
Potassium: 4.4 mmol/L (ref 3.5–5.1)
Sodium: 135 mmol/L (ref 135–145)
Total Bilirubin: 0.9 mg/dL (ref 0.3–1.2)
Total Protein: 6.2 g/dL — ABNORMAL LOW (ref 6.5–8.1)

## 2020-10-13 LAB — D-DIMER, QUANTITATIVE: D-Dimer, Quant: 0.5 ug/mL-FEU (ref 0.00–0.50)

## 2020-10-13 LAB — MAGNESIUM: Magnesium: 2.6 mg/dL — ABNORMAL HIGH (ref 1.7–2.4)

## 2020-10-13 LAB — C-REACTIVE PROTEIN: CRP: 6.6 mg/dL — ABNORMAL HIGH (ref <1.0)

## 2020-10-13 MED ORDER — ENOXAPARIN SODIUM 60 MG/0.6ML ~~LOC~~ SOLN
50.0000 mg | Freq: Every day | SUBCUTANEOUS | Status: DC
  Administered 2020-10-14 – 2020-10-18 (×5): 50 mg via SUBCUTANEOUS
  Filled 2020-10-13 (×5): qty 0.6

## 2020-10-13 MED ORDER — FUROSEMIDE 10 MG/ML IJ SOLN
20.0000 mg | Freq: Once | INTRAMUSCULAR | Status: AC
  Administered 2020-10-13: 20 mg via INTRAVENOUS
  Filled 2020-10-13: qty 2

## 2020-10-13 NOTE — Plan of Care (Addendum)
Patient requiring 25L HFNC and NRB at times, desats with exertion, and experiences high anxiety. Clonipin helping per patient. Reported  Pt has poor appetite, not eating hospital food options well.  Per previous RN, family planning on bringing in food for patient.   Will continue to monitor    Problem: Education: Goal: Knowledge of General Education information will improve Description: Including pain rating scale, medication(s)/side effects and non-pharmacologic comfort measures Outcome: Progressing   Problem: Health Behavior/Discharge Planning: Goal: Ability to manage health-related needs will improve Outcome: Progressing   Problem: Clinical Measurements: Goal: Ability to maintain clinical measurements within normal limits will improve Outcome: Progressing Goal: Will remain free from infection Outcome: Progressing Goal: Diagnostic test results will improve Outcome: Progressing Goal: Respiratory complications will improve Outcome: Not Progressing Goal: Cardiovascular complication will be avoided Outcome: Progressing   Problem: Activity: Goal: Risk for activity intolerance will decrease Outcome: Not Progressing   Problem: Nutrition: Goal: Adequate nutrition will be maintained Outcome: Not Progressing   Problem: Coping: Goal: Level of anxiety will decrease Outcome: Not Progressing   Problem: Elimination: Goal: Will not experience complications related to bowel motility Outcome: Progressing Goal: Will not experience complications related to urinary retention Outcome: Progressing

## 2020-10-13 NOTE — Progress Notes (Signed)
   10/13/20 1953  Assess: MEWS Score  Temp 97.8 F (36.6 C)  BP 113/78  Pulse Rate 89  ECG Heart Rate 88  Resp (!) 28  SpO2 95 %  Assess: MEWS Score  MEWS Temp 0  MEWS Systolic 0  MEWS Pulse 0  MEWS RR 2  MEWS LOC 0  MEWS Score 2  MEWS Score Color Yellow  Assess: if the MEWS score is Yellow or Red  Were vital signs taken at a resting state? Yes  Focused Assessment No change from prior assessment  Early Detection of Sepsis Score *See Row Information* Low  MEWS guidelines implemented *See Row Information* Yes  Treat  MEWS Interventions Escalated (See documentation below)  Pain Scale 0-10  Pain Score 0  Take Vital Signs  Increase Vital Sign Frequency  Yellow: Q 2hr X 2 then Q 4hr X 2, if remains yellow, continue Q 4hrs  Escalate  MEWS: Escalate Yellow: discuss with charge nurse/RN and consider discussing with provider and RRT  Notify: Charge Nurse/RN  Name of Charge Nurse/RN Notified Kristen, RN  Date Charge Nurse/RN Notified 10/13/20  Time Charge Nurse/RN Notified 2002  Document  Patient Outcome Stabilized after interventions  Progress note created (see row info) Yes   Patient MEWS turned Yellow, Yellow MEWS protocol initiated.

## 2020-10-13 NOTE — Progress Notes (Signed)
PROGRESS NOTE                                                                                                                                                                                                             Patient Demographics:    Jonithan Medearis, is a 55 y.o. male, DOB - 1966-04-23, ZL:[id number]  Outpatient Primary MD for the patient is Patient, No Pcp Per   Admit date - 09/26/2020   LOS - 17  No chief complaint on file.      Brief Narrative: Patient is a 55 y.o. male with PMHx of  CAD s/p PCI, tobacco use, HLD, GERD-who presented with shortness of breath-found to have acute hypoxic respiratory failure requiring 100% O2 via NRB due to COVID-19 pneumonia.  COVID-19 vaccinated status: Unvaccinated  Significant Events: 12/9>> started having GI symptoms including diarrhea 12/13>> to urgent care-for diarrhea-discharged after supportive care/IV fluids 12/16>> Admit to [facility] for hypoxia due to COVID-19 pneumonia-requiring 100% O2 via NRB 12/28>> hemoptysis-PCCM consulted-supportive care recommended. 12/31>> worsening hypoxemia-started on heated high flow-unable to lie flat for a CTA chest  Significant studies: 12/17>> CTA chest: No PE, diffuse groundglass opacities 12/18>> Echo: EF 60-65% 12/26>> chest x-ray: Diffuse bilateral heterogeneous/interstitial airspace opacities 12/31>> chest x-ray: Stable bilateral lung opacities 12/31>> lower extremity Doppler: No DVT   COVID-19 medications: Steroids: 12/16>> Remdesivir: 12/16>> 12/20 Baricitinib: 12/17>>12/30  Antibiotics: None  Microbiology data: None  Procedures: None  Consults: PCCM  DVT prophylaxis: Lovenox held on 12/28 due to hemoptysis-resumed on 12/13 as no further episodes of hemoptysis    Subjective:   He feels much better than yesterday-anxiety continues to be a big problem for him.  Claims that he does better better with breathing  after lunch.   Assessment  & Plan :   Acute Hypoxic Resp Failure due to Covid 19 Viral pneumonia:  -He is unvaccinated . -continues to have severe hypoxemia-on 12/30-had significant worsening of his hypoxemia (from 8 L at rest to now requiring HHFNC).  Unable to tolerate CTA chest on 12/31 as he was not able to lie flat as he felt "hot"-however lower extremity Dopplers negative, as well D-dimers has normalized today, I doubt VTE, so we will change his Lovenox to DVT prophylaxis dose 0.5 mg/kg daily. -To be euvolemic, on Lasix as needed, will give 20 mg of Lasix today. -  Anxiety continues to play a major role as well-and he remains on Klonopin.  Continue to watch closely-if he deteriorates significantly-he will require transfer to the ICU. -He is treated with steroids, Remdesivir and baricitinib, he remains on low-dose steroids currently.  Note-discussed case with ID MD-Dr. Marcelline Mates improving-decreasing oxygen requirements for the past several days-it appears that he first had symptoms on 12/9.  He is currently more than 21 days out from his date of initial symptoms-and does not require any further isolation-however due to worsening hypoxemia-I have elected to keep him in 5 W.   Fever: afebrile O2 requirements:  SpO2: (!) 87 % O2 Flow Rate (L/min): (S) 15 L/min (15L [place] HFNC) FiO2 (%): 100 %   COVID-19 Labs: Recent Labs    10/11/20 0108 10/11/20 1024 10/12/20 0345 10/13/20 0410  DDIMER  --  1.97* 1.19* 0.50  CRP 3.1*  --  12.2* 6.6*       Component Value Date/Time   BNP 53.8 10/11/2020 1024    Recent Labs  Lab 10/11/20 1024  PROCALCITON <0.10    Lab Results  Component Value Date   SARSCOV2NAA POSITIVE (A) 09/26/2020   SARSCOV2NAA NEGATIVE 02/06/2020   [place] NEGATIVE 11/17/2019    Prone/Incentive Spirometry: encouraged  incentive spirometry use 3-4/hour.  Acute on chronic diastolic heart failure: No evidence of volume overload-1 dose of IV Lasix 20 mg  today to ensure negative balance.  Hemoptysis: Isolated episode that occurred on 12/28-likely related to underlying parenchymal injury from COVID-19 pneumonia.  No further episodes since then.  Continue to monitor closely-if reoccurs-we will reconsult PCCM.  Transaminitis: Due to COVID-19-downtrending-follow periodically  CAD s/p PCI: No anginal symptoms-not on any antiplatelets at home.  Per patient report-PCI was approximately 11 years back  GERD: Continue PPI  Anxiety: Continues to have significant amount of anxiety playing a role in his symptoms-stopping Xanax-starting Klonopin.  If he continues to have symptoms-May benefit from initiation of Lexapro.  Constipation-continue MiraLAX  Obesity: Estimated body mass index is 30.53 kg/m as calculated from the following:   Height as of this encounter: 5\' 11"  (1.803 m).   Weight as of this encounter: 99.3 kg.    GI prophylaxis: PPI  ABG:    Component Value Date/Time   PHART 7.464 (H) 09/26/2020 2035   PCO2ART 31.7 (L) 09/26/2020 2035   PO2ART 90 09/26/2020 2035   HCO3 22.8 09/26/2020 2035   TCO2 24 09/26/2020 2035   ACIDBASEDEF 0.8 11/17/2019 1618   O2SAT 98.0 09/26/2020 2035    Vent Settings: N/A FiO2 (%):  [80 %-100 %] 100 %  Condition - Guarded  Family Communication  :  Sherlyn Lees Kadlec [phone]- updated by phone.  Code Status :  Full Code  Diet :  Diet Order            Diet Heart Room service appropriate? No; Fluid consistency: Thin; Fluid restriction: 2000 mL Fluid  Diet effective now                  Disposition Plan  :   Status is: Inpatient  Remains inpatient appropriate because:Inpatient level of care appropriate due to severity of illness   Dispo: The patient is from: Home              Anticipated d/c is to: Home              Anticipated d/c date is: > 3 days              Patient  currently is not medically stable to d/c.   Barriers to discharge: Hypoxia requiring O2  supplementation  Antimicorbials  :    Anti-infectives (From admission, onward)   Start     Dose/Rate Route Frequency Ordered Stop   09/27/20 1000  remdesivir 100 mg in sodium chloride 0.9 % 100 mL IVPB       "Followed by" Linked Group Details   100 mg 200 mL/hr over 30 Minutes Intravenous Daily 09/26/20 2208 09/30/20 1900   09/26/20 2215  remdesivir 200 mg in sodium chloride 0.9% 250 mL IVPB       "Followed by" Linked Group Details   200 mg 580 mL/hr over 30 Minutes Intravenous Once 09/26/20 2208 09/27/20 0146      Inpatient Medications  Scheduled Meds: . benzonatate  200 mg Oral TID  . [START ON 10/14/2020] enoxaparin (LOVENOX) injection  50 mg Subcutaneous Daily  . escitalopram  5 mg Oral Daily  . feeding supplement  237 mL Oral BID BM  . melatonin  3 mg Oral QHS  . methylPREDNISolone (SOLU-MEDROL) injection  40 mg Intravenous Q12H  . pantoprazole  40 mg Oral Daily  . polyethylene glycol  17 g Oral Daily  . sodium chloride flush  10-40 mL Intracatheter Q12H  . sodium chloride flush  3 mL Intravenous Q12H   Continuous Infusions:  PRN Meds:.acetaminophen, albuterol, bisacodyl, clonazePAM, menthol-cetylpyridinium, ondansetron (ZOFRAN) IV, senna-docusate, sodium chloride, sodium chloride flush   See all Orders from today for further details   Phillips Climes M.D on 10/13/2020 at 11:57 AM  To page go to [email or web address] - use universal password  [facility] -  Office  [phone]    Objective:   Vitals:   10/13/20 0100 10/13/20 0419 10/13/20 0746 10/13/20 0916  BP:  106/71 111/81   Pulse: 74 71 77 82  Resp: (!) 24 (!) 26  (!) 32  Temp:  98 F (36.7 C) 97.9 F (36.6 C)   TempSrc:  Oral    SpO2: 100% 94% 96% (!) 87%  Weight:  99.3 kg    Height:        Wt Readings from Last 3 Encounters:  10/13/20 99.3 kg  12/04/19 108.4 kg  11/20/19 107.9 kg     Intake/Output Summary (Last 24 hours) at 10/13/2020 1157 Last data filed at 10/13/2020 0530 Gross per 24 hour   Intake 240 ml  Output 1025 ml  Net -785 ml     Physical Exam Awake Alert, Oriented X 3, No new F.N deficits, Normal affect Symmetrical Chest wall movement, Good air movement bilaterally,scattered rales. RRR,No Gallops,Rubs or new Murmurs, No Parasternal Heave +ve B.Sounds, Abd Soft, No tenderness, No rebound - guarding or rigidity. No Cyanosis, Clubbing or edema, No new Rash or bruise     Data Review:    CBC Recent Labs  Lab 10/09/20 0112 10/10/20 0156 10/11/20 0108 10/12/20 0345 10/13/20 0410  WBC 18.5* 17.1* 16.8* 16.7* 16.7*  HGB 13.9 14.0 14.2 13.4 13.5  HCT 41.2 39.2 41.7 38.9* 38.3*  PLT 260 250 231 224 222  MCV 90.5 88.7 89.5 91.7 91.0  MCH 30.5 31.7 30.5 31.6 32.1  MCHC 33.7 35.7 34.1 34.4 35.2  RDW 13.1 13.0 13.2 13.3 13.3    Chemistries  Recent Labs  Lab 10/09/20 0112 10/10/20 0156 10/11/20 0108 10/12/20 0345 10/13/20 0410  NA 132* 133* 136 137 135  K 4.5 4.5 3.9 4.7 4.4  CL 99 99 99 101 97*  CO2 25 25 26 26  25  GLUCOSE 166* 193* 120* 166* 164*  BUN 23* 19 23* 24* 28*  CREATININE 1.05 0.97 0.95 1.12 1.00  CALCIUM 8.5* 8.6* 8.6* 8.4* 8.4*  MG  --   --   --   --  2.6*  AST 23 22 20 21 21   ALT 51* 53* 54* 55* 55*  ALKPHOS 84 85 93 84 86  BILITOT 0.9 0.7 0.5 0.8 0.9   ------------------------------------------------------------------------------------------------------------------ No results for input(s): CHOL, HDL, LDLCALC, TRIG, CHOLHDL, LDLDIRECT in the last 72 hours.  No results found for: HGBA1C ------------------------------------------------------------------------------------------------------------------ No results for input(s): TSH, T4TOTAL, T3FREE, THYROIDAB in the last 72 hours.  Invalid input(s): FREET3 ------------------------------------------------------------------------------------------------------------------ No results for input(s): VITAMINB12, FOLATE, FERRITIN, TIBC, IRON, RETICCTPCT in the last 72 hours.  Coagulation  profile No results for input(s): INR, PROTIME in the last 168 hours.  Recent Labs    10/12/20 0345 10/13/20 0410  DDIMER 1.19* 0.50    Cardiac Enzymes No results for input(s): CKMB, TROPONINI, MYOGLOBIN in the last 168 hours.  Invalid input(s): CK ------------------------------------------------------------------------------------------------------------------    Component Value Date/Time   BNP 53.8 10/11/2020 1024    Micro Results No results found for this or any previous visit (from the past 240 hour(s)).  Radiology Reports CT ANGIO CHEST PE W OR WO CONTRAST  Result Date: 09/27/2020 CLINICAL DATA:  55 year old male with history of COVID-19, suspected pulmonary embolism. EXAM: CT ANGIOGRAPHY CHEST WITH CONTRAST TECHNIQUE: Multidetector CT imaging of the chest was performed using the standard protocol during bolus administration of intravenous contrast. Multiplanar CT image reconstructions and MIPs were obtained to evaluate the vascular anatomy. CONTRAST:  100 mL Omnipaque 350, intravenous COMPARISON:  Chest CT from 11/17/2019 FINDINGS: Cardiovascular: Satisfactory opacification of the pulmonary arteries to the segmental level. No evidence of pulmonary embolism. Normal heart size. No pericardial effusion. The heart is normal in size. No pericardial effusion. Severe coronary atherosclerotic calcifications. Mediastinum/Nodes: Similar appearing prominent right paratracheal lymph node measuring up to 1.2 cm in short axis. Otherwise no enlarged mediastinal, hilar, or axillary lymph nodes. Thyroid gland, trachea, and esophagus demonstrate no significant findings. Lungs/Pleura: Interval development of near diffuse, peripherally predominant ground-glass pulmonary opacities with a mid lung and basal predominance. Similar appearing moderate upper lobe predominant paraseptal and centrilobular emphysema. No pleural effusion or pneumothorax. Upper Abdomen: The visualized upper abdomen is within normal  limits. Musculoskeletal: No chest wall abnormality. No acute or significant osseous findings. Review of the MIP images confirms the above findings. IMPRESSION: Vascular: 1. No evidence of pulmonary embolism. 2. Severe coronary atherosclerotic calcifications. Non-Vascular: 1. Diffuse, mid lung and basilar, peripherally predominant ground-glass opacities - findings compatible with multifocal pneumonia associated with COVID-19. 2. Similar appearing moderate upper lobe predominant centrilobular and paraseptal emphysema. Electronically Signed   By: Ruthann Cancer MD   On: 09/27/2020 14:31   DG Chest Port 1 View  Result Date: 10/06/2020 CLINICAL DATA:  COVID positive, pleural effusion, pneumothorax EXAM: PORTABLE CHEST 1 VIEW COMPARISON:  09/26/2020 FINDINGS: Slight interval increase in diffuse bilateral heterogeneous and interstitial airspace opacity, most conspicuous at the lung bases. No significant pleural effusion noted. No pneumothorax. The heart and mediastinum are unremarkable. IMPRESSION: 1. Slight interval increase in diffuse bilateral heterogeneous and interstitial airspace opacity, most conspicuous at the lung bases, consistent with worsened COVID airspace disease. 2.  No significant pleural effusion noted. No pneumothorax. Electronically Signed   By: Eddie Candle M.D.   On: 10/06/2020 09:37   DG CHEST PORT 1 VIEW  Result Date: 09/26/2020 CLINICAL DATA:  COVID-19 EXAM: PORTABLE CHEST 1 VIEW COMPARISON:  09/26/2020 FINDINGS: Slight worsening of bilateral basilar predominant opacities. No pleural effusion or pneumothorax. Normal cardiomediastinal contours. IMPRESSION: Slight worsening of bilateral basilar predominant opacities. Electronically Signed   By: Ulyses Jarred M.D.   On: 09/26/2020 23:26   DG Chest Portable 1 View  Result Date: 09/26/2020 CLINICAL DATA:  Cough and shortness of breath. EXAM: PORTABLE CHEST 1 VIEW COMPARISON:  12/04/2019 FINDINGS: Cardiomegaly which is increased from prior  exam. Pulmonary edema, moderate in degree. There are bilateral pleural effusions. No confluent consolidation. No pneumothorax. No acute osseous abnormalities are seen. IMPRESSION: CHF with cardiomegaly, pulmonary edema and bilateral pleural effusions. Electronically Signed   By: Keith Rake M.D.   On: 09/26/2020 19:14   DG Chest Port 1V same Day  Result Date: 10/13/2020 CLINICAL DATA:  55 year old male with respiratory failure. Recent bronchoscopy and hemoptysis. History of COVID-19. EXAM: PORTABLE CHEST 1 VIEW COMPARISON:  Portable chest 10/11/2020 and earlier. FINDINGS: Portable AP semi upright view at 0554 hours. Lower lung volumes. Stable cardiac size and mediastinal contours. Visualized tracheal air column is within normal limits. Diffuse reticulonodular pulmonary opacity with confluent density in the peripheral right lung and at the lung bases now worse on the right. No pneumothorax. No definite pleural effusion. Stable visualized osseous structures. IMPRESSION: Continued diffuse reticulonodular opacity with lower lung volumes and worsening ventilation since 10/11/2020 at both lung bases. Electronically Signed   By: Genevie Ann M.D.   On: 10/13/2020 07:57   DG Chest Port 1V same Day  Result Date: 10/11/2020 CLINICAL DATA:  Hypoxia.  Shortness of breath. EXAM: PORTABLE CHEST 1 VIEW COMPARISON:  October 06, 2020. FINDINGS: The heart size and mediastinal contours are within normal limits. No pneumothorax or pleural effusion is noted. Stable bilateral lung opacities are noted concerning for multifocal pneumonia. The visualized skeletal structures are unremarkable. IMPRESSION: Stable bilateral lung opacities are noted concerning for multifocal pneumonia. Electronically Signed   By: Marijo Conception M.D.   On: 10/11/2020 10:06   VAS [place] LOWER EXTREMITY VENOUS (DVT)  Result Date: 10/12/2020  Lower Venous DVT Study Indications: Worsening hypoxia, Covid-19.  Comparison Study: No prior studies. Performing  Technologist: Darlin Coco, RDMS  Examination Guidelines: A complete evaluation includes B-mode imaging, spectral Doppler, color Doppler, and power Doppler as needed of all accessible portions of each vessel. Bilateral testing is considered an integral part of a complete examination. Limited examinations for reoccurring indications may be performed as noted. The reflux portion of the exam is performed with the patient in reverse Trendelenburg.  +---------+---------------+---------+-----------+----------+--------------+ RIGHT    CompressibilityPhasicitySpontaneityPropertiesThrombus Aging +---------+---------------+---------+-----------+----------+--------------+ CFV      Full           Yes      Yes                                 +---------+---------------+---------+-----------+----------+--------------+ SFJ      Full                                                        +---------+---------------+---------+-----------+----------+--------------+ FV Prox  Full                                                        +---------+---------------+---------+-----------+----------+--------------+  FV Mid   Full                                                        +---------+---------------+---------+-----------+----------+--------------+ FV DistalFull                                                        +---------+---------------+---------+-----------+----------+--------------+ PFV      Full                                                        +---------+---------------+---------+-----------+----------+--------------+ POP      Full           Yes      Yes                                 +---------+---------------+---------+-----------+----------+--------------+ PTV      Full                                                        +---------+---------------+---------+-----------+----------+--------------+ PERO     Full                                                         +---------+---------------+---------+-----------+----------+--------------+   +---------+---------------+---------+-----------+----------+--------------+ LEFT     CompressibilityPhasicitySpontaneityPropertiesThrombus Aging +---------+---------------+---------+-----------+----------+--------------+ CFV      Full           Yes      Yes                                 +---------+---------------+---------+-----------+----------+--------------+ SFJ      Full                                                        +---------+---------------+---------+-----------+----------+--------------+ FV Prox  Full                                                        +---------+---------------+---------+-----------+----------+--------------+ FV Mid   Full                                                        +---------+---------------+---------+-----------+----------+--------------+  FV DistalFull                                                        +---------+---------------+---------+-----------+----------+--------------+ PFV      Full                                                        +---------+---------------+---------+-----------+----------+--------------+ POP      Full           Yes      Yes                                 +---------+---------------+---------+-----------+----------+--------------+ PTV      Full                                                        +---------+---------------+---------+-----------+----------+--------------+ PERO     Full                                                        +---------+---------------+---------+-----------+----------+--------------+     Summary: RIGHT: - There is no evidence of deep vein thrombosis in the lower extremity.  - No cystic structure found in the popliteal fossa.  LEFT: - There is no evidence of deep vein thrombosis in the lower extremity.  - No cystic structure found in  the popliteal fossa.  *See table(s) above for measurements and observations. Electronically signed by Ruta Hinds MD on 10/12/2020 at 10:00:37 AM.    Final    ECHOCARDIOGRAM LIMITED  Result Date: 09/28/2020    ECHOCARDIOGRAM LIMITED REPORT   Patient Name:   Ozzie Loletha Grayer Ormand Date of Exam: 09/28/2020 Medical Rec #:  [id number]          Height:       71.0 in Accession #:    [id number]         Weight:       244.3 lb Date of Birth:  1966-10-11          BSA:          2.296 m Patient Age:    49 years           BP:           120/84 mmHg Patient Gender: M                  HR:           73 bpm. Exam Location:  Inpatient Procedure: Limited Echo, Limited Color Doppler and Cardiac Doppler Indications:    acute respiratory distress  History:        Patient has prior history of Echocardiogram examinations, most                 recent 03/30/2016. CAD;  Covid.  Sonographer:    Delcie Roch Referring Phys: [id number] Cecille Po MELVIN  Sonographer Comments: Image acquisition challenging due to respiratory motion. IMPRESSIONS  1. Left ventricular ejection fraction, by estimation, is 60 to 65%. The left ventricle has normal function. The left ventricle has no regional wall motion abnormalities. There is mild left ventricular hypertrophy.  2. Right ventricular systolic function is normal. The right ventricular size is normal.  3. The mitral valve is normal in structure. No evidence of mitral valve regurgitation. No evidence of mitral stenosis.  4. The aortic valve is tricuspid.  5. The inferior vena cava is normal in size with greater than 50% respiratory variability, suggesting right atrial pressure of 3 mmHg. FINDINGS  Left Ventricle: Left ventricular ejection fraction, by estimation, is 60 to 65%. The left ventricle has normal function. The left ventricle has no regional wall motion abnormalities. The left ventricular internal cavity size was normal in size. There is  mild left ventricular hypertrophy. Right Ventricle: The  right ventricular size is normal. No increase in right ventricular wall thickness. Right ventricular systolic function is normal. Pericardium: There is no evidence of pericardial effusion. Mitral Valve: The mitral valve is normal in structure. No evidence of mitral valve stenosis. Tricuspid Valve: The tricuspid valve is normal in structure. Tricuspid valve regurgitation is not demonstrated. No evidence of tricuspid stenosis. Aortic Valve: The aortic valve is tricuspid. Pulmonic Valve: The pulmonic valve was not well visualized. Pulmonic valve regurgitation is not visualized. No evidence of pulmonic stenosis. Aorta: The aortic root is normal in size and structure. Pulmonary Artery: Indeterminant PASP, inadequate TR jet. Venous: The inferior vena cava is normal in size with greater than 50% respiratory variability, suggesting right atrial pressure of 3 mmHg. IAS/Shunts: No atrial level shunt detected by color flow Doppler. LEFT VENTRICLE PLAX 2D LVIDd:         4.20 cm LVIDs:         2.80 cm LV PW:         1.30 cm LV IVS:        1.10 cm LVOT diam:     2.00 cm LV SV:         71 LV SV Index:   31 LVOT Area:     3.14 cm  IVC IVC diam: 1.70 cm LEFT ATRIUM         Index LA diam:    4.30 cm 1.87 cm/m  AORTIC VALVE LVOT Vmax:   116.00 cm/s LVOT Vmean:  76.100 cm/s LVOT VTI:    0.227 m  AORTA Ao Root diam: 3.10 cm MITRAL VALVE MV Area (PHT): 2.91 cm    SHUNTS MV Decel Time: 261 msec    Systemic VTI:  0.23 m MV E velocity: 75.40 cm/s  Systemic Diam: 2.00 cm MV A velocity: 66.00 cm/s MV E/A ratio:  1.14 Dina Rich MD Electronically signed by Dina Rich MD Signature Date/Time: 09/28/2020/2:52:33 PM    Final

## 2020-10-14 LAB — COMPREHENSIVE METABOLIC PANEL
ALT: 57 U/L — ABNORMAL HIGH (ref 0–44)
AST: 21 U/L (ref 15–41)
Albumin: 2.5 g/dL — ABNORMAL LOW (ref 3.5–5.0)
Alkaline Phosphatase: 81 U/L (ref 38–126)
Anion gap: 10 (ref 5–15)
BUN: 31 mg/dL — ABNORMAL HIGH (ref 6–20)
CO2: 27 mmol/L (ref 22–32)
Calcium: 8.5 mg/dL — ABNORMAL LOW (ref 8.9–10.3)
Chloride: 99 mmol/L (ref 98–111)
Creatinine, Ser: 1.04 mg/dL (ref 0.61–1.24)
GFR, Estimated: >60 mL/min (ref >60)
Glucose, Bld: 176 mg/dL — ABNORMAL HIGH (ref 70–99)
Potassium: 4.4 mmol/L (ref 3.5–5.1)
Sodium: 136 mmol/L (ref 135–145)
Total Bilirubin: 1 mg/dL (ref 0.3–1.2)
Total Protein: 6.3 g/dL — ABNORMAL LOW (ref 6.5–8.1)

## 2020-10-14 LAB — CBC
HCT: 38.2 % — ABNORMAL LOW (ref 39.0–52.0)
Hemoglobin: 13.3 g/dL (ref 13.0–17.0)
MCH: 31.9 pg (ref 26.0–34.0)
MCHC: 34.8 g/dL (ref 30.0–36.0)
MCV: 91.6 fL (ref 80.0–100.0)
Platelets: 188 10*3/uL (ref 150–400)
RBC: 4.17 MIL/uL — ABNORMAL LOW (ref 4.22–5.81)
RDW: 13.2 % (ref 11.5–15.5)
WBC: 15.5 10*3/uL — ABNORMAL HIGH (ref 4.0–10.5)
nRBC: 0 % (ref 0.0–0.2)

## 2020-10-14 LAB — C-REACTIVE PROTEIN: CRP: 3.2 mg/dL — ABNORMAL HIGH (ref <1.0)

## 2020-10-14 LAB — D-DIMER, QUANTITATIVE: D-Dimer, Quant: 0.87 ug/mL-FEU — ABNORMAL HIGH (ref 0.00–0.50)

## 2020-10-14 NOTE — Progress Notes (Addendum)
OT Cancellation Note  Patient Details Name: Matthew [name] MRN: [id number] DOB: [birth year]   Cancelled Treatment:    Reason Eval/Treat Not Completed: Patient declined, no reason specified; pt seated up in recliner however adamantly declining participating in therapies today given fatigue and increased WOB today. Noted pt with increased O2 needs over last few days. Will follow up for OT treatment as able.  Marcy Siren, OT Acute Rehabilitation Services Pager [phone] Office [phone]    Orlando Penner 10/14/2020, 1:46 PM

## 2020-10-14 NOTE — Progress Notes (Signed)
PROGRESS NOTE                                                                                                                                                                                                             Matthew [name] Demographics:    Matthew [name], is a 55 y.o. male, DOB - June 30, 1966, [id number]  Outpatient Primary MD for the Matthew [name] is Matthew [name], No Pcp Per   Admit date - 09/26/2020   LOS - 18  No chief complaint on file.      Brief Narrative: Matthew [name] is a 55 y.o. male with PMHx of  CAD s/p PCI, tobacco use, HLD, GERD-who presented with shortness of breath-found to have acute hypoxic respiratory failure requiring 100% O2 via NRB due to COVID-19 pneumonia.  COVID-19 vaccinated status: Unvaccinated  Significant Events: 12/9>> started having GI symptoms including diarrhea 12/13>> to urgent care-for diarrhea-discharged after supportive care/IV fluids 12/16>> Admit to [facility] for hypoxia due to COVID-19 pneumonia-requiring 100% O2 via NRB 12/28>> hemoptysis-PCCM consulted-supportive care recommended. 12/31>> worsening hypoxemia-started on heated high flow-unable to lie flat for a CTA chest  Significant studies: 12/17>> CTA chest: No PE, diffuse groundglass opacities 12/18>> Echo: EF 60-65% 12/26>> chest x-ray: Diffuse bilateral heterogeneous/interstitial airspace opacities 12/31>> chest x-ray: Stable bilateral lung opacities 12/31>> lower extremity Doppler: No DVT   COVID-19 medications: Steroids: 12/16>> Remdesivir: 12/16>> 12/20 Baricitinib: 12/17>>12/30  Antibiotics: None  Microbiology data: None  Procedures: None  Consults: PCCM  DVT prophylaxis: Lovenox held on 12/28 due to hemoptysis-resumed on 12/13 as no further episodes of hemoptysis    Subjective:   This morning had prolonged high oxygen requirement after moving from bed to chair, he has some dyspnea then, he did have some  anxiety as well, this all has resolved currently.   Assessment  & Plan :   Acute Hypoxic Resp Failure due to Covid 19 Viral pneumonia:  -He is unvaccinated . -continues to have severe hypoxemia-on 12/30-had significant worsening of his hypoxemia (from 8 L at rest to now requiring HHFNC).  Unable to tolerate CTA chest on 12/31 as he was not able to lie flat as he felt "hot"-however lower extremity Dopplers negative, as well D-dimers has normalized today, I doubt VTE, so we will change his Lovenox to DVT prophylaxis dose 0.5 mg/kg daily. -To be euvolemic, on Lasix as needed, hold on  Lasix today. - Anxiety continues to play a major role as well-and he remains on Klonopin.  Continue to watch closely-if he deteriorates significantly-he will require transfer to the ICU.  This morning with minimal activity he had significant oxygen requirement 60 L heated high flow +15 NRB, but this has significantly improved after rest, he is currently 100% on 40 L heated high flow and 15 NRB, will wean further as tolerated. -He is treated with steroids, Remdesivir and baricitinib, he remains on low-dose steroids currently.  Note-discussed case with ID MD-Dr. Marcelline Mates improving-decreasing oxygen requirements for the past several days-it appears that he first had symptoms on 12/9.  He is currently more than 21 days out from his date of initial symptoms-and does not require any further isolation-however due to worsening hypoxemia-I have elected to keep him in 5 W.   Fever: afebrile O2 requirements:  SpO2: 99 % O2 Flow Rate (L/min): 25 L/min (25L HFNC/15L NRB) FiO2 (%): 60 %   COVID-19 Labs: Recent Labs    10/12/20 0345 10/13/20 0410 10/14/20 0407  DDIMER 1.19* 0.50 0.87*  CRP 12.2* 6.6* 3.2*       Component Value Date/Time   BNP 53.8 10/11/2020 1024    Recent Labs  Lab 10/11/20 1024  PROCALCITON <0.10    Lab Results  Component Value Date   SARSCOV2NAA POSITIVE (A) 09/26/2020   SARSCOV2NAA  NEGATIVE 02/06/2020   [place] NEGATIVE 11/17/2019    Prone/Incentive Spirometry: encouraged  incentive spirometry use 3-4/hour.  Acute on chronic diastolic heart failure: No evidence of volume overload-1 dose of IV Lasix 20 mg today to ensure negative balance.  Hemoptysis: Isolated episode that occurred on 12/28-likely related to underlying parenchymal injury from COVID-19 pneumonia.  No further episodes since then.  Continue to monitor closely-if reoccurs-we will reconsult PCCM.  Transaminitis: Due to COVID-19-downtrending-follow periodically  CAD s/p PCI: No anginal symptoms-not on any antiplatelets at home.  Per Matthew [name] report-PCI was approximately 11 years back  GERD: Continue PPI  Anxiety: Continues to have significant amount of anxiety playing a role in his symptoms-stopping Xanax-starting Klonopin.  If he continues to have symptoms-May benefit from initiation of Lexapro.  Constipation-continue MiraLAX  Obesity: Estimated body mass index is 30.87 kg/m as calculated from the following:   Height as of this encounter: 5\' 11"  (1.803 m).   Weight as of this encounter: 100.4 kg.    GI prophylaxis: PPI  ABG:    Component Value Date/Time   PHART 7.464 (H) 09/26/2020 2035   PCO2ART 31.7 (L) 09/26/2020 2035   PO2ART 90 09/26/2020 2035   HCO3 22.8 09/26/2020 2035   TCO2 24 09/26/2020 2035   ACIDBASEDEF 0.8 11/17/2019 1618   O2SAT 98.0 09/26/2020 2035    Vent Settings: N/A FiO2 (%):  [60 %-100 %] 60 %  Condition - Guarded  Family Communication  :  Matthew [name] [phone]- updated by phone, most recently 10/13/2020.  Code Status :  Full Code  Diet :  Diet Order            Diet Heart Room service appropriate? No; Fluid consistency: Thin; Fluid restriction: 2000 mL Fluid  Diet effective now                  Disposition Plan  :   Status is: Inpatient  Remains inpatient appropriate because:Inpatient level of care appropriate due to severity of  illness   Dispo: The Matthew [name] is from: Home  Anticipated d/c is to: Home              Anticipated d/c date is: > 3 days              Matthew [name] currently is not medically stable to d/c.   Barriers to discharge: Hypoxia requiring O2 supplementation  Antimicorbials  :    Anti-infectives (From admission, onward)   Start     Dose/Rate Route Frequency Ordered Stop   09/27/20 1000  remdesivir 100 mg in sodium chloride 0.9 % 100 mL IVPB       "Followed by" Linked Group Details   100 mg 200 mL/hr over 30 Minutes Intravenous Daily 09/26/20 2208 09/30/20 1900   09/26/20 2215  remdesivir 200 mg in sodium chloride 0.9% 250 mL IVPB       "Followed by" Linked Group Details   200 mg 580 mL/hr over 30 Minutes Intravenous Once 09/26/20 2208 09/27/20 0146      Inpatient Medications  Scheduled Meds: . benzonatate  200 mg Oral TID  . enoxaparin (LOVENOX) injection  50 mg Subcutaneous Daily  . escitalopram  5 mg Oral Daily  . feeding supplement  237 mL Oral BID BM  . melatonin  3 mg Oral QHS  . methylPREDNISolone (SOLU-MEDROL) injection  40 mg Intravenous Q12H  . pantoprazole  40 mg Oral Daily  . polyethylene glycol  17 g Oral Daily  . sodium chloride flush  10-40 mL Intracatheter Q12H  . sodium chloride flush  3 mL Intravenous Q12H   Continuous Infusions:  PRN Meds:.acetaminophen, albuterol, bisacodyl, clonazePAM, menthol-cetylpyridinium, ondansetron (ZOFRAN) IV, senna-docusate, sodium chloride, sodium chloride flush   See all Orders from today for further details   Huey Bienenstock M.D on 10/14/2020 at 2:27 PM  To page go to [email or web address] - use universal password  [facility] -  Office  [phone]    Objective:   Vitals:   10/14/20 0500 10/14/20 0710 10/14/20 0903 10/14/20 1231  BP:  112/73  112/72  Pulse:  69 82 90  Resp:   (!) 26 20  Temp:  (!) 97.5 F (36.4 C)  (!) 97.5 F (36.4 C)  TempSrc:    Oral  SpO2:  100% (!) 84% 99%  Weight: 100.4 kg      Height:        Wt Readings from Last 3 Encounters:  10/14/20 100.4 kg  12/04/19 108.4 kg  11/20/19 107.9 kg     Intake/Output Summary (Last 24 hours) at 10/14/2020 1427 Last data filed at 10/14/2020 1409 Gross per 24 hour  Intake 0 ml  Output 650 ml  Net -650 ml     Physical Exam Awake Alert, Oriented X 3, No new F.N deficits, Normal affect Symmetrical Chest wall movement, Good air movement bilaterally, scattered rales RRR,No Gallops,Rubs or new Murmurs, No Parasternal Heave +ve B.Sounds, Abd Soft, No tenderness, No rebound - guarding or rigidity. No Cyanosis, Clubbing or edema, No new Rash or bruise      Data Review:    CBC Recent Labs  Lab 10/10/20 0156 10/11/20 0108 10/12/20 0345 10/13/20 0410 10/14/20 0407  WBC 17.1* 16.8* 16.7* 16.7* 15.5*  HGB 14.0 14.2 13.4 13.5 13.3  HCT 39.2 41.7 38.9* 38.3* 38.2*  PLT 250 231 224 222 188  MCV 88.7 89.5 91.7 91.0 91.6  MCH 31.7 30.5 31.6 32.1 31.9  MCHC 35.7 34.1 34.4 35.2 34.8  RDW 13.0 13.2 13.3 13.3 13.2    Chemistries  Recent Labs  Lab 10/10/20 0156  10/11/20 0108 10/12/20 0345 10/13/20 0410 10/14/20 0407  NA 133* 136 137 135 136  K 4.5 3.9 4.7 4.4 4.4  CL 99 99 101 97* 99  CO2 25 26 26 25 27   GLUCOSE 193* 120* 166* 164* 176*  BUN 19 23* 24* 28* 31*  CREATININE 0.97 0.95 1.12 1.00 1.04  CALCIUM 8.6* 8.6* 8.4* 8.4* 8.5*  MG  --   --   --  2.6*  --   AST 22 20 21 21 21   ALT 53* 54* 55* 55* 57*  ALKPHOS 85 93 84 86 81  BILITOT 0.7 0.5 0.8 0.9 1.0   ------------------------------------------------------------------------------------------------------------------ No results for input(s): CHOL, HDL, LDLCALC, TRIG, CHOLHDL, LDLDIRECT in the last 72 hours.  No results found for: HGBA1C ------------------------------------------------------------------------------------------------------------------ No results for input(s): TSH, T4TOTAL, T3FREE, THYROIDAB in the last 72 hours.  Invalid input(s):  FREET3 ------------------------------------------------------------------------------------------------------------------ No results for input(s): VITAMINB12, FOLATE, FERRITIN, TIBC, IRON, RETICCTPCT in the last 72 hours.  Coagulation profile No results for input(s): INR, PROTIME in the last 168 hours.  Recent Labs    10/13/20 0410 10/14/20 0407  DDIMER 0.50 0.87*    Cardiac Enzymes No results for input(s): CKMB, TROPONINI, MYOGLOBIN in the last 168 hours.  Invalid input(s): CK ------------------------------------------------------------------------------------------------------------------    Component Value Date/Time   BNP 53.8 10/11/2020 1024    Micro Results No results found for this or any previous visit (from the past 240 hour(s)).  Radiology Reports CT ANGIO CHEST PE W OR WO CONTRAST  Result Date: 09/27/2020 CLINICAL DATA:  55 year old male with history of COVID-19, suspected pulmonary embolism. EXAM: CT ANGIOGRAPHY CHEST WITH CONTRAST TECHNIQUE: Multidetector CT imaging of the chest was performed using the standard protocol during bolus administration of intravenous contrast. Multiplanar CT image reconstructions and MIPs were obtained to evaluate the vascular anatomy. CONTRAST:  100 mL Omnipaque 350, intravenous COMPARISON:  Chest CT from 11/17/2019 FINDINGS: Cardiovascular: Satisfactory opacification of the pulmonary arteries to the segmental level. No evidence of pulmonary embolism. Normal heart size. No pericardial effusion. The heart is normal in size. No pericardial effusion. Severe coronary atherosclerotic calcifications. Mediastinum/Nodes: Similar appearing prominent right paratracheal lymph node measuring up to 1.2 cm in short axis. Otherwise no enlarged mediastinal, hilar, or axillary lymph nodes. Thyroid gland, trachea, and esophagus demonstrate no significant findings. Lungs/Pleura: Interval development of near diffuse, peripherally predominant ground-glass pulmonary  opacities with a mid lung and basal predominance. Similar appearing moderate upper lobe predominant paraseptal and centrilobular emphysema. No pleural effusion or pneumothorax. Upper Abdomen: The visualized upper abdomen is within normal limits. Musculoskeletal: No chest wall abnormality. No acute or significant osseous findings. Review of the MIP images confirms the above findings. IMPRESSION: Vascular: 1. No evidence of pulmonary embolism. 2. Severe coronary atherosclerotic calcifications. Non-Vascular: 1. Diffuse, mid lung and basilar, peripherally predominant ground-glass opacities - findings compatible with multifocal pneumonia associated with COVID-19. 2. Similar appearing moderate upper lobe predominant centrilobular and paraseptal emphysema. Electronically Signed   By: Ruthann Cancer MD   On: 09/27/2020 14:31   DG Chest Port 1 View  Result Date: 10/06/2020 CLINICAL DATA:  COVID positive, pleural effusion, pneumothorax EXAM: PORTABLE CHEST 1 VIEW COMPARISON:  09/26/2020 FINDINGS: Slight interval increase in diffuse bilateral heterogeneous and interstitial airspace opacity, most conspicuous at the lung bases. No significant pleural effusion noted. No pneumothorax. The heart and mediastinum are unremarkable. IMPRESSION: 1. Slight interval increase in diffuse bilateral heterogeneous and interstitial airspace opacity, most conspicuous at the lung bases, consistent with worsened COVID airspace disease.  2.  No significant pleural effusion noted. No pneumothorax. Electronically Signed   By: Eddie Candle M.D.   On: 10/06/2020 09:37   DG CHEST PORT 1 VIEW  Result Date: 09/26/2020 CLINICAL DATA:  COVID-19 EXAM: PORTABLE CHEST 1 VIEW COMPARISON:  09/26/2020 FINDINGS: Slight worsening of bilateral basilar predominant opacities. No pleural effusion or pneumothorax. Normal cardiomediastinal contours. IMPRESSION: Slight worsening of bilateral basilar predominant opacities. Electronically Signed   By: Ulyses Jarred  M.D.   On: 09/26/2020 23:26   DG Chest Portable 1 View  Result Date: 09/26/2020 CLINICAL DATA:  Cough and shortness of breath. EXAM: PORTABLE CHEST 1 VIEW COMPARISON:  12/04/2019 FINDINGS: Cardiomegaly which is increased from prior exam. Pulmonary edema, moderate in degree. There are bilateral pleural effusions. No confluent consolidation. No pneumothorax. No acute osseous abnormalities are seen. IMPRESSION: CHF with cardiomegaly, pulmonary edema and bilateral pleural effusions. Electronically Signed   By: Keith Rake M.D.   On: 09/26/2020 19:14   DG Chest Port 1V same Day  Result Date: 10/13/2020 CLINICAL DATA:  55 year old male with respiratory failure. Recent bronchoscopy and hemoptysis. History of COVID-19. EXAM: PORTABLE CHEST 1 VIEW COMPARISON:  Portable chest 10/11/2020 and earlier. FINDINGS: Portable AP semi upright view at 0554 hours. Lower lung volumes. Stable cardiac size and mediastinal contours. Visualized tracheal air column is within normal limits. Diffuse reticulonodular pulmonary opacity with confluent density in the peripheral right lung and at the lung bases now worse on the right. No pneumothorax. No definite pleural effusion. Stable visualized osseous structures. IMPRESSION: Continued diffuse reticulonodular opacity with lower lung volumes and worsening ventilation since 10/11/2020 at both lung bases. Electronically Signed   By: Genevie Ann M.D.   On: 10/13/2020 07:57   DG Chest Port 1V same Day  Result Date: 10/11/2020 CLINICAL DATA:  Hypoxia.  Shortness of breath. EXAM: PORTABLE CHEST 1 VIEW COMPARISON:  October 06, 2020. FINDINGS: The heart size and mediastinal contours are within normal limits. No pneumothorax or pleural effusion is noted. Stable bilateral lung opacities are noted concerning for multifocal pneumonia. The visualized skeletal structures are unremarkable. IMPRESSION: Stable bilateral lung opacities are noted concerning for multifocal pneumonia. Electronically  Signed   By: Marijo Conception M.D.   On: 10/11/2020 10:06   VAS [place] LOWER EXTREMITY VENOUS (DVT)  Result Date: 10/12/2020  Lower Venous DVT Study Indications: Worsening hypoxia, Covid-19.  Comparison Study: No prior studies. Performing Technologist: Darlin Coco, RDMS  Examination Guidelines: A complete evaluation includes B-mode imaging, spectral Doppler, color Doppler, and power Doppler as needed of all accessible portions of each vessel. Bilateral testing is considered an integral part of a complete examination. Limited examinations for reoccurring indications may be performed as noted. The reflux portion of the exam is performed with the Matthew [name] in reverse Trendelenburg.  +---------+---------------+---------+-----------+----------+--------------+ RIGHT    CompressibilityPhasicitySpontaneityPropertiesThrombus Aging +---------+---------------+---------+-----------+----------+--------------+ CFV      Full           Yes      Yes                                 +---------+---------------+---------+-----------+----------+--------------+ SFJ      Full                                                        +---------+---------------+---------+-----------+----------+--------------+  FV Prox  Full                                                        +---------+---------------+---------+-----------+----------+--------------+ FV Mid   Full                                                        +---------+---------------+---------+-----------+----------+--------------+ FV DistalFull                                                        +---------+---------------+---------+-----------+----------+--------------+ PFV      Full                                                        +---------+---------------+---------+-----------+----------+--------------+ POP      Full           Yes      Yes                                  +---------+---------------+---------+-----------+----------+--------------+ PTV      Full                                                        +---------+---------------+---------+-----------+----------+--------------+ PERO     Full                                                        +---------+---------------+---------+-----------+----------+--------------+   +---------+---------------+---------+-----------+----------+--------------+ LEFT     CompressibilityPhasicitySpontaneityPropertiesThrombus Aging +---------+---------------+---------+-----------+----------+--------------+ CFV      Full           Yes      Yes                                 +---------+---------------+---------+-----------+----------+--------------+ SFJ      Full                                                        +---------+---------------+---------+-----------+----------+--------------+ FV Prox  Full                                                        +---------+---------------+---------+-----------+----------+--------------+  FV Mid   Full                                                        +---------+---------------+---------+-----------+----------+--------------+ FV DistalFull                                                        +---------+---------------+---------+-----------+----------+--------------+ PFV      Full                                                        +---------+---------------+---------+-----------+----------+--------------+ POP      Full           Yes      Yes                                 +---------+---------------+---------+-----------+----------+--------------+ PTV      Full                                                        +---------+---------------+---------+-----------+----------+--------------+ PERO     Full                                                         +---------+---------------+---------+-----------+----------+--------------+     Summary: RIGHT: - There is no evidence of deep vein thrombosis in the lower extremity.  - No cystic structure found in the popliteal fossa.  LEFT: - There is no evidence of deep vein thrombosis in the lower extremity.  - No cystic structure found in the popliteal fossa.  *See table(s) above for measurements and observations. Electronically signed by Ruta Hinds MD on 10/12/2020 at 10:00:37 AM.    Final    ECHOCARDIOGRAM LIMITED  Result Date: 09/28/2020    ECHOCARDIOGRAM LIMITED REPORT   Matthew [name] Name:   Alic Loletha Grayer Oddo Date of Exam: 09/28/2020 Medical Rec #:  [id number]          Height:       71.0 in Accession #:    [id number]         Weight:       244.3 lb Date of Birth:  06-29-1966          BSA:          2.296 m Matthew [name] Age:    22 years           BP:           120/84 mmHg Matthew [name] Gender: M                  HR:  73 bpm. Exam Location:  Inpatient Procedure: Limited Echo, Limited Color Doppler and Cardiac Doppler Indications:    acute respiratory distress  History:        Matthew [name] has prior history of Echocardiogram examinations, most                 recent 03/30/2016. CAD; Covid.  Sonographer:    Johny Chess Referring Phys: [id number] Candace Gallus MELVIN  Sonographer Comments: Image acquisition challenging due to respiratory motion. IMPRESSIONS  1. Left ventricular ejection fraction, by estimation, is 60 to 65%. The left ventricle has normal function. The left ventricle has no regional wall motion abnormalities. There is mild left ventricular hypertrophy.  2. Right ventricular systolic function is normal. The right ventricular size is normal.  3. The mitral valve is normal in structure. No evidence of mitral valve regurgitation. No evidence of mitral stenosis.  4. The aortic valve is tricuspid.  5. The inferior vena cava is normal in size with greater than 50% respiratory variability, suggesting right atrial pressure of 3  mmHg. FINDINGS  Left Ventricle: Left ventricular ejection fraction, by estimation, is 60 to 65%. The left ventricle has normal function. The left ventricle has no regional wall motion abnormalities. The left ventricular internal cavity size was normal in size. There is  mild left ventricular hypertrophy. Right Ventricle: The right ventricular size is normal. No increase in right ventricular wall thickness. Right ventricular systolic function is normal. Pericardium: There is no evidence of pericardial effusion. Mitral Valve: The mitral valve is normal in structure. No evidence of mitral valve stenosis. Tricuspid Valve: The tricuspid valve is normal in structure. Tricuspid valve regurgitation is not demonstrated. No evidence of tricuspid stenosis. Aortic Valve: The aortic valve is tricuspid. Pulmonic Valve: The pulmonic valve was not well visualized. Pulmonic valve regurgitation is not visualized. No evidence of pulmonic stenosis. Aorta: The aortic root is normal in size and structure. Pulmonary Artery: Indeterminant PASP, inadequate TR jet. Venous: The inferior vena cava is normal in size with greater than 50% respiratory variability, suggesting right atrial pressure of 3 mmHg. IAS/Shunts: No atrial level shunt detected by color flow Doppler. LEFT VENTRICLE PLAX 2D LVIDd:         4.20 cm LVIDs:         2.80 cm LV PW:         1.30 cm LV IVS:        1.10 cm LVOT diam:     2.00 cm LV SV:         71 LV SV Index:   31 LVOT Area:     3.14 cm  IVC IVC diam: 1.70 cm LEFT ATRIUM         Index LA diam:    4.30 cm 1.87 cm/m  AORTIC VALVE LVOT Vmax:   116.00 cm/s LVOT Vmean:  76.100 cm/s LVOT VTI:    0.227 m  AORTA Ao Root diam: 3.10 cm MITRAL VALVE MV Area (PHT): 2.91 cm    SHUNTS MV Decel Time: 261 msec    Systemic VTI:  0.23 m MV E velocity: 75.40 cm/s  Systemic Diam: 2.00 cm MV A velocity: 66.00 cm/s MV E/A ratio:  1.14 Carlyle Dolly MD Electronically signed by Carlyle Dolly MD Signature Date/Time: 09/28/2020/2:52:33  PM    Final

## 2020-10-14 NOTE — Plan of Care (Signed)

## 2020-10-15 LAB — COMPREHENSIVE METABOLIC PANEL
ALT: 81 U/L — ABNORMAL HIGH (ref 0–44)
AST: 30 U/L (ref 15–41)
Albumin: 2.5 g/dL — ABNORMAL LOW (ref 3.5–5.0)
Alkaline Phosphatase: 75 U/L (ref 38–126)
Anion gap: 10 (ref 5–15)
BUN: 27 mg/dL — ABNORMAL HIGH (ref 6–20)
CO2: 27 mmol/L (ref 22–32)
Calcium: 8.6 mg/dL — ABNORMAL LOW (ref 8.9–10.3)
Chloride: 99 mmol/L (ref 98–111)
Creatinine, Ser: 1.06 mg/dL (ref 0.61–1.24)
GFR, Estimated: >60 mL/min (ref >60)
Glucose, Bld: 166 mg/dL — ABNORMAL HIGH (ref 70–99)
Potassium: 4.6 mmol/L (ref 3.5–5.1)
Sodium: 136 mmol/L (ref 135–145)
Total Bilirubin: 1 mg/dL (ref 0.3–1.2)
Total Protein: 6.3 g/dL — ABNORMAL LOW (ref 6.5–8.1)

## 2020-10-15 LAB — CBC
HCT: 38.3 % — ABNORMAL LOW (ref 39.0–52.0)
Hemoglobin: 13 g/dL (ref 13.0–17.0)
MCH: 31.5 pg (ref 26.0–34.0)
MCHC: 33.9 g/dL (ref 30.0–36.0)
MCV: 92.7 fL (ref 80.0–100.0)
Platelets: 169 10*3/uL (ref 150–400)
RBC: 4.13 MIL/uL — ABNORMAL LOW (ref 4.22–5.81)
RDW: 13.2 % (ref 11.5–15.5)
WBC: 15.9 10*3/uL — ABNORMAL HIGH (ref 4.0–10.5)
nRBC: 0 % (ref 0.0–0.2)

## 2020-10-15 LAB — C-REACTIVE PROTEIN: CRP: 2.8 mg/dL — ABNORMAL HIGH (ref <1.0)

## 2020-10-15 NOTE — Progress Notes (Addendum)
PROGRESS NOTE                                                                                                                                                                                                             Patient Demographics:    Matthew [name], is a 55 y.o. male, DOB - 16-Dec-1965, [id number]  Outpatient Primary MD for the patient is Patient, No Pcp Per   Admit date - 09/26/2020   LOS - 19  No chief complaint on file.      Brief Narrative: Patient is a 55 y.o. male with PMHx of  CAD s/p PCI, tobacco use, HLD, GERD-who presented with shortness of breath-found to have acute hypoxic respiratory failure requiring 100% O2 via NRB due to COVID-19 pneumonia.  COVID-19 vaccinated status: Unvaccinated  Significant Events: 12/9>> started having GI symptoms including diarrhea 12/13>> to urgent care-for diarrhea-discharged after supportive care/IV fluids 12/16>> Admit to [facility] for hypoxia due to COVID-19 pneumonia-requiring 100% O2 via NRB 12/28>> hemoptysis-PCCM consulted-supportive care recommended. 12/31>> worsening hypoxemia-started on heated high flow-unable to lie flat for a CTA chest  Significant studies: 12/17>> CTA chest: No PE, diffuse groundglass opacities 12/18>> Echo: EF 60-65% 12/26>> chest x-ray: Diffuse bilateral heterogeneous/interstitial airspace opacities 12/31>> chest x-ray: Stable bilateral lung opacities 12/31>> lower extremity Doppler: No DVT   COVID-19 medications: Steroids: 12/16>> Remdesivir: 12/16>> 12/20 Baricitinib: 12/17>>12/30  Antibiotics: None  Microbiology data: None  Procedures: None  Consults: PCCM  DVT prophylaxis: Lovenox held on 12/28 due to hemoptysis-resumed on 12/13 as no further episodes of hemoptysis    Subjective:   This morning had prolonged high oxygen requirement after moving from bed to chair, he has some dyspnea then, he did have some  anxiety as well, this all has resolved currently.   Assessment  & Plan :   Acute Hypoxic Resp Failure due to Covid 19 Viral pneumonia:  -He is unvaccinated . -continues to have severe hypoxemia-on 12/30-had significant worsening of his hypoxemia (from 8 L at rest to now requiring HHFNC).  Unable to tolerate CTA chest on 12/31 as he was not able to lie flat as he felt "hot"-however lower extremity Dopplers negative, as well D-dimers has normalized today, I doubt VTE, so I have changed  his Lovenox to DVT prophylaxis dose 0.5 mg/kg daily. -To be euvolemic, on Lasix as needed, hold  on Lasix today. - Anxiety continues to play a major role as well-and he remains on Klonopin.  Continue to watch closely-if he deteriorates significantly-he will require transfer to the ICU.  -He remains on 30 L heated high flow plus/minus NRB, which he has been having for last few days, but usually he does require significant oxygen increase after moving from bed to chair, and giving Klonopin before that has been seem to help a lot as anxiety has been abusing to his tachypnea as well. -He is treated with steroids, Remdesivir and baricitinib, he remains on low-dose steroids currently.  Note-Previous discussed case with ID MD-Dr. Marcelline Mates improving-decreasing oxygen requirements for the past several days-it appears that he first had symptoms on 12/9.  He is currently more than 21 days out from his date of initial symptoms-and does not require any further isolation-however due to worsening hypoxemia-I have elected to keep him in 5 W.   Fever: afebrile O2 requirements:  SpO2: 93 % O2 Flow Rate (L/min): 30 L/min FiO2 (%): 80 %   COVID-19 Labs: Recent Labs    10/13/20 0410 10/14/20 0407 10/15/20 0420  DDIMER 0.50 0.87*  --   CRP 6.6* 3.2* 2.8*       Component Value Date/Time   BNP 53.8 10/11/2020 1024    Recent Labs  Lab 10/11/20 1024  PROCALCITON <0.10    Lab Results  Component Value Date    SARSCOV2NAA POSITIVE (A) 09/26/2020   SARSCOV2NAA NEGATIVE 02/06/2020   [place] NEGATIVE 11/17/2019    Prone/Incentive Spirometry: encouraged  incentive spirometry use 3-4/hour.  Acute on chronic diastolic heart failure: No evidence of volume overload-on Lasix as needed to ensure negative balance Hemoptysis: Isolated episode that occurred on 12/28-likely related to underlying parenchymal injury from COVID-19 pneumonia.  No further episodes since then.  Continue to monitor closely-if reoccurs-we will reconsult PCCM.  Transaminitis: Due to COVID-19-downtrending-follow periodically  CAD s/p PCI: No anginal symptoms-not on any antiplatelets at home.  Per patient report-PCI was approximately 11 years back  GERD: Continue PPI  Anxiety: Continues to have significant amount of anxiety playing a role in his symptoms-stopping Xanax-starting Klonopin.  If he continues to have symptoms-May benefit from initiation of Lexapro.  Constipation-continue MiraLAX  Obesity: Estimated body mass index is 30.87 kg/m as calculated from the following:   Height as of this encounter: 5\' 11"  (1.803 m).   Weight as of this encounter: 100.4 kg.    GI prophylaxis: PPI  ABG:    Component Value Date/Time   PHART 7.464 (H) 09/26/2020 2035   PCO2ART 31.7 (L) 09/26/2020 2035   PO2ART 90 09/26/2020 2035   HCO3 22.8 09/26/2020 2035   TCO2 24 09/26/2020 2035   ACIDBASEDEF 0.8 11/17/2019 1618   O2SAT 98.0 09/26/2020 2035    Vent Settings: N/A FiO2 (%):  [80 %-100 %] 80 %  Condition - Guarded  Family Communication  :  Sherlyn Lees [place] [phone]- updated by phone, most recently 10/15/2020.  Code Status :  Full Code  Diet :  Diet Order            Diet Heart Room service appropriate? No; Fluid consistency: Thin; Fluid restriction: 2000 mL Fluid  Diet effective now                  Disposition Plan  :   Status is: Inpatient  Remains inpatient appropriate because:Inpatient level of care  appropriate due to severity of illness   Dispo: The patient is from: Home  Anticipated d/c is to: Home              Anticipated d/c date is: > 3 days              Patient currently is not medically stable to d/c.   Barriers to discharge: Hypoxia requiring O2 supplementation  Antimicorbials  :    Anti-infectives (From admission, onward)   Start     Dose/Rate Route Frequency Ordered Stop   09/27/20 1000  remdesivir 100 mg in sodium chloride 0.9 % 100 mL IVPB       "Followed by" Linked Group Details   100 mg 200 mL/hr over 30 Minutes Intravenous Daily 09/26/20 2208 09/30/20 1900   09/26/20 2215  remdesivir 200 mg in sodium chloride 0.9% 250 mL IVPB       "Followed by" Linked Group Details   200 mg 580 mL/hr over 30 Minutes Intravenous Once 09/26/20 2208 09/27/20 0146      Inpatient Medications  Scheduled Meds: . benzonatate  200 mg Oral TID  . enoxaparin (LOVENOX) injection  50 mg Subcutaneous Daily  . escitalopram  5 mg Oral Daily  . feeding supplement  237 mL Oral BID BM  . melatonin  3 mg Oral QHS  . methylPREDNISolone (SOLU-MEDROL) injection  40 mg Intravenous Q12H  . pantoprazole  40 mg Oral Daily  . polyethylene glycol  17 g Oral Daily  . sodium chloride flush  10-40 mL Intracatheter Q12H  . sodium chloride flush  3 mL Intravenous Q12H   Continuous Infusions:  PRN Meds:.acetaminophen, albuterol, bisacodyl, clonazePAM, menthol-cetylpyridinium, ondansetron (ZOFRAN) IV, senna-docusate, sodium chloride, sodium chloride flush   See all Orders from today for further details   Phillips Climes M.D on 10/15/2020 at 12:22 PM  To page go to [email or web address] - use universal password  [facility] -  Office  [phone]    Objective:   Vitals:   10/15/20 0830 10/15/20 0909 10/15/20 1118 10/15/20 1128  BP: 120/77     Pulse: 72 85    Resp: (!) 28 (!) 28    Temp: 97.9 F (36.6 C)     TempSrc: Oral     SpO2: 90% (!) 89% 95% 93%  Weight:      Height:         Wt Readings from Last 3 Encounters:  10/14/20 100.4 kg  12/04/19 108.4 kg  11/20/19 107.9 kg     Intake/Output Summary (Last 24 hours) at 10/15/2020 1222 Last data filed at 10/15/2020 1028 Gross per 24 hour  Intake 0 ml  Output 500 ml  Net -500 ml     Physical Exam  Awake Alert, Oriented X 3, No new F.N deficits, Normal affect Symmetrical Chest wall movement, Good air movement bilaterally, scattered rales RRR,No Gallops,Rubs or new Murmurs, No Parasternal Heave +ve B.Sounds, Abd Soft, No tenderness, No rebound - guarding or rigidity. No Cyanosis, Clubbing or edema, No new Rash or bruise       Data Review:    CBC Recent Labs  Lab 10/11/20 0108 10/12/20 0345 10/13/20 0410 10/14/20 0407 10/15/20 0420  WBC 16.8* 16.7* 16.7* 15.5* 15.9*  HGB 14.2 13.4 13.5 13.3 13.0  HCT 41.7 38.9* 38.3* 38.2* 38.3*  PLT 231 224 222 188 169  MCV 89.5 91.7 91.0 91.6 92.7  MCH 30.5 31.6 32.1 31.9 31.5  MCHC 34.1 34.4 35.2 34.8 33.9  RDW 13.2 13.3 13.3 13.2 13.2    Chemistries  Recent Labs  Lab 10/11/20 0108 10/12/20 0345 10/13/20  0410 10/14/20 0407 10/15/20 0420  NA 136 137 135 136 136  K 3.9 4.7 4.4 4.4 4.6  CL 99 101 97* 99 99  CO2 26 26 25 27 27   GLUCOSE 120* 166* 164* 176* 166*  BUN 23* 24* 28* 31* 27*  CREATININE 0.95 1.12 1.00 1.04 1.06  CALCIUM 8.6* 8.4* 8.4* 8.5* 8.6*  MG  --   --  2.6*  --   --   AST 20 21 21 21 30   ALT 54* 55* 55* 57* 81*  ALKPHOS 93 84 86 81 75  BILITOT 0.5 0.8 0.9 1.0 1.0   ------------------------------------------------------------------------------------------------------------------ No results for input(s): CHOL, HDL, LDLCALC, TRIG, CHOLHDL, LDLDIRECT in the last 72 hours.  No results found for: HGBA1C ------------------------------------------------------------------------------------------------------------------ No results for input(s): TSH, T4TOTAL, T3FREE, THYROIDAB in the last 72 hours.  Invalid input(s):  FREET3 ------------------------------------------------------------------------------------------------------------------ No results for input(s): VITAMINB12, FOLATE, FERRITIN, TIBC, IRON, RETICCTPCT in the last 72 hours.  Coagulation profile No results for input(s): INR, PROTIME in the last 168 hours.  Recent Labs    10/13/20 0410 10/14/20 0407  DDIMER 0.50 0.87*    Cardiac Enzymes No results for input(s): CKMB, TROPONINI, MYOGLOBIN in the last 168 hours.  Invalid input(s): CK ------------------------------------------------------------------------------------------------------------------    Component Value Date/Time   BNP 53.8 10/11/2020 1024    Micro Results No results found for this or any previous visit (from the past 240 hour(s)).  Radiology Reports CT ANGIO CHEST PE W OR WO CONTRAST  Result Date: 09/27/2020 CLINICAL DATA:  55 year old male with history of COVID-19, suspected pulmonary embolism. EXAM: CT ANGIOGRAPHY CHEST WITH CONTRAST TECHNIQUE: Multidetector CT imaging of the chest was performed using the standard protocol during bolus administration of intravenous contrast. Multiplanar CT image reconstructions and MIPs were obtained to evaluate the vascular anatomy. CONTRAST:  100 mL Omnipaque 350, intravenous COMPARISON:  Chest CT from 11/17/2019 FINDINGS: Cardiovascular: Satisfactory opacification of the pulmonary arteries to the segmental level. No evidence of pulmonary embolism. Normal heart size. No pericardial effusion. The heart is normal in size. No pericardial effusion. Severe coronary atherosclerotic calcifications. Mediastinum/Nodes: Similar appearing prominent right paratracheal lymph node measuring up to 1.2 cm in short axis. Otherwise no enlarged mediastinal, hilar, or axillary lymph nodes. Thyroid gland, trachea, and esophagus demonstrate no significant findings. Lungs/Pleura: Interval development of near diffuse, peripherally predominant ground-glass pulmonary  opacities with a mid lung and basal predominance. Similar appearing moderate upper lobe predominant paraseptal and centrilobular emphysema. No pleural effusion or pneumothorax. Upper Abdomen: The visualized upper abdomen is within normal limits. Musculoskeletal: No chest wall abnormality. No acute or significant osseous findings. Review of the MIP images confirms the above findings. IMPRESSION: Vascular: 1. No evidence of pulmonary embolism. 2. Severe coronary atherosclerotic calcifications. Non-Vascular: 1. Diffuse, mid lung and basilar, peripherally predominant ground-glass opacities - findings compatible with multifocal pneumonia associated with COVID-19. 2. Similar appearing moderate upper lobe predominant centrilobular and paraseptal emphysema. Electronically Signed   By: Ruthann Cancer MD   On: 09/27/2020 14:31   DG Chest Port 1 View  Result Date: 10/06/2020 CLINICAL DATA:  COVID positive, pleural effusion, pneumothorax EXAM: PORTABLE CHEST 1 VIEW COMPARISON:  09/26/2020 FINDINGS: Slight interval increase in diffuse bilateral heterogeneous and interstitial airspace opacity, most conspicuous at the lung bases. No significant pleural effusion noted. No pneumothorax. The heart and mediastinum are unremarkable. IMPRESSION: 1. Slight interval increase in diffuse bilateral heterogeneous and interstitial airspace opacity, most conspicuous at the lung bases, consistent with worsened COVID airspace disease. 2.  No  significant pleural effusion noted. No pneumothorax. Electronically Signed   By: Lauralyn Primes M.D.   On: 10/06/2020 09:37   DG CHEST PORT 1 VIEW  Result Date: 09/26/2020 CLINICAL DATA:  COVID-19 EXAM: PORTABLE CHEST 1 VIEW COMPARISON:  09/26/2020 FINDINGS: Slight worsening of bilateral basilar predominant opacities. No pleural effusion or pneumothorax. Normal cardiomediastinal contours. IMPRESSION: Slight worsening of bilateral basilar predominant opacities. Electronically Signed   By: Deatra Robinson  M.D.   On: 09/26/2020 23:26   DG Chest Portable 1 View  Result Date: 09/26/2020 CLINICAL DATA:  Cough and shortness of breath. EXAM: PORTABLE CHEST 1 VIEW COMPARISON:  12/04/2019 FINDINGS: Cardiomegaly which is increased from prior exam. Pulmonary edema, moderate in degree. There are bilateral pleural effusions. No confluent consolidation. No pneumothorax. No acute osseous abnormalities are seen. IMPRESSION: CHF with cardiomegaly, pulmonary edema and bilateral pleural effusions. Electronically Signed   By: Narda Rutherford M.D.   On: 09/26/2020 19:14   DG Chest Port 1V same Day  Result Date: 10/13/2020 CLINICAL DATA:  55 year old male with respiratory failure. Recent bronchoscopy and hemoptysis. History of COVID-19. EXAM: PORTABLE CHEST 1 VIEW COMPARISON:  Portable chest 10/11/2020 and earlier. FINDINGS: Portable AP semi upright view at 0554 hours. Lower lung volumes. Stable cardiac size and mediastinal contours. Visualized tracheal air column is within normal limits. Diffuse reticulonodular pulmonary opacity with confluent density in the peripheral right lung and at the lung bases now worse on the right. No pneumothorax. No definite pleural effusion. Stable visualized osseous structures. IMPRESSION: Continued diffuse reticulonodular opacity with lower lung volumes and worsening ventilation since 10/11/2020 at both lung bases. Electronically Signed   By: Odessa Fleming M.D.   On: 10/13/2020 07:57   DG Chest Port 1V same Day  Result Date: 10/11/2020 CLINICAL DATA:  Hypoxia.  Shortness of breath. EXAM: PORTABLE CHEST 1 VIEW COMPARISON:  October 06, 2020. FINDINGS: The heart size and mediastinal contours are within normal limits. No pneumothorax or pleural effusion is noted. Stable bilateral lung opacities are noted concerning for multifocal pneumonia. The visualized skeletal structures are unremarkable. IMPRESSION: Stable bilateral lung opacities are noted concerning for multifocal pneumonia. Electronically  Signed   By: Lupita Raider M.D.   On: 10/11/2020 10:06   VAS [place] LOWER EXTREMITY VENOUS (DVT)  Result Date: 10/12/2020  Lower Venous DVT Study Indications: Worsening hypoxia, Covid-19.  Comparison Study: No prior studies. Performing Technologist: Jean Rosenthal, RDMS  Examination Guidelines: A complete evaluation includes B-mode imaging, spectral Doppler, color Doppler, and power Doppler as needed of all accessible portions of each vessel. Bilateral testing is considered an integral part of a complete examination. Limited examinations for reoccurring indications may be performed as noted. The reflux portion of the exam is performed with the patient in reverse Trendelenburg.  +---------+---------------+---------+-----------+----------+--------------+ RIGHT    CompressibilityPhasicitySpontaneityPropertiesThrombus Aging +---------+---------------+---------+-----------+----------+--------------+ CFV      Full           Yes      Yes                                 +---------+---------------+---------+-----------+----------+--------------+ SFJ      Full                                                        +---------+---------------+---------+-----------+----------+--------------+  FV Prox  Full                                                        +---------+---------------+---------+-----------+----------+--------------+ FV Mid   Full                                                        +---------+---------------+---------+-----------+----------+--------------+ FV DistalFull                                                        +---------+---------------+---------+-----------+----------+--------------+ PFV      Full                                                        +---------+---------------+---------+-----------+----------+--------------+ POP      Full           Yes      Yes                                  +---------+---------------+---------+-----------+----------+--------------+ PTV      Full                                                        +---------+---------------+---------+-----------+----------+--------------+ PERO     Full                                                        +---------+---------------+---------+-----------+----------+--------------+   +---------+---------------+---------+-----------+----------+--------------+ LEFT     CompressibilityPhasicitySpontaneityPropertiesThrombus Aging +---------+---------------+---------+-----------+----------+--------------+ CFV      Full           Yes      Yes                                 +---------+---------------+---------+-----------+----------+--------------+ SFJ      Full                                                        +---------+---------------+---------+-----------+----------+--------------+ FV Prox  Full                                                        +---------+---------------+---------+-----------+----------+--------------+  FV Mid   Full                                                        +---------+---------------+---------+-----------+----------+--------------+ FV DistalFull                                                        +---------+---------------+---------+-----------+----------+--------------+ PFV      Full                                                        +---------+---------------+---------+-----------+----------+--------------+ POP      Full           Yes      Yes                                 +---------+---------------+---------+-----------+----------+--------------+ PTV      Full                                                        +---------+---------------+---------+-----------+----------+--------------+ PERO     Full                                                         +---------+---------------+---------+-----------+----------+--------------+     Summary: RIGHT: - There is no evidence of deep vein thrombosis in the lower extremity.  - No cystic structure found in the popliteal fossa.  LEFT: - There is no evidence of deep vein thrombosis in the lower extremity.  - No cystic structure found in the popliteal fossa.  *See table(s) above for measurements and observations. Electronically signed by Ruta Hinds MD on 10/12/2020 at 10:00:37 AM.    Final    ECHOCARDIOGRAM LIMITED  Result Date: 09/28/2020    ECHOCARDIOGRAM LIMITED REPORT   Patient Name:   Keeghan Loletha Grayer Mengel Date of Exam: 09/28/2020 Medical Rec #:  [id number]          Height:       71.0 in Accession #:    [id number]         Weight:       244.3 lb Date of Birth:  Jan 11, 1966          BSA:          2.296 m Patient Age:    53 years           BP:           120/84 mmHg Patient Gender: M                  HR:  73 bpm. Exam Location:  Inpatient Procedure: Limited Echo, Limited Color Doppler and Cardiac Doppler Indications:    acute respiratory distress  History:        Patient has prior history of Echocardiogram examinations, most                 recent 03/30/2016. CAD; Covid.  Sonographer:    Johny Chess Referring Phys: [id number] Candace Gallus MELVIN  Sonographer Comments: Image acquisition challenging due to respiratory motion. IMPRESSIONS  1. Left ventricular ejection fraction, by estimation, is 60 to 65%. The left ventricle has normal function. The left ventricle has no regional wall motion abnormalities. There is mild left ventricular hypertrophy.  2. Right ventricular systolic function is normal. The right ventricular size is normal.  3. The mitral valve is normal in structure. No evidence of mitral valve regurgitation. No evidence of mitral stenosis.  4. The aortic valve is tricuspid.  5. The inferior vena cava is normal in size with greater than 50% respiratory variability, suggesting right atrial pressure of 3  mmHg. FINDINGS  Left Ventricle: Left ventricular ejection fraction, by estimation, is 60 to 65%. The left ventricle has normal function. The left ventricle has no regional wall motion abnormalities. The left ventricular internal cavity size was normal in size. There is  mild left ventricular hypertrophy. Right Ventricle: The right ventricular size is normal. No increase in right ventricular wall thickness. Right ventricular systolic function is normal. Pericardium: There is no evidence of pericardial effusion. Mitral Valve: The mitral valve is normal in structure. No evidence of mitral valve stenosis. Tricuspid Valve: The tricuspid valve is normal in structure. Tricuspid valve regurgitation is not demonstrated. No evidence of tricuspid stenosis. Aortic Valve: The aortic valve is tricuspid. Pulmonic Valve: The pulmonic valve was not well visualized. Pulmonic valve regurgitation is not visualized. No evidence of pulmonic stenosis. Aorta: The aortic root is normal in size and structure. Pulmonary Artery: Indeterminant PASP, inadequate TR jet. Venous: The inferior vena cava is normal in size with greater than 50% respiratory variability, suggesting right atrial pressure of 3 mmHg. IAS/Shunts: No atrial level shunt detected by color flow Doppler. LEFT VENTRICLE PLAX 2D LVIDd:         4.20 cm LVIDs:         2.80 cm LV PW:         1.30 cm LV IVS:        1.10 cm LVOT diam:     2.00 cm LV SV:         71 LV SV Index:   31 LVOT Area:     3.14 cm  IVC IVC diam: 1.70 cm LEFT ATRIUM         Index LA diam:    4.30 cm 1.87 cm/m  AORTIC VALVE LVOT Vmax:   116.00 cm/s LVOT Vmean:  76.100 cm/s LVOT VTI:    0.227 m  AORTA Ao Root diam: 3.10 cm MITRAL VALVE MV Area (PHT): 2.91 cm    SHUNTS MV Decel Time: 261 msec    Systemic VTI:  0.23 m MV E velocity: 75.40 cm/s  Systemic Diam: 2.00 cm MV A velocity: 66.00 cm/s MV E/A ratio:  1.14 Carlyle Dolly MD Electronically signed by Carlyle Dolly MD Signature Date/Time: 09/28/2020/2:52:33  PM    Final

## 2020-10-15 NOTE — Progress Notes (Signed)
   10/14/20 2000  Assess: MEWS Score  Temp (!) 97.4 F (36.3 C)  BP 114/79  Pulse Rate 82  ECG Heart Rate 82  Resp (!) 26  Level of Consciousness Alert  SpO2 92 %  O2 Device HFNC  Patient Activity (if Appropriate) In bed  O2 Flow Rate (L/min) 30 L/min  FiO2 (%) 100 %  Assess: MEWS Score  MEWS Temp 0  MEWS Systolic 0  MEWS Pulse 0  MEWS RR 2  MEWS LOC 0  MEWS Score 2  MEWS Score Color Yellow  Assess: if the MEWS score is Yellow or Red  Were vital signs taken at a resting state? Yes  Focused Assessment No change from prior assessment  Early Detection of Sepsis Score *See Row Information* Low  MEWS guidelines implemented *See Row Information* Yes  Treat  MEWS Interventions Escalated (See documentation below)  Pain Scale 0-10  Pain Score 0  Complains of Shortness of breath  Interventions Reposition;Relaxation  Take Vital Signs  Increase Vital Sign Frequency  Yellow: Q 2hr X 2 then Q 4hr X 2, if remains yellow, continue Q 4hrs  Escalate  MEWS: Escalate Yellow: discuss with charge nurse/RN and consider discussing with provider and RRT  Notify: Charge Nurse/RN  Name of Charge Nurse/RN Notified Anna RN  Date Charge Nurse/RN Notified 10/14/20  Time Charge Nurse/RN Notified 2000   Patient MEWS turned yellow; Yellow MEWS protocol initiated.

## 2020-10-15 NOTE — Plan of Care (Signed)
  Problem: Education: Goal: Knowledge of General Education information will improve Description: Including pain rating scale, medication(s)/side effects and non-pharmacologic comfort measures Outcome: Progressing   Problem: Health Behavior/Discharge Planning: Goal: Ability to manage health-related needs will improve Outcome: Progressing   Problem: Clinical Measurements: Goal: Ability to maintain clinical measurements within normal limits will improve Outcome: Progressing Goal: Will remain free from infection Outcome: Progressing Goal: Diagnostic test results will improve Outcome: Progressing Goal: Respiratory complications will improve Outcome: Progressing Goal: Cardiovascular complication will be avoided Outcome: Progressing   Problem: Activity: Goal: Risk for activity intolerance will decrease Outcome: Progressing   Problem: Nutrition: Goal: Adequate nutrition will be maintained Outcome: Progressing   Problem: Elimination: Goal: Will not experience complications related to bowel motility Outcome: Progressing Goal: Will not experience complications related to urinary retention Outcome: Progressing   Problem: Pain Managment: Goal: General experience of comfort will improve Outcome: Progressing   Problem: Safety: Goal: Ability to remain free from injury will improve Outcome: Progressing   Problem: Skin Integrity: Goal: Risk for impaired skin integrity will decrease Outcome: Progressing   Problem: Education: Goal: Knowledge of risk factors and measures for prevention of condition will improve Outcome: Progressing   Problem: Respiratory: Goal: Will maintain a patent airway Outcome: Progressing Goal: Complications related to the disease process, condition or treatment will be avoided or minimized Outcome: Progressing   Problem: Coping: Goal: Level of anxiety will decrease Outcome: Not Progressing   Problem: Coping: Goal: Psychosocial and spiritual needs will  be supported Outcome: Not Progressing

## 2020-10-15 NOTE — Progress Notes (Addendum)
Physical Therapy Treatment Patient Details Name: Matthew [name] MRN: [id number] DOB: 1966-05-07 Today's Date: 10/15/2020    History of Present Illness Pt is a 55 y.o. male admitted 09/26/20 with SOB and GI distress (had declined COVID test when seen in urgent care). Pt now testing (+) COVID-19. Workup for acute hypoxic respiratory failure due to acute COVID-19 viral PNA. Course complicated by hemoptysis, worsening hypoxemis requiring HHFNC; pt unable to lay flat for chest CTA. Negative DVT on 12/31. PMH includes CAD, CHF, tobacco use (quit 11/2019); pt unvaccinated.   PT Comments    Pt with minimal mobility progression this session. Pt reports, "My main limiting factor is my anxiety at this point." Performed seated edge of chair activity, including deep breathing exercise, LE therex and anxiety reduction strategies; tolerates very short bouts of activity then needs prolonged time to recover. Pt endorses wanting to participate as much as possible, but is "scared" about his oxygen. Max encouragement and education on importance of mobility; pt receptive. Will continue to follow acutely.  HR 86; SpO2 down to 82% on 50L O2 HHFNC at 80% FiO2, pt using 15L NRB to recover to >/90%    Follow Up Recommendations  Home health PT     Equipment Recommendations  None recommended by PT    Recommendations for Other Services       Precautions / Restrictions Precautions Precautions: Fall;Other (comment) Precaution Comments: Significant anxiety; 45L O2 HHFNC at 80% FiO2, intermittent use of NRB at rest/with activity Restrictions Weight Bearing Restrictions: No    Mobility  Bed Mobility               General bed mobility comments: Seated in recliner with legs elevated  Transfers                 General transfer comment: Pt declined; agreeable to sitting at edge of recliner activity with max encouragement; reports "scared" and "I'm limited by my anxiety"  Ambulation/Gait                  Stairs             Wheelchair Mobility    Modified Rankin (Stroke Patients Only)       Balance Overall balance assessment: Needs assistance   Sitting balance-Leahy Scale: Good Sitting balance - Comments: Bouts of seated edge of chair activity (without back support) for deep breathing exercises                                    Cognition Arousal/Alertness: Awake/alert Behavior During Therapy: Anxious Overall Cognitive Status: Within Functional Limits for tasks assessed                                 General Comments: Pt self-aware of significant anxiety. Initially declines mobility secondary to feeling "scared" and "anxious" - even says, "My main limiting factor is my anxiety"      Exercises Other Exercises Other Exercises: Seated edge of chair (no back support) activity - upright posture (pt tends to flex forward significantly) to expand lungs, pursed lip breathing, deep breathing -- pt tolerates ~30-sec of activity before leaning back in chair; SpO2 down to 82% on 50L O2 HHFNC with pt replacing 15L NRB Other Exercises: Reviewed seated LE therex    General Comments        Pertinent Vitals/Pain  Pain Assessment: No/denies pain Pain Intervention(s): Monitored during session    Home Living                      Prior Function            PT Goals (current goals can now be found in the care plan section) Acute Rehab PT Goals Patient Stated Goal: to walk out of here PT Goal Formulation: With patient Time For Goal Achievement: 10/29/20 Potential to Achieve Goals: Fair Progress towards PT goals: Not progressing toward goals - comment (limited by anxiety)    Frequency    Min 3X/week      PT Plan Current plan remains appropriate    Co-evaluation              AM-PAC PT "6 Clicks" Mobility   Outcome Measure  Help needed turning from your back to your side while in a flat bed without using  bedrails?: None Help needed moving from lying on your back to sitting on the side of a flat bed without using bedrails?: None Help needed moving to and from a bed to a chair (including a wheelchair)?: A Little Help needed standing up from a chair using your arms (e.g., wheelchair or bedside chair)?: A Little Help needed to walk in hospital room?: A Little Help needed climbing 3-5 steps with a railing? : A Little 6 Click Score: 20    End of Session Equipment Utilized During Treatment: Oxygen Activity Tolerance: Other (comment) (patient limited by anxiety) Patient left: in chair;with call bell/phone within reach Nurse Communication: Mobility status PT Visit Diagnosis: Other abnormalities of gait and mobility (R26.89)     Time: [id number] PT Time Calculation (min) (ACUTE ONLY): 24 min  Charges:  $Therapeutic Exercise: 8-22 mins $Therapeutic Activity: 8-22 mins                     Mabeline Caras, PT, DPT Acute Rehabilitation Services  Pager [phone] Office [place] 10/15/2020, 3:07 PM

## 2020-10-16 ENCOUNTER — Inpatient Hospital Stay (HOSPITAL_COMMUNITY): Payer: HRSA Program

## 2020-10-16 LAB — C-REACTIVE PROTEIN: CRP: 3.6 mg/dL — ABNORMAL HIGH (ref <1.0)

## 2020-10-16 LAB — COMPREHENSIVE METABOLIC PANEL
ALT: 115 U/L — ABNORMAL HIGH (ref 0–44)
AST: 32 U/L (ref 15–41)
Albumin: 2.4 g/dL — ABNORMAL LOW (ref 3.5–5.0)
Alkaline Phosphatase: 83 U/L (ref 38–126)
Anion gap: 8 (ref 5–15)
BUN: 24 mg/dL — ABNORMAL HIGH (ref 6–20)
CO2: 26 mmol/L (ref 22–32)
Calcium: 8.3 mg/dL — ABNORMAL LOW (ref 8.9–10.3)
Chloride: 101 mmol/L (ref 98–111)
Creatinine, Ser: 0.85 mg/dL (ref 0.61–1.24)
GFR, Estimated: >60 mL/min (ref >60)
Glucose, Bld: 174 mg/dL — ABNORMAL HIGH (ref 70–99)
Potassium: 4.5 mmol/L (ref 3.5–5.1)
Sodium: 135 mmol/L (ref 135–145)
Total Bilirubin: 0.8 mg/dL (ref 0.3–1.2)
Total Protein: 6.2 g/dL — ABNORMAL LOW (ref 6.5–8.1)

## 2020-10-16 LAB — CBC
HCT: 37.6 % — ABNORMAL LOW (ref 39.0–52.0)
Hemoglobin: 13.1 g/dL (ref 13.0–17.0)
MCH: 32 pg (ref 26.0–34.0)
MCHC: 34.8 g/dL (ref 30.0–36.0)
MCV: 91.7 fL (ref 80.0–100.0)
Platelets: 150 10*3/uL (ref 150–400)
RBC: 4.1 MIL/uL — ABNORMAL LOW (ref 4.22–5.81)
RDW: 13.2 % (ref 11.5–15.5)
WBC: 15.7 10*3/uL — ABNORMAL HIGH (ref 4.0–10.5)
nRBC: 0 % (ref 0.0–0.2)

## 2020-10-16 LAB — D-DIMER, QUANTITATIVE: D-Dimer, Quant: 0.95 ug/mL-FEU — ABNORMAL HIGH (ref 0.00–0.50)

## 2020-10-16 MED ORDER — FUROSEMIDE 10 MG/ML IJ SOLN
40.0000 mg | Freq: Once | INTRAMUSCULAR | Status: AC
  Administered 2020-10-16: 40 mg via INTRAVENOUS
  Filled 2020-10-16: qty 4

## 2020-10-16 MED ORDER — CLONAZEPAM 0.5 MG PO TABS
0.5000 mg | ORAL_TABLET | Freq: Once | ORAL | Status: AC
  Administered 2020-10-16: 0.5 mg via ORAL
  Filled 2020-10-16: qty 1

## 2020-10-16 MED ORDER — ESCITALOPRAM OXALATE 10 MG PO TABS
10.0000 mg | ORAL_TABLET | Freq: Every day | ORAL | Status: DC
  Administered 2020-10-17 – 2020-10-20 (×4): 10 mg via ORAL
  Filled 2020-10-16 (×4): qty 1

## 2020-10-16 NOTE — Plan of Care (Signed)

## 2020-10-16 NOTE — Progress Notes (Addendum)
Occupational Therapy Treatment Patient Details Name: Matthew [name] MRN: [id number] DOB: 05/16/1966 Today's Date: 10/16/2020    History of present illness Pt is a 55 y.o. male admitted 09/26/20 with SOB and GI distress (had declined COVID test when seen in urgent care). Pt now testing (+) COVID-19. Workup for acute hypoxic respiratory failure due to acute COVID-19 viral PNA. Course complicated by hemoptysis, worsening hypoxemis requiring HHFNC; pt unable to lay flat for chest CTA. Negative DVT on 12/31. PMH includes CAD, CHF, tobacco use (quit 11/2019); pt unvaccinated.   OT comments  Pt making very slow progress towards OT goals. He continues to have limitations mostly due to increased anxiousness over breathing/respiratory status (pt self-aware and reporting to OT feeling this way). Pt had been up to chair and already back to bed upon arrival to room. He requires coaxing and education on need to participate in activity but is ultimately agreeable to minimal participation. Focus on breathing techniques, relaxation strategies and gentle ROM to bil UE/LE. Pt aware of techniques/strategies but will benefit from continued carryover during functional task as pt is agreeable to doing so. Pt does verbalize "I'm going to make it out of here", "I'm going to be able to walk out of here" during session. SpO2 maintaining in the upper 90s-100% on HHFNC (80% FiO2, 30L) and NRB. Have updated current d/c recommendations to [facility], however this will largely depend on pt's progress. Will continue to follow while acutely admitted.   Follow Up Recommendations  Home health OT;Supervision/Assistance - 24 hour (but will largely depend on progress)    Equipment Recommendations  Tub/shower seat          Precautions / Restrictions Precautions Precautions: Fall;Other (comment) Restrictions Weight Bearing Restrictions: No       Mobility Bed Mobility               General bed mobility comments: not  attempted, pt in sidelying upon arrival and not willing to move/change position  Transfers                 General transfer comment: not attempted        ADL either performed or assessed with clinical judgement   ADL Overall ADL's : Needs assistance/impaired                                       General ADL Comments: focus on gentle ROM activities, deep breathing exercises and discussion of relaxation techniques during session                       Cognition Arousal/Alertness: Awake/alert Behavior During Therapy: Anxious Overall Cognitive Status: Within Functional Limits for tasks assessed                                 General Comments: Pt self-aware of significant anxiety. Initially declines mobility secondary to feeling "scared" and "anxious" - even says, "My main limiting factor is my anxiety". initially trying to postpone therapy to tomorrow, with education provided on needing to participate in some activity daily in order to progress        Exercises Exercises: General Upper Extremity;General Lower Extremity;Other exercises General Exercises - Upper Extremity Digit Composite Flexion: AROM;Both;10 reps Composite Extension: AROM;Both;10 reps General Exercises - Lower Extremity Ankle Circles/Pumps: AROM;Both;10 reps Other Exercises Other Exercises:  practicing slow, deep breathing techniques. x10 reps (x2 sets) Other Exercises: reviewed relaxation strategies to reduce feelings of anxiety Other Exercises: attempted to have pt perform flutter valve, pt refused   Shoulder Instructions       General Comments      Pertinent Vitals/ Pain       Pain Assessment: No/denies pain  Home Living                                          Prior Functioning/Environment              Frequency  Min 3X/week        Progress Toward Goals  OT Goals(current goals can now be found in the care plan section)   Progress towards OT goals: Progressing toward goals  Acute Rehab OT Goals Patient Stated Goal: to walk out of here OT Goal Formulation: With patient Time For Goal Achievement: 10/30/20 Potential to Achieve Goals: Good ADL Goals Pt Will Perform Lower Body Bathing: with modified independence;sit to/from stand Pt Will Perform Lower Body Dressing: with modified independence;sit to/from stand Pt Will Transfer to Toilet: with modified independence;ambulating Pt/caregiver will Perform Home Exercise Program: Increased strength;Both right and left upper extremity;With written HEP provided;Independently;With theraband Additional ADL Goal #1: Pt will independently verbalize 3 energy conservation strategies Additional ADL Goal #2: Pt will maintain SpO2 greater than 88 during functional activity Additional ADL Goal #3: Pt will demonstrate use of 2 anxiety managment strategies with min vc to help with mobility and ADL tasks  Plan Discharge plan needs to be updated    Co-evaluation                 AM-PAC OT "6 Clicks" Daily Activity     Outcome Measure   Help from another person eating meals?: None Help from another person taking care of personal grooming?: A Little Help from another person toileting, which includes using toliet, bedpan, or urinal?: A Little Help from another person bathing (including washing, rinsing, drying)?: A Little Help from another person to put on and taking off regular upper body clothing?: A Little Help from another person to put on and taking off regular lower body clothing?: A Little 6 Click Score: 19    End of Session Equipment Utilized During Treatment: Oxygen  OT Visit Diagnosis: Unsteadiness on feet (R26.81);Other abnormalities of gait and mobility (R26.89);Muscle weakness (generalized) (M62.81);Dizziness and giddiness (R42)   Activity Tolerance Patient limited by fatigue   Patient Left in bed;with call bell/phone within reach   Nurse Communication  Mobility status        Time: 0272-5366 OT Time Calculation (min): 16 min  Charges: OT General Charges $OT Visit: 1 Visit OT Treatments $Therapeutic Activity: 8-22 mins  Marcy Siren, OT Acute Rehabilitation Services Pager [phone] Office [phone]    Matthew [name] 10/16/2020, 5:19 PM

## 2020-10-16 NOTE — Progress Notes (Signed)
PROGRESS NOTE                                                                                                                                                                                                             Patient Demographics:    Matthew [name], is a 55 y.o. male, DOB - October 11, 1966, [id number]  Outpatient Primary MD for the patient is Patient, No Pcp Per   Admit date - 09/26/2020   LOS - 20  No chief complaint on file.      Brief Narrative: Patient is a 55 y.o. male with PMHx of  CAD s/p PCI, tobacco use, HLD, GERD-who presented with shortness of breath-found to have acute hypoxic respiratory failure requiring 100% O2 via NRB due to COVID-19 pneumonia.  COVID-19 vaccinated status: Unvaccinated  Significant Events: 12/9>> started having GI symptoms including diarrhea 12/13>> to urgent care-for diarrhea-discharged after supportive care/IV fluids 12/16>> Admit to [facility] for hypoxia due to COVID-19 pneumonia-requiring 100% O2 via NRB 12/28>> hemoptysis-PCCM consulted-supportive care recommended. 12/31>> worsening hypoxemia-started on heated high flow-unable to lie flat for a CTA chest  Significant studies: 12/17>> CTA chest: No PE, diffuse groundglass opacities 12/18>> Echo: EF 60-65% 12/26>> chest x-ray: Diffuse bilateral heterogeneous/interstitial airspace opacities 12/31>> chest x-ray: Stable bilateral lung opacities 12/31>> lower extremity Doppler: No DVT 1/5>> chest x-ray: Mild improvement in multifocal opacities   COVID-19 medications: Steroids: 12/16>> Remdesivir: 12/16>> 12/20 Baricitinib: 12/17>>12/30  Antibiotics: None  Microbiology data: None  Procedures: None  Consults: PCCM  DVT prophylaxis: Lovenox     Subjective:   Remains with severe hypoxemia-on heated high flow-remains hypoxic.   Assessment  & Plan :   Acute Hypoxic Resp Failure due to Covid 19 Viral pneumonia:  Continues to have severe hypoxemia-requiring heated high flow and NRB intermittently.  Continue IV steroids-we will give 1 dose of IV Lasix today as he has some lower extremity edema-keep in negative balance-continue supportive care.  Anxiety continues to play a significant role in his symptoms.  Low suspicion for VTE-recent lower extremity Dopplers were negative-patient was unable to lie flat for CTA chest.  Since low suspicion-further work-up not pursued-on prophylactic anticoagulation at this point.  Note-discussed case with ID MD-Dr. Marcelline [name] improving-decreasing oxygen requirements for the past several days-it appears that he first had symptoms on 12/9.  He is currently more than 21 days  out from his date of initial symptoms-and does not require any further isolation-however due to worsening hypoxemia-I have elected to keep him in 5 W.   Fever: afebrile O2 requirements:  SpO2: 94 % O2 Flow Rate (L/min): 30 L/min FiO2 (%): 80 %   COVID-19 Labs: Recent Labs    10/14/20 0407 10/15/20 0420 10/16/20 0315  DDIMER 0.87*  --  0.95*  CRP 3.2* 2.8* 3.6*       Component Value Date/Time   BNP 53.8 10/11/2020 1024    Recent Labs  Lab 10/11/20 1024  PROCALCITON <0.10    Lab Results  Component Value Date   SARSCOV2NAA POSITIVE (A) 09/26/2020   SARSCOV2NAA NEGATIVE 02/06/2020   Matthew [name] NEGATIVE 11/17/2019    Prone/Incentive Spirometry: encouraged  incentive spirometry use 3-4/hour.  Acute on chronic diastolic heart failure: No evidence of volume overload-1 dose of IV Lasix today to ensure negative balance.  Hemoptysis: Isolated episode that occurred on 12/28-likely related to underlying parenchymal injury from COVID-19 pneumonia.  No further episodes since then.  Continue to monitor closely-if reoccurs-we will reconsult PCCM.  Transaminitis: Due to COVID-19-downtrending-follow periodically  CAD s/p PCI: No anginal symptoms-not on any antiplatelets at home.  Per patient  report-PCI was approximately 11 years back  GERD: Continue PPI  Anxiety: Continues to have significant amount of anxiety-increase Lexapro to 10 mg-continue prn Klonopin.  Constipation-continue MiraLAX  Obesity: Estimated body mass index is 30.87 kg/m as calculated from the following:   Height as of this encounter: 5\' 11"  (1.803 m).   Weight as of this encounter: 100.4 kg.    GI prophylaxis: PPI  ABG:    Component Value Date/Time   PHART 7.464 (H) 09/26/2020 2035   PCO2ART 31.7 (L) 09/26/2020 2035   PO2ART 90 09/26/2020 2035   HCO3 22.8 09/26/2020 2035   TCO2 24 09/26/2020 2035   ACIDBASEDEF 0.8 11/17/2019 1618   O2SAT 98.0 09/26/2020 2035    Vent Settings: N/A FiO2 (%):  [80 %] 80 %  Condition - Guarded  Family Communication  :  Spouse-Matthew [name] [phone]- on 1/5  Code Status :  Full Code  Diet :  Diet Order            Diet Heart Room service appropriate? No; Fluid consistency: Thin; Fluid restriction: 2000 mL Fluid  Diet effective now                  Disposition Plan  :   Status is: Inpatient  Remains inpatient appropriate because:Inpatient level of care appropriate due to severity of illness   Dispo: The patient is from: Home              Anticipated d/c is to: Home              Anticipated d/c date is: > 3 days              Patient currently is not medically stable to d/c.   Barriers to discharge: Hypoxia requiring O2 supplementation  Antimicorbials  :    Anti-infectives (From admission, onward)   Start     Dose/Rate Route Frequency Ordered Stop   09/27/20 1000  remdesivir 100 mg in sodium chloride 0.9 % 100 mL IVPB       "Followed by" Linked Group Details   100 mg 200 mL/hr over 30 Minutes Intravenous Daily 09/26/20 2208 09/30/20 1900   09/26/20 2215  remdesivir 200 mg in sodium chloride 0.9% 250 mL IVPB       "  Followed by" Linked Group Details   200 mg 580 mL/hr over 30 Minutes Intravenous Once 09/26/20 2208 09/27/20 0146       Inpatient Medications  Scheduled Meds: . benzonatate  200 mg Oral TID  . enoxaparin (LOVENOX) injection  50 mg Subcutaneous Daily  . [START ON 10/17/2020] escitalopram  10 mg Oral Daily  . feeding supplement  237 mL Oral BID BM  . melatonin  3 mg Oral QHS  . methylPREDNISolone (SOLU-MEDROL) injection  40 mg Intravenous Q12H  . pantoprazole  40 mg Oral Daily  . polyethylene glycol  17 g Oral Daily  . sodium chloride flush  10-40 mL Intracatheter Q12H  . sodium chloride flush  3 mL Intravenous Q12H   Continuous Infusions:  PRN Meds:.acetaminophen, albuterol, bisacodyl, clonazePAM, menthol-cetylpyridinium, ondansetron (ZOFRAN) IV, senna-docusate, sodium chloride, sodium chloride flush   Time Spent in minutes  35   See all Orders from today for further details   Oren Binet M.D on 10/16/2020 at 3:07 PM  To page go to [email or web address] - use universal password  [facility] -  Office  [phone]    Objective:   Vitals:   10/16/20 0944 10/16/20 0949 10/16/20 1215 10/16/20 1322  BP:   112/73   Pulse:   96   Resp: (!) 22  20 (!) 22  Temp:   97.8 F (36.6 C)   TempSrc:   Axillary   SpO2:  98% 96% 94%  Weight:      Height:        Wt Readings from Last 3 Encounters:  10/14/20 100.4 kg  12/04/19 108.4 kg  11/20/19 107.9 kg     Intake/Output Summary (Last 24 hours) at 10/16/2020 1507 Last data filed at 10/16/2020 1100 Gross per 24 hour  Intake -  Output 500 ml  Net -500 ml     Physical Exam Gen Exam:Alert awake-not in any distress HEENT:atraumatic, normocephalic Chest: B/L clear to auscultation anteriorly CVS:S1S2 regular Abdomen:soft non tender, non distended Extremities:no edema Neurology: Non focal Skin: no rash   Data Review:    CBC Recent Labs  Lab 10/12/20 0345 10/13/20 0410 10/14/20 0407 10/15/20 0420 10/16/20 0315  WBC 16.7* 16.7* 15.5* 15.9* 15.7*  HGB 13.4 13.5 13.3 13.0 13.1  HCT 38.9* 38.3* 38.2* 38.3* 37.6*  PLT 224 222 188  169 150  MCV 91.7 91.0 91.6 92.7 91.7  MCH 31.6 32.1 31.9 31.5 32.0  MCHC 34.4 35.2 34.8 33.9 34.8  RDW 13.3 13.3 13.2 13.2 13.2    Chemistries  Recent Labs  Lab 10/12/20 0345 10/13/20 0410 10/14/20 0407 10/15/20 0420 10/16/20 0315  NA 137 135 136 136 135  K 4.7 4.4 4.4 4.6 4.5  CL 101 97* 99 99 101  CO2 26 25 27 27 26   GLUCOSE 166* 164* 176* 166* 174*  BUN 24* 28* 31* 27* 24*  CREATININE 1.12 1.00 1.04 1.06 0.85  CALCIUM 8.4* 8.4* 8.5* 8.6* 8.3*  MG  --  2.6*  --   --   --   AST 21 21 21 30  32  ALT 55* 55* 57* 81* 115*  ALKPHOS 84 86 81 75 83  BILITOT 0.8 0.9 1.0 1.0 0.8   ------------------------------------------------------------------------------------------------------------------ No results for input(s): CHOL, HDL, LDLCALC, TRIG, CHOLHDL, LDLDIRECT in the last 72 hours.  No results found for: HGBA1C ------------------------------------------------------------------------------------------------------------------ No results for input(s): TSH, T4TOTAL, T3FREE, THYROIDAB in the last 72 hours.  Invalid input(s): FREET3 ------------------------------------------------------------------------------------------------------------------ No results for input(s): VITAMINB12, FOLATE, FERRITIN, TIBC, IRON, RETICCTPCT in  the last 72 hours.  Coagulation profile No results for input(s): INR, PROTIME in the last 168 hours.  Recent Labs    10/14/20 0407 10/16/20 0315  DDIMER 0.87* 0.95*    Cardiac Enzymes No results for input(s): CKMB, TROPONINI, MYOGLOBIN in the last 168 hours.  Invalid input(s): CK ------------------------------------------------------------------------------------------------------------------    Component Value Date/Time   BNP 53.8 10/11/2020 1024    Micro Results No results found for this or any previous visit (from the past 240 hour(s)).  Radiology Reports CT ANGIO CHEST PE W OR WO CONTRAST  Result Date: 09/27/2020 CLINICAL DATA:   55 year old male with history of COVID-19, suspected pulmonary embolism. EXAM: CT ANGIOGRAPHY CHEST WITH CONTRAST TECHNIQUE: Multidetector CT imaging of the chest was performed using the standard protocol during bolus administration of intravenous contrast. Multiplanar CT image reconstructions and MIPs were obtained to evaluate the vascular anatomy. CONTRAST:  100 mL Omnipaque 350, intravenous COMPARISON:  Chest CT from 11/17/2019 FINDINGS: Cardiovascular: Satisfactory opacification of the pulmonary arteries to the segmental level. No evidence of pulmonary embolism. Normal heart size. No pericardial effusion. The heart is normal in size. No pericardial effusion. Severe coronary atherosclerotic calcifications. Mediastinum/Nodes: Similar appearing prominent right paratracheal lymph node measuring up to 1.2 cm in short axis. Otherwise no enlarged mediastinal, hilar, or axillary lymph nodes. Thyroid gland, trachea, and esophagus demonstrate no significant findings. Lungs/Pleura: Interval development of near diffuse, peripherally predominant ground-glass pulmonary opacities with a mid lung and basal predominance. Similar appearing moderate upper lobe predominant paraseptal and centrilobular emphysema. No pleural effusion or pneumothorax. Upper Abdomen: The visualized upper abdomen is within normal limits. Musculoskeletal: No chest wall abnormality. No acute or significant osseous findings. Review of the MIP images confirms the above findings. IMPRESSION: Vascular: 1. No evidence of pulmonary embolism. 2. Severe coronary atherosclerotic calcifications. Non-Vascular: 1. Diffuse, mid lung and basilar, peripherally predominant ground-glass opacities - findings compatible with multifocal pneumonia associated with COVID-19. 2. Similar appearing moderate upper lobe predominant centrilobular and paraseptal emphysema. Electronically Signed   By: Ruthann Cancer MD   On: 09/27/2020 14:31   DG Chest Port 1 View  Result Date:  10/06/2020 CLINICAL DATA:  COVID positive, pleural effusion, pneumothorax EXAM: PORTABLE CHEST 1 VIEW COMPARISON:  09/26/2020 FINDINGS: Slight interval increase in diffuse bilateral heterogeneous and interstitial airspace opacity, most conspicuous at the lung bases. No significant pleural effusion noted. No pneumothorax. The heart and mediastinum are unremarkable. IMPRESSION: 1. Slight interval increase in diffuse bilateral heterogeneous and interstitial airspace opacity, most conspicuous at the lung bases, consistent with worsened COVID airspace disease. 2.  No significant pleural effusion noted. No pneumothorax. Electronically Signed   By: Eddie Candle M.D.   On: 10/06/2020 09:37   DG CHEST PORT 1 VIEW  Result Date: 09/26/2020 CLINICAL DATA:  COVID-19 EXAM: PORTABLE CHEST 1 VIEW COMPARISON:  09/26/2020 FINDINGS: Slight worsening of bilateral basilar predominant opacities. No pleural effusion or pneumothorax. Normal cardiomediastinal contours. IMPRESSION: Slight worsening of bilateral basilar predominant opacities. Electronically Signed   By: Ulyses Jarred M.D.   On: 09/26/2020 23:26   DG Chest Portable 1 View  Result Date: 09/26/2020 CLINICAL DATA:  Cough and shortness of breath. EXAM: PORTABLE CHEST 1 VIEW COMPARISON:  12/04/2019 FINDINGS: Cardiomegaly which is increased from prior exam. Pulmonary edema, moderate in degree. There are bilateral pleural effusions. No confluent consolidation. No pneumothorax. No acute osseous abnormalities are seen. IMPRESSION: CHF with cardiomegaly, pulmonary edema and bilateral pleural effusions. Electronically Signed   By: Aurther Loft.D.  On: 09/26/2020 19:14   DG Chest Port 1V same Day  Result Date: 10/16/2020 CLINICAL DATA:  COVID positive, short of breath EXAM: PORTABLE CHEST 1 VIEW COMPARISON:  None. FINDINGS: Normal cardiac silhouette. Bilateral peripheral fine airspace disease appears slightly less dense than comparison exam. No pneumothorax. No focal  consolidation. IMPRESSION: Mild improvement in bilateral multifocal viral pneumonia. Electronically Signed   By: Suzy Bouchard M.D.   On: 10/16/2020 13:32   DG Chest Port 1V same Day  Result Date: 10/13/2020 CLINICAL DATA:  55 year old male with respiratory failure. Recent bronchoscopy and hemoptysis. History of COVID-19. EXAM: PORTABLE CHEST 1 VIEW COMPARISON:  Portable chest 10/11/2020 and earlier. FINDINGS: Portable AP semi upright view at 0554 hours. Lower lung volumes. Stable cardiac size and mediastinal contours. Visualized tracheal air column is within normal limits. Diffuse reticulonodular pulmonary opacity with confluent density in the peripheral right lung and at the lung bases now worse on the right. No pneumothorax. No definite pleural effusion. Stable visualized osseous structures. IMPRESSION: Continued diffuse reticulonodular opacity with lower lung volumes and worsening ventilation since 10/11/2020 at both lung bases. Electronically Signed   By: Genevie Ann M.D.   On: 10/13/2020 07:57   DG Chest Port 1V same Day  Result Date: 10/11/2020 CLINICAL DATA:  Hypoxia.  Shortness of breath. EXAM: PORTABLE CHEST 1 VIEW COMPARISON:  October 06, 2020. FINDINGS: The heart size and mediastinal contours are within normal limits. No pneumothorax or pleural effusion is noted. Stable bilateral lung opacities are noted concerning for multifocal pneumonia. The visualized skeletal structures are unremarkable. IMPRESSION: Stable bilateral lung opacities are noted concerning for multifocal pneumonia. Electronically Signed   By: Marijo Conception M.D.   On: 10/11/2020 10:06   VAS [place] LOWER EXTREMITY VENOUS (DVT)  Result Date: 10/12/2020  Lower Venous DVT Study Indications: Worsening hypoxia, Covid-19.  Comparison Study: No prior studies. Performing Technologist: Darlin Coco, RDMS  Examination Guidelines: A complete evaluation includes B-mode imaging, spectral Doppler, color Doppler, and power Doppler as needed of  all accessible portions of each vessel. Bilateral testing is considered an integral part of a complete examination. Limited examinations for reoccurring indications may be performed as noted. The reflux portion of the exam is performed with the patient in reverse Trendelenburg.  +---------+---------------+---------+-----------+----------+--------------+ RIGHT    CompressibilityPhasicitySpontaneityPropertiesThrombus Aging +---------+---------------+---------+-----------+----------+--------------+ CFV      Full           Yes      Yes                                 +---------+---------------+---------+-----------+----------+--------------+ SFJ      Full                                                        +---------+---------------+---------+-----------+----------+--------------+ FV Prox  Full                                                        +---------+---------------+---------+-----------+----------+--------------+ FV Mid   Full                                                        +---------+---------------+---------+-----------+----------+--------------+  FV DistalFull                                                        +---------+---------------+---------+-----------+----------+--------------+ PFV      Full                                                        +---------+---------------+---------+-----------+----------+--------------+ POP      Full           Yes      Yes                                 +---------+---------------+---------+-----------+----------+--------------+ PTV      Full                                                        +---------+---------------+---------+-----------+----------+--------------+ PERO     Full                                                        +---------+---------------+---------+-----------+----------+--------------+    +---------+---------------+---------+-----------+----------+--------------+ LEFT     CompressibilityPhasicitySpontaneityPropertiesThrombus Aging +---------+---------------+---------+-----------+----------+--------------+ CFV      Full           Yes      Yes                                 +---------+---------------+---------+-----------+----------+--------------+ SFJ      Full                                                        +---------+---------------+---------+-----------+----------+--------------+ FV Prox  Full                                                        +---------+---------------+---------+-----------+----------+--------------+ FV Mid   Full                                                        +---------+---------------+---------+-----------+----------+--------------+ FV DistalFull                                                        +---------+---------------+---------+-----------+----------+--------------+  PFV      Full                                                        +---------+---------------+---------+-----------+----------+--------------+ POP      Full           Yes      Yes                                 +---------+---------------+---------+-----------+----------+--------------+ PTV      Full                                                        +---------+---------------+---------+-----------+----------+--------------+ PERO     Full                                                        +---------+---------------+---------+-----------+----------+--------------+     Summary: RIGHT: - There is no evidence of deep vein thrombosis in the lower extremity.  - No cystic structure found in the popliteal fossa.  LEFT: - There is no evidence of deep vein thrombosis in the lower extremity.  - No cystic structure found in the popliteal fossa.  *See table(s) above for measurements and observations. Electronically signed  by Ruta Hinds MD on 10/12/2020 at 10:00:37 AM.    Final    ECHOCARDIOGRAM LIMITED  Result Date: 09/28/2020    ECHOCARDIOGRAM LIMITED REPORT   Patient Name:   Matthew [name] Date of Exam: 09/28/2020 Medical Rec #:  [id number]          Height:       71.0 in Accession #:    [id number]         Weight:       244.3 lb Date of Birth:  1966-02-15          BSA:          2.296 m Patient Age:    17 years           BP:           120/84 mmHg Patient Gender: M                  HR:           73 bpm. Exam Location:  Inpatient Procedure: Limited Echo, Limited Color Doppler and Cardiac Doppler Indications:    acute respiratory distress  History:        Patient has prior history of Echocardiogram examinations, most                 recent 03/30/2016. CAD; Covid.  Sonographer:    Johny Chess Referring Phys: [id number] Candace Gallus MELVIN  Sonographer Comments: Image acquisition challenging due to respiratory motion. IMPRESSIONS  1. Left ventricular ejection fraction, by estimation, is 60 to 65%. The left ventricle has normal function. The left ventricle has no regional wall motion abnormalities. There is mild left ventricular hypertrophy.  2. Right ventricular systolic function is normal. The right ventricular size is normal.  3. The mitral valve is normal in structure. No evidence of mitral valve regurgitation. No evidence of mitral stenosis.  4. The aortic valve is tricuspid.  5. The inferior vena cava is normal in size with greater than 50% respiratory variability, suggesting right atrial pressure of 3 mmHg. FINDINGS  Left Ventricle: Left ventricular ejection fraction, by estimation, is 60 to 65%. The left ventricle has normal function. The left ventricle has no regional wall motion abnormalities. The left ventricular internal cavity size was normal in size. There is  mild left ventricular hypertrophy. Right Ventricle: The right ventricular size is normal. No increase in right ventricular wall thickness. Right ventricular  systolic function is normal. Pericardium: There is no evidence of pericardial effusion. Mitral Valve: The mitral valve is normal in structure. No evidence of mitral valve stenosis. Tricuspid Valve: The tricuspid valve is normal in structure. Tricuspid valve regurgitation is not demonstrated. No evidence of tricuspid stenosis. Aortic Valve: The aortic valve is tricuspid. Pulmonic Valve: The pulmonic valve was not well visualized. Pulmonic valve regurgitation is not visualized. No evidence of pulmonic stenosis. Aorta: The aortic root is normal in size and structure. Pulmonary Artery: Indeterminant PASP, inadequate TR jet. Venous: The inferior vena cava is normal in size with greater than 50% respiratory variability, suggesting right atrial pressure of 3 mmHg. IAS/Shunts: No atrial level shunt detected by color flow Doppler. LEFT VENTRICLE PLAX 2D LVIDd:         4.20 cm LVIDs:         2.80 cm LV PW:         1.30 cm LV IVS:        1.10 cm LVOT diam:     2.00 cm LV SV:         71 LV SV Index:   31 LVOT Area:     3.14 cm  IVC IVC diam: 1.70 cm LEFT ATRIUM         Index LA diam:    4.30 cm 1.87 cm/m  AORTIC VALVE LVOT Vmax:   116.00 cm/s LVOT Vmean:  76.100 cm/s LVOT VTI:    0.227 m  AORTA Ao Root diam: 3.10 cm MITRAL VALVE MV Area (PHT): 2.91 cm    SHUNTS MV Decel Time: 261 msec    Systemic VTI:  0.23 m MV E velocity: 75.40 cm/s  Systemic Diam: 2.00 cm MV A velocity: 66.00 cm/s MV E/A ratio:  1.14 Dina Rich MD Electronically signed by Dina Rich MD Signature Date/Time: 09/28/2020/2:52:33 PM    Final

## 2020-10-17 LAB — COMPREHENSIVE METABOLIC PANEL
ALT: 127 U/L — ABNORMAL HIGH (ref 0–44)
AST: 28 U/L (ref 15–41)
Albumin: 2.7 g/dL — ABNORMAL LOW (ref 3.5–5.0)
Alkaline Phosphatase: 93 U/L (ref 38–126)
Anion gap: 11 (ref 5–15)
BUN: 29 mg/dL — ABNORMAL HIGH (ref 6–20)
CO2: 28 mmol/L (ref 22–32)
Calcium: 8.7 mg/dL — ABNORMAL LOW (ref 8.9–10.3)
Chloride: 98 mmol/L (ref 98–111)
Creatinine, Ser: 0.97 mg/dL (ref 0.61–1.24)
GFR, Estimated: >60 mL/min (ref >60)
Glucose, Bld: 192 mg/dL — ABNORMAL HIGH (ref 70–99)
Potassium: 4.7 mmol/L (ref 3.5–5.1)
Sodium: 137 mmol/L (ref 135–145)
Total Bilirubin: 1.2 mg/dL (ref 0.3–1.2)
Total Protein: 6.9 g/dL (ref 6.5–8.1)

## 2020-10-17 LAB — CBC
HCT: 41.7 % (ref 39.0–52.0)
Hemoglobin: 13.7 g/dL (ref 13.0–17.0)
MCH: 30.2 pg (ref 26.0–34.0)
MCHC: 32.9 g/dL (ref 30.0–36.0)
MCV: 92.1 fL (ref 80.0–100.0)
Platelets: 158 10*3/uL (ref 150–400)
RBC: 4.53 MIL/uL (ref 4.22–5.81)
RDW: 13.2 % (ref 11.5–15.5)
WBC: 17.4 10*3/uL — ABNORMAL HIGH (ref 4.0–10.5)
nRBC: 0 % (ref 0.0–0.2)

## 2020-10-17 LAB — D-DIMER, QUANTITATIVE: D-Dimer, Quant: 1.11 ug/mL-FEU — ABNORMAL HIGH (ref 0.00–0.50)

## 2020-10-17 LAB — C-REACTIVE PROTEIN: CRP: 4.3 mg/dL — ABNORMAL HIGH (ref <1.0)

## 2020-10-17 MED ORDER — GUAIFENESIN-DM 100-10 MG/5ML PO SYRP
10.0000 mL | ORAL_SOLUTION | Freq: Once | ORAL | Status: AC
  Administered 2020-10-17: 10 mL via ORAL
  Filled 2020-10-17: qty 10

## 2020-10-17 MED ORDER — PREDNISONE 20 MG PO TABS
40.0000 mg | ORAL_TABLET | Freq: Every day | ORAL | Status: DC
  Administered 2020-10-18 – 2020-10-20 (×3): 40 mg via ORAL
  Filled 2020-10-17 (×3): qty 2

## 2020-10-17 NOTE — Progress Notes (Signed)
Physical Therapy Treatment Patient Details Name: Matthew [name] MRN: [id number] DOB: 10-19-1965 Today's Date: 10/17/2020    History of Present Illness Pt is a 55 y.o. male admitted 09/26/20 with SOB and GI distress (had declined COVID test when seen in urgent care). Pt now testing (+) COVID-19. Workup for acute hypoxic respiratory failure due to acute COVID-19 viral PNA. Course complicated by hemoptysis, worsening hypoxemis requiring HHFNC; pt unable to lay flat for chest CTA. Negative DVT on 12/31. PMH includes CAD, CHF, tobacco use (quit 11/2019); pt unvaccinated.   PT Comments    Pt agreeable to participation after receiving anxiety medications. Pt not progressing with activity tolerance or respiratory status. Ultimately taking >30-min to transfer to recliner with minA and still not fully recovered from hypoxia by end of session. SpO2 down to 69% on 60L O2 HHFNC at 60% FiO2, pt with significant SOB and anxiety related to difficulty breathing. Pt aware of anxiety, but still having difficulty getting in control of it. SpO2 up to 81% after >10-min seated rest. RN aware and to continue monitoring. Will continue to follow acutely and progress activity tolerance as able.     Follow Up Recommendations  Home health PT;Supervision for mobility/OOB (pending progress)     Equipment Recommendations  None recommended by PT    Recommendations for Other Services       Precautions / Restrictions Precautions Precautions: Fall;Other (comment) Precaution Comments: Significant anxiety; watch SpO2 - SpO2 down to 69% on 60L O2 HHFNC (at 60% FiO2) + NRB Restrictions Weight Bearing Restrictions: No    Mobility  Bed Mobility Overal bed mobility: Modified Independent             General bed mobility comments: Recieved in sidelying, mod indep to come to sitting with use of bed rail and HOB elevated; significant increased time to prepare to come to sitting (pt reports, "It goes better if I wait a few  minutes for the anxiety meds to work") and to recover with any movement  Transfers Overall transfer level: Needs assistance Equipment used: 1 person hand held assist Transfers: Sit to/from Stand Sit to Stand: Min assist Stand pivot transfers: Min assist       General transfer comment: MinA for HHA to elevate trunk and maintain stability for standing from EOB and pivot to recliner; good awareness of line; >15 minutes to recover SOB and hypoxia after stand pivot to recliner  Ambulation/Gait                 Stairs             Wheelchair Mobility    Modified Rankin (Stroke Patients Only)       Balance Overall balance assessment: Needs assistance   Sitting balance-Leahy Scale: Good       Standing balance-Leahy Scale: Poor Standing balance comment: HHA to maintain balance with standing                            Cognition Arousal/Alertness: Awake/alert Behavior During Therapy: Anxious Overall Cognitive Status: Within Functional Limits for tasks assessed                                        Exercises      General Comments General comments (skin integrity, edema, etc.): Attempted education on relaxation strategies-pt not interested, preferring silence and holding hand  while recovering from significant SOB/anxiety/hypoxia; frequent cues for breathing technique      Pertinent Vitals/Pain Pain Assessment: No/denies pain Pain Intervention(s): Monitored during session    Home Living                      Prior Function            PT Goals (current goals can now be found in the care plan section) Progress towards PT goals: Not progressing toward goals - comment (limited by anxiety, significant hypoxia with prolonged recovery time)    Frequency    Min 3X/week      PT Plan Current plan remains appropriate    Co-evaluation              AM-PAC PT "6 Clicks" Mobility   Outcome Measure  Help needed  turning from your back to your side while in a flat bed without using bedrails?: None Help needed moving from lying on your back to sitting on the side of a flat bed without using bedrails?: None Help needed moving to and from a bed to a chair (including a wheelchair)?: A Little Help needed standing up from a chair using your arms (e.g., wheelchair or bedside chair)?: A Little Help needed to walk in hospital room?: A Lot Help needed climbing 3-5 steps with a railing? : A Lot 6 Click Score: 18    End of Session Equipment Utilized During Treatment: Oxygen Activity Tolerance: Treatment limited secondary to medical complications (Comment) Patient left: in chair;with call bell/phone within reach Nurse Communication: Mobility status PT Visit Diagnosis: Other abnormalities of gait and mobility (R26.89)     Time: 0174-9449 PT Time Calculation (min) (ACUTE ONLY): 35 min  Charges:  $Therapeutic Activity: 23-37 mins                    Ina Homes, PT, DPT Acute Rehabilitation Services  Pager [phone] Office [phone]  Malachy Chamber 10/17/2020, 1:06 PM

## 2020-10-17 NOTE — Progress Notes (Signed)
PROGRESS NOTE                                                                                                                                                                                                             Patient Demographics:    Matthew [name], is a 55 y.o. male, DOB - 1966/09/11, SR:[id number]  Outpatient Primary MD for the patient is Patient, No Pcp Per   Admit date - 09/26/2020   LOS - 21  No chief complaint on file.      Brief Narrative: Patient is a 55 y.o. male with PMHx of  CAD s/p PCI, tobacco use, HLD, GERD-who presented with shortness of breath-found to have acute hypoxic respiratory failure requiring 100% O2 via NRB due to COVID-19 pneumonia.  COVID-19 vaccinated status: Unvaccinated  Significant Events: 12/9>> started having GI symptoms including diarrhea 12/13>> to urgent care-for diarrhea-discharged after supportive care/IV fluids 12/16>> Admit to [facility] for hypoxia due to COVID-19 pneumonia-requiring 100% O2 via NRB 12/28>> hemoptysis-PCCM consulted-supportive care recommended. 12/31>> worsening hypoxemia-started on heated high flow-unable to lie flat for a CTA chest  Significant studies: 12/17>> CTA chest: No PE, diffuse groundglass opacities 12/18>> Echo: EF 60-65% 12/26>> chest x-ray: Diffuse bilateral heterogeneous/interstitial airspace opacities 12/31>> chest x-ray: Stable bilateral lung opacities 12/31>> lower extremity Doppler: No DVT 1/5>> chest x-ray: Mild improvement in multifocal opacities   COVID-19 medications: Steroids: 12/16>> Remdesivir: 12/16>> 12/20 Baricitinib: 12/17>>12/30  Antibiotics: None  Microbiology data: None  Procedures: None  Consults: PCCM  DVT prophylaxis: Lovenox     Subjective:   Remains essentially unchanged.  Lying comfortably in bed this morning-continues to have severe exertional dyspnea-remains on heated high flow and  intermittent use of NRB.   Assessment  & Plan :   Acute Hypoxic Resp Failure due to Covid 19 Viral pneumonia: Unchanged-remains essentially the same-on HHFNC and intermittent use of NRB.  No signs of volume overload-change from IV Solu-Medrol to p.o. prednisone.  No signs of volume overload-received IV Lasix yesterday-continue to attempt to keep in negative balance.  Continue supportive care-continue to attempt to slowly titrate down FiO2-if he worsens significantly-we will require transfer to the ICU.   Note-discussed case with ID MD-Dr. Marcelline Mates improving-decreasing oxygen requirements for the past several days-it appears that he first had symptoms on 12/9.  He is currently more than 21 days out  from his date of initial symptoms-and does not require any further isolation-however due to worsening hypoxemia-I have elected to keep him in 5 W.   Fever: afebrile O2 requirements:  SpO2: 96 % O2 Flow Rate (L/min): 40 L/min FiO2 (%): (S) 60 %   COVID-19 Labs: Recent Labs    10/15/20 0420 10/16/20 0315 10/17/20 0219  DDIMER  --  0.95* 1.11*  CRP 2.8* 3.6* 4.3*       Component Value Date/Time   BNP 53.8 10/11/2020 1024    Recent Labs  Lab 10/11/20 1024  PROCALCITON <0.10    Lab Results  Component Value Date   SARSCOV2NAA POSITIVE (A) 09/26/2020   SARSCOV2NAA NEGATIVE 02/06/2020   [place] NEGATIVE 11/17/2019    Prone/Incentive Spirometry: encouraged  incentive spirometry use 3-4/hour.  Acute on chronic diastolic heart failure: No evidence of volume overload-supportive care-keep in negative balance-continue to dose IV Lasix intermittently.  Hemoptysis: Isolated episode that occurred on 12/28-likely related to underlying parenchymal injury from COVID-19 pneumonia.  No further episodes since then.  Continue to monitor closely-if reoccurs-we will reconsult PCCM.  Transaminitis: Due to COVID-19-downtrending-follow periodically  CAD s/p PCI: No anginal symptoms-not on any  antiplatelets at home.  Per patient report-PCI was approximately 11 years back  GERD: Continue PPI  Anxiety: Continues to have significant amount of anxiety-Lexapro increased to 10 mg daily-continue as needed Klonopin.  Constipation-continue MiraLAX  Obesity: Estimated body mass index is 30.87 kg/m as calculated from the following:   Height as of this encounter: 5\' 11"  (1.803 m).   Weight as of this encounter: 100.4 kg.    GI prophylaxis: PPI  ABG:    Component Value Date/Time   PHART 7.464 (H) 09/26/2020 2035   PCO2ART 31.7 (L) 09/26/2020 2035   PO2ART 90 09/26/2020 2035   HCO3 22.8 09/26/2020 2035   TCO2 24 09/26/2020 2035   ACIDBASEDEF 0.8 11/17/2019 1618   O2SAT 98.0 09/26/2020 2035    Vent Settings: N/A FiO2 (%):  [60 %-80 %] 60 %  Condition - Guarded  Family Communication  :  Spouse-Diana Natarajan [phone]- on 1/6  Code Status :  Full Code  Diet :  Diet Order            Diet Heart Room service appropriate? No; Fluid consistency: Thin; Fluid restriction: 2000 mL Fluid  Diet effective now                  Disposition Plan  :   Status is: Inpatient  Remains inpatient appropriate because:Inpatient level of care appropriate due to severity of illness   Dispo: The patient is from: Home              Anticipated d/c is to: Home              Anticipated d/c date is: > 3 days              Patient currently is not medically stable to d/c.   Barriers to discharge: Hypoxia requiring O2 supplementation  Antimicorbials  :    Anti-infectives (From admission, onward)   Start     Dose/Rate Route Frequency Ordered Stop   09/27/20 1000  remdesivir 100 mg in sodium chloride 0.9 % 100 mL IVPB       "Followed by" Linked Group Details   100 mg 200 mL/hr over 30 Minutes Intravenous Daily 09/26/20 2208 09/30/20 1900   09/26/20 2215  remdesivir 200 mg in sodium chloride 0.9% 250 mL IVPB       "  Followed by" Linked Group Details   200 mg 580 mL/hr over 30  Minutes Intravenous Once 09/26/20 2208 09/27/20 0146      Inpatient Medications  Scheduled Meds: . benzonatate  200 mg Oral TID  . enoxaparin (LOVENOX) injection  50 mg Subcutaneous Daily  . escitalopram  10 mg Oral Daily  . feeding supplement  237 mL Oral BID BM  . melatonin  3 mg Oral QHS  . methylPREDNISolone (SOLU-MEDROL) injection  40 mg Intravenous Q12H  . pantoprazole  40 mg Oral Daily  . polyethylene glycol  17 g Oral Daily  . sodium chloride flush  10-40 mL Intracatheter Q12H  . sodium chloride flush  3 mL Intravenous Q12H   Continuous Infusions:  PRN Meds:.acetaminophen, albuterol, bisacodyl, clonazePAM, menthol-cetylpyridinium, ondansetron (ZOFRAN) IV, senna-docusate, sodium chloride, sodium chloride flush   Time Spent in minutes  35   See all Orders from today for further details   Jeoffrey Massed M.D on 10/17/2020 at 1:28 PM  To page go to [email or web address] - use universal password  [facility] -  Office  [phone]    Objective:   Vitals:   10/17/20 0500 10/17/20 0551 10/17/20 1049 10/17/20 1300  BP: (!) 82/59 105/65 119/75 102/67  Pulse: 64  94 93  Resp: 18  20 19   Temp: (!) 97.1 F (36.2 C)  97.8 F (36.6 C) 97.6 F (36.4 C)  TempSrc: Axillary  Axillary Axillary  SpO2: 100%  (!) 84% 96%  Weight:      Height:        Wt Readings from Last 3 Encounters:  10/14/20 100.4 kg  12/04/19 108.4 kg  11/20/19 107.9 kg     Intake/Output Summary (Last 24 hours) at 10/17/2020 1328 Last data filed at 10/17/2020 12/15/2020 Gross per 24 hour  Intake 13 ml  Output 1700 ml  Net -1687 ml     Physical Exam Gen Exam:Alert awake-not in any distress HEENT:atraumatic, normocephalic Chest: B/L clear to auscultation anteriorly CVS:S1S2 regular Abdomen:soft non tender, non distended Extremities:no edema Neurology: Non focal Skin: no rash   Data Review:    CBC Recent Labs  Lab 10/13/20 0410 10/14/20 0407 10/15/20 0420 10/16/20 0315 10/17/20 0219  WBC  16.7* 15.5* 15.9* 15.7* 17.4*  HGB 13.5 13.3 13.0 13.1 13.7  HCT 38.3* 38.2* 38.3* 37.6* 41.7  PLT 222 188 169 150 158  MCV 91.0 91.6 92.7 91.7 92.1  MCH 32.1 31.9 31.5 32.0 30.2  MCHC 35.2 34.8 33.9 34.8 32.9  RDW 13.3 13.2 13.2 13.2 13.2    Chemistries  Recent Labs  Lab 10/13/20 0410 10/14/20 0407 10/15/20 0420 10/16/20 0315 10/17/20 0219  NA 135 136 136 135 137  K 4.4 4.4 4.6 4.5 4.7  CL 97* 99 99 101 98  CO2 25 27 27 26 28   GLUCOSE 164* 176* 166* 174* 192*  BUN 28* 31* 27* 24* 29*  CREATININE 1.00 1.04 1.06 0.85 0.97  CALCIUM 8.4* 8.5* 8.6* 8.3* 8.7*  MG 2.6*  --   --   --   --   AST 21 21 30  32 28  ALT 55* 57* 81* 115* 127*  ALKPHOS 86 81 75 83 93  BILITOT 0.9 1.0 1.0 0.8 1.2   ------------------------------------------------------------------------------------------------------------------ No results for input(s): CHOL, HDL, LDLCALC, TRIG, CHOLHDL, LDLDIRECT in the last 72 hours.  No results found for: HGBA1C ------------------------------------------------------------------------------------------------------------------ No results for input(s): TSH, T4TOTAL, T3FREE, THYROIDAB in the last 72 hours.  Invalid input(s): FREET3 ------------------------------------------------------------------------------------------------------------------ No results for input(s): VITAMINB12, FOLATE,  FERRITIN, TIBC, IRON, RETICCTPCT in the last 72 hours.  Coagulation profile No results for input(s): INR, PROTIME in the last 168 hours.  Recent Labs    10/16/20 0315 10/17/20 0219  DDIMER 0.95* 1.11*    Cardiac Enzymes No results for input(s): CKMB, TROPONINI, MYOGLOBIN in the last 168 hours.  Invalid input(s): CK ------------------------------------------------------------------------------------------------------------------    Component Value Date/Time   BNP 53.8 10/11/2020 1024    Micro Results No results found for this or any previous visit (from the past 240  hour(s)).  Radiology Reports CT ANGIO CHEST PE W OR WO CONTRAST  Result Date: 09/27/2020 CLINICAL DATA:  55 year old male with history of COVID-19, suspected pulmonary embolism. EXAM: CT ANGIOGRAPHY CHEST WITH CONTRAST TECHNIQUE: Multidetector CT imaging of the chest was performed using the standard protocol during bolus administration of intravenous contrast. Multiplanar CT image reconstructions and MIPs were obtained to evaluate the vascular anatomy. CONTRAST:  100 mL Omnipaque 350, intravenous COMPARISON:  Chest CT from 11/17/2019 FINDINGS: Cardiovascular: Satisfactory opacification of the pulmonary arteries to the segmental level. No evidence of pulmonary embolism. Normal heart size. No pericardial effusion. The heart is normal in size. No pericardial effusion. Severe coronary atherosclerotic calcifications. Mediastinum/Nodes: Similar appearing prominent right paratracheal lymph node measuring up to 1.2 cm in short axis. Otherwise no enlarged mediastinal, hilar, or axillary lymph nodes. Thyroid gland, trachea, and esophagus demonstrate no significant findings. Lungs/Pleura: Interval development of near diffuse, peripherally predominant ground-glass pulmonary opacities with a mid lung and basal predominance. Similar appearing moderate upper lobe predominant paraseptal and centrilobular emphysema. No pleural effusion or pneumothorax. Upper Abdomen: The visualized upper abdomen is within normal limits. Musculoskeletal: No chest wall abnormality. No acute or significant osseous findings. Review of the MIP images confirms the above findings. IMPRESSION: Vascular: 1. No evidence of pulmonary embolism. 2. Severe coronary atherosclerotic calcifications. Non-Vascular: 1. Diffuse, mid lung and basilar, peripherally predominant ground-glass opacities - findings compatible with multifocal pneumonia associated with COVID-19. 2. Similar appearing moderate upper lobe predominant centrilobular and paraseptal emphysema.  Electronically Signed   By: Ruthann Cancer MD   On: 09/27/2020 14:31   DG Chest Port 1 View  Result Date: 10/06/2020 CLINICAL DATA:  COVID positive, pleural effusion, pneumothorax EXAM: PORTABLE CHEST 1 VIEW COMPARISON:  09/26/2020 FINDINGS: Slight interval increase in diffuse bilateral heterogeneous and interstitial airspace opacity, most conspicuous at the lung bases. No significant pleural effusion noted. No pneumothorax. The heart and mediastinum are unremarkable. IMPRESSION: 1. Slight interval increase in diffuse bilateral heterogeneous and interstitial airspace opacity, most conspicuous at the lung bases, consistent with worsened COVID airspace disease. 2.  No significant pleural effusion noted. No pneumothorax. Electronically Signed   By: Eddie Candle M.D.   On: 10/06/2020 09:37   DG CHEST PORT 1 VIEW  Result Date: 09/26/2020 CLINICAL DATA:  COVID-19 EXAM: PORTABLE CHEST 1 VIEW COMPARISON:  09/26/2020 FINDINGS: Slight worsening of bilateral basilar predominant opacities. No pleural effusion or pneumothorax. Normal cardiomediastinal contours. IMPRESSION: Slight worsening of bilateral basilar predominant opacities. Electronically Signed   By: Ulyses Jarred M.D.   On: 09/26/2020 23:26   DG Chest Portable 1 View  Result Date: 09/26/2020 CLINICAL DATA:  Cough and shortness of breath. EXAM: PORTABLE CHEST 1 VIEW COMPARISON:  12/04/2019 FINDINGS: Cardiomegaly which is increased from prior exam. Pulmonary edema, moderate in degree. There are bilateral pleural effusions. No confluent consolidation. No pneumothorax. No acute osseous abnormalities are seen. IMPRESSION: CHF with cardiomegaly, pulmonary edema and bilateral pleural effusions. Electronically Signed   By: Threasa Beards  Sanford M.D.   On: 09/26/2020 19:14   DG Chest Port 1V same Day  Result Date: 10/16/2020 CLINICAL DATA:  COVID positive, short of breath EXAM: PORTABLE CHEST 1 VIEW COMPARISON:  None. FINDINGS: Normal cardiac silhouette. Bilateral  peripheral fine airspace disease appears slightly less dense than comparison exam. No pneumothorax. No focal consolidation. IMPRESSION: Mild improvement in bilateral multifocal viral pneumonia. Electronically Signed   By: Suzy Bouchard M.D.   On: 10/16/2020 13:32   DG Chest Port 1V same Day  Result Date: 10/13/2020 CLINICAL DATA:  55 year old male with respiratory failure. Recent bronchoscopy and hemoptysis. History of COVID-19. EXAM: PORTABLE CHEST 1 VIEW COMPARISON:  Portable chest 10/11/2020 and earlier. FINDINGS: Portable AP semi upright view at 0554 hours. Lower lung volumes. Stable cardiac size and mediastinal contours. Visualized tracheal air column is within normal limits. Diffuse reticulonodular pulmonary opacity with confluent density in the peripheral right lung and at the lung bases now worse on the right. No pneumothorax. No definite pleural effusion. Stable visualized osseous structures. IMPRESSION: Continued diffuse reticulonodular opacity with lower lung volumes and worsening ventilation since 10/11/2020 at both lung bases. Electronically Signed   By: Genevie Ann M.D.   On: 10/13/2020 07:57   DG Chest Port 1V same Day  Result Date: 10/11/2020 CLINICAL DATA:  Hypoxia.  Shortness of breath. EXAM: PORTABLE CHEST 1 VIEW COMPARISON:  October 06, 2020. FINDINGS: The heart size and mediastinal contours are within normal limits. No pneumothorax or pleural effusion is noted. Stable bilateral lung opacities are noted concerning for multifocal pneumonia. The visualized skeletal structures are unremarkable. IMPRESSION: Stable bilateral lung opacities are noted concerning for multifocal pneumonia. Electronically Signed   By: Marijo Conception M.D.   On: 10/11/2020 10:06   VAS [place] LOWER EXTREMITY VENOUS (DVT)  Result Date: 10/12/2020  Lower Venous DVT Study Indications: Worsening hypoxia, Covid-19.  Comparison Study: No prior studies. Performing Technologist: Darlin Coco, RDMS  Examination Guidelines: A  complete evaluation includes B-mode imaging, spectral Doppler, color Doppler, and power Doppler as needed of all accessible portions of each vessel. Bilateral testing is considered an integral part of a complete examination. Limited examinations for reoccurring indications may be performed as noted. The reflux portion of the exam is performed with the patient in reverse Trendelenburg.  +---------+---------------+---------+-----------+----------+--------------+ RIGHT    CompressibilityPhasicitySpontaneityPropertiesThrombus Aging +---------+---------------+---------+-----------+----------+--------------+ CFV      Full           Yes      Yes                                 +---------+---------------+---------+-----------+----------+--------------+ SFJ      Full                                                        +---------+---------------+---------+-----------+----------+--------------+ FV Prox  Full                                                        +---------+---------------+---------+-----------+----------+--------------+ FV Mid   Full                                                        +---------+---------------+---------+-----------+----------+--------------+  FV DistalFull                                                        +---------+---------------+---------+-----------+----------+--------------+ PFV      Full                                                        +---------+---------------+---------+-----------+----------+--------------+ POP      Full           Yes      Yes                                 +---------+---------------+---------+-----------+----------+--------------+ PTV      Full                                                        +---------+---------------+---------+-----------+----------+--------------+ PERO     Full                                                         +---------+---------------+---------+-----------+----------+--------------+   +---------+---------------+---------+-----------+----------+--------------+ LEFT     CompressibilityPhasicitySpontaneityPropertiesThrombus Aging +---------+---------------+---------+-----------+----------+--------------+ CFV      Full           Yes      Yes                                 +---------+---------------+---------+-----------+----------+--------------+ SFJ      Full                                                        +---------+---------------+---------+-----------+----------+--------------+ FV Prox  Full                                                        +---------+---------------+---------+-----------+----------+--------------+ FV Mid   Full                                                        +---------+---------------+---------+-----------+----------+--------------+ FV DistalFull                                                        +---------+---------------+---------+-----------+----------+--------------+  PFV      Full                                                        +---------+---------------+---------+-----------+----------+--------------+ POP      Full           Yes      Yes                                 +---------+---------------+---------+-----------+----------+--------------+ PTV      Full                                                        +---------+---------------+---------+-----------+----------+--------------+ PERO     Full                                                        +---------+---------------+---------+-----------+----------+--------------+     Summary: RIGHT: - There is no evidence of deep vein thrombosis in the lower extremity.  - No cystic structure found in the popliteal fossa.  LEFT: - There is no evidence of deep vein thrombosis in the lower extremity.  - No cystic structure found in the popliteal fossa.   *See table(s) above for measurements and observations. Electronically signed by Ruta Hinds MD on 10/12/2020 at 10:00:37 AM.    Final    ECHOCARDIOGRAM LIMITED  Result Date: 09/28/2020    ECHOCARDIOGRAM LIMITED REPORT   Patient Name:   Matthew [name] Date of Exam: 09/28/2020 Medical Rec #:  [id number]          Height:       71.0 in Accession #:    [id number]         Weight:       244.3 lb Date of Birth:  05/02/1966          BSA:          2.296 m Patient Age:    78 years           BP:           120/84 mmHg Patient Gender: M                  HR:           73 bpm. Exam Location:  Inpatient Procedure: Limited Echo, Limited Color Doppler and Cardiac Doppler Indications:    acute respiratory distress  History:        Patient has prior history of Echocardiogram examinations, most                 recent 03/30/2016. CAD; Covid.  Sonographer:    Johny Chess Referring Phys: [id number] Candace Gallus MELVIN  Sonographer Comments: Image acquisition challenging due to respiratory motion. IMPRESSIONS  1. Left ventricular ejection fraction, by estimation, is 60 to 65%. The left ventricle has normal function. The left ventricle has no regional wall motion abnormalities. There is mild left ventricular hypertrophy.  2. Right ventricular systolic function is normal. The right ventricular size is normal.  3. The mitral valve is normal in structure. No evidence of mitral valve regurgitation. No evidence of mitral stenosis.  4. The aortic valve is tricuspid.  5. The inferior vena cava is normal in size with greater than 50% respiratory variability, suggesting right atrial pressure of 3 mmHg. FINDINGS  Left Ventricle: Left ventricular ejection fraction, by estimation, is 60 to 65%. The left ventricle has normal function. The left ventricle has no regional wall motion abnormalities. The left ventricular internal cavity size was normal in size. There is  mild left ventricular hypertrophy. Right Ventricle: The right ventricular size  is normal. No increase in right ventricular wall thickness. Right ventricular systolic function is normal. Pericardium: There is no evidence of pericardial effusion. Mitral Valve: The mitral valve is normal in structure. No evidence of mitral valve stenosis. Tricuspid Valve: The tricuspid valve is normal in structure. Tricuspid valve regurgitation is not demonstrated. No evidence of tricuspid stenosis. Aortic Valve: The aortic valve is tricuspid. Pulmonic Valve: The pulmonic valve was not well visualized. Pulmonic valve regurgitation is not visualized. No evidence of pulmonic stenosis. Aorta: The aortic root is normal in size and structure. Pulmonary Artery: Indeterminant PASP, inadequate TR jet. Venous: The inferior vena cava is normal in size with greater than 50% respiratory variability, suggesting right atrial pressure of 3 mmHg. IAS/Shunts: No atrial level shunt detected by color flow Doppler. LEFT VENTRICLE PLAX 2D LVIDd:         4.20 cm LVIDs:         2.80 cm LV PW:         1.30 cm LV IVS:        1.10 cm LVOT diam:     2.00 cm LV SV:         71 LV SV Index:   31 LVOT Area:     3.14 cm  IVC IVC diam: 1.70 cm LEFT ATRIUM         Index LA diam:    4.30 cm 1.87 cm/m  AORTIC VALVE LVOT Vmax:   116.00 cm/s LVOT Vmean:  76.100 cm/s LVOT VTI:    0.227 m  AORTA Ao Root diam: 3.10 cm MITRAL VALVE MV Area (PHT): 2.91 cm    SHUNTS MV Decel Time: 261 msec    Systemic VTI:  0.23 m MV E velocity: 75.40 cm/s  Systemic Diam: 2.00 cm MV A velocity: 66.00 cm/s MV E/A ratio:  1.14 Carlyle Dolly MD Electronically signed by Carlyle Dolly MD Signature Date/Time: 09/28/2020/2:52:33 PM    Final

## 2020-10-18 LAB — CBC
HCT: 41.6 % (ref 39.0–52.0)
Hemoglobin: 14.6 g/dL (ref 13.0–17.0)
MCH: 31.7 pg (ref 26.0–34.0)
MCHC: 35.1 g/dL (ref 30.0–36.0)
MCV: 90.4 fL (ref 80.0–100.0)
Platelets: 160 10*3/uL (ref 150–400)
RBC: 4.6 MIL/uL (ref 4.22–5.81)
RDW: 13.2 % (ref 11.5–15.5)
WBC: 15.9 10*3/uL — ABNORMAL HIGH (ref 4.0–10.5)
nRBC: 0 % (ref 0.0–0.2)

## 2020-10-18 LAB — COMPREHENSIVE METABOLIC PANEL
ALT: 123 U/L — ABNORMAL HIGH (ref 0–44)
AST: 32 U/L (ref 15–41)
Albumin: 2.8 g/dL — ABNORMAL LOW (ref 3.5–5.0)
Alkaline Phosphatase: 91 U/L (ref 38–126)
Anion gap: 13 (ref 5–15)
BUN: 29 mg/dL — ABNORMAL HIGH (ref 6–20)
CO2: 27 mmol/L (ref 22–32)
Calcium: 8.8 mg/dL — ABNORMAL LOW (ref 8.9–10.3)
Chloride: 97 mmol/L — ABNORMAL LOW (ref 98–111)
Creatinine, Ser: 1 mg/dL (ref 0.61–1.24)
GFR, Estimated: >60 mL/min (ref >60)
Glucose, Bld: 129 mg/dL — ABNORMAL HIGH (ref 70–99)
Potassium: 3.9 mmol/L (ref 3.5–5.1)
Sodium: 137 mmol/L (ref 135–145)
Total Bilirubin: 1.1 mg/dL (ref 0.3–1.2)
Total Protein: 7.1 g/dL (ref 6.5–8.1)

## 2020-10-18 LAB — D-DIMER, QUANTITATIVE: D-Dimer, Quant: 2.41 ug/mL-FEU — ABNORMAL HIGH (ref 0.00–0.50)

## 2020-10-18 LAB — C-REACTIVE PROTEIN: CRP: 5 mg/dL — ABNORMAL HIGH (ref <1.0)

## 2020-10-18 MED ORDER — ENOXAPARIN SODIUM 60 MG/0.6ML ~~LOC~~ SOLN
50.0000 mg | Freq: Two times a day (BID) | SUBCUTANEOUS | Status: DC
  Administered 2020-10-18 – 2020-10-28 (×21): 50 mg via SUBCUTANEOUS
  Filled 2020-10-18 (×21): qty 0.6

## 2020-10-18 MED ORDER — FUROSEMIDE 10 MG/ML IJ SOLN
20.0000 mg | Freq: Once | INTRAMUSCULAR | Status: AC
  Administered 2020-10-18: 20 mg via INTRAVENOUS
  Filled 2020-10-18: qty 2

## 2020-10-18 NOTE — Progress Notes (Signed)
Occupational Therapy Treatment Patient Details Name: AIJALON KIRTZ MRN: [id number] DOB: 05/18/1966 Today's Date: 10/18/2020    History of present illness Pt is a 55 y.o. male admitted 09/26/20 with SOB and GI distress (had declined COVID test when seen in urgent care). Pt now testing (+) COVID-19. Workup for acute hypoxic respiratory failure due to acute COVID-19 viral PNA. Course complicated by hemoptysis, worsening hypoxemis requiring HHFNC; pt unable to lay flat for chest CTA. Negative DVT on 12/31. PMH includes CAD, CHF, tobacco use (quit 11/2019); pt unvaccinated.   OT comments  Pt making slow progress.  He is limited by anxiety and significant DOE 4/4 with minimal activity.  Focused on controlled breathing techniques and controlling activity in order to maximize his ability to participate.  He required 27 mins to transfer from bed to recliner due to need to return to supine and coaching for breath and anxiety control.  He maintained sp02 93-100% on 40L heated hiflo 90% Fi02 plus non rebreather.  Discharge disposition updated to include LTACH if he is unable to wean adequately.  Will follow.   Follow Up Recommendations  LTACH;Home health OT (will be dependent on his ability to wean from 02)    Equipment Recommendations  Tub/shower bench    Recommendations for Other Services      Precautions / Restrictions Precautions Precautions: Fall;Other (comment) Precaution Comments: significant anxiety       Mobility Bed Mobility Overal bed mobility: Modified Independent                Transfers Overall transfer level: Needs assistance Equipment used: 1 person hand held assist Transfers: Sit to/from Stand;Stand Pivot Transfers Sit to Stand: Min assist Stand pivot transfers: Min assist       General transfer comment: Min A to steady and to guide him to chair    Balance Overall balance assessment: Needs assistance Sitting-balance support: Feet supported Sitting  balance-Leahy Scale: Good       Standing balance-Leahy Scale: Poor Standing balance comment: requires UE support                           ADL either performed or assessed with clinical judgement   ADL Overall ADL's : Needs assistance/impaired Eating/Feeding: Modified independent   Grooming: Brushing hair;Wash/dry hands;Wash/dry face;Set up;Sitting   Upper Body Bathing: Moderate assistance;Sitting   Lower Body Bathing: Moderate assistance;Sit to/from stand   Upper Body Dressing : Moderate assistance;Sitting   Lower Body Dressing: Maximal assistance;Sit to/from stand   Toilet Transfer: Min guard;Stand-pivot   Toileting- Water quality scientist and Hygiene: Minimal assistance;Sit to/from stand       Functional mobility during ADLs: Min guard General ADL Comments: Pt limited by fatigue and DOE with minimal activity     Vision       Perception     Praxis      Cognition Arousal/Alertness: Awake/alert Behavior During Therapy: Anxious Overall Cognitive Status: Within Functional Limits for tasks assessed                                          Exercises     Shoulder Instructions       General Comments Pt with DOE 4/4 with very little activity.  He was on 40L, 90% Fi02, plus a non rebreather with sats 100% at rest.  He moved to EOB mod I,  but sat there for less than 30 seconds before returning to sidelying due to anxiety.  Coached pt on controlled breathing techniques.  Pt then able to return to sitting and then transfer to recliner.  He required assist and instruction on safe technique.  02 sats decreased to 93%, but he recoverd to 98% within 1 min, but DOE 4/4.  Reinforced controlled breathing techniques and encouraged pt.  He reports he feels he is letting everyone down.  attempted to engage him in further activity, but he declined due to anxiety.  Encouraged him to perform theraband exercises frequently throughout the day    Pertinent  Vitals/ Pain       Pain Assessment: No/denies pain  Home Living                                          Prior Functioning/Environment              Frequency  Min 3X/week        Progress Toward Goals  OT Goals(current goals can now be found in the care plan section)  Progress towards OT goals: Not progressing toward goals - comment;Goals drowngraded-see care plan  ADL Goals Pt Will Perform Lower Body Bathing: with modified independence;sit to/from stand Pt Will Perform Lower Body Dressing: with modified independence;sit to/from stand Pt Will Transfer to Toilet: with modified independence;ambulating Pt/caregiver will Perform Home Exercise Program: Increased strength;Both right and left upper extremity;With written HEP provided;Independently;With theraband Additional ADL Goal #1: Pt will independently verbalize 3 energy conservation strategies Additional ADL Goal #2: Pt will maintain SpO2 greater than 88 during functional activity Additional ADL Goal #3: Pt will demonstrate use of 2 anxiety managment strategies with min vc to help with mobility and ADL tasks  Plan Discharge plan needs to be updated    Co-evaluation                 AM-PAC OT "6 Clicks" Daily Activity     Outcome Measure   Help from another person eating meals?: A Little Help from another person taking care of personal grooming?: A Little Help from another person toileting, which includes using toliet, bedpan, or urinal?: A Lot Help from another person bathing (including washing, rinsing, drying)?: A Lot Help from another person to put on and taking off regular upper body clothing?: A Lot Help from another person to put on and taking off regular lower body clothing?: A Lot 6 Click Score: 14    End of Session Equipment Utilized During Treatment: Oxygen  OT Visit Diagnosis: Unsteadiness on feet (R26.81);Other abnormalities of gait and mobility (R26.89);Muscle weakness (generalized)  (M62.81);Dizziness and giddiness (R42)   Activity Tolerance Other (comment) (anxiety and DOE)   Patient Left in chair;with call bell/phone within reach   Nurse Communication Mobility status        Time: 4503-8882 OT Time Calculation (min): 27 min  Charges: OT General Charges $OT Visit: 1 Visit OT Treatments $Therapeutic Activity: 23-37 mins  Nilsa Nutting., OTR/L Acute Rehabilitation Services Pager [phone] Office [phone]    Lucille Passy M 10/18/2020, 2:17 PM

## 2020-10-18 NOTE — Progress Notes (Signed)
PROGRESS NOTE                                                                                                                                                                                                             Patient Demographics:    Matthew [name], is a 56 y.o. male, DOB - Sep 08, 1966, [id number]  Outpatient Primary MD for the patient is Patient, No Pcp Per   Admit date - 09/26/2020   LOS - 22  No chief complaint on file.      Brief Narrative: Patient is a 55 y.o. male with PMHx of  CAD s/p PCI, tobacco use, HLD, GERD-who presented with shortness of breath-found to have acute hypoxic respiratory failure requiring 100% O2 via NRB due to COVID-19 pneumonia.  COVID-19 vaccinated status: Unvaccinated  Significant Events: 12/9>> started having GI symptoms including diarrhea 12/13>> to urgent care-for diarrhea-discharged after supportive care/IV fluids 12/16>> Admit to [facility] for hypoxia due to COVID-19 pneumonia-requiring 100% O2 via NRB 12/28>> hemoptysis-PCCM consulted-supportive care recommended. 12/31>> worsening hypoxemia-started on heated high flow-unable to lie flat for a CTA chest  Significant studies: 12/17>> CTA chest: No PE, diffuse groundglass opacities 12/18>> Echo: EF 60-65% 12/26>> chest x-ray: Diffuse bilateral heterogeneous/interstitial airspace opacities 12/31>> chest x-ray: Stable bilateral lung opacities 12/31>> lower extremity Doppler: No DVT 1/5>> chest x-ray: Mild improvement in multifocal opacities   COVID-19 medications: Steroids: 12/16>> Remdesivir: 12/16>> 12/20 Baricitinib: 12/17>>12/30  Antibiotics: None  Microbiology data: None  Procedures: None  Consults: PCCM  DVT prophylaxis: Lovenox at BID dosing    Subjective:   On heated high flow-40 L/min-this morning-I took his NRB off-O2 saturations remained in the 90s.  He appears the same-continues to have severe  exertional dyspnea with minimal movement.   Assessment  & Plan :   Acute Hypoxic Resp Failure due to Covid 19 Viral pneumonia: Continues to have severe hypoxemia-on H HFNC and NRB.  Suspect does not require NRB-but likes the "flow" of air-which provides him a reassurance.  Anxiety continues to play a big role in his dyspnea as well.  Have asked RN to see if we can minimize use of NRB and see if he can tolerate being just on HFNC-she is also aware that we need to slowly titrate his FiO2/flow requirement slowly.  Although he does not have signs of volume overload-we will give  1 dose of IV Lasix to ensure negative balance.  Remains on Lexapro and as needed Klonopin for his anxiety symptoms.  Continue to monitor closely-hopeful that we can slowly start titrating his FiO2 down over the next few days-given his tenuous clinical situation-I have elected to keep him in 5 W.-although I doubt he requires further isolation for Covid.  Fever: afebrile O2 requirements:  SpO2: 90 % O2 Flow Rate (L/min): 40 L/min FiO2 (%): 90 %   COVID-19 Labs: Recent Labs    10/16/20 0315 10/17/20 0219 10/18/20 0657 10/18/20 1256  DDIMER 0.95* 1.11*  --  2.41*  CRP 3.6* 4.3* 5.0*  --        Component Value Date/Time   BNP 53.8 10/11/2020 1024    No results for input(s): PROCALCITON in the last 168 hours.  Lab Results  Component Value Date   SARSCOV2NAA POSITIVE (A) 09/26/2020   [place] NEGATIVE 02/06/2020   [place] NEGATIVE 11/17/2019    Prone/Incentive Spirometry: encouraged  incentive spirometry use 3-4/hour.  Mildly elevated D-dimer: Secondary to COVID-19 related inflammation-hypoxia has not worsened-he remains stable-has been on prophylactic Lovenox since admission-CTA chest/lower extremity Dopplers have been negative.  Will change Lovenox to twice daily dosing-and monitor closely-if needed we will reattempt a CTA chest (attempted last week but patient unable to lie flat)-however I doubt  VTE  Acute on chronic diastolic heart failure: No evidence of volume overload-supportive care-keep in negative balance-continue to dose IV Lasix intermittently.  Hemoptysis: Isolated episode that occurred on 12/28-likely related to underlying parenchymal injury from COVID-19 pneumonia.  No further episodes since then.  Continue to monitor closely-if reoccurs-we will reconsult PCCM.  Transaminitis: Due to COVID-19-downtrending-follow periodically  CAD s/p PCI: No anginal symptoms-not on any antiplatelets at home.  Per patient report-PCI was approximately 11 years back  GERD: Continue PPI  Anxiety: Continues to have significant amount of anxiety-Lexapro increased to 10 mg daily-continue as needed Klonopin.  Constipation-continue MiraLAX  Obesity: Estimated body mass index is 30.1 kg/m as calculated from the following:   Height as of this encounter: 5\' 11"  (1.803 m).   Weight as of this encounter: 97.9 kg.    GI prophylaxis: PPI  ABG:    Component Value Date/Time   PHART 7.464 (H) 09/26/2020 2035   PCO2ART 31.7 (L) 09/26/2020 2035   PO2ART 90 09/26/2020 2035   HCO3 22.8 09/26/2020 2035   TCO2 24 09/26/2020 2035   ACIDBASEDEF 0.8 11/17/2019 1618   O2SAT 98.0 09/26/2020 2035    Vent Settings: N/A FiO2 (%):  [50 %-90 %] 90 %  Condition - Guarded  Family Communication  :  Sherlyn Lees Weisel [phone]- on 1/7  Code Status :  Full Code  Diet :  Diet Order            Diet Heart Room service appropriate? No; Fluid consistency: Thin; Fluid restriction: 2000 mL Fluid  Diet effective now                  Disposition Plan  :   Status is: Inpatient  Remains inpatient appropriate because:Inpatient level of care appropriate due to severity of illness   Dispo: The patient is from: Home              Anticipated d/c is to: Home              Anticipated d/c date is: > 3 days              Patient currently is not medically  stable to d/c.   Barriers to discharge:  Hypoxia requiring O2 supplementation  Antimicorbials  :    Anti-infectives (From admission, onward)   Start     Dose/Rate Route Frequency Ordered Stop   09/27/20 1000  remdesivir 100 mg in sodium chloride 0.9 % 100 mL IVPB       "Followed by" Linked Group Details   100 mg 200 mL/hr over 30 Minutes Intravenous Daily 09/26/20 2208 09/30/20 1900   09/26/20 2215  remdesivir 200 mg in sodium chloride 0.9% 250 mL IVPB       "Followed by" Linked Group Details   200 mg 580 mL/hr over 30 Minutes Intravenous Once 09/26/20 2208 09/27/20 0146      Inpatient Medications  Scheduled Meds: . benzonatate  200 mg Oral TID  . enoxaparin (LOVENOX) injection  50 mg Subcutaneous Daily  . escitalopram  10 mg Oral Daily  . feeding supplement  237 mL Oral BID BM  . melatonin  3 mg Oral QHS  . pantoprazole  40 mg Oral Daily  . polyethylene glycol  17 g Oral Daily  . predniSONE  40 mg Oral Q breakfast  . sodium chloride flush  10-40 mL Intracatheter Q12H  . sodium chloride flush  3 mL Intravenous Q12H   Continuous Infusions:  PRN Meds:.acetaminophen, albuterol, bisacodyl, clonazePAM, menthol-cetylpyridinium, ondansetron (ZOFRAN) IV, senna-docusate, sodium chloride, sodium chloride flush   Time Spent in minutes  35   See all Orders from today for further details   Oren Binet M.D on 10/18/2020 at 1:48 PM  To page go to [email or web address] - use universal password  [facility] -  Office  [phone]    Objective:   Vitals:   10/18/20 0817 10/18/20 0925 10/18/20 1247 10/18/20 1335  BP: (!) 98/56  (!) 142/74   Pulse: 92 94 96 91  Resp: 18  20 (!) 22  Temp: 98.3 F (36.8 C)  98.1 F (36.7 C)   TempSrc: Axillary  Axillary   SpO2: 95% 100% 100% 90%  Weight:      Height:        Wt Readings from Last 3 Encounters:  10/18/20 97.9 kg  12/04/19 108.4 kg  11/20/19 107.9 kg    No intake or output data in the 24 hours ending 10/18/20 1348   Physical Exam Gen Exam:Alert awake-not in  any distress HEENT:atraumatic, normocephalic Chest: B/L clear to auscultation anteriorly CVS:S1S2 regular Abdomen:soft non tender, non distended Extremities:no edema Neurology: Non focal Skin: no rash   Data Review:    CBC Recent Labs  Lab 10/14/20 0407 10/15/20 0420 10/16/20 0315 10/17/20 0219 10/18/20 0657  WBC 15.5* 15.9* 15.7* 17.4* 15.9*  HGB 13.3 13.0 13.1 13.7 14.6  HCT 38.2* 38.3* 37.6* 41.7 41.6  PLT 188 169 150 158 160  MCV 91.6 92.7 91.7 92.1 90.4  MCH 31.9 31.5 32.0 30.2 31.7  MCHC 34.8 33.9 34.8 32.9 35.1  RDW 13.2 13.2 13.2 13.2 13.2    Chemistries  Recent Labs  Lab 10/13/20 0410 10/14/20 0407 10/15/20 0420 10/16/20 0315 10/17/20 0219 10/18/20 0657  NA 135 136 136 135 137 137  K 4.4 4.4 4.6 4.5 4.7 3.9  CL 97* 99 99 101 98 97*  CO2 25 27 27 26 28 27   GLUCOSE 164* 176* 166* 174* 192* 129*  BUN 28* 31* 27* 24* 29* 29*  CREATININE 1.00 1.04 1.06 0.85 0.97 1.00  CALCIUM 8.4* 8.5* 8.6* 8.3* 8.7* 8.8*  MG 2.6*  --   --   --   --   --  AST 21 21 30  32 28 32  ALT 55* 57* 81* 115* 127* 123*  ALKPHOS 86 81 75 83 93 91  BILITOT 0.9 1.0 1.0 0.8 1.2 1.1   ------------------------------------------------------------------------------------------------------------------ No results for input(s): CHOL, HDL, LDLCALC, TRIG, CHOLHDL, LDLDIRECT in the last 72 hours.  No results found for: HGBA1C ------------------------------------------------------------------------------------------------------------------ No results for input(s): TSH, T4TOTAL, T3FREE, THYROIDAB in the last 72 hours.  Invalid input(s): FREET3 ------------------------------------------------------------------------------------------------------------------ No results for input(s): VITAMINB12, FOLATE, FERRITIN, TIBC, IRON, RETICCTPCT in the last 72 hours.  Coagulation profile No results for input(s): INR, PROTIME in the last 168 hours.  Recent Labs    10/17/20 0219 10/18/20 1256   DDIMER 1.11* 2.41*    Cardiac Enzymes No results for input(s): CKMB, TROPONINI, MYOGLOBIN in the last 168 hours.  Invalid input(s): CK ------------------------------------------------------------------------------------------------------------------    Component Value Date/Time   BNP 53.8 10/11/2020 1024    Micro Results No results found for this or any previous visit (from the past 240 hour(s)).  Radiology Reports CT ANGIO CHEST PE W OR WO CONTRAST  Result Date: 09/27/2020 CLINICAL DATA:  55 year old male with history of COVID-19, suspected pulmonary embolism. EXAM: CT ANGIOGRAPHY CHEST WITH CONTRAST TECHNIQUE: Multidetector CT imaging of the chest was performed using the standard protocol during bolus administration of intravenous contrast. Multiplanar CT image reconstructions and MIPs were obtained to evaluate the vascular anatomy. CONTRAST:  100 mL Omnipaque 350, intravenous COMPARISON:  Chest CT from 11/17/2019 FINDINGS: Cardiovascular: Satisfactory opacification of the pulmonary arteries to the segmental level. No evidence of pulmonary embolism. Normal heart size. No pericardial effusion. The heart is normal in size. No pericardial effusion. Severe coronary atherosclerotic calcifications. Mediastinum/Nodes: Similar appearing prominent right paratracheal lymph node measuring up to 1.2 cm in short axis. Otherwise no enlarged mediastinal, hilar, or axillary lymph nodes. Thyroid gland, trachea, and esophagus demonstrate no significant findings. Lungs/Pleura: Interval development of near diffuse, peripherally predominant ground-glass pulmonary opacities with a mid lung and basal predominance. Similar appearing moderate upper lobe predominant paraseptal and centrilobular emphysema. No pleural effusion or pneumothorax. Upper Abdomen: The visualized upper abdomen is within normal limits. Musculoskeletal: No chest wall abnormality. No acute or significant osseous findings. Review of the MIP images  confirms the above findings. IMPRESSION: Vascular: 1. No evidence of pulmonary embolism. 2. Severe coronary atherosclerotic calcifications. Non-Vascular: 1. Diffuse, mid lung and basilar, peripherally predominant ground-glass opacities - findings compatible with multifocal pneumonia associated with COVID-19. 2. Similar appearing moderate upper lobe predominant centrilobular and paraseptal emphysema. Electronically Signed   By: Ruthann Cancer MD   On: 09/27/2020 14:31   DG Chest Port 1 View  Result Date: 10/06/2020 CLINICAL DATA:  COVID positive, pleural effusion, pneumothorax EXAM: PORTABLE CHEST 1 VIEW COMPARISON:  09/26/2020 FINDINGS: Slight interval increase in diffuse bilateral heterogeneous and interstitial airspace opacity, most conspicuous at the lung bases. No significant pleural effusion noted. No pneumothorax. The heart and mediastinum are unremarkable. IMPRESSION: 1. Slight interval increase in diffuse bilateral heterogeneous and interstitial airspace opacity, most conspicuous at the lung bases, consistent with worsened COVID airspace disease. 2.  No significant pleural effusion noted. No pneumothorax. Electronically Signed   By: Eddie Candle M.D.   On: 10/06/2020 09:37   DG CHEST PORT 1 VIEW  Result Date: 09/26/2020 CLINICAL DATA:  COVID-19 EXAM: PORTABLE CHEST 1 VIEW COMPARISON:  09/26/2020 FINDINGS: Slight worsening of bilateral basilar predominant opacities. No pleural effusion or pneumothorax. Normal cardiomediastinal contours. IMPRESSION: Slight worsening of bilateral basilar predominant opacities. Electronically Signed   By: Lennette Bihari  [place] M.D.   On: 09/26/2020 23:26   DG Chest Portable 1 View  Result Date: 09/26/2020 CLINICAL DATA:  Cough and shortness of breath. EXAM: PORTABLE CHEST 1 VIEW COMPARISON:  12/04/2019 FINDINGS: Cardiomegaly which is increased from prior exam. Pulmonary edema, moderate in degree. There are bilateral pleural effusions. No confluent consolidation. No  pneumothorax. No acute osseous abnormalities are seen. IMPRESSION: CHF with cardiomegaly, pulmonary edema and bilateral pleural effusions. Electronically Signed   By: Keith Rake M.D.   On: 09/26/2020 19:14   DG Chest Port 1V same Day  Result Date: 10/16/2020 CLINICAL DATA:  COVID positive, short of breath EXAM: PORTABLE CHEST 1 VIEW COMPARISON:  None. FINDINGS: Normal cardiac silhouette. Bilateral peripheral fine airspace disease appears slightly less dense than comparison exam. No pneumothorax. No focal consolidation. IMPRESSION: Mild improvement in bilateral multifocal viral pneumonia. Electronically Signed   By: Suzy Bouchard M.D.   On: 10/16/2020 13:32   DG Chest Port 1V same Day  Result Date: 10/13/2020 CLINICAL DATA:  55 year old male with respiratory failure. Recent bronchoscopy and hemoptysis. History of COVID-19. EXAM: PORTABLE CHEST 1 VIEW COMPARISON:  Portable chest 10/11/2020 and earlier. FINDINGS: Portable AP semi upright view at 0554 hours. Lower lung volumes. Stable cardiac size and mediastinal contours. Visualized tracheal air column is within normal limits. Diffuse reticulonodular pulmonary opacity with confluent density in the peripheral right lung and at the lung bases now worse on the right. No pneumothorax. No definite pleural effusion. Stable visualized osseous structures. IMPRESSION: Continued diffuse reticulonodular opacity with lower lung volumes and worsening ventilation since 10/11/2020 at both lung bases. Electronically Signed   By: Genevie Ann M.D.   On: 10/13/2020 07:57   DG Chest Port 1V same Day  Result Date: 10/11/2020 CLINICAL DATA:  Hypoxia.  Shortness of breath. EXAM: PORTABLE CHEST 1 VIEW COMPARISON:  October 06, 2020. FINDINGS: The heart size and mediastinal contours are within normal limits. No pneumothorax or pleural effusion is noted. Stable bilateral lung opacities are noted concerning for multifocal pneumonia. The visualized skeletal structures are  unremarkable. IMPRESSION: Stable bilateral lung opacities are noted concerning for multifocal pneumonia. Electronically Signed   By: Marijo Conception M.D.   On: 10/11/2020 10:06   VAS [place] LOWER EXTREMITY VENOUS (DVT)  Result Date: 10/12/2020  Lower Venous DVT Study Indications: Worsening hypoxia, Covid-19.  Comparison Study: No prior studies. Performing Technologist: Darlin Coco, RDMS  Examination Guidelines: A complete evaluation includes B-mode imaging, spectral Doppler, color Doppler, and power Doppler as needed of all accessible portions of each vessel. Bilateral testing is considered an integral part of a complete examination. Limited examinations for reoccurring indications may be performed as noted. The reflux portion of the exam is performed with the patient in reverse Trendelenburg.  +---------+---------------+---------+-----------+----------+--------------+ RIGHT    CompressibilityPhasicitySpontaneityPropertiesThrombus Aging +---------+---------------+---------+-----------+----------+--------------+ CFV      Full           Yes      Yes                                 +---------+---------------+---------+-----------+----------+--------------+ SFJ      Full                                                        +---------+---------------+---------+-----------+----------+--------------+  FV Prox  Full                                                        +---------+---------------+---------+-----------+----------+--------------+ FV Mid   Full                                                        +---------+---------------+---------+-----------+----------+--------------+ FV DistalFull                                                        +---------+---------------+---------+-----------+----------+--------------+ PFV      Full                                                        +---------+---------------+---------+-----------+----------+--------------+  POP      Full           Yes      Yes                                 +---------+---------------+---------+-----------+----------+--------------+ PTV      Full                                                        +---------+---------------+---------+-----------+----------+--------------+ PERO     Full                                                        +---------+---------------+---------+-----------+----------+--------------+   +---------+---------------+---------+-----------+----------+--------------+ LEFT     CompressibilityPhasicitySpontaneityPropertiesThrombus Aging +---------+---------------+---------+-----------+----------+--------------+ CFV      Full           Yes      Yes                                 +---------+---------------+---------+-----------+----------+--------------+ SFJ      Full                                                        +---------+---------------+---------+-----------+----------+--------------+ FV Prox  Full                                                        +---------+---------------+---------+-----------+----------+--------------+  FV Mid   Full                                                        +---------+---------------+---------+-----------+----------+--------------+ FV DistalFull                                                        +---------+---------------+---------+-----------+----------+--------------+ PFV      Full                                                        +---------+---------------+---------+-----------+----------+--------------+ POP      Full           Yes      Yes                                 +---------+---------------+---------+-----------+----------+--------------+ PTV      Full                                                        +---------+---------------+---------+-----------+----------+--------------+ PERO     Full                                                         +---------+---------------+---------+-----------+----------+--------------+     Summary: RIGHT: - There is no evidence of deep vein thrombosis in the lower extremity.  - No cystic structure found in the popliteal fossa.  LEFT: - There is no evidence of deep vein thrombosis in the lower extremity.  - No cystic structure found in the popliteal fossa.  *See table(s) above for measurements and observations. Electronically signed by Ruta Hinds MD on 10/12/2020 at 10:00:37 AM.    Final    ECHOCARDIOGRAM LIMITED  Result Date: 09/28/2020    ECHOCARDIOGRAM LIMITED REPORT   Patient Name:   Cathy Loletha Grayer Dunford Date of Exam: 09/28/2020 Medical Rec #:  [id number]          Height:       71.0 in Accession #:    [id number]         Weight:       244.3 lb Date of Birth:  06-18-1966          BSA:          2.296 m Patient Age:    30 years           BP:           120/84 mmHg Patient Gender: M                  HR:  73 bpm. Exam Location:  Inpatient Procedure: Limited Echo, Limited Color Doppler and Cardiac Doppler Indications:    acute respiratory distress  History:        Patient has prior history of Echocardiogram examinations, most                 recent 03/30/2016. CAD; Covid.  Sonographer:    Johny Chess Referring Phys: [id number] Candace Gallus MELVIN  Sonographer Comments: Image acquisition challenging due to respiratory motion. IMPRESSIONS  1. Left ventricular ejection fraction, by estimation, is 60 to 65%. The left ventricle has normal function. The left ventricle has no regional wall motion abnormalities. There is mild left ventricular hypertrophy.  2. Right ventricular systolic function is normal. The right ventricular size is normal.  3. The mitral valve is normal in structure. No evidence of mitral valve regurgitation. No evidence of mitral stenosis.  4. The aortic valve is tricuspid.  5. The inferior vena cava is normal in size with greater than 50% respiratory variability,  suggesting right atrial pressure of 3 mmHg. FINDINGS  Left Ventricle: Left ventricular ejection fraction, by estimation, is 60 to 65%. The left ventricle has normal function. The left ventricle has no regional wall motion abnormalities. The left ventricular internal cavity size was normal in size. There is  mild left ventricular hypertrophy. Right Ventricle: The right ventricular size is normal. No increase in right ventricular wall thickness. Right ventricular systolic function is normal. Pericardium: There is no evidence of pericardial effusion. Mitral Valve: The mitral valve is normal in structure. No evidence of mitral valve stenosis. Tricuspid Valve: The tricuspid valve is normal in structure. Tricuspid valve regurgitation is not demonstrated. No evidence of tricuspid stenosis. Aortic Valve: The aortic valve is tricuspid. Pulmonic Valve: The pulmonic valve was not well visualized. Pulmonic valve regurgitation is not visualized. No evidence of pulmonic stenosis. Aorta: The aortic root is normal in size and structure. Pulmonary Artery: Indeterminant PASP, inadequate TR jet. Venous: The inferior vena cava is normal in size with greater than 50% respiratory variability, suggesting right atrial pressure of 3 mmHg. IAS/Shunts: No atrial level shunt detected by color flow Doppler. LEFT VENTRICLE PLAX 2D LVIDd:         4.20 cm LVIDs:         2.80 cm LV PW:         1.30 cm LV IVS:        1.10 cm LVOT diam:     2.00 cm LV SV:         71 LV SV Index:   31 LVOT Area:     3.14 cm  IVC IVC diam: 1.70 cm LEFT ATRIUM         Index LA diam:    4.30 cm 1.87 cm/m  AORTIC VALVE LVOT Vmax:   116.00 cm/s LVOT Vmean:  76.100 cm/s LVOT VTI:    0.227 m  AORTA Ao Root diam: 3.10 cm MITRAL VALVE MV Area (PHT): 2.91 cm    SHUNTS MV Decel Time: 261 msec    Systemic VTI:  0.23 m MV E velocity: 75.40 cm/s  Systemic Diam: 2.00 cm MV A velocity: 66.00 cm/s MV E/A ratio:  1.14 Carlyle Dolly MD Electronically signed by Carlyle Dolly MD  Signature Date/Time: 09/28/2020/2:52:33 PM    Final

## 2020-10-19 LAB — CBC
HCT: 39.5 % (ref 39.0–52.0)
Hemoglobin: 13.9 g/dL (ref 13.0–17.0)
MCH: 31.7 pg (ref 26.0–34.0)
MCHC: 35.2 g/dL (ref 30.0–36.0)
MCV: 90 fL (ref 80.0–100.0)
Platelets: 134 10*3/uL — ABNORMAL LOW (ref 150–400)
RBC: 4.39 MIL/uL (ref 4.22–5.81)
RDW: 13.2 % (ref 11.5–15.5)
WBC: 15.4 10*3/uL — ABNORMAL HIGH (ref 4.0–10.5)
nRBC: 0 % (ref 0.0–0.2)

## 2020-10-19 LAB — D-DIMER, QUANTITATIVE: D-Dimer, Quant: 1.33 ug/mL-FEU — ABNORMAL HIGH (ref 0.00–0.50)

## 2020-10-19 LAB — COMPREHENSIVE METABOLIC PANEL
ALT: 129 U/L — ABNORMAL HIGH (ref 0–44)
AST: 30 U/L (ref 15–41)
Albumin: 2.6 g/dL — ABNORMAL LOW (ref 3.5–5.0)
Alkaline Phosphatase: 85 U/L (ref 38–126)
Anion gap: 13 (ref 5–15)
BUN: 27 mg/dL — ABNORMAL HIGH (ref 6–20)
CO2: 24 mmol/L (ref 22–32)
Calcium: 8.3 mg/dL — ABNORMAL LOW (ref 8.9–10.3)
Chloride: 98 mmol/L (ref 98–111)
Creatinine, Ser: 0.99 mg/dL (ref 0.61–1.24)
GFR, Estimated: >60 mL/min (ref >60)
Glucose, Bld: 168 mg/dL — ABNORMAL HIGH (ref 70–99)
Potassium: 3.4 mmol/L — ABNORMAL LOW (ref 3.5–5.1)
Sodium: 135 mmol/L (ref 135–145)
Total Bilirubin: 1.2 mg/dL (ref 0.3–1.2)
Total Protein: 6.6 g/dL (ref 6.5–8.1)

## 2020-10-19 LAB — C-REACTIVE PROTEIN: CRP: 11.9 mg/dL — ABNORMAL HIGH (ref <1.0)

## 2020-10-19 MED ORDER — CLONAZEPAM 0.5 MG PO TABS
1.0000 mg | ORAL_TABLET | Freq: Three times a day (TID) | ORAL | Status: DC | PRN
  Administered 2020-10-19 – 2020-11-03 (×36): 1 mg via ORAL
  Filled 2020-10-19 (×6): qty 1
  Filled 2020-10-19: qty 2
  Filled 2020-10-19 (×3): qty 1
  Filled 2020-10-19: qty 2
  Filled 2020-10-19 (×10): qty 1
  Filled 2020-10-19: qty 2
  Filled 2020-10-19 (×2): qty 1
  Filled 2020-10-19 (×2): qty 2
  Filled 2020-10-19 (×10): qty 1

## 2020-10-19 MED ORDER — ADULT MULTIVITAMIN W/MINERALS CH
1.0000 | ORAL_TABLET | Freq: Every day | ORAL | Status: DC
  Administered 2020-10-19 – 2020-10-23 (×5): 1 via ORAL
  Filled 2020-10-19 (×5): qty 1

## 2020-10-19 MED ORDER — POTASSIUM CHLORIDE CRYS ER 20 MEQ PO TBCR
40.0000 meq | EXTENDED_RELEASE_TABLET | Freq: Once | ORAL | Status: AC
  Administered 2020-10-19: 40 meq via ORAL
  Filled 2020-10-19: qty 2

## 2020-10-19 NOTE — Progress Notes (Signed)
   10/18/20 2312  Assess: MEWS Score  Temp 97.9 F (36.6 C)  BP 95/67  Pulse Rate 83  ECG Heart Rate 80  Resp (!) 22  Level of Consciousness Alert  SpO2 95 %  O2 Device HFNC  Patient Activity (if Appropriate) In bed  Heater temperature 90 F (32.2 C)  O2 Flow Rate (L/min) 45 L/min  FiO2 (%) 100 %  Assess: MEWS Score  MEWS Temp 0  MEWS Systolic 1  MEWS Pulse 0  MEWS RR 1  MEWS LOC 0  MEWS Score 2  MEWS Score Color Yellow  Assess: if the MEWS score is Yellow or Red  Were vital signs taken at a resting state? Yes  Focused Assessment No change from prior assessment  Early Detection of Sepsis Score *See Row Information* Low  MEWS guidelines implemented *See Row Information* Yes  Treat  Pain Score Asleep  Take Vital Signs  Increase Vital Sign Frequency  Yellow: Q 2hr X 2 then Q 4hr X 2, if remains yellow, continue Q 4hrs  Escalate  MEWS: Escalate Yellow: discuss with charge nurse/RN and consider discussing with provider and RRT  Notify: Charge Nurse/RN  Name of Charge Nurse/RN Notified Selinda Orion., RN  Date Charge Nurse/RN Notified 10/19/19  Time Charge Nurse/RN Notified 0002  Document  Patient Outcome Other (Comment) (remains tachypneic)  Progress note created (see row info) Yes  Patient fired yellow MEWS due to continuous tachypnea.

## 2020-10-19 NOTE — Progress Notes (Signed)
Initial Nutrition Assessment  INTERVENTION:   -D/c Ensure -not drinking  -Magic cup TID with meals, each supplement provides 290 kcal and 9 grams of protein  -Multivitamin with minerals daily  NUTRITION DIAGNOSIS:   Increased nutrient needs related to acute illness (COVID-19 pneumonia) as evidenced by estimated needs.  GOAL:   Patient will meet greater than or equal to 90% of their needs  MONITOR:   PO intake,Supplement acceptance,Labs,Weight trends,I & O's  REASON FOR ASSESSMENT:   Malnutrition Screening Tool    ASSESSMENT:   55 y.o. male with PMHx of  CAD s/p PCI, tobacco use, HLD, GERD-who presented with shortness of breath-found to have acute hypoxic respiratory failure requiring 100% O2 via NRB due to COVID-19 pneumonia.  Patient has been COVID-19+ since 12/16. Per chart review, pt was having diarrhea PTA. Pt continues to have severe hypoxemia requiring O2.  Pt has been consuming 100% of meals on 1/6 and 1/7. This morning did not eat breakfast.  Pt has been ordered Ensure supplements but not drinking them. Will d/c and order Magic cups with meals.  Admission weight: 244 lbs. Current weight: 210 lbs. Weight changes of -34 lbs since admission.  Medications: KLOR-CON  Labs reviewed: Low K  NUTRITION - FOCUSED PHYSICAL EXAM:  Unable to complete  Diet Order:   Diet Order            Diet Heart Room service appropriate? No; Fluid consistency: Thin; Fluid restriction: 2000 mL Fluid  Diet effective now                 EDUCATION NEEDS:   No education needs have been identified at this time  Skin:  Skin Assessment: Reviewed RN Assessment  Last BM:  1/8  Height:   Ht Readings from Last 1 Encounters:  09/27/20 5\' 11"  (1.803 m)    Weight:   Wt Readings from Last 1 Encounters:  10/19/20 95.4 kg    BMI:  Body mass index is 29.33 kg/m.  Estimated Nutritional Needs:   Kcal:  2300-2500  Protein:  110-120g  Fluid:  2L/day   Clayton Bibles,  MS, RD, LDN Inpatient Clinical Dietitian Contact information available via Amion

## 2020-10-19 NOTE — Progress Notes (Signed)
PROGRESS NOTE                                                                                                                                                                                                             Patient Demographics:    Tannar Watkin, is a 55 y.o. male, DOB - 1966-09-21, ZL:[id number]  Outpatient Primary MD for the patient is Patient, No Pcp Per   Admit date - 09/26/2020   LOS - 23  No chief complaint on file.      Brief Narrative: Patient is a 55 y.o. male with PMHx of  CAD s/p PCI, tobacco use, HLD, GERD-who presented with shortness of breath-found to have acute hypoxic respiratory failure requiring 100% O2 via NRB due to COVID-19 pneumonia.  COVID-19 vaccinated status: Unvaccinated  Significant Events: 12/9>> started having GI symptoms including diarrhea 12/13>> to urgent care-for diarrhea-discharged after supportive care/IV fluids 12/16>> Admit to [facility] for hypoxia due to COVID-19 pneumonia-requiring 100% O2 via NRB 12/28>> hemoptysis-PCCM consulted-supportive care recommended. 12/31>> worsening hypoxemia-started on heated high flow-unable to lie flat for a CTA chest  Significant studies: 12/17>> CTA chest: No PE, diffuse groundglass opacities 12/18>> Echo: EF 60-65% 12/26>> chest x-ray: Diffuse bilateral heterogeneous/interstitial airspace opacities 12/31>> chest x-ray: Stable bilateral lung opacities 12/31>> lower extremity Doppler: No DVT 1/5>> chest x-ray: Mild improvement in multifocal opacities   COVID-19 medications: Steroids: 12/16>> Remdesivir: 12/16>> 12/20 Baricitinib: 12/17>>12/30  Antibiotics: None  Microbiology data: None  Procedures: None  Consults: PCCM  DVT prophylaxis: Lovenox at BID dosing    Subjective:   Was on both heated high flow and NRB when I saw him this morning-O2 saturations were around almost 100%.  I proceeded to take the NRB off-and  just kept him on heated high flow-O2 saturations did not drop below 94% when I was in the room.  He continues to have significant anxiety symptoms-things are worse especially in the mornings when he has to get out of bed to chair.   Assessment  & Plan :   Acute Hypoxic Resp Failure due to Covid 19 Viral pneumonia: Continues to have severe hypoxemia-on H HFNC and NRB.  Suspect does not require NRB-but likes the "flow" of air-which provides him a reassurance.  Anxiety continues to play a big role in his dyspnea as well.  Discussed again with RN to  minimize use of NRB-see if he can just tolerate being on heated high flow-and continued attempts to slowly titrate down FiO2.  Continue slow taper down off prednisone-remains on Lexapro and Klonopin for anxiety-hoping that once we are able to slowly start titrating down his FiO2 a bit more-he can be transferred out to a non-Covid unit.  Fever: afebrile O2 requirements:  SpO2: 96 % O2 Flow Rate (L/min): 50 L/min FiO2 (%): 100 %   COVID-19 Labs: Recent Labs    10/17/20 0219 10/18/20 0657 10/18/20 1256 10/19/20 0215  DDIMER 1.11*  --  2.41* 1.33*  CRP 4.3* 5.0*  --  11.9*       Component Value Date/Time   BNP 53.8 10/11/2020 1024    No results for input(s): PROCALCITON in the last 168 hours.  Lab Results  Component Value Date   SARSCOV2NAA POSITIVE (A) 09/26/2020   [place] NEGATIVE 02/06/2020   [place] NEGATIVE 11/17/2019    Prone/Incentive Spirometry: encouraged  incentive spirometry use 3-4/hour.  Mildly elevated D-dimer: Secondary to COVID-19 related inflammation-hypoxia has not worsened-he remains stable-has been on prophylactic Lovenox since admission-CTA chest/lower extremity Dopplers have been negative.  On twice daily dosing of Lovenox.  Acute on chronic diastolic heart failure: No evidence of volume overload-supportive care-keep in negative balance-continue to dose IV Lasix intermittently.  Hemoptysis: Isolated episode  that occurred on 12/28-likely related to underlying parenchymal injury from COVID-19 pneumonia.  No further episodes since then.  Continue to monitor closely-if reoccurs-we will reconsult PCCM.  Transaminitis: Due to COVID-19-downtrending-follow periodically  CAD s/p PCI: No anginal symptoms-not on any antiplatelets at home.  Per patient report-PCI was approximately 11 years back  GERD: Continue PPI  Anxiety: Continues to have significant amount of anxiety-Lexapro increased to 10 mg daily a few days back-increase Klonopin to 1 mg 3 times daily as needed.    Constipation-continue MiraLAX  Obesity: Estimated body mass index is 29.33 kg/m as calculated from the following:   Height as of this encounter: 5\' 11"  (1.803 m).   Weight as of this encounter: 95.4 kg.    GI prophylaxis: PPI  ABG:    Component Value Date/Time   PHART 7.464 (H) 09/26/2020 2035   PCO2ART 31.7 (L) 09/26/2020 2035   PO2ART 90 09/26/2020 2035   HCO3 22.8 09/26/2020 2035   TCO2 24 09/26/2020 2035   ACIDBASEDEF 0.8 11/17/2019 1618   O2SAT 98.0 09/26/2020 2035    Vent Settings: N/A FiO2 (%):  [90 %-100 %] 100 %  Condition - Guarded  Family Communication  :  Spouse-Diana Lentsch [phone] was on the floor this morning-I spoke to her.  Code Status :  Full Code  Diet :  Diet Order            Diet Heart Room service appropriate? No; Fluid consistency: Thin; Fluid restriction: 2000 mL Fluid  Diet effective now                  Disposition Plan  :   Status is: Inpatient  Remains inpatient appropriate because:Inpatient level of care appropriate due to severity of illness   Dispo: The patient is from: Home              Anticipated d/c is to: Home              Anticipated d/c date is: > 3 days              Patient currently is not medically stable to d/c.   Barriers  to discharge: Hypoxia requiring O2 supplementation  Antimicorbials  :    Anti-infectives (From admission, onward)    Start     Dose/Rate Route Frequency Ordered Stop   09/27/20 1000  remdesivir 100 mg in sodium chloride 0.9 % 100 mL IVPB       "Followed by" Linked Group Details   100 mg 200 mL/hr over 30 Minutes Intravenous Daily 09/26/20 2208 09/30/20 1900   09/26/20 2215  remdesivir 200 mg in sodium chloride 0.9% 250 mL IVPB       "Followed by" Linked Group Details   200 mg 580 mL/hr over 30 Minutes Intravenous Once 09/26/20 2208 09/27/20 0146      Inpatient Medications  Scheduled Meds: . benzonatate  200 mg Oral TID  . enoxaparin (LOVENOX) injection  50 mg Subcutaneous Q12H  . escitalopram  10 mg Oral Daily  . feeding supplement  237 mL Oral BID BM  . melatonin  3 mg Oral QHS  . multivitamin with minerals  1 tablet Oral Daily  . pantoprazole  40 mg Oral Daily  . polyethylene glycol  17 g Oral Daily  . predniSONE  40 mg Oral Q breakfast  . sodium chloride flush  10-40 mL Intracatheter Q12H  . sodium chloride flush  3 mL Intravenous Q12H   Continuous Infusions:  PRN Meds:.acetaminophen, albuterol, bisacodyl, clonazePAM, menthol-cetylpyridinium, ondansetron (ZOFRAN) IV, senna-docusate, sodium chloride, sodium chloride flush   Time Spent in minutes  35   See all Orders from today for further details   Oren Binet M.D on 10/19/2020 at 2:39 PM  To page go to [email or web address] - use universal password  [facility] -  Office  [phone]    Objective:   Vitals:   10/19/20 0707 10/19/20 0826 10/19/20 1115 10/19/20 1300  BP:   109/73   Pulse:  82 91 92  Resp:      Temp:   (!) 97.4 F (36.3 C)   TempSrc:   Oral   SpO2: 100% 96% 92% 96%  Weight:      Height:        Wt Readings from Last 3 Encounters:  10/19/20 95.4 kg  12/04/19 108.4 kg  11/20/19 107.9 kg     Intake/Output Summary (Last 24 hours) at 10/19/2020 1439 Last data filed at 10/19/2020 0900 Gross per 24 hour  Intake 120 ml  Output 350 ml  Net -230 ml     Physical Exam Gen Exam:Alert awake-not in any  distress HEENT:atraumatic, normocephalic Chest: B/L clear to auscultation anteriorly CVS:S1S2 regular Abdomen:soft non tender, non distended Extremities:no edema Neurology: Non focal Skin: no rash   Data Review:    CBC Recent Labs  Lab 10/15/20 0420 10/16/20 0315 10/17/20 0219 10/18/20 0657 10/19/20 0215  WBC 15.9* 15.7* 17.4* 15.9* 15.4*  HGB 13.0 13.1 13.7 14.6 13.9  HCT 38.3* 37.6* 41.7 41.6 39.5  PLT 169 150 158 160 134*  MCV 92.7 91.7 92.1 90.4 90.0  MCH 31.5 32.0 30.2 31.7 31.7  MCHC 33.9 34.8 32.9 35.1 35.2  RDW 13.2 13.2 13.2 13.2 13.2    Chemistries  Recent Labs  Lab 10/13/20 0410 10/14/20 0407 10/15/20 0420 10/16/20 0315 10/17/20 0219 10/18/20 0657 10/19/20 0215  NA 135   < > 136 135 137 137 135  K 4.4   < > 4.6 4.5 4.7 3.9 3.4*  CL 97*   < > 99 101 98 97* 98  CO2 25   < > 27 26 28 27 24   GLUCOSE 164*   < >  166* 174* 192* 129* 168*  BUN 28*   < > 27* 24* 29* 29* 27*  CREATININE 1.00   < > 1.06 0.85 0.97 1.00 0.99  CALCIUM 8.4*   < > 8.6* 8.3* 8.7* 8.8* 8.3*  MG 2.6*  --   --   --   --   --   --   AST 21   < > 30 32 28 32 30  ALT 55*   < > 81* 115* 127* 123* 129*  ALKPHOS 86   < > 75 83 93 91 85  BILITOT 0.9   < > 1.0 0.8 1.2 1.1 1.2   < > = values in this interval not displayed.   ------------------------------------------------------------------------------------------------------------------ No results for input(s): CHOL, HDL, LDLCALC, TRIG, CHOLHDL, LDLDIRECT in the last 72 hours.  No results found for: HGBA1C ------------------------------------------------------------------------------------------------------------------ No results for input(s): TSH, T4TOTAL, T3FREE, THYROIDAB in the last 72 hours.  Invalid input(s): FREET3 ------------------------------------------------------------------------------------------------------------------ No results for input(s): VITAMINB12, FOLATE, FERRITIN, TIBC, IRON, RETICCTPCT in the last 72  hours.  Coagulation profile No results for input(s): INR, PROTIME in the last 168 hours.  Recent Labs    10/18/20 1256 10/19/20 0215  DDIMER 2.41* 1.33*    Cardiac Enzymes No results for input(s): CKMB, TROPONINI, MYOGLOBIN in the last 168 hours.  Invalid input(s): CK ------------------------------------------------------------------------------------------------------------------    Component Value Date/Time   BNP 53.8 10/11/2020 1024    Micro Results No results found for this or any previous visit (from the past 240 hour(s)).  Radiology Reports CT ANGIO CHEST PE W OR WO CONTRAST  Result Date: 09/27/2020 CLINICAL DATA:  55 year old male with history of COVID-19, suspected pulmonary embolism. EXAM: CT ANGIOGRAPHY CHEST WITH CONTRAST TECHNIQUE: Multidetector CT imaging of the chest was performed using the standard protocol during bolus administration of intravenous contrast. Multiplanar CT image reconstructions and MIPs were obtained to evaluate the vascular anatomy. CONTRAST:  100 mL Omnipaque 350, intravenous COMPARISON:  Chest CT from 11/17/2019 FINDINGS: Cardiovascular: Satisfactory opacification of the pulmonary arteries to the segmental level. No evidence of pulmonary embolism. Normal heart size. No pericardial effusion. The heart is normal in size. No pericardial effusion. Severe coronary atherosclerotic calcifications. Mediastinum/Nodes: Similar appearing prominent right paratracheal lymph node measuring up to 1.2 cm in short axis. Otherwise no enlarged mediastinal, hilar, or axillary lymph nodes. Thyroid gland, trachea, and esophagus demonstrate no significant findings. Lungs/Pleura: Interval development of near diffuse, peripherally predominant ground-glass pulmonary opacities with a mid lung and basal predominance. Similar appearing moderate upper lobe predominant paraseptal and centrilobular emphysema. No pleural effusion or pneumothorax. Upper Abdomen: The visualized upper  abdomen is within normal limits. Musculoskeletal: No chest wall abnormality. No acute or significant osseous findings. Review of the MIP images confirms the above findings. IMPRESSION: Vascular: 1. No evidence of pulmonary embolism. 2. Severe coronary atherosclerotic calcifications. Non-Vascular: 1. Diffuse, mid lung and basilar, peripherally predominant ground-glass opacities - findings compatible with multifocal pneumonia associated with COVID-19. 2. Similar appearing moderate upper lobe predominant centrilobular and paraseptal emphysema. Electronically Signed   By: Ruthann Cancer MD   On: 09/27/2020 14:31   DG Chest Port 1 View  Result Date: 10/06/2020 CLINICAL DATA:  COVID positive, pleural effusion, pneumothorax EXAM: PORTABLE CHEST 1 VIEW COMPARISON:  09/26/2020 FINDINGS: Slight interval increase in diffuse bilateral heterogeneous and interstitial airspace opacity, most conspicuous at the lung bases. No significant pleural effusion noted. No pneumothorax. The heart and mediastinum are unremarkable. IMPRESSION: 1. Slight interval increase in diffuse bilateral heterogeneous and interstitial  airspace opacity, most conspicuous at the lung bases, consistent with worsened COVID airspace disease. 2.  No significant pleural effusion noted. No pneumothorax. Electronically Signed   By: Eddie Candle M.D.   On: 10/06/2020 09:37   DG CHEST PORT 1 VIEW  Result Date: 09/26/2020 CLINICAL DATA:  COVID-19 EXAM: PORTABLE CHEST 1 VIEW COMPARISON:  09/26/2020 FINDINGS: Slight worsening of bilateral basilar predominant opacities. No pleural effusion or pneumothorax. Normal cardiomediastinal contours. IMPRESSION: Slight worsening of bilateral basilar predominant opacities. Electronically Signed   By: Ulyses Jarred M.D.   On: 09/26/2020 23:26   DG Chest Portable 1 View  Result Date: 09/26/2020 CLINICAL DATA:  Cough and shortness of breath. EXAM: PORTABLE CHEST 1 VIEW COMPARISON:  12/04/2019 FINDINGS: Cardiomegaly which  is increased from prior exam. Pulmonary edema, moderate in degree. There are bilateral pleural effusions. No confluent consolidation. No pneumothorax. No acute osseous abnormalities are seen. IMPRESSION: CHF with cardiomegaly, pulmonary edema and bilateral pleural effusions. Electronically Signed   By: Keith Rake M.D.   On: 09/26/2020 19:14   DG Chest Port 1V same Day  Result Date: 10/16/2020 CLINICAL DATA:  COVID positive, short of breath EXAM: PORTABLE CHEST 1 VIEW COMPARISON:  None. FINDINGS: Normal cardiac silhouette. Bilateral peripheral fine airspace disease appears slightly less dense than comparison exam. No pneumothorax. No focal consolidation. IMPRESSION: Mild improvement in bilateral multifocal viral pneumonia. Electronically Signed   By: Suzy Bouchard M.D.   On: 10/16/2020 13:32   DG Chest Port 1V same Day  Result Date: 10/13/2020 CLINICAL DATA:  55 year old male with respiratory failure. Recent bronchoscopy and hemoptysis. History of COVID-19. EXAM: PORTABLE CHEST 1 VIEW COMPARISON:  Portable chest 10/11/2020 and earlier. FINDINGS: Portable AP semi upright view at 0554 hours. Lower lung volumes. Stable cardiac size and mediastinal contours. Visualized tracheal air column is within normal limits. Diffuse reticulonodular pulmonary opacity with confluent density in the peripheral right lung and at the lung bases now worse on the right. No pneumothorax. No definite pleural effusion. Stable visualized osseous structures. IMPRESSION: Continued diffuse reticulonodular opacity with lower lung volumes and worsening ventilation since 10/11/2020 at both lung bases. Electronically Signed   By: Genevie Ann M.D.   On: 10/13/2020 07:57   DG Chest Port 1V same Day  Result Date: 10/11/2020 CLINICAL DATA:  Hypoxia.  Shortness of breath. EXAM: PORTABLE CHEST 1 VIEW COMPARISON:  October 06, 2020. FINDINGS: The heart size and mediastinal contours are within normal limits. No pneumothorax or pleural effusion  is noted. Stable bilateral lung opacities are noted concerning for multifocal pneumonia. The visualized skeletal structures are unremarkable. IMPRESSION: Stable bilateral lung opacities are noted concerning for multifocal pneumonia. Electronically Signed   By: Marijo Conception M.D.   On: 10/11/2020 10:06   VAS [place] LOWER EXTREMITY VENOUS (DVT)  Result Date: 10/12/2020  Lower Venous DVT Study Indications: Worsening hypoxia, Covid-19.  Comparison Study: No prior studies. Performing Technologist: Darlin Coco, RDMS  Examination Guidelines: A complete evaluation includes B-mode imaging, spectral Doppler, color Doppler, and power Doppler as needed of all accessible portions of each vessel. Bilateral testing is considered an integral part of a complete examination. Limited examinations for reoccurring indications may be performed as noted. The reflux portion of the exam is performed with the patient in reverse Trendelenburg.  +---------+---------------+---------+-----------+----------+--------------+ RIGHT    CompressibilityPhasicitySpontaneityPropertiesThrombus Aging +---------+---------------+---------+-----------+----------+--------------+ CFV      Full           Yes      Yes                                 +---------+---------------+---------+-----------+----------+--------------+  SFJ      Full                                                        +---------+---------------+---------+-----------+----------+--------------+ FV Prox  Full                                                        +---------+---------------+---------+-----------+----------+--------------+ FV Mid   Full                                                        +---------+---------------+---------+-----------+----------+--------------+ FV DistalFull                                                        +---------+---------------+---------+-----------+----------+--------------+ PFV      Full                                                         +---------+---------------+---------+-----------+----------+--------------+ POP      Full           Yes      Yes                                 +---------+---------------+---------+-----------+----------+--------------+ PTV      Full                                                        +---------+---------------+---------+-----------+----------+--------------+ PERO     Full                                                        +---------+---------------+---------+-----------+----------+--------------+   +---------+---------------+---------+-----------+----------+--------------+ LEFT     CompressibilityPhasicitySpontaneityPropertiesThrombus Aging +---------+---------------+---------+-----------+----------+--------------+ CFV      Full           Yes      Yes                                 +---------+---------------+---------+-----------+----------+--------------+ SFJ      Full                                                        +---------+---------------+---------+-----------+----------+--------------+  FV Prox  Full                                                        +---------+---------------+---------+-----------+----------+--------------+ FV Mid   Full                                                        +---------+---------------+---------+-----------+----------+--------------+ FV DistalFull                                                        +---------+---------------+---------+-----------+----------+--------------+ PFV      Full                                                        +---------+---------------+---------+-----------+----------+--------------+ POP      Full           Yes      Yes                                 +---------+---------------+---------+-----------+----------+--------------+ PTV      Full                                                         +---------+---------------+---------+-----------+----------+--------------+ PERO     Full                                                        +---------+---------------+---------+-----------+----------+--------------+     Summary: RIGHT: - There is no evidence of deep vein thrombosis in the lower extremity.  - No cystic structure found in the popliteal fossa.  LEFT: - There is no evidence of deep vein thrombosis in the lower extremity.  - No cystic structure found in the popliteal fossa.  *See table(s) above for measurements and observations. Electronically signed by Ruta Hinds MD on 10/12/2020 at 10:00:37 AM.    Final    ECHOCARDIOGRAM LIMITED  Result Date: 09/28/2020    ECHOCARDIOGRAM LIMITED REPORT   Patient Name:   Carnell C Kosmicki Date of Exam: 09/28/2020 Medical Rec #:  [id number]          Height:       71.0 in Accession #:    [id number]         Weight:       244.3 lb Date of Birth:  26-Dec-1965          BSA:  2.296 m Patient Age:    65 years           BP:           120/84 mmHg Patient Gender: M                  HR:           73 bpm. Exam Location:  Inpatient Procedure: Limited Echo, Limited Color Doppler and Cardiac Doppler Indications:    acute respiratory distress  History:        Patient has prior history of Echocardiogram examinations, most                 recent 03/30/2016. CAD; Covid.  Sonographer:    Johny Chess Referring Phys: [id number] Candace Gallus MELVIN  Sonographer Comments: Image acquisition challenging due to respiratory motion. IMPRESSIONS  1. Left ventricular ejection fraction, by estimation, is 60 to 65%. The left ventricle has normal function. The left ventricle has no regional wall motion abnormalities. There is mild left ventricular hypertrophy.  2. Right ventricular systolic function is normal. The right ventricular size is normal.  3. The mitral valve is normal in structure. No evidence of mitral valve regurgitation. No evidence of mitral stenosis.  4. The  aortic valve is tricuspid.  5. The inferior vena cava is normal in size with greater than 50% respiratory variability, suggesting right atrial pressure of 3 mmHg. FINDINGS  Left Ventricle: Left ventricular ejection fraction, by estimation, is 60 to 65%. The left ventricle has normal function. The left ventricle has no regional wall motion abnormalities. The left ventricular internal cavity size was normal in size. There is  mild left ventricular hypertrophy. Right Ventricle: The right ventricular size is normal. No increase in right ventricular wall thickness. Right ventricular systolic function is normal. Pericardium: There is no evidence of pericardial effusion. Mitral Valve: The mitral valve is normal in structure. No evidence of mitral valve stenosis. Tricuspid Valve: The tricuspid valve is normal in structure. Tricuspid valve regurgitation is not demonstrated. No evidence of tricuspid stenosis. Aortic Valve: The aortic valve is tricuspid. Pulmonic Valve: The pulmonic valve was not well visualized. Pulmonic valve regurgitation is not visualized. No evidence of pulmonic stenosis. Aorta: The aortic root is normal in size and structure. Pulmonary Artery: Indeterminant PASP, inadequate TR jet. Venous: The inferior vena cava is normal in size with greater than 50% respiratory variability, suggesting right atrial pressure of 3 mmHg. IAS/Shunts: No atrial level shunt detected by color flow Doppler. LEFT VENTRICLE PLAX 2D LVIDd:         4.20 cm LVIDs:         2.80 cm LV PW:         1.30 cm LV IVS:        1.10 cm LVOT diam:     2.00 cm LV SV:         71 LV SV Index:   31 LVOT Area:     3.14 cm  IVC IVC diam: 1.70 cm LEFT ATRIUM         Index LA diam:    4.30 cm 1.87 cm/m  AORTIC VALVE LVOT Vmax:   116.00 cm/s LVOT Vmean:  76.100 cm/s LVOT VTI:    0.227 m  AORTA Ao Root diam: 3.10 cm MITRAL VALVE MV Area (PHT): 2.91 cm    SHUNTS MV Decel Time: 261 msec    Systemic VTI:  0.23 m MV E velocity: 75.40 cm/s  Systemic Diam:  2.00  cm MV A velocity: 66.00 cm/s MV E/A ratio:  1.14 Carlyle Dolly MD Electronically signed by Carlyle Dolly MD Signature Date/Time: 09/28/2020/2:52:33 PM    Final

## 2020-10-20 LAB — CBC
HCT: 36.9 % — ABNORMAL LOW (ref 39.0–52.0)
Hemoglobin: 12.9 g/dL — ABNORMAL LOW (ref 13.0–17.0)
MCH: 31.6 pg (ref 26.0–34.0)
MCHC: 35 g/dL (ref 30.0–36.0)
MCV: 90.4 fL (ref 80.0–100.0)
Platelets: 153 10*3/uL (ref 150–400)
RBC: 4.08 MIL/uL — ABNORMAL LOW (ref 4.22–5.81)
RDW: 13.4 % (ref 11.5–15.5)
WBC: 16.6 10*3/uL — ABNORMAL HIGH (ref 4.0–10.5)
nRBC: 0 % (ref 0.0–0.2)

## 2020-10-20 LAB — COMPREHENSIVE METABOLIC PANEL
ALT: 96 U/L — ABNORMAL HIGH (ref 0–44)
AST: 21 U/L (ref 15–41)
Albumin: 2.5 g/dL — ABNORMAL LOW (ref 3.5–5.0)
Alkaline Phosphatase: 86 U/L (ref 38–126)
Anion gap: 11 (ref 5–15)
BUN: 27 mg/dL — ABNORMAL HIGH (ref 6–20)
CO2: 24 mmol/L (ref 22–32)
Calcium: 8.5 mg/dL — ABNORMAL LOW (ref 8.9–10.3)
Chloride: 100 mmol/L (ref 98–111)
Creatinine, Ser: 0.9 mg/dL (ref 0.61–1.24)
GFR, Estimated: >60 mL/min (ref >60)
Glucose, Bld: 126 mg/dL — ABNORMAL HIGH (ref 70–99)
Potassium: 3.6 mmol/L (ref 3.5–5.1)
Sodium: 135 mmol/L (ref 135–145)
Total Bilirubin: 1 mg/dL (ref 0.3–1.2)
Total Protein: 6.6 g/dL (ref 6.5–8.1)

## 2020-10-20 LAB — D-DIMER, QUANTITATIVE: D-Dimer, Quant: 1.71 ug/mL-FEU — ABNORMAL HIGH (ref 0.00–0.50)

## 2020-10-20 LAB — C-REACTIVE PROTEIN: CRP: 12.5 mg/dL — ABNORMAL HIGH (ref <1.0)

## 2020-10-20 MED ORDER — GUAIFENESIN 100 MG/5ML PO SOLN
5.0000 mL | Freq: Once | ORAL | Status: AC
  Administered 2020-10-20: 100 mg via ORAL
  Filled 2020-10-20: qty 25

## 2020-10-20 MED ORDER — LORAZEPAM 2 MG/ML IJ SOLN
1.0000 mg | Freq: Once | INTRAMUSCULAR | Status: AC
  Administered 2020-10-20: 1 mg via INTRAVENOUS
  Filled 2020-10-20: qty 1

## 2020-10-20 MED ORDER — ESCITALOPRAM OXALATE 10 MG PO TABS
15.0000 mg | ORAL_TABLET | Freq: Every day | ORAL | Status: DC
Start: 2020-10-21 — End: 2020-10-24
  Administered 2020-10-21 – 2020-10-24 (×4): 15 mg via ORAL
  Filled 2020-10-20 (×4): qty 2

## 2020-10-20 MED ORDER — PREDNISONE 20 MG PO TABS
30.0000 mg | ORAL_TABLET | Freq: Every day | ORAL | Status: DC
  Administered 2020-10-21 – 2020-10-22 (×2): 30 mg via ORAL
  Filled 2020-10-20 (×2): qty 1

## 2020-10-20 MED ORDER — GUAIFENESIN-DM 100-10 MG/5ML PO SYRP
5.0000 mL | ORAL_SOLUTION | ORAL | Status: DC | PRN
  Administered 2020-10-20 – 2020-11-10 (×65): 5 mL via ORAL
  Filled 2020-10-20 (×68): qty 5

## 2020-10-20 NOTE — Progress Notes (Signed)
PROGRESS NOTE                                                                                                                                                                                                             Patient Demographics:    Shahir Wolak, is a 55 y.o. male, DOB - Feb 14, 1966, [id number]  Outpatient Primary MD for the patient is Patient, No Pcp Per   Admit date - 09/26/2020   LOS - 24  No chief complaint on file.      Brief Narrative: Patient is a 55 y.o. male with PMHx of  CAD s/p PCI, tobacco use, HLD, GERD-who presented with shortness of breath-found to have acute hypoxic respiratory failure requiring 100% O2 via NRB due to COVID-19 pneumonia.  COVID-19 vaccinated status: Unvaccinated  Significant Events: 12/9>> started having GI symptoms including diarrhea 12/13>> to urgent care-for diarrhea-discharged after supportive care/IV fluids 12/16>> Admit to [facility] for hypoxia due to COVID-19 pneumonia-requiring 100% O2 via NRB 12/28>> hemoptysis-PCCM consulted-supportive care recommended. 12/31>> worsening hypoxemia-started on heated high flow-unable to lie flat for a CTA chest  Significant studies: 12/17>> CTA chest: No PE, diffuse groundglass opacities 12/18>> Echo: EF 60-65% 12/26>> chest x-ray: Diffuse bilateral heterogeneous/interstitial airspace opacities 12/31>> chest x-ray: Stable bilateral lung opacities 12/31>> lower extremity Doppler: No DVT 1/5>> chest x-ray: Mild improvement in multifocal opacities   COVID-19 medications: Steroids: 12/16>> Remdesivir: 12/16>> 12/20 Baricitinib: 12/17>>12/30  Antibiotics: None  Microbiology data: None  Procedures: None  Consults: PCCM  DVT prophylaxis: Lovenox at BID dosing    Subjective:   Feels better still on heated high flow-Per nursing staff-had a "panic attack" earlier this morning.  Appears comfortable-claims that he thinks  he is getting there.   Assessment  & Plan :   Acute Hypoxic Resp Failure due to Covid 19 Viral pneumonia: Continues to have severe hypoxemia-Per nursing staff have been using less NRB over the past few days-on heated high flow-nursing staff working with patient to see if they can titrate down FiO2.  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  Once FiO2 is titrated down further-suspect he can be moved out to a non-Covid unit.  Repeat chest x-ray tomorrow.  Fever: afebrile O2  requirements:  SpO2: 96 % O2 Flow Rate (L/min): 45 L/min FiO2 (%): 90 %   COVID-19 Labs: Recent Labs    10/18/20 0657 10/18/20 1256 10/19/20 0215 10/20/20 0746  DDIMER  --  2.41* 1.33* 1.71*  CRP 5.0*  --  11.9* 12.5*       Component Value Date/Time   BNP 53.8 10/11/2020 1024    No results for input(s): PROCALCITON in the last 168 hours.  Lab Results  Component Value Date   SARSCOV2NAA POSITIVE (A) 09/26/2020   SARSCOV2NAA NEGATIVE 02/06/2020   SARSCOV2NAA NEGATIVE 11/17/2019    Prone/Incentive Spirometry: encouraged  incentive spirometry use 3-4/hour.  Mildly elevated D-dimer: Secondary to COVID-19 related inflammation-hypoxia has not worsened-he remains stable-has been on prophylactic Lovenox since admission-CTA chest/lower extremity Dopplers have been negative.  On twice daily dosing of Lovenox.  Acute on chronic diastolic heart failure: No evidence of volume overload-supportive care-keep in negative balance-continue to dose IV Lasix intermittently.  Hemoptysis: Isolated episode that occurred on 12/28-likely related to underlying parenchymal injury from COVID-19 pneumonia.  No further episodes since then.  Continue to monitor closely-if reoccurs-we will reconsult PCCM.  Transaminitis: Due to COVID-19-downtrending-follow periodically  CAD s/p PCI: No  anginal symptoms-not on any antiplatelets at home.  Per patient report-PCI was approximately 11 years back  GERD: Continue PPI  Anxiety: Continues to have significant amount of anxiety-remains on Lexapro/Klonopin-we will plan to increase Lexapro to 15 mg tomorrow.  Constipation-continue MiraLAX  Obesity: Estimated body mass index is 29.58 kg/m as calculated from the following:   Height as of this encounter: 5\' 11"  (1.803 m).   Weight as of this encounter: 96.2 kg.    GI prophylaxis: PPI  ABG:    Component Value Date/Time   PHART 7.464 (H) 09/26/2020 2035   PCO2ART 31.7 (L) 09/26/2020 2035   PO2ART 90 09/26/2020 2035   HCO3 22.8 09/26/2020 2035   TCO2 24 09/26/2020 2035   ACIDBASEDEF 0.8 11/17/2019 1618   O2SAT 98.0 09/26/2020 2035    Vent Settings: N/A FiO2 (%):  [80 %-100 %] 90 %  Condition - Guarded  Family Communication  :  Shauntez Constante [phone] 1/9  Code Status :  Full Code  Diet :  Diet Order            Diet Heart Room service appropriate? No; Fluid consistency: Thin; Fluid restriction: 2000 mL Fluid  Diet effective now                  Disposition Plan  :   Status is: Inpatient  Remains inpatient appropriate because:Inpatient level of care appropriate due to severity of illness   Dispo: The patient is from: Home              Anticipated d/c is to: Home              Anticipated d/c date is: > 3 days              Patient currently is not medically stable to d/c.   Barriers to discharge: Hypoxia requiring O2 supplementation  Antimicorbials  :    Anti-infectives (From admission, onward)   Start     Dose/Rate Route Frequency Ordered Stop   09/27/20 1000  remdesivir 100 mg in sodium chloride 0.9 % 100 mL IVPB       "Followed by" Linked Group Details   100 mg 200 mL/hr over 30 Minutes Intravenous Daily 09/26/20 2208 09/30/20 1900   09/26/20 2215  remdesivir 200  mg in sodium chloride 0.9% 250 mL IVPB       "Followed by" Linked  Group Details   200 mg 580 mL/hr over 30 Minutes Intravenous Once 09/26/20 2208 09/27/20 0146      Inpatient Medications  Scheduled Meds: . benzonatate  200 mg Oral TID  . enoxaparin (LOVENOX) injection  50 mg Subcutaneous Q12H  . escitalopram  10 mg Oral Daily  . melatonin  3 mg Oral QHS  . multivitamin with minerals  1 tablet Oral Daily  . pantoprazole  40 mg Oral Daily  . polyethylene glycol  17 g Oral Daily  . predniSONE  40 mg Oral Q breakfast  . sodium chloride flush  10-40 mL Intracatheter Q12H  . sodium chloride flush  3 mL Intravenous Q12H   Continuous Infusions:  PRN Meds:.acetaminophen, albuterol, bisacodyl, clonazePAM, guaiFENesin-dextromethorphan, menthol-cetylpyridinium, ondansetron (ZOFRAN) IV, senna-docusate, sodium chloride, sodium chloride flush   Time Spent in minutes  25   See all Orders from today for further details   Oren Binet M.D on 10/20/2020 at 12:59 PM  To page go to [email or web address] - use universal password  [facility] -  Office  [phone]    Objective:   Vitals:   10/20/20 0545 10/20/20 0700 10/20/20 0900 10/20/20 0920  BP:  92/67 (!) 106/53   Pulse: 79 80 77   Resp:  (!) 25 (!) 21 (!) 22  Temp:  98.8 F (37.1 C) 98.6 F (37 C)   TempSrc:  Axillary Axillary   SpO2: 100% 100% 96%   Weight:      Height:        Wt Readings from Last 3 Encounters:  10/20/20 96.2 kg  12/04/19 108.4 kg  11/20/19 107.9 kg     Intake/Output Summary (Last 24 hours) at 10/20/2020 1259 Last data filed at 10/20/2020 0400 Gross per 24 hour  Intake -  Output 225 ml  Net -225 ml     Physical Exam Gen Exam:Alert awake-not in any distress HEENT:atraumatic, normocephalic Chest: B/L clear to auscultation anteriorly CVS:S1S2 regular Abdomen:soft non tender, non distended Extremities:no edema Neurology: Non focal Skin: no rash   Data Review:    CBC Recent Labs  Lab 10/16/20 0315 10/17/20 0219 10/18/20 0657 10/19/20 0215  10/20/20 0746  WBC 15.7* 17.4* 15.9* 15.4* 16.6*  HGB 13.1 13.7 14.6 13.9 12.9*  HCT 37.6* 41.7 41.6 39.5 36.9*  PLT 150 158 160 134* 153  MCV 91.7 92.1 90.4 90.0 90.4  MCH 32.0 30.2 31.7 31.7 31.6  MCHC 34.8 32.9 35.1 35.2 35.0  RDW 13.2 13.2 13.2 13.2 13.4    Chemistries  Recent Labs  Lab 10/16/20 0315 10/17/20 0219 10/18/20 0657 10/19/20 0215 10/20/20 0746  NA 135 137 137 135 135  K 4.5 4.7 3.9 3.4* 3.6  CL 101 98 97* 98 100  CO2 26 28 27 24 24   GLUCOSE 174* 192* 129* 168* 126*  BUN 24* 29* 29* 27* 27*  CREATININE 0.85 0.97 1.00 0.99 0.90  CALCIUM 8.3* 8.7* 8.8* 8.3* 8.5*  AST 32 28 32 30 21  ALT 115* 127* 123* 129* 96*  ALKPHOS 83 93 91 85 86  BILITOT 0.8 1.2 1.1 1.2 1.0   ------------------------------------------------------------------------------------------------------------------ No results for input(s): CHOL, HDL, LDLCALC, TRIG, CHOLHDL, LDLDIRECT in the last 72 hours.  No results found for: HGBA1C ------------------------------------------------------------------------------------------------------------------ No results for input(s): TSH, T4TOTAL, T3FREE, THYROIDAB in the last 72 hours.  Invalid input(s): FREET3 ------------------------------------------------------------------------------------------------------------------ No results for input(s): VITAMINB12, FOLATE, FERRITIN, TIBC, IRON, RETICCTPCT  in the last 72 hours.  Coagulation profile No results for input(s): INR, PROTIME in the last 168 hours.  Recent Labs    10/19/20 0215 10/20/20 0746  DDIMER 1.33* 1.71*    Cardiac Enzymes No results for input(s): CKMB, TROPONINI, MYOGLOBIN in the last 168 hours.  Invalid input(s): CK ------------------------------------------------------------------------------------------------------------------    Component Value Date/Time   BNP 53.8 10/11/2020 1024    Micro Results No results found for this or any previous visit (from the past 240  hour(s)).  Radiology Reports CT ANGIO CHEST PE W OR WO CONTRAST  Result Date: 09/27/2020 CLINICAL DATA:  55 year old male with history of COVID-19, suspected pulmonary embolism. EXAM: CT ANGIOGRAPHY CHEST WITH CONTRAST TECHNIQUE: Multidetector CT imaging of the chest was performed using the standard protocol during bolus administration of intravenous contrast. Multiplanar CT image reconstructions and MIPs were obtained to evaluate the vascular anatomy. CONTRAST:  100 mL Omnipaque 350, intravenous COMPARISON:  Chest CT from 11/17/2019 FINDINGS: Cardiovascular: Satisfactory opacification of the pulmonary arteries to the segmental level. No evidence of pulmonary embolism. Normal heart size. No pericardial effusion. The heart is normal in size. No pericardial effusion. Severe coronary atherosclerotic calcifications. Mediastinum/Nodes: Similar appearing prominent right paratracheal lymph node measuring up to 1.2 cm in short axis. Otherwise no enlarged mediastinal, hilar, or axillary lymph nodes. Thyroid gland, trachea, and esophagus demonstrate no significant findings. Lungs/Pleura: Interval development of near diffuse, peripherally predominant ground-glass pulmonary opacities with a mid lung and basal predominance. Similar appearing moderate upper lobe predominant paraseptal and centrilobular emphysema. No pleural effusion or pneumothorax. Upper Abdomen: The visualized upper abdomen is within normal limits. Musculoskeletal: No chest wall abnormality. No acute or significant osseous findings. Review of the MIP images confirms the above findings. IMPRESSION: Vascular: 1. No evidence of pulmonary embolism. 2. Severe coronary atherosclerotic calcifications. Non-Vascular: 1. Diffuse, mid lung and basilar, peripherally predominant ground-glass opacities - findings compatible with multifocal pneumonia associated with COVID-19. 2. Similar appearing moderate upper lobe predominant centrilobular and paraseptal emphysema.  Electronically Signed   By: Ruthann Cancer MD   On: 09/27/2020 14:31   DG Chest Port 1 View  Result Date: 10/06/2020 CLINICAL DATA:  COVID positive, pleural effusion, pneumothorax EXAM: PORTABLE CHEST 1 VIEW COMPARISON:  09/26/2020 FINDINGS: Slight interval increase in diffuse bilateral heterogeneous and interstitial airspace opacity, most conspicuous at the lung bases. No significant pleural effusion noted. No pneumothorax. The heart and mediastinum are unremarkable. IMPRESSION: 1. Slight interval increase in diffuse bilateral heterogeneous and interstitial airspace opacity, most conspicuous at the lung bases, consistent with worsened COVID airspace disease. 2.  No significant pleural effusion noted. No pneumothorax. Electronically Signed   By: Eddie Candle M.D.   On: 10/06/2020 09:37   DG CHEST PORT 1 VIEW  Result Date: 09/26/2020 CLINICAL DATA:  COVID-19 EXAM: PORTABLE CHEST 1 VIEW COMPARISON:  09/26/2020 FINDINGS: Slight worsening of bilateral basilar predominant opacities. No pleural effusion or pneumothorax. Normal cardiomediastinal contours. IMPRESSION: Slight worsening of bilateral basilar predominant opacities. Electronically Signed   By: Ulyses Jarred M.D.   On: 09/26/2020 23:26   DG Chest Portable 1 View  Result Date: 09/26/2020 CLINICAL DATA:  Cough and shortness of breath. EXAM: PORTABLE CHEST 1 VIEW COMPARISON:  12/04/2019 FINDINGS: Cardiomegaly which is increased from prior exam. Pulmonary edema, moderate in degree. There are bilateral pleural effusions. No confluent consolidation. No pneumothorax. No acute osseous abnormalities are seen. IMPRESSION: CHF with cardiomegaly, pulmonary edema and bilateral pleural effusions. Electronically Signed   By: Aurther Loft.D.  On: 09/26/2020 19:14   DG Chest Port 1V same Day  Result Date: 10/16/2020 CLINICAL DATA:  COVID positive, short of breath EXAM: PORTABLE CHEST 1 VIEW COMPARISON:  None. FINDINGS: Normal cardiac silhouette. Bilateral  peripheral fine airspace disease appears slightly less dense than comparison exam. No pneumothorax. No focal consolidation. IMPRESSION: Mild improvement in bilateral multifocal viral pneumonia. Electronically Signed   By: Suzy Bouchard M.D.   On: 10/16/2020 13:32   DG Chest Port 1V same Day  Result Date: 10/13/2020 CLINICAL DATA:  55 year old male with respiratory failure. Recent bronchoscopy and hemoptysis. History of COVID-19. EXAM: PORTABLE CHEST 1 VIEW COMPARISON:  Portable chest 10/11/2020 and earlier. FINDINGS: Portable AP semi upright view at 0554 hours. Lower lung volumes. Stable cardiac size and mediastinal contours. Visualized tracheal air column is within normal limits. Diffuse reticulonodular pulmonary opacity with confluent density in the peripheral right lung and at the lung bases now worse on the right. No pneumothorax. No definite pleural effusion. Stable visualized osseous structures. IMPRESSION: Continued diffuse reticulonodular opacity with lower lung volumes and worsening ventilation since 10/11/2020 at both lung bases. Electronically Signed   By: Genevie Ann M.D.   On: 10/13/2020 07:57   DG Chest Port 1V same Day  Result Date: 10/11/2020 CLINICAL DATA:  Hypoxia.  Shortness of breath. EXAM: PORTABLE CHEST 1 VIEW COMPARISON:  October 06, 2020. FINDINGS: The heart size and mediastinal contours are within normal limits. No pneumothorax or pleural effusion is noted. Stable bilateral lung opacities are noted concerning for multifocal pneumonia. The visualized skeletal structures are unremarkable. IMPRESSION: Stable bilateral lung opacities are noted concerning for multifocal pneumonia. Electronically Signed   By: Marijo Conception M.D.   On: 10/11/2020 10:06   VAS [place] LOWER EXTREMITY VENOUS (DVT)  Result Date: 10/12/2020  Lower Venous DVT Study Indications: Worsening hypoxia, Covid-19.  Comparison Study: No prior studies. Performing Technologist: Darlin Coco, RDMS  Examination Guidelines: A  complete evaluation includes B-mode imaging, spectral Doppler, color Doppler, and power Doppler as needed of all accessible portions of each vessel. Bilateral testing is considered an integral part of a complete examination. Limited examinations for reoccurring indications may be performed as noted. The reflux portion of the exam is performed with the patient in reverse Trendelenburg.  +---------+---------------+---------+-----------+----------+--------------+ RIGHT    CompressibilityPhasicitySpontaneityPropertiesThrombus Aging +---------+---------------+---------+-----------+----------+--------------+ CFV      Full           Yes      Yes                                 +---------+---------------+---------+-----------+----------+--------------+ SFJ      Full                                                        +---------+---------------+---------+-----------+----------+--------------+ FV Prox  Full                                                        +---------+---------------+---------+-----------+----------+--------------+ FV Mid   Full                                                        +---------+---------------+---------+-----------+----------+--------------+  FV DistalFull                                                        +---------+---------------+---------+-----------+----------+--------------+ PFV      Full                                                        +---------+---------------+---------+-----------+----------+--------------+ POP      Full           Yes      Yes                                 +---------+---------------+---------+-----------+----------+--------------+ PTV      Full                                                        +---------+---------------+---------+-----------+----------+--------------+ PERO     Full                                                         +---------+---------------+---------+-----------+----------+--------------+   +---------+---------------+---------+-----------+----------+--------------+ LEFT     CompressibilityPhasicitySpontaneityPropertiesThrombus Aging +---------+---------------+---------+-----------+----------+--------------+ CFV      Full           Yes      Yes                                 +---------+---------------+---------+-----------+----------+--------------+ SFJ      Full                                                        +---------+---------------+---------+-----------+----------+--------------+ FV Prox  Full                                                        +---------+---------------+---------+-----------+----------+--------------+ FV Mid   Full                                                        +---------+---------------+---------+-----------+----------+--------------+ FV DistalFull                                                        +---------+---------------+---------+-----------+----------+--------------+  PFV      Full                                                        +---------+---------------+---------+-----------+----------+--------------+ POP      Full           Yes      Yes                                 +---------+---------------+---------+-----------+----------+--------------+ PTV      Full                                                        +---------+---------------+---------+-----------+----------+--------------+ PERO     Full                                                        +---------+---------------+---------+-----------+----------+--------------+     Summary: RIGHT: - There is no evidence of deep vein thrombosis in the lower extremity.  - No cystic structure found in the popliteal fossa.  LEFT: - There is no evidence of deep vein thrombosis in the lower extremity.  - No cystic structure found in the popliteal fossa.   *See table(s) above for measurements and observations. Electronically signed by Ruta Hinds MD on 10/12/2020 at 10:00:37 AM.    Final    ECHOCARDIOGRAM LIMITED  Result Date: 09/28/2020    ECHOCARDIOGRAM LIMITED REPORT   Patient Name:   Zaylyn Loletha Grayer Fleissner Date of Exam: 09/28/2020 Medical Rec #:  [id number]          Height:       71.0 in Accession #:    [id number]         Weight:       244.3 lb Date of Birth:  05/02/1966          BSA:          2.296 m Patient Age:    78 years           BP:           120/84 mmHg Patient Gender: M                  HR:           73 bpm. Exam Location:  Inpatient Procedure: Limited Echo, Limited Color Doppler and Cardiac Doppler Indications:    acute respiratory distress  History:        Patient has prior history of Echocardiogram examinations, most                 recent 03/30/2016. CAD; Covid.  Sonographer:    Johny Chess Referring Phys: [id number] Candace Gallus MELVIN  Sonographer Comments: Image acquisition challenging due to respiratory motion. IMPRESSIONS  1. Left ventricular ejection fraction, by estimation, is 60 to 65%. The left ventricle has normal function. The left ventricle has no regional wall motion abnormalities. There is mild left ventricular hypertrophy.  2. Right ventricular systolic function is normal. The right ventricular size is normal.  3. The mitral valve is normal in structure. No evidence of mitral valve regurgitation. No evidence of mitral stenosis.  4. The aortic valve is tricuspid.  5. The inferior vena cava is normal in size with greater than 50% respiratory variability, suggesting right atrial pressure of 3 mmHg. FINDINGS  Left Ventricle: Left ventricular ejection fraction, by estimation, is 60 to 65%. The left ventricle has normal function. The left ventricle has no regional wall motion abnormalities. The left ventricular internal cavity size was normal in size. There is  mild left ventricular hypertrophy. Right Ventricle: The right ventricular size  is normal. No increase in right ventricular wall thickness. Right ventricular systolic function is normal. Pericardium: There is no evidence of pericardial effusion. Mitral Valve: The mitral valve is normal in structure. No evidence of mitral valve stenosis. Tricuspid Valve: The tricuspid valve is normal in structure. Tricuspid valve regurgitation is not demonstrated. No evidence of tricuspid stenosis. Aortic Valve: The aortic valve is tricuspid. Pulmonic Valve: The pulmonic valve was not well visualized. Pulmonic valve regurgitation is not visualized. No evidence of pulmonic stenosis. Aorta: The aortic root is normal in size and structure. Pulmonary Artery: Indeterminant PASP, inadequate TR jet. Venous: The inferior vena cava is normal in size with greater than 50% respiratory variability, suggesting right atrial pressure of 3 mmHg. IAS/Shunts: No atrial level shunt detected by color flow Doppler. LEFT VENTRICLE PLAX 2D LVIDd:         4.20 cm LVIDs:         2.80 cm LV PW:         1.30 cm LV IVS:        1.10 cm LVOT diam:     2.00 cm LV SV:         71 LV SV Index:   31 LVOT Area:     3.14 cm  IVC IVC diam: 1.70 cm LEFT ATRIUM         Index LA diam:    4.30 cm 1.87 cm/m  AORTIC VALVE LVOT Vmax:   116.00 cm/s LVOT Vmean:  76.100 cm/s LVOT VTI:    0.227 m  AORTA Ao Root diam: 3.10 cm MITRAL VALVE MV Area (PHT): 2.91 cm    SHUNTS MV Decel Time: 261 msec    Systemic VTI:  0.23 m MV E velocity: 75.40 cm/s  Systemic Diam: 2.00 cm MV A velocity: 66.00 cm/s MV E/A ratio:  1.14 Carlyle Dolly MD Electronically signed by Carlyle Dolly MD Signature Date/Time: 09/28/2020/2:52:33 PM    Final

## 2020-10-20 NOTE — Progress Notes (Signed)
   10/20/20 0056  Assess: MEWS Score  Temp 97.8 F (36.6 C)  BP (!) 88/59  Pulse Rate 70  ECG Heart Rate 70  Resp (!) 22  SpO2 99 %  O2 Device HFNC  Patient Activity (if Appropriate) In bed  O2 Flow Rate (L/min) 45 L/min  FiO2 (%) 90 %  Assess: MEWS Score  MEWS Temp 0  MEWS Systolic 1  MEWS Pulse 0  MEWS RR 1  MEWS LOC 0  MEWS Score 2  MEWS Score Color Yellow  Assess: if the MEWS score is Yellow or Red  Were vital signs taken at a resting state? Yes  Focused Assessment No change from prior assessment  Early Detection of Sepsis Score *See Row Information* Low  MEWS guidelines implemented *See Row Information* Yes  Treat  Pain Score Asleep  Take Vital Signs  Increase Vital Sign Frequency  Yellow: Q 2hr X 2 then Q 4hr X 2, if remains yellow, continue Q 4hrs  Escalate  MEWS: Escalate Yellow: discuss with charge nurse/RN and consider discussing with provider and RRT  Notify: Charge Nurse/RN  Name of Charge Nurse/RN Notified Selinda Orion., RN  Date Charge Nurse/RN Notified 10/20/20  Time Charge Nurse/RN Notified 0108  Document  Patient Outcome Other (Comment) (patient continues to have tachypnea)  Progress note created (see row info) Yes  Patient fired a yellow MEWS score due to tachypnea.

## 2020-10-20 NOTE — Progress Notes (Signed)
Occupational Therapy Treatment Patient Details Name: Matthew [name] MRN: [id number] DOB: September 02, 1966 Today's Date: 10/20/2020    History of present illness Pt is a 56 y.o. male admitted 09/26/20 with SOB and GI distress (had declined COVID test when seen in urgent care). Pt now testing (+) COVID-19. Workup for acute hypoxic respiratory failure due to acute COVID-19 viral PNA. Course complicated by hemoptysis, worsening hypoxemis requiring HHFNC; pt unable to lay flat for chest CTA. Negative DVT on 12/31. PMH includes CAD, CHF, tobacco use (quit 11/2019); pt unvaccinated.   OT comments  With coaxing, pt transferred to recliner, but required 2 attempts due to significant anxiety.  Music utilized.  Continue to coach patient on relaxation strategies and controlled breathing.  02 sats as low as 93% with pt on 60L heated HFNC 90% Fi02.  DOE 3/4 (signifcantly improved this date).   Wife present at end of session.    Follow Up Recommendations  LTACH;Home health OT    Equipment Recommendations  Tub/shower bench    Recommendations for Other Services      Precautions / Restrictions Precautions Precautions: Fall;Other (comment) Precaution Comments: significant anxiety       Mobility Bed Mobility Overal bed mobility: Modified Independent                Transfers Overall transfer level: Needs assistance Equipment used: 1 person hand held assist Transfers: Sit to/from Stand;Stand Pivot Transfers Sit to Stand: Min guard Stand pivot transfers: Min guard       General transfer comment: min guard for transfer to recliner and coaching for controlled breathing and relaxation strategies to reduce anxiety    Balance Overall balance assessment: Needs assistance Sitting-balance support: Feet supported Sitting balance-Leahy Scale: Good     Standing balance support: Single extremity supported Standing balance-Leahy Scale: Poor Standing balance comment: requires UE support                            ADL either performed or assessed with clinical judgement   ADL Overall ADL's : Needs assistance/impaired                         Toilet Transfer: Min guard;BSC           Functional mobility during ADLs: Min guard (stand pivot tranfers only) General ADL Comments: limited by severe anxiety and fatigue     Vision       Perception     Praxis      Cognition Arousal/Alertness: Awake/alert Behavior During Therapy: Anxious Overall Cognitive Status: Within Functional Limits for tasks assessed                                 General Comments: pt continues with significant anxiety        Exercises     Shoulder Instructions       General Comments Pt on 60L, 90% Fi02 +NRB with Fi02 93-98%.  He required a significant amount of time to transfer to recliner.  He sat up one time, then immediately returne to sidelying.  with coaxing, he did reattempt.  Utilized music to reduce anxiety    Pertinent Vitals/ Pain       Pain Assessment: No/denies pain  Home Living  Prior Functioning/Environment              Frequency  Min 3X/week        Progress Toward Goals  OT Goals(current goals can now be found in the care plan section)  Progress towards OT goals: Not progressing toward goals - comment (due to anxiety)     Plan Discharge plan remains appropriate    Co-evaluation                 AM-PAC OT "6 Clicks" Daily Activity     Outcome Measure   Help from another person eating meals?: A Little Help from another person taking care of personal grooming?: A Little Help from another person toileting, which includes using toliet, bedpan, or urinal?: A Little Help from another person bathing (including washing, rinsing, drying)?: A Little Help from another person to put on and taking off regular upper body clothing?: A Little Help from another person to put on  and taking off regular lower body clothing?: A Little 6 Click Score: 18    End of Session Equipment Utilized During Treatment: Oxygen  OT Visit Diagnosis: Unsteadiness on feet (R26.81);Other abnormalities of gait and mobility (R26.89);Muscle weakness (generalized) (M62.81);Dizziness and giddiness (R42)   Activity Tolerance Other (comment)   Patient Left in chair;with call bell/phone within reach;with family/visitor present (wife present)   Nurse Communication Mobility status        Time: 1610-9604 OT Time Calculation (min): 46 min  Charges: OT General Charges $OT Visit: 1 Visit OT Treatments $Therapeutic Activity: 38-52 mins  Nilsa Nutting., OTR/L Acute Rehabilitation Services Pager [phone] Office [place], [place] 10/20/2020, 3:45 PM

## 2020-10-21 ENCOUNTER — Inpatient Hospital Stay (HOSPITAL_COMMUNITY): Payer: HRSA Program

## 2020-10-21 ENCOUNTER — Ambulatory Visit: Payer: No Typology Code available for payment source

## 2020-10-21 DIAGNOSIS — I5031 Acute diastolic (congestive) heart failure: Secondary | ICD-10-CM

## 2020-10-21 DIAGNOSIS — F419 Anxiety disorder, unspecified: Secondary | ICD-10-CM

## 2020-10-21 LAB — CBC
HCT: 36.2 % — ABNORMAL LOW (ref 39.0–52.0)
Hemoglobin: 12.7 g/dL — ABNORMAL LOW (ref 13.0–17.0)
MCH: 31.9 pg (ref 26.0–34.0)
MCHC: 35.1 g/dL (ref 30.0–36.0)
MCV: 91 fL (ref 80.0–100.0)
Platelets: 165 10*3/uL (ref 150–400)
RBC: 3.98 MIL/uL — ABNORMAL LOW (ref 4.22–5.81)
RDW: 13.3 % (ref 11.5–15.5)
WBC: 14.1 10*3/uL — ABNORMAL HIGH (ref 4.0–10.5)
nRBC: 0 % (ref 0.0–0.2)

## 2020-10-21 LAB — COMPREHENSIVE METABOLIC PANEL
ALT: 75 U/L — ABNORMAL HIGH (ref 0–44)
AST: 16 U/L (ref 15–41)
Albumin: 2.4 g/dL — ABNORMAL LOW (ref 3.5–5.0)
Alkaline Phosphatase: 89 U/L (ref 38–126)
Anion gap: 10 (ref 5–15)
BUN: 25 mg/dL — ABNORMAL HIGH (ref 6–20)
CO2: 25 mmol/L (ref 22–32)
Calcium: 8.3 mg/dL — ABNORMAL LOW (ref 8.9–10.3)
Chloride: 98 mmol/L (ref 98–111)
Creatinine, Ser: 0.83 mg/dL (ref 0.61–1.24)
GFR, Estimated: >60 mL/min (ref >60)
Glucose, Bld: 115 mg/dL — ABNORMAL HIGH (ref 70–99)
Potassium: 3.7 mmol/L (ref 3.5–5.1)
Sodium: 133 mmol/L — ABNORMAL LOW (ref 135–145)
Total Bilirubin: 0.8 mg/dL (ref 0.3–1.2)
Total Protein: 6.7 g/dL (ref 6.5–8.1)

## 2020-10-21 LAB — C-REACTIVE PROTEIN: CRP: 13.3 mg/dL — ABNORMAL HIGH (ref <1.0)

## 2020-10-21 LAB — D-DIMER, QUANTITATIVE: D-Dimer, Quant: 1.2 ug/mL-FEU — ABNORMAL HIGH (ref 0.00–0.50)

## 2020-10-21 NOTE — Progress Notes (Signed)
Physical Therapy Treatment Patient Details Name: Matthew [name] MRN: [id number] DOB: Mar 04, 1966 Today's Date: 10/21/2020    History of Present Illness Pt is a 55 y.o. male admitted 09/26/20 with SOB and GI distress (had declined COVID test when seen in urgent care). Pt now testing (+) COVID-19. Workup for acute hypoxic respiratory failure due to acute COVID-19 viral PNA. Course complicated by hemoptysis, worsening hypoxemis requiring HHFNC; pt unable to lay flat for chest CTA. Negative DVT on 12/31. PMH includes CAD, CHF, tobacco use (quit 11/2019); pt unvaccinated.   PT Comments    Pt continues to self-report he is limited by his anxiety as well as increased fatigue. Prolonged time with education related to this, activity progression and importance of mobility. Patient states, "I say I want to get out of here, but I know my actions don't match that." Reports agreeable to attempt ambulation tomorrow after 2pm ("The worst part of my day is 5AM to 2PM"), which is due to wanting anxiety meds before mobility attempts. Patient receptive to education, but ultimately unwilling to mobilize out of chair this session. Will continue to follow acutely as pt agreeable to participate.  SpO2 >/96% on 40L O2 HHFNC at 70% FiO2 without NRB; pt utilizing NRB during conversation although no desaturation and encouraged to attempt without    Follow Up Recommendations  Home health PT;Supervision for mobility/OOB (pending progress)     Equipment Recommendations  None recommended by PT    Recommendations for Other Services       Precautions / Restrictions Precautions Precautions: Fall;Other (comment) Precaution Comments: significant anxiety Restrictions Weight Bearing Restrictions: No    Mobility  Bed Mobility               General bed mobility comments: Received sitting in recliner  Transfers                 General transfer comment: Pt repositioning self well in recliner, ultimately  declining attempt to stand  Ambulation/Gait                 Stairs             Wheelchair Mobility    Modified Rankin (Stroke Patients Only)       Balance Overall balance assessment: Needs assistance Sitting-balance support: Feet supported;Feet unsupported Sitting balance-Leahy Scale: Good                                      Cognition Arousal/Alertness: Awake/alert Behavior During Therapy: Anxious Overall Cognitive Status: Within Functional Limits for tasks assessed                                 General Comments: Pt stating, "I want to walk... but they won't let me walk" - after PT starting to setting up lines for ambulation attempts, pt stating, "But I want to walk how I want to walk... to the doorway, rest, then out in the hallway, then rest." Explained to patient that he's on [place], can potentially walk in room on 25L tank + 15L NRB tank, but cannot walk in hallway until </6L O2. When it came time to walk, pt adamant that he cannot do so until tomorrow; despite max encouragement and education, pt stating, "I promise I'll do it tomorrow." Pt also stating, "I know I say I want to get  out of here, but my actions don't match that" - increased time discussing this and pt's anxiety      Exercises      General Comments General comments (skin integrity, edema, etc.): SpO2 >/96% on 40L O2 HHFNC at 70% FiO2 without NRB; pt using NRB intermittently during conversation although no desaturation noted; pt reports, "They told me to use it if I'm talking a lot" - encouraged pt to try without it, but pt still opting to use it. Increased time discussing anxiety, "panic attacks" and options for activity progression. Pt open to education but ultimately declining mobility progression today; reports due to fatigue      Pertinent Vitals/Pain Pain Assessment: No/denies pain    Home Living                      Prior Function            PT  Goals (current goals can now be found in the care plan section) Progress towards PT goals: Not progressing toward goals - comment (limited by anxiety, fatigue)    Frequency    Min 3X/week      PT Plan Current plan remains appropriate    Co-evaluation              AM-PAC PT "6 Clicks" Mobility   Outcome Measure  Help needed turning from your back to your side while in a flat bed without using bedrails?: None Help needed moving from lying on your back to sitting on the side of a flat bed without using bedrails?: None Help needed moving to and from a bed to a chair (including a wheelchair)?: A Little Help needed standing up from a chair using your arms (e.g., wheelchair or bedside chair)?: A Little Help needed to walk in hospital room?: A Lot Help needed climbing 3-5 steps with a railing? : A Lot 6 Click Score: 18    End of Session Equipment Utilized During Treatment: Oxygen Activity Tolerance: Patient limited by fatigue Patient left: in chair;with call bell/phone within reach Nurse Communication: Mobility status PT Visit Diagnosis: Other abnormalities of gait and mobility (R26.89)     Time: 1610-9604 PT Time Calculation (min) (ACUTE ONLY): 20 min  Charges:  $Self Care/Home Management: 8-22                     Mabeline Caras, PT, DPT Acute Rehabilitation Services  Pager [phone] Office [place] 10/21/2020, 4:42 PM

## 2020-10-21 NOTE — Progress Notes (Signed)
PROGRESS NOTE                                                                                                                                                                                                             Patient Demographics:    Matthew [name], is a 55 y.o. male, DOB - Mar 15, 1966, ZL:[id number]  Outpatient Primary MD for the patient is Patient, No Pcp Per   Admit date - 09/26/2020   LOS - 25  No chief complaint on file.      Brief Narrative: Patient is a 56 y.o. male with PMHx of  CAD s/p PCI, tobacco use, HLD, GERD-who presented with shortness of breath-found to have acute hypoxic respiratory failure requiring 100% O2 via NRB due to COVID-19 pneumonia.  COVID-19 vaccinated status: Unvaccinated  Significant Events: 12/9>> started having GI symptoms including diarrhea 12/13>> to urgent care-for diarrhea-discharged after supportive care/IV fluids 12/16>> Admit to [facility] for hypoxia due to COVID-19 pneumonia-requiring 100% O2 via NRB 12/28>> hemoptysis-PCCM consulted-supportive care recommended. 12/31>> worsening hypoxemia-started on heated high flow-unable to lie flat for a CTA chest  Significant studies: 12/17>> CTA chest: No PE, diffuse groundglass opacities 12/18>> Echo: EF 60-65% 12/26>> chest x-ray: Diffuse bilateral heterogeneous/interstitial airspace opacities 12/31>> chest x-ray: Stable bilateral lung opacities 12/31>> lower extremity Doppler: No DVT 1/5>> chest x-ray: Mild improvement in multifocal opacities 1/10>> chest x-ray: Diffuse bilateral airspace disease   COVID-19 medications: Steroids: 12/16>> Remdesivir: 12/16>> 12/20 Baricitinib: 12/17>>12/30  Antibiotics: None  Microbiology data: None  Procedures: None  Consults: PCCM  DVT prophylaxis: Lovenox at BID dosing    Subjective:   Lying comfortably in bed-no major issues-remains on heated high flow but FiO2 seems to  be slowly trending down over the past few days.   Assessment  & Plan :   Acute Hypoxic Resp Failure due to Covid 19 Viral pneumonia: Continues to have severe hypoxemia-remains on heated high flow-but less use of NRB compared to the past few days-FiO2 on heated high flow also seems to be slowly coming down.  He appears comfortable-but still continues to have significant amount of anxiety.  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  Once his anxiety symptoms are better controlled-FiO2 requirements start coming down-suspect we can move him out  of isolation to a non-COVID unit.   Fever: afebrile O2 requirements:  SpO2: 98 % O2 Flow Rate (L/min): 35 L/min FiO2 (%): 70 %   COVID-19 Labs: Recent Labs    10/19/20 0215 10/20/20 0746 10/21/20 0147  DDIMER 1.33* 1.71* 1.20*  CRP 11.9* 12.5* 13.3*       Component Value Date/Time   BNP 53.8 10/11/2020 1024    No results for input(s): PROCALCITON in the last 168 hours.  Lab Results  Component Value Date   SARSCOV2NAA POSITIVE (A) 09/26/2020   [place] NEGATIVE 02/06/2020   [place] NEGATIVE 11/17/2019    Prone/Incentive Spirometry: encouraged  incentive spirometry use 3-4/hour.  Mildly elevated D-dimer: Secondary to COVID-19 related inflammation-hypoxia has not worsened-he remains stable-has been on prophylactic Lovenox since admission-CTA chest/lower extremity Dopplers have been negative.  On twice daily dosing of Lovenox.  Acute on chronic diastolic heart failure: No evidence of volume overload-supportive care-keep in negative balance-continue to dose IV Lasix intermittently.  Suspect I/os inaccurate-Weight down to 211 pounds (250 pounds on admission)!!  Hemoptysis: Isolated episode that occurred on 12/28-likely related to underlying parenchymal injury from COVID-19 pneumonia.  No further episodes since then.  Continue to monitor closely-if  reoccurs-we will reconsult PCCM.  Transaminitis: Due to COVID-19-downtrending-follow periodically  CAD s/p PCI: No anginal symptoms-not on any antiplatelets at home.  Per patient report-PCI was approximately 11 years back  GERD: Continue PPI  Anxiety: Continues to have significant amount of anxiety-remains on Lexapro/Klonopin-dosage of Lexapro has been gradually increased to 15 mg-on 1 mg of Klonopin 3 times daily as needed.  Constipation-continue MiraLAX  Obesity: Estimated body mass index is 29.49 kg/m as calculated from the following:   Height as of this encounter: 5\' 11"  (1.803 m).   Weight as of this encounter: 95.9 kg.    GI prophylaxis: PPI  ABG:    Component Value Date/Time   PHART 7.464 (H) 09/26/2020 2035   PCO2ART 31.7 (L) 09/26/2020 2035   PO2ART 90 09/26/2020 2035   HCO3 22.8 09/26/2020 2035   TCO2 24 09/26/2020 2035   ACIDBASEDEF 0.8 11/17/2019 1618   O2SAT 98.0 09/26/2020 2035    Vent Settings: N/A FiO2 (%):  [70 %-100 %] 70 %  Condition - Guarded  Family Communication  :  Spouse-Diana Parrales [phone]-left a voicemail on 1/10  Code Status :  Full Code  Diet :  Diet Order            Diet Heart Room service appropriate? No; Fluid consistency: Thin; Fluid restriction: 2000 mL Fluid  Diet effective now                  Disposition Plan  :   Status is: Inpatient  Remains inpatient appropriate because:Inpatient level of care appropriate due to severity of illness   Dispo: The patient is from: Home              Anticipated d/c is to: Home              Anticipated d/c date is: > 3 days              Patient currently is not medically stable to d/c.   Barriers to discharge: Hypoxia requiring O2 supplementation  Antimicorbials  :    Anti-infectives (From admission, onward)   Start     Dose/Rate Route Frequency Ordered Stop   09/27/20 1000  remdesivir 100 mg in sodium chloride 0.9 % 100 mL IVPB       "  Followed by" Linked Group Details    100 mg 200 mL/hr over 30 Minutes Intravenous Daily 09/26/20 2208 09/30/20 1900   09/26/20 2215  remdesivir 200 mg in sodium chloride 0.9% 250 mL IVPB       "Followed by" Linked Group Details   200 mg 580 mL/hr over 30 Minutes Intravenous Once 09/26/20 2208 09/27/20 0146      Inpatient Medications  Scheduled Meds: . benzonatate  200 mg Oral TID  . enoxaparin (LOVENOX) injection  50 mg Subcutaneous Q12H  . escitalopram  15 mg Oral Daily  . melatonin  3 mg Oral QHS  . multivitamin with minerals  1 tablet Oral Daily  . pantoprazole  40 mg Oral Daily  . polyethylene glycol  17 g Oral Daily  . predniSONE  30 mg Oral Q breakfast  . sodium chloride flush  10-40 mL Intracatheter Q12H  . sodium chloride flush  3 mL Intravenous Q12H   Continuous Infusions:  PRN Meds:.acetaminophen, albuterol, bisacodyl, clonazePAM, guaiFENesin-dextromethorphan, menthol-cetylpyridinium, ondansetron (ZOFRAN) IV, senna-docusate, sodium chloride, sodium chloride flush   Time Spent in minutes  25   See all Orders from today for further details   Oren Binet M.D on 10/21/2020 at 11:45 AM  To page go to [email or web address] - use universal password  [facility] -  Office  [phone]    Objective:   Vitals:   10/21/20 0500 10/21/20 0508 10/21/20 0625 10/21/20 0735  BP:  95/81  99/62  Pulse:  79  82  Resp:  20  20  Temp:  98.3 F (36.8 C)  97.6 F (36.4 C)  TempSrc:  Axillary  Axillary  SpO2:  98% 97% 98%  Weight: 96.3 kg  95.9 kg   Height:        Wt Readings from Last 3 Encounters:  10/21/20 95.9 kg  12/04/19 108.4 kg  11/20/19 107.9 kg     Intake/Output Summary (Last 24 hours) at 10/21/2020 1145 Last data filed at 10/21/2020 0100 Gross per 24 hour  Intake 120 ml  Output -  Net 120 ml     Physical Exam Gen Exam:Alert awake-not in any distress HEENT:atraumatic, normocephalic Chest: B/L clear to auscultation anteriorly CVS:S1S2 regular Abdomen:soft non tender, non  distended Extremities:no edema Neurology: Non focal Skin: no rash   Data Review:    CBC Recent Labs  Lab 10/17/20 0219 10/18/20 0657 10/19/20 0215 10/20/20 0746 10/21/20 0147  WBC 17.4* 15.9* 15.4* 16.6* 14.1*  HGB 13.7 14.6 13.9 12.9* 12.7*  HCT 41.7 41.6 39.5 36.9* 36.2*  PLT 158 160 134* 153 165  MCV 92.1 90.4 90.0 90.4 91.0  MCH 30.2 31.7 31.7 31.6 31.9  MCHC 32.9 35.1 35.2 35.0 35.1  RDW 13.2 13.2 13.2 13.4 13.3    Chemistries  Recent Labs  Lab 10/17/20 0219 10/18/20 0657 10/19/20 0215 10/20/20 0746 10/21/20 0147  NA 137 137 135 135 133*  K 4.7 3.9 3.4* 3.6 3.7  CL 98 97* 98 100 98  CO2 28 27 24 24 25   GLUCOSE 192* 129* 168* 126* 115*  BUN 29* 29* 27* 27* 25*  CREATININE 0.97 1.00 0.99 0.90 0.83  CALCIUM 8.7* 8.8* 8.3* 8.5* 8.3*  AST 28 32 30 21 16   ALT 127* 123* 129* 96* 75*  ALKPHOS 93 91 85 86 89  BILITOT 1.2 1.1 1.2 1.0 0.8   ------------------------------------------------------------------------------------------------------------------ No results for input(s): CHOL, HDL, LDLCALC, TRIG, CHOLHDL, LDLDIRECT in the last 72 hours.  No results found for: HGBA1C ------------------------------------------------------------------------------------------------------------------ No results for  input(s): TSH, T4TOTAL, T3FREE, THYROIDAB in the last 72 hours.  Invalid input(s): FREET3 ------------------------------------------------------------------------------------------------------------------ No results for input(s): VITAMINB12, FOLATE, FERRITIN, TIBC, IRON, RETICCTPCT in the last 72 hours.  Coagulation profile No results for input(s): INR, PROTIME in the last 168 hours.  Recent Labs    10/20/20 0746 10/21/20 0147  DDIMER 1.71* 1.20*    Cardiac Enzymes No results for input(s): CKMB, TROPONINI, MYOGLOBIN in the last 168 hours.  Invalid input(s):  CK ------------------------------------------------------------------------------------------------------------------    Component Value Date/Time   BNP 53.8 10/11/2020 1024    Micro Results No results found for this or any previous visit (from the past 240 hour(s)).  Radiology Reports CT ANGIO CHEST PE W OR WO CONTRAST  Result Date: 09/27/2020 CLINICAL DATA:  56 year old male with history of COVID-19, suspected pulmonary embolism. EXAM: CT ANGIOGRAPHY CHEST WITH CONTRAST TECHNIQUE: Multidetector CT imaging of the chest was performed using the standard protocol during bolus administration of intravenous contrast. Multiplanar CT image reconstructions and MIPs were obtained to evaluate the vascular anatomy. CONTRAST:  100 mL Omnipaque 350, intravenous COMPARISON:  Chest CT from 11/17/2019 FINDINGS: Cardiovascular: Satisfactory opacification of the pulmonary arteries to the segmental level. No evidence of pulmonary embolism. Normal heart size. No pericardial effusion. The heart is normal in size. No pericardial effusion. Severe coronary atherosclerotic calcifications. Mediastinum/Nodes: Similar appearing prominent right paratracheal lymph node measuring up to 1.2 cm in short axis. Otherwise no enlarged mediastinal, hilar, or axillary lymph nodes. Thyroid gland, trachea, and esophagus demonstrate no significant findings. Lungs/Pleura: Interval development of near diffuse, peripherally predominant ground-glass pulmonary opacities with a mid lung and basal predominance. Similar appearing moderate upper lobe predominant paraseptal and centrilobular emphysema. No pleural effusion or pneumothorax. Upper Abdomen: The visualized upper abdomen is within normal limits. Musculoskeletal: No chest wall abnormality. No acute or significant osseous findings. Review of the MIP images confirms the above findings. IMPRESSION: Vascular: 1. No evidence of pulmonary embolism. 2. Severe coronary atherosclerotic calcifications.  Non-Vascular: 1. Diffuse, mid lung and basilar, peripherally predominant ground-glass opacities - findings compatible with multifocal pneumonia associated with COVID-19. 2. Similar appearing moderate upper lobe predominant centrilobular and paraseptal emphysema. Electronically Signed   By: Ruthann Cancer MD   On: 09/27/2020 14:31   DG Chest Port 1 View  Result Date: 10/21/2020 CLINICAL DATA:  Short of breath.  COVID positive EXAM: PORTABLE CHEST 1 VIEW COMPARISON:  None. FINDINGS: Low lung volumes. normal cardiac silhouette. There is bilateral diffuse fine airspace disease. No pleural fluid. No pneumothorax. IMPRESSION: Bilateral diffuse airspace disease consistent with viral pneumonia. Electronically Signed   By: Suzy Bouchard M.D.   On: 10/21/2020 08:17   DG Chest Port 1 View  Result Date: 10/06/2020 CLINICAL DATA:  COVID positive, pleural effusion, pneumothorax EXAM: PORTABLE CHEST 1 VIEW COMPARISON:  09/26/2020 FINDINGS: Slight interval increase in diffuse bilateral heterogeneous and interstitial airspace opacity, most conspicuous at the lung bases. No significant pleural effusion noted. No pneumothorax. The heart and mediastinum are unremarkable. IMPRESSION: 1. Slight interval increase in diffuse bilateral heterogeneous and interstitial airspace opacity, most conspicuous at the lung bases, consistent with worsened COVID airspace disease. 2.  No significant pleural effusion noted. No pneumothorax. Electronically Signed   By: Eddie Candle M.D.   On: 10/06/2020 09:37   DG CHEST PORT 1 VIEW  Result Date: 09/26/2020 CLINICAL DATA:  COVID-19 EXAM: PORTABLE CHEST 1 VIEW COMPARISON:  09/26/2020 FINDINGS: Slight worsening of bilateral basilar predominant opacities. No pleural effusion or pneumothorax. Normal cardiomediastinal contours. IMPRESSION: Slight worsening  of bilateral basilar predominant opacities. Electronically Signed   By: Ulyses Jarred M.D.   On: 09/26/2020 23:26   DG Chest Portable 1  View  Result Date: 09/26/2020 CLINICAL DATA:  Cough and shortness of breath. EXAM: PORTABLE CHEST 1 VIEW COMPARISON:  12/04/2019 FINDINGS: Cardiomegaly which is increased from prior exam. Pulmonary edema, moderate in degree. There are bilateral pleural effusions. No confluent consolidation. No pneumothorax. No acute osseous abnormalities are seen. IMPRESSION: CHF with cardiomegaly, pulmonary edema and bilateral pleural effusions. Electronically Signed   By: Keith Rake M.D.   On: 09/26/2020 19:14   DG Chest Port 1V same Day  Result Date: 10/16/2020 CLINICAL DATA:  COVID positive, short of breath EXAM: PORTABLE CHEST 1 VIEW COMPARISON:  None. FINDINGS: Normal cardiac silhouette. Bilateral peripheral fine airspace disease appears slightly less dense than comparison exam. No pneumothorax. No focal consolidation. IMPRESSION: Mild improvement in bilateral multifocal viral pneumonia. Electronically Signed   By: Suzy Bouchard M.D.   On: 10/16/2020 13:32   DG Chest Port 1V same Day  Result Date: 10/13/2020 CLINICAL DATA:  55 year old male with respiratory failure. Recent bronchoscopy and hemoptysis. History of COVID-19. EXAM: PORTABLE CHEST 1 VIEW COMPARISON:  Portable chest 10/11/2020 and earlier. FINDINGS: Portable AP semi upright view at 0554 hours. Lower lung volumes. Stable cardiac size and mediastinal contours. Visualized tracheal air column is within normal limits. Diffuse reticulonodular pulmonary opacity with confluent density in the peripheral right lung and at the lung bases now worse on the right. No pneumothorax. No definite pleural effusion. Stable visualized osseous structures. IMPRESSION: Continued diffuse reticulonodular opacity with lower lung volumes and worsening ventilation since 10/11/2020 at both lung bases. Electronically Signed   By: Genevie Ann M.D.   On: 10/13/2020 07:57   DG Chest Port 1V same Day  Result Date: 10/11/2020 CLINICAL DATA:  Hypoxia.  Shortness of breath. EXAM:  PORTABLE CHEST 1 VIEW COMPARISON:  October 06, 2020. FINDINGS: The heart size and mediastinal contours are within normal limits. No pneumothorax or pleural effusion is noted. Stable bilateral lung opacities are noted concerning for multifocal pneumonia. The visualized skeletal structures are unremarkable. IMPRESSION: Stable bilateral lung opacities are noted concerning for multifocal pneumonia. Electronically Signed   By: Marijo Conception M.D.   On: 10/11/2020 10:06   VAS [place] LOWER EXTREMITY VENOUS (DVT)  Result Date: 10/12/2020  Lower Venous DVT Study Indications: Worsening hypoxia, Covid-19.  Comparison Study: No prior studies. Performing Technologist: Darlin Coco, RDMS  Examination Guidelines: A complete evaluation includes B-mode imaging, spectral Doppler, color Doppler, and power Doppler as needed of all accessible portions of each vessel. Bilateral testing is considered an integral part of a complete examination. Limited examinations for reoccurring indications may be performed as noted. The reflux portion of the exam is performed with the patient in reverse Trendelenburg.  +---------+---------------+---------+-----------+----------+--------------+ RIGHT    CompressibilityPhasicitySpontaneityPropertiesThrombus Aging +---------+---------------+---------+-----------+----------+--------------+ CFV      Full           Yes      Yes                                 +---------+---------------+---------+-----------+----------+--------------+ SFJ      Full                                                        +---------+---------------+---------+-----------+----------+--------------+  FV Prox  Full                                                        +---------+---------------+---------+-----------+----------+--------------+ FV Mid   Full                                                        +---------+---------------+---------+-----------+----------+--------------+ FV  DistalFull                                                        +---------+---------------+---------+-----------+----------+--------------+ PFV      Full                                                        +---------+---------------+---------+-----------+----------+--------------+ POP      Full           Yes      Yes                                 +---------+---------------+---------+-----------+----------+--------------+ PTV      Full                                                        +---------+---------------+---------+-----------+----------+--------------+ PERO     Full                                                        +---------+---------------+---------+-----------+----------+--------------+   +---------+---------------+---------+-----------+----------+--------------+ LEFT     CompressibilityPhasicitySpontaneityPropertiesThrombus Aging +---------+---------------+---------+-----------+----------+--------------+ CFV      Full           Yes      Yes                                 +---------+---------------+---------+-----------+----------+--------------+ SFJ      Full                                                        +---------+---------------+---------+-----------+----------+--------------+ FV Prox  Full                                                        +---------+---------------+---------+-----------+----------+--------------+  FV Mid   Full                                                        +---------+---------------+---------+-----------+----------+--------------+ FV DistalFull                                                        +---------+---------------+---------+-----------+----------+--------------+ PFV      Full                                                        +---------+---------------+---------+-----------+----------+--------------+ POP      Full           Yes      Yes                                  +---------+---------------+---------+-----------+----------+--------------+ PTV      Full                                                        +---------+---------------+---------+-----------+----------+--------------+ PERO     Full                                                        +---------+---------------+---------+-----------+----------+--------------+     Summary: RIGHT: - There is no evidence of deep vein thrombosis in the lower extremity.  - No cystic structure found in the popliteal fossa.  LEFT: - There is no evidence of deep vein thrombosis in the lower extremity.  - No cystic structure found in the popliteal fossa.  *See table(s) above for measurements and observations. Electronically signed by Ruta Hinds MD on 10/12/2020 at 10:00:37 AM.    Final    ECHOCARDIOGRAM LIMITED  Result Date: 09/28/2020    ECHOCARDIOGRAM LIMITED REPORT   Patient Name:   Matthew [name] Date of Exam: 09/28/2020 Medical Rec #:  [id number]          Height:       71.0 in Accession #:    [id number]         Weight:       244.3 lb Date of Birth:  01-20-1966          BSA:          2.296 m Patient Age:    39 years           BP:           120/84 mmHg Patient Gender: M                  HR:  73 bpm. Exam Location:  Inpatient Procedure: Limited Echo, Limited Color Doppler and Cardiac Doppler Indications:    acute respiratory distress  History:        Patient has prior history of Echocardiogram examinations, most                 recent 03/30/2016. CAD; Covid.  Sonographer:    Johny Chess Referring Phys: [id number] Candace Gallus MELVIN  Sonographer Comments: Image acquisition challenging due to respiratory motion. IMPRESSIONS  1. Left ventricular ejection fraction, by estimation, is 60 to 65%. The left ventricle has normal function. The left ventricle has no regional wall motion abnormalities. There is mild left ventricular hypertrophy.  2. Right ventricular systolic function is  normal. The right ventricular size is normal.  3. The mitral valve is normal in structure. No evidence of mitral valve regurgitation. No evidence of mitral stenosis.  4. The aortic valve is tricuspid.  5. The inferior vena cava is normal in size with greater than 50% respiratory variability, suggesting right atrial pressure of 3 mmHg. FINDINGS  Left Ventricle: Left ventricular ejection fraction, by estimation, is 60 to 65%. The left ventricle has normal function. The left ventricle has no regional wall motion abnormalities. The left ventricular internal cavity size was normal in size. There is  mild left ventricular hypertrophy. Right Ventricle: The right ventricular size is normal. No increase in right ventricular wall thickness. Right ventricular systolic function is normal. Pericardium: There is no evidence of pericardial effusion. Mitral Valve: The mitral valve is normal in structure. No evidence of mitral valve stenosis. Tricuspid Valve: The tricuspid valve is normal in structure. Tricuspid valve regurgitation is not demonstrated. No evidence of tricuspid stenosis. Aortic Valve: The aortic valve is tricuspid. Pulmonic Valve: The pulmonic valve was not well visualized. Pulmonic valve regurgitation is not visualized. No evidence of pulmonic stenosis. Aorta: The aortic root is normal in size and structure. Pulmonary Artery: Indeterminant PASP, inadequate TR jet. Venous: The inferior vena cava is normal in size with greater than 50% respiratory variability, suggesting right atrial pressure of 3 mmHg. IAS/Shunts: No atrial level shunt detected by color flow Doppler. LEFT VENTRICLE PLAX 2D LVIDd:         4.20 cm LVIDs:         2.80 cm LV PW:         1.30 cm LV IVS:        1.10 cm LVOT diam:     2.00 cm LV SV:         71 LV SV Index:   31 LVOT Area:     3.14 cm  IVC IVC diam: 1.70 cm LEFT ATRIUM         Index LA diam:    4.30 cm 1.87 cm/m  AORTIC VALVE LVOT Vmax:   116.00 cm/s LVOT Vmean:  76.100 cm/s LVOT VTI:     0.227 m  AORTA Ao Root diam: 3.10 cm MITRAL VALVE MV Area (PHT): 2.91 cm    SHUNTS MV Decel Time: 261 msec    Systemic VTI:  0.23 m MV E velocity: 75.40 cm/s  Systemic Diam: 2.00 cm MV A velocity: 66.00 cm/s MV E/A ratio:  1.14 Carlyle Dolly MD Electronically signed by Carlyle Dolly MD Signature Date/Time: 09/28/2020/2:52:33 PM    Final

## 2020-10-22 LAB — D-DIMER, QUANTITATIVE: D-Dimer, Quant: 1.23 ug/mL-FEU — ABNORMAL HIGH (ref 0.00–0.50)

## 2020-10-22 LAB — COMPREHENSIVE METABOLIC PANEL
ALT: 63 U/L — ABNORMAL HIGH (ref 0–44)
AST: 17 U/L (ref 15–41)
Albumin: 2.4 g/dL — ABNORMAL LOW (ref 3.5–5.0)
Alkaline Phosphatase: 98 U/L (ref 38–126)
Anion gap: 11 (ref 5–15)
BUN: 23 mg/dL — ABNORMAL HIGH (ref 6–20)
CO2: 25 mmol/L (ref 22–32)
Calcium: 8.5 mg/dL — ABNORMAL LOW (ref 8.9–10.3)
Chloride: 100 mmol/L (ref 98–111)
Creatinine, Ser: 0.92 mg/dL (ref 0.61–1.24)
GFR, Estimated: >60 mL/min (ref >60)
Glucose, Bld: 104 mg/dL — ABNORMAL HIGH (ref 70–99)
Potassium: 3.7 mmol/L (ref 3.5–5.1)
Sodium: 136 mmol/L (ref 135–145)
Total Bilirubin: 1.2 mg/dL (ref 0.3–1.2)
Total Protein: 6.8 g/dL (ref 6.5–8.1)

## 2020-10-22 LAB — CBC
HCT: 36.2 % — ABNORMAL LOW (ref 39.0–52.0)
Hemoglobin: 12.5 g/dL — ABNORMAL LOW (ref 13.0–17.0)
MCH: 31.5 pg (ref 26.0–34.0)
MCHC: 34.5 g/dL (ref 30.0–36.0)
MCV: 91.2 fL (ref 80.0–100.0)
Platelets: 181 10*3/uL (ref 150–400)
RBC: 3.97 MIL/uL — ABNORMAL LOW (ref 4.22–5.81)
RDW: 13.4 % (ref 11.5–15.5)
WBC: 14.8 10*3/uL — ABNORMAL HIGH (ref 4.0–10.5)
nRBC: 0 % (ref 0.0–0.2)

## 2020-10-22 LAB — C-REACTIVE PROTEIN: CRP: 12.9 mg/dL — ABNORMAL HIGH (ref <1.0)

## 2020-10-22 LAB — GLUCOSE, CAPILLARY: Glucose-Capillary: 205 mg/dL — ABNORMAL HIGH (ref 70–99)

## 2020-10-22 MED ORDER — PREDNISONE 20 MG PO TABS
20.0000 mg | ORAL_TABLET | Freq: Every day | ORAL | Status: DC
  Administered 2020-10-23 – 2020-10-25 (×3): 20 mg via ORAL
  Filled 2020-10-22 (×3): qty 1

## 2020-10-22 NOTE — Progress Notes (Signed)
Physical Therapy Treatment Patient Details Name: Matthew [name] MRN: [id number] DOB: 1966/01/15 Today's Date: 10/22/2020    History of Present Illness Pt is a 55 y.o. male admitted 09/26/20 with SOB and GI distress (had declined COVID test when seen in urgent care). Pt now testing (+) COVID-19. Workup for acute hypoxic respiratory failure due to acute COVID-19 viral PNA. Course complicated by hemoptysis, worsening hypoxemis requiring HHFNC; pt unable to lay flat for chest CTA. Negative DVT on 12/31. Repeat CXR 1/10 with diffuse bilateral airspace disease. PMH includes CAD, CHF, tobacco use (quit 11/2019); pt unvaccinated.   PT Comments    Pt motivated to participate this session and finally agreeable to progress mobility beyond bed<>chair. Pt able to walk ~20' with RW and close min guard; requiring prolonged seated rest breaks to recover SOB. Pt remains limited by anxiety related to SOB, requiring significantly increased time and effort in preparation to mobilize. Pt demonstrates improved recovery time of desaturation this session. Will continue to follow acutely.  SpO2 down to 80% on 50L O2 HHFNC + 15L NRB; returning to 94% with seated rest Pt left resting on 40L O2 HHFNC + 15L NRB with SpO2 >/88%    Follow Up Recommendations  Home health PT;Supervision for mobility/OOB (pending progress)     Equipment Recommendations   (TBD)    Recommendations for Other Services       Precautions / Restrictions Precautions Precautions: Fall;Other (comment) Precaution Comments: significant anxiety; watch SpO2 on 40-50L O2 HHFNC at 60% FiO2 Restrictions Weight Bearing Restrictions: No    Mobility  Bed Mobility               General bed mobility comments: Received sitting in recliner  Transfers Overall transfer level: Needs assistance Equipment used: Rolling walker (2 wheeled) Transfers: Sit to/from Stand Sit to Stand: Min guard Stand pivot transfers: +2 safety/equipment        General transfer comment: 2x sit<>stand from recliner to RW; close min guard for balance; poor eccentric control into sitting, limited by fatigue/SOB/anxiety post-walk and wanting to sit quickly  Ambulation/Gait Ambulation/Gait assistance: Min guard;+2 safety/equipment Gait Distance (Feet): 12 Feet (+8) Assistive device: Rolling walker (2 wheeled) Gait Pattern/deviations: Step-to pattern;Trunk flexed;Leaning posteriorly Gait velocity: Decreased   General Gait Details: Slow, labored gait with RW and close min guard for balance; pt ambulating 6' forwards and 6' backwards, return to sit with prolonged seated rest to recover DOE. Additional ambulation distance, 4' forwards + 4' back with prolonged seated rest break to recover. DOE 4/4 with SpO2 down to 80% on 50L O2 HHFNC   Stairs             Wheelchair Mobility    Modified Rankin (Stroke Patients Only)       Balance Overall balance assessment: Needs assistance Sitting-balance support: Feet supported;Feet unsupported Sitting balance-Leahy Scale: Good       Standing balance-Leahy Scale: Poor Standing balance comment: Reliant on UE support                            Cognition Arousal/Alertness: Awake/alert Behavior During Therapy: Anxious Overall Cognitive Status: Within Functional Limits for tasks assessed                                        Exercises      General Comments General comments (skin integrity, edema,  etc.): Resting SpO2 >/92% on 40L O2 HHFNC (60% FiO2) + 15L NRB; SpO2 down to 80% on 50L HHFNC + NRB with ambulation; return to 94% with 2-3 min seated rest (although pt taking longer to recover from SOB/anxiety)      Pertinent Vitals/Pain Pain Assessment: No/denies pain    Home Living                      Prior Function            PT Goals (current goals can now be found in the care plan section) Progress towards PT goals: Progressing toward goals     Frequency    Min 3X/week      PT Plan Current plan remains appropriate    Co-evaluation PT/OT/SLP Co-Evaluation/Treatment: Yes Reason for Co-Treatment: Complexity of the patient's impairments (multi-system involvement);Necessary to address cognition/behavior during functional activity;For patient/therapist safety (need for motivation due to lack of progress w/ therapies that past couple of weeks)          AM-PAC PT "6 Clicks" Mobility   Outcome Measure  Help needed turning from your back to your side while in a flat bed without using bedrails?: None Help needed moving from lying on your back to sitting on the side of a flat bed without using bedrails?: None Help needed moving to and from a bed to a chair (including a wheelchair)?: A Little Help needed standing up from a chair using your arms (e.g., wheelchair or bedside chair)?: A Little Help needed to walk in hospital room?: A Little Help needed climbing 3-5 steps with a railing? : A Lot 6 Click Score: 19    End of Session Equipment Utilized During Treatment: Oxygen Activity Tolerance: Patient tolerated treatment well;Treatment limited secondary to medical complications (Comment) (hypoxia/DOE/anxiety) Patient left: in chair;with call bell/phone within reach Nurse Communication: Mobility status PT Visit Diagnosis: Other abnormalities of gait and mobility (R26.89)     Time: 5697-9480 PT Time Calculation (min) (ACUTE ONLY): 42 min  Charges:  $Therapeutic Exercise: 8-22 mins $Therapeutic Activity: 8-22 mins                     Mabeline Caras, PT, DPT Acute Rehabilitation Services  Pager [phone] Office [place] 10/22/2020, 4:23 PM

## 2020-10-22 NOTE — Progress Notes (Signed)
   10/21/20 2024  Assess: MEWS Score  Temp 97.6 F (36.4 C)  BP 92/73  ECG Heart Rate 89  Resp (!) 22  Level of Consciousness Alert  SpO2 97 %  O2 Device HFNC;Non-rebreather Mask  Heater temperature 89.8 F (32.1 C)  O2 Flow Rate (L/min) 40 L/min  FiO2 (%) 80 %  Assess: MEWS Score  MEWS Temp 0  MEWS Systolic 1  MEWS Pulse 0  MEWS RR 1  MEWS LOC 0  MEWS Score 2  MEWS Score Color Yellow  Assess: if the MEWS score is Yellow or Red  Were vital signs taken at a resting state? Yes  Focused Assessment No change from prior assessment  Early Detection of Sepsis Score *See Row Information* Low  MEWS guidelines implemented *See Row Information* Yes  Treat  Pain Scale 0-10  Pain Score 0  Take Vital Signs  Increase Vital Sign Frequency  Yellow: Q 2hr X 2 then Q 4hr X 2, if remains yellow, continue Q 4hrs  Escalate  MEWS: Escalate Yellow: discuss with charge nurse/RN and consider discussing with provider and RRT  Notify: Charge Nurse/RN  Name of Charge Nurse/RN Notified Oswaldo Milian B  Date Charge Nurse/RN Notified 10/21/20  Time Charge Nurse/RN Notified 2030  Document  Patient Outcome Other (Comment) (remains tachypneic)  Progress note created (see row info) Yes

## 2020-10-22 NOTE — Progress Notes (Signed)
Occupational Therapy Treatment Patient Details Name: Matthew [name] MRN: [id number] DOB: 23-Oct-1965 Today's Date: 10/22/2020    History of present illness Pt is a 55 y.o. male admitted 09/26/20 with SOB and GI distress (had declined COVID test when seen in urgent care). Pt now testing (+) COVID-19. Workup for acute hypoxic respiratory failure due to acute COVID-19 viral PNA. Course complicated by hemoptysis, worsening hypoxemis requiring HHFNC; pt unable to lay flat for chest CTA. Negative DVT on 12/31. Repeat CXR 1/10 with diffuse bilateral airspace disease. PMH includes CAD, CHF, tobacco use (quit 11/2019); pt unvaccinated.   OT comments  Pt progressing towards OT goals, with much encouragement pt is agreeable to attempt mobilizing with PT/OT today. Pt tolerating x2 bouts of short distance mobility in room! He does require increased time for recovery but overall tolerating session well. Pt completing mobility tasks with minguard-minA (+2 safety/equipment) using RW. Pt initially on [place] (60%, 40L), with use of 50L during mobility tasks, lowest SpO2 80% during activity, requiring approx 2-4 min for recovery each round and increase to 55L during recovery time. Able to return pt to 40L end of session with SpO2 >/=91%. Will continue per current POC at this time to progress pt towards established OT goals.    Follow Up Recommendations  LTACH;Home health OT    Equipment Recommendations  Tub/shower bench          Precautions / Restrictions Precautions Precautions: Fall;Other (comment) Precaution Comments: significant anxiety; watch SpO2 on 40-50L O2 HHFNC at 60% FiO2 Restrictions Weight Bearing Restrictions: No       Mobility Bed Mobility               General bed mobility comments: Received sitting in recliner  Transfers Overall transfer level: Needs assistance Equipment used: Rolling walker (2 wheeled) Transfers: Sit to/from Stand Sit to Stand: Min guard;+2  safety/equipment Stand pivot transfers: +2 safety/equipment       General transfer comment: 2x sit<>stand from recliner to RW; close min guard for balance; poor eccentric control into sitting, limited by fatigue/SOB/anxiety post-walk and wanting to sit quickly    Balance Overall balance assessment: Needs assistance Sitting-balance support: Feet supported;Feet unsupported Sitting balance-Leahy Scale: Good       Standing balance-Leahy Scale: Poor Standing balance comment: Reliant on UE support                           ADL either performed or assessed with clinical judgement   ADL Overall ADL's : Needs assistance/impaired Eating/Feeding: Set up;Sitting                                   Functional mobility during ADLs: Minimal assistance;+2 for safety/equipment;Rolling walker General ADL Comments: focus on mobility tasks today, pt tolerating x2 bouts of short distance mobility in room     Vision       Perception     Praxis      Cognition Arousal/Alertness: Awake/alert Behavior During Therapy: Anxious Overall Cognitive Status: Within Functional Limits for tasks assessed                                 General Comments: use of music as distraction aiding in reducing anxiousness today        Exercises     Shoulder Instructions  General Comments Resting SpO2 >/92% on 40L O2 HHFNC (60% FiO2) + 15L NRB; SpO2 down to 80% on 50L HHFNC + NRB with ambulation; return to 94% with 2-3 min seated rest (although pt taking longer to recover from SOB/anxiety)    Pertinent Vitals/ Pain       Pain Assessment: No/denies pain  Home Living                                          Prior Functioning/Environment              Frequency  Min 3X/week        Progress Toward Goals  OT Goals(current goals can now be found in the care plan section)  Progress towards OT goals: Progressing toward goals  Acute  Rehab OT Goals Patient Stated Goal: to walk out of here OT Goal Formulation: With patient Time For Goal Achievement: 10/30/20 Potential to Achieve Goals: Good ADL Goals Pt Will Perform Lower Body Bathing: with modified independence;sit to/from stand Pt Will Perform Lower Body Dressing: with modified independence;sit to/from stand Pt Will Transfer to Toilet: with modified independence;ambulating Pt/caregiver will Perform Home Exercise Program: Increased strength;Both right and left upper extremity;With written HEP provided;Independently;With theraband Additional ADL Goal #1: Pt will independently verbalize 3 energy conservation strategies Additional ADL Goal #2: Pt will maintain SpO2 greater than 88 during functional activity Additional ADL Goal #3: Pt will demonstrate use of 2 anxiety managment strategies with min vc to help with mobility and ADL tasks  Plan Discharge plan remains appropriate    Co-evaluation      Reason for Co-Treatment: Complexity of the patient's impairments (multi-system involvement);For patient/therapist safety;To address functional/ADL transfers (need for motivation due to lack of progress w/ therapies that past couple of weeks)          AM-PAC OT "6 Clicks" Daily Activity     Outcome Measure   Help from another person eating meals?: A Little Help from another person taking care of personal grooming?: A Little Help from another person toileting, which includes using toliet, bedpan, or urinal?: A Little Help from another person bathing (including washing, rinsing, drying)?: A Little Help from another person to put on and taking off regular upper body clothing?: A Little Help from another person to put on and taking off regular lower body clothing?: A Little 6 Click Score: 18    End of Session Equipment Utilized During Treatment: Rolling walker;Oxygen  OT Visit Diagnosis: Unsteadiness on feet (R26.81);Other abnormalities of gait and mobility (R26.89);Muscle  weakness (generalized) (M62.81);Dizziness and giddiness (R42)   Activity Tolerance Patient tolerated treatment well   Patient Left in chair;with call bell/phone within reach   Nurse Communication Mobility status        Time: 1459-1541 OT Time Calculation (min): 42 min  Charges: OT General Charges $OT Visit: 1 Visit OT Treatments $Therapeutic Activity: 8-22 mins  Lou Cal, OT Acute Rehabilitation Services Pager [phone] Office [phone]    Raymondo Band 10/22/2020, 5:17 PM

## 2020-10-22 NOTE — Progress Notes (Signed)
PROGRESS NOTE                                                                                                                                                                                                             Patient Demographics:    Matthew [name], is a 55 y.o. male, DOB - [birth year], KZS:[id number]  Outpatient Primary MD for the patient is Patient, No Pcp Per   Admit date - 09/26/2020   LOS - 26  No chief complaint on file.      Brief Narrative: Patient is a 55 y.o. male with PMHx of  CAD s/p PCI, tobacco use, HLD, GERD-who presented with shortness of breath-found to have acute hypoxic respiratory failure requiring 100% O2 via NRB due to COVID-19 pneumonia.  COVID-19 vaccinated status: Unvaccinated  Significant Events: 12/9>> started having GI symptoms including diarrhea 12/13>> to urgent care-for diarrhea-discharged after supportive care/IV fluids 12/16>> Admit to [facility] for hypoxia due to COVID-19 pneumonia-requiring 100% O2 via NRB 12/28>> hemoptysis-PCCM consulted-supportive care recommended. 12/31>> worsening hypoxemia-started on heated high flow-unable to lie flat for a CTA chest  Significant studies: 12/17>> CTA chest: No PE, diffuse groundglass opacities 12/18>> Echo: EF 60-65% 12/26>> chest x-ray: Diffuse bilateral heterogeneous/interstitial airspace opacities 12/31>> chest x-ray: Stable bilateral lung opacities 12/31>> lower extremity Doppler: No DVT 1/5>> chest x-ray: Mild improvement in multifocal opacities 1/10>> chest x-ray: Diffuse bilateral airspace disease   COVID-19 medications: Steroids: 12/16>> Remdesivir: 12/16>> 12/20 Baricitinib: 12/17>>12/30  Antibiotics: None  Microbiology data: None  Procedures: None  Consults: PCCM  DVT prophylaxis: Lovenox at BID dosing    Subjective:   FiO2/flow rate slowly improving-continues to have significant anxiety symptoms-but has  been gradually improving over the past few days with slow decrease in the amount of oxygen he has been requiring.   Assessment  & Plan :   Acute Hypoxic Resp Failure due to Covid 19 Viral pneumonia: Continues to have severe hypoxemia-on heated high flow but slow/gradual decrease in oxygen requirements over the past few days-anxiety continues to be a major issue but more or less controlled with Lexapro/Klonopin.  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  Continue to titrate down prednisone even further.  Once his oxygen requirements are further improved-suspect we could move him out of  isolation to a non-COVID unit.   Fever: afebrile O2 requirements:  SpO2: 99 % O2 Flow Rate (L/min): 40 L/min FiO2 (%): 60 %   COVID-19 Labs: Recent Labs    10/20/20 0746 10/21/20 0147 10/22/20 0225  DDIMER 1.71* 1.20* 1.23*  CRP 12.5* 13.3* 12.9*       Component Value Date/Time   BNP 53.8 10/11/2020 1024    No results for input(s): PROCALCITON in the last 168 hours.  Lab Results  Component Value Date   SARSCOV2NAA POSITIVE (A) 09/26/2020   [place] NEGATIVE 02/06/2020   [place] NEGATIVE 11/17/2019    Prone/Incentive Spirometry: encouraged  incentive spirometry use 3-4/hour.  Mildly elevated D-dimer: Secondary to COVID-19 related inflammation-hypoxia has not worsened-he remains stable-has been on prophylactic Lovenox since admission-CTA chest/lower extremity Dopplers have been negative.  On twice daily dosing of Lovenox.  Acute on chronic diastolic heart failure: No evidence of volume overload-supportive care-keep in negative balance-continue to dose IV Lasix intermittently.  Suspect I/os inaccurate-Weight down to 210 pounds (250 pounds on admission)!!  Hemoptysis: Isolated episode that occurred on 12/28-likely related to underlying parenchymal injury from COVID-19 pneumonia.  No further episodes since  then.  Continue to monitor closely-if reoccurs-we will reconsult PCCM.  Transaminitis: Due to COVID-19-downtrending-follow periodically  CAD s/p PCI: No anginal symptoms-not on any antiplatelets at home.  Per patient report-PCI was approximately 11 years back  GERD: Continue PPI  Anxiety: Continues to have significant amount of anxiety-remains on Lexapro/Klonopin-dosage of Lexapro has been gradually increased to 15 mg-on 1 mg of Klonopin 3 times daily as needed.  Constipation-continue MiraLAX  Obesity: Estimated body mass index is 29.43 kg/m as calculated from the following:   Height as of this encounter: 5\' 11"  (1.803 m).   Weight as of this encounter: 95.7 kg.    GI prophylaxis: PPI  ABG:    Component Value Date/Time   PHART 7.464 (H) 09/26/2020 2035   PCO2ART 31.7 (L) 09/26/2020 2035   PO2ART 90 09/26/2020 2035   HCO3 22.8 09/26/2020 2035   TCO2 24 09/26/2020 2035   ACIDBASEDEF 0.8 11/17/2019 1618   O2SAT 98.0 09/26/2020 2035    Vent Settings: N/A FiO2 (%):  [60 %-80 %] 60 %  Condition - Guarded  Family Communication  :  Sherlyn Lees Loux [id number] 1/11  Code Status :  Full Code  Diet :  Diet Order            Diet Heart Room service appropriate? No; Fluid consistency: Thin; Fluid restriction: 2000 mL Fluid  Diet effective now                  Disposition Plan  :   Status is: Inpatient  Remains inpatient appropriate because:Inpatient level of care appropriate due to severity of illness   Dispo: The patient is from: Home              Anticipated d/c is to: Home              Anticipated d/c date is: > 3 days              Patient currently is not medically stable to d/c.   Barriers to discharge: Hypoxia requiring O2 supplementation  Antimicorbials  :    Anti-infectives (From admission, onward)   Start     Dose/Rate Route Frequency Ordered Stop   09/27/20 1000  remdesivir 100 mg in sodium chloride 0.9 % 100 mL IVPB       "Followed by"  Linked  Group Details   100 mg 200 mL/hr over 30 Minutes Intravenous Daily 09/26/20 2208 09/30/20 1900   09/26/20 2215  remdesivir 200 mg in sodium chloride 0.9% 250 mL IVPB       "Followed by" Linked Group Details   200 mg 580 mL/hr over 30 Minutes Intravenous Once 09/26/20 2208 09/27/20 0146      Inpatient Medications  Scheduled Meds: . benzonatate  200 mg Oral TID  . enoxaparin (LOVENOX) injection  50 mg Subcutaneous Q12H  . escitalopram  15 mg Oral Daily  . melatonin  3 mg Oral QHS  . multivitamin with minerals  1 tablet Oral Daily  . pantoprazole  40 mg Oral Daily  . polyethylene glycol  17 g Oral Daily  . predniSONE  30 mg Oral Q breakfast  . sodium chloride flush  10-40 mL Intracatheter Q12H  . sodium chloride flush  3 mL Intravenous Q12H   Continuous Infusions:  PRN Meds:.acetaminophen, albuterol, bisacodyl, clonazePAM, guaiFENesin-dextromethorphan, menthol-cetylpyridinium, ondansetron (ZOFRAN) IV, senna-docusate, sodium chloride, sodium chloride flush   Time Spent in minutes  25   See all Orders from today for further details   Oren Binet M.D on 10/22/2020 at 11:07 AM  To page go to [email or web address] - use universal password  [facility] -  Office  [phone]    Objective:   Vitals:   10/22/20 0600 10/22/20 0805 10/22/20 0822 10/22/20 0830  BP:  96/60    Pulse:  84    Resp:  18    Temp:  97.7 F (36.5 C)    TempSrc:  Axillary    SpO2:  99% 95% 99%  Weight: 95.7 kg     Height:        Wt Readings from Last 3 Encounters:  10/22/20 95.7 kg  12/04/19 108.4 kg  11/20/19 107.9 kg     Intake/Output Summary (Last 24 hours) at 10/22/2020 1107 Last data filed at 10/22/2020 0600 Gross per 24 hour  Intake 180 ml  Output 350 ml  Net -170 ml     Physical Exam Gen Exam:Alert awake-not in any distress HEENT:atraumatic, normocephalic Chest: B/L clear to auscultation anteriorly CVS:S1S2 regular Abdomen:soft non tender, non  distended Extremities:no edema Neurology: Non focal Skin: no rash  Data Review:    CBC Recent Labs  Lab 10/18/20 0657 10/19/20 0215 10/20/20 0746 10/21/20 0147 10/22/20 0225  WBC 15.9* 15.4* 16.6* 14.1* 14.8*  HGB 14.6 13.9 12.9* 12.7* 12.5*  HCT 41.6 39.5 36.9* 36.2* 36.2*  PLT 160 134* 153 165 181  MCV 90.4 90.0 90.4 91.0 91.2  MCH 31.7 31.7 31.6 31.9 31.5  MCHC 35.1 35.2 35.0 35.1 34.5  RDW 13.2 13.2 13.4 13.3 13.4    Chemistries  Recent Labs  Lab 10/18/20 0657 10/19/20 0215 10/20/20 0746 10/21/20 0147 10/22/20 0225  NA 137 135 135 133* 136  K 3.9 3.4* 3.6 3.7 3.7  CL 97* 98 100 98 100  CO2 27 24 24 25 25   GLUCOSE 129* 168* 126* 115* 104*  BUN 29* 27* 27* 25* 23*  CREATININE 1.00 0.99 0.90 0.83 0.92  CALCIUM 8.8* 8.3* 8.5* 8.3* 8.5*  AST 32 30 21 16 17   ALT 123* 129* 96* 75* 63*  ALKPHOS 91 85 86 89 98  BILITOT 1.1 1.2 1.0 0.8 1.2   ------------------------------------------------------------------------------------------------------------------ No results for input(s): CHOL, HDL, LDLCALC, TRIG, CHOLHDL, LDLDIRECT in the last 72 hours.  No results found for: HGBA1C ------------------------------------------------------------------------------------------------------------------ No results for input(s): TSH, T4TOTAL, T3FREE, THYROIDAB in the last  72 hours.  Invalid input(s): FREET3 ------------------------------------------------------------------------------------------------------------------ No results for input(s): VITAMINB12, FOLATE, FERRITIN, TIBC, IRON, RETICCTPCT in the last 72 hours.  Coagulation profile No results for input(s): INR, PROTIME in the last 168 hours.  Recent Labs    10/21/20 0147 10/22/20 0225  DDIMER 1.20* 1.23*    Cardiac Enzymes No results for input(s): CKMB, TROPONINI, MYOGLOBIN in the last 168 hours.  Invalid input(s):  CK ------------------------------------------------------------------------------------------------------------------    Component Value Date/Time   BNP 53.8 10/11/2020 1024    Micro Results No results found for this or any previous visit (from the past 240 hour(s)).  Radiology Reports CT ANGIO CHEST PE W OR WO CONTRAST  Result Date: 09/27/2020 CLINICAL DATA:  55 year old male with history of COVID-19, suspected pulmonary embolism. EXAM: CT ANGIOGRAPHY CHEST WITH CONTRAST TECHNIQUE: Multidetector CT imaging of the chest was performed using the standard protocol during bolus administration of intravenous contrast. Multiplanar CT image reconstructions and MIPs were obtained to evaluate the vascular anatomy. CONTRAST:  100 mL Omnipaque 350, intravenous COMPARISON:  Chest CT from 11/17/2019 FINDINGS: Cardiovascular: Satisfactory opacification of the pulmonary arteries to the segmental level. No evidence of pulmonary embolism. Normal heart size. No pericardial effusion. The heart is normal in size. No pericardial effusion. Severe coronary atherosclerotic calcifications. Mediastinum/Nodes: Similar appearing prominent right paratracheal lymph node measuring up to 1.2 cm in short axis. Otherwise no enlarged mediastinal, hilar, or axillary lymph nodes. Thyroid gland, trachea, and esophagus demonstrate no significant findings. Lungs/Pleura: Interval development of near diffuse, peripherally predominant ground-glass pulmonary opacities with a mid lung and basal predominance. Similar appearing moderate upper lobe predominant paraseptal and centrilobular emphysema. No pleural effusion or pneumothorax. Upper Abdomen: The visualized upper abdomen is within normal limits. Musculoskeletal: No chest wall abnormality. No acute or significant osseous findings. Review of the MIP images confirms the above findings. IMPRESSION: Vascular: 1. No evidence of pulmonary embolism. 2. Severe coronary atherosclerotic calcifications.  Non-Vascular: 1. Diffuse, mid lung and basilar, peripherally predominant ground-glass opacities - findings compatible with multifocal pneumonia associated with COVID-19. 2. Similar appearing moderate upper lobe predominant centrilobular and paraseptal emphysema. Electronically Signed   By: Ruthann Cancer MD   On: 09/27/2020 14:31   DG Chest Port 1 View  Result Date: 10/21/2020 CLINICAL DATA:  Short of breath.  COVID positive EXAM: PORTABLE CHEST 1 VIEW COMPARISON:  None. FINDINGS: Low lung volumes. normal cardiac silhouette. There is bilateral diffuse fine airspace disease. No pleural fluid. No pneumothorax. IMPRESSION: Bilateral diffuse airspace disease consistent with viral pneumonia. Electronically Signed   By: Suzy Bouchard M.D.   On: 10/21/2020 08:17   DG Chest Port 1 View  Result Date: 10/06/2020 CLINICAL DATA:  COVID positive, pleural effusion, pneumothorax EXAM: PORTABLE CHEST 1 VIEW COMPARISON:  09/26/2020 FINDINGS: Slight interval increase in diffuse bilateral heterogeneous and interstitial airspace opacity, most conspicuous at the lung bases. No significant pleural effusion noted. No pneumothorax. The heart and mediastinum are unremarkable. IMPRESSION: 1. Slight interval increase in diffuse bilateral heterogeneous and interstitial airspace opacity, most conspicuous at the lung bases, consistent with worsened COVID airspace disease. 2.  No significant pleural effusion noted. No pneumothorax. Electronically Signed   By: Eddie Candle M.D.   On: 10/06/2020 09:37   DG CHEST PORT 1 VIEW  Result Date: 09/26/2020 CLINICAL DATA:  COVID-19 EXAM: PORTABLE CHEST 1 VIEW COMPARISON:  09/26/2020 FINDINGS: Slight worsening of bilateral basilar predominant opacities. No pleural effusion or pneumothorax. Normal cardiomediastinal contours. IMPRESSION: Slight worsening of bilateral basilar predominant opacities. Electronically Signed  By: Ulyses Jarred M.D.   On: 09/26/2020 23:26   DG Chest Portable 1  View  Result Date: 09/26/2020 CLINICAL DATA:  Cough and shortness of breath. EXAM: PORTABLE CHEST 1 VIEW COMPARISON:  12/04/2019 FINDINGS: Cardiomegaly which is increased from prior exam. Pulmonary edema, moderate in degree. There are bilateral pleural effusions. No confluent consolidation. No pneumothorax. No acute osseous abnormalities are seen. IMPRESSION: CHF with cardiomegaly, pulmonary edema and bilateral pleural effusions. Electronically Signed   By: Keith Rake M.D.   On: 09/26/2020 19:14   DG Chest Port 1V same Day  Result Date: 10/16/2020 CLINICAL DATA:  COVID positive, short of breath EXAM: PORTABLE CHEST 1 VIEW COMPARISON:  None. FINDINGS: Normal cardiac silhouette. Bilateral peripheral fine airspace disease appears slightly less dense than comparison exam. No pneumothorax. No focal consolidation. IMPRESSION: Mild improvement in bilateral multifocal viral pneumonia. Electronically Signed   By: Suzy Bouchard M.D.   On: 10/16/2020 13:32   DG Chest Port 1V same Day  Result Date: 10/13/2020 CLINICAL DATA:  55 year old male with respiratory failure. Recent bronchoscopy and hemoptysis. History of COVID-19. EXAM: PORTABLE CHEST 1 VIEW COMPARISON:  Portable chest 10/11/2020 and earlier. FINDINGS: Portable AP semi upright view at 0554 hours. Lower lung volumes. Stable cardiac size and mediastinal contours. Visualized tracheal air column is within normal limits. Diffuse reticulonodular pulmonary opacity with confluent density in the peripheral right lung and at the lung bases now worse on the right. No pneumothorax. No definite pleural effusion. Stable visualized osseous structures. IMPRESSION: Continued diffuse reticulonodular opacity with lower lung volumes and worsening ventilation since 10/11/2020 at both lung bases. Electronically Signed   By: Genevie Ann M.D.   On: 10/13/2020 07:57   DG Chest Port 1V same Day  Result Date: 10/11/2020 CLINICAL DATA:  Hypoxia.  Shortness of breath. EXAM:  PORTABLE CHEST 1 VIEW COMPARISON:  October 06, 2020. FINDINGS: The heart size and mediastinal contours are within normal limits. No pneumothorax or pleural effusion is noted. Stable bilateral lung opacities are noted concerning for multifocal pneumonia. The visualized skeletal structures are unremarkable. IMPRESSION: Stable bilateral lung opacities are noted concerning for multifocal pneumonia. Electronically Signed   By: Marijo Conception M.D.   On: 10/11/2020 10:06   VAS [place] LOWER EXTREMITY VENOUS (DVT)  Result Date: 10/12/2020  Lower Venous DVT Study Indications: Worsening hypoxia, Covid-19.  Comparison Study: No prior studies. Performing Technologist: Darlin Coco, RDMS  Examination Guidelines: A complete evaluation includes B-mode imaging, spectral Doppler, color Doppler, and power Doppler as needed of all accessible portions of each vessel. Bilateral testing is considered an integral part of a complete examination. Limited examinations for reoccurring indications may be performed as noted. The reflux portion of the exam is performed with the patient in reverse Trendelenburg.  +---------+---------------+---------+-----------+----------+--------------+ RIGHT    CompressibilityPhasicitySpontaneityPropertiesThrombus Aging +---------+---------------+---------+-----------+----------+--------------+ CFV      Full           Yes      Yes                                 +---------+---------------+---------+-----------+----------+--------------+ SFJ      Full                                                        +---------+---------------+---------+-----------+----------+--------------+  FV Prox  Full                                                        +---------+---------------+---------+-----------+----------+--------------+ FV Mid   Full                                                        +---------+---------------+---------+-----------+----------+--------------+ FV  DistalFull                                                        +---------+---------------+---------+-----------+----------+--------------+ PFV      Full                                                        +---------+---------------+---------+-----------+----------+--------------+ POP      Full           Yes      Yes                                 +---------+---------------+---------+-----------+----------+--------------+ PTV      Full                                                        +---------+---------------+---------+-----------+----------+--------------+ PERO     Full                                                        +---------+---------------+---------+-----------+----------+--------------+   +---------+---------------+---------+-----------+----------+--------------+ LEFT     CompressibilityPhasicitySpontaneityPropertiesThrombus Aging +---------+---------------+---------+-----------+----------+--------------+ CFV      Full           Yes      Yes                                 +---------+---------------+---------+-----------+----------+--------------+ SFJ      Full                                                        +---------+---------------+---------+-----------+----------+--------------+ FV Prox  Full                                                        +---------+---------------+---------+-----------+----------+--------------+  FV Mid   Full                                                        +---------+---------------+---------+-----------+----------+--------------+ FV DistalFull                                                        +---------+---------------+---------+-----------+----------+--------------+ PFV      Full                                                        +---------+---------------+---------+-----------+----------+--------------+ POP      Full           Yes      Yes                                  +---------+---------------+---------+-----------+----------+--------------+ PTV      Full                                                        +---------+---------------+---------+-----------+----------+--------------+ PERO     Full                                                        +---------+---------------+---------+-----------+----------+--------------+     Summary: RIGHT: - There is no evidence of deep vein thrombosis in the lower extremity.  - No cystic structure found in the popliteal fossa.  LEFT: - There is no evidence of deep vein thrombosis in the lower extremity.  - No cystic structure found in the popliteal fossa.  *See table(s) above for measurements and observations. Electronically signed by Ruta Hinds MD on 10/12/2020 at 10:00:37 AM.    Final    ECHOCARDIOGRAM LIMITED  Result Date: 09/28/2020    ECHOCARDIOGRAM LIMITED REPORT   Patient Name:   Matthew [name] Date of Exam: 09/28/2020 Medical Rec #:  [id number]          Height:       71.0 in Accession #:    [id number]         Weight:       244.3 lb Date of Birth:  07/21/1966          BSA:          2.296 m Patient Age:    17 years           BP:           120/84 mmHg Patient Gender: M                  HR:  73 bpm. Exam Location:  Inpatient Procedure: Limited Echo, Limited Color Doppler and Cardiac Doppler Indications:    acute respiratory distress  History:        Patient has prior history of Echocardiogram examinations, most                 recent 03/30/2016. CAD; Covid.  Sonographer:    Johny Chess Referring Phys: [id number] Candace Gallus MELVIN  Sonographer Comments: Image acquisition challenging due to respiratory motion. IMPRESSIONS  1. Left ventricular ejection fraction, by estimation, is 60 to 65%. The left ventricle has normal function. The left ventricle has no regional wall motion abnormalities. There is mild left ventricular hypertrophy.  2. Right ventricular systolic function is  normal. The right ventricular size is normal.  3. The mitral valve is normal in structure. No evidence of mitral valve regurgitation. No evidence of mitral stenosis.  4. The aortic valve is tricuspid.  5. The inferior vena cava is normal in size with greater than 50% respiratory variability, suggesting right atrial pressure of 3 mmHg. FINDINGS  Left Ventricle: Left ventricular ejection fraction, by estimation, is 60 to 65%. The left ventricle has normal function. The left ventricle has no regional wall motion abnormalities. The left ventricular internal cavity size was normal in size. There is  mild left ventricular hypertrophy. Right Ventricle: The right ventricular size is normal. No increase in right ventricular wall thickness. Right ventricular systolic function is normal. Pericardium: There is no evidence of pericardial effusion. Mitral Valve: The mitral valve is normal in structure. No evidence of mitral valve stenosis. Tricuspid Valve: The tricuspid valve is normal in structure. Tricuspid valve regurgitation is not demonstrated. No evidence of tricuspid stenosis. Aortic Valve: The aortic valve is tricuspid. Pulmonic Valve: The pulmonic valve was not well visualized. Pulmonic valve regurgitation is not visualized. No evidence of pulmonic stenosis. Aorta: The aortic root is normal in size and structure. Pulmonary Artery: Indeterminant PASP, inadequate TR jet. Venous: The inferior vena cava is normal in size with greater than 50% respiratory variability, suggesting right atrial pressure of 3 mmHg. IAS/Shunts: No atrial level shunt detected by color flow Doppler. LEFT VENTRICLE PLAX 2D LVIDd:         4.20 cm LVIDs:         2.80 cm LV PW:         1.30 cm LV IVS:        1.10 cm LVOT diam:     2.00 cm LV SV:         71 LV SV Index:   31 LVOT Area:     3.14 cm  IVC IVC diam: 1.70 cm LEFT ATRIUM         Index LA diam:    4.30 cm 1.87 cm/m  AORTIC VALVE LVOT Vmax:   116.00 cm/s LVOT Vmean:  76.100 cm/s LVOT VTI:     0.227 m  AORTA Ao Root diam: 3.10 cm MITRAL VALVE MV Area (PHT): 2.91 cm    SHUNTS MV Decel Time: 261 msec    Systemic VTI:  0.23 m MV E velocity: 75.40 cm/s  Systemic Diam: 2.00 cm MV A velocity: 66.00 cm/s MV E/A ratio:  1.14 Carlyle Dolly MD Electronically signed by Carlyle Dolly MD Signature Date/Time: 09/28/2020/2:52:33 PM    Final

## 2020-10-23 LAB — COMPREHENSIVE METABOLIC PANEL
ALT: 59 U/L — ABNORMAL HIGH (ref 0–44)
AST: 20 U/L (ref 15–41)
Albumin: 2.4 g/dL — ABNORMAL LOW (ref 3.5–5.0)
Alkaline Phosphatase: 91 U/L (ref 38–126)
Anion gap: 12 (ref 5–15)
BUN: 23 mg/dL — ABNORMAL HIGH (ref 6–20)
CO2: 25 mmol/L (ref 22–32)
Calcium: 8.4 mg/dL — ABNORMAL LOW (ref 8.9–10.3)
Chloride: 100 mmol/L (ref 98–111)
Creatinine, Ser: 0.9 mg/dL (ref 0.61–1.24)
GFR, Estimated: >60 mL/min (ref >60)
Glucose, Bld: 121 mg/dL — ABNORMAL HIGH (ref 70–99)
Potassium: 3.4 mmol/L — ABNORMAL LOW (ref 3.5–5.1)
Sodium: 137 mmol/L (ref 135–145)
Total Bilirubin: 1 mg/dL (ref 0.3–1.2)
Total Protein: 6.6 g/dL (ref 6.5–8.1)

## 2020-10-23 LAB — CBC
HCT: 36.5 % — ABNORMAL LOW (ref 39.0–52.0)
Hemoglobin: 12.1 g/dL — ABNORMAL LOW (ref 13.0–17.0)
MCH: 30.6 pg (ref 26.0–34.0)
MCHC: 33.2 g/dL (ref 30.0–36.0)
MCV: 92.4 fL (ref 80.0–100.0)
Platelets: 194 10*3/uL (ref 150–400)
RBC: 3.95 MIL/uL — ABNORMAL LOW (ref 4.22–5.81)
RDW: 13.3 % (ref 11.5–15.5)
WBC: 15 10*3/uL — ABNORMAL HIGH (ref 4.0–10.5)
nRBC: 0 % (ref 0.0–0.2)

## 2020-10-23 LAB — D-DIMER, QUANTITATIVE: D-Dimer, Quant: 1.22 ug/mL-FEU — ABNORMAL HIGH (ref 0.00–0.50)

## 2020-10-23 LAB — C-REACTIVE PROTEIN: CRP: 11.6 mg/dL — ABNORMAL HIGH (ref <1.0)

## 2020-10-23 MED ORDER — POTASSIUM CHLORIDE CRYS ER 20 MEQ PO TBCR
40.0000 meq | EXTENDED_RELEASE_TABLET | Freq: Once | ORAL | Status: AC
  Administered 2020-10-23: 40 meq via ORAL
  Filled 2020-10-23: qty 2

## 2020-10-23 NOTE — Progress Notes (Signed)
PROGRESS NOTE                                                                                                                                                                                                             Patient Demographics:    Matthew [name], is a 55 y.o. male, DOB - 1966/09/20, WIO:[id number]  Outpatient Primary MD for the patient is Patient, No Pcp Per   Admit date - 09/26/2020   LOS - 27  No chief complaint on file.      Brief Narrative: Patient is a 55 y.o. male with PMHx of  CAD s/p PCI, tobacco use, HLD, GERD-who presented with shortness of breath-found to have acute hypoxic respiratory failure requiring 100% O2 via NRB due to COVID-19 pneumonia.  COVID-19 vaccinated status: Unvaccinated  Significant Events: 12/9>> started having GI symptoms including diarrhea 12/13>> to urgent care-for diarrhea-discharged after supportive care/IV fluids 12/16>> Admit to [facility] for hypoxia due to COVID-19 pneumonia-requiring 100% O2 via NRB 12/28>> hemoptysis-PCCM consulted-supportive care recommended. 12/31>> worsening hypoxemia-started on heated high flow-unable to lie flat for a CTA chest  Significant studies: 12/17>> CTA chest: No PE, diffuse groundglass opacities 12/18>> Echo: EF 60-65% 12/26>> chest x-ray: Diffuse bilateral heterogeneous/interstitial airspace opacities 12/31>> chest x-ray: Stable bilateral lung opacities 12/31>> lower extremity Doppler: No DVT 1/5>> chest x-ray: Mild improvement in multifocal opacities 1/10>> chest x-ray: Diffuse bilateral airspace disease   COVID-19 medications: Steroids: 12/16>> Remdesivir: 12/16>> 12/20 Baricitinib: 12/17>>12/30  Antibiotics: None  Microbiology data: None  Procedures: None  Consults: PCCM  DVT prophylaxis: Lovenox at BID dosing    Subjective:   Lying comfortably in bed-still on heated high flow but FiO2 requirement slowly coming  down-on 60 L FiO2 at 30 L/min.   Assessment  & Plan :   Acute Hypoxic Resp Failure due to Covid 19 Viral pneumonia: Continues to have severe hypoxemia-remains on heated high flow but O2 requirements are slowly coming down-continues to have anxiety spells.  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  Continue tapering steroids-once we have him off heated high flow-and on salter high flow-suspect he can be transitioned to a non-COVID unit.    Fever: afebrile O2 requirements:  SpO2: 99 % O2 Flow Rate (L/min): 30 L/min FiO2 (%): 60 %   COVID-19 Labs: Recent  Labs    10/21/20 0147 10/22/20 0225 10/23/20 0034  DDIMER 1.20* 1.23* 1.22*  CRP 13.3* 12.9* 11.6*       Component Value Date/Time   BNP 53.8 10/11/2020 1024    No results for input(s): PROCALCITON in the last 168 hours.  Lab Results  Component Value Date   SARSCOV2NAA POSITIVE (A) 09/26/2020   [place] NEGATIVE 02/06/2020   [place] NEGATIVE 11/17/2019    Prone/Incentive Spirometry: encouraged  incentive spirometry use 3-4/hour.  Mildly elevated D-dimer: Secondary to COVID-19 related inflammation-hypoxia has not worsened-he remains stable-has been on prophylactic Lovenox since admission-CTA chest/lower extremity Dopplers have been negative.  On twice daily dosing of Lovenox.  Acute on chronic diastolic heart failure: No evidence of volume overload-supportive care-keep in negative balance-continue to dose IV Lasix intermittently.  Suspect I/os inaccurate-Weight down to 210 pounds (250 pounds on admission)!!  Hemoptysis: Isolated episode that occurred on 12/28-likely related to underlying parenchymal injury from COVID-19 pneumonia.  No further episodes since then.  Continue to monitor closely-if reoccurs-we will reconsult PCCM.  Transaminitis: Due to COVID-19-downtrending-follow periodically  CAD s/p PCI: No anginal symptoms-not on any antiplatelets at home.  Per  patient report-PCI was approximately 11 years back  GERD: Continue PPI  Anxiety: Continues to have significant amount of anxiety-remains on Lexapro/Klonopin-dosage of Lexapro has been gradually increased to 15 mg-on 1 mg of Klonopin 3 times daily as needed.  Constipation-continue MiraLAX  Obesity: Estimated body mass index is 29.43 kg/m as calculated from the following:   Height as of this encounter: 5\' 11"  (1.803 m).   Weight as of this encounter: 95.7 kg.    GI prophylaxis: PPI  ABG:    Component Value Date/Time   PHART 7.464 (H) 09/26/2020 2035   PCO2ART 31.7 (L) 09/26/2020 2035   PO2ART 90 09/26/2020 2035   HCO3 22.8 09/26/2020 2035   TCO2 24 09/26/2020 2035   ACIDBASEDEF 0.8 11/17/2019 1618   O2SAT 98.0 09/26/2020 2035    Vent Settings: N/A FiO2 (%):  [60 %] 60 %  Condition - Guarded  Family Communication  :  Sherlyn Lees Frei [id number] 1/12  Code Status :  Full Code  Diet :  Diet Order            Diet Heart Room service appropriate? No; Fluid consistency: Thin; Fluid restriction: 2000 mL Fluid  Diet effective now                  Disposition Plan  :   Status is: Inpatient  Remains inpatient appropriate because:Inpatient level of care appropriate due to severity of illness   Dispo: The patient is from: Home              Anticipated d/c is to: Home              Anticipated d/c date is: > 3 days              Patient currently is not medically stable to d/c.   Barriers to discharge: Hypoxia requiring O2 supplementation  Antimicorbials  :    Anti-infectives (From admission, onward)   Start     Dose/Rate Route Frequency Ordered Stop   09/27/20 1000  remdesivir 100 mg in sodium chloride 0.9 % 100 mL IVPB       "Followed by" Linked Group Details   100 mg 200 mL/hr over 30 Minutes Intravenous Daily 09/26/20 2208 09/30/20 1900   09/26/20 2215  remdesivir 200 mg in sodium chloride 0.9% 250 mL IVPB       "  Followed by" Linked Group Details    200 mg 580 mL/hr over 30 Minutes Intravenous Once 09/26/20 2208 09/27/20 0146      Inpatient Medications  Scheduled Meds: . benzonatate  200 mg Oral TID  . enoxaparin (LOVENOX) injection  50 mg Subcutaneous Q12H  . escitalopram  15 mg Oral Daily  . melatonin  3 mg Oral QHS  . multivitamin with minerals  1 tablet Oral Daily  . pantoprazole  40 mg Oral Daily  . polyethylene glycol  17 g Oral Daily  . predniSONE  20 mg Oral Q breakfast  . sodium chloride flush  10-40 mL Intracatheter Q12H  . sodium chloride flush  3 mL Intravenous Q12H   Continuous Infusions:  PRN Meds:.acetaminophen, albuterol, bisacodyl, clonazePAM, guaiFENesin-dextromethorphan, menthol-cetylpyridinium, ondansetron (ZOFRAN) IV, senna-docusate, sodium chloride, sodium chloride flush   Time Spent in minutes  25   See all Orders from today for further details   Oren Binet M.D on 10/23/2020 at 12:50 PM  To page go to [email or web address] - use universal password  [facility] -  Office  [phone]    Objective:   Vitals:   10/23/20 0819 10/23/20 0917 10/23/20 1016 10/23/20 1232  BP: 96/64  91/64 103/69  Pulse: 82 85 92 94  Resp: 19 (!) 24 19 19   Temp: 97.6 F (36.4 C)  98 F (36.7 C) 97.8 F (36.6 C)  TempSrc: Axillary  Axillary Axillary  SpO2: 95% (!) 85% 94% 99%  Weight:      Height:        Wt Readings from Last 3 Encounters:  10/22/20 95.7 kg  12/04/19 108.4 kg  11/20/19 107.9 kg     Intake/Output Summary (Last 24 hours) at 10/23/2020 1250 Last data filed at 10/22/2020 1916 Gross per 24 hour  Intake -  Output 400 ml  Net -400 ml     Physical Exam Gen Exam:Alert awake-not in any distress HEENT:atraumatic, normocephalic Chest: B/L clear to auscultation anteriorly CVS:S1S2 regular Abdomen:soft non tender, non distended Extremities:no edema Neurology: Non focal Skin: no rash   Data Review:    CBC Recent Labs  Lab 10/19/20 0215 10/20/20 0746 10/21/20 0147  10/22/20 0225 10/23/20 0034  WBC 15.4* 16.6* 14.1* 14.8* 15.0*  HGB 13.9 12.9* 12.7* 12.5* 12.1*  HCT 39.5 36.9* 36.2* 36.2* 36.5*  PLT 134* 153 165 181 194  MCV 90.0 90.4 91.0 91.2 92.4  MCH 31.7 31.6 31.9 31.5 30.6  MCHC 35.2 35.0 35.1 34.5 33.2  RDW 13.2 13.4 13.3 13.4 13.3    Chemistries  Recent Labs  Lab 10/19/20 0215 10/20/20 0746 10/21/20 0147 10/22/20 0225 10/23/20 0034  NA 135 135 133* 136 137  K 3.4* 3.6 3.7 3.7 3.4*  CL 98 100 98 100 100  CO2 24 24 25 25 25   GLUCOSE 168* 126* 115* 104* 121*  BUN 27* 27* 25* 23* 23*  CREATININE 0.99 0.90 0.83 0.92 0.90  CALCIUM 8.3* 8.5* 8.3* 8.5* 8.4*  AST 30 21 16 17 20   ALT 129* 96* 75* 63* 59*  ALKPHOS 85 86 89 98 91  BILITOT 1.2 1.0 0.8 1.2 1.0   ------------------------------------------------------------------------------------------------------------------ No results for input(s): CHOL, HDL, LDLCALC, TRIG, CHOLHDL, LDLDIRECT in the last 72 hours.  No results found for: HGBA1C ------------------------------------------------------------------------------------------------------------------ No results for input(s): TSH, T4TOTAL, T3FREE, THYROIDAB in the last 72 hours.  Invalid input(s): FREET3 ------------------------------------------------------------------------------------------------------------------ No results for input(s): VITAMINB12, FOLATE, FERRITIN, TIBC, IRON, RETICCTPCT in the last 72 hours.  Coagulation profile No results for input(s): INR,  PROTIME in the last 168 hours.  Recent Labs    10/22/20 0225 10/23/20 0034  DDIMER 1.23* 1.22*    Cardiac Enzymes No results for input(s): CKMB, TROPONINI, MYOGLOBIN in the last 168 hours.  Invalid input(s): CK ------------------------------------------------------------------------------------------------------------------    Component Value Date/Time   BNP 53.8 10/11/2020 1024    Micro Results No results found for this or any previous visit (from the  past 240 hour(s)).  Radiology Reports CT ANGIO CHEST PE W OR WO CONTRAST  Result Date: 09/27/2020 CLINICAL DATA:  55 year old male with history of COVID-19, suspected pulmonary embolism. EXAM: CT ANGIOGRAPHY CHEST WITH CONTRAST TECHNIQUE: Multidetector CT imaging of the chest was performed using the standard protocol during bolus administration of intravenous contrast. Multiplanar CT image reconstructions and MIPs were obtained to evaluate the vascular anatomy. CONTRAST:  100 mL Omnipaque 350, intravenous COMPARISON:  Chest CT from 11/17/2019 FINDINGS: Cardiovascular: Satisfactory opacification of the pulmonary arteries to the segmental level. No evidence of pulmonary embolism. Normal heart size. No pericardial effusion. The heart is normal in size. No pericardial effusion. Severe coronary atherosclerotic calcifications. Mediastinum/Nodes: Similar appearing prominent right paratracheal lymph node measuring up to 1.2 cm in short axis. Otherwise no enlarged mediastinal, hilar, or axillary lymph nodes. Thyroid gland, trachea, and esophagus demonstrate no significant findings. Lungs/Pleura: Interval development of near diffuse, peripherally predominant ground-glass pulmonary opacities with a mid lung and basal predominance. Similar appearing moderate upper lobe predominant paraseptal and centrilobular emphysema. No pleural effusion or pneumothorax. Upper Abdomen: The visualized upper abdomen is within normal limits. Musculoskeletal: No chest wall abnormality. No acute or significant osseous findings. Review of the MIP images confirms the above findings. IMPRESSION: Vascular: 1. No evidence of pulmonary embolism. 2. Severe coronary atherosclerotic calcifications. Non-Vascular: 1. Diffuse, mid lung and basilar, peripherally predominant ground-glass opacities - findings compatible with multifocal pneumonia associated with COVID-19. 2. Similar appearing moderate upper lobe predominant centrilobular and paraseptal  emphysema. Electronically Signed   By: Ruthann Cancer MD   On: 09/27/2020 14:31   DG Chest Port 1 View  Result Date: 10/21/2020 CLINICAL DATA:  Short of breath.  COVID positive EXAM: PORTABLE CHEST 1 VIEW COMPARISON:  None. FINDINGS: Low lung volumes. normal cardiac silhouette. There is bilateral diffuse fine airspace disease. No pleural fluid. No pneumothorax. IMPRESSION: Bilateral diffuse airspace disease consistent with viral pneumonia. Electronically Signed   By: Suzy Bouchard M.D.   On: 10/21/2020 08:17   DG Chest Port 1 View  Result Date: 10/06/2020 CLINICAL DATA:  COVID positive, pleural effusion, pneumothorax EXAM: PORTABLE CHEST 1 VIEW COMPARISON:  09/26/2020 FINDINGS: Slight interval increase in diffuse bilateral heterogeneous and interstitial airspace opacity, most conspicuous at the lung bases. No significant pleural effusion noted. No pneumothorax. The heart and mediastinum are unremarkable. IMPRESSION: 1. Slight interval increase in diffuse bilateral heterogeneous and interstitial airspace opacity, most conspicuous at the lung bases, consistent with worsened COVID airspace disease. 2.  No significant pleural effusion noted. No pneumothorax. Electronically Signed   By: Eddie Candle M.D.   On: 10/06/2020 09:37   DG CHEST PORT 1 VIEW  Result Date: 09/26/2020 CLINICAL DATA:  COVID-19 EXAM: PORTABLE CHEST 1 VIEW COMPARISON:  09/26/2020 FINDINGS: Slight worsening of bilateral basilar predominant opacities. No pleural effusion or pneumothorax. Normal cardiomediastinal contours. IMPRESSION: Slight worsening of bilateral basilar predominant opacities. Electronically Signed   By: Ulyses Jarred M.D.   On: 09/26/2020 23:26   DG Chest Portable 1 View  Result Date: 09/26/2020 CLINICAL DATA:  Cough and shortness of breath.  EXAM: PORTABLE CHEST 1 VIEW COMPARISON:  12/04/2019 FINDINGS: Cardiomegaly which is increased from prior exam. Pulmonary edema, moderate in degree. There are bilateral pleural  effusions. No confluent consolidation. No pneumothorax. No acute osseous abnormalities are seen. IMPRESSION: CHF with cardiomegaly, pulmonary edema and bilateral pleural effusions. Electronically Signed   By: Keith Rake M.D.   On: 09/26/2020 19:14   DG Chest Port 1V same Day  Result Date: 10/16/2020 CLINICAL DATA:  COVID positive, short of breath EXAM: PORTABLE CHEST 1 VIEW COMPARISON:  None. FINDINGS: Normal cardiac silhouette. Bilateral peripheral fine airspace disease appears slightly less dense than comparison exam. No pneumothorax. No focal consolidation. IMPRESSION: Mild improvement in bilateral multifocal viral pneumonia. Electronically Signed   By: Suzy Bouchard M.D.   On: 10/16/2020 13:32   DG Chest Port 1V same Day  Result Date: 10/13/2020 CLINICAL DATA:  55 year old male with respiratory failure. Recent bronchoscopy and hemoptysis. History of COVID-19. EXAM: PORTABLE CHEST 1 VIEW COMPARISON:  Portable chest 10/11/2020 and earlier. FINDINGS: Portable AP semi upright view at 0554 hours. Lower lung volumes. Stable cardiac size and mediastinal contours. Visualized tracheal air column is within normal limits. Diffuse reticulonodular pulmonary opacity with confluent density in the peripheral right lung and at the lung bases now worse on the right. No pneumothorax. No definite pleural effusion. Stable visualized osseous structures. IMPRESSION: Continued diffuse reticulonodular opacity with lower lung volumes and worsening ventilation since 10/11/2020 at both lung bases. Electronically Signed   By: Genevie Ann M.D.   On: 10/13/2020 07:57   DG Chest Port 1V same Day  Result Date: 10/11/2020 CLINICAL DATA:  Hypoxia.  Shortness of breath. EXAM: PORTABLE CHEST 1 VIEW COMPARISON:  October 06, 2020. FINDINGS: The heart size and mediastinal contours are within normal limits. No pneumothorax or pleural effusion is noted. Stable bilateral lung opacities are noted concerning for multifocal pneumonia. The  visualized skeletal structures are unremarkable. IMPRESSION: Stable bilateral lung opacities are noted concerning for multifocal pneumonia. Electronically Signed   By: Marijo Conception M.D.   On: 10/11/2020 10:06   VAS [place] LOWER EXTREMITY VENOUS (DVT)  Result Date: 10/12/2020  Lower Venous DVT Study Indications: Worsening hypoxia, Covid-19.  Comparison Study: No prior studies. Performing Technologist: Darlin Coco, RDMS  Examination Guidelines: A complete evaluation includes B-mode imaging, spectral Doppler, color Doppler, and power Doppler as needed of all accessible portions of each vessel. Bilateral testing is considered an integral part of a complete examination. Limited examinations for reoccurring indications may be performed as noted. The reflux portion of the exam is performed with the patient in reverse Trendelenburg.  +---------+---------------+---------+-----------+----------+--------------+ RIGHT    CompressibilityPhasicitySpontaneityPropertiesThrombus Aging +---------+---------------+---------+-----------+----------+--------------+ CFV      Full           Yes      Yes                                 +---------+---------------+---------+-----------+----------+--------------+ SFJ      Full                                                        +---------+---------------+---------+-----------+----------+--------------+ FV Prox  Full                                                        +---------+---------------+---------+-----------+----------+--------------+  FV Mid   Full                                                        +---------+---------------+---------+-----------+----------+--------------+ FV DistalFull                                                        +---------+---------------+---------+-----------+----------+--------------+ PFV      Full                                                         +---------+---------------+---------+-----------+----------+--------------+ POP      Full           Yes      Yes                                 +---------+---------------+---------+-----------+----------+--------------+ PTV      Full                                                        +---------+---------------+---------+-----------+----------+--------------+ PERO     Full                                                        +---------+---------------+---------+-----------+----------+--------------+   +---------+---------------+---------+-----------+----------+--------------+ LEFT     CompressibilityPhasicitySpontaneityPropertiesThrombus Aging +---------+---------------+---------+-----------+----------+--------------+ CFV      Full           Yes      Yes                                 +---------+---------------+---------+-----------+----------+--------------+ SFJ      Full                                                        +---------+---------------+---------+-----------+----------+--------------+ FV Prox  Full                                                        +---------+---------------+---------+-----------+----------+--------------+ FV Mid   Full                                                        +---------+---------------+---------+-----------+----------+--------------+  FV DistalFull                                                        +---------+---------------+---------+-----------+----------+--------------+ PFV      Full                                                        +---------+---------------+---------+-----------+----------+--------------+ POP      Full           Yes      Yes                                 +---------+---------------+---------+-----------+----------+--------------+ PTV      Full                                                         +---------+---------------+---------+-----------+----------+--------------+ PERO     Full                                                        +---------+---------------+---------+-----------+----------+--------------+     Summary: RIGHT: - There is no evidence of deep vein thrombosis in the lower extremity.  - No cystic structure found in the popliteal fossa.  LEFT: - There is no evidence of deep vein thrombosis in the lower extremity.  - No cystic structure found in the popliteal fossa.  *See table(s) above for measurements and observations. Electronically signed by Ruta Hinds MD on 10/12/2020 at 10:00:37 AM.    Final    ECHOCARDIOGRAM LIMITED  Result Date: 09/28/2020    ECHOCARDIOGRAM LIMITED REPORT   Patient Name:   Matthew [name] Date of Exam: 09/28/2020 Medical Rec #:  [id number]          Height:       71.0 in Accession #:    [id number]         Weight:       244.3 lb Date of Birth:  04/12/1966          BSA:          2.296 m Patient Age:    59 years           BP:           120/84 mmHg Patient Gender: M                  HR:           73 bpm. Exam Location:  Inpatient Procedure: Limited Echo, Limited Color Doppler and Cardiac Doppler Indications:    acute respiratory distress  History:        Patient has prior history of Echocardiogram examinations, most                 recent 03/30/2016. CAD;  Covid.  Sonographer:    Johny Chess Referring Phys: [id number] Candace Gallus MELVIN  Sonographer Comments: Image acquisition challenging due to respiratory motion. IMPRESSIONS  1. Left ventricular ejection fraction, by estimation, is 60 to 65%. The left ventricle has normal function. The left ventricle has no regional wall motion abnormalities. There is mild left ventricular hypertrophy.  2. Right ventricular systolic function is normal. The right ventricular size is normal.  3. The mitral valve is normal in structure. No evidence of mitral valve regurgitation. No evidence of mitral stenosis.  4. The  aortic valve is tricuspid.  5. The inferior vena cava is normal in size with greater than 50% respiratory variability, suggesting right atrial pressure of 3 mmHg. FINDINGS  Left Ventricle: Left ventricular ejection fraction, by estimation, is 60 to 65%. The left ventricle has normal function. The left ventricle has no regional wall motion abnormalities. The left ventricular internal cavity size was normal in size. There is  mild left ventricular hypertrophy. Right Ventricle: The right ventricular size is normal. No increase in right ventricular wall thickness. Right ventricular systolic function is normal. Pericardium: There is no evidence of pericardial effusion. Mitral Valve: The mitral valve is normal in structure. No evidence of mitral valve stenosis. Tricuspid Valve: The tricuspid valve is normal in structure. Tricuspid valve regurgitation is not demonstrated. No evidence of tricuspid stenosis. Aortic Valve: The aortic valve is tricuspid. Pulmonic Valve: The pulmonic valve was not well visualized. Pulmonic valve regurgitation is not visualized. No evidence of pulmonic stenosis. Aorta: The aortic root is normal in size and structure. Pulmonary Artery: Indeterminant PASP, inadequate TR jet. Venous: The inferior vena cava is normal in size with greater than 50% respiratory variability, suggesting right atrial pressure of 3 mmHg. IAS/Shunts: No atrial level shunt detected by color flow Doppler. LEFT VENTRICLE PLAX 2D LVIDd:         4.20 cm LVIDs:         2.80 cm LV PW:         1.30 cm LV IVS:        1.10 cm LVOT diam:     2.00 cm LV SV:         71 LV SV Index:   31 LVOT Area:     3.14 cm  IVC IVC diam: 1.70 cm LEFT ATRIUM         Index LA diam:    4.30 cm 1.87 cm/m  AORTIC VALVE LVOT Vmax:   116.00 cm/s LVOT Vmean:  76.100 cm/s LVOT VTI:    0.227 m  AORTA Ao Root diam: 3.10 cm MITRAL VALVE MV Area (PHT): 2.91 cm    SHUNTS MV Decel Time: 261 msec    Systemic VTI:  0.23 m MV E velocity: 75.40 cm/s  Systemic Diam:  2.00 cm MV A velocity: 66.00 cm/s MV E/A ratio:  1.14 Carlyle Dolly MD Electronically signed by Carlyle Dolly MD Signature Date/Time: 09/28/2020/2:52:33 PM    Final

## 2020-10-24 LAB — CBC
HCT: 35.5 % — ABNORMAL LOW (ref 39.0–52.0)
Hemoglobin: 12.1 g/dL — ABNORMAL LOW (ref 13.0–17.0)
MCH: 31.2 pg (ref 26.0–34.0)
MCHC: 34.1 g/dL (ref 30.0–36.0)
MCV: 91.5 fL (ref 80.0–100.0)
Platelets: 197 10*3/uL (ref 150–400)
RBC: 3.88 MIL/uL — ABNORMAL LOW (ref 4.22–5.81)
RDW: 13.6 % (ref 11.5–15.5)
WBC: 14 10*3/uL — ABNORMAL HIGH (ref 4.0–10.5)
nRBC: 0 % (ref 0.0–0.2)

## 2020-10-24 LAB — COMPREHENSIVE METABOLIC PANEL
ALT: 62 U/L — ABNORMAL HIGH (ref 0–44)
AST: 25 U/L (ref 15–41)
Albumin: 2.4 g/dL — ABNORMAL LOW (ref 3.5–5.0)
Alkaline Phosphatase: 101 U/L (ref 38–126)
Anion gap: 11 (ref 5–15)
BUN: 19 mg/dL (ref 6–20)
CO2: 26 mmol/L (ref 22–32)
Calcium: 8.5 mg/dL — ABNORMAL LOW (ref 8.9–10.3)
Chloride: 99 mmol/L (ref 98–111)
Creatinine, Ser: 0.95 mg/dL (ref 0.61–1.24)
GFR, Estimated: >60 mL/min (ref >60)
Glucose, Bld: 135 mg/dL — ABNORMAL HIGH (ref 70–99)
Potassium: 3.7 mmol/L (ref 3.5–5.1)
Sodium: 136 mmol/L (ref 135–145)
Total Bilirubin: 0.9 mg/dL (ref 0.3–1.2)
Total Protein: 6.8 g/dL (ref 6.5–8.1)

## 2020-10-24 LAB — C-REACTIVE PROTEIN: CRP: 10.1 mg/dL — ABNORMAL HIGH (ref <1.0)

## 2020-10-24 MED ORDER — TAB-A-VITE/IRON PO TABS
1.0000 | ORAL_TABLET | Freq: Every day | ORAL | Status: DC
  Administered 2020-10-24 – 2020-11-16 (×20): 1 via ORAL
  Filled 2020-10-24 (×24): qty 1

## 2020-10-24 MED ORDER — ESCITALOPRAM OXALATE 10 MG PO TABS
20.0000 mg | ORAL_TABLET | Freq: Every day | ORAL | Status: DC
  Administered 2020-10-25 – 2020-11-16 (×23): 20 mg via ORAL
  Filled 2020-10-24 (×3): qty 2
  Filled 2020-10-24: qty 1
  Filled 2020-10-24 (×4): qty 2
  Filled 2020-10-24 (×2): qty 1
  Filled 2020-10-24: qty 2
  Filled 2020-10-24 (×2): qty 1
  Filled 2020-10-24 (×2): qty 2
  Filled 2020-10-24: qty 1
  Filled 2020-10-24 (×5): qty 2
  Filled 2020-10-24 (×2): qty 1

## 2020-10-24 NOTE — Progress Notes (Addendum)
PROGRESS NOTE                                                                                                                                                                                                             Patient Demographics:    Matthew [name], is a 55 y.o. male, DOB - 03/24/1966, HBZ:[id number]  Outpatient Primary MD for the patient is Patient, No Pcp Per   Admit date - 09/26/2020   LOS - 28  No chief complaint on file.      Brief Narrative: Patient is a 55 y.o. male with PMHx of  CAD s/p PCI, tobacco use, HLD, GERD-who presented with shortness of breath-found to have acute hypoxic respiratory failure requiring 100% O2 via NRB due to COVID-19 pneumonia.  COVID-19 vaccinated status: Unvaccinated  Significant Events: 12/9>> started having GI symptoms including diarrhea 12/13>> to urgent care-for diarrhea-discharged after supportive care/IV fluids 12/16>> Admit to [facility] for hypoxia due to COVID-19 pneumonia-requiring 100% O2 via NRB 12/28>> hemoptysis-PCCM consulted-supportive care recommended. 12/31>> worsening hypoxemia-started on heated high flow-unable to lie flat for a CTA chest  Significant studies: 12/17>> CTA chest: No PE, diffuse groundglass opacities 12/18>> Echo: EF 60-65% 12/26>> chest x-ray: Diffuse bilateral heterogeneous/interstitial airspace opacities 12/31>> chest x-ray: Stable bilateral lung opacities 12/31>> lower extremity Doppler: No DVT 1/5>> chest x-ray: Mild improvement in multifocal opacities 1/10>> chest x-ray: Diffuse bilateral airspace disease   COVID-19 medications: Steroids: 12/16>> Remdesivir: 12/16>> 12/20 Baricitinib: 12/17>>12/30  Antibiotics: None  Microbiology data: None  Procedures: None  Consults: PCCM  DVT prophylaxis: Lovenox at BID dosing    Subjective:   Appears essentially unchanged compared to yesterday.  Anxiety continues to be a major  issue   Assessment  & Plan :   Acute Hypoxic Resp Failure due to Covid 19 Viral pneumonia: Continues to have severe hypoxemia-remains on heated high flow but O2 requirements are slowly coming down-continues to have anxiety spells.  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  Continue tapering steroids-once we have him off heated high flow-and on salter high flow-suspect he can be transitioned to a non-COVID unit.    Fever: afebrile O2 requirements:  SpO2: 95 % O2 Flow Rate (L/min): 30 L/min FiO2 (%): 50 %   COVID-19 Labs: Recent Labs    10/22/20 0225  10/23/20 0034 10/24/20 0121  DDIMER 1.23* 1.22*  --   CRP 12.9* 11.6* 10.1*       Component Value Date/Time   BNP 53.8 10/11/2020 1024    No results for input(s): PROCALCITON in the last 168 hours.  Lab Results  Component Value Date   SARSCOV2NAA POSITIVE (A) 09/26/2020   [place] NEGATIVE 02/06/2020   [place] NEGATIVE 11/17/2019    Prone/Incentive Spirometry: encouraged  incentive spirometry use 3-4/hour.  Mildly elevated D-dimer: Secondary to COVID-19 related inflammation-hypoxia has not worsened-he remains stable-has been on prophylactic Lovenox since admission-CTA chest/lower extremity Dopplers have been negative.  On twice daily dosing of Lovenox.  Acute on chronic diastolic heart failure: No evidence of volume overload-supportive care-keep in negative balance-continue to dose IV Lasix intermittently.  Suspect I/os inaccurate-Weight down to 210 pounds (250 pounds on admission)!!  Hemoptysis: Isolated episode that occurred on 12/28-likely related to underlying parenchymal injury from COVID-19 pneumonia.  No further episodes since then.  Continue to monitor closely-if reoccurs-we will reconsult PCCM.  Transaminitis: Due to COVID-19-downtrending-follow periodically  CAD s/p PCI: No anginal symptoms-not on any antiplatelets at home.  Per patient report-PCI was  approximately 11 years back  GERD: Continue PPI  Anxiety: Continues to have significant amount of anxiety-remains on Lexapro/Klonopin-dosage of Lexapro has been gradually increased to 20 mg daily-on 1 mg of Klonopin 3 times daily as needed.  Constipation-continue MiraLAX  Obesity: Estimated body mass index is 28.87 kg/m as calculated from the following:   Height as of this encounter: 5\' 11"  (1.803 m).   Weight as of this encounter: 93.9 kg.    GI prophylaxis: PPI  ABG:    Component Value Date/Time   PHART 7.464 (H) 09/26/2020 2035   PCO2ART 31.7 (L) 09/26/2020 2035   PO2ART 90 09/26/2020 2035   HCO3 22.8 09/26/2020 2035   TCO2 24 09/26/2020 2035   ACIDBASEDEF 0.8 11/17/2019 1618   O2SAT 98.0 09/26/2020 2035    Vent Settings: N/A FiO2 (%):  [50 %-60 %] 50 %  Condition - Guarded  Family Communication  :  Sherlyn Lees Dibari [id number] 1/13  Code Status :  Full Code  Diet :  Diet Order            Diet Heart Room service appropriate? No; Fluid consistency: Thin; Fluid restriction: 2000 mL Fluid  Diet effective now                  Disposition Plan  :   Status is: Inpatient  Remains inpatient appropriate because:Inpatient level of care appropriate due to severity of illness   Dispo: The patient is from: Home              Anticipated d/c is to: Home              Anticipated d/c date is: > 3 days              Patient currently is not medically stable to d/c.   Barriers to discharge: Hypoxia requiring O2 supplementation  Antimicorbials  :    Anti-infectives (From admission, onward)   Start     Dose/Rate Route Frequency Ordered Stop   09/27/20 1000  remdesivir 100 mg in sodium chloride 0.9 % 100 mL IVPB       "Followed by" Linked Group Details   100 mg 200 mL/hr over 30 Minutes Intravenous Daily 09/26/20 2208 09/30/20 1900   09/26/20 2215  remdesivir 200 mg in sodium chloride 0.9% 250 mL IVPB       "  Followed by" Linked Group Details   200  mg 580 mL/hr over 30 Minutes Intravenous Once 09/26/20 2208 09/27/20 0146      Inpatient Medications  Scheduled Meds: . benzonatate  200 mg Oral TID  . enoxaparin (LOVENOX) injection  50 mg Subcutaneous Q12H  . [START ON 10/25/2020] escitalopram  20 mg Oral Daily  . melatonin  3 mg Oral QHS  . pantoprazole  40 mg Oral Daily  . polyethylene glycol  17 g Oral Daily  . predniSONE  20 mg Oral Q breakfast  . sodium chloride flush  10-40 mL Intracatheter Q12H  . sodium chloride flush  3 mL Intravenous Q12H   Continuous Infusions:  PRN Meds:.acetaminophen, albuterol, bisacodyl, clonazePAM, guaiFENesin-dextromethorphan, menthol-cetylpyridinium, ondansetron (ZOFRAN) IV, senna-docusate, sodium chloride, sodium chloride flush   Time Spent in minutes  15   See all Orders from today for further details   Oren Binet M.D on 10/24/2020 at 1:14 PM  To page go to [email or web address] - use universal password  [facility] -  Office  [phone]    Objective:   Vitals:   10/24/20 0900 10/24/20 0933 10/24/20 1031 10/24/20 1204  BP:    91/68  Pulse: 100   95  Resp:    20  Temp:    97.7 F (36.5 C)  TempSrc:    Axillary  SpO2: (!) 88% (!) 89% 95% 95%  Weight:      Height:        Wt Readings from Last 3 Encounters:  10/24/20 93.9 kg  12/04/19 108.4 kg  11/20/19 107.9 kg     Intake/Output Summary (Last 24 hours) at 10/24/2020 1314 Last data filed at 10/24/2020 0500 Gross per 24 hour  Intake 480 ml  Output 700 ml  Net -220 ml     Physical Exam Gen Exam:Alert awake-not in any distress HEENT:atraumatic, normocephalic Chest: B/L clear to auscultation anteriorly CVS:S1S2 regular Abdomen:soft non tender, non distended Extremities:no edema Neurology: Non focal Skin: no rash   Data Review:    CBC Recent Labs  Lab 10/20/20 0746 10/21/20 0147 10/22/20 0225 10/23/20 0034 10/24/20 0121  WBC 16.6* 14.1* 14.8* 15.0* 14.0*  HGB 12.9* 12.7* 12.5* 12.1* 12.1*  HCT  36.9* 36.2* 36.2* 36.5* 35.5*  PLT 153 165 181 194 197  MCV 90.4 91.0 91.2 92.4 91.5  MCH 31.6 31.9 31.5 30.6 31.2  MCHC 35.0 35.1 34.5 33.2 34.1  RDW 13.4 13.3 13.4 13.3 13.6    Chemistries  Recent Labs  Lab 10/20/20 0746 10/21/20 0147 10/22/20 0225 10/23/20 0034 10/24/20 0121  NA 135 133* 136 137 136  K 3.6 3.7 3.7 3.4* 3.7  CL 100 98 100 100 99  CO2 24 25 25 25 26   GLUCOSE 126* 115* 104* 121* 135*  BUN 27* 25* 23* 23* 19  CREATININE 0.90 0.83 0.92 0.90 0.95  CALCIUM 8.5* 8.3* 8.5* 8.4* 8.5*  AST 21 16 17 20 25   ALT 96* 75* 63* 59* 62*  ALKPHOS 86 89 98 91 101  BILITOT 1.0 0.8 1.2 1.0 0.9   ------------------------------------------------------------------------------------------------------------------ No results for input(s): CHOL, HDL, LDLCALC, TRIG, CHOLHDL, LDLDIRECT in the last 72 hours.  No results found for: HGBA1C ------------------------------------------------------------------------------------------------------------------ No results for input(s): TSH, T4TOTAL, T3FREE, THYROIDAB in the last 72 hours.  Invalid input(s): FREET3 ------------------------------------------------------------------------------------------------------------------ No results for input(s): VITAMINB12, FOLATE, FERRITIN, TIBC, IRON, RETICCTPCT in the last 72 hours.  Coagulation profile No results for input(s): INR, PROTIME in the last 168 hours.  Recent Labs  10/22/20 0225 10/23/20 0034  DDIMER 1.23* 1.22*    Cardiac Enzymes No results for input(s): CKMB, TROPONINI, MYOGLOBIN in the last 168 hours.  Invalid input(s): CK ------------------------------------------------------------------------------------------------------------------    Component Value Date/Time   BNP 53.8 10/11/2020 1024    Micro Results No results found for this or any previous visit (from the past 240 hour(s)).  Radiology Reports CT ANGIO CHEST PE W OR WO CONTRAST  Result Date:  09/27/2020 CLINICAL DATA:  55 year old male with history of COVID-19, suspected pulmonary embolism. EXAM: CT ANGIOGRAPHY CHEST WITH CONTRAST TECHNIQUE: Multidetector CT imaging of the chest was performed using the standard protocol during bolus administration of intravenous contrast. Multiplanar CT image reconstructions and MIPs were obtained to evaluate the vascular anatomy. CONTRAST:  100 mL Omnipaque 350, intravenous COMPARISON:  Chest CT from 11/17/2019 FINDINGS: Cardiovascular: Satisfactory opacification of the pulmonary arteries to the segmental level. No evidence of pulmonary embolism. Normal heart size. No pericardial effusion. The heart is normal in size. No pericardial effusion. Severe coronary atherosclerotic calcifications. Mediastinum/Nodes: Similar appearing prominent right paratracheal lymph node measuring up to 1.2 cm in short axis. Otherwise no enlarged mediastinal, hilar, or axillary lymph nodes. Thyroid gland, trachea, and esophagus demonstrate no significant findings. Lungs/Pleura: Interval development of near diffuse, peripherally predominant ground-glass pulmonary opacities with a mid lung and basal predominance. Similar appearing moderate upper lobe predominant paraseptal and centrilobular emphysema. No pleural effusion or pneumothorax. Upper Abdomen: The visualized upper abdomen is within normal limits. Musculoskeletal: No chest wall abnormality. No acute or significant osseous findings. Review of the MIP images confirms the above findings. IMPRESSION: Vascular: 1. No evidence of pulmonary embolism. 2. Severe coronary atherosclerotic calcifications. Non-Vascular: 1. Diffuse, mid lung and basilar, peripherally predominant ground-glass opacities - findings compatible with multifocal pneumonia associated with COVID-19. 2. Similar appearing moderate upper lobe predominant centrilobular and paraseptal emphysema. Electronically Signed   By: Ruthann Cancer MD   On: 09/27/2020 14:31   DG Chest Port  1 View  Result Date: 10/21/2020 CLINICAL DATA:  Short of breath.  COVID positive EXAM: PORTABLE CHEST 1 VIEW COMPARISON:  None. FINDINGS: Low lung volumes. normal cardiac silhouette. There is bilateral diffuse fine airspace disease. No pleural fluid. No pneumothorax. IMPRESSION: Bilateral diffuse airspace disease consistent with viral pneumonia. Electronically Signed   By: Suzy Bouchard M.D.   On: 10/21/2020 08:17   DG Chest Port 1 View  Result Date: 10/06/2020 CLINICAL DATA:  COVID positive, pleural effusion, pneumothorax EXAM: PORTABLE CHEST 1 VIEW COMPARISON:  09/26/2020 FINDINGS: Slight interval increase in diffuse bilateral heterogeneous and interstitial airspace opacity, most conspicuous at the lung bases. No significant pleural effusion noted. No pneumothorax. The heart and mediastinum are unremarkable. IMPRESSION: 1. Slight interval increase in diffuse bilateral heterogeneous and interstitial airspace opacity, most conspicuous at the lung bases, consistent with worsened COVID airspace disease. 2.  No significant pleural effusion noted. No pneumothorax. Electronically Signed   By: Eddie Candle M.D.   On: 10/06/2020 09:37   DG CHEST PORT 1 VIEW  Result Date: 09/26/2020 CLINICAL DATA:  COVID-19 EXAM: PORTABLE CHEST 1 VIEW COMPARISON:  09/26/2020 FINDINGS: Slight worsening of bilateral basilar predominant opacities. No pleural effusion or pneumothorax. Normal cardiomediastinal contours. IMPRESSION: Slight worsening of bilateral basilar predominant opacities. Electronically Signed   By: Ulyses Jarred M.D.   On: 09/26/2020 23:26   DG Chest Portable 1 View  Result Date: 09/26/2020 CLINICAL DATA:  Cough and shortness of breath. EXAM: PORTABLE CHEST 1 VIEW COMPARISON:  12/04/2019 FINDINGS: Cardiomegaly which is  increased from prior exam. Pulmonary edema, moderate in degree. There are bilateral pleural effusions. No confluent consolidation. No pneumothorax. No acute osseous abnormalities are seen.  IMPRESSION: CHF with cardiomegaly, pulmonary edema and bilateral pleural effusions. Electronically Signed   By: Keith Rake M.D.   On: 09/26/2020 19:14   DG Chest Port 1V same Day  Result Date: 10/16/2020 CLINICAL DATA:  COVID positive, short of breath EXAM: PORTABLE CHEST 1 VIEW COMPARISON:  None. FINDINGS: Normal cardiac silhouette. Bilateral peripheral fine airspace disease appears slightly less dense than comparison exam. No pneumothorax. No focal consolidation. IMPRESSION: Mild improvement in bilateral multifocal viral pneumonia. Electronically Signed   By: Suzy Bouchard M.D.   On: 10/16/2020 13:32   DG Chest Port 1V same Day  Result Date: 10/13/2020 CLINICAL DATA:  55 year old male with respiratory failure. Recent bronchoscopy and hemoptysis. History of COVID-19. EXAM: PORTABLE CHEST 1 VIEW COMPARISON:  Portable chest 10/11/2020 and earlier. FINDINGS: Portable AP semi upright view at 0554 hours. Lower lung volumes. Stable cardiac size and mediastinal contours. Visualized tracheal air column is within normal limits. Diffuse reticulonodular pulmonary opacity with confluent density in the peripheral right lung and at the lung bases now worse on the right. No pneumothorax. No definite pleural effusion. Stable visualized osseous structures. IMPRESSION: Continued diffuse reticulonodular opacity with lower lung volumes and worsening ventilation since 10/11/2020 at both lung bases. Electronically Signed   By: Genevie Ann M.D.   On: 10/13/2020 07:57   DG Chest Port 1V same Day  Result Date: 10/11/2020 CLINICAL DATA:  Hypoxia.  Shortness of breath. EXAM: PORTABLE CHEST 1 VIEW COMPARISON:  October 06, 2020. FINDINGS: The heart size and mediastinal contours are within normal limits. No pneumothorax or pleural effusion is noted. Stable bilateral lung opacities are noted concerning for multifocal pneumonia. The visualized skeletal structures are unremarkable. IMPRESSION: Stable bilateral lung opacities are  noted concerning for multifocal pneumonia. Electronically Signed   By: Marijo Conception M.D.   On: 10/11/2020 10:06   VAS [place] LOWER EXTREMITY VENOUS (DVT)  Result Date: 10/12/2020  Lower Venous DVT Study Indications: Worsening hypoxia, Covid-19.  Comparison Study: No prior studies. Performing Technologist: Darlin Coco, RDMS  Examination Guidelines: A complete evaluation includes B-mode imaging, spectral Doppler, color Doppler, and power Doppler as needed of all accessible portions of each vessel. Bilateral testing is considered an integral part of a complete examination. Limited examinations for reoccurring indications may be performed as noted. The reflux portion of the exam is performed with the patient in reverse Trendelenburg.  +---------+---------------+---------+-----------+----------+--------------+ RIGHT    CompressibilityPhasicitySpontaneityPropertiesThrombus Aging +---------+---------------+---------+-----------+----------+--------------+ CFV      Full           Yes      Yes                                 +---------+---------------+---------+-----------+----------+--------------+ SFJ      Full                                                        +---------+---------------+---------+-----------+----------+--------------+ FV Prox  Full                                                        +---------+---------------+---------+-----------+----------+--------------+  FV Mid   Full                                                        +---------+---------------+---------+-----------+----------+--------------+ FV DistalFull                                                        +---------+---------------+---------+-----------+----------+--------------+ PFV      Full                                                        +---------+---------------+---------+-----------+----------+--------------+ POP      Full           Yes      Yes                                  +---------+---------------+---------+-----------+----------+--------------+ PTV      Full                                                        +---------+---------------+---------+-----------+----------+--------------+ PERO     Full                                                        +---------+---------------+---------+-----------+----------+--------------+   +---------+---------------+---------+-----------+----------+--------------+ LEFT     CompressibilityPhasicitySpontaneityPropertiesThrombus Aging +---------+---------------+---------+-----------+----------+--------------+ CFV      Full           Yes      Yes                                 +---------+---------------+---------+-----------+----------+--------------+ SFJ      Full                                                        +---------+---------------+---------+-----------+----------+--------------+ FV Prox  Full                                                        +---------+---------------+---------+-----------+----------+--------------+ FV Mid   Full                                                        +---------+---------------+---------+-----------+----------+--------------+  FV DistalFull                                                        +---------+---------------+---------+-----------+----------+--------------+ PFV      Full                                                        +---------+---------------+---------+-----------+----------+--------------+ POP      Full           Yes      Yes                                 +---------+---------------+---------+-----------+----------+--------------+ PTV      Full                                                        +---------+---------------+---------+-----------+----------+--------------+ PERO     Full                                                         +---------+---------------+---------+-----------+----------+--------------+     Summary: RIGHT: - There is no evidence of deep vein thrombosis in the lower extremity.  - No cystic structure found in the popliteal fossa.  LEFT: - There is no evidence of deep vein thrombosis in the lower extremity.  - No cystic structure found in the popliteal fossa.  *See table(s) above for measurements and observations. Electronically signed by Ruta Hinds MD on 10/12/2020 at 10:00:37 AM.    Final    ECHOCARDIOGRAM LIMITED  Result Date: 09/28/2020    ECHOCARDIOGRAM LIMITED REPORT   Patient Name:   Matthew [name] Date of Exam: 09/28/2020 Medical Rec #:  [id number]          Height:       71.0 in Accession #:    [id number]         Weight:       244.3 lb Date of Birth:  1966/06/18          BSA:          2.296 m Patient Age:    70 years           BP:           120/84 mmHg Patient Gender: M                  HR:           73 bpm. Exam Location:  Inpatient Procedure: Limited Echo, Limited Color Doppler and Cardiac Doppler Indications:    acute respiratory distress  History:        Patient has prior history of Echocardiogram examinations, most                 recent 03/30/2016. CAD;  Covid.  Sonographer:    Johny Chess Referring Phys: [id number] Candace Gallus MELVIN  Sonographer Comments: Image acquisition challenging due to respiratory motion. IMPRESSIONS  1. Left ventricular ejection fraction, by estimation, is 60 to 65%. The left ventricle has normal function. The left ventricle has no regional wall motion abnormalities. There is mild left ventricular hypertrophy.  2. Right ventricular systolic function is normal. The right ventricular size is normal.  3. The mitral valve is normal in structure. No evidence of mitral valve regurgitation. No evidence of mitral stenosis.  4. The aortic valve is tricuspid.  5. The inferior vena cava is normal in size with greater than 50% respiratory variability, suggesting right atrial pressure of 3  mmHg. FINDINGS  Left Ventricle: Left ventricular ejection fraction, by estimation, is 60 to 65%. The left ventricle has normal function. The left ventricle has no regional wall motion abnormalities. The left ventricular internal cavity size was normal in size. There is  mild left ventricular hypertrophy. Right Ventricle: The right ventricular size is normal. No increase in right ventricular wall thickness. Right ventricular systolic function is normal. Pericardium: There is no evidence of pericardial effusion. Mitral Valve: The mitral valve is normal in structure. No evidence of mitral valve stenosis. Tricuspid Valve: The tricuspid valve is normal in structure. Tricuspid valve regurgitation is not demonstrated. No evidence of tricuspid stenosis. Aortic Valve: The aortic valve is tricuspid. Pulmonic Valve: The pulmonic valve was not well visualized. Pulmonic valve regurgitation is not visualized. No evidence of pulmonic stenosis. Aorta: The aortic root is normal in size and structure. Pulmonary Artery: Indeterminant PASP, inadequate TR jet. Venous: The inferior vena cava is normal in size with greater than 50% respiratory variability, suggesting right atrial pressure of 3 mmHg. IAS/Shunts: No atrial level shunt detected by color flow Doppler. LEFT VENTRICLE PLAX 2D LVIDd:         4.20 cm LVIDs:         2.80 cm LV PW:         1.30 cm LV IVS:        1.10 cm LVOT diam:     2.00 cm LV SV:         71 LV SV Index:   31 LVOT Area:     3.14 cm  IVC IVC diam: 1.70 cm LEFT ATRIUM         Index LA diam:    4.30 cm 1.87 cm/m  AORTIC VALVE LVOT Vmax:   116.00 cm/s LVOT Vmean:  76.100 cm/s LVOT VTI:    0.227 m  AORTA Ao Root diam: 3.10 cm MITRAL VALVE MV Area (PHT): 2.91 cm    SHUNTS MV Decel Time: 261 msec    Systemic VTI:  0.23 m MV E velocity: 75.40 cm/s  Systemic Diam: 2.00 cm MV A velocity: 66.00 cm/s MV E/A ratio:  1.14 Carlyle Dolly MD Electronically signed by Carlyle Dolly MD Signature Date/Time: 09/28/2020/2:52:33  PM    Final

## 2020-10-24 NOTE — Plan of Care (Signed)

## 2020-10-24 NOTE — TOC Progression Note (Addendum)
Transition of Care [facility]) - Progression Note    Patient Details  Name: ODDIS WESTLING MRN: [id number] Date of Birth: Jul 20, 1966  [facility] CM/SW [place], RN Phone Number: 10/24/2020, 9:58 AM  Clinical Narrative:    Patient continues on HF oxygen,  PT attempting to increase activity with PT and OT support. Emailed FC to start looking at [facility].    Expected Discharge Plan: Home/Self Care Barriers to Discharge: Continued Medical Work up  Expected Discharge Plan and Services Expected Discharge Plan: Home/Self Care   Discharge Planning Services: CM Consult Post Acute Care Choice: Durable Medical Equipment Living arrangements for the past 2 months: Single Family Home                                       Social Determinants of Health (SDOH) Interventions    Readmission Risk Interventions No flowsheet data found.

## 2020-10-24 NOTE — Progress Notes (Signed)
Pt's wife Beverlee Nims) took belongings home with her (keys, clothing, shoes - she states she has his wallet with her).

## 2020-10-24 NOTE — Progress Notes (Addendum)
Physical Therapy Treatment Patient Details Name: Matthew [name] MRN: [id number] DOB: 05/13/1966 Today's Date: 10/24/2020    History of Present Illness Pt is a 55 y.o. male admitted 09/26/20 with SOB and GI distress (had declined COVID test when seen in urgent care). Pt now testing (+) COVID-19. Workup for acute hypoxic respiratory failure due to acute COVID-19 viral PNA. Course complicated by hemoptysis, worsening hypoxemis requiring HHFNC; pt unable to lay flat for chest CTA. Negative DVT on 12/31. Repeat CXR 1/10 with diffuse bilateral airspace disease. PMH includes CAD, CHF, tobacco use (quit 11/2019); pt unvaccinated.   PT Comments    Pt remains limited by significant anxiety and DOE with activity. SpO2 down to 81% on 30L O2 HHFNC + 15L NRB with short bout of sitting and standing activity; pt requires prolonged seated/sidelying rest to recover from "panic attacks" with cues for deep breathing; SpO2 >/94% at end of session (RN aware). Will continue to follow acutely and progress activity as tolerated.    Follow Up Recommendations  Home health PT;Supervision for mobility/OOB (pending progress)     Equipment Recommendations   (TBD)    Recommendations for Other Services       Precautions / Restrictions Precautions Precautions: Fall;Other (comment) Precaution Comments: significant anxiety; watch SpO2 on 30-40L O2 HHFNC at 60% FiO2 + 15L NRB Restrictions Weight Bearing Restrictions: No    Mobility  Bed Mobility Overal bed mobility: Modified Independent             General bed mobility comments: 3x total sidelying<>sit EOB as pt becoming SOB once sitting then stating, "Here comes an anxiety attack" and returning to supine, this happened 2x before progressing to standing  Transfers Overall transfer level: Needs assistance Equipment used: Rolling walker (2 wheeled) Transfers: Sit to/from Stand Sit to Stand: Min guard         General transfer comment: Pt tolerated 1x  sit<>stand from EOB to RW, close min guard for balance; poor eccentric control into sitting with immediate return to supine due to SOB/anxiety post-ambulation  Ambulation/Gait Ambulation/Gait assistance: Min guard Gait Distance (Feet): 8 Feet Assistive device: Rolling walker (2 wheeled) Gait Pattern/deviations: Step-to pattern;Trunk flexed;Leaning posteriorly Gait velocity: Decreased   General Gait Details: Slow, labored gait with RW and close min guard for balance; pt ambulating 4' forwards and 4' backwards, with return to sit and immediate return to sidelying with SOB/anxiety. SpO2 down to 81% on 30L O2 HFNC + 15L NRB. Pt declining sitting in recliner or additional mobility   Stairs             Wheelchair Mobility    Modified Rankin (Stroke Patients Only)       Balance Overall balance assessment: Needs assistance Sitting-balance support: Feet supported;Feet unsupported Sitting balance-Leahy Scale: Good     Standing balance support: Bilateral upper extremity supported Standing balance-Leahy Scale: Poor Standing balance comment: Reliant on UE support                            Cognition Arousal/Alertness: Awake/alert Behavior During Therapy: Anxious Overall Cognitive Status: Within Functional Limits for tasks assessed                                 General Comments: Responds well to music as distraction, but still with significant anxiety      Exercises      General Comments General comments (  skin integrity, edema, etc.): Still limited by significant anxiety, requiring prolonged seated/laying rest to recover SOB/anxiety. SpO2 down to 81% on 30L O2 HHFNC + 15L NRB, pt stating "give me more oxygen, pump it up, i need more" - recovering to 94% on 40L O2 HHFNC + 15L NRB; returned to 30L [place] at end of session and maintaining >/94% (RN aware). Pt apologetic regarding "lack of progress" this session; responds well to encouragement education on  current functional status, O2 needs, anxiety. Does not respond well to provided relaxation techniques      Pertinent Vitals/Pain Pain Assessment: No/denies pain    Home Living                      Prior Function            PT Goals (current goals can now be found in the care plan section) Progress towards PT goals: Progressing toward goals (slowly)    Frequency    Min 3X/week      PT Plan Current plan remains appropriate    Co-evaluation              AM-PAC PT "6 Clicks" Mobility   Outcome Measure  Help needed turning from your back to your side while in a flat bed without using bedrails?: None Help needed moving from lying on your back to sitting on the side of a flat bed without using bedrails?: None Help needed moving to and from a bed to a chair (including a wheelchair)?: A Little Help needed standing up from a chair using your arms (e.g., wheelchair or bedside chair)?: A Little Help needed to walk in hospital room?: A Little Help needed climbing 3-5 steps with a railing? : A Lot 6 Click Score: 19    End of Session Equipment Utilized During Treatment: Oxygen Activity Tolerance: Treatment limited secondary to medical complications (Comment);Other (comment) (limited by hypoxia/DOE/anxiety) Patient left: in bed;with call bell/phone within reach Nurse Communication: Mobility status PT Visit Diagnosis: Other abnormalities of gait and mobility (R26.89)     Time: 9326-7124 PT Time Calculation (min) (ACUTE ONLY): 29 min  Charges:  $Therapeutic Activity: 23-37 mins                     Mabeline Caras, PT, DPT Acute Rehabilitation Services  Pager [phone] Office [place] 10/24/2020, 5:27 PM

## 2020-10-25 MED ORDER — PREDNISONE 5 MG PO TABS
10.0000 mg | ORAL_TABLET | Freq: Every day | ORAL | Status: AC
  Administered 2020-10-26: 10 mg via ORAL
  Filled 2020-10-25: qty 2

## 2020-10-25 MED ORDER — BOOST / RESOURCE BREEZE PO LIQD CUSTOM
1.0000 | Freq: Two times a day (BID) | ORAL | Status: DC
  Administered 2020-10-29 – 2020-11-06 (×7): 1 via ORAL

## 2020-10-25 MED ORDER — PROSOURCE PLUS PO LIQD
30.0000 mL | Freq: Two times a day (BID) | ORAL | Status: DC
  Administered 2020-10-26: 30 mL via ORAL
  Filled 2020-10-25 (×4): qty 30

## 2020-10-25 NOTE — Progress Notes (Signed)
Patient's oxygen remained 100 % on 25 L.  It was further titrated to 20 L.  Will continue to monitor.

## 2020-10-25 NOTE — Progress Notes (Signed)
Patient is anxious and requested that his oxygen be titrated.  It is now increased to 30 L .  Will continue to monitor.

## 2020-10-25 NOTE — Progress Notes (Addendum)
Nutrition Follow-up  DOCUMENTATION CODES:   Not applicable  INTERVENTION:  Provide Boost Breeze po BID, each supplement provides 250 kcal and 9 grams of protein.  Provide 30 ml Prosource Plus po BID, each supplement provides 100 kcal and 15 grams of protein.   Encourage adequate PO intake.   NUTRITION DIAGNOSIS:   Increased nutrient needs related to acute illness (COVID-19 pneumonia) as evidenced by estimated needs; ongoing  GOAL:   Patient will meet greater than or equal to 90% of their needs; progressing  MONITOR:   PO intake,Supplement acceptance,Labs,Weight trends,I & O's  REASON FOR ASSESSMENT:   Malnutrition Screening Tool    ASSESSMENT:   55 y.o. male with PMHx of  CAD s/p PCI, tobacco use, HLD, GERD-who presented with shortness of breath-found to have acute hypoxic respiratory failure requiring 100% O2 via NRB due to COVID-19 pneumonia. Patient has been COVID-19+ since 12/16.  Pt is currently on 25L HFNC via NRB. Meal completion has been 25-100%. RD to order nutritional supplements to aid in caloric and protein needs. Noted pt previously with Ensure and did not consume them. RD to order Boost Breeze and Prostat instead. Labs and medications reviewed.   Diet Order:   Diet Order            Diet Heart Room service appropriate? No; Fluid consistency: Thin; Fluid restriction: 2000 mL Fluid  Diet effective now                 EDUCATION NEEDS:   No education needs have been identified at this time  Skin:  Skin Assessment: Reviewed RN Assessment  Last BM:  1/12  Height:   Ht Readings from Last 1 Encounters:  09/27/20 5\' 11"  (1.803 m)    Weight:   Wt Readings from Last 1 Encounters:  10/25/20 94.7 kg   BMI:  Body mass index is 29.12 kg/m.  Estimated Nutritional Needs:   Kcal:  2300-2500  Protein:  110-120g  Fluid:  2L/day  Corrin Parker, MS, RD, LDN RD pager number/after hours weekend pager number on Am[employer].

## 2020-10-25 NOTE — Progress Notes (Signed)
   10/25/20 0054  Assess: MEWS Score  Pulse Rate 72  ECG Heart Rate 73  Resp (!) 30  SpO2 97 %  O2 Flow Rate (L/min) 25 L/min  Assess: MEWS Score  MEWS Temp 0  MEWS Systolic 1  MEWS Pulse 0  MEWS RR 2  MEWS LOC 0  MEWS Score 3  MEWS Score Color Yellow  Assess: if the MEWS score is Yellow or Red  Were vital signs taken at a resting state? Yes  Focused Assessment No change from prior assessment  Early Detection of Sepsis Score *See Row Information* Low  MEWS guidelines implemented *See Row Information* Yes  Treat  MEWS Interventions Escalated (See documentation below)  Pain Scale 0-10  Pain Score 0  Take Vital Signs  Increase Vital Sign Frequency  Yellow: Q 2hr X 2 then Q 4hr X 2, if remains yellow, continue Q 4hrs  Escalate  MEWS: Escalate Yellow: discuss with charge nurse/RN and consider discussing with provider and RRT  Notify: Charge Nurse/RN  Name of Charge Nurse/RN Notified Wenda Overland, RN  Date Charge Nurse/RN Notified 10/25/20  Time Charge Nurse/RN Notified 0054  Document  Patient Outcome Other (Comment) (Remain tachypenic)

## 2020-10-25 NOTE — Progress Notes (Signed)
PT Cancellation Note  Patient Details Name: ARMEL RABBANI MRN: [id number] DOB: 12/28/1965   Cancelled Treatment:    Reason Eval/Treat Not Completed: Fatigue/lethargy limiting ability to participate. Patient declining OOB activity despite encouragement; reports, "It takes too much out of me to get over there and to get back here later." Noted patient has declined sitting in recliner the past two days. Pt states, "I promise I'll get in the chair tomorrow..." and "This anxiety is kicking my ass... I've never dealt with this before, I don't know how to..." Will follow-up for PT treatment on Monday.  Mabeline Caras, PT, DPT Acute Rehabilitation Services  Pager [phone] Office [place] 10/25/2020, 4:30 PM

## 2020-10-25 NOTE — TOC Progression Note (Addendum)
Transition of Care [facility] [place]) - Progression Note    Patient Details  Name: Matthew [name] MRN: [id number] Date of Birth: 1966-03-06  Transition of Care [facility]) CM/SW [place], RN Phone Number: 10/25/2020, 9:20 AM  Clinical Narrative:     Oxygen at 30 L continues to have increases in oxygen requirement. Patient is from home with spouse continue to monitor for needs, likely charity oxygen and [employer] for medicaitons.ready sent to [facility] for medicaid assistance  Expected Discharge Plan: Home/Self Care Barriers to Discharge: Continued Medical Work up  Expected Discharge Plan and Services Expected Discharge Plan: Home/Self Care   Discharge Planning Services: CM Consult Post Acute Care Choice: Durable Medical Equipment Living arrangements for the past 2 months: Single Family Home                                       Social Determinants of Health (SDOH) Interventions    Readmission Risk Interventions No flowsheet data found.

## 2020-10-25 NOTE — Progress Notes (Signed)
O2 sat was 100% on 50L.  It was decreased to 25 L and patient continue to sat at 100%.  Respiratory rounded on patient and he was informed of the titration.  He is in bed resting.  Will continue to monitor.

## 2020-10-25 NOTE — Progress Notes (Signed)
PROGRESS NOTE                                                                                                                                                                                                             Patient Demographics:    Matthew [name], is a 55 y.o. male, DOB - 12/28/1965, JHE:[id number]  Outpatient Primary MD for the patient is Patient, No Pcp Per   Admit date - 09/26/2020   LOS - 29  No chief complaint on file.      Brief Narrative: Patient is a 55 y.o. male with PMHx of  CAD s/p PCI, tobacco use, HLD, GERD-who presented with shortness of breath-found to have acute hypoxic respiratory failure requiring 100% O2 via NRB due to COVID-19 pneumonia.  COVID-19 vaccinated status: Unvaccinated  Significant Events: 12/9>> started having GI symptoms including diarrhea 12/13>> to urgent care-for diarrhea-discharged after supportive care/IV fluids 12/16>> Admit to [facility] for hypoxia due to COVID-19 pneumonia-requiring 100% O2 via NRB 12/28>> hemoptysis-PCCM consulted-supportive care recommended. 12/31>> worsening hypoxemia-started on heated high flow-unable to lie flat for a CTA chest  Significant studies: 12/17>> CTA chest: No PE, diffuse groundglass opacities 12/18>> Echo: EF 60-65% 12/26>> chest x-ray: Diffuse bilateral heterogeneous/interstitial airspace opacities 12/31>> chest x-ray: Stable bilateral lung opacities 12/31>> lower extremity Doppler: No DVT 1/5>> chest x-ray: Mild improvement in multifocal opacities 1/10>> chest x-ray: Diffuse bilateral airspace disease   COVID-19 medications: Steroids: 12/16>> Remdesivir: 12/16>> 12/20 Baricitinib: 12/17>>12/30  Antibiotics: None  Microbiology data: None  Procedures: None  Consults: PCCM  DVT prophylaxis: Lovenox at BID dosing    Subjective:   Appears essentially unchanged compared to yesterday.  Anxiety continues to be a major  issue   Assessment  & Plan :   Acute Hypoxic Resp Failure due to Covid 19 Viral pneumonia: Continues to have severe hypoxemia-remains on heated high flow but O2 requirements are slowly coming down-continues to have anxiety spells.  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  Has been on a very slow taper steroids that he will complete on 1/15.  Repeat chest x-ray tomorrow as well.  Once he is a little bit more stable-and on salter high flow-he can be transitioned to a non-COVID unit.    Fever: afebrile O2 requirements:  SpO2: 98 % O2 Flow  Rate (L/min): 25 L/min FiO2 (%): 70 %   COVID-19 Labs: Recent Labs    10/23/20 0034 10/24/20 0121  DDIMER 1.22*  --   CRP 11.6* 10.1*       Component Value Date/Time   BNP 53.8 10/11/2020 1024    No results for input(s): PROCALCITON in the last 168 hours.  Lab Results  Component Value Date   SARSCOV2NAA POSITIVE (A) 09/26/2020   [place] NEGATIVE 02/06/2020   [place] NEGATIVE 11/17/2019    Prone/Incentive Spirometry: encouraged  incentive spirometry use 3-4/hour.  Mildly elevated D-dimer: Secondary to COVID-19 related inflammation-hypoxia has not worsened-he remains stable-has been on prophylactic Lovenox since admission-CTA chest/lower extremity Dopplers have been negative.  On twice daily dosing of Lovenox.  Acute on chronic diastolic heart failure: No evidence of volume overload-supportive care-keep in negative balance-continue to dose IV Lasix intermittently.  Suspect I/os inaccurate-Weight down to 208 pounds (250 pounds on admission)!!  Hemoptysis: Isolated episode that occurred on 12/28-likely related to underlying parenchymal injury from COVID-19 pneumonia.  No further episodes since then.  Continue to monitor closely-if reoccurs-we will reconsult PCCM.  Transaminitis: Due to COVID-19-downtrending-follow periodically  CAD s/p PCI: No anginal symptoms-not on any antiplatelets  at home.  Per patient report-PCI was approximately 11 years back  GERD: Continue PPI  Anxiety: Continues to have significant amount of anxiety-remains on Lexapro/Klonopin-dosage of Lexapro has been gradually increased to 20 mg daily-on 1 mg of Klonopin 3 times daily as needed.  Constipation-continue MiraLAX  Obesity: Estimated body mass index is 29.12 kg/m as calculated from the following:   Height as of this encounter: 5\' 11"  (1.803 m).   Weight as of this encounter: 94.7 kg.    GI prophylaxis: PPI  ABG:    Component Value Date/Time   PHART 7.464 (H) 09/26/2020 2035   PCO2ART 31.7 (L) 09/26/2020 2035   PO2ART 90 09/26/2020 2035   HCO3 22.8 09/26/2020 2035   TCO2 24 09/26/2020 2035   ACIDBASEDEF 0.8 11/17/2019 1618   O2SAT 98.0 09/26/2020 2035    Vent Settings: N/A FiO2 (%):  [50 %-70 %] 70 %  Condition - Guarded  Family Communication  :  Sherlyn Lees Hallett [id number] 1/14  Code Status :  Full Code  Diet :  Diet Order            Diet Heart Room service appropriate? No; Fluid consistency: Thin; Fluid restriction: 2000 mL Fluid  Diet effective now                  Disposition Plan  :   Status is: Inpatient  Remains inpatient appropriate because:Inpatient level of care appropriate due to severity of illness   Dispo: The patient is from: Home              Anticipated d/c is to: Home              Anticipated d/c date is: > 3 days              Patient currently is not medically stable to d/c.   Barriers to discharge: Hypoxia requiring O2 supplementation  Antimicorbials  :    Anti-infectives (From admission, onward)   Start     Dose/Rate Route Frequency Ordered Stop   09/27/20 1000  remdesivir 100 mg in sodium chloride 0.9 % 100 mL IVPB       "Followed by" Linked Group Details   100 mg 200 mL/hr over 30 Minutes Intravenous Daily 09/26/20 2208 09/30/20 1900  09/26/20 2215  remdesivir 200 mg in sodium chloride 0.9% 250 mL IVPB       "Followed  by" Linked Group Details   200 mg 580 mL/hr over 30 Minutes Intravenous Once 09/26/20 2208 09/27/20 0146      Inpatient Medications  Scheduled Meds: . benzonatate  200 mg Oral TID  . enoxaparin (LOVENOX) injection  50 mg Subcutaneous Q12H  . escitalopram  20 mg Oral Daily  . melatonin  3 mg Oral QHS  . multivitamins with iron  1 tablet Oral Daily  . pantoprazole  40 mg Oral Daily  . polyethylene glycol  17 g Oral Daily  . predniSONE  20 mg Oral Q breakfast  . sodium chloride flush  10-40 mL Intracatheter Q12H  . sodium chloride flush  3 mL Intravenous Q12H   Continuous Infusions:  PRN Meds:.acetaminophen, albuterol, bisacodyl, clonazePAM, guaiFENesin-dextromethorphan, menthol-cetylpyridinium, ondansetron (ZOFRAN) IV, senna-docusate, sodium chloride, sodium chloride flush   Time Spent in minutes  15   See all Orders from today for further details   Oren Binet M.D on 10/25/2020 at 12:25 PM  To page go to [email or web address] - use universal password  [facility] -  Office  [phone]    Objective:   Vitals:   10/25/20 0255 10/25/20 0450 10/25/20 0600 10/25/20 0906  BP:  92/74  101/75  Pulse:  73 80 88  Resp:  (!) 24  17  Temp:  98.3 F (36.8 C)  99 F (37.2 C)  TempSrc:  Axillary  Oral  SpO2: 100% 100% 98% 98%  Weight:   94.7 kg   Height:        Wt Readings from Last 3 Encounters:  10/25/20 94.7 kg  12/04/19 108.4 kg  11/20/19 107.9 kg     Intake/Output Summary (Last 24 hours) at 10/25/2020 1225 Last data filed at 10/25/2020 0455 Gross per 24 hour  Intake 240 ml  Output 550 ml  Net -310 ml     Physical Exam Gen Exam:Alert awake-not in any distress HEENT:atraumatic, normocephalic Chest: B/L clear to auscultation anteriorly CVS:S1S2 regular Abdomen:soft non tender, non distended Extremities:no edema Neurology: Non focal Skin: no rash   Data Review:    CBC Recent Labs  Lab 10/20/20 0746 10/21/20 0147 10/22/20 0225 10/23/20 0034  10/24/20 0121  WBC 16.6* 14.1* 14.8* 15.0* 14.0*  HGB 12.9* 12.7* 12.5* 12.1* 12.1*  HCT 36.9* 36.2* 36.2* 36.5* 35.5*  PLT 153 165 181 194 197  MCV 90.4 91.0 91.2 92.4 91.5  MCH 31.6 31.9 31.5 30.6 31.2  MCHC 35.0 35.1 34.5 33.2 34.1  RDW 13.4 13.3 13.4 13.3 13.6    Chemistries  Recent Labs  Lab 10/20/20 0746 10/21/20 0147 10/22/20 0225 10/23/20 0034 10/24/20 0121  NA 135 133* 136 137 136  K 3.6 3.7 3.7 3.4* 3.7  CL 100 98 100 100 99  CO2 24 25 25 25 26   GLUCOSE 126* 115* 104* 121* 135*  BUN 27* 25* 23* 23* 19  CREATININE 0.90 0.83 0.92 0.90 0.95  CALCIUM 8.5* 8.3* 8.5* 8.4* 8.5*  AST 21 16 17 20 25   ALT 96* 75* 63* 59* 62*  ALKPHOS 86 89 98 91 101  BILITOT 1.0 0.8 1.2 1.0 0.9   ------------------------------------------------------------------------------------------------------------------ No results for input(s): CHOL, HDL, LDLCALC, TRIG, CHOLHDL, LDLDIRECT in the last 72 hours.  No results found for: HGBA1C ------------------------------------------------------------------------------------------------------------------ No results for input(s): TSH, T4TOTAL, T3FREE, THYROIDAB in the last 72 hours.  Invalid input(s): FREET3 ------------------------------------------------------------------------------------------------------------------ No results for input(s): VITAMINB12, FOLATE,  FERRITIN, TIBC, IRON, RETICCTPCT in the last 72 hours.  Coagulation profile No results for input(s): INR, PROTIME in the last 168 hours.  Recent Labs    10/23/20 0034  DDIMER 1.22*    Cardiac Enzymes No results for input(s): CKMB, TROPONINI, MYOGLOBIN in the last 168 hours.  Invalid input(s): CK ------------------------------------------------------------------------------------------------------------------    Component Value Date/Time   BNP 53.8 10/11/2020 1024    Micro Results No results found for this or any previous visit (from the past 240 hour(s)).  Radiology  Reports CT ANGIO CHEST PE W OR WO CONTRAST  Result Date: 09/27/2020 CLINICAL DATA:  55 year old male with history of COVID-19, suspected pulmonary embolism. EXAM: CT ANGIOGRAPHY CHEST WITH CONTRAST TECHNIQUE: Multidetector CT imaging of the chest was performed using the standard protocol during bolus administration of intravenous contrast. Multiplanar CT image reconstructions and MIPs were obtained to evaluate the vascular anatomy. CONTRAST:  100 mL Omnipaque 350, intravenous COMPARISON:  Chest CT from 11/17/2019 FINDINGS: Cardiovascular: Satisfactory opacification of the pulmonary arteries to the segmental level. No evidence of pulmonary embolism. Normal heart size. No pericardial effusion. The heart is normal in size. No pericardial effusion. Severe coronary atherosclerotic calcifications. Mediastinum/Nodes: Similar appearing prominent right paratracheal lymph node measuring up to 1.2 cm in short axis. Otherwise no enlarged mediastinal, hilar, or axillary lymph nodes. Thyroid gland, trachea, and esophagus demonstrate no significant findings. Lungs/Pleura: Interval development of near diffuse, peripherally predominant ground-glass pulmonary opacities with a mid lung and basal predominance. Similar appearing moderate upper lobe predominant paraseptal and centrilobular emphysema. No pleural effusion or pneumothorax. Upper Abdomen: The visualized upper abdomen is within normal limits. Musculoskeletal: No chest wall abnormality. No acute or significant osseous findings. Review of the MIP images confirms the above findings. IMPRESSION: Vascular: 1. No evidence of pulmonary embolism. 2. Severe coronary atherosclerotic calcifications. Non-Vascular: 1. Diffuse, mid lung and basilar, peripherally predominant ground-glass opacities - findings compatible with multifocal pneumonia associated with COVID-19. 2. Similar appearing moderate upper lobe predominant centrilobular and paraseptal emphysema. Electronically Signed    By: Ruthann Cancer MD   On: 09/27/2020 14:31   DG Chest Port 1 View  Result Date: 10/21/2020 CLINICAL DATA:  Short of breath.  COVID positive EXAM: PORTABLE CHEST 1 VIEW COMPARISON:  None. FINDINGS: Low lung volumes. normal cardiac silhouette. There is bilateral diffuse fine airspace disease. No pleural fluid. No pneumothorax. IMPRESSION: Bilateral diffuse airspace disease consistent with viral pneumonia. Electronically Signed   By: Suzy Bouchard M.D.   On: 10/21/2020 08:17   DG Chest Port 1 View  Result Date: 10/06/2020 CLINICAL DATA:  COVID positive, pleural effusion, pneumothorax EXAM: PORTABLE CHEST 1 VIEW COMPARISON:  09/26/2020 FINDINGS: Slight interval increase in diffuse bilateral heterogeneous and interstitial airspace opacity, most conspicuous at the lung bases. No significant pleural effusion noted. No pneumothorax. The heart and mediastinum are unremarkable. IMPRESSION: 1. Slight interval increase in diffuse bilateral heterogeneous and interstitial airspace opacity, most conspicuous at the lung bases, consistent with worsened COVID airspace disease. 2.  No significant pleural effusion noted. No pneumothorax. Electronically Signed   By: Eddie Candle M.D.   On: 10/06/2020 09:37   DG CHEST PORT 1 VIEW  Result Date: 09/26/2020 CLINICAL DATA:  COVID-19 EXAM: PORTABLE CHEST 1 VIEW COMPARISON:  09/26/2020 FINDINGS: Slight worsening of bilateral basilar predominant opacities. No pleural effusion or pneumothorax. Normal cardiomediastinal contours. IMPRESSION: Slight worsening of bilateral basilar predominant opacities. Electronically Signed   By: Ulyses Jarred M.D.   On: 09/26/2020 23:26   DG Chest Portable  1 View  Result Date: 09/26/2020 CLINICAL DATA:  Cough and shortness of breath. EXAM: PORTABLE CHEST 1 VIEW COMPARISON:  12/04/2019 FINDINGS: Cardiomegaly which is increased from prior exam. Pulmonary edema, moderate in degree. There are bilateral pleural effusions. No confluent  consolidation. No pneumothorax. No acute osseous abnormalities are seen. IMPRESSION: CHF with cardiomegaly, pulmonary edema and bilateral pleural effusions. Electronically Signed   By: Keith Rake M.D.   On: 09/26/2020 19:14   DG Chest Port 1V same Day  Result Date: 10/16/2020 CLINICAL DATA:  COVID positive, short of breath EXAM: PORTABLE CHEST 1 VIEW COMPARISON:  None. FINDINGS: Normal cardiac silhouette. Bilateral peripheral fine airspace disease appears slightly less dense than comparison exam. No pneumothorax. No focal consolidation. IMPRESSION: Mild improvement in bilateral multifocal viral pneumonia. Electronically Signed   By: Suzy Bouchard M.D.   On: 10/16/2020 13:32   DG Chest Port 1V same Day  Result Date: 10/13/2020 CLINICAL DATA:  55 year old male with respiratory failure. Recent bronchoscopy and hemoptysis. History of COVID-19. EXAM: PORTABLE CHEST 1 VIEW COMPARISON:  Portable chest 10/11/2020 and earlier. FINDINGS: Portable AP semi upright view at 0554 hours. Lower lung volumes. Stable cardiac size and mediastinal contours. Visualized tracheal air column is within normal limits. Diffuse reticulonodular pulmonary opacity with confluent density in the peripheral right lung and at the lung bases now worse on the right. No pneumothorax. No definite pleural effusion. Stable visualized osseous structures. IMPRESSION: Continued diffuse reticulonodular opacity with lower lung volumes and worsening ventilation since 10/11/2020 at both lung bases. Electronically Signed   By: Genevie Ann M.D.   On: 10/13/2020 07:57   DG Chest Port 1V same Day  Result Date: 10/11/2020 CLINICAL DATA:  Hypoxia.  Shortness of breath. EXAM: PORTABLE CHEST 1 VIEW COMPARISON:  October 06, 2020. FINDINGS: The heart size and mediastinal contours are within normal limits. No pneumothorax or pleural effusion is noted. Stable bilateral lung opacities are noted concerning for multifocal pneumonia. The visualized skeletal  structures are unremarkable. IMPRESSION: Stable bilateral lung opacities are noted concerning for multifocal pneumonia. Electronically Signed   By: Marijo Conception M.D.   On: 10/11/2020 10:06   VAS [place] LOWER EXTREMITY VENOUS (DVT)  Result Date: 10/12/2020  Lower Venous DVT Study Indications: Worsening hypoxia, Covid-19.  Comparison Study: No prior studies. Performing Technologist: Darlin Coco, RDMS  Examination Guidelines: A complete evaluation includes B-mode imaging, spectral Doppler, color Doppler, and power Doppler as needed of all accessible portions of each vessel. Bilateral testing is considered an integral part of a complete examination. Limited examinations for reoccurring indications may be performed as noted. The reflux portion of the exam is performed with the patient in reverse Trendelenburg.  +---------+---------------+---------+-----------+----------+--------------+ RIGHT    CompressibilityPhasicitySpontaneityPropertiesThrombus Aging +---------+---------------+---------+-----------+----------+--------------+ CFV      Full           Yes      Yes                                 +---------+---------------+---------+-----------+----------+--------------+ SFJ      Full                                                        +---------+---------------+---------+-----------+----------+--------------+ FV Prox  Full                                                        +---------+---------------+---------+-----------+----------+--------------+  FV Mid   Full                                                        +---------+---------------+---------+-----------+----------+--------------+ FV DistalFull                                                        +---------+---------------+---------+-----------+----------+--------------+ PFV      Full                                                         +---------+---------------+---------+-----------+----------+--------------+ POP      Full           Yes      Yes                                 +---------+---------------+---------+-----------+----------+--------------+ PTV      Full                                                        +---------+---------------+---------+-----------+----------+--------------+ PERO     Full                                                        +---------+---------------+---------+-----------+----------+--------------+   +---------+---------------+---------+-----------+----------+--------------+ LEFT     CompressibilityPhasicitySpontaneityPropertiesThrombus Aging +---------+---------------+---------+-----------+----------+--------------+ CFV      Full           Yes      Yes                                 +---------+---------------+---------+-----------+----------+--------------+ SFJ      Full                                                        +---------+---------------+---------+-----------+----------+--------------+ FV Prox  Full                                                        +---------+---------------+---------+-----------+----------+--------------+ FV Mid   Full                                                        +---------+---------------+---------+-----------+----------+--------------+  FV DistalFull                                                        +---------+---------------+---------+-----------+----------+--------------+ PFV      Full                                                        +---------+---------------+---------+-----------+----------+--------------+ POP      Full           Yes      Yes                                 +---------+---------------+---------+-----------+----------+--------------+ PTV      Full                                                         +---------+---------------+---------+-----------+----------+--------------+ PERO     Full                                                        +---------+---------------+---------+-----------+----------+--------------+     Summary: RIGHT: - There is no evidence of deep vein thrombosis in the lower extremity.  - No cystic structure found in the popliteal fossa.  LEFT: - There is no evidence of deep vein thrombosis in the lower extremity.  - No cystic structure found in the popliteal fossa.  *See table(s) above for measurements and observations. Electronically signed by Ruta Hinds MD on 10/12/2020 at 10:00:37 AM.    Final    ECHOCARDIOGRAM LIMITED  Result Date: 09/28/2020    ECHOCARDIOGRAM LIMITED REPORT   Patient Name:   Matthew [name] Date of Exam: 09/28/2020 Medical Rec #:  [id number]          Height:       71.0 in Accession #:    [id number]         Weight:       244.3 lb Date of Birth:  04/12/1966          BSA:          2.296 m Patient Age:    55 years           BP:           120/84 mmHg Patient Gender: M                  HR:           73 bpm. Exam Location:  Inpatient Procedure: Limited Echo, Limited Color Doppler and Cardiac Doppler Indications:    acute respiratory distress  History:        Patient has prior history of Echocardiogram examinations, most                 recent 03/30/2016. CAD;  Covid.  Sonographer:    Johny Chess Referring Phys: [id number] Candace Gallus MELVIN  Sonographer Comments: Image acquisition challenging due to respiratory motion. IMPRESSIONS  1. Left ventricular ejection fraction, by estimation, is 60 to 65%. The left ventricle has normal function. The left ventricle has no regional wall motion abnormalities. There is mild left ventricular hypertrophy.  2. Right ventricular systolic function is normal. The right ventricular size is normal.  3. The mitral valve is normal in structure. No evidence of mitral valve regurgitation. No evidence of mitral stenosis.  4. The  aortic valve is tricuspid.  5. The inferior vena cava is normal in size with greater than 50% respiratory variability, suggesting right atrial pressure of 3 mmHg. FINDINGS  Left Ventricle: Left ventricular ejection fraction, by estimation, is 60 to 65%. The left ventricle has normal function. The left ventricle has no regional wall motion abnormalities. The left ventricular internal cavity size was normal in size. There is  mild left ventricular hypertrophy. Right Ventricle: The right ventricular size is normal. No increase in right ventricular wall thickness. Right ventricular systolic function is normal. Pericardium: There is no evidence of pericardial effusion. Mitral Valve: The mitral valve is normal in structure. No evidence of mitral valve stenosis. Tricuspid Valve: The tricuspid valve is normal in structure. Tricuspid valve regurgitation is not demonstrated. No evidence of tricuspid stenosis. Aortic Valve: The aortic valve is tricuspid. Pulmonic Valve: The pulmonic valve was not well visualized. Pulmonic valve regurgitation is not visualized. No evidence of pulmonic stenosis. Aorta: The aortic root is normal in size and structure. Pulmonary Artery: Indeterminant PASP, inadequate TR jet. Venous: The inferior vena cava is normal in size with greater than 50% respiratory variability, suggesting right atrial pressure of 3 mmHg. IAS/Shunts: No atrial level shunt detected by color flow Doppler. LEFT VENTRICLE PLAX 2D LVIDd:         4.20 cm LVIDs:         2.80 cm LV PW:         1.30 cm LV IVS:        1.10 cm LVOT diam:     2.00 cm LV SV:         71 LV SV Index:   31 LVOT Area:     3.14 cm  IVC IVC diam: 1.70 cm LEFT ATRIUM         Index LA diam:    4.30 cm 1.87 cm/m  AORTIC VALVE LVOT Vmax:   116.00 cm/s LVOT Vmean:  76.100 cm/s LVOT VTI:    0.227 m  AORTA Ao Root diam: 3.10 cm MITRAL VALVE MV Area (PHT): 2.91 cm    SHUNTS MV Decel Time: 261 msec    Systemic VTI:  0.23 m MV E velocity: 75.40 cm/s  Systemic Diam:  2.00 cm MV A velocity: 66.00 cm/s MV E/A ratio:  1.14 Carlyle Dolly MD Electronically signed by Carlyle Dolly MD Signature Date/Time: 09/28/2020/2:52:33 PM    Final

## 2020-10-25 NOTE — Progress Notes (Signed)
VAST to pt's bedside to change midline dressing per protocol. Upon attempting to flush midline, noted it to be occluded. Upon removal of midline, noted kinking in the device itself. Pt tolerated well.

## 2020-10-26 ENCOUNTER — Inpatient Hospital Stay (HOSPITAL_COMMUNITY): Payer: HRSA Program

## 2020-10-26 LAB — COMPREHENSIVE METABOLIC PANEL
ALT: 54 U/L — ABNORMAL HIGH (ref 0–44)
AST: 20 U/L (ref 15–41)
Albumin: 2.4 g/dL — ABNORMAL LOW (ref 3.5–5.0)
Alkaline Phosphatase: 101 U/L (ref 38–126)
Anion gap: 11 (ref 5–15)
BUN: 17 mg/dL (ref 6–20)
CO2: 26 mmol/L (ref 22–32)
Calcium: 8.5 mg/dL — ABNORMAL LOW (ref 8.9–10.3)
Chloride: 100 mmol/L (ref 98–111)
Creatinine, Ser: 0.92 mg/dL (ref 0.61–1.24)
GFR, Estimated: >60 mL/min (ref >60)
Glucose, Bld: 92 mg/dL (ref 70–99)
Potassium: 3.3 mmol/L — ABNORMAL LOW (ref 3.5–5.1)
Sodium: 137 mmol/L (ref 135–145)
Total Bilirubin: 0.8 mg/dL (ref 0.3–1.2)
Total Protein: 6.8 g/dL (ref 6.5–8.1)

## 2020-10-26 LAB — CBC
HCT: 36.2 % — ABNORMAL LOW (ref 39.0–52.0)
Hemoglobin: 11.8 g/dL — ABNORMAL LOW (ref 13.0–17.0)
MCH: 30.6 pg (ref 26.0–34.0)
MCHC: 32.6 g/dL (ref 30.0–36.0)
MCV: 94 fL (ref 80.0–100.0)
Platelets: 219 10*3/uL (ref 150–400)
RBC: 3.85 MIL/uL — ABNORMAL LOW (ref 4.22–5.81)
RDW: 13.7 % (ref 11.5–15.5)
WBC: 13.1 10*3/uL — ABNORMAL HIGH (ref 4.0–10.5)
nRBC: 0 % (ref 0.0–0.2)

## 2020-10-26 LAB — C-REACTIVE PROTEIN: CRP: 12.2 mg/dL — ABNORMAL HIGH (ref <1.0)

## 2020-10-26 MED ORDER — POTASSIUM CHLORIDE CRYS ER 20 MEQ PO TBCR
40.0000 meq | EXTENDED_RELEASE_TABLET | Freq: Once | ORAL | Status: AC
  Administered 2020-10-26: 40 meq via ORAL
  Filled 2020-10-26: qty 2

## 2020-10-26 NOTE — Progress Notes (Signed)
PROGRESS NOTE                                                                                                                                                                                                             Patient Demographics:    Matthew [name], is a 55 y.o. male, DOB - 1966-03-30, ZL:[id number]  Outpatient Primary MD for the patient is Patient, No Pcp Per   Admit date - 09/26/2020   LOS - 30  No chief complaint on file.      Brief Narrative: Patient is a 55 y.o. male with PMHx of  CAD s/p PCI, tobacco use, HLD, GERD-who presented with shortness of breath-found to have acute hypoxic respiratory failure requiring 100% O2 via NRB due to COVID-19 pneumonia.  COVID-19 vaccinated status: Unvaccinated  Significant Events: 12/9>> started having GI symptoms including diarrhea 12/13>> to urgent care-for diarrhea-discharged after supportive care/IV fluids 12/16>> Admit to [facility] for hypoxia due to COVID-19 pneumonia-requiring 100% O2 via NRB 12/28>> hemoptysis-PCCM consulted-supportive care recommended. 12/31>> worsening hypoxemia-started on heated high flow-unable to lie flat for a CTA chest  Significant studies: 12/17>> CTA chest: No PE, diffuse groundglass opacities 12/18>> Echo: EF 60-65% 12/26>> chest x-ray: Diffuse bilateral heterogeneous/interstitial airspace opacities 12/31>> chest x-ray: Stable bilateral lung opacities 12/31>> lower extremity Doppler: No DVT 1/5>> chest x-ray: Mild improvement in multifocal opacities 1/10>> chest x-ray: Diffuse bilateral airspace disease 1/15>> chest x-ray: Persistent diffuse bilateral hazy opacities-similar/slightly improved from prior study   COVID-19 medications: Steroids: 12/16>>1/15 Remdesivir: 12/16>> 12/20 Baricitinib: 12/17>>12/30  Antibiotics: None  Microbiology data: None  Procedures: None  Consults: PCCM  DVT prophylaxis: Lovenox at BID  dosing    Subjective:   Lying comfortably in bed-no major issues overnight.  Able to be on heated high flow only-but does require NRB with anxiety spells and movement.   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  Has been on a very slow taper steroids that he will complete on 1/15.   Once  he is a little bit more stable-and on salter high flow-he can be transitioned to a non-COVID unit.    Fever: afebrile O2 requirements:  SpO2: 93 % O2 Flow Rate (L/min): 30 L/min FiO2 (%): 100 %   COVID-19 Labs: Recent Labs    10/24/20 0121 10/26/20 0749  CRP 10.1* 12.2*       Component Value Date/Time   BNP 53.8 10/11/2020 1024    No results for input(s): PROCALCITON in the last 168 hours.  Lab Results  Component Value Date   SARSCOV2NAA POSITIVE (A) 09/26/2020   [place] NEGATIVE 02/06/2020   [place] NEGATIVE 11/17/2019    Prone/Incentive Spirometry: encouraged  incentive spirometry use 3-4/hour.  Mildly elevated D-dimer: Secondary to COVID-19 related inflammation-hypoxia has not worsened-he remains stable-has been on prophylactic Lovenox since admission-CTA chest/lower extremity Dopplers have been negative.  On twice daily dosing of Lovenox.  Acute on chronic diastolic heart failure: No evidence of volume overload-supportive care-keep in negative balance-continue to dose IV Lasix intermittently.  Suspect I/os inaccurate-Weight down to 208 pounds (250 pounds on admission)!!  Hemoptysis: Isolated episode that occurred on 12/28-likely related to underlying parenchymal injury from COVID-19 pneumonia.  No further episodes  since then.  Continue to monitor closely-if reoccurs-we will reconsult PCCM.  Hypokalemia: Replete and recheck.  Transaminitis: Due to COVID-19-downtrending-follow periodically  CAD s/p PCI: No anginal symptoms-not on any antiplatelets at home.  Per patient report-PCI was approximately 11 years back  GERD: Continue PPI  Anxiety: Continues to have significant amount of anxiety-remains on Lexapro/Klonopin-dosage of Lexapro has been gradually increased to 20 mg daily-on 1 mg of Klonopin 3 times daily as needed.  Constipation-continue MiraLAX  Obesity: Estimated body mass index is 28.87 kg/m as calculated from the following:   Height as of this encounter: 5\' 11"  (1.803 m).   Weight as of this encounter: 93.9 kg.    GI prophylaxis: PPI  ABG:    Component Value Date/Time   PHART 7.464 (H) 09/26/2020 2035   PCO2ART 31.7 (L) 09/26/2020 2035   PO2ART 90 09/26/2020 2035   HCO3 22.8 09/26/2020 2035   TCO2 24 09/26/2020 2035   ACIDBASEDEF 0.8 11/17/2019 1618   O2SAT 98.0 09/26/2020 2035    Vent Settings: N/A FiO2 (%):  [60 %-100 %] 100 %  Condition - Guarded  Family Communication  :  Carry Mcrea [id number] 1/15  Code Status :  Full Code  Diet :  Diet Order            Diet Heart Room service appropriate? No; Fluid consistency: Thin; Fluid restriction: 2000 mL Fluid  Diet effective now                  Disposition Plan  :   Status is: Inpatient  Remains inpatient appropriate because:Inpatient level of care appropriate due to severity of illness   Dispo: The patient is from: Home              Anticipated d/c is to: Home              Anticipated d/c date is: > 3 days              Patient currently is not medically stable to d/c.   Barriers to discharge: Hypoxia requiring O2 supplementation  Antimicorbials  :    Anti-infectives (From admission, onward)   Start     Dose/Rate Route Frequency Ordered Stop   09/27/20 1000  remdesivir 100 mg in  sodium chloride 0.9 %  100 mL IVPB       "Followed by" Linked Group Details   100 mg 200 mL/hr over 30 Minutes Intravenous Daily 09/26/20 2208 09/30/20 1900   09/26/20 2215  remdesivir 200 mg in sodium chloride 0.9% 250 mL IVPB       "Followed by" Linked Group Details   200 mg 580 mL/hr over 30 Minutes Intravenous Once 09/26/20 2208 09/27/20 0146      Inpatient Medications  Scheduled Meds: . (feeding supplement) PROSource Plus  30 mL Oral BID BM  . benzonatate  200 mg Oral TID  . enoxaparin (LOVENOX) injection  50 mg Subcutaneous Q12H  . escitalopram  20 mg Oral Daily  . feeding supplement  1 Container Oral BID BM  . melatonin  3 mg Oral QHS  . multivitamins with iron  1 tablet Oral Daily  . pantoprazole  40 mg Oral Daily  . polyethylene glycol  17 g Oral Daily  . sodium chloride flush  10-40 mL Intracatheter Q12H  . sodium chloride flush  3 mL Intravenous Q12H   Continuous Infusions:  PRN Meds:.acetaminophen, albuterol, bisacodyl, clonazePAM, guaiFENesin-dextromethorphan, menthol-cetylpyridinium, ondansetron (ZOFRAN) IV, senna-docusate, sodium chloride, sodium chloride flush   Time Spent in minutes  25   See all Orders from today for further details   Oren Binet M.D on 10/26/2020 at 11:23 AM  To page go to [email or web address] - use universal password  [facility] -  Office  [phone]    Objective:   Vitals:   10/26/20 0356 10/26/20 0500 10/26/20 0800 10/26/20 0803  BP: 97/75  94/67   Pulse: 68  81   Resp: (!) 30  (!) 24   Temp: 97.8 F (36.6 C)  98.7 F (37.1 C)   TempSrc:   Axillary   SpO2: 95% (!) 81% 95% 93%  Weight: 93.9 kg     Height:        Wt Readings from Last 3 Encounters:  10/26/20 93.9 kg  12/04/19 108.4 kg  11/20/19 107.9 kg     Intake/Output Summary (Last 24 hours) at 10/26/2020 1123 Last data filed at 10/26/2020 0401 Gross per 24 hour  Intake 480 ml  Output 450 ml  Net 30 ml     Physical Exam Gen Exam:Alert awake-not in  any distress HEENT:atraumatic, normocephalic Chest: B/L clear to auscultation anteriorly CVS:S1S2 regular Abdomen:soft non tender, non distended Extremities:no edema Neurology: Non focal Skin: no rash   Data Review:    CBC Recent Labs  Lab 10/21/20 0147 10/22/20 0225 10/23/20 0034 10/24/20 0121 10/26/20 0257  WBC 14.1* 14.8* 15.0* 14.0* 13.1*  HGB 12.7* 12.5* 12.1* 12.1* 11.8*  HCT 36.2* 36.2* 36.5* 35.5* 36.2*  PLT 165 181 194 197 219  MCV 91.0 91.2 92.4 91.5 94.0  MCH 31.9 31.5 30.6 31.2 30.6  MCHC 35.1 34.5 33.2 34.1 32.6  RDW 13.3 13.4 13.3 13.6 13.7    Chemistries  Recent Labs  Lab 10/21/20 0147 10/22/20 0225 10/23/20 0034 10/24/20 0121 10/26/20 0257  NA 133* 136 137 136 137  K 3.7 3.7 3.4* 3.7 3.3*  CL 98 100 100 99 100  CO2 25 25 25 26 26   GLUCOSE 115* 104* 121* 135* 92  BUN 25* 23* 23* 19 17  CREATININE 0.83 0.92 0.90 0.95 0.92  CALCIUM 8.3* 8.5* 8.4* 8.5* 8.5*  AST 16 17 20 25 20   ALT 75* 63* 59* 62* 54*  ALKPHOS 89 98 91 101 101  BILITOT 0.8 1.2 1.0 0.9 0.8   ------------------------------------------------------------------------------------------------------------------ No  results for input(s): CHOL, HDL, LDLCALC, TRIG, CHOLHDL, LDLDIRECT in the last 72 hours.  No results found for: HGBA1C ------------------------------------------------------------------------------------------------------------------ No results for input(s): TSH, T4TOTAL, T3FREE, THYROIDAB in the last 72 hours.  Invalid input(s): FREET3 ------------------------------------------------------------------------------------------------------------------ No results for input(s): VITAMINB12, FOLATE, FERRITIN, TIBC, IRON, RETICCTPCT in the last 72 hours.  Coagulation profile No results for input(s): INR, PROTIME in the last 168 hours.  No results for input(s): DDIMER in the last 72 hours.  Cardiac Enzymes No results for input(s): CKMB, TROPONINI, MYOGLOBIN in the last 168  hours.  Invalid input(s): CK ------------------------------------------------------------------------------------------------------------------    Component Value Date/Time   BNP 53.8 10/11/2020 1024    Micro Results No results found for this or any previous visit (from the past 240 hour(s)).  Radiology Reports CT ANGIO CHEST PE W OR WO CONTRAST  Result Date: 09/27/2020 CLINICAL DATA:  55 year old male with history of COVID-19, suspected pulmonary embolism. EXAM: CT ANGIOGRAPHY CHEST WITH CONTRAST TECHNIQUE: Multidetector CT imaging of the chest was performed using the standard protocol during bolus administration of intravenous contrast. Multiplanar CT image reconstructions and MIPs were obtained to evaluate the vascular anatomy. CONTRAST:  100 mL Omnipaque 350, intravenous COMPARISON:  Chest CT from 11/17/2019 FINDINGS: Cardiovascular: Satisfactory opacification of the pulmonary arteries to the segmental level. No evidence of pulmonary embolism. Normal heart size. No pericardial effusion. The heart is normal in size. No pericardial effusion. Severe coronary atherosclerotic calcifications. Mediastinum/Nodes: Similar appearing prominent right paratracheal lymph node measuring up to 1.2 cm in short axis. Otherwise no enlarged mediastinal, hilar, or axillary lymph nodes. Thyroid gland, trachea, and esophagus demonstrate no significant findings. Lungs/Pleura: Interval development of near diffuse, peripherally predominant ground-glass pulmonary opacities with a mid lung and basal predominance. Similar appearing moderate upper lobe predominant paraseptal and centrilobular emphysema. No pleural effusion or pneumothorax. Upper Abdomen: The visualized upper abdomen is within normal limits. Musculoskeletal: No chest wall abnormality. No acute or significant osseous findings. Review of the MIP images confirms the above findings. IMPRESSION: Vascular: 1. No evidence of pulmonary embolism. 2. Severe coronary  atherosclerotic calcifications. Non-Vascular: 1. Diffuse, mid lung and basilar, peripherally predominant ground-glass opacities - findings compatible with multifocal pneumonia associated with COVID-19. 2. Similar appearing moderate upper lobe predominant centrilobular and paraseptal emphysema. Electronically Signed   By: Ruthann Cancer MD   On: 09/27/2020 14:31   DG Chest Port 1 View  Result Date: 10/26/2020 CLINICAL DATA:  COVID pneumonia EXAM: PORTABLE CHEST 1 VIEW COMPARISON:  October 21, 2020 FINDINGS: The heart size is stable. Diffuse bilateral hazy airspace opacities are again noted. These appeared to be nearly stable from prior study if not slightly improved. There is no definite pneumothorax. There are probable small bilateral pleural effusions. There is no acute osseous abnormality. IMPRESSION: Persistent diffuse bilateral hazy airspace opacities, similar to slightly improved from prior study. Electronically Signed   By: Constance Holster M.D.   On: 10/26/2020 06:11   DG Chest Port 1 View  Result Date: 10/21/2020 CLINICAL DATA:  Short of breath.  COVID positive EXAM: PORTABLE CHEST 1 VIEW COMPARISON:  None. FINDINGS: Low lung volumes. normal cardiac silhouette. There is bilateral diffuse fine airspace disease. No pleural fluid. No pneumothorax. IMPRESSION: Bilateral diffuse airspace disease consistent with viral pneumonia. Electronically Signed   By: Suzy Bouchard M.D.   On: 10/21/2020 08:17   DG Chest Port 1 View  Result Date: 10/06/2020 CLINICAL DATA:  COVID positive, pleural effusion, pneumothorax EXAM: PORTABLE CHEST 1 VIEW COMPARISON:  09/26/2020 FINDINGS: Slight interval  increase in diffuse bilateral heterogeneous and interstitial airspace opacity, most conspicuous at the lung bases. No significant pleural effusion noted. No pneumothorax. The heart and mediastinum are unremarkable. IMPRESSION: 1. Slight interval increase in diffuse bilateral heterogeneous and interstitial airspace  opacity, most conspicuous at the lung bases, consistent with worsened COVID airspace disease. 2.  No significant pleural effusion noted. No pneumothorax. Electronically Signed   By: Eddie Candle M.D.   On: 10/06/2020 09:37   DG CHEST PORT 1 VIEW  Result Date: 09/26/2020 CLINICAL DATA:  COVID-19 EXAM: PORTABLE CHEST 1 VIEW COMPARISON:  09/26/2020 FINDINGS: Slight worsening of bilateral basilar predominant opacities. No pleural effusion or pneumothorax. Normal cardiomediastinal contours. IMPRESSION: Slight worsening of bilateral basilar predominant opacities. Electronically Signed   By: Ulyses Jarred M.D.   On: 09/26/2020 23:26   DG Chest Portable 1 View  Result Date: 09/26/2020 CLINICAL DATA:  Cough and shortness of breath. EXAM: PORTABLE CHEST 1 VIEW COMPARISON:  12/04/2019 FINDINGS: Cardiomegaly which is increased from prior exam. Pulmonary edema, moderate in degree. There are bilateral pleural effusions. No confluent consolidation. No pneumothorax. No acute osseous abnormalities are seen. IMPRESSION: CHF with cardiomegaly, pulmonary edema and bilateral pleural effusions. Electronically Signed   By: Keith Rake M.D.   On: 09/26/2020 19:14   DG Chest Port 1V same Day  Result Date: 10/16/2020 CLINICAL DATA:  COVID positive, short of breath EXAM: PORTABLE CHEST 1 VIEW COMPARISON:  None. FINDINGS: Normal cardiac silhouette. Bilateral peripheral fine airspace disease appears slightly less dense than comparison exam. No pneumothorax. No focal consolidation. IMPRESSION: Mild improvement in bilateral multifocal viral pneumonia. Electronically Signed   By: Suzy Bouchard M.D.   On: 10/16/2020 13:32   DG Chest Port 1V same Day  Result Date: 10/13/2020 CLINICAL DATA:  55 year old male with respiratory failure. Recent bronchoscopy and hemoptysis. History of COVID-19. EXAM: PORTABLE CHEST 1 VIEW COMPARISON:  Portable chest 10/11/2020 and earlier. FINDINGS: Portable AP semi upright view at 0554 hours.  Lower lung volumes. Stable cardiac size and mediastinal contours. Visualized tracheal air column is within normal limits. Diffuse reticulonodular pulmonary opacity with confluent density in the peripheral right lung and at the lung bases now worse on the right. No pneumothorax. No definite pleural effusion. Stable visualized osseous structures. IMPRESSION: Continued diffuse reticulonodular opacity with lower lung volumes and worsening ventilation since 10/11/2020 at both lung bases. Electronically Signed   By: Genevie Ann M.D.   On: 10/13/2020 07:57   DG Chest Port 1V same Day  Result Date: 10/11/2020 CLINICAL DATA:  Hypoxia.  Shortness of breath. EXAM: PORTABLE CHEST 1 VIEW COMPARISON:  October 06, 2020. FINDINGS: The heart size and mediastinal contours are within normal limits. No pneumothorax or pleural effusion is noted. Stable bilateral lung opacities are noted concerning for multifocal pneumonia. The visualized skeletal structures are unremarkable. IMPRESSION: Stable bilateral lung opacities are noted concerning for multifocal pneumonia. Electronically Signed   By: Marijo Conception M.D.   On: 10/11/2020 10:06   VAS [place] LOWER EXTREMITY VENOUS (DVT)  Result Date: 10/12/2020  Lower Venous DVT Study Indications: Worsening hypoxia, Covid-19.  Comparison Study: No prior studies. Performing Technologist: Darlin Coco, RDMS  Examination Guidelines: A complete evaluation includes B-mode imaging, spectral Doppler, color Doppler, and power Doppler as needed of all accessible portions of each vessel. Bilateral testing is considered an integral part of a complete examination. Limited examinations for reoccurring indications may be performed as noted. The reflux portion of the exam is performed with the patient in reverse  Trendelenburg.  +---------+---------------+---------+-----------+----------+--------------+ RIGHT    CompressibilityPhasicitySpontaneityPropertiesThrombus Aging  +---------+---------------+---------+-----------+----------+--------------+ CFV      Full           Yes      Yes                                 +---------+---------------+---------+-----------+----------+--------------+ SFJ      Full                                                        +---------+---------------+---------+-----------+----------+--------------+ FV Prox  Full                                                        +---------+---------------+---------+-----------+----------+--------------+ FV Mid   Full                                                        +---------+---------------+---------+-----------+----------+--------------+ FV DistalFull                                                        +---------+---------------+---------+-----------+----------+--------------+ PFV      Full                                                        +---------+---------------+---------+-----------+----------+--------------+ POP      Full           Yes      Yes                                 +---------+---------------+---------+-----------+----------+--------------+ PTV      Full                                                        +---------+---------------+---------+-----------+----------+--------------+ PERO     Full                                                        +---------+---------------+---------+-----------+----------+--------------+   +---------+---------------+---------+-----------+----------+--------------+ LEFT     CompressibilityPhasicitySpontaneityPropertiesThrombus Aging +---------+---------------+---------+-----------+----------+--------------+ CFV      Full           Yes      Yes                                 +---------+---------------+---------+-----------+----------+--------------+  SFJ      Full                                                         +---------+---------------+---------+-----------+----------+--------------+ FV Prox  Full                                                        +---------+---------------+---------+-----------+----------+--------------+ FV Mid   Full                                                        +---------+---------------+---------+-----------+----------+--------------+ FV DistalFull                                                        +---------+---------------+---------+-----------+----------+--------------+ PFV      Full                                                        +---------+---------------+---------+-----------+----------+--------------+ POP      Full           Yes      Yes                                 +---------+---------------+---------+-----------+----------+--------------+ PTV      Full                                                        +---------+---------------+---------+-----------+----------+--------------+ PERO     Full                                                        +---------+---------------+---------+-----------+----------+--------------+     Summary: RIGHT: - There is no evidence of deep vein thrombosis in the lower extremity.  - No cystic structure found in the popliteal fossa.  LEFT: - There is no evidence of deep vein thrombosis in the lower extremity.  - No cystic structure found in the popliteal fossa.  *See table(s) above for measurements and observations. Electronically signed by Ruta Hinds MD on 10/12/2020 at 10:00:37 AM.    Final    ECHOCARDIOGRAM LIMITED  Result Date: 09/28/2020    ECHOCARDIOGRAM LIMITED REPORT   Patient Name:   Jadden Loletha Grayer Beranek Date of Exam: 09/28/2020 Medical Rec #:  [id number]  Height:       71.0 in Accession #:    [id number]         Weight:       244.3 lb Date of Birth:  [birth year]          BSA:          2.296 m Patient Age:    50 years           BP:           120/84 mmHg Patient  Gender: M                  HR:           73 bpm. Exam Location:  Inpatient Procedure: Limited Echo, Limited Color Doppler and Cardiac Doppler Indications:    acute respiratory distress  History:        Patient has prior history of Echocardiogram examinations, most                 recent 03/30/2016. CAD; Covid.  Sonographer:    Johny Chess Referring Phys: [id number] Candace Gallus MELVIN  Sonographer Comments: Image acquisition challenging due to respiratory motion. IMPRESSIONS  1. Left ventricular ejection fraction, by estimation, is 60 to 65%. The left ventricle has normal function. The left ventricle has no regional wall motion abnormalities. There is mild left ventricular hypertrophy.  2. Right ventricular systolic function is normal. The right ventricular size is normal.  3. The mitral valve is normal in structure. No evidence of mitral valve regurgitation. No evidence of mitral stenosis.  4. The aortic valve is tricuspid.  5. The inferior vena cava is normal in size with greater than 50% respiratory variability, suggesting right atrial pressure of 3 mmHg. FINDINGS  Left Ventricle: Left ventricular ejection fraction, by estimation, is 60 to 65%. The left ventricle has normal function. The left ventricle has no regional wall motion abnormalities. The left ventricular internal cavity size was normal in size. There is  mild left ventricular hypertrophy. Right Ventricle: The right ventricular size is normal. No increase in right ventricular wall thickness. Right ventricular systolic function is normal. Pericardium: There is no evidence of pericardial effusion. Mitral Valve: The mitral valve is normal in structure. No evidence of mitral valve stenosis. Tricuspid Valve: The tricuspid valve is normal in structure. Tricuspid valve regurgitation is not demonstrated. No evidence of tricuspid stenosis. Aortic Valve: The aortic valve is tricuspid. Pulmonic Valve: The pulmonic valve was not well visualized. Pulmonic valve  regurgitation is not visualized. No evidence of pulmonic stenosis. Aorta: The aortic root is normal in size and structure. Pulmonary Artery: Indeterminant PASP, inadequate TR jet. Venous: The inferior vena cava is normal in size with greater than 50% respiratory variability, suggesting right atrial pressure of 3 mmHg. IAS/Shunts: No atrial level shunt detected by color flow Doppler. LEFT VENTRICLE PLAX 2D LVIDd:         4.20 cm LVIDs:         2.80 cm LV PW:         1.30 cm LV IVS:        1.10 cm LVOT diam:     2.00 cm LV SV:         71 LV SV Index:   31 LVOT Area:     3.14 cm  IVC IVC diam: 1.70 cm LEFT ATRIUM         Index LA diam:    4.30 cm 1.87 cm/m  AORTIC VALVE LVOT Vmax:  116.00 cm/s LVOT Vmean:  76.100 cm/s LVOT VTI:    0.227 m  AORTA Ao Root diam: 3.10 cm MITRAL VALVE MV Area (PHT): 2.91 cm    SHUNTS MV Decel Time: 261 msec    Systemic VTI:  0.23 m MV E velocity: 75.40 cm/s  Systemic Diam: 2.00 cm MV A velocity: 66.00 cm/s MV E/A ratio:  1.14 Carlyle Dolly MD Electronically signed by Carlyle Dolly MD Signature Date/Time: 09/28/2020/2:52:33 PM    Final

## 2020-10-26 NOTE — Progress Notes (Signed)
   10/26/20 1245  Assess: MEWS Score  Temp 98.1 F (36.7 C)  BP (!) 84/70  Pulse Rate (!) 105  ECG Heart Rate (!) 105  Resp (!) 40  Level of Consciousness Alert  SpO2 99 %  Assess: MEWS Score  MEWS Temp 0  MEWS Systolic 1  MEWS Pulse 1  MEWS RR 3  MEWS LOC 0  MEWS Score 5  MEWS Score Color Red  Assess: if the MEWS score is Yellow or Red  Were vital signs taken at a resting state? Yes  Focused Assessment No change from prior assessment  Early Detection of Sepsis Score *See Row Information* Low  MEWS guidelines implemented *See Row Information* Yes  Treat  MEWS Interventions Escalated (See documentation below)  Take Vital Signs  Increase Vital Sign Frequency  Red: Q 1hr X 4 then Q 4hr X 4, if remains red, continue Q 4hrs  Escalate  MEWS: Escalate Red: discuss with charge nurse/RN and provider, consider discussing with RRT  Notify: Charge Nurse/RN  Name of Charge Nurse/RN Notified Lanny Hurst RN  Date Charge Nurse/RN Notified 10/26/20  Time Charge Nurse/RN Notified 1246  Notify: Provider  Provider Name/Title Dr. Sloan Leiter  Date Provider Notified 10/26/20  Time Provider Notified 1251  Notification Type Face-to-face  Document  Patient Outcome Stabilized after interventions  Progress note created (see row info) Yes

## 2020-10-26 NOTE — Progress Notes (Addendum)
   10/25/20 2357  Assess: MEWS Score  Temp 98.3 F (36.8 C)  BP (!) 110/46  Pulse Rate 75  ECG Heart Rate 75  Resp (!) 28  SpO2 98 %  O2 Device HFNC  O2 Flow Rate (L/min) 25 L/min  FiO2 (%) 60 %  Assess: MEWS Score  MEWS Temp 0  MEWS Systolic 0  MEWS Pulse 0  MEWS RR 2  MEWS LOC 0  MEWS Score 2  MEWS Score Color Yellow  Assess: if the MEWS score is Yellow or Red  Were vital signs taken at a resting state? Yes  Focused Assessment No change from prior assessment  Early Detection of Sepsis Score *See Row Information* Low  MEWS guidelines implemented *See Row Information* Yes  Treat  MEWS Interventions Escalated (See documentation below)  Pain Scale 0-10  Pain Score 0  Take Vital Signs  Increase Vital Sign Frequency  Yellow: Q 2hr X 2 then Q 4hr X 2, if remains yellow, continue Q 4hrs  Escalate  MEWS: Escalate Yellow: discuss with charge nurse/RN and consider discussing with provider and RRT  Notify: Charge Nurse/RN  Name of Charge Nurse/RN Notified Niki  Date Charge Nurse/RN Notified 10/25/20  Time Charge Nurse/RN Notified 2878  Document  Patient Outcome Other (Comment)  Progress note created (see row info) Yes    Py turned yellow mews for his RR pt remains stable. Charge nurse notified will continue to monitor.

## 2020-10-27 LAB — COMPREHENSIVE METABOLIC PANEL
ALT: 54 U/L — ABNORMAL HIGH (ref 0–44)
AST: 21 U/L (ref 15–41)
Albumin: 2.3 g/dL — ABNORMAL LOW (ref 3.5–5.0)
Alkaline Phosphatase: 104 U/L (ref 38–126)
Anion gap: 10 (ref 5–15)
BUN: 15 mg/dL (ref 6–20)
CO2: 26 mmol/L (ref 22–32)
Calcium: 8.3 mg/dL — ABNORMAL LOW (ref 8.9–10.3)
Chloride: 101 mmol/L (ref 98–111)
Creatinine, Ser: 0.89 mg/dL (ref 0.61–1.24)
GFR, Estimated: >60 mL/min (ref >60)
Glucose, Bld: 105 mg/dL — ABNORMAL HIGH (ref 70–99)
Potassium: 3.7 mmol/L (ref 3.5–5.1)
Sodium: 137 mmol/L (ref 135–145)
Total Bilirubin: 1 mg/dL (ref 0.3–1.2)
Total Protein: 7.1 g/dL (ref 6.5–8.1)

## 2020-10-27 LAB — CBC
HCT: 38.2 % — ABNORMAL LOW (ref 39.0–52.0)
Hemoglobin: 12.3 g/dL — ABNORMAL LOW (ref 13.0–17.0)
MCH: 30.3 pg (ref 26.0–34.0)
MCHC: 32.2 g/dL (ref 30.0–36.0)
MCV: 94.1 fL (ref 80.0–100.0)
Platelets: 228 10*3/uL (ref 150–400)
RBC: 4.06 MIL/uL — ABNORMAL LOW (ref 4.22–5.81)
RDW: 13.8 % (ref 11.5–15.5)
WBC: 13.9 10*3/uL — ABNORMAL HIGH (ref 4.0–10.5)
nRBC: 0 % (ref 0.0–0.2)

## 2020-10-27 LAB — C-REACTIVE PROTEIN: CRP: 12.4 mg/dL — ABNORMAL HIGH (ref <1.0)

## 2020-10-27 LAB — GLUCOSE, CAPILLARY: Glucose-Capillary: 123 mg/dL — ABNORMAL HIGH (ref 70–99)

## 2020-10-27 LAB — MAGNESIUM: Magnesium: 2.3 mg/dL (ref 1.7–2.4)

## 2020-10-27 MED ORDER — LACTATED RINGERS IV SOLN: INTRAVENOUS | Status: AC

## 2020-10-27 NOTE — Progress Notes (Signed)
PROGRESS NOTE                                                                                                                                                                                                             Patient Demographics:    Patrice Ellers, is a 55 y.o. male, DOB - [birth year], [id number]  Outpatient Primary MD for the patient is Patient, No Pcp Per   Admit date - 09/26/2020   LOS - 31  No chief complaint on file.      Brief Narrative: Patient is a 55 y.o. male with PMHx of  CAD s/p PCI, tobacco use, HLD, GERD-who presented with shortness of breath-found to have acute hypoxic respiratory failure requiring 100% O2 via NRB due to COVID-19 pneumonia.  COVID-19 vaccinated status: Unvaccinated  Significant Events: 12/9>> started having GI symptoms including diarrhea 12/13>> to urgent care-for diarrhea-discharged after supportive care/IV fluids 12/16>> Admit to [facility] for hypoxia due to COVID-19 pneumonia-requiring 100% O2 via NRB 12/28>> hemoptysis-PCCM consulted-supportive care recommended. 12/31>> worsening hypoxemia-started on heated high flow-unable to lie flat for a CTA chest  Significant studies: 12/17>> CTA chest: No PE, diffuse groundglass opacities 12/18>> Echo: EF 60-65% 12/26>> chest x-ray: Diffuse bilateral heterogeneous/interstitial airspace opacities 12/31>> chest x-ray: Stable bilateral lung opacities 12/31>> lower extremity Doppler: No DVT 1/5>> chest x-ray: Mild improvement in multifocal opacities 1/10>> chest x-ray: Diffuse bilateral airspace disease 1/15>> chest x-ray: Persistent diffuse bilateral hazy opacities-similar/slightly improved from prior study   COVID-19 medications: Steroids: 12/16>>1/15 Remdesivir: 12/16>> 12/20 Baricitinib: 12/17>>12/30  Antibiotics: None  Microbiology data: None  Procedures: None  Consults: PCCM  DVT prophylaxis: Lovenox at BID  dosing    Subjective:   Patient in bed, appears comfortable, denies any headache, no fever, no chest pain or pressure, mild shortness of breath , no abdominal pain. No focal weakness.   Assessment  & Plan :   Acute Hypoxic Resp Failure due to Covid 19 Viral pneumonia: He is unvaccinated and unfortunately incurred severe parenchymal lung injury due to COVID-19 pneumonia/ARDS, he has been treated with IV remdesivir, Baricitinib and steroids.  He has finished his remdesivir and Baricitinib treatment, still quite hypoxic requiring 35 L of heated high flow along with as needed nonrebreather mask.  Intermittently dosing steroids and Lasix based on CRP and clinical condition.  Technically he is off of quarantine.  O2 requirements:  SpO2: 95 % O2 Flow Rate (L/min): 35 L/min FiO2 (%): 100 %    Prone/Incentive Spirometry: encouraged chair in daytime and use I-S and flutter valve for pulmonary toiletry and recruitment.    Recent Labs  Lab 10/21/20 0147 10/22/20 0225 10/23/20 0034 10/24/20 0121 10/26/20 0257 10/26/20 0749 10/27/20 0234  WBC 14.1* 14.8* 15.0* 14.0* 13.1*  --  13.9*  HGB 12.7* 12.5* 12.1* 12.1* 11.8*  --  12.3*  HCT 36.2* 36.2* 36.5* 35.5* 36.2*  --  38.2*  PLT 165 181 194 197 219  --  228  CRP 13.3* 12.9* 11.6* 10.1*  --  12.2* 12.4*  DDIMER 1.20* 1.23* 1.22*  --   --   --   --   AST 16 17 20 25 20   --  21  ALT 75* 63* 59* 62* 54*  --  54*  ALKPHOS 89 98 91 101 101  --  104  BILITOT 0.8 1.2 1.0 0.9 0.8  --  1.0  ALBUMIN 2.4* 2.4* 2.4* 2.4* 2.4*  --  2.3*      Mildly elevated D-dimer: Secondary to COVID-19 related inflammation-hypoxia has not worsened-he remains stable-has been on prophylactic Lovenox since admission-CTA chest/lower extremity Dopplers have been negative.  On twice daily dosing of Lovenox.  Acute on chronic diastolic heart failure: No evidence of volume overload-supportive care-keep in negative balance-continue to dose IV Lasix intermittently.  Suspect  I/os inaccurate-Weight down to 208 pounds (250 pounds on admission)!!  Hemoptysis: Isolated episode that occurred on 12/28-likely related to underlying parenchymal injury from COVID-19 pneumonia.  No further episodes since then.  Continue to monitor closely-if reoccurs-we will reconsult PCCM.  Hypotension.  Gentle IV fluids on 10/27/2020 and monitor.  Transaminitis: Due to COVID-19-downtrending-follow periodically  CAD s/p PCI: No anginal symptoms-not on any antiplatelets at home.  Per patient report-PCI was approximately 11 years back  GERD: Continue PPI  Anxiety: Continues to have significant amount of anxiety-remains on Lexapro/Klonopin-dosage of Lexapro has been gradually increased to 20 mg daily-on 1 mg of Klonopin 3 times daily as needed.  Constipation-continue MiraLAX  Obesity: Estimated body mass index is 28.44 kg/m as calculated from the following:   Height as of this encounter: 5\' 11"  (1.803 m).   Weight as of this encounter: 92.5 kg.    GI prophylaxis: PPI   Condition - Guarded  Family Communication  :  Wyley Yung [phone] 1/15  Code Status :  Full Code  Diet :  Diet Order            Diet Heart Room service appropriate? No; Fluid consistency: Thin; Fluid restriction: 2000 mL Fluid  Diet effective now                  Disposition Plan  :   Status is: Inpatient  Remains inpatient appropriate because:Inpatient level of care appropriate due to severity of illness   Dispo: The patient is from: Home              Anticipated d/c is to: Home              Anticipated d/c date is: > 3 days              Patient currently is not medically stable to d/c.   Barriers to discharge: Hypoxia requiring O2 supplementation  Antimicorbials  :    Anti-infectives (From admission, onward)   Start     Dose/Rate Route Frequency Ordered Stop   09/27/20 1000  remdesivir 100 mg in sodium chloride 0.9 % 100 mL IVPB       "Followed by" Linked Group Details    100 mg 200 mL/hr over 30 Minutes Intravenous Daily 09/26/20 2208 09/30/20 1900   09/26/20 2215  remdesivir 200 mg in sodium chloride 0.9% 250 mL IVPB       "Followed by" Linked Group Details   200 mg 580 mL/hr over 30 Minutes Intravenous Once 09/26/20 2208 09/27/20 0146      Inpatient Medications  Scheduled Meds: . (feeding supplement) PROSource Plus  30 mL Oral BID BM  . benzonatate  200 mg Oral TID  . enoxaparin (LOVENOX) injection  50 mg Subcutaneous Q12H  . escitalopram  20 mg Oral Daily  . feeding supplement  1 Container Oral BID BM  . melatonin  3 mg Oral QHS  . multivitamins with iron  1 tablet Oral Daily  . pantoprazole  40 mg Oral Daily  . polyethylene glycol  17 g Oral Daily  . sodium chloride flush  3 mL Intravenous Q12H   Continuous Infusions: . lactated ringers     PRN Meds:.acetaminophen, albuterol, bisacodyl, clonazePAM, guaiFENesin-dextromethorphan, menthol-cetylpyridinium, ondansetron (ZOFRAN) IV, senna-docusate, sodium chloride   Time Spent in minutes  25   See all Orders from today for further details   Lala Lund M.D on 10/27/2020 at 11:11 AM  To page go to [email or web address] - use universal password  [facility] -  Office  [phone]    Objective:   Vitals:   10/27/20 0045 10/27/20 0445 10/27/20 0821 10/27/20 0840  BP: 112/73 102/70  (!) 84/65  Pulse: 69 74  89  Resp: 18 18  20   Temp: (!) 96.3 F (35.7 C) (!) 96.9 F (36.1 C)  97.8 F (36.6 C)  TempSrc: Axillary Axillary  Oral  SpO2: 95% 97% 98% 95%  Weight:  92.5 kg    Height:        Wt Readings from Last 3 Encounters:  10/27/20 92.5 kg  12/04/19 108.4 kg  11/20/19 107.9 kg     Intake/Output Summary (Last 24 hours) at 10/27/2020 1111 Last data filed at 10/27/2020 0540 Gross per 24 hour  Intake 200 ml  Output 350 ml  Net -150 ml     Physical Exam  Awake Alert, No new F.N deficits [place].AT,PERRAL Supple Neck,No JVD, No cervical lymphadenopathy appriciated.   Symmetrical Chest wall movement, Good air movement bilaterally, CTAB RRR,No Gallops, Rubs or new Murmurs, No Parasternal Heave +ve B.Sounds, Abd Soft, No tenderness, No organomegaly appriciated, No rebound - guarding or rigidity. No Cyanosis, Clubbing or edema, No new Rash or bruise    Data Review:    CBC Recent Labs  Lab 10/22/20 0225 10/23/20 0034 10/24/20 0121 10/26/20 0257 10/27/20 0234  WBC 14.8* 15.0* 14.0* 13.1* 13.9*  HGB 12.5* 12.1* 12.1* 11.8* 12.3*  HCT 36.2* 36.5* 35.5* 36.2* 38.2*  PLT 181 194 197 219 228  MCV 91.2 92.4 91.5 94.0 94.1  MCH 31.5 30.6 31.2 30.6 30.3  MCHC 34.5 33.2 34.1 32.6 32.2  RDW 13.4 13.3 13.6 13.7 13.8    Chemistries  Recent Labs  Lab 10/22/20 0225 10/23/20 0034 10/24/20 0121 10/26/20 0257 10/27/20 0234  NA 136 137 136 137 137  K 3.7 3.4* 3.7 3.3* 3.7  CL 100 100 99 100 101  CO2 25 25 26 26 26   GLUCOSE 104* 121* 135* 92 105*  BUN 23* 23* 19 17 15   CREATININE 0.92 0.90 0.95 0.92 0.89  CALCIUM 8.5*  8.4* 8.5* 8.5* 8.3*  MG  --   --   --   --  2.3  AST 17 20 25 20 21   ALT 63* 59* 62* 54* 54*  ALKPHOS 98 91 101 101 104  BILITOT 1.2 1.0 0.9 0.8 1.0   ------------------------------------------------------------------------------------------------------------------ No results for input(s): CHOL, HDL, LDLCALC, TRIG, CHOLHDL, LDLDIRECT in the last 72 hours.  No results found for: HGBA1C ------------------------------------------------------------------------------------------------------------------ No results for input(s): TSH, T4TOTAL, T3FREE, THYROIDAB in the last 72 hours.  Invalid input(s): FREET3 ------------------------------------------------------------------------------------------------------------------ No results for input(s): VITAMINB12, FOLATE, FERRITIN, TIBC, IRON, RETICCTPCT in the last 72 hours.  Coagulation profile No results for input(s): INR, PROTIME in the last 168 hours.  No results for input(s): DDIMER  in the last 72 hours.  Cardiac Enzymes No results for input(s): CKMB, TROPONINI, MYOGLOBIN in the last 168 hours.  Invalid input(s): CK ------------------------------------------------------------------------------------------------------------------    Component Value Date/Time   BNP 53.8 10/11/2020 1024    Micro Results No results found for this or any previous visit (from the past 240 hour(s)).  Radiology Reports CT ANGIO CHEST PE W OR WO CONTRAST  Result Date: 09/27/2020 CLINICAL DATA:  55 year old male with history of COVID-19, suspected pulmonary embolism. EXAM: CT ANGIOGRAPHY CHEST WITH CONTRAST TECHNIQUE: Multidetector CT imaging of the chest was performed using the standard protocol during bolus administration of intravenous contrast. Multiplanar CT image reconstructions and MIPs were obtained to evaluate the vascular anatomy. CONTRAST:  100 mL Omnipaque 350, intravenous COMPARISON:  Chest CT from 11/17/2019 FINDINGS: Cardiovascular: Satisfactory opacification of the pulmonary arteries to the segmental level. No evidence of pulmonary embolism. Normal heart size. No pericardial effusion. The heart is normal in size. No pericardial effusion. Severe coronary atherosclerotic calcifications. Mediastinum/Nodes: Similar appearing prominent right paratracheal lymph node measuring up to 1.2 cm in short axis. Otherwise no enlarged mediastinal, hilar, or axillary lymph nodes. Thyroid gland, trachea, and esophagus demonstrate no significant findings. Lungs/Pleura: Interval development of near diffuse, peripherally predominant ground-glass pulmonary opacities with a mid lung and basal predominance. Similar appearing moderate upper lobe predominant paraseptal and centrilobular emphysema. No pleural effusion or pneumothorax. Upper Abdomen: The visualized upper abdomen is within normal limits. Musculoskeletal: No chest wall abnormality. No acute or significant osseous findings. Review of the MIP images  confirms the above findings. IMPRESSION: Vascular: 1. No evidence of pulmonary embolism. 2. Severe coronary atherosclerotic calcifications. Non-Vascular: 1. Diffuse, mid lung and basilar, peripherally predominant ground-glass opacities - findings compatible with multifocal pneumonia associated with COVID-19. 2. Similar appearing moderate upper lobe predominant centrilobular and paraseptal emphysema. Electronically Signed   By: Ruthann Cancer MD   On: 09/27/2020 14:31   DG Chest Port 1 View  Result Date: 10/26/2020 CLINICAL DATA:  COVID pneumonia EXAM: PORTABLE CHEST 1 VIEW COMPARISON:  October 21, 2020 FINDINGS: The heart size is stable. Diffuse bilateral hazy airspace opacities are again noted. These appeared to be nearly stable from prior study if not slightly improved. There is no definite pneumothorax. There are probable small bilateral pleural effusions. There is no acute osseous abnormality. IMPRESSION: Persistent diffuse bilateral hazy airspace opacities, similar to slightly improved from prior study. Electronically Signed   By: Constance Holster M.D.   On: 10/26/2020 06:11   DG Chest Port 1 View  Result Date: 10/21/2020 CLINICAL DATA:  Short of breath.  COVID positive EXAM: PORTABLE CHEST 1 VIEW COMPARISON:  None. FINDINGS: Low lung volumes. normal cardiac silhouette. There is bilateral diffuse fine airspace disease. No pleural fluid. No pneumothorax. IMPRESSION: Bilateral  diffuse airspace disease consistent with viral pneumonia. Electronically Signed   By: Suzy Bouchard M.D.   On: 10/21/2020 08:17   DG Chest Port 1 View  Result Date: 10/06/2020 CLINICAL DATA:  COVID positive, pleural effusion, pneumothorax EXAM: PORTABLE CHEST 1 VIEW COMPARISON:  09/26/2020 FINDINGS: Slight interval increase in diffuse bilateral heterogeneous and interstitial airspace opacity, most conspicuous at the lung bases. No significant pleural effusion noted. No pneumothorax. The heart and mediastinum are  unremarkable. IMPRESSION: 1. Slight interval increase in diffuse bilateral heterogeneous and interstitial airspace opacity, most conspicuous at the lung bases, consistent with worsened COVID airspace disease. 2.  No significant pleural effusion noted. No pneumothorax. Electronically Signed   By: Eddie Candle M.D.   On: 10/06/2020 09:37   DG Chest Port 1V same Day  Result Date: 10/16/2020 CLINICAL DATA:  COVID positive, short of breath EXAM: PORTABLE CHEST 1 VIEW COMPARISON:  None. FINDINGS: Normal cardiac silhouette. Bilateral peripheral fine airspace disease appears slightly less dense than comparison exam. No pneumothorax. No focal consolidation. IMPRESSION: Mild improvement in bilateral multifocal viral pneumonia. Electronically Signed   By: Suzy Bouchard M.D.   On: 10/16/2020 13:32   DG Chest Port 1V same Day  Result Date: 10/13/2020 CLINICAL DATA:  55 year old male with respiratory failure. Recent bronchoscopy and hemoptysis. History of COVID-19. EXAM: PORTABLE CHEST 1 VIEW COMPARISON:  Portable chest 10/11/2020 and earlier. FINDINGS: Portable AP semi upright view at 0554 hours. Lower lung volumes. Stable cardiac size and mediastinal contours. Visualized tracheal air column is within normal limits. Diffuse reticulonodular pulmonary opacity with confluent density in the peripheral right lung and at the lung bases now worse on the right. No pneumothorax. No definite pleural effusion. Stable visualized osseous structures. IMPRESSION: Continued diffuse reticulonodular opacity with lower lung volumes and worsening ventilation since 10/11/2020 at both lung bases. Electronically Signed   By: Genevie Ann M.D.   On: 10/13/2020 07:57   DG Chest Port 1V same Day  Result Date: 10/11/2020 CLINICAL DATA:  Hypoxia.  Shortness of breath. EXAM: PORTABLE CHEST 1 VIEW COMPARISON:  October 06, 2020. FINDINGS: The heart size and mediastinal contours are within normal limits. No pneumothorax or pleural effusion is noted.  Stable bilateral lung opacities are noted concerning for multifocal pneumonia. The visualized skeletal structures are unremarkable. IMPRESSION: Stable bilateral lung opacities are noted concerning for multifocal pneumonia. Electronically Signed   By: Marijo Conception M.D.   On: 10/11/2020 10:06   VAS [place] LOWER EXTREMITY VENOUS (DVT)  Result Date: 10/12/2020  Lower Venous DVT Study Indications: Worsening hypoxia, Covid-19.  Comparison Study: No prior studies. Performing Technologist: Darlin Coco, RDMS  Examination Guidelines: A complete evaluation includes B-mode imaging, spectral Doppler, color Doppler, and power Doppler as needed of all accessible portions of each vessel. Bilateral testing is considered an integral part of a complete examination. Limited examinations for reoccurring indications may be performed as noted. The reflux portion of the exam is performed with the patient in reverse Trendelenburg.  +---------+---------------+---------+-----------+----------+--------------+ RIGHT    CompressibilityPhasicitySpontaneityPropertiesThrombus Aging +---------+---------------+---------+-----------+----------+--------------+ CFV      Full           Yes      Yes                                 +---------+---------------+---------+-----------+----------+--------------+ SFJ      Full                                                        +---------+---------------+---------+-----------+----------+--------------+  FV Prox  Full                                                        +---------+---------------+---------+-----------+----------+--------------+ FV Mid   Full                                                        +---------+---------------+---------+-----------+----------+--------------+ FV DistalFull                                                        +---------+---------------+---------+-----------+----------+--------------+ PFV      Full                                                         +---------+---------------+---------+-----------+----------+--------------+ POP      Full           Yes      Yes                                 +---------+---------------+---------+-----------+----------+--------------+ PTV      Full                                                        +---------+---------------+---------+-----------+----------+--------------+ PERO     Full                                                        +---------+---------------+---------+-----------+----------+--------------+   +---------+---------------+---------+-----------+----------+--------------+ LEFT     CompressibilityPhasicitySpontaneityPropertiesThrombus Aging +---------+---------------+---------+-----------+----------+--------------+ CFV      Full           Yes      Yes                                 +---------+---------------+---------+-----------+----------+--------------+ SFJ      Full                                                        +---------+---------------+---------+-----------+----------+--------------+ FV Prox  Full                                                        +---------+---------------+---------+-----------+----------+--------------+  FV Mid   Full                                                        +---------+---------------+---------+-----------+----------+--------------+ FV DistalFull                                                        +---------+---------------+---------+-----------+----------+--------------+ PFV      Full                                                        +---------+---------------+---------+-----------+----------+--------------+ POP      Full           Yes      Yes                                 +---------+---------------+---------+-----------+----------+--------------+ PTV      Full                                                         +---------+---------------+---------+-----------+----------+--------------+ PERO     Full                                                        +---------+---------------+---------+-----------+----------+--------------+     Summary: RIGHT: - There is no evidence of deep vein thrombosis in the lower extremity.  - No cystic structure found in the popliteal fossa.  LEFT: - There is no evidence of deep vein thrombosis in the lower extremity.  - No cystic structure found in the popliteal fossa.  *See table(s) above for measurements and observations. Electronically signed by Ruta Hinds MD on 10/12/2020 at 10:00:37 AM.    Final    ECHOCARDIOGRAM LIMITED  Result Date: 09/28/2020    ECHOCARDIOGRAM LIMITED REPORT   Patient Name:   Detrich Loletha Grayer Ecklund Date of Exam: 09/28/2020 Medical Rec #:  [id number]          Height:       71.0 in Accession #:    [id number]         Weight:       244.3 lb Date of Birth:  06/23/1966          BSA:          2.296 m Patient Age:    4 years           BP:           120/84 mmHg Patient Gender: M                  HR:  73 bpm. Exam Location:  Inpatient Procedure: Limited Echo, Limited Color Doppler and Cardiac Doppler Indications:    acute respiratory distress  History:        Patient has prior history of Echocardiogram examinations, most                 recent 03/30/2016. CAD; Covid.  Sonographer:    Johny Chess Referring Phys: [id number] Candace Gallus MELVIN  Sonographer Comments: Image acquisition challenging due to respiratory motion. IMPRESSIONS  1. Left ventricular ejection fraction, by estimation, is 60 to 65%. The left ventricle has normal function. The left ventricle has no regional wall motion abnormalities. There is mild left ventricular hypertrophy.  2. Right ventricular systolic function is normal. The right ventricular size is normal.  3. The mitral valve is normal in structure. No evidence of mitral valve regurgitation. No evidence of mitral stenosis.  4. The  aortic valve is tricuspid.  5. The inferior vena cava is normal in size with greater than 50% respiratory variability, suggesting right atrial pressure of 3 mmHg. FINDINGS  Left Ventricle: Left ventricular ejection fraction, by estimation, is 60 to 65%. The left ventricle has normal function. The left ventricle has no regional wall motion abnormalities. The left ventricular internal cavity size was normal in size. There is  mild left ventricular hypertrophy. Right Ventricle: The right ventricular size is normal. No increase in right ventricular wall thickness. Right ventricular systolic function is normal. Pericardium: There is no evidence of pericardial effusion. Mitral Valve: The mitral valve is normal in structure. No evidence of mitral valve stenosis. Tricuspid Valve: The tricuspid valve is normal in structure. Tricuspid valve regurgitation is not demonstrated. No evidence of tricuspid stenosis. Aortic Valve: The aortic valve is tricuspid. Pulmonic Valve: The pulmonic valve was not well visualized. Pulmonic valve regurgitation is not visualized. No evidence of pulmonic stenosis. Aorta: The aortic root is normal in size and structure. Pulmonary Artery: Indeterminant PASP, inadequate TR jet. Venous: The inferior vena cava is normal in size with greater than 50% respiratory variability, suggesting right atrial pressure of 3 mmHg. IAS/Shunts: No atrial level shunt detected by color flow Doppler. LEFT VENTRICLE PLAX 2D LVIDd:         4.20 cm LVIDs:         2.80 cm LV PW:         1.30 cm LV IVS:        1.10 cm LVOT diam:     2.00 cm LV SV:         71 LV SV Index:   31 LVOT Area:     3.14 cm  IVC IVC diam: 1.70 cm LEFT ATRIUM         Index LA diam:    4.30 cm 1.87 cm/m  AORTIC VALVE LVOT Vmax:   116.00 cm/s LVOT Vmean:  76.100 cm/s LVOT VTI:    0.227 m  AORTA Ao Root diam: 3.10 cm MITRAL VALVE MV Area (PHT): 2.91 cm    SHUNTS MV Decel Time: 261 msec    Systemic VTI:  0.23 m MV E velocity: 75.40 cm/s  Systemic Diam:  2.00 cm MV A velocity: 66.00 cm/s MV E/A ratio:  1.14 Carlyle Dolly MD Electronically signed by Carlyle Dolly MD Signature Date/Time: 09/28/2020/2:52:33 PM    Final

## 2020-10-27 NOTE — Progress Notes (Signed)
   10/27/20 2128  Assess: MEWS Score  Temp 98.4 F (36.9 C)  BP (!) 93/57  Pulse Rate 80  ECG Heart Rate 78  Resp (!) 35  Level of Consciousness Alert  SpO2 96 %  O2 Device HFNC  O2 Flow Rate (L/min) 30 L/min  FiO2 (%) 50 %  Assess: MEWS Score  MEWS Temp 0  MEWS Systolic 1  MEWS Pulse 0  MEWS RR 2  MEWS LOC 0  MEWS Score 3  MEWS Score Color Yellow  Assess: if the MEWS score is Yellow or Red  Were vital signs taken at a resting state? Yes  Focused Assessment No change from prior assessment  Early Detection of Sepsis Score *See Row Information* Low  MEWS guidelines implemented *See Row Information* Yes  Treat  MEWS Interventions Administered scheduled meds/treatments  Pain Scale 0-10  Pain Score 0  Take Vital Signs  Increase Vital Sign Frequency  Yellow: Q 2hr X 2 then Q 4hr X 2, if remains yellow, continue Q 4hrs  Escalate  MEWS: Escalate Yellow: discuss with charge nurse/RN and consider discussing with provider and RRT  Notify: Charge Nurse/RN  Name of Charge Nurse/RN Notified Elmyra Ricks  Date Charge Nurse/RN Notified 10/27/20  Time Charge Nurse/RN Notified 2130  Document  Patient Outcome Other (Comment) (tachypnea still present)  Progress note created (see row info) Yes

## 2020-10-28 ENCOUNTER — Inpatient Hospital Stay (HOSPITAL_COMMUNITY): Payer: HRSA Program

## 2020-10-28 LAB — CBC WITH DIFFERENTIAL/PLATELET
Abs Immature Granulocytes: 0.53 10*3/uL — ABNORMAL HIGH (ref 0.00–0.07)
Basophils Absolute: 0.1 10*3/uL (ref 0.0–0.1)
Basophils Relative: 1 %
Eosinophils Absolute: 0.2 10*3/uL (ref 0.0–0.5)
Eosinophils Relative: 2 %
HCT: 35.4 % — ABNORMAL LOW (ref 39.0–52.0)
Hemoglobin: 12.2 g/dL — ABNORMAL LOW (ref 13.0–17.0)
Immature Granulocytes: 4 %
Lymphocytes Relative: 12 %
Lymphs Abs: 1.5 10*3/uL (ref 0.7–4.0)
MCH: 31.8 pg (ref 26.0–34.0)
MCHC: 34.5 g/dL (ref 30.0–36.0)
MCV: 92.2 fL (ref 80.0–100.0)
Monocytes Absolute: 0.6 10*3/uL (ref 0.1–1.0)
Monocytes Relative: 5 %
Neutro Abs: 10.3 10*3/uL — ABNORMAL HIGH (ref 1.7–7.7)
Neutrophils Relative %: 76 %
Platelets: 219 10*3/uL (ref 150–400)
RBC: 3.84 MIL/uL — ABNORMAL LOW (ref 4.22–5.81)
RDW: 13.9 % (ref 11.5–15.5)
WBC: 13.3 10*3/uL — ABNORMAL HIGH (ref 4.0–10.5)
nRBC: 0 % (ref 0.0–0.2)

## 2020-10-28 LAB — COMPREHENSIVE METABOLIC PANEL
ALT: 48 U/L — ABNORMAL HIGH (ref 0–44)
AST: 20 U/L (ref 15–41)
Albumin: 2.3 g/dL — ABNORMAL LOW (ref 3.5–5.0)
Alkaline Phosphatase: 101 U/L (ref 38–126)
Anion gap: 12 (ref 5–15)
BUN: 14 mg/dL (ref 6–20)
CO2: 24 mmol/L (ref 22–32)
Calcium: 8.5 mg/dL — ABNORMAL LOW (ref 8.9–10.3)
Chloride: 100 mmol/L (ref 98–111)
Creatinine, Ser: 0.84 mg/dL (ref 0.61–1.24)
GFR, Estimated: >60 mL/min (ref >60)
Glucose, Bld: 126 mg/dL — ABNORMAL HIGH (ref 70–99)
Potassium: 3.5 mmol/L (ref 3.5–5.1)
Sodium: 136 mmol/L (ref 135–145)
Total Bilirubin: 1 mg/dL (ref 0.3–1.2)
Total Protein: 7 g/dL (ref 6.5–8.1)

## 2020-10-28 LAB — D-DIMER, QUANTITATIVE: D-Dimer, Quant: 1.71 ug/mL-FEU — ABNORMAL HIGH (ref 0.00–0.50)

## 2020-10-28 LAB — MAGNESIUM: Magnesium: 2 mg/dL (ref 1.7–2.4)

## 2020-10-28 LAB — BRAIN NATRIURETIC PEPTIDE: B Natriuretic Peptide: 52.4 pg/mL (ref 0.0–100.0)

## 2020-10-28 LAB — C-REACTIVE PROTEIN: CRP: 12.4 mg/dL — ABNORMAL HIGH (ref <1.0)

## 2020-10-28 MED ORDER — LACTATED RINGERS IV SOLN: INTRAVENOUS | Status: AC

## 2020-10-28 NOTE — Progress Notes (Signed)
HOSPITAL MEDICINE OVERNIGHT EVENT NOTE    Notified by nursing that patient has developed a yellow MEWS score.  Patient is exhibiting low blood pressures throughout the majority of the evening shift range between 00Q and 67Y systolic.    Chart reviewed, patient is suffering from severe COVID-19 pneumonia with prolonged hospitalization.    Patient is awake alert and oriented x3.  While patient is tachypneic this is not a dramatic change from prior days and he is breathing relatively comfortably despite his tachypnea.  Oxygen requirements have not increased as of late.  Review of vital signs over the past 5 days reveals that patient frequently does have blood pressure that dip into the 19J to 09T systolic at times.  Will continue close clinical monitoring at this time.  We will consider administering additional IV boluses of fluid if maps drop below 65.  Vernelle Emerald  MD [facility]

## 2020-10-28 NOTE — Progress Notes (Signed)
VSS. Patient in no distress. Pt now in green MEWs. Will continue to check VS Q4. Charge Nurse Vicente Males made aware.

## 2020-10-28 NOTE — Progress Notes (Deleted)
OT Cancellation Note  Patient Details Name: Matthew [name] MRN: [id number] DOB: [birth year]   Cancelled Treatment:    Reason Eval/Treat Not Completed: Other (comment); pt refusing participation with therapies, including attempts for EOB/OOB. Continues to decline despite max education/encouragement (>10 min attempt). Will follow up as able.  Lou Cal, OT Acute Rehabilitation Services Pager [phone] Office [phone]   Raymondo Band 10/28/2020, 2:45 PM

## 2020-10-28 NOTE — Progress Notes (Addendum)
PROGRESS NOTE                                                                                                                                                                                                             Patient Demographics:    Matthew [name], is a 55 y.o. male, DOB - 11-Jan-1966, SR:[id number]  Outpatient Primary MD for the patient is Patient, No Pcp Per   Admit date - 09/26/2020   LOS - 32  No chief complaint on file.      Brief Narrative: Patient is a 55 y.o. male with PMHx of  CAD s/p PCI, tobacco use, HLD, GERD-who presented with shortness of breath-found to have acute hypoxic respiratory failure requiring 100% O2 via NRB due to COVID-19 pneumonia.  COVID-19 vaccinated status: Unvaccinated  Significant Events: 12/9>> started having GI symptoms including diarrhea 12/13>> to urgent care-for diarrhea-discharged after supportive care/IV fluids 12/16>> Admit to [facility] for hypoxia due to COVID-19 pneumonia-requiring 100% O2 via NRB 12/28>> hemoptysis-PCCM consulted-supportive care recommended. 12/31>> worsening hypoxemia-started on heated high flow-unable to lie flat for a CTA chest  Significant studies: 12/17>> CTA chest: No PE, diffuse groundglass opacities 12/18>> Echo: EF 60-65% 12/26>> chest x-ray: Diffuse bilateral heterogeneous/interstitial airspace opacities 12/31>> chest x-ray: Stable bilateral lung opacities 12/31>> lower extremity Doppler: No DVT 1/5>> chest x-ray: Mild improvement in multifocal opacities 1/10>> chest x-ray: Diffuse bilateral airspace disease 1/15>> chest x-ray: Persistent diffuse bilateral hazy opacities-similar/slightly improved from prior study   COVID-19 medications: Steroids: 12/16>>1/15 Remdesivir: 12/16>> 12/20 Baricitinib: 12/17>>12/30  Antibiotics: None  Microbiology data: None  Procedures: None  Consults: PCCM  DVT prophylaxis: Lovenox at BID  dosing    Subjective:   Patient in bed, appears comfortable, denies any headache, no fever, no chest pain or pressure, mild shortness of breath , no abdominal pain. No focal weakness.   Assessment  & Plan :   Acute Hypoxic Resp Failure due to Covid 19 Viral pneumonia: He is unvaccinated and unfortunately incurred severe parenchymal lung injury due to COVID-19 pneumonia/ARDS, he has been treated with IV remdesivir, Baricitinib and steroids.  He has finished his Remdesivir and Baricitinib treatment, still quite hypoxic requiring 35 L of heated high flow with 80% FiO2, he is off of NRB on these settings at least at rest.  On activity PRN nonrebreather mask.  Intermittently dosing steroids  and Lasix based on CRP and clinical condition.  Technically he is off of quarantine.  O2 requirements:  SpO2: 92 % O2 Flow Rate (L/min): 35 L/min FiO2 (%): 80 %    Prone/Incentive Spirometry: encouraged chair in daytime and use I-S and flutter valve for pulmonary toiletry and recruitment.    Recent Labs  Lab 10/22/20 0225 10/23/20 0034 10/24/20 0121 10/26/20 0257 10/26/20 0749 10/27/20 0234 10/28/20 0103  WBC 14.8* 15.0* 14.0* 13.1*  --  13.9* 13.3*  HGB 12.5* 12.1* 12.1* 11.8*  --  12.3* 12.2*  HCT 36.2* 36.5* 35.5* 36.2*  --  38.2* 35.4*  PLT 181 194 197 219  --  228 219  CRP 12.9* 11.6* 10.1*  --  12.2* 12.4* 12.4*  BNP  --   --   --   --   --   --  52.4  DDIMER 1.23* 1.22*  --   --   --   --  1.71*  AST 17 20 25 20   --  21 20  ALT 63* 59* 62* 54*  --  54* 48*  ALKPHOS 98 91 101 101  --  104 101  BILITOT 1.2 1.0 0.9 0.8  --  1.0 1.0  ALBUMIN 2.4* 2.4* 2.4* 2.4*  --  2.3* 2.3*      Mildly elevated D-dimer: Secondary to COVID-19 related inflammation-hypoxia has not worsened-he remains stable-has been on prophylactic Lovenox since admission-CTA chest/lower extremity Dopplers have been negative.  On twice daily dosing of Lovenox.  Acute on chronic diastolic heart failure: No evidence of  volume overload-supportive care-keep in negative balance-continue to dose IV Lasix intermittently.  Suspect I/os inaccurate - Weight down to 208 pounds (250 pounds on admission)!!  Hemoptysis: Isolated episode that occurred on 12/28-likely related to underlying parenchymal injury from COVID-19 pneumonia.  No further episodes since then.  Continue to monitor closely-if reoccurs-we will reconsult PCCM.  Hypotension.  Gentle IV fluids on 10/27/2020 and repeat again on 10/28/2020.  Transaminitis: Due to COVID-19-downtrending-follow periodically.  CAD s/p PCI: No anginal symptoms-not on any antiplatelets at home.  Per patient report-PCI was approximately 11 years back.  GERD: Continue PPI.  Anxiety: Continues to have significant amount of anxiety-remains on Lexapro/Klonopin-dosage of Lexapro has been gradually increased to 20 mg daily-on 1 mg of Klonopin 3 times daily as needed.  Constipation-continue MiraLAX    GI prophylaxis: PPI   Condition - Guarded  Family Communication  :  Sherlyn Lees Delancy [phone] 10/26/20,1/10/18/20 ( > 10 mins)   Code Status :  Full Code  Diet :  Diet Order            Diet Heart Room service appropriate? No; Fluid consistency: Thin; Fluid restriction: 2000 mL Fluid  Diet effective now                  Disposition Plan  :   Status is: Inpatient  Remains inpatient appropriate because:Inpatient level of care appropriate due to severity of illness   Dispo: The patient is from: Home              Anticipated d/c is to: Home              Anticipated d/c date is: > 3 days              Patient currently is not medically stable to d/c.   Barriers to discharge: Hypoxia requiring O2 supplementation  Antimicorbials  :    Anti-infectives (From admission, onward)   Start  Dose/Rate Route Frequency Ordered Stop   09/27/20 1000  remdesivir 100 mg in sodium chloride 0.9 % 100 mL IVPB       "Followed by" Linked Group Details   100 mg 200 mL/hr  over 30 Minutes Intravenous Daily 09/26/20 2208 09/30/20 1900   09/26/20 2215  remdesivir 200 mg in sodium chloride 0.9% 250 mL IVPB       "Followed by" Linked Group Details   200 mg 580 mL/hr over 30 Minutes Intravenous Once 09/26/20 2208 09/27/20 0146      Inpatient Medications  Scheduled Meds: . (feeding supplement) PROSource Plus  30 mL Oral BID BM  . benzonatate  200 mg Oral TID  . enoxaparin (LOVENOX) injection  50 mg Subcutaneous Q12H  . escitalopram  20 mg Oral Daily  . feeding supplement  1 Container Oral BID BM  . melatonin  3 mg Oral QHS  . multivitamins with iron  1 tablet Oral Daily  . pantoprazole  40 mg Oral Daily  . polyethylene glycol  17 g Oral Daily  . sodium chloride flush  3 mL Intravenous Q12H   Continuous Infusions: . lactated ringers 125 mL/hr at 10/28/20 1018   PRN Meds:.acetaminophen, albuterol, bisacodyl, clonazePAM, guaiFENesin-dextromethorphan, menthol-cetylpyridinium, ondansetron (ZOFRAN) IV, senna-docusate, sodium chloride   Time Spent in minutes  25   See all Orders from today for further details   Lala Lund M.D on 10/28/2020 at 11:17 AM  To page go to [email or web address] - use universal password  [facility] -  Office  [phone]    Objective:   Vitals:   10/28/20 0353 10/28/20 0420 10/28/20 0720 10/28/20 1103  BP:  95/73  92/69  Pulse: 94 86  95  Resp: (!) 44 (!) 34  (!) 24  Temp:  97.8 F (36.6 C)  97.7 F (36.5 C)  TempSrc:  Axillary  Axillary  SpO2: 96% 95% 100% 92%  Weight:      Height:        Wt Readings from Last 3 Encounters:  10/28/20 93.5 kg  12/04/19 108.4 kg  11/20/19 107.9 kg     Intake/Output Summary (Last 24 hours) at 10/28/2020 1117 Last data filed at 10/27/2020 1846 Gross per 24 hour  Intake 418.16 ml  Output --  Net 418.16 ml     Physical Exam  Awake Alert, No new F.N deficits, Normal affect [place].AT,PERRAL Supple Neck,No JVD, No cervical lymphadenopathy appriciated.  Symmetrical Chest  wall movement, Good air movement bilaterally, CTAB RRR,No Gallops, Rubs or new Murmurs, No Parasternal Heave +ve B.Sounds, Abd Soft, No tenderness, No organomegaly appriciated, No rebound - guarding or rigidity. No Cyanosis, Clubbing or edema, No new Rash or bruise    Data Review:    CBC Recent Labs  Lab 10/23/20 0034 10/24/20 0121 10/26/20 0257 10/27/20 0234 10/28/20 0103  WBC 15.0* 14.0* 13.1* 13.9* 13.3*  HGB 12.1* 12.1* 11.8* 12.3* 12.2*  HCT 36.5* 35.5* 36.2* 38.2* 35.4*  PLT 194 197 219 228 219  MCV 92.4 91.5 94.0 94.1 92.2  MCH 30.6 31.2 30.6 30.3 31.8  MCHC 33.2 34.1 32.6 32.2 34.5  RDW 13.3 13.6 13.7 13.8 13.9  LYMPHSABS  --   --   --   --  1.5  MONOABS  --   --   --   --  0.6  EOSABS  --   --   --   --  0.2  BASOSABS  --   --   --   --  0.1  Chemistries  Recent Labs  Lab 10/23/20 0034 10/24/20 0121 10/26/20 0257 10/27/20 0234 10/28/20 0103  NA 137 136 137 137 136  K 3.4* 3.7 3.3* 3.7 3.5  CL 100 99 100 101 100  CO2 25 26 26 26 24   GLUCOSE 121* 135* 92 105* 126*  BUN 23* 19 17 15 14   CREATININE 0.90 0.95 0.92 0.89 0.84  CALCIUM 8.4* 8.5* 8.5* 8.3* 8.5*  MG  --   --   --  2.3 2.0  AST 20 25 20 21 20   ALT 59* 62* 54* 54* 48*  ALKPHOS 91 101 101 104 101  BILITOT 1.0 0.9 0.8 1.0 1.0   ------------------------------------------------------------------------------------------------------------------ No results for input(s): CHOL, HDL, LDLCALC, TRIG, CHOLHDL, LDLDIRECT in the last 72 hours.  No results found for: HGBA1C ------------------------------------------------------------------------------------------------------------------ No results for input(s): TSH, T4TOTAL, T3FREE, THYROIDAB in the last 72 hours.  Invalid input(s): FREET3 ------------------------------------------------------------------------------------------------------------------ No results for input(s): VITAMINB12, FOLATE, FERRITIN, TIBC, IRON, RETICCTPCT in the last 72  hours.  Coagulation profile No results for input(s): INR, PROTIME in the last 168 hours.  Recent Labs    10/28/20 0103  DDIMER 1.71*    Cardiac Enzymes No results for input(s): CKMB, TROPONINI, MYOGLOBIN in the last 168 hours.  Invalid input(s): CK ------------------------------------------------------------------------------------------------------------------    Component Value Date/Time   BNP 52.4 10/28/2020 0103    Micro Results No results found for this or any previous visit (from the past 240 hour(s)).  Radiology Reports DG Chest Port 1 View  Result Date: 10/28/2020 CLINICAL DATA:  COVID-19 positive, Increased shortness of breath, former smoker EXAM: PORTABLE CHEST 1 VIEW COMPARISON:  Portable exam at 0832 hrs compared to 10/26/2020 FINDINGS: Normal heart size, mediastinal contours, and pulmonary vascularity. Diffuse BILATERAL pulmonary infiltrates consistent with multifocal pneumonia not significantly changed. No pleural effusion or pneumothorax. IMPRESSION: Persistent diffuse BILATERAL pulmonary infiltrates consistent with multifocal pneumonia, not significantly changed. Electronically Signed   By: Lavonia Dana M.D.   On: 10/28/2020 08:56   DG Chest Port 1 View  Result Date: 10/26/2020 CLINICAL DATA:  COVID pneumonia EXAM: PORTABLE CHEST 1 VIEW COMPARISON:  October 21, 2020 FINDINGS: The heart size is stable. Diffuse bilateral hazy airspace opacities are again noted. These appeared to be nearly stable from prior study if not slightly improved. There is no definite pneumothorax. There are probable small bilateral pleural effusions. There is no acute osseous abnormality. IMPRESSION: Persistent diffuse bilateral hazy airspace opacities, similar to slightly improved from prior study. Electronically Signed   By: Constance Holster M.D.   On: 10/26/2020 06:11   DG Chest Port 1 View  Result Date: 10/21/2020 CLINICAL DATA:  Short of breath.  COVID positive EXAM: PORTABLE CHEST 1  VIEW COMPARISON:  None. FINDINGS: Low lung volumes. normal cardiac silhouette. There is bilateral diffuse fine airspace disease. No pleural fluid. No pneumothorax. IMPRESSION: Bilateral diffuse airspace disease consistent with viral pneumonia. Electronically Signed   By: Suzy Bouchard M.D.   On: 10/21/2020 08:17   DG Chest Port 1 View  Result Date: 10/06/2020 CLINICAL DATA:  COVID positive, pleural effusion, pneumothorax EXAM: PORTABLE CHEST 1 VIEW COMPARISON:  09/26/2020 FINDINGS: Slight interval increase in diffuse bilateral heterogeneous and interstitial airspace opacity, most conspicuous at the lung bases. No significant pleural effusion noted. No pneumothorax. The heart and mediastinum are unremarkable. IMPRESSION: 1. Slight interval increase in diffuse bilateral heterogeneous and interstitial airspace opacity, most conspicuous at the lung bases, consistent with worsened COVID airspace disease. 2.  No significant pleural effusion noted.  No pneumothorax. Electronically Signed   By: Eddie Candle M.D.   On: 10/06/2020 09:37   DG Chest Port 1V same Day  Result Date: 10/16/2020 CLINICAL DATA:  COVID positive, short of breath EXAM: PORTABLE CHEST 1 VIEW COMPARISON:  None. FINDINGS: Normal cardiac silhouette. Bilateral peripheral fine airspace disease appears slightly less dense than comparison exam. No pneumothorax. No focal consolidation. IMPRESSION: Mild improvement in bilateral multifocal viral pneumonia. Electronically Signed   By: Suzy Bouchard M.D.   On: 10/16/2020 13:32   DG Chest Port 1V same Day  Result Date: 10/13/2020 CLINICAL DATA:  55 year old male with respiratory failure. Recent bronchoscopy and hemoptysis. History of COVID-19. EXAM: PORTABLE CHEST 1 VIEW COMPARISON:  Portable chest 10/11/2020 and earlier. FINDINGS: Portable AP semi upright view at 0554 hours. Lower lung volumes. Stable cardiac size and mediastinal contours. Visualized tracheal air column is within normal limits.  Diffuse reticulonodular pulmonary opacity with confluent density in the peripheral right lung and at the lung bases now worse on the right. No pneumothorax. No definite pleural effusion. Stable visualized osseous structures. IMPRESSION: Continued diffuse reticulonodular opacity with lower lung volumes and worsening ventilation since 10/11/2020 at both lung bases. Electronically Signed   By: Genevie Ann M.D.   On: 10/13/2020 07:57   DG Chest Port 1V same Day  Result Date: 10/11/2020 CLINICAL DATA:  Hypoxia.  Shortness of breath. EXAM: PORTABLE CHEST 1 VIEW COMPARISON:  October 06, 2020. FINDINGS: The heart size and mediastinal contours are within normal limits. No pneumothorax or pleural effusion is noted. Stable bilateral lung opacities are noted concerning for multifocal pneumonia. The visualized skeletal structures are unremarkable. IMPRESSION: Stable bilateral lung opacities are noted concerning for multifocal pneumonia. Electronically Signed   By: Marijo Conception M.D.   On: 10/11/2020 10:06   VAS [place] LOWER EXTREMITY VENOUS (DVT)  Result Date: 10/12/2020  Lower Venous DVT Study Indications: Worsening hypoxia, Covid-19.  Comparison Study: No prior studies. Performing Technologist: Darlin Coco, RDMS  Examination Guidelines: A complete evaluation includes B-mode imaging, spectral Doppler, color Doppler, and power Doppler as needed of all accessible portions of each vessel. Bilateral testing is considered an integral part of a complete examination. Limited examinations for reoccurring indications may be performed as noted. The reflux portion of the exam is performed with the patient in reverse Trendelenburg.  +---------+---------------+---------+-----------+----------+--------------+ RIGHT    CompressibilityPhasicitySpontaneityPropertiesThrombus Aging +---------+---------------+---------+-----------+----------+--------------+ CFV      Full           Yes      Yes                                  +---------+---------------+---------+-----------+----------+--------------+ SFJ      Full                                                        +---------+---------------+---------+-----------+----------+--------------+ FV Prox  Full                                                        +---------+---------------+---------+-----------+----------+--------------+ FV Mid   Full                                                        +---------+---------------+---------+-----------+----------+--------------+  FV DistalFull                                                        +---------+---------------+---------+-----------+----------+--------------+ PFV      Full                                                        +---------+---------------+---------+-----------+----------+--------------+ POP      Full           Yes      Yes                                 +---------+---------------+---------+-----------+----------+--------------+ PTV      Full                                                        +---------+---------------+---------+-----------+----------+--------------+ PERO     Full                                                        +---------+---------------+---------+-----------+----------+--------------+   +---------+---------------+---------+-----------+----------+--------------+ LEFT     CompressibilityPhasicitySpontaneityPropertiesThrombus Aging +---------+---------------+---------+-----------+----------+--------------+ CFV      Full           Yes      Yes                                 +---------+---------------+---------+-----------+----------+--------------+ SFJ      Full                                                        +---------+---------------+---------+-----------+----------+--------------+ FV Prox  Full                                                         +---------+---------------+---------+-----------+----------+--------------+ FV Mid   Full                                                        +---------+---------------+---------+-----------+----------+--------------+ FV DistalFull                                                        +---------+---------------+---------+-----------+----------+--------------+   PFV      Full                                                        +---------+---------------+---------+-----------+----------+--------------+ POP      Full           Yes      Yes                                 +---------+---------------+---------+-----------+----------+--------------+ PTV      Full                                                        +---------+---------------+---------+-----------+----------+--------------+ PERO     Full                                                        +---------+---------------+---------+-----------+----------+--------------+     Summary: RIGHT: - There is no evidence of deep vein thrombosis in the lower extremity.  - No cystic structure found in the popliteal fossa.  LEFT: - There is no evidence of deep vein thrombosis in the lower extremity.  - No cystic structure found in the popliteal fossa.  *See table(s) above for measurements and observations. Electronically signed by Ruta Hinds MD on 10/12/2020 at 10:00:37 AM.    Final    ECHOCARDIOGRAM LIMITED  Result Date: 09/28/2020    ECHOCARDIOGRAM LIMITED REPORT   Patient Name:   Matthew [name] Date of Exam: 09/28/2020 Medical Rec #:  [id number]          Height:       71.0 in Accession #:    [id number]         Weight:       244.3 lb Date of Birth:  1965/11/20          BSA:          2.296 m Patient Age:    59 years           BP:           120/84 mmHg Patient Gender: M                  HR:           73 bpm. Exam Location:  Inpatient Procedure: Limited Echo, Limited Color Doppler and Cardiac Doppler Indications:     acute respiratory distress  History:        Patient has prior history of Echocardiogram examinations, most                 recent 03/30/2016. CAD; Covid.  Sonographer:    Johny Chess Referring Phys: [id number] Candace Gallus MELVIN  Sonographer Comments: Image acquisition challenging due to respiratory motion. IMPRESSIONS  1. Left ventricular ejection fraction, by estimation, is 60 to 65%. The left ventricle has normal function. The left ventricle has no regional wall motion abnormalities. There is mild left ventricular hypertrophy.  2. Right ventricular systolic function is normal. The right ventricular size is normal.  3. The mitral valve is normal in structure. No evidence of mitral valve regurgitation. No evidence of mitral stenosis.  4. The aortic valve is tricuspid.  5. The inferior vena cava is normal in size with greater than 50% respiratory variability, suggesting right atrial pressure of 3 mmHg. FINDINGS  Left Ventricle: Left ventricular ejection fraction, by estimation, is 60 to 65%. The left ventricle has normal function. The left ventricle has no regional wall motion abnormalities. The left ventricular internal cavity size was normal in size. There is  mild left ventricular hypertrophy. Right Ventricle: The right ventricular size is normal. No increase in right ventricular wall thickness. Right ventricular systolic function is normal. Pericardium: There is no evidence of pericardial effusion. Mitral Valve: The mitral valve is normal in structure. No evidence of mitral valve stenosis. Tricuspid Valve: The tricuspid valve is normal in structure. Tricuspid valve regurgitation is not demonstrated. No evidence of tricuspid stenosis. Aortic Valve: The aortic valve is tricuspid. Pulmonic Valve: The pulmonic valve was not well visualized. Pulmonic valve regurgitation is not visualized. No evidence of pulmonic stenosis. Aorta: The aortic root is normal in size and structure. Pulmonary Artery: Indeterminant PASP,  inadequate TR jet. Venous: The inferior vena cava is normal in size with greater than 50% respiratory variability, suggesting right atrial pressure of 3 mmHg. IAS/Shunts: No atrial level shunt detected by color flow Doppler. LEFT VENTRICLE PLAX 2D LVIDd:         4.20 cm LVIDs:         2.80 cm LV PW:         1.30 cm LV IVS:        1.10 cm LVOT diam:     2.00 cm LV SV:         71 LV SV Index:   31 LVOT Area:     3.14 cm  IVC IVC diam: 1.70 cm LEFT ATRIUM         Index LA diam:    4.30 cm 1.87 cm/m  AORTIC VALVE LVOT Vmax:   116.00 cm/s LVOT Vmean:  76.100 cm/s LVOT VTI:    0.227 m  AORTA Ao Root diam: 3.10 cm MITRAL VALVE MV Area (PHT): 2.91 cm    SHUNTS MV Decel Time: 261 msec    Systemic VTI:  0.23 m MV E velocity: 75.40 cm/s  Systemic Diam: 2.00 cm MV A velocity: 66.00 cm/s MV E/A ratio:  1.14 Carlyle Dolly MD Electronically signed by Carlyle Dolly MD Signature Date/Time: 09/28/2020/2:52:33 PM    Final

## 2020-10-28 NOTE — Progress Notes (Signed)
Occupational Therapy Treatment Patient Details Name: Matthew [name] MRN: [id number] DOB: November 15, 1965 Today's Date: 10/28/2020    History of present illness Pt is a 55 y.o. male admitted 09/26/20 with SOB and GI distress (had declined COVID test when seen in urgent care). Pt now testing (+) COVID-19. Workup for acute hypoxic respiratory failure due to acute COVID-19 viral PNA. Course complicated by hemoptysis, worsening hypoxemis requiring HHFNC; pt unable to lay flat for chest CTA. Negative DVT on 12/31. Repeat CXR 1/10 with diffuse bilateral airspace disease. PMH includes CAD, CHF, tobacco use (quit 11/2019); pt unvaccinated.   OT comments  Pt with minimal to no progress with OT at this time. Pt refusing EOB or OOB mobility today with much of session spent on education for why continued mobility each day is vital (no matter how small/little) to continue progressing his strength, endurance and for improving overall cardiorespiratory status. Pt demonstrates bridging as well as repositioning in bed at mod independent level, lowest Spo2 noted with bed level activity/talking 86% on HHFNC (30L, 50% FiO2). Will continue to follow acutely/continue efforts. Given pt's current status recommend post acute rehab services (SNF vs LTACH) at time of discharge.    Follow Up Recommendations  LTACH;SNF    Equipment Recommendations  Tub/shower bench (TBD)          Precautions / Restrictions Precautions Precautions: Fall;Other (comment) Precaution Comments: anxiety; watch SpO2 on 30L O2 HHFNC at 50% FiO2 Restrictions Weight Bearing Restrictions: No       Mobility Bed Mobility Overal bed mobility: Modified Independent             General bed mobility comments: Mod indep for repositioning self in bed rolling supine<>sidelying; SpO2 down to 86% with this and pt talking  Transfers                 General transfer comment: pt declined        ADL either performed or assessed with  clinical judgement   ADL Overall ADL's : Needs assistance/impaired                                       General ADL Comments: much of session spent on education/importance of continued mobility beyond bed level. pt remains self limiting with all activity                       Cognition Arousal/Alertness: Awake/alert Behavior During Therapy: WFL for tasks assessed/performed;Anxious Overall Cognitive Status: Within Functional Limits for tasks assessed                                 General Comments: Improved anxiety at bed-level this session; self-limiting with c/o "too tired" to get out of bed despite max encouragement and education on importance of mobility/OOB        Exercises     Shoulder Instructions       General Comments Pt with no SOB noted today at rest on 30L O2 HHFNC at 50% FiO2 this session. Pt continues with self-limiting behavior when it comes to mobilizing. Significant increased time and effort encouraging mobility and educating pt on importance of getting out of bed. per chart pt has been declining OOB to chair. Pt reports, "I feel the best I've felt in a while" but then declines OOB mobility secondary to  fatigue, stating, "I'll do it tomorrow." Educ pt that he can sit EOB and reposition self in bed without assist from nursing  however pt stating he cannot    Pertinent Vitals/ Pain       Pain Assessment: No/denies pain Pain Intervention(s): Monitored during session  Home Living                                          Prior Functioning/Environment              Frequency  Min 3X/week        Progress Toward Goals  OT Goals(current goals can now be found in the care plan section)  Progress towards OT goals: Not progressing toward goals - comment (pt self limiting)  Acute Rehab OT Goals Patient Stated Goal: to rest; "I'll get out of bed tomorrow" OT Goal Formulation: With patient Time For Goal  Achievement: 10/30/20 Potential to Achieve Goals: Good ADL Goals Pt Will Perform Lower Body Bathing: with modified independence;sit to/from stand Pt Will Perform Lower Body Dressing: with modified independence;sit to/from stand Pt Will Transfer to Toilet: with modified independence;ambulating Pt/caregiver will Perform Home Exercise Program: Increased strength;Both right and left upper extremity;With written HEP provided;Independently;With theraband Additional ADL Goal #1: Pt will independently verbalize 3 energy conservation strategies Additional ADL Goal #2: Pt will maintain SpO2 greater than 88 during functional activity Additional ADL Goal #3: Pt will demonstrate use of 2 anxiety managment strategies with min vc to help with mobility and ADL tasks  Plan Discharge plan needs to be updated;Discharge plan remains appropriate    Co-evaluation    PT/OT/SLP Co-Evaluation/Treatment: Yes Reason for Co-Treatment: Complexity of the patient's impairments (multi-system involvement) (lack of motivation to participate, high complexity, poor tolerance)          AM-PAC OT "6 Clicks" Daily Activity     Outcome Measure   Help from another person eating meals?: A Little Help from another person taking care of personal grooming?: A Little Help from another person toileting, which includes using toliet, bedpan, or urinal?: A Little Help from another person bathing (including washing, rinsing, drying)?: A Little Help from another person to put on and taking off regular upper body clothing?: A Little Help from another person to put on and taking off regular lower body clothing?: A Little 6 Click Score: 18    End of Session Equipment Utilized During Treatment: Oxygen  OT Visit Diagnosis: Unsteadiness on feet (R26.81);Other abnormalities of gait and mobility (R26.89);Muscle weakness (generalized) (M62.81);Dizziness and giddiness (R42)   Activity Tolerance Other (comment) (pt self-limiting)    Patient Left in bed;with call bell/phone within reach   Nurse Communication Mobility status        Time: 8101-7510 OT Time Calculation (min): 16 min  Charges: OT General Charges $OT Visit: 1 Visit  Lou Cal, OT Acute Rehabilitation Services Pager [phone] Office [phone]    Raymondo Band 10/28/2020, 4:34 PM

## 2020-10-28 NOTE — Progress Notes (Signed)
Physical Therapy Treatment Patient Details Name: Matthew [name] MRN: [id number] DOB: 1965-12-10 Today's Date: 10/28/2020    History of Present Illness Pt is a 55 y.o. male admitted 09/26/20 with SOB and GI distress (had declined COVID test when seen in urgent care). Pt now testing (+) COVID-19. Workup for acute hypoxic respiratory failure due to acute COVID-19 viral PNA. Course complicated by hemoptysis, worsening hypoxemis requiring HHFNC; pt unable to lay flat for chest CTA. Negative DVT on 12/31. Repeat CXR 1/10 with diffuse bilateral airspace disease. PMH includes CAD, CHF, tobacco use (quit 11/2019); pt unvaccinated.   PT Comments    Pt noted to have little to no SOB at rest this session, appears to look the best he has in a few weeks; pt also reports, "I feel the best that I've felt in a while." SpO2 86-94% on 30L O2 HHFNC at 50% FiO2. Pt pleasant and agreeable with conversation, but when it comes time to getting OOB, pt adamantly declining secondary to fatigue. Significant increased time discussing and educating pt on importance of activity progression with current disease process. Pt still declines, stating, "I'll get out of bed tomorrow." Pt repositioning himself in bed independently; can sit up to EOB by himself, but also refuses to do this on his own. Will continue to follow acutely if pt agreeable to participate.    Follow Up Recommendations  Home health PT;Supervision for mobility/OOB (pending progress)     Equipment Recommendations   (TBD)    Recommendations for Other Services       Precautions / Restrictions Precautions Precautions: Fall;Other (comment) Precaution Comments: anxiety; watch SpO2 on 30L O2 HHFNC at 50% FiO2 Restrictions Weight Bearing Restrictions: No    Mobility  Bed Mobility Overal bed mobility: Modified Independent             General bed mobility comments: Mod indep for repositioning self in bed rolling supine<>sidelying; SpO2 down to 86% with  this and pt talking  Transfers                 General transfer comment: pt declined  Ambulation/Gait                 Stairs             Wheelchair Mobility    Modified Rankin (Stroke Patients Only)       Balance                                            Cognition Arousal/Alertness: Awake/alert Behavior During Therapy: WFL for tasks assessed/performed;Anxious Overall Cognitive Status: Within Functional Limits for tasks assessed                                 General Comments: Improved anxiety at bed-level this session; self-limiting with c/o "too tired" to get out of bed despite max encouragement and education on importance of mobility/OOB      Exercises      General Comments General comments (skin integrity, edema, etc.): Pt with no SOB noted for the first time since admission at rest on 30L O2 HHFNC at 50% FiO2 this session. Pt continues with self-limiting behavior when it comes to mobilizing. Significant increased time and effort encouraging mobility and educating pt on importance of getting out of bed. To my knowledge,  pt has not been up to recliner since earlier last week (except maybe a time or two this weekend). Pt reports, "I feel the best I've felt in a while" but then declines OOB mobility secondary to fatigue, stating, "I'll do it tomorrow." Educ pt that he can sit EOB and reposition self in bed without assist from nursing staff; pt reports, "No I can't, that alarm goes off then they bitch at me." Educ pt he is safe to sit EOB without assist      Pertinent Vitals/Pain Pain Assessment: No/denies pain Pain Intervention(s): Monitored during session    Home Living                      Prior Function            PT Goals (current goals can now be found in the care plan section) Acute Rehab PT Goals Patient Stated Goal: to rest; "I'll get out of bed tomorrow" PT Goal Formulation: With patient Time  For Goal Achievement: 11/11/20 Potential to Achieve Goals: Fair Progress towards PT goals: Not progressing toward goals - comment (self-limiting)    Frequency    Min 3X/week      PT Plan Current plan remains appropriate    Co-evaluation PT/OT/SLP Co-Evaluation/Treatment: Yes Reason for Co-Treatment: Complexity of the patient's impairments (multi-system involvement);Other (comment) (lack of motivation to participate, high complexity, poor tolerance)          AM-PAC PT "6 Clicks" Mobility   Outcome Measure  Help needed turning from your back to your side while in a flat bed without using bedrails?: None Help needed moving from lying on your back to sitting on the side of a flat bed without using bedrails?: None Help needed moving to and from a bed to a chair (including a wheelchair)?: A Little Help needed standing up from a chair using your arms (e.g., wheelchair or bedside chair)?: A Little Help needed to walk in hospital room?: A Little Help needed climbing 3-5 steps with a railing? : A Lot 6 Click Score: 19    End of Session Equipment Utilized During Treatment: Oxygen Activity Tolerance: Patient limited by fatigue Patient left: in bed;with call bell/phone within reach Nurse Communication: Mobility status PT Visit Diagnosis: Other abnormalities of gait and mobility (R26.89)     Time: 1610-9604 PT Time Calculation (min) (ACUTE ONLY): 16 min  Charges:  $Self Care/Home Management: 8-22                    Mabeline Caras, PT, DPT Acute Rehabilitation Services  Pager [phone] Office [place] 10/28/2020, 4:05 PM

## 2020-10-28 NOTE — Progress Notes (Signed)
Patient lying in bed. Patient not wanting to get up to the chair. Patient states he is to tired. Educated patient on why getting up was important. Patient stated he did not want to get up at this time.

## 2020-10-28 NOTE — Progress Notes (Signed)
   10/28/20 0030  Assess: MEWS Score  Temp 97.9 F (36.6 C)  BP (!) 87/61  Pulse Rate 92  ECG Heart Rate 76  Resp (!) 36  Level of Consciousness Alert  SpO2 96 %  O2 Device HFNC  Patient Activity (if Appropriate) In bed  O2 Flow Rate (L/min) 30 L/min  FiO2 (%) 40 %  Assess: MEWS Score  MEWS Temp 0  MEWS Systolic 1  MEWS Pulse 0  MEWS RR 3  MEWS LOC 0  MEWS Score 4  MEWS Score Color Red  Assess: if the MEWS score is Yellow or Red  Were vital signs taken at a resting state? No  Focused Assessment No change from prior assessment  Early Detection of Sepsis Score *See Row Information* Low  MEWS guidelines implemented *See Row Information* Yes  Treat  MEWS Interventions Other (Comment) (n/a)  Pain Scale 0-10  Pain Score 0  Take Vital Signs  Increase Vital Sign Frequency  Red: Q 1hr X 4 then Q 4hr X 4, if remains red, continue Q 4hrs  Escalate  MEWS: Escalate Red: discuss with charge nurse/RN and provider, consider discussing with RRT  Notify: Charge Nurse/RN  Name of Charge Nurse/RN Notified Elmyra Ricks  Date Charge Nurse/RN Notified 10/28/20  Time Charge Nurse/RN Notified 0030  Notify: Provider  Provider Name/Title D Shalhoub  Date Provider Notified 10/28/20  Time Provider Notified [phone]  Notification Type Page  Notification Reason Change in status (red MEWS)  Response No new orders  Document  Patient Outcome Other (Comment) (pt asymptomatic; vital signs increased.)  Progress note created (see row info) Yes

## 2020-10-29 LAB — CBC WITH DIFFERENTIAL/PLATELET
Abs Immature Granulocytes: 0.48 10*3/uL — ABNORMAL HIGH (ref 0.00–0.07)
Basophils Absolute: 0.1 10*3/uL (ref 0.0–0.1)
Basophils Relative: 1 %
Eosinophils Absolute: 0.2 10*3/uL (ref 0.0–0.5)
Eosinophils Relative: 2 %
HCT: 33.1 % — ABNORMAL LOW (ref 39.0–52.0)
Hemoglobin: 11.1 g/dL — ABNORMAL LOW (ref 13.0–17.0)
Immature Granulocytes: 4 %
Lymphocytes Relative: 10 %
Lymphs Abs: 1.2 10*3/uL (ref 0.7–4.0)
MCH: 31.2 pg (ref 26.0–34.0)
MCHC: 33.5 g/dL (ref 30.0–36.0)
MCV: 93 fL (ref 80.0–100.0)
Monocytes Absolute: 0.6 10*3/uL (ref 0.1–1.0)
Monocytes Relative: 5 %
Neutro Abs: 10 10*3/uL — ABNORMAL HIGH (ref 1.7–7.7)
Neutrophils Relative %: 78 %
Platelets: 191 10*3/uL (ref 150–400)
RBC: 3.56 MIL/uL — ABNORMAL LOW (ref 4.22–5.81)
RDW: 13.9 % (ref 11.5–15.5)
WBC: 12.6 10*3/uL — ABNORMAL HIGH (ref 4.0–10.5)
nRBC: 0.2 % (ref 0.0–0.2)

## 2020-10-29 LAB — C-REACTIVE PROTEIN: CRP: 13.1 mg/dL — ABNORMAL HIGH (ref <1.0)

## 2020-10-29 LAB — COMPREHENSIVE METABOLIC PANEL
ALT: 41 U/L (ref 0–44)
AST: 20 U/L (ref 15–41)
Albumin: 2.2 g/dL — ABNORMAL LOW (ref 3.5–5.0)
Alkaline Phosphatase: 99 U/L (ref 38–126)
Anion gap: 10 (ref 5–15)
BUN: 10 mg/dL (ref 6–20)
CO2: 25 mmol/L (ref 22–32)
Calcium: 8.2 mg/dL — ABNORMAL LOW (ref 8.9–10.3)
Chloride: 101 mmol/L (ref 98–111)
Creatinine, Ser: 0.84 mg/dL (ref 0.61–1.24)
GFR, Estimated: >60 mL/min (ref >60)
Glucose, Bld: 115 mg/dL — ABNORMAL HIGH (ref 70–99)
Potassium: 3.5 mmol/L (ref 3.5–5.1)
Sodium: 136 mmol/L (ref 135–145)
Total Bilirubin: 0.9 mg/dL (ref 0.3–1.2)
Total Protein: 6.7 g/dL (ref 6.5–8.1)

## 2020-10-29 LAB — BRAIN NATRIURETIC PEPTIDE: B Natriuretic Peptide: 149.1 pg/mL — ABNORMAL HIGH (ref 0.0–100.0)

## 2020-10-29 LAB — CORTISOL: Cortisol, Plasma: 11.4 ug/dL

## 2020-10-29 LAB — D-DIMER, QUANTITATIVE: D-Dimer, Quant: 2.75 ug/mL-FEU — ABNORMAL HIGH (ref 0.00–0.50)

## 2020-10-29 LAB — MAGNESIUM: Magnesium: 2.2 mg/dL (ref 1.7–2.4)

## 2020-10-29 LAB — TSH: TSH: 1.618 u[IU]/mL (ref 0.350–4.500)

## 2020-10-29 MED ORDER — MIDODRINE HCL 5 MG PO TABS
10.0000 mg | ORAL_TABLET | Freq: Two times a day (BID) | ORAL | Status: DC
Start: 2020-10-29 — End: 2020-11-16
  Administered 2020-10-29 – 2020-11-16 (×36): 10 mg via ORAL
  Filled 2020-10-29 (×38): qty 2

## 2020-10-29 MED ORDER — ENOXAPARIN SODIUM 60 MG/0.6ML ~~LOC~~ SOLN
60.0000 mg | Freq: Two times a day (BID) | SUBCUTANEOUS | Status: DC
  Administered 2020-10-29 – 2020-10-31 (×4): 60 mg via SUBCUTANEOUS
  Filled 2020-10-29 (×4): qty 0.6

## 2020-10-29 MED ORDER — LACTATED RINGERS IV SOLN: INTRAVENOUS | Status: DC
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
## 2020-10-30 LAB — COMPREHENSIVE METABOLIC PANEL
ALT: 35 U/L (ref 0–44)
AST: 17 U/L (ref 15–41)
Albumin: 2.2 g/dL — ABNORMAL LOW (ref 3.5–5.0)
Alkaline Phosphatase: 88 U/L (ref 38–126)
Anion gap: 10 (ref 5–15)
BUN: 9 mg/dL (ref 6–20)
CO2: 26 mmol/L (ref 22–32)
Calcium: 8.2 mg/dL — ABNORMAL LOW (ref 8.9–10.3)
Chloride: 100 mmol/L (ref 98–111)
Creatinine, Ser: 0.77 mg/dL (ref 0.61–1.24)
GFR, Estimated: >60 mL/min (ref >60)
Glucose, Bld: 99 mg/dL (ref 70–99)
Potassium: 3.4 mmol/L — ABNORMAL LOW (ref 3.5–5.1)
Sodium: 136 mmol/L (ref 135–145)
Total Bilirubin: 0.9 mg/dL (ref 0.3–1.2)
Total Protein: 6.4 g/dL — ABNORMAL LOW (ref 6.5–8.1)

## 2020-10-30 LAB — CBC WITH DIFFERENTIAL/PLATELET
Abs Immature Granulocytes: 0.42 10*3/uL — ABNORMAL HIGH (ref 0.00–0.07)
Basophils Absolute: 0.1 10*3/uL (ref 0.0–0.1)
Basophils Relative: 1 %
Eosinophils Absolute: 0.2 10*3/uL (ref 0.0–0.5)
Eosinophils Relative: 2 %
HCT: 32.1 % — ABNORMAL LOW (ref 39.0–52.0)
Hemoglobin: 10.6 g/dL — ABNORMAL LOW (ref 13.0–17.0)
Immature Granulocytes: 3 %
Lymphocytes Relative: 11 %
Lymphs Abs: 1.4 10*3/uL (ref 0.7–4.0)
MCH: 31.1 pg (ref 26.0–34.0)
MCHC: 33 g/dL (ref 30.0–36.0)
MCV: 94.1 fL (ref 80.0–100.0)
Monocytes Absolute: 0.7 10*3/uL (ref 0.1–1.0)
Monocytes Relative: 5 %
Neutro Abs: 9.6 10*3/uL — ABNORMAL HIGH (ref 1.7–7.7)
Neutrophils Relative %: 78 %
Platelets: 201 10*3/uL (ref 150–400)
RBC: 3.41 MIL/uL — ABNORMAL LOW (ref 4.22–5.81)
RDW: 14 % (ref 11.5–15.5)
WBC: 12.3 10*3/uL — ABNORMAL HIGH (ref 4.0–10.5)
nRBC: 0 % (ref 0.0–0.2)

## 2020-10-30 LAB — MAGNESIUM: Magnesium: 2 mg/dL (ref 1.7–2.4)

## 2020-10-30 LAB — D-DIMER, QUANTITATIVE: D-Dimer, Quant: 1.57 ug/mL-FEU — ABNORMAL HIGH (ref 0.00–0.50)

## 2020-10-30 LAB — BRAIN NATRIURETIC PEPTIDE: B Natriuretic Peptide: 103.6 pg/mL — ABNORMAL HIGH (ref 0.0–100.0)

## 2020-10-30 LAB — C-REACTIVE PROTEIN: CRP: 14.8 mg/dL — ABNORMAL HIGH (ref <1.0)

## 2020-10-30 MED ORDER — POTASSIUM CHLORIDE CRYS ER 20 MEQ PO TBCR
40.0000 meq | EXTENDED_RELEASE_TABLET | Freq: Once | ORAL | Status: AC
  Administered 2020-10-30: 40 meq via ORAL
  Filled 2020-10-30: qty 2

## 2020-10-30 NOTE — Progress Notes (Signed)
Physical Therapy Treatment Patient Details Name: Matthew [name] MRN: [id number] DOB: [birth year] Today's Date: 10/30/2020    History of Present Illness Pt is a 55 y.o. male admitted 09/26/20 with SOB and GI distress (had declined COVID test when seen in urgent care). Pt now testing (+) COVID-19. Workup for acute hypoxic respiratory failure due to acute COVID-19 viral PNA. Course complicated by hemoptysis, worsening hypoxemis requiring HHFNC; pt unable to lay flat for chest CTA. Negative DVT on 12/31. Repeat CXR 1/10 with diffuse bilateral airspace disease. PMH includes CAD, CHF, tobacco use (quit 11/2019); pt unvaccinated.    PT Comments    Pt willing and was able to ambulate today.  He is on 35L at 70% FiO2 HHF at rest and used 15 L HFNC and 15 L NRB to ambulate with sats maintaining 92% but DOE of 4/4 and RR up to 54 breaths per minute.  Pt required significant cues for relaxation and breathing technique.  Increased time for recovery, line management, and pt's mental prep.  He was able to demonstrate progress with mobility today.  Continue to advance as able.     Follow Up Recommendations  Home health PT;Supervision for mobility/OOB (pending progress)     Equipment Recommendations  Other (comment) (rollator vs w/c - continue to assess)    Recommendations for Other Services       Precautions / Restrictions Precautions Precautions: Fall;Other (comment) Precaution Comments: anxiety; watch SpO2 on 35L O2 HHFNC at 70% FiO2    Mobility  Bed Mobility               General bed mobility comments: in chair  Transfers Overall transfer level: Needs assistance Equipment used: Rolling walker (2 wheeled) Transfers: Sit to/from Stand Sit to Stand: Min assist;Min guard         General transfer comment: min guard from recliner; min A from straight back chair; cues for hand placement  Ambulation/Gait Ambulation/Gait assistance: Min assist Gait Distance (Feet): 16 Feet  (16'x2) Assistive device: Rolling walker (2 wheeled) Gait Pattern/deviations: Step-to pattern;Shuffle Gait velocity: Decreased   General Gait Details: Ambulated to door , took 10 min seated rest break then ambulated back to recliner.  Had 1 LOB requiring min A , otherwise min guard.  Cues for relaxation, controlled breaths, and controlled turns.  See below for vitals   Stairs             Wheelchair Mobility    Modified Rankin (Stroke Patients Only)       Balance Overall balance assessment: Needs assistance Sitting-balance support: Feet supported;Feet unsupported Sitting balance-Leahy Scale: Good     Standing balance support: Bilateral upper extremity supported Standing balance-Leahy Scale: Poor Standing balance comment: Reliant on UE support                            Cognition Arousal/Alertness: Awake/alert Behavior During Therapy: Anxious Overall Cognitive Status: Within Functional Limits for tasks assessed                                        Exercises      General Comments General comments (skin integrity, edema, etc.): Pt on 35 L 70% FiO2 HHF oxygen with sats 96%.  Ambulated on 15 L HFNC and 15 L NRB mask with sats dropping to 92% but with DOE of 4/4 and RR up to  52 breaths per minute.  Gave cues for relaxation, controlled breaths, and also provide distraction when able to try to ease anxiety.  Pt's RR returning to 30 and sats upper 90's after 3 mins rest , but he needed 10 mins before walking back to chair. Similar recovery in chair.      Pertinent Vitals/Pain Pain Assessment: No/denies pain    Home Living                      Prior Function            PT Goals (current goals can now be found in the care plan section) Acute Rehab PT Goals Patient Stated Goal: walk to door andback PT Goal Formulation: With patient Time For Goal Achievement: 11/11/20 Potential to Achieve Goals: Fair Progress towards PT goals:  Progressing toward goals    Frequency    Min 3X/week      PT Plan Current plan remains appropriate    Co-evaluation              AM-PAC PT "6 Clicks" Mobility   Outcome Measure  Help needed turning from your back to your side while in a flat bed without using bedrails?: None Help needed moving from lying on your back to sitting on the side of a flat bed without using bedrails?: None Help needed moving to and from a bed to a chair (including a wheelchair)?: A Little Help needed standing up from a chair using your arms (e.g., wheelchair or bedside chair)?: A Little Help needed to walk in hospital room?: A Little Help needed climbing 3-5 steps with a railing? : A Little 6 Click Score: 20    End of Session Equipment Utilized During Treatment: Oxygen Activity Tolerance: Other (comment) (willing to work with [place] today but still anxious) Patient left: in bed;with call bell/phone within reach Nurse Communication: Mobility status PT Visit Diagnosis: Other abnormalities of gait and mobility (R26.89)     Time: 0263-7858 PT Time Calculation (min) (ACUTE ONLY): 36 min  Charges:  $Gait Training: 8-22 mins $Therapeutic Activity: 8-22 mins                     Abran Richard, PT Acute Rehab Services Pager [phone] [facility] [place] 10/30/2020, 4:18 PM
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
## 2020-10-31 ENCOUNTER — Inpatient Hospital Stay (HOSPITAL_COMMUNITY): Payer: HRSA Program

## 2020-10-31 LAB — COMPREHENSIVE METABOLIC PANEL
ALT: 33 U/L (ref 0–44)
AST: 19 U/L (ref 15–41)
Albumin: 2.2 g/dL — ABNORMAL LOW (ref 3.5–5.0)
Alkaline Phosphatase: 89 U/L (ref 38–126)
Anion gap: 10 (ref 5–15)
BUN: 9 mg/dL (ref 6–20)
CO2: 29 mmol/L (ref 22–32)
Calcium: 8.3 mg/dL — ABNORMAL LOW (ref 8.9–10.3)
Chloride: 99 mmol/L (ref 98–111)
Creatinine, Ser: 0.94 mg/dL (ref 0.61–1.24)
GFR, Estimated: >60 mL/min (ref >60)
Glucose, Bld: 96 mg/dL (ref 70–99)
Potassium: 3.7 mmol/L (ref 3.5–5.1)
Sodium: 138 mmol/L (ref 135–145)
Total Bilirubin: 0.7 mg/dL (ref 0.3–1.2)
Total Protein: 7 g/dL (ref 6.5–8.1)

## 2020-10-31 LAB — CBC WITH DIFFERENTIAL/PLATELET
Abs Immature Granulocytes: 0.51 10*3/uL — ABNORMAL HIGH (ref 0.00–0.07)
Basophils Absolute: 0.1 10*3/uL (ref 0.0–0.1)
Basophils Relative: 0 %
Eosinophils Absolute: 0.2 10*3/uL (ref 0.0–0.5)
Eosinophils Relative: 1 %
HCT: 34.1 % — ABNORMAL LOW (ref 39.0–52.0)
Hemoglobin: 10.8 g/dL — ABNORMAL LOW (ref 13.0–17.0)
Immature Granulocytes: 4 %
Lymphocytes Relative: 13 %
Lymphs Abs: 1.6 10*3/uL (ref 0.7–4.0)
MCH: 30.2 pg (ref 26.0–34.0)
MCHC: 31.7 g/dL (ref 30.0–36.0)
MCV: 95.3 fL (ref 80.0–100.0)
Monocytes Absolute: 0.6 10*3/uL (ref 0.1–1.0)
Monocytes Relative: 5 %
Neutro Abs: 9.7 10*3/uL — ABNORMAL HIGH (ref 1.7–7.7)
Neutrophils Relative %: 77 %
Platelets: 200 10*3/uL (ref 150–400)
RBC: 3.58 MIL/uL — ABNORMAL LOW (ref 4.22–5.81)
RDW: 13.9 % (ref 11.5–15.5)
WBC: 12.7 10*3/uL — ABNORMAL HIGH (ref 4.0–10.5)
nRBC: 0 % (ref 0.0–0.2)

## 2020-10-31 LAB — C-REACTIVE PROTEIN: CRP: 14.8 mg/dL — ABNORMAL HIGH (ref <1.0)

## 2020-10-31 LAB — MAGNESIUM: Magnesium: 2.2 mg/dL (ref 1.7–2.4)

## 2020-10-31 LAB — BRAIN NATRIURETIC PEPTIDE: B Natriuretic Peptide: 41.9 pg/mL (ref 0.0–100.0)

## 2020-10-31 LAB — D-DIMER, QUANTITATIVE: D-Dimer, Quant: 7.12 ug/mL-FEU — ABNORMAL HIGH (ref 0.00–0.50)

## 2020-10-31 MED ORDER — IOHEXOL 350 MG/ML SOLN
70.0000 mL | Freq: Once | INTRAVENOUS | Status: AC | PRN
  Administered 2020-10-31: 70 mL via INTRAVENOUS

## 2020-10-31 MED ORDER — ENOXAPARIN SODIUM 100 MG/ML ~~LOC~~ SOLN
90.0000 mg | Freq: Two times a day (BID) | SUBCUTANEOUS | Status: DC
  Administered 2020-10-31 – 2020-11-04 (×8): 90 mg via SUBCUTANEOUS
  Filled 2020-10-31 (×8): qty 1

## 2020-10-31 MED ORDER — LACTATED RINGERS IV SOLN: INTRAVENOUS | Status: AC

## 2020-10-31 MED ORDER — ENOXAPARIN SODIUM 30 MG/0.3ML ~~LOC~~ SOLN
30.0000 mg | Freq: Once | SUBCUTANEOUS | Status: AC
  Administered 2020-10-31: 30 mg via SUBCUTANEOUS
  Filled 2020-10-31: qty 0.3

## 2020-10-31 NOTE — Progress Notes (Signed)
Nutrition Follow-up  DOCUMENTATION CODES:   Not applicable  INTERVENTION:  Continue Boost Breeze po BID, each supplement provides 250 kcal and 9 grams of protein.  Encourage adequate PO intake.  NUTRITION DIAGNOSIS:   Increased nutrient needs related to acute illness (COVID-19 pneumonia) as evidenced by estimated needs; ongoing  GOAL:   Patient will meet greater than or equal to 90% of their needsprogressing  MONITOR:   PO intake,Supplement acceptance,Labs,Weight trends,I & O's  REASON FOR ASSESSMENT:   Malnutrition Screening Tool    ASSESSMENT:   55 y.o. male with PMHx of  CAD s/p PCI, tobacco use, HLD, GERD-who presented with shortness of breath-found to have acute hypoxic respiratory failure requiring 100% O2 via NRB due to COVID-19 pneumonia. Patient has been COVID-19+ since 12/16.  Pt is currently on 20L HFNC via NRB. Oxygen requirements gradually improving. Meal completion has been 75%. Pt currently has Boost Breeze with varied intake. Pt has been refusing his Prosource plus supplements. RD to discontinue Prosource. Will continue Boost Breeze to aid in caloric and protein needs. Pt encouraged to eat his food at meals and to drink his supplements.   Labs and medications reviewed.   Diet Order:   Diet Order            Diet Heart Room service appropriate? No; Fluid consistency: Thin; Fluid restriction: 2000 mL Fluid  Diet effective now                 EDUCATION NEEDS:   No education needs have been identified at this time  Skin:  Skin Assessment: Reviewed RN Assessment  Last BM:  1/19  Height:   Ht Readings from Last 1 Encounters:  09/27/20 5\' 11"  (1.803 m)    Weight:   Wt Readings from Last 1 Encounters:  10/31/20 93.8 kg   BMI:  Body mass index is 28.84 kg/m.  Estimated Nutritional Needs:   Kcal:  2300-2500  Protein:  110-120g  Fluid:  2L/day  Corrin Parker, MS, RD, LDN RD pager number/after hours weekend pager number on Amion.

## 2020-10-31 NOTE — Progress Notes (Signed)
His d-dimer increased to 7 today. D/w Dr. Candiss Norse and we will increase his Lovenox to full dose today. His previous hemoptysis has resolved.   Increase lovenox to 90mg  SQ BID F/u with CT  Onnie Boer, PharmD, BCIDP, AAHIVP, CPP Infectious Disease Pharmacist 10/31/2020 10:48 AM
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
## 2020-10-31 NOTE — Progress Notes (Signed)
Pt is in the yellow MEWs. Pt in no distress. Charge Nurse Vicente Males made aware. Will continue to monitor and check vital signs more frequently at this time.

## 2020-10-31 NOTE — Plan of Care (Signed)
  Problem: Education: Goal: Knowledge of General Education information will improve Description: Including pain rating scale, medication(s)/side effects and non-pharmacologic comfort measures Outcome: Progressing   Problem: Activity: Goal: Risk for activity intolerance will decrease Outcome: Progressing   Problem: Safety: Goal: Ability to remain free from injury will improve Outcome: Progressing   Problem: Respiratory: Goal: Will maintain a patent airway Outcome: Progressing

## 2020-10-31 NOTE — Progress Notes (Signed)
Will recheck to see if patient is not a true red MEWs. Pt in no distress. Will continue to monitor. Charge Nurse Vicente Males made aware.

## 2020-10-31 NOTE — Progress Notes (Signed)
Continuing yellow MEWs protocol.

## 2020-10-31 NOTE — Progress Notes (Signed)
Pt is in the yellow MEWs due to the respiratory rate being high and the blood pressure being low. Pt in no distress. Charge Nurse Vicente Males made aware. Will continue to monitor and check vital signs more frequently at this time.

## 2020-11-01 ENCOUNTER — Inpatient Hospital Stay (HOSPITAL_COMMUNITY): Payer: HRSA Program

## 2020-11-01 DIAGNOSIS — U071 COVID-19: Secondary | ICD-10-CM

## 2020-11-01 DIAGNOSIS — R7989 Other specified abnormal findings of blood chemistry: Secondary | ICD-10-CM

## 2020-11-01 NOTE — Plan of Care (Signed)

## 2020-11-01 NOTE — CV Procedure (Signed)
BLE venous duplex completed  Results can be found under chart review under CV PROC. 11/01/2020 11:24 AM Darald Uzzle RVT, RDMS

## 2020-11-01 NOTE — Progress Notes (Signed)
Occupational Therapy Treatment Patient Details Name: Matthew [name] MRN: [id number] DOB: Sep 07, 1966 Today's Date: 11/01/2020    History of present illness Pt is a 55 y.o. male admitted 09/26/20 with SOB and GI distress (had declined COVID test when seen in urgent care). Pt now testing (+) COVID-19. Workup for acute hypoxic respiratory failure due to acute COVID-19 viral PNA. Course complicated by hemoptysis, worsening hypoxemis requiring HHFNC; pt unable to lay flat for chest CTA. Negative DVT on 12/31. Repeat CXR 1/10 with diffuse bilateral airspace disease. PMH includes CAD, CHF, tobacco use (quit 11/2019); pt unvaccinated.   OT comments  Pt. Seen for skilled OT treatment.  Session limited due to pt. Stating he would not get eob/oob until he has his cough and anxiety meds.  States he is fearful of coughing because "once it gets going it doesn't stop".  Encouraged repositioning in bed to aide in cough reduction as he was mostly supine and towards the foot of the bed.  Pt. Reluctantly agreed to bed mobility but would not allow full repositioning and wanted hob returned to previous positioning.  Demonstrated and encouraged B UE exercises that promote bed mobility while increasing strength and endurance.  Pt. Would not return demo but states he would try the exercises later.    02 information:  90% supine at beginning of session 78% with repositioning, pt. Applied non rebreather and returned to 92%  91% at rest   Follow Up Recommendations  LTACH;SNF    Equipment Recommendations  Tub/shower bench    Recommendations for Other Services      Precautions / Restrictions Precautions Precautions: Fall;Other (comment) Precaution Comments: anxiety; watch SpO2 on 35L O2 HHFNC at 70% FiO2       Mobility Bed Mobility Overal bed mobility: Needs Assistance             General bed mobility comments: max encouragement for bed mobility/repositioning.  assisted with bed positioning and  instructed pt. to use b les to push and to pull with bues.  pt. would not allow assistance or full scooting up even with offering to assist along with explanation of benefits for breathing and to aide in decreasing coughing.  Transfers                 General transfer comment: pt. refused eob/oob until he received his cough medicine and anxiety medicine.  i reviewed i had checked with rn and he was not due for meds but she states she will bring them as soon as he is due.    Balance                                           ADL either performed or assessed with clinical judgement   ADL                                               Vision       Perception     Praxis      Cognition Arousal/Alertness: Awake/alert Behavior During Therapy: Flat affect;Agitated                                   General Comments:  states unable to attempt eob/oob secondary to not wanting to cough a lot        Exercises Other Exercises Other Exercises: provided demo of b ue exercises he can perform in bed at his own pace thoroughout the day.  encouraged using bed rails and pulling left and right for arm strengthening and also cont. work on bed mobility.  he would not return demo with max encouragement   Shoulder Instructions       General Comments      Pertinent Vitals/ Pain       Pain Assessment: No/denies pain Pain Location: no c/o pain but reporting concerns with coughing  Home Living                                          Prior Functioning/Environment              Frequency  Min 3X/week        Progress Toward Goals  OT Goals(current goals can now be found in the care plan section)  Progress towards OT goals: Progressing toward goals     Plan Discharge plan needs to be updated;Discharge plan remains appropriate    Co-evaluation                 AM-PAC OT "6 Clicks" Daily Activity      Outcome Measure   Help from another person eating meals?: A Little Help from another person taking care of personal grooming?: A Little Help from another person toileting, which includes using toliet, bedpan, or urinal?: A Little Help from another person bathing (including washing, rinsing, drying)?: A Little Help from another person to put on and taking off regular upper body clothing?: A Little Help from another person to put on and taking off regular lower body clothing?: A Little 6 Click Score: 18    End of Session Equipment Utilized During Treatment: Oxygen  OT Visit Diagnosis: Unsteadiness on feet (R26.81);Other abnormalities of gait and mobility (R26.89);Muscle weakness (generalized) (M62.81);Dizziness and giddiness (R42)   Activity Tolerance     Patient Left in bed;with call bell/phone within reach;Other (comment) (staff member entering to complete ble ultra sound)   Nurse Communication Other (comment);Patient requests pain meds (rn states ok to work with pt. alerted rn pt. requesting cough and anxiety meds. rn states not due yet but will administer as soon as he is)        Time: 2505-3976 OT Time Calculation (min): 18 min  Charges: OT General Charges $OT Visit: 1 Visit OT Treatments $Self Care/Home Management : 8-22 mins  Matthew [name], COTA/L Acute Rehabilitation [phone]   Matthew [name] 11/01/2020, 12:55 PM

## 2020-11-01 NOTE — Progress Notes (Signed)
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                                                             +---------+---------------+---------+-----------+----------+--------------+  POP       Full            Yes       Yes                                    +---------+---------------+---------+-----------+----------+--------------+  PTV       Full                                                             +---------+---------------+---------+-----------+----------+--------------+  PERO      Full                                                             +---------+---------------+---------+-----------+----------+--------------+     Summary: RIGHT: - There is no evidence of deep vein thrombosis in the lower extremity.  - No cystic structure found in the popliteal fossa.  LEFT: - There is no evidence of deep vein thrombosis in the lower extremity.  - No cystic structure found in the popliteal fossa.  *See table(s) above for measurements and observations. Electronically signed  by Ruta Hinds MD on 10/12/2020 at 10:00:37 AM.    Final

## 2020-11-01 NOTE — Progress Notes (Signed)
PT Cancellation Note  Patient Details Name: Matthew [name] MRN: [id number] DOB: [birth year]   Cancelled Treatment:    Reason Eval/Treat Not Completed: Patient declined, no reason specified .  Pt reports he has had several bowel accidents today and does not want to work with PT for fear of another bowel accident.  Pt agreeable for PT to check back on Monday 11/05/19.  Thanks,  Verdene Lennert, PT, DPT  Acute Rehabilitation [phone] pager #[phone] office      Matthew [name] 11/01/2020, 3:33 PM

## 2020-11-01 NOTE — Progress Notes (Addendum)
Pt was found on floor once bed alarm went off by Janett Billow, RN and [place], [place]. Pt states they did not hit their head, no pain, no injury. VSS. Charge Nurse Elmyra Ricks made aware and on call doctor notified. Pt safely back in bed with bed alarms on, call bell within reach. Low bed will be ordered to keep patient from falling again. Told to use call bell multiple times before attempting to get out of bed to use the bathroom. Patient states they will call wife when she wakes up today to notify her of the fall. Will check up on the progress on whether or not he did indeed call his family to tell them of the incident. Will continue to monitor.

## 2020-11-02 LAB — CBC WITH DIFFERENTIAL/PLATELET
Abs Immature Granulocytes: 0.46 10*3/uL — ABNORMAL HIGH (ref 0.00–0.07)
Basophils Absolute: 0.1 10*3/uL (ref 0.0–0.1)
Basophils Relative: 1 %
Eosinophils Absolute: 0.1 10*3/uL (ref 0.0–0.5)
Eosinophils Relative: 1 %
HCT: 32.9 % — ABNORMAL LOW (ref 39.0–52.0)
Hemoglobin: 10.3 g/dL — ABNORMAL LOW (ref 13.0–17.0)
Immature Granulocytes: 4 %
Lymphocytes Relative: 13 %
Lymphs Abs: 1.4 10*3/uL (ref 0.7–4.0)
MCH: 29.9 pg (ref 26.0–34.0)
MCHC: 31.3 g/dL (ref 30.0–36.0)
MCV: 95.6 fL (ref 80.0–100.0)
Monocytes Absolute: 0.7 10*3/uL (ref 0.1–1.0)
Monocytes Relative: 7 %
Neutro Abs: 8 10*3/uL — ABNORMAL HIGH (ref 1.7–7.7)
Neutrophils Relative %: 74 %
Platelets: 193 10*3/uL (ref 150–400)
RBC: 3.44 MIL/uL — ABNORMAL LOW (ref 4.22–5.81)
RDW: 14.1 % (ref 11.5–15.5)
WBC: 10.8 10*3/uL — ABNORMAL HIGH (ref 4.0–10.5)
nRBC: 0.2 % (ref 0.0–0.2)

## 2020-11-02 LAB — COMPREHENSIVE METABOLIC PANEL
ALT: 28 U/L (ref 0–44)
AST: 18 U/L (ref 15–41)
Albumin: 2.2 g/dL — ABNORMAL LOW (ref 3.5–5.0)
Alkaline Phosphatase: 89 U/L (ref 38–126)
Anion gap: 12 (ref 5–15)
BUN: 8 mg/dL (ref 6–20)
CO2: 26 mmol/L (ref 22–32)
Calcium: 8.2 mg/dL — ABNORMAL LOW (ref 8.9–10.3)
Chloride: 98 mmol/L (ref 98–111)
Creatinine, Ser: 0.83 mg/dL (ref 0.61–1.24)
GFR, Estimated: >60 mL/min (ref >60)
Glucose, Bld: 98 mg/dL (ref 70–99)
Potassium: 3.2 mmol/L — ABNORMAL LOW (ref 3.5–5.1)
Sodium: 136 mmol/L (ref 135–145)
Total Bilirubin: 0.9 mg/dL (ref 0.3–1.2)
Total Protein: 6.9 g/dL (ref 6.5–8.1)

## 2020-11-02 LAB — D-DIMER, QUANTITATIVE: D-Dimer, Quant: 1.5 ug/mL-FEU — ABNORMAL HIGH (ref 0.00–0.50)

## 2020-11-02 LAB — BRAIN NATRIURETIC PEPTIDE: B Natriuretic Peptide: 57.8 pg/mL (ref 0.0–100.0)

## 2020-11-02 LAB — MAGNESIUM: Magnesium: 2.1 mg/dL (ref 1.7–2.4)

## 2020-11-02 LAB — C-REACTIVE PROTEIN: CRP: 11.4 mg/dL — ABNORMAL HIGH (ref <1.0)

## 2020-11-02 MED ORDER — POTASSIUM CHLORIDE CRYS ER 20 MEQ PO TBCR
40.0000 meq | EXTENDED_RELEASE_TABLET | Freq: Once | ORAL | Status: AC
  Administered 2020-11-02: 40 meq via ORAL
  Filled 2020-11-02: qty 2

## 2020-11-02 NOTE — Progress Notes (Signed)
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      Yes                                 +---------+---------------+---------+-----------+----------+--------------+ PTV      Full                                                        +---------+---------------+---------+-----------+----------+--------------+ PERO     Full                                                        +---------+---------------+---------+-----------+----------+--------------+     Summary: RIGHT: - There is no evidence of deep vein thrombosis in the lower extremity.  - No cystic structure found in the popliteal fossa.  LEFT: - There is no evidence of deep vein thrombosis in the lower extremity.  - No cystic  structure found in the popliteal fossa.   *See table(s) above for measurements and observations. Electronically signed by Ruta Hinds MD on 10/12/2020 at 10:00:37 AM.    Final

## 2020-11-03 LAB — CBC WITH DIFFERENTIAL/PLATELET
Abs Immature Granulocytes: 0.53 10*3/uL — ABNORMAL HIGH (ref 0.00–0.07)
Basophils Absolute: 0.1 10*3/uL (ref 0.0–0.1)
Basophils Relative: 1 %
Eosinophils Absolute: 0.1 10*3/uL (ref 0.0–0.5)
Eosinophils Relative: 1 %
HCT: 33.3 % — ABNORMAL LOW (ref 39.0–52.0)
Hemoglobin: 11.1 g/dL — ABNORMAL LOW (ref 13.0–17.0)
Immature Granulocytes: 5 %
Lymphocytes Relative: 13 %
Lymphs Abs: 1.5 10*3/uL (ref 0.7–4.0)
MCH: 31.4 pg (ref 26.0–34.0)
MCHC: 33.3 g/dL (ref 30.0–36.0)
MCV: 94.1 fL (ref 80.0–100.0)
Monocytes Absolute: 0.8 10*3/uL (ref 0.1–1.0)
Monocytes Relative: 7 %
Neutro Abs: 8.4 10*3/uL — ABNORMAL HIGH (ref 1.7–7.7)
Neutrophils Relative %: 73 %
Platelets: 211 10*3/uL (ref 150–400)
RBC: 3.54 MIL/uL — ABNORMAL LOW (ref 4.22–5.81)
RDW: 14.2 % (ref 11.5–15.5)
WBC: 11.4 10*3/uL — ABNORMAL HIGH (ref 4.0–10.5)
nRBC: 0 % (ref 0.0–0.2)

## 2020-11-03 LAB — COMPREHENSIVE METABOLIC PANEL
ALT: 27 U/L (ref 0–44)
AST: 22 U/L (ref 15–41)
Albumin: 2.3 g/dL — ABNORMAL LOW (ref 3.5–5.0)
Alkaline Phosphatase: 98 U/L (ref 38–126)
Anion gap: 10 (ref 5–15)
BUN: 8 mg/dL (ref 6–20)
CO2: 28 mmol/L (ref 22–32)
Calcium: 8.3 mg/dL — ABNORMAL LOW (ref 8.9–10.3)
Chloride: 99 mmol/L (ref 98–111)
Creatinine, Ser: 0.99 mg/dL (ref 0.61–1.24)
GFR, Estimated: >60 mL/min (ref >60)
Glucose, Bld: 105 mg/dL — ABNORMAL HIGH (ref 70–99)
Potassium: 4.2 mmol/L (ref 3.5–5.1)
Sodium: 137 mmol/L (ref 135–145)
Total Bilirubin: 1 mg/dL (ref 0.3–1.2)
Total Protein: 7.4 g/dL (ref 6.5–8.1)

## 2020-11-03 LAB — MAGNESIUM: Magnesium: 2.1 mg/dL (ref 1.7–2.4)

## 2020-11-03 LAB — D-DIMER, QUANTITATIVE: D-Dimer, Quant: 1.34 ug/mL-FEU — ABNORMAL HIGH (ref 0.00–0.50)

## 2020-11-03 LAB — C-REACTIVE PROTEIN: CRP: 9.4 mg/dL — ABNORMAL HIGH (ref <1.0)

## 2020-11-03 LAB — BRAIN NATRIURETIC PEPTIDE: B Natriuretic Peptide: 50.2 pg/mL (ref 0.0–100.0)

## 2020-11-03 MED ORDER — CLONAZEPAM 0.5 MG PO TABS
0.5000 mg | ORAL_TABLET | Freq: Three times a day (TID) | ORAL | Status: DC | PRN
Start: 2020-11-03 — End: 2020-11-06
  Administered 2020-11-03 – 2020-11-06 (×6): 0.5 mg via ORAL
  Filled 2020-11-03 (×7): qty 1

## 2020-11-03 NOTE — Plan of Care (Signed)
  Problem: Pain Managment: Goal: General experience of comfort will improve Outcome: Completed/Met

## 2020-11-03 NOTE — Progress Notes (Signed)
MD, pt's wife wanted u to get in touch with her about her husband's progress, her home # [phone], and her cell [phone], thanks Ransom.

## 2020-11-03 NOTE — Progress Notes (Signed)
Pt sat in chair from noon until approximately 6pm. Pt in bed. Labored breathing on exertion. NO other signs of distress noted. Pt encouraged to breathe through nose rather than mouth to optimized O2 intake. Pt is resting in bed comfortably. Will continue to monitor.

## 2020-11-03 NOTE — Progress Notes (Signed)
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                                 +---------+---------------+---------+-----------+----------+--------------+ PTV      Full                                                        +---------+---------------+---------+-----------+----------+--------------+ PERO     Full                                                        +---------+---------------+---------+-----------+----------+--------------+     Summary: RIGHT: - There is no evidence of deep vein thrombosis in the lower extremity.  - No cystic structure found in the popliteal fossa.  LEFT: - There is no evidence of deep vein thrombosis in the lower extremity.  - No cystic  structure found in the popliteal fossa.   *See table(s) above for measurements and observations. Electronically signed by Ruta Hinds MD on 10/12/2020 at 10:00:37 AM.    Final

## 2020-11-04 LAB — COMPREHENSIVE METABOLIC PANEL
ALT: 28 U/L (ref 0–44)
AST: 22 U/L (ref 15–41)
Albumin: 2.3 g/dL — ABNORMAL LOW (ref 3.5–5.0)
Alkaline Phosphatase: 95 U/L (ref 38–126)
Anion gap: 12 (ref 5–15)
BUN: 8 mg/dL (ref 6–20)
CO2: 26 mmol/L (ref 22–32)
Calcium: 8.4 mg/dL — ABNORMAL LOW (ref 8.9–10.3)
Chloride: 98 mmol/L (ref 98–111)
Creatinine, Ser: 0.91 mg/dL (ref 0.61–1.24)
GFR, Estimated: >60 mL/min (ref >60)
Glucose, Bld: 98 mg/dL (ref 70–99)
Potassium: 3.3 mmol/L — ABNORMAL LOW (ref 3.5–5.1)
Sodium: 136 mmol/L (ref 135–145)
Total Bilirubin: 0.8 mg/dL (ref 0.3–1.2)
Total Protein: 7.4 g/dL (ref 6.5–8.1)

## 2020-11-04 LAB — CBC WITH DIFFERENTIAL/PLATELET
Abs Immature Granulocytes: 0.52 10*3/uL — ABNORMAL HIGH (ref 0.00–0.07)
Basophils Absolute: 0.1 10*3/uL (ref 0.0–0.1)
Basophils Relative: 1 %
Eosinophils Absolute: 0.1 10*3/uL (ref 0.0–0.5)
Eosinophils Relative: 1 %
HCT: 33.1 % — ABNORMAL LOW (ref 39.0–52.0)
Hemoglobin: 10.6 g/dL — ABNORMAL LOW (ref 13.0–17.0)
Immature Granulocytes: 4 %
Lymphocytes Relative: 15 %
Lymphs Abs: 1.8 10*3/uL (ref 0.7–4.0)
MCH: 30.5 pg (ref 26.0–34.0)
MCHC: 32 g/dL (ref 30.0–36.0)
MCV: 95.1 fL (ref 80.0–100.0)
Monocytes Absolute: 0.8 10*3/uL (ref 0.1–1.0)
Monocytes Relative: 6 %
Neutro Abs: 8.4 10*3/uL — ABNORMAL HIGH (ref 1.7–7.7)
Neutrophils Relative %: 73 %
Platelets: 213 10*3/uL (ref 150–400)
RBC: 3.48 MIL/uL — ABNORMAL LOW (ref 4.22–5.81)
RDW: 14.3 % (ref 11.5–15.5)
WBC: 11.7 10*3/uL — ABNORMAL HIGH (ref 4.0–10.5)
nRBC: 0.2 % (ref 0.0–0.2)

## 2020-11-04 LAB — C-REACTIVE PROTEIN: CRP: 9.3 mg/dL — ABNORMAL HIGH (ref <1.0)

## 2020-11-04 LAB — MAGNESIUM: Magnesium: 2 mg/dL (ref 1.7–2.4)

## 2020-11-04 LAB — BRAIN NATRIURETIC PEPTIDE: B Natriuretic Peptide: 68.2 pg/mL (ref 0.0–100.0)

## 2020-11-04 LAB — D-DIMER, QUANTITATIVE: D-Dimer, Quant: 1.33 ug/mL-FEU — ABNORMAL HIGH (ref 0.00–0.50)

## 2020-11-04 MED ORDER — POLYETHYLENE GLYCOL 3350 17 G PO PACK: 17.0000 g | PACK | Freq: Every day | ORAL | Status: DC | PRN

## 2020-11-04 MED ORDER — LOPERAMIDE HCL 2 MG PO CAPS
2.0000 mg | ORAL_CAPSULE | Freq: Once | ORAL | Status: AC
  Administered 2020-11-04: 2 mg via ORAL
  Filled 2020-11-04: qty 1

## 2020-11-04 MED ORDER — ENOXAPARIN SODIUM 60 MG/0.6ML ~~LOC~~ SOLN
60.0000 mg | Freq: Two times a day (BID) | SUBCUTANEOUS | Status: DC
  Administered 2020-11-04 – 2020-11-10 (×13): 60 mg via SUBCUTANEOUS
  Filled 2020-11-04 (×13): qty 0.6

## 2020-11-04 MED ORDER — POTASSIUM CHLORIDE CRYS ER 20 MEQ PO TBCR
40.0000 meq | EXTENDED_RELEASE_TABLET | Freq: Once | ORAL | Status: AC
  Administered 2020-11-04: 40 meq via ORAL
  Filled 2020-11-04: qty 2
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
## 2020-11-04 NOTE — Progress Notes (Signed)
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           Yes      Yes                                 +---------+---------------+---------+-----------+----------+--------------+ PTV      Full                                                        +---------+---------------+---------+-----------+----------+--------------+ PERO     Full                                                        +---------+---------------+---------+-----------+----------+--------------+     Summary: RIGHT: - There is no evidence of deep vein thrombosis in the lower extremity.  - No cystic structure found in the popliteal fossa.  LEFT: - There is no evidence of deep vein thrombosis in the lower  extremity.  - No  cystic structure found in the popliteal fossa.  *See table(s) above for measurements and observations. Electronically signed by Ruta Hinds MD on 10/12/2020 at 10:00:37 AM.    Final

## 2020-11-04 NOTE — Plan of Care (Signed)
  Problem: Skin Integrity: Goal: Risk for impaired skin integrity will decrease Outcome: Completed/Met

## 2020-11-04 NOTE — TOC Progression Note (Signed)
Transition of Care [facility]) - Progression Note    Patient Details  Name: Matthew [name] MRN: [id number] Date of Birth: 24-Nov-1965  [facility] CM/SW Contact  Zenon Mayo, RN Phone Number: 11/04/2020, 6:14 PM  Clinical Narrative:    from home, COVID, on 12 liters HFNC, no insurance. [employer] following for charity oxygen, will need [facility] and [facility] [place]  pharmacy for meds. May need charity HHPT also.  TOC will continue to follow.   Expected Discharge Plan: Home/Self Care Barriers to Discharge: Continued Medical Work up  Expected Discharge Plan and Services Expected Discharge Plan: Home/Self Care   Discharge Planning Services: CM Consult Post Acute Care Choice: Durable Medical Equipment Living arrangements for the past 2 months: Single Family Home                                       Social Determinants of Health (SDOH) Interventions    Readmission Risk Interventions No flowsheet data found.

## 2020-11-05 LAB — CBC WITH DIFFERENTIAL/PLATELET
Abs Immature Granulocytes: 0.56 10*3/uL — ABNORMAL HIGH (ref 0.00–0.07)
Basophils Absolute: 0.1 10*3/uL (ref 0.0–0.1)
Basophils Relative: 1 %
Eosinophils Absolute: 0.2 10*3/uL (ref 0.0–0.5)
Eosinophils Relative: 1 %
HCT: 33.8 % — ABNORMAL LOW (ref 39.0–52.0)
Hemoglobin: 10.9 g/dL — ABNORMAL LOW (ref 13.0–17.0)
Immature Granulocytes: 5 %
Lymphocytes Relative: 17 %
Lymphs Abs: 1.9 10*3/uL (ref 0.7–4.0)
MCH: 31 pg (ref 26.0–34.0)
MCHC: 32.2 g/dL (ref 30.0–36.0)
MCV: 96 fL (ref 80.0–100.0)
Monocytes Absolute: 0.8 10*3/uL (ref 0.1–1.0)
Monocytes Relative: 7 %
Neutro Abs: 8.2 10*3/uL — ABNORMAL HIGH (ref 1.7–7.7)
Neutrophils Relative %: 69 %
Platelets: 214 10*3/uL (ref 150–400)
RBC: 3.52 MIL/uL — ABNORMAL LOW (ref 4.22–5.81)
RDW: 14.5 % (ref 11.5–15.5)
WBC: 11.7 10*3/uL — ABNORMAL HIGH (ref 4.0–10.5)
nRBC: 0.2 % (ref 0.0–0.2)

## 2020-11-05 LAB — C-REACTIVE PROTEIN: CRP: 8.4 mg/dL — ABNORMAL HIGH (ref <1.0)

## 2020-11-05 LAB — COMPREHENSIVE METABOLIC PANEL
ALT: 32 U/L (ref 0–44)
AST: 26 U/L (ref 15–41)
Albumin: 2.3 g/dL — ABNORMAL LOW (ref 3.5–5.0)
Alkaline Phosphatase: 96 U/L (ref 38–126)
Anion gap: 11 (ref 5–15)
BUN: 10 mg/dL (ref 6–20)
CO2: 27 mmol/L (ref 22–32)
Calcium: 8.6 mg/dL — ABNORMAL LOW (ref 8.9–10.3)
Chloride: 100 mmol/L (ref 98–111)
Creatinine, Ser: 1.01 mg/dL (ref 0.61–1.24)
GFR, Estimated: >60 mL/min (ref >60)
Glucose, Bld: 97 mg/dL (ref 70–99)
Potassium: 3.7 mmol/L (ref 3.5–5.1)
Sodium: 138 mmol/L (ref 135–145)
Total Bilirubin: 0.7 mg/dL (ref 0.3–1.2)
Total Protein: 7.6 g/dL (ref 6.5–8.1)

## 2020-11-05 LAB — MAGNESIUM: Magnesium: 2.2 mg/dL (ref 1.7–2.4)

## 2020-11-05 LAB — BRAIN NATRIURETIC PEPTIDE: B Natriuretic Peptide: 51.6 pg/mL (ref 0.0–100.0)

## 2020-11-05 LAB — D-DIMER, QUANTITATIVE: D-Dimer, Quant: 1.53 ug/mL-FEU — ABNORMAL HIGH (ref 0.00–0.50)

## 2020-11-05 MED ORDER — LOPERAMIDE HCL 2 MG PO CAPS
4.0000 mg | ORAL_CAPSULE | Freq: Once | ORAL | Status: AC
  Administered 2020-11-05: 4 mg via ORAL
  Filled 2020-11-05: qty 2

## 2020-11-05 MED ORDER — LOPERAMIDE HCL 2 MG PO CAPS
2.0000 mg | ORAL_CAPSULE | Freq: Three times a day (TID) | ORAL | Status: DC | PRN
  Administered 2020-11-09: 2 mg via ORAL
  Filled 2020-11-05: qty 1

## 2020-11-05 NOTE — Progress Notes (Signed)
Physical Therapy Treatment Patient Details Name: Matthew [name] MRN: [id number] DOB: 1965-11-13 Today's Date: 11/05/2020    History of Present Illness Pt is a 55 y.o. male admitted 09/26/20 with SOB and GI distress (had declined COVID test when seen in urgent care). Pt now testing (+) COVID-19. Workup for acute hypoxic respiratory failure due to acute COVID-19 viral PNA. Course complicated by hemoptysis, worsening hypoxemis requiring HHFNC; pt unable to lay flat for chest CTA. Negative DVT on 12/31. Repeat CXR 1/10 with diffuse bilateral airspace disease. PMH includes CAD, CHF, tobacco use (quit 11/2019); pt unvaccinated.    PT Comments    Pt remains difficult to motivate to participate. Pt received with bowel incontinence sitting up in chair, however, required ~25 minutes to convince to stand up in order to get cleaned up. Pt also refused to transfer onto a bedside commode. Eventually, PT/RN/NT able to convince pt to transfer out of the chair in order to provide peri care. Pt requiring min assist to stand and totalA for peri care. Desaturation to 62% on 11L HFNC; increased to 13L where he eventually rebounded to 88% after sitting back down. Will likely need post acute rehab.    Follow Up Recommendations  SNF;LTACH     Equipment Recommendations  3in1 (PT);Wheelchair (measurements PT)    Recommendations for Other Services       Precautions / Restrictions Precautions Precautions: Fall;Other (comment) Precaution Comments: anxiety; watch SpO2 on HFNC Restrictions Weight Bearing Restrictions: No    Mobility  Bed Mobility               General bed mobility comments: OOB in chair  Transfers Overall transfer level: Needs assistance Equipment used: 1 person hand held assist Transfers: Sit to/from Stand Sit to Stand: Min assist;+2 safety/equipment         General transfer comment: MinA to boost up to stand, pt performing ~45 degree turn to bed to lean on with forearms  during peri care.  Ambulation/Gait                 Stairs             Wheelchair Mobility    Modified Rankin (Stroke Patients Only)       Balance Overall balance assessment: Needs assistance Sitting-balance support: Feet supported Sitting balance-Leahy Scale: Good     Standing balance support: During functional activity;Single extremity supported Standing balance-Leahy Scale: Poor Standing balance comment: Reliant on UE support                            Cognition Arousal/Alertness: Awake/alert Behavior During Therapy: Flat affect;Agitated Overall Cognitive Status: Within Functional Limits for tasks assessed                                        Exercises      General Comments        Pertinent Vitals/Pain Pain Assessment: Faces Faces Pain Scale: No hurt    Home Living                      Prior Function            PT Goals (current goals can now be found in the care plan section) Acute Rehab PT Goals Patient Stated Goal: control of bowel movements Potential to Achieve Goals: Fair Progress towards  PT goals: Progressing toward goals    Frequency    Min 3X/week      PT Plan Current plan remains appropriate    Co-evaluation              AM-PAC PT "6 Clicks" Mobility   Outcome Measure  Help needed turning from your back to your side while in a flat bed without using bedrails?: None Help needed moving from lying on your back to sitting on the side of a flat bed without using bedrails?: None Help needed moving to and from a bed to a chair (including a wheelchair)?: A Little Help needed standing up from a chair using your arms (e.g., wheelchair or bedside chair)?: A Little Help needed to walk in hospital room?: A Lot Help needed climbing 3-5 steps with a railing? : Total 6 Click Score: 17    End of Session Equipment Utilized During Treatment: Oxygen Activity Tolerance: Other (comment)  (limited by bowel incontinence, self limiting) Patient left: in chair;with call bell/phone within reach Nurse Communication: Mobility status PT Visit Diagnosis: Other abnormalities of gait and mobility (R26.89)     Time: 6433-2951 PT Time Calculation (min) (ACUTE ONLY): 48 min  Charges:  $Therapeutic Activity: 38-52 mins                     Wyona Almas, PT, DPT Acute Rehabilitation Services Pager [phone] Office [phone]    Deno Etienne 11/05/2020, 5:17 PM

## 2020-11-05 NOTE — Progress Notes (Signed)
Patient is up in chair after several attempts with desats issues. Patient O2 sat drops to 65% on 10L during transport to chair, patient also having anxiety attack. Patient requesting RN to open oxygen wide so he can catch his breath.  Oxygen increase to max of 15L with o2 sats improving to the 80's to enable transport.

## 2020-11-05 NOTE — Progress Notes (Addendum)
RN walk in room and patient having trouble breathing,O2 had been turn down from 7 to 5L and sating at 72% and he felt like he was having a panic attack. Per patient the MD turn it down. O2 tirn up to 10 L, sat now 92%. Anxiety med given as well.

## 2020-11-05 NOTE — Progress Notes (Signed)
Pt on 10L HFNC, desats to high 70's low 80's with minimal movement/repositioning in bed (turning over, sitting up, sliding in the bed). Pt turned up to 13L HFNC to catch breath. Turned back down to 10L  once back in 90's. Pt does not desat while sleeping but does upon any minimal movement when turning in bed. Pt does not want to get out of bed to ambulate, and is tired from today and experiencing anxiety. Will give PRN Klonopin when next dose is available

## 2020-11-05 NOTE — Progress Notes (Signed)
Patient now on 7L sat is 96%.
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
## 2020-11-05 NOTE — Progress Notes (Signed)
Patient wife very upset about imodium not been schedule. It should have been schedule from yesterday. According to her the MD told her he was going to schedule it. RN tried to explain that we are avoiding him getting constipated but wife just blew out yelling and walked out saying she's sick and tire of [place] not knowing what to do. MD Candiss Norse made aware, give verbal order for 2 mg Q 8 hrs PRN.

## 2020-11-06 LAB — CBC WITH DIFFERENTIAL/PLATELET
Abs Immature Granulocytes: 0.57 10*3/uL — ABNORMAL HIGH (ref 0.00–0.07)
Basophils Absolute: 0.1 10*3/uL (ref 0.0–0.1)
Basophils Relative: 1 %
Eosinophils Absolute: 0.2 10*3/uL (ref 0.0–0.5)
Eosinophils Relative: 1 %
HCT: 32.5 % — ABNORMAL LOW (ref 39.0–52.0)
Hemoglobin: 10.3 g/dL — ABNORMAL LOW (ref 13.0–17.0)
Immature Granulocytes: 5 %
Lymphocytes Relative: 14 %
Lymphs Abs: 1.8 10*3/uL (ref 0.7–4.0)
MCH: 30.6 pg (ref 26.0–34.0)
MCHC: 31.7 g/dL (ref 30.0–36.0)
MCV: 96.4 fL (ref 80.0–100.0)
Monocytes Absolute: 0.7 10*3/uL (ref 0.1–1.0)
Monocytes Relative: 6 %
Neutro Abs: 9.1 10*3/uL — ABNORMAL HIGH (ref 1.7–7.7)
Neutrophils Relative %: 73 %
Platelets: 223 10*3/uL (ref 150–400)
RBC: 3.37 MIL/uL — ABNORMAL LOW (ref 4.22–5.81)
RDW: 14.6 % (ref 11.5–15.5)
WBC: 12.4 10*3/uL — ABNORMAL HIGH (ref 4.0–10.5)
nRBC: 0.2 % (ref 0.0–0.2)

## 2020-11-06 LAB — COMPREHENSIVE METABOLIC PANEL
ALT: 31 U/L (ref 0–44)
AST: 27 U/L (ref 15–41)
Albumin: 2.4 g/dL — ABNORMAL LOW (ref 3.5–5.0)
Alkaline Phosphatase: 95 U/L (ref 38–126)
Anion gap: 11 (ref 5–15)
BUN: 10 mg/dL (ref 6–20)
CO2: 27 mmol/L (ref 22–32)
Calcium: 8.5 mg/dL — ABNORMAL LOW (ref 8.9–10.3)
Chloride: 99 mmol/L (ref 98–111)
Creatinine, Ser: 1.06 mg/dL (ref 0.61–1.24)
GFR, Estimated: >60 mL/min (ref >60)
Glucose, Bld: 99 mg/dL (ref 70–99)
Potassium: 3.9 mmol/L (ref 3.5–5.1)
Sodium: 137 mmol/L (ref 135–145)
Total Bilirubin: 0.6 mg/dL (ref 0.3–1.2)
Total Protein: 7.6 g/dL (ref 6.5–8.1)

## 2020-11-06 LAB — BRAIN NATRIURETIC PEPTIDE: B Natriuretic Peptide: 39.9 pg/mL (ref 0.0–100.0)

## 2020-11-06 LAB — MAGNESIUM: Magnesium: 2.1 mg/dL (ref 1.7–2.4)

## 2020-11-06 MED ORDER — ALBUTEROL SULFATE (2.5 MG/3ML) 0.083% IN NEBU
INHALATION_SOLUTION | RESPIRATORY_TRACT | Status: AC
  Administered 2020-11-06: 2.5 mg
  Filled 2020-11-06: qty 3

## 2020-11-06 MED ORDER — CLONAZEPAM 0.5 MG PO TABS
0.5000 mg | ORAL_TABLET | Freq: Two times a day (BID) | ORAL | Status: DC | PRN
  Administered 2020-11-06 – 2020-11-09 (×5): 0.5 mg via ORAL
  Filled 2020-11-06 (×6): qty 1

## 2020-11-06 MED ORDER — ALBUTEROL SULFATE (2.5 MG/3ML) 0.083% IN NEBU: 2.5000 mg | INHALATION_SOLUTION | RESPIRATORY_TRACT | Status: DC | PRN

## 2020-11-06 NOTE — Progress Notes (Signed)
100% on NRB, pt states coughign has improved after breathing treatment.

## 2020-11-06 NOTE — Progress Notes (Signed)
Pt SPO2 70-80s on HFNC 13L will continue to monitor. Pt states he feels very anxious educated pt on breathing techniques and relaxation. Any sort on movement or coughing pt desats.

## 2020-11-06 NOTE — Progress Notes (Signed)
Pt returned to bed. Placed on 13L HFNC

## 2020-11-06 NOTE — Progress Notes (Signed)
Occupational Therapy Treatment Patient Details Name: Matthew [name] MRN: [id number] DOB: 04-19-1966 Today's Date: 11/06/2020    History of present illness Pt is a 55 y.o. male admitted 09/26/20 with SOB and GI distress (had declined COVID test when seen in urgent care). Pt now testing (+) COVID-19. Workup for acute hypoxic respiratory failure due to acute COVID-19 viral PNA. Course complicated by hemoptysis, worsening hypoxemis requiring HHFNC; pt unable to lay flat for chest CTA. Negative DVT on 12/31. Repeat CXR 1/10 with diffuse bilateral airspace disease. PMH includes CAD, CHF, tobacco use (quit 11/2019); pt unvaccinated.   OT comments  Pt making slow progress towards OT goals. Pt remains very anxious prior to movement. Educated Pt that anxiety is mental health disorder and we need to "re-wire" his brain so that he has positive experiences so when he does move and perform activities he starts to feel confident and not scared "Like I am going to die". Educated on techniques and breathing exercises. Talked about fishing and his great niece who likes [employer] to distract him. Pt min A for transfers today, able to perform LB dressing, eating, and grooming tasks at min guard. Pt on 15L via NRB (Pt is a "mouth breather") and SpO2 remained >90% throughout - mostly right around the 92% mark. HR between 90-105. OT will continue to follow acutely.   Follow Up Recommendations  LTACH;SNF    Equipment Recommendations  Tub/shower bench    Recommendations for Other Services      Precautions / Restrictions Precautions Precautions: Fall;Other (comment) Precaution Comments: anxiety; watch SpO2 on NRB Restrictions Weight Bearing Restrictions: No       Mobility Bed Mobility               General bed mobility comments: OOB in chair  Transfers Overall transfer level: Needs assistance Equipment used: 1 person hand held assist Transfers: Sit to/from Stand Sit to Stand: Min assist          General transfer comment: Pt for comfort holding OT hand for boost into standing, able to stand and take 10 "marching steps" prior to return to sitting x2    Balance Overall balance assessment: Needs assistance Sitting-balance support: Feet supported Sitting balance-Leahy Scale: Good     Standing balance support: During functional activity;Single extremity supported Standing balance-Leahy Scale: Poor Standing balance comment: Reliant on UE support                           ADL either performed or assessed with clinical judgement   ADL Overall ADL's : Needs assistance/impaired Eating/Feeding: Set up;Sitting   Grooming: Brushing hair;Sitting;Set up                   Toilet Transfer: Minimal assistance;BSC;Stand-pivot Toilet Transfer Details (indicate cue type and reason): 1 person HHA for balance         Functional mobility during ADLs: Minimal assistance (1 person HHA for SPT only) General ADL Comments: Pt continues to struggle with anxiety, requires increased time and encouragement     Vision       Perception     Praxis      Cognition Arousal/Alertness: Awake/alert Behavior During Therapy: WFL for tasks assessed/performed Overall Cognitive Status: Within Functional Limits for tasks assessed                                 General Comments:  new anxiety since COVID, struggles with not wanting to "trigger" anxiety        Exercises Other Exercises Other Exercises: practicing slow, deep breathing techniques. x10 reps (x2 sets) Other Exercises: reviewed relaxation strategies to reduce feelings of anxiety   Shoulder Instructions       General Comments Edcuated Pt on anxiety and needing to focus on creating new and positive experiences so his brain can "re-map" and to tell himself that he can do it. Anxiety is new since COVID    Pertinent Vitals/ Pain       Pain Assessment: Faces Faces Pain Scale: No hurt Pain Intervention(s):  Monitored during session;Repositioned  Home Living                                          Prior Functioning/Environment              Frequency  Min 3X/week        Progress Toward Goals  OT Goals(current goals can now be found in the care plan section)  Progress towards OT goals: Progressing toward goals  Acute Rehab OT Goals Patient Stated Goal: long term goal: get back to fishing; short term goal: get home to wife OT Goal Formulation: With patient Time For Goal Achievement: 11/20/20 Potential to Achieve Goals: Good  Plan Discharge plan remains appropriate;Frequency remains appropriate    Co-evaluation                 AM-PAC OT "6 Clicks" Daily Activity     Outcome Measure   Help from another person eating meals?: A Little Help from another person taking care of personal grooming?: A Little Help from another person toileting, which includes using toliet, bedpan, or urinal?: A Little Help from another person bathing (including washing, rinsing, drying)?: A Little Help from another person to put on and taking off regular upper body clothing?: A Little Help from another person to put on and taking off regular lower body clothing?: A Little 6 Click Score: 18    End of Session Equipment Utilized During Treatment: Oxygen (15L via NRB)  OT Visit Diagnosis: Unsteadiness on feet (R26.81);Other abnormalities of gait and mobility (R26.89);Muscle weakness (generalized) (M62.81);Dizziness and giddiness (R42)   Activity Tolerance Patient tolerated treatment well   Patient Left in chair;with call bell/phone within reach   Nurse Communication Mobility status        Time: 6659-9357 OT Time Calculation (min): 31 min  Charges: OT General Charges $OT Visit: 1 Visit OT Treatments $Self Care/Home Management : 8-22 mins $Therapeutic Activity: 8-22 mins  Jesse Sans OTR/L Acute Rehabilitation Services Pager: [phone] Office:  [facility] 11/06/2020, 4:29 PM

## 2020-11-06 NOTE — Progress Notes (Signed)
RT called to give treatment and assess patient due to desaturation on 15L [place].  RT gave albuterol Neb with some relief. Patient stated his nose was stopped up so RT placed NRB and sats maintained at 98%. RT made RN aware. RT Will continue to monitor.

## 2020-11-06 NOTE — Progress Notes (Signed)
[facility] PROGRESS NOTE    Progress Note  Matthew [name]  FVC:[id number] DOB: May 24, 1966 DOA: 09/26/2020 PCP: Patient, No Pcp Per     Brief Narrative:   Matthew [name] is an 55 y.o. male past medical history significant of CAD status post PCI tobacco abuse hyperlipidemia comes into the hospital with acute respiratory failure due to COVID-19 requiring 100% nonrebreather  COVID-19 vaccine status: Unvaccinated  Significant Events: 12/9>> started having GI symptoms including diarrhea 12/13>> to urgent care-for diarrhea-discharged after supportive care/IV fluids 12/16>> Admit to [facility] for hypoxia due to COVID-19 pneumonia-requiring 100% O2 via NRB 12/28>> hemoptysis-PCCM consulted-supportive care recommended. 12/31>> worsening hypoxemia-started on heated high flow-unable to lie flat for a CTA chest  Significant studies: 12/17>> CTA chest: No PE, diffuse groundglass opacities 12/18>> Echo: EF 60-65% 12/26>> chest x-ray: Diffuse bilateral heterogeneous/interstitial airspace opacities 12/31>> chest x-ray: Stable bilateral lung opacities 12/31>> lower extremity Doppler: No DVT 1/5>> chest x-ray: Mild improvement in multifocal opacities 1/10>> chest x-ray: Diffuse bilateral airspace disease 1/15>> chest x-ray: Persistent diffuse bilateral hazy opacities-similar/slightly improved from prior study   COVID-19 medications: Steroids: 12/16>>1/15 Remdesivir: 12/16>> 12/20 Baricitinib: 12/17>>12/30  Antibiotics: None  Microbiology data: None  Procedures: None  Consults: PCCM  Assessment/Plan:   Acute respiratory failure with hypoxia due to COVID-19 viral pneumonia now in ARDS: Unfortunately now with severe lung injury due to COVID-19 leading to ARDS. He has completed his treatment of IV remdesivir baricitinib's and steroids. He has completed his isolation.Marland Kitchen He still requiring 60 L of oxygen nasal cannula to try to keep saturations greater than 90%. He is  reluctant to do pulmonary toileting. Encourage patient to sit up in chair continue work with physical therapy continues into spirometry and flutter valve. He has been extremely noncompliant and reluctant to move around the bed and cooperate with staff and treatment. He probably has undiagnosed sleep apnea that is why he keeps the satting at night. This morning just with his speaking to me he desatted to 84% he is now on 15 L of high flow nasal cannula. We need to stress to the patient that the benzodiazepines are not treating his hypoxia as the oxygen he is fixated on that the benzos are used to treat his hypoxia.  Oxygen requirements: SpO2: 93% FiO2 flow rate of 6 L/min With an FiO2 of 50%  Elevated D-dimer: CTA was negative x2 along with lower extremity Doppler. Continue Lovenox for DVT prophylaxis.  Acute on chronic diastolic heart failure: Appears to be compensated. He has been adequately diuresed.    Hemoptysis: 1 episode on 10/08/2020 now resolved.  Hypotension: Now on low-dose midodrine currently asymptomatic.  Transaminitis: Likely due to COVID-19 if continue to improve.  CAD status post PCI: No anginal symptoms on antiplatelet therapy at home.  GERD: Continue PPI.  DVT prophylaxis: lovenox Family Communication:wife Status is: Inpatient  Remains inpatient appropriate because:Hemodynamically unstable   Dispo: The patient is from: Home              Anticipated d/c is to: Home              Anticipated d/c date is: > 3 days              Patient currently is not medically stable to d/c.   Difficult to place patient No        Code Status:     Code Status Orders  (From admission, onward)         Start  Ordered   09/26/20 2219  Full code  Continuous        09/26/20 2222        Code Status History    Date Active Date Inactive Code Status Order ID Comments User Context   11/17/2019 3614 11/20/2019 1735 Full Code [id number]  Laurin Coder, MD  Inpatient   Advance Care Planning Activity        IV Access:    Peripheral IV   Procedures and diagnostic studies:   No results found.   Medical Consultants:    None.  Anti-Infectives:   none  Subjective:    Matthew [name] he relates he is very anxious and he continues to desat overnight.   Objective:    Vitals:   11/05/20 1957 11/06/20 0058 11/06/20 0334 11/06/20 0735  BP: (!) 101/58 96/75 92/71  102/66  Pulse: 81 71 70 74  Resp: 16 18 18 20   Temp: 97.9 F (36.6 C) 97.9 F (36.6 C) 97.7 F (36.5 C) 98.5 F (36.9 C)  TempSrc: Oral Oral Oral Oral  SpO2: 94% 95% 99% (!) 81%  Weight:   92.1 kg   Height:       SpO2: (!) 81 % O2 Flow Rate (L/min): 15 L/min FiO2 (%): 50 %   Intake/Output Summary (Last 24 hours) at 11/06/2020 0746 Last data filed at 11/05/2020 1743 Gross per 24 hour  Intake 840 ml  Output 900 ml  Net -60 ml   Filed Weights   11/04/20 0600 11/05/20 0354 11/06/20 0334  Weight: 91.2 kg 91.6 kg 92.1 kg    Exam: General exam: In no acute distress. Respiratory system: Good air movement and dry crackles bilaterally Cardiovascular system: S1 & S2 heard, RRR. No JVD. Gastrointestinal system: Abdomen is nondistended, soft and nontender.  Extremities: No pedal edema. Skin: No rashes, lesions or ulcers Psychiatry: Judgement and insight appear normal. Mood & affect appropriate.    Data Reviewed:    Labs: Basic Metabolic Panel: Recent Labs  Lab 11/02/20 0500 11/03/20 0358 11/04/20 0309 11/05/20 0423 11/06/20 0302  NA 136 137 136 138 137  K 3.2* 4.2 3.3* 3.7 3.9  CL 98 99 98 100 99  CO2 26 28 26 27 27   GLUCOSE 98 105* 98 97 99  BUN 8 8 8 10 10   CREATININE 0.83 0.99 0.91 1.01 1.06  CALCIUM 8.2* 8.3* 8.4* 8.6* 8.5*  MG 2.1 2.1 2.0 2.2 2.1   GFR Estimated Creatinine Clearance: 90.3 mL/min (by C-G formula based on SCr of 1.06 mg/dL). Liver Function Tests: Recent Labs  Lab 11/02/20 0500 11/03/20 0358 11/04/20 0309  11/05/20 0423 11/06/20 0302  AST 18 22 22 26 27   ALT 28 27 28  32 31  ALKPHOS 89 98 95 96 95  BILITOT 0.9 1.0 0.8 0.7 0.6  PROT 6.9 7.4 7.4 7.6 7.6  ALBUMIN 2.2* 2.3* 2.3* 2.3* 2.4*   No results for input(s): LIPASE, AMYLASE in the last 168 hours. No results for input(s): AMMONIA in the last 168 hours. Coagulation profile No results for input(s): INR, PROTIME in the last 168 hours. COVID-19 Labs  Recent Labs    11/04/20 0309 11/05/20 0423  DDIMER 1.33* 1.53*  CRP 9.3* 8.4*    Lab Results  Component Value Date   SARSCOV2NAA POSITIVE (A) 09/26/2020   SARSCOV2NAA NEGATIVE 02/06/2020   [place] NEGATIVE 11/17/2019    CBC: Recent Labs  Lab 11/02/20 0500 11/03/20 0358 11/04/20 0309 11/05/20 0423 11/06/20 0302  WBC 10.8* 11.4* 11.7* 11.7* 12.4*  NEUTROABS 8.0* 8.4* 8.4* 8.2* 9.1*  HGB 10.3* 11.1* 10.6* 10.9* 10.3*  HCT 32.9* 33.3* 33.1* 33.8* 32.5*  MCV 95.6 94.1 95.1 96.0 96.4  PLT 193 211 213 214 223   Cardiac Enzymes: No results for input(s): CKTOTAL, CKMB, CKMBINDEX, TROPONINI in the last 168 hours. BNP (last 3 results) No results for input(s): PROBNP in the last 8760 hours. CBG: No results for input(s): GLUCAP in the last 168 hours. D-Dimer: Recent Labs    11/04/20 0309 11/05/20 0423  DDIMER 1.33* 1.53*   Hgb A1c: No results for input(s): HGBA1C in the last 72 hours. Lipid Profile: No results for input(s): CHOL, HDL, LDLCALC, TRIG, CHOLHDL, LDLDIRECT in the last 72 hours. Thyroid function studies: No results for input(s): TSH, T4TOTAL, T3FREE, THYROIDAB in the last 72 hours.  Invalid input(s): FREET3 Anemia work up: No results for input(s): VITAMINB12, FOLATE, FERRITIN, TIBC, IRON, RETICCTPCT in the last 72 hours. Sepsis Labs: Recent Labs  Lab 11/03/20 0358 11/04/20 0309 11/05/20 0423 11/06/20 0302  WBC 11.4* 11.7* 11.7* 12.4*   Microbiology No results found for this or any previous visit (from the past 240 hour(s)).   Medications:    . benzonatate  200 mg Oral TID  . enoxaparin (LOVENOX) injection  60 mg Subcutaneous Q12H  . escitalopram  20 mg Oral Daily  . feeding supplement  1 Container Oral BID BM  . melatonin  3 mg Oral QHS  . midodrine  10 mg Oral BID WC  . multivitamins with iron  1 tablet Oral Daily  . pantoprazole  40 mg Oral Daily  . sodium chloride flush  3 mL Intravenous Q12H   Continuous Infusions:    LOS: 41 days   Charlynne Cousins  [facility]  11/06/2020, 7:46 AM

## 2020-11-06 NOTE — Progress Notes (Signed)
Pt. resting on NRB Sp02 100% will continue to monitor.

## 2020-11-07 LAB — COMPREHENSIVE METABOLIC PANEL
ALT: 26 U/L (ref 0–44)
AST: 23 U/L (ref 15–41)
Albumin: 2.3 g/dL — ABNORMAL LOW (ref 3.5–5.0)
Alkaline Phosphatase: 95 U/L (ref 38–126)
Anion gap: 11 (ref 5–15)
BUN: 10 mg/dL (ref 6–20)
CO2: 27 mmol/L (ref 22–32)
Calcium: 8.3 mg/dL — ABNORMAL LOW (ref 8.9–10.3)
Chloride: 99 mmol/L (ref 98–111)
Creatinine, Ser: 1 mg/dL (ref 0.61–1.24)
GFR, Estimated: >60 mL/min (ref >60)
Glucose, Bld: 108 mg/dL — ABNORMAL HIGH (ref 70–99)
Potassium: 3.5 mmol/L (ref 3.5–5.1)
Sodium: 137 mmol/L (ref 135–145)
Total Bilirubin: 1 mg/dL (ref 0.3–1.2)
Total Protein: 7.7 g/dL (ref 6.5–8.1)

## 2020-11-07 LAB — CBC WITH DIFFERENTIAL/PLATELET
Abs Immature Granulocytes: 0.43 10*3/uL — ABNORMAL HIGH (ref 0.00–0.07)
Basophils Absolute: 0.1 10*3/uL (ref 0.0–0.1)
Basophils Relative: 1 %
Eosinophils Absolute: 0.2 10*3/uL (ref 0.0–0.5)
Eosinophils Relative: 2 %
HCT: 31.2 % — ABNORMAL LOW (ref 39.0–52.0)
Hemoglobin: 9.8 g/dL — ABNORMAL LOW (ref 13.0–17.0)
Immature Granulocytes: 3 %
Lymphocytes Relative: 11 %
Lymphs Abs: 1.5 10*3/uL (ref 0.7–4.0)
MCH: 30.3 pg (ref 26.0–34.0)
MCHC: 31.4 g/dL (ref 30.0–36.0)
MCV: 96.6 fL (ref 80.0–100.0)
Monocytes Absolute: 0.9 10*3/uL (ref 0.1–1.0)
Monocytes Relative: 7 %
Neutro Abs: 10 10*3/uL — ABNORMAL HIGH (ref 1.7–7.7)
Neutrophils Relative %: 76 %
Platelets: 207 10*3/uL (ref 150–400)
RBC: 3.23 MIL/uL — ABNORMAL LOW (ref 4.22–5.81)
RDW: 14.6 % (ref 11.5–15.5)
WBC: 13 10*3/uL — ABNORMAL HIGH (ref 4.0–10.5)
nRBC: 0.2 % (ref 0.0–0.2)

## 2020-11-07 LAB — MAGNESIUM: Magnesium: 2 mg/dL (ref 1.7–2.4)

## 2020-11-07 LAB — BRAIN NATRIURETIC PEPTIDE: B Natriuretic Peptide: 56.8 pg/mL (ref 0.0–100.0)

## 2020-11-07 MED ORDER — ENSURE ENLIVE PO LIQD
237.0000 mL | Freq: Three times a day (TID) | ORAL | Status: DC
  Administered 2020-11-08 (×2): 237 mL via ORAL

## 2020-11-07 MED ORDER — POTASSIUM CHLORIDE CRYS ER 20 MEQ PO TBCR
40.0000 meq | EXTENDED_RELEASE_TABLET | Freq: Two times a day (BID) | ORAL | Status: AC
  Administered 2020-11-07 – 2020-11-08 (×2): 40 meq via ORAL
  Filled 2020-11-07 (×2): qty 2

## 2020-11-07 MED ORDER — FUROSEMIDE 10 MG/ML IJ SOLN
40.0000 mg | Freq: Once | INTRAMUSCULAR | Status: AC
  Administered 2020-11-07: 40 mg via INTRAVENOUS
  Filled 2020-11-07: qty 4

## 2020-11-07 MED ORDER — BENZONATATE 100 MG PO CAPS
200.0000 mg | ORAL_CAPSULE | Freq: Three times a day (TID) | ORAL | Status: DC | PRN
  Administered 2020-11-08 – 2020-11-11 (×5): 200 mg via ORAL
  Filled 2020-11-07 (×5): qty 2

## 2020-11-07 NOTE — Progress Notes (Addendum)
Nutrition Follow-up  DOCUMENTATION CODES:   Not applicable  INTERVENTION:   -MVI with minerals daily -D/c Boost Breeze po TID, each supplement provides 250 kcal and 9 grams of protein -Magic cup TID with meals, each supplement provides 290 kcal and 9 grams of protein -Hormel Shake TID with meals, each supplement provides 520 kcals and 22 grams protein -If poor oral intake persists, may need to consider temporary means of nutrition support (ex cortrak)  NUTRITION DIAGNOSIS:   Increased nutrient needs related to acute illness (COVID-19 pneumonia) as evidenced by estimated needs.  Ongoing  GOAL:   Patient will meet greater than or equal to 90% of their needs  Unmet  MONITOR:   PO intake,Supplement acceptance,Labs,Weight trends,I & O's  REASON FOR ASSESSMENT:   Malnutrition Screening Tool    ASSESSMENT:   55 y.o. male with PMHx of  CAD s/p PCI, tobacco use, HLD, GERD-who presented with shortness of breath-found to have acute hypoxic respiratory failure requiring 100% O2 via NRB due to COVID-19 pneumonia.  Reviewed I/O's: -37 ml x 24 hours and +976 ml since 10/24/20  UOP: 400 ml x 24 hours  Pt unavailable at time of attempted contact.   Pt continues on non-rebreather masking; requiring 15 L of oxygent to avoid desaturations. Per chart review, pt with high anxiety.   Pt with erratic oral intake. Noted meal completion 0-75%. He is refusing Boost Breeze supplements. Noted pt with limited acceptance of other supplements (Ensure and Prosource Plus).   Reviewed wt hx;pt has experienced a 12.2% wt loss over the past month, which is significant for time frame. Pt is at high risk for malnutrition, however, unable to identify at this time.   Medications reviewed and include melatonin and potassium chloride  Labs reviewed.   Diet Order:   Diet Order            Diet Heart Room service appropriate? No; Fluid consistency: Thin; Fluid restriction: 2000 mL Fluid  Diet effective  now                 EDUCATION NEEDS:   No education needs have been identified at this time  Skin:  Skin Assessment: Reviewed RN Assessment  Last BM:  11/07/20  Height:   Ht Readings from Last 1 Encounters:  09/27/20 5\' 11"  (1.803 m)    Weight:   Wt Readings from Last 1 Encounters:  11/07/20 91.6 kg   BMI:  Body mass index is 28.17 kg/m.  Estimated Nutritional Needs:   Kcal:  2300-2500  Protein:  110-120g  Fluid:  2L/day    Loistine Chance, RD, LDN, [place] Registered Dietitian II Certified Diabetes Care and Education Specialist Please refer to [facility] for RD and/or RD on-call/weekend/after hours pager

## 2020-11-07 NOTE — Progress Notes (Addendum)
[facility] PROGRESS NOTE    Progress Note  Matthew [name]  SNK:[id number] DOB: [birth year] DOA: 09/26/2020 PCP: Patient, No Pcp Per     Brief Narrative:   Matthew [name] is an 55 y.o. male past medical history significant of CAD status post PCI tobacco abuse hyperlipidemia comes into the hospital with acute respiratory failure due to COVID-19 requiring 100% nonrebreather  COVID-19 vaccine status: Unvaccinated  Significant Events: 12/9>> started having GI symptoms including diarrhea 12/13>> to urgent care-for diarrhea-discharged after supportive care/IV fluids 12/16>> Admit to [facility] for hypoxia due to COVID-19 pneumonia-requiring 100% O2 via NRB 12/28>> hemoptysis-PCCM consulted-supportive care recommended. 12/31>> worsening hypoxemia-started on heated high flow-unable to lie flat for a CTA chest  Significant studies: 12/17>> CTA chest: No PE, diffuse groundglass opacities 12/18>> Echo: EF 60-65% 12/26>> chest x-ray: Diffuse bilateral heterogeneous/interstitial airspace opacities 12/31>> chest x-ray: Stable bilateral lung opacities 12/31>> lower extremity Doppler: No DVT 1/5>> chest x-ray: Mild improvement in multifocal opacities 1/10>> chest x-ray: Diffuse bilateral airspace disease 1/15>> chest x-ray: Persistent diffuse bilateral hazy opacities-similar/slightly improved from prior study   COVID-19 medications: Steroids: 12/16>>1/15 Remdesivir: 12/16>> 12/20 Baricitinib: 12/17>>12/30  Antibiotics: None  Microbiology data: None  Procedures: None  Consults: PCCM  Assessment/Plan:   Acute respiratory failure with hypoxia due to COVID-19 viral pneumonia now in ARDS: Unfortunately now with severe lung injury due to COVID-19 leading to ARDS. He has completed his treatment of IV remdesivir baricitinib's and steroids. He has completed his isolation.Marland Kitchen He is now requiring 15 L of oxygen to try to keep saturations greater 94%. Encourage patient to sit  up in the chair continue to work physical therapy, encourage incentive spirometry and flutter valve. Patient has been extremely noncompliant and reluctant to move around in bed and cooperating with staff and with treatment. He has undiagnosed obstructive sleep apnea, at night his saturations decreased significantly. We need to stress to the patient that the benzodiazepines are not treating his hypoxia as the oxygen he is fixated on that the benzos are used to treat his hypoxia.  Oxygen requirements: SpO2: 97% High flow nasal cannula: 15 L  Elevated D-dimer: CTA was negative x2 along with lower extremity Doppler. Continue Lovenox for DVT prophylaxis.  Acute on chronic diastolic heart failure: Appears to be compensated. He has been adequately diuresed.    Hemoptysis: 1 episode on 10/08/2020 now resolved.  Hypotension: Now on low-dose midodrine currently asymptomatic.  Transaminitis: Likely due to COVID-19 if continue to improve.  Bipolar dzd: Cont lexapro.  CAD status post PCI: No anginal symptoms on antiplatelet therapy at home.  GERD: Continue PPI.  DVT prophylaxis: lovenox Family Communication:wife Status is: Inpatient  Remains inpatient appropriate because:Hemodynamically unstable   Dispo: The patient is from: Home              Anticipated d/c is to: Home              Anticipated d/c date is: > 3 days              Patient currently is not medically stable to d/c.   Difficult to place patient No  Code Status:     Code Status Orders  (From admission, onward)         Start     Ordered   09/26/20 2219  Full code  Continuous        09/26/20 2222        Code Status History    Date Active Date Inactive Code Status  Order ID Comments User Context   11/17/2019 1618 11/20/2019 1735 Full Code [id number]  Laurin Coder, MD Inpatient   Advance Care Planning Activity        IV Access:    Peripheral IV   Procedures and diagnostic studies:   No results  found.   Medical Consultants:    None.  Anti-Infectives:   none  Subjective:    Matthew [name] patient relates his breathing is about the same.  Objective:    Vitals:   11/06/20 2014 11/06/20 2309 11/07/20 0051 11/07/20 0755  BP:  99/66    Pulse: 70 66    Resp: 20 20    Temp:  98 F (36.7 C)    TempSrc:      SpO2: 99% 100%  97%  Weight:   91.6 kg   Height:       SpO2: 97 % O2 Flow Rate (L/min): 15 L/min FiO2 (%): 50 %   Intake/Output Summary (Last 24 hours) at 11/07/2020 0800 Last data filed at 11/06/2020 2126 Gross per 24 hour  Intake 3 ml  Output 400 ml  Net -397 ml   Filed Weights   11/05/20 0354 11/06/20 0334 11/07/20 0051  Weight: 91.6 kg 92.1 kg 91.6 kg    Exam: General exam: In no acute distress. Respiratory system: Good air movement and diffuse crackles bilaterally Cardiovascular system: S1 & S2 heard, RRR. No JVD. Gastrointestinal system: Abdomen is nondistended, soft and nontender.  Extremities: No pedal edema. Skin: No rashes, lesions or ulcers Psychiatry: Judgement and insight appear normal. Mood & affect appropriate.   Data Reviewed:    Labs: Basic Metabolic Panel: Recent Labs  Lab 11/03/20 0358 11/04/20 0309 11/05/20 0423 11/06/20 0302 11/07/20 0155  NA 137 136 138 137 137  K 4.2 3.3* 3.7 3.9 3.5  CL 99 98 100 99 99  CO2 28 26 27 27 27   GLUCOSE 105* 98 97 99 108*  BUN 8 8 10 10 10   CREATININE 0.99 0.91 1.01 1.06 1.00  CALCIUM 8.3* 8.4* 8.6* 8.5* 8.3*  MG 2.1 2.0 2.2 2.1 2.0   GFR Estimated Creatinine Clearance: 95.4 mL/min (by C-G formula based on SCr of 1 mg/dL). Liver Function Tests: Recent Labs  Lab 11/03/20 0358 11/04/20 0309 11/05/20 0423 11/06/20 0302 11/07/20 0155  AST 22 22 26 27 23   ALT 27 28 32 31 26  ALKPHOS 98 95 96 95 95  BILITOT 1.0 0.8 0.7 0.6 1.0  PROT 7.4 7.4 7.6 7.6 7.7  ALBUMIN 2.3* 2.3* 2.3* 2.4* 2.3*   No results for input(s): LIPASE, AMYLASE in the last 168 hours. No results for  input(s): AMMONIA in the last 168 hours. Coagulation profile No results for input(s): INR, PROTIME in the last 168 hours. COVID-19 Labs  Recent Labs    11/05/20 0423  DDIMER 1.53*  CRP 8.4*    Lab Results  Component Value Date   SARSCOV2NAA POSITIVE (A) 09/26/2020   SARSCOV2NAA NEGATIVE 02/06/2020   [place] NEGATIVE 11/17/2019    CBC: Recent Labs  Lab 11/03/20 0358 11/04/20 0309 11/05/20 0423 11/06/20 0302 11/07/20 0155  WBC 11.4* 11.7* 11.7* 12.4* 13.0*  NEUTROABS 8.4* 8.4* 8.2* 9.1* 10.0*  HGB 11.1* 10.6* 10.9* 10.3* 9.8*  HCT 33.3* 33.1* 33.8* 32.5* 31.2*  MCV 94.1 95.1 96.0 96.4 96.6  PLT 211 213 214 223 207   Cardiac Enzymes: No results for input(s): CKTOTAL, CKMB, CKMBINDEX, TROPONINI in the last 168 hours. BNP (last 3 results) No results for input(s):  PROBNP in the last 8760 hours. CBG: No results for input(s): GLUCAP in the last 168 hours. D-Dimer: Recent Labs    11/05/20 0423  DDIMER 1.53*   Hgb A1c: No results for input(s): HGBA1C in the last 72 hours. Lipid Profile: No results for input(s): CHOL, HDL, LDLCALC, TRIG, CHOLHDL, LDLDIRECT in the last 72 hours. Thyroid function studies: No results for input(s): TSH, T4TOTAL, T3FREE, THYROIDAB in the last 72 hours.  Invalid input(s): FREET3 Anemia work up: No results for input(s): VITAMINB12, FOLATE, FERRITIN, TIBC, IRON, RETICCTPCT in the last 72 hours. Sepsis Labs: Recent Labs  Lab 11/04/20 0309 11/05/20 0423 11/06/20 0302 11/07/20 0155  WBC 11.7* 11.7* 12.4* 13.0*   Microbiology No results found for this or any previous visit (from the past 240 hour(s)).   Medications:   . benzonatate  200 mg Oral TID  . enoxaparin (LOVENOX) injection  60 mg Subcutaneous Q12H  . escitalopram  20 mg Oral Daily  . feeding supplement  1 Container Oral BID BM  . melatonin  3 mg Oral QHS  . midodrine  10 mg Oral BID WC  . multivitamins with iron  1 tablet Oral Daily  . pantoprazole  40 mg Oral Daily   . sodium chloride flush  3 mL Intravenous Q12H   Continuous Infusions:    LOS: 42 days   Charlynne Cousins  [facility]  11/07/2020, 8:00 AM

## 2020-11-07 NOTE — Plan of Care (Signed)
  Problem: Clinical Measurements: Goal: Respiratory complications will improve Outcome: Progressing   Problem: Coping: Goal: Level of anxiety will decrease Outcome: Progressing   

## 2020-11-07 NOTE — Progress Notes (Signed)
Physical Therapy Treatment Patient Details Name: Matthew [name] MRN: [id number] DOB: [birth year] Today's Date: 11/07/2020    History of Present Illness Pt is a 55 y.o. male admitted 09/26/20 with SOB and GI distress (had declined COVID test when seen in urgent care). Pt now testing (+) COVID-19. Workup for acute hypoxic respiratory failure due to acute COVID-19 viral PNA. Course complicated by hemoptysis, worsening hypoxemis requiring HHFNC; pt unable to lay flat for chest CTA. Negative DVT on 12/31. Repeat CXR 1/10 with diffuse bilateral airspace disease. PMH includes CAD, CHF, tobacco use (quit 11/2019); pt unvaccinated.    PT Comments    Pt progress continues to be slow secondary to anxiety. Max encouragement was required to progress from sit>stand. Pt required several breaks to slow down breathing prior to standing. When questioned what pt was so scared of he stated "I'm scared I'm going to die". Did well when distracted with talk of fishing and seems to be motivated by a desire to return to work at his [employer] job. Pt on 15L via NRB. SpO2 dropped to 82% after ambulation and took several min to recover to 88% with cues for breathing and distracting with talk of hobbies. Having chair to follow with ambulation assists with pt confidence but not necessary. RN notified of O2 sats and present at end of session.    Follow Up Recommendations  SNF;LTACH     Equipment Recommendations  3in1 (PT);Wheelchair (measurements PT)    Recommendations for Other Services       Precautions / Restrictions Precautions Precautions: Fall;Other (comment) Precaution Comments: anxiety; watch SpO2 on NRB    Mobility  Bed Mobility Overal bed mobility: Needs Assistance             General bed mobility comments: OOB in chair  Transfers Overall transfer level: Needs assistance Equipment used: Rolling walker (2 wheeled) Transfers: Sit to/from Stand Sit to Stand: Min guard         General  transfer comment: min guard for safety. Cues for hand placement.  Ambulation/Gait Ambulation/Gait assistance: Min guard (chair to follow for pt confidence) Gait Distance (Feet): 12 Feet Assistive device: Rolling walker (2 wheeled) Gait Pattern/deviations: Step-to pattern;Shuffle Gait velocity: Decreased   General Gait Details: Pt ambulated short distance in room, however required max encouragement. Once up min guard for safety and assist for line management.   Stairs             Wheelchair Mobility    Modified Rankin (Stroke Patients Only)       Balance Overall balance assessment: Needs assistance Sitting-balance support: Feet supported Sitting balance-Leahy Scale: Good     Standing balance support: During functional activity;Single extremity supported Standing balance-Leahy Scale: Poor Standing balance comment: Reliant on UE support                            Cognition Arousal/Alertness: Awake/alert Behavior During Therapy: WFL for tasks assessed/performed Overall Cognitive Status: Within Functional Limits for tasks assessed                                 General Comments: New anxiety since COVID. When anxiety is high pt takes short gasping breaths and struggles to slow breathing, despite cues and relaxation techniques.      Exercises Other Exercises Other Exercises: attempted breathing technique with pt, however pt unwilling.    General Comments General comments (  skin integrity, edema, etc.): Pt anxious and feeling that other's do not understand how hard it is to get moving. Wife present and at times shaming pt in an attempt to push him to progress.      Pertinent Vitals/Pain Pain Assessment: No/denies pain    Home Living                      Prior Function            PT Goals (current goals can now be found in the care plan section) Acute Rehab PT Goals Patient Stated Goal: long term goal: get back to fishing;  short term goal: get home to wife PT Goal Formulation: With patient Time For Goal Achievement: 11/11/20 Potential to Achieve Goals: Fair    Frequency    Min 3X/week      PT Plan Current plan remains appropriate    Co-evaluation              AM-PAC PT "6 Clicks" Mobility   Outcome Measure  Help needed turning from your back to your side while in a flat bed without using bedrails?: None Help needed moving from lying on your back to sitting on the side of a flat bed without using bedrails?: None Help needed moving to and from a bed to a chair (including a wheelchair)?: A Little Help needed standing up from a chair using your arms (e.g., wheelchair or bedside chair)?: A Little Help needed to walk in hospital room?: A Little Help needed climbing 3-5 steps with a railing? : Total 6 Click Score: 18    End of Session Equipment Utilized During Treatment: Oxygen;Gait belt Activity Tolerance: Other (comment) (Limited by anxiety.) Patient left: in chair;with call bell/phone within reach;with nursing/sitter in room;with family/visitor present Nurse Communication: Mobility status PT Visit Diagnosis: Other abnormalities of gait and mobility (R26.89)     Time: 5053-9767 PT Time Calculation (min) (ACUTE ONLY): 42 min  Charges:  $Gait Training: 8-22 mins $Therapeutic Activity: 23-37 mins                     Benjiman Core, [place] Pager [phone] Acute Rehab   Allena Katz 11/07/2020, 12:45 PM

## 2020-11-08 DIAGNOSIS — J80 Acute respiratory distress syndrome: Secondary | ICD-10-CM

## 2020-11-08 LAB — CBC WITH DIFFERENTIAL/PLATELET
Abs Immature Granulocytes: 0.45 10*3/uL — ABNORMAL HIGH (ref 0.00–0.07)
Basophils Absolute: 0.1 10*3/uL (ref 0.0–0.1)
Basophils Relative: 0 %
Eosinophils Absolute: 0.2 10*3/uL (ref 0.0–0.5)
Eosinophils Relative: 1 %
HCT: 32.6 % — ABNORMAL LOW (ref 39.0–52.0)
Hemoglobin: 10.2 g/dL — ABNORMAL LOW (ref 13.0–17.0)
Immature Granulocytes: 4 %
Lymphocytes Relative: 12 %
Lymphs Abs: 1.4 10*3/uL (ref 0.7–4.0)
MCH: 30.2 pg (ref 26.0–34.0)
MCHC: 31.3 g/dL (ref 30.0–36.0)
MCV: 96.4 fL (ref 80.0–100.0)
Monocytes Absolute: 0.9 10*3/uL (ref 0.1–1.0)
Monocytes Relative: 8 %
Neutro Abs: 8.8 10*3/uL — ABNORMAL HIGH (ref 1.7–7.7)
Neutrophils Relative %: 75 %
Platelets: 211 10*3/uL (ref 150–400)
RBC: 3.38 MIL/uL — ABNORMAL LOW (ref 4.22–5.81)
RDW: 14.8 % (ref 11.5–15.5)
WBC: 11.8 10*3/uL — ABNORMAL HIGH (ref 4.0–10.5)
nRBC: 0.2 % (ref 0.0–0.2)

## 2020-11-08 LAB — COMPREHENSIVE METABOLIC PANEL
ALT: 26 U/L (ref 0–44)
AST: 20 U/L (ref 15–41)
Albumin: 2.3 g/dL — ABNORMAL LOW (ref 3.5–5.0)
Alkaline Phosphatase: 104 U/L (ref 38–126)
Anion gap: 10 (ref 5–15)
BUN: 11 mg/dL (ref 6–20)
CO2: 29 mmol/L (ref 22–32)
Calcium: 8.6 mg/dL — ABNORMAL LOW (ref 8.9–10.3)
Chloride: 99 mmol/L (ref 98–111)
Creatinine, Ser: 1 mg/dL (ref 0.61–1.24)
GFR, Estimated: >60 mL/min (ref >60)
Glucose, Bld: 117 mg/dL — ABNORMAL HIGH (ref 70–99)
Potassium: 4.4 mmol/L (ref 3.5–5.1)
Sodium: 138 mmol/L (ref 135–145)
Total Bilirubin: 0.6 mg/dL (ref 0.3–1.2)
Total Protein: 7.7 g/dL (ref 6.5–8.1)

## 2020-11-08 LAB — BRAIN NATRIURETIC PEPTIDE: B Natriuretic Peptide: 32.1 pg/mL (ref 0.0–100.0)

## 2020-11-08 LAB — MAGNESIUM: Magnesium: 2 mg/dL (ref 1.7–2.4)

## 2020-11-08 NOTE — Progress Notes (Signed)
Patient ambulated to chair.Continues to desat when ambulated into 52s. Pt placed on nonrebreather until oxygen returned to 88-90%. Then placed back on [place] at 7 L.Will continue to monitor. Marland Kitchen

## 2020-11-08 NOTE — Plan of Care (Signed)
  Problem: Safety: Goal: Ability to remain free from injury will improve Outcome: Not Progressing  Patient requested to move back to bed from the chair. He stated that he wanted to slide into bed from the chair. RN requested physical therapist to come in to assist. PT educated patient on how to move from chair. Patient did not follow instructions and impulsively rolled himself from the chair onto the bed. Pt was uninjured in move. Pt was informed that it had been unsafe.

## 2020-11-08 NOTE — Progress Notes (Signed)
Physical Therapy Treatment Patient Details Name: Matthew [name] MRN: [id number] DOB: [birth year] Today's Date: 11/08/2020    History of Present Illness Pt is a 55 y.o. male admitted 09/26/20 with SOB and GI distress (had declined COVID test when seen in urgent care). Pt now testing (+) COVID-19. Workup for acute hypoxic respiratory failure due to acute COVID-19 viral PNA. Course complicated by hemoptysis, worsening hypoxemis requiring HHFNC; pt unable to lay flat for chest CTA. Negative DVT on 12/31. Repeat CXR 1/10 with diffuse bilateral airspace disease. PMH includes CAD, CHF, tobacco use (quit 11/2019); pt unvaccinated.    PT Comments    Pt was assisted back to bed per nsg request and pt asked to scoot across the chair to the bed.  Took pt through the safe protocol and he chose to roll off the chair onto the bed.  Talked with hima bout the unsafe way he ended the session, and he declined to accept this as true.   Nursing reinforced this as well, and pt was repositioned on the bed.  Poor decisions at times and is unsure as whether he has cognitive changes or is having post covid enceophalopathy.  Follow Up Recommendations  SNF;LTACH     Equipment Recommendations  3in1 (PT);Wheelchair (measurements PT)    Recommendations for Other Services       Precautions / Restrictions Precautions Precautions: Fall Precaution Comments: anxiety, monitor sats Restrictions Weight Bearing Restrictions: No    Mobility  Bed Mobility Overal bed mobility: Needs Assistance             General bed mobility comments: OOB in chair  Transfers Overall transfer level: Needs assistance Equipment used: 1 person hand held assist;Ambulation equipment used Transfers: Lateral/Scoot Transfers          Lateral/Scoot Transfers: Min assist General transfer comment: none performed this session  Ambulation/Gait                 Stairs             Wheelchair Mobility    Modified  Rankin (Stroke Patients Only)       Balance Overall balance assessment: Needs assistance Sitting-balance support: Feet supported Sitting balance-Leahy Scale: Good   Postural control: Posterior lean Standing balance support: During functional activity;Single extremity supported                                Cognition Arousal/Alertness: Awake/alert Behavior During Therapy: WFL for tasks assessed/performed Overall Cognitive Status: Within Functional Limits for tasks assessed                                 General Comments: New anxiety since COVID. When anxiety is high pt takes short gasping breaths and struggles to slow breathing, despite cues and relaxation techniques.      Exercises General Exercises - Upper Extremity Shoulder Flexion: AROM;5 reps General Exercises - Lower Extremity Ankle Circles/Pumps: Both;10 reps Quad Sets: Both;10 reps Heel Slides: Both;10 reps    General Comments General comments (skin integrity, edema, etc.): pt is assisted to shift in chair but declines to stand or go back to bed.      Pertinent Vitals/Pain Pain Assessment: No/denies pain    Home Living                      Prior Function  PT Goals (current goals can now be found in the care plan section) Acute Rehab PT Goals Patient Stated Goal: get past breathing issues    Frequency    Min 3X/week      PT Plan Current plan remains appropriate    Co-evaluation              AM-PAC PT "6 Clicks" Mobility   Outcome Measure  Help needed turning from your back to your side while in a flat bed without using bedrails?: None Help needed moving from lying on your back to sitting on the side of a flat bed without using bedrails?: None Help needed moving to and from a bed to a chair (including a wheelchair)?: None Help needed standing up from a chair using your arms (e.g., wheelchair or bedside chair)?: A Little Help needed to walk in  hospital room?: A Little Help needed climbing 3-5 steps with a railing? : A Little 6 Click Score: 21    End of Session Equipment Utilized During Treatment: Oxygen;Gait belt Activity Tolerance: Other (comment) Patient left: in chair;with call bell/phone within reach Nurse Communication: Mobility status PT Visit Diagnosis: Other abnormalities of gait and mobility (R26.89)     Time: 0034-9179 PT Time Calculation (min) (ACUTE ONLY): 13 min  Charges:  $Therapeutic Exercise: 8-22 mins $Therapeutic Activity: 8-22 mins                    Ramond Dial 11/08/2020, 8:40 PM  Mee Hives, PT [facility] Acute Rehab Dept. Number: [place] and [place]

## 2020-11-08 NOTE — Progress Notes (Signed)
Physical Therapy Treatment Patient Details Name: Matthew [name] MRN: [id number] DOB: 03-22-1966 Today's Date: 11/08/2020    History of Present Illness Pt is a 55 y.o. male admitted 09/26/20 with SOB and GI distress (had declined COVID test when seen in urgent care). Pt now testing (+) COVID-19. Workup for acute hypoxic respiratory failure due to acute COVID-19 viral PNA. Course complicated by hemoptysis, worsening hypoxemis requiring HHFNC; pt unable to lay flat for chest CTA. Negative DVT on 12/31. Repeat CXR 1/10 with diffuse bilateral airspace disease. PMH includes CAD, CHF, tobacco use (quit 11/2019); pt unvaccinated.    PT Comments    Pt was encouraged to do standing but declined, maintaining sats at 85% or greater about 60% of the time and desatting the reset of the time. Nursing in to assist with pt symptoms, and provided meds.  Pt is mainly complaining of anxiety, but is willing to work with PT.  Follow acutely for work on strength, standing balance and endurance including training O2 sat pulses.    Follow Up Recommendations  SNF;LTACH     Equipment Recommendations  3in1 (PT);Wheelchair (measurements PT)    Recommendations for Other Services       Precautions / Restrictions Precautions Precautions: Fall Precaution Comments: anxiety, monitor sats Restrictions Weight Bearing Restrictions: No    Mobility  Bed Mobility Overal bed mobility: Needs Assistance             General bed mobility comments: OOB in chair  Transfers Overall transfer level: Needs assistance               General transfer comment: none performed this session  Ambulation/Gait                 Stairs             Wheelchair Mobility    Modified Rankin (Stroke Patients Only)       Balance Overall balance assessment: Needs assistance Sitting-balance support: Feet supported Sitting balance-Leahy Scale: Good   Postural control: Posterior lean Standing balance  support: During functional activity;Single extremity supported                                Cognition Arousal/Alertness: Awake/alert Behavior During Therapy: WFL for tasks assessed/performed Overall Cognitive Status: Within Functional Limits for tasks assessed                                 General Comments: New anxiety since COVID. When anxiety is high pt takes short gasping breaths and struggles to slow breathing, despite cues and relaxation techniques.      Exercises General Exercises - Upper Extremity Shoulder Flexion: AROM;5 reps General Exercises - Lower Extremity Ankle Circles/Pumps: Both;10 reps Quad Sets: Both;10 reps Heel Slides: Both;10 reps    General Comments General comments (skin integrity, edema, etc.): pt is assisted to shift in chair but declines to stand or go back to bed.      Pertinent Vitals/Pain Pain Assessment: No/denies pain    Home Living                      Prior Function            PT Goals (current goals can now be found in the care plan section) Acute Rehab PT Goals Patient Stated Goal: get past breathing issues  Frequency    Min 3X/week      PT Plan Current plan remains appropriate    Co-evaluation              AM-PAC PT "6 Clicks" Mobility   Outcome Measure  Help needed turning from your back to your side while in a flat bed without using bedrails?: None Help needed moving from lying on your back to sitting on the side of a flat bed without using bedrails?: None Help needed moving to and from a bed to a chair (including a wheelchair)?: None Help needed standing up from a chair using your arms (e.g., wheelchair or bedside chair)?: A Little Help needed to walk in hospital room?: A Little Help needed climbing 3-5 steps with a railing? : A Little 6 Click Score: 21    End of Session Equipment Utilized During Treatment: Oxygen;Gait belt Activity Tolerance: Other (comment) Patient  left: in chair;with call bell/phone within reach Nurse Communication: Mobility status PT Visit Diagnosis: Other abnormalities of gait and mobility (R26.89)     Time: 7017-7939 PT Time Calculation (min) (ACUTE ONLY): 24 min  Charges:  $Therapeutic Exercise: 8-22 mins $Therapeutic Activity: 8-22 mins                 Ramond Dial 11/08/2020, 8:32 PM Mee Hives, PT [facility] Acute Rehab Dept. Number: [place] and [place]

## 2020-11-08 NOTE — Progress Notes (Signed)
[facility] PROGRESS NOTE    Progress Note  Matthew [name]  FOY:[id number] DOB: 1966/03/05 DOA: 09/26/2020 PCP: Patient, No Pcp Per     Brief Narrative:   Matthew [name] is an 55 y.o. male past medical history significant of CAD status post PCI tobacco abuse hyperlipidemia comes into the hospital with acute respiratory failure due to COVID-19 requiring 100% nonrebreather  COVID-19 vaccine status: Unvaccinated  Significant Events: 12/9>> started having GI symptoms including diarrhea 12/13>> to urgent care-for diarrhea-discharged after supportive care/IV fluids 12/16>> Admit to [facility] for hypoxia due to COVID-19 pneumonia-requiring 100% O2 via NRB 12/28>> hemoptysis-PCCM consulted-supportive care recommended. 12/31>> worsening hypoxemia-started on heated high flow-unable to lie flat for a CTA chest  Significant studies: 12/17>> CTA chest: No PE, diffuse groundglass opacities 12/18>> Echo: EF 60-65% 12/26>> chest x-ray: Diffuse bilateral heterogeneous/interstitial airspace opacities 12/31>> chest x-ray: Stable bilateral lung opacities 12/31>> lower extremity Doppler: No DVT 1/5>> chest x-ray: Mild improvement in multifocal opacities 1/10>> chest x-ray: Diffuse bilateral airspace disease 1/15>> chest x-ray: Persistent diffuse bilateral hazy opacities-similar/slightly improved from prior study   COVID-19 medications: Steroids: 12/16>>1/15 Remdesivir: 12/16>> 12/20 Baricitinib: 12/17>>12/30  Antibiotics: None  Microbiology data: None  Procedures: None  Consults: PCCM  Assessment/Plan:   Acute respiratory failure with hypoxia due to COVID-19 viral pneumonia now in ARDS: Unfortunately now with severe lung injury due to COVID-19 leading to ARDS. He has completed his treatment of IV remdesivir baricitinib's and steroids. He has completed his isolation.. When I came in this morning he was on a nonrebreather satting 100%, we were able to put him down on  high flow nasal cannula 9 L satting greater than 92%. Out of bed to chair, recommend incentive spirometry and flutter valve. We need to wean him off the oxygen. He has undiagnosed obstructive sleep apnea, at night his saturations decreased significantly. Hopefully we will get him home over the weekend.  Elevated D-dimer: CTA was negative x2 along with lower extremity Doppler. Change Lovenox for DVT prophylaxis.  Acute on chronic diastolic heart failure: Appears to be compensated. He has been adequately diuresed.    Hemoptysis: 1 episode on 10/08/2020 now resolved.  Hypotension: Now on low-dose midodrine currently asymptomatic.  Transaminitis: Likely due to COVID-19 if continue to improve.  Bipolar dzd: Cont lexapro.  CAD status post PCI: No anginal symptoms on antiplatelet therapy at home.  GERD: Continue PPI.  DVT prophylaxis: lovenox Family Communication:wife Status is: Inpatient  Remains inpatient appropriate because:Hemodynamically unstable   Dispo: The patient is from: Home              Anticipated d/c is to: Home              Anticipated d/c date is: > 3 days              Patient currently is not medically stable to d/c.   Difficult to place patient No  Code Status:     Code Status Orders  (From admission, onward)         Start     Ordered   09/26/20 2219  Full code  Continuous        09/26/20 2222        Code Status History    Date Active Date Inactive Code Status Order ID Comments User Context   11/17/2019 1618 11/20/2019 1735 Full Code [id number]  Laurin Coder, MD Inpatient   Advance Care Planning Activity        IV Access:  Peripheral IV   Procedures and diagnostic studies:   No results found.   Medical Consultants:    None.  Anti-Infectives:   none  Subjective:    Matthew [name] is his breathing is better.  Objective:    Vitals:   11/07/20 2014 11/08/20 0022 11/08/20 0500 11/08/20 0704  BP:  100/73  97/68 97/69  Pulse: 77 79 76 82  Resp: 18 (!) 36 (!) 32 (!) 28  Temp:  97.8 F (36.6 C) 97.8 F (36.6 C) 97.9 F (36.6 C)  TempSrc:  Oral Oral Oral  SpO2: 97% 100% 100% 100%  Weight:   89.8 kg   Height:       SpO2: 100 % O2 Flow Rate (L/min): 15 L/min FiO2 (%): 50 %   Intake/Output Summary (Last 24 hours) at 11/08/2020 0814 Last data filed at 11/07/2020 2300 Gross per 24 hour  Intake 360 ml  Output 800 ml  Net -440 ml   Filed Weights   11/06/20 0334 11/07/20 0051 11/08/20 0500  Weight: 92.1 kg 91.6 kg 89.8 kg    Exam: General exam: In no acute distress. Respiratory system: Good air movement and diffuse crackles bilaterally Cardiovascular system: S1 & S2 heard, RRR. No JVD. Gastrointestinal system: Abdomen is nondistended, soft and nontender.  Extremities: No pedal edema. Skin: No rashes, lesions or ulcers Psychiatry: Judgement and insight appear normal. Mood & affect appropriate.   Data Reviewed:    Labs: Basic Metabolic Panel: Recent Labs  Lab 11/04/20 0309 11/05/20 0423 11/06/20 0302 11/07/20 0155 11/08/20 0545  NA 136 138 137 137 138  K 3.3* 3.7 3.9 3.5 4.4  CL 98 100 99 99 99  CO2 26 27 27 27 29   GLUCOSE 98 97 99 108* 117*  BUN 8 10 10 10 11   CREATININE 0.91 1.01 1.06 1.00 1.00  CALCIUM 8.4* 8.6* 8.5* 8.3* 8.6*  MG 2.0 2.2 2.1 2.0 2.0   GFR Estimated Creatinine Clearance: 87.9 mL/min (by C-G formula based on SCr of 1 mg/dL). Liver Function Tests: Recent Labs  Lab 11/04/20 0309 11/05/20 0423 11/06/20 0302 11/07/20 0155 11/08/20 0545  AST 22 26 27 23 20   ALT 28 32 31 26 26   ALKPHOS 95 96 95 95 104  BILITOT 0.8 0.7 0.6 1.0 0.6  PROT 7.4 7.6 7.6 7.7 7.7  ALBUMIN 2.3* 2.3* 2.4* 2.3* 2.3*   No results for input(s): LIPASE, AMYLASE in the last 168 hours. No results for input(s): AMMONIA in the last 168 hours. Coagulation profile No results for input(s): INR, PROTIME in the last 168 hours. COVID-19 Labs  No results for input(s): DDIMER,  FERRITIN, LDH, CRP in the last 72 hours.  Lab Results  Component Value Date   SARSCOV2NAA POSITIVE (A) 09/26/2020   [place] NEGATIVE 02/06/2020   [place] NEGATIVE 11/17/2019    CBC: Recent Labs  Lab 11/04/20 0309 11/05/20 0423 11/06/20 0302 11/07/20 0155 11/08/20 0545  WBC 11.7* 11.7* 12.4* 13.0* 11.8*  NEUTROABS 8.4* 8.2* 9.1* 10.0* 8.8*  HGB 10.6* 10.9* 10.3* 9.8* 10.2*  HCT 33.1* 33.8* 32.5* 31.2* 32.6*  MCV 95.1 96.0 96.4 96.6 96.4  PLT 213 214 223 207 211   Cardiac Enzymes: No results for input(s): CKTOTAL, CKMB, CKMBINDEX, TROPONINI in the last 168 hours. BNP (last 3 results) No results for input(s): PROBNP in the last 8760 hours. CBG: No results for input(s): GLUCAP in the last 168 hours. D-Dimer: No results for input(s): DDIMER in the last 72 hours. Hgb A1c: No results for input(s):  HGBA1C in the last 72 hours. Lipid Profile: No results for input(s): CHOL, HDL, LDLCALC, TRIG, CHOLHDL, LDLDIRECT in the last 72 hours. Thyroid function studies: No results for input(s): TSH, T4TOTAL, T3FREE, THYROIDAB in the last 72 hours.  Invalid input(s): FREET3 Anemia work up: No results for input(s): VITAMINB12, FOLATE, FERRITIN, TIBC, IRON, RETICCTPCT in the last 72 hours. Sepsis Labs: Recent Labs  Lab 11/05/20 0423 11/06/20 0302 11/07/20 0155 11/08/20 0545  WBC 11.7* 12.4* 13.0* 11.8*   Microbiology No results found for this or any previous visit (from the past 240 hour(s)).   Medications:   . enoxaparin (LOVENOX) injection  60 mg Subcutaneous Q12H  . escitalopram  20 mg Oral Daily  . feeding supplement  237 mL Oral TID BM  . melatonin  3 mg Oral QHS  . midodrine  10 mg Oral BID WC  . multivitamins with iron  1 tablet Oral Daily  . pantoprazole  40 mg Oral Daily  . sodium chloride flush  3 mL Intravenous Q12H   Continuous Infusions:    LOS: 43 days   Charlynne Cousins  [facility]  11/08/2020, 8:14 AM

## 2020-11-09 LAB — COMPREHENSIVE METABOLIC PANEL
ALT: 23 U/L (ref 0–44)
AST: 20 U/L (ref 15–41)
Albumin: 2.3 g/dL — ABNORMAL LOW (ref 3.5–5.0)
Alkaline Phosphatase: 102 U/L (ref 38–126)
Anion gap: 10 (ref 5–15)
BUN: 12 mg/dL (ref 6–20)
CO2: 28 mmol/L (ref 22–32)
Calcium: 8.6 mg/dL — ABNORMAL LOW (ref 8.9–10.3)
Chloride: 98 mmol/L (ref 98–111)
Creatinine, Ser: 0.91 mg/dL (ref 0.61–1.24)
GFR, Estimated: >60 mL/min (ref >60)
Glucose, Bld: 118 mg/dL — ABNORMAL HIGH (ref 70–99)
Potassium: 4.1 mmol/L (ref 3.5–5.1)
Sodium: 136 mmol/L (ref 135–145)
Total Bilirubin: 0.8 mg/dL (ref 0.3–1.2)
Total Protein: 7.9 g/dL (ref 6.5–8.1)

## 2020-11-09 LAB — CBC WITH DIFFERENTIAL/PLATELET
Abs Immature Granulocytes: 0.37 10*3/uL — ABNORMAL HIGH (ref 0.00–0.07)
Basophils Absolute: 0.1 10*3/uL (ref 0.0–0.1)
Basophils Relative: 0 %
Eosinophils Absolute: 0.1 10*3/uL (ref 0.0–0.5)
Eosinophils Relative: 1 %
HCT: 31 % — ABNORMAL LOW (ref 39.0–52.0)
Hemoglobin: 9.8 g/dL — ABNORMAL LOW (ref 13.0–17.0)
Immature Granulocytes: 3 %
Lymphocytes Relative: 12 %
Lymphs Abs: 1.7 10*3/uL (ref 0.7–4.0)
MCH: 30.3 pg (ref 26.0–34.0)
MCHC: 31.6 g/dL (ref 30.0–36.0)
MCV: 96 fL (ref 80.0–100.0)
Monocytes Absolute: 1.1 10*3/uL — ABNORMAL HIGH (ref 0.1–1.0)
Monocytes Relative: 8 %
Neutro Abs: 10.7 10*3/uL — ABNORMAL HIGH (ref 1.7–7.7)
Neutrophils Relative %: 76 %
Platelets: 211 10*3/uL (ref 150–400)
RBC: 3.23 MIL/uL — ABNORMAL LOW (ref 4.22–5.81)
RDW: 14.6 % (ref 11.5–15.5)
WBC: 14 10*3/uL — ABNORMAL HIGH (ref 4.0–10.5)
nRBC: 0 % (ref 0.0–0.2)

## 2020-11-09 LAB — BRAIN NATRIURETIC PEPTIDE: B Natriuretic Peptide: 31.9 pg/mL (ref 0.0–100.0)

## 2020-11-09 LAB — MAGNESIUM: Magnesium: 2 mg/dL (ref 1.7–2.4)

## 2020-11-09 MED ORDER — CLONAZEPAM 0.5 MG PO TABS
0.5000 mg | ORAL_TABLET | Freq: Every day | ORAL | Status: DC | PRN
Start: 2020-11-09 — End: 2020-11-16
  Administered 2020-11-09 – 2020-11-15 (×8): 0.5 mg via ORAL
  Filled 2020-11-09 (×8): qty 1

## 2020-11-09 NOTE — Progress Notes (Signed)
[facility] PROGRESS NOTE    Progress Note  [place]AK[place] DIRKSEN  [id number] DOB: 26-Aug-1966 DOA: 09/26/2020 PCP: Patient, No Pcp Per     Brief Narrative:   Matthew [name] is an 55 y.o. male past medical history significant of CAD status post PCI tobacco abuse hyperlipidemia comes into the hospital with acute respiratory failure due to COVID-19 requiring 100% nonrebreather  COVID-19 vaccine status: Unvaccinated  Significant Events: 12/9>> started having GI symptoms including diarrhea 12/13>> to urgent care-for diarrhea-discharged after supportive care/IV fluids 12/16>> Admit to [facility] for hypoxia due to COVID-19 pneumonia-requiring 100% O2 via NRB 12/28>> hemoptysis-PCCM consulted-supportive care recommended. 12/31>> worsening hypoxemia-started on heated high flow-unable to lie flat for a CTA chest  Significant studies: 12/17>> CTA chest: No PE, diffuse groundglass opacities 12/18>> Echo: EF 60-65% 12/26>> chest x-ray: Diffuse bilateral heterogeneous/interstitial airspace opacities 12/31>> chest x-ray: Stable bilateral lung opacities 12/31>> lower extremity Doppler: No DVT 1/5>> chest x-ray: Mild improvement in multifocal opacities 1/10>> chest x-ray: Diffuse bilateral airspace disease 1/15>> chest x-ray: Persistent diffuse bilateral hazy opacities-similar/slightly improved from prior study   COVID-19 medications: Steroids: 12/16>>1/15 Remdesivir: 12/16>> 12/20 Baricitinib: 12/17>>12/30  Antibiotics: None  Microbiology data: None  Procedures: None  Consults: PCCM  Assessment/Plan:   Acute respiratory failure with hypoxia due to COVID-19 viral pneumonia now in ARDS: Unfortunately now with severe lung injury due to COVID-19 leading to ARDS. He has completed his treatment of IV remdesivir baricitinib's and steroids. He has completed his isolation. He has been weaned to the 7 L, will try to wean to 5 L throughout the day if he remains stable he can  probably discharge him tomorrow.  Physical therapy evaluated the patient recommended skilled nursing facility. Please take the patient out of bed to chair, recommend incentive spirometry and flutter valve. He will need to go home on oxygen. And follow-up with pulmonary as an outpatient.  Elevated D-dimer: CTA was negative x2 along with lower extremity Doppler. Change Lovenox for DVT prophylaxis.  Acute on chronic diastolic heart failure: Appears to be compensated. He has been adequately diuresed.    Hemoptysis: 1 episode on 10/08/2020 now resolved.  Hypotension: Now on low-dose midodrine currently asymptomatic.  Transaminitis: Likely due to COVID-19 if continue to improve.  Bipolar dzd: Cont lexapro.  CAD status post PCI: No anginal symptoms on antiplatelet therapy at home.  GERD: Continue PPI.  DVT prophylaxis: lovenox Family Communication:wife Status is: Inpatient  Remains inpatient appropriate because:Hemodynamically unstable   Dispo: The patient is from: Home              Anticipated d/c is to: Home              Anticipated d/c date is: > 3 days              Patient currently is not medically stable to d/c.   Difficult to place patient No  Code Status:     Code Status Orders  (From admission, onward)         Start     Ordered   09/26/20 2219  Full code  Continuous        09/26/20 2222        Code Status History    Date Active Date Inactive Code Status Order ID Comments User Context   11/17/2019 1618 11/20/2019 1735 Full Code [id number]  Laurin Coder, MD Inpatient   Advance Care Planning Activity        IV Access:    Peripheral  IV   Procedures and diagnostic studies:   No results found.   Medical Consultants:    None.  Anti-Infectives:   none  Subjective:    Matthew [name] chronic his breathing is significantly better.  Objective:    Vitals:   11/08/20 2349 11/09/20 0115 11/09/20 0409 11/09/20 0729  BP: 98/65   (!) 88/68 100/72  Pulse: 79  79 79  Resp: 20  20 18   Temp: 98.5 F (36.9 C)  98.4 F (36.9 C) 98.4 F (36.9 C)  TempSrc: Oral  Oral Oral  SpO2: 95%  97% 98%  Weight:  92.5 kg    Height:       SpO2: 98 % O2 Flow Rate (L/min): 7 L/min FiO2 (%): 50 %   Intake/Output Summary (Last 24 hours) at 11/09/2020 1011 Last data filed at 11/08/2020 2100 Gross per 24 hour  Intake 240 ml  Output 350 ml  Net -110 ml   Filed Weights   11/07/20 0051 11/08/20 0500 11/09/20 0115  Weight: 91.6 kg 89.8 kg 92.5 kg    Exam: General exam: In no acute distress. Respiratory system: Good air movement and diffuse crackles bilaterally Cardiovascular system: S1 & S2 heard, RRR. No JVD. Gastrointestinal system: Abdomen is nondistended, soft and nontender.  Extremities: No pedal edema. Skin: No rashes, lesions or ulcers Psychiatry: Judgement and insight appear normal. Mood & affect appropriate. Data Reviewed:    Labs: Basic Metabolic Panel: Recent Labs  Lab 11/05/20 0423 11/06/20 0302 11/07/20 0155 11/08/20 0545 11/09/20 0452  NA 138 137 137 138 136  K 3.7 3.9 3.5 4.4 4.1  CL 100 99 99 99 98  CO2 27 27 27 29 28   GLUCOSE 97 99 108* 117* 118*  BUN 10 10 10 11 12   CREATININE 1.01 1.06 1.00 1.00 0.91  CALCIUM 8.6* 8.5* 8.3* 8.6* 8.6*  MG 2.2 2.1 2.0 2.0 2.0   GFR Estimated Creatinine Clearance: 105.4 mL/min (by C-G formula based on SCr of 0.91 mg/dL). Liver Function Tests: Recent Labs  Lab 11/05/20 0423 11/06/20 0302 11/07/20 0155 11/08/20 0545 11/09/20 0452  AST 26 27 23 20 20   ALT 32 31 26 26 23   ALKPHOS 96 95 95 104 102  BILITOT 0.7 0.6 1.0 0.6 0.8  PROT 7.6 7.6 7.7 7.7 7.9  ALBUMIN 2.3* 2.4* 2.3* 2.3* 2.3*   No results for input(s): LIPASE, AMYLASE in the last 168 hours. No results for input(s): AMMONIA in the last 168 hours. Coagulation profile No results for input(s): INR, PROTIME in the last 168 hours. COVID-19 Labs  No results for input(s): DDIMER, FERRITIN, LDH, CRP  in the last 72 hours.  Lab Results  Component Value Date   SARSCOV2NAA POSITIVE (A) 09/26/2020   [place] NEGATIVE 02/06/2020   [place] NEGATIVE 11/17/2019    CBC: Recent Labs  Lab 11/05/20 0423 11/06/20 0302 11/07/20 0155 11/08/20 0545 11/09/20 0452  WBC 11.7* 12.4* 13.0* 11.8* 14.0*  NEUTROABS 8.2* 9.1* 10.0* 8.8* 10.7*  HGB 10.9* 10.3* 9.8* 10.2* 9.8*  HCT 33.8* 32.5* 31.2* 32.6* 31.0*  MCV 96.0 96.4 96.6 96.4 96.0  PLT 214 223 207 211 211   Cardiac Enzymes: No results for input(s): CKTOTAL, CKMB, CKMBINDEX, TROPONINI in the last 168 hours. BNP (last 3 results) No results for input(s): PROBNP in the last 8760 hours. CBG: No results for input(s): GLUCAP in the last 168 hours. D-Dimer: No results for input(s): DDIMER in the last 72 hours. Hgb A1c: No results for input(s): HGBA1C in the last  72 hours. Lipid Profile: No results for input(s): CHOL, HDL, LDLCALC, TRIG, CHOLHDL, LDLDIRECT in the last 72 hours. Thyroid function studies: No results for input(s): TSH, T4TOTAL, T3FREE, THYROIDAB in the last 72 hours.  Invalid input(s): FREET3 Anemia work up: No results for input(s): VITAMINB12, FOLATE, FERRITIN, TIBC, IRON, RETICCTPCT in the last 72 hours. Sepsis Labs: Recent Labs  Lab 11/06/20 0302 11/07/20 0155 11/08/20 0545 11/09/20 0452  WBC 12.4* 13.0* 11.8* 14.0*   Microbiology No results found for this or any previous visit (from the past 240 hour(s)).   Medications:   . enoxaparin (LOVENOX) injection  60 mg Subcutaneous Q12H  . escitalopram  20 mg Oral Daily  . feeding supplement  237 mL Oral TID BM  . melatonin  3 mg Oral QHS  . midodrine  10 mg Oral BID WC  . multivitamins with iron  1 tablet Oral Daily  . pantoprazole  40 mg Oral Daily  . sodium chloride flush  3 mL Intravenous Q12H   Continuous Infusions:    LOS: 44 days   Charlynne Cousins  [facility]  11/09/2020, 10:11 AM

## 2020-11-09 NOTE — Progress Notes (Signed)
Pt's wife called just now and she is concerned for him coming home and she said she can't handle by herself. Pt's is refusing to go to rehab So, the pt's wife wants to talk to the MD infront of the patient's about plan of discharge  tomorrow morning. She asked to leave the note for MD.  Pt is in HFNC at 5l/min and oxygen sat is maintained at 90-96.  Will continue to monitor  [name], RN

## 2020-11-09 NOTE — Plan of Care (Signed)
  Problem: Clinical Measurements: Goal: Respiratory complications will improve Outcome: Progressing   Problem: Safety: Goal: Ability to remain free from injury will improve Outcome: Progressing   

## 2020-11-10 ENCOUNTER — Telehealth: Payer: Self-pay | Admitting: Internal Medicine

## 2020-11-10 NOTE — Progress Notes (Signed)
Physical Therapy Treatment Patient Details Name: Matthew [name] MRN: [id number] DOB: [birth year] Today's Date: 11/10/2020    History of Present Illness Pt is a 55 y.o. male admitted 09/26/20 with SOB and GI distress (had declined COVID test when seen in urgent care). Pt now testing (+) COVID-19. Workup for acute hypoxic respiratory failure due to acute COVID-19 viral PNA. Course complicated by hemoptysis, worsening hypoxemis requiring HHFNC; pt unable to lay flat for chest CTA. Negative DVT on 12/31. Repeat CXR 1/10 with diffuse bilateral airspace disease. PMH includes CAD, CHF, tobacco use (quit 11/2019); pt unvaccinated.    PT Comments    Continuing work on functional mobility and activity tolerance;  Notable that Matthew [name] has the immediate srtength to power up to stand, but does not have the endurance, tending to desat to low 80s even with increasing supplemental O2 to 11 L via HFNC for activity; after the first unsuccessful attempt, we discussed ways to manage anxiety, and he was successful the second try on 12 L; took approx 15 minutes of seated rest and focused breathing to get back to high 80s, low 90s and be able to bring supplemental O2 back down to 7L (where we started);   Given his current functional status, I can see why my colleagues have consistently recommended post-acute rehab at SNF to maximize independence and safety with mobility prior to dc home; unfortunately it sounds like SNF is quite difficult to make happen, given he is uninsured, and he is refusing SNF placement as well; It is also worth highlighting that anxiety is a major player in his endurance/functional mobility, and perhaps he will sleep better and be less anxious at home;   In order for him to be able to dc home, it would need to be initially at a mostly wheelchair level, and even at wheelchair level, he would potentially need assistance for pushing wheelchair to get around; would highly recommend HHPT/OT  therapies for continuing work on endurance, activity tolerance -- but am not sure that these services are available to him; At home, people also tend to simply have no choice but to get up and walk -- so home might be the most therapeutic place for Matthew [name] -- noted pt's wife is nervous about her ability to manage his needs at home, which again, points to getting as much services in the home as possible    Follow Up Recommendations  SNF;Supervision - Intermittent;Supervision for mobility/OOB;Other (comment) (SNF remains appropriate, however it appears it is not an option; in that case recommend HHPT/OT/Aide/RN follow up, but given insurance status, unsure that [facility] services are available to him; Highly recommend Counseling/Mental Health Therapy follow-up, this is possible via Tele-Health)     Equipment Recommendations  Wheelchair (measurements PT);Wheelchair cushion (measurements PT) (Oxygen)  Consider a Hospital Bed (though if he needs trunk elevated, he can sleep in his recliner), and perhaps an ambulance ride home (though I'm not sure about affordability); he has 3 steps to enter with a rail on the right -- recommend placing a chair right at the top of the last step, so Matthew [name] can immediately sit and recover   Recommendations for Other Services  Consider Palliative Care Team Consult for a holistic approach to symptom management     Precautions / Restrictions Precautions Precautions: Fall Precaution Comments: anxiety, monitor sats    Mobility  Bed Mobility Overal bed mobility: Needs Assistance Bed Mobility: Rolling;Sidelying to Sit Rolling: Modified independent (Device/Increase time) Sidelying to sit: Min assist  General bed mobility comments: Employed Hook-arm technqiue to assist in elevating trunk to sit; Pt's wife, Beverlee Nims joined [place] later in session, and observed this technqiue; she tells me she is familiar and confident with helping Matthew [name] in this way when he needs it; on the initial  attempt to get OOB to the chair, Matthew [name] laid himself back down to the bed, citing too much anxiety, and he could not do it; Gave him time to recover, and lots of encouragement, and he did reattempt more successfully the second try   Transfers Overall transfer level: Needs assistance Equipment used: Rolling walker (2 wheeled) Transfers: Sit to/from [facility] Sit to Stand: Min guard Stand pivot transfers: Min guard       General transfer comment: [place] for lines and O2 sat monitoring; adequate power for sit to stand  Ambulation/Gait             General Gait Details: Declined walking in room this session   Stairs             Wheelchair Mobility    Modified Rankin (Stroke Patients Only)       Balance     Sitting balance-Leahy Scale: Good       Standing balance-Leahy Scale: Poor Standing balance comment: Reliant on UE support                            Cognition Arousal/Alertness: Awake/alert Behavior During Therapy: WFL for tasks assessed/performed;Anxious Overall Cognitive Status: Within Functional Limits for tasks assessed                                 General Comments: New anxiety since COVID. When anxiety is high pt takes short gasping breaths and struggles to slow breathing, despite cues and relaxation techniques; Took extra time to discuss recognizing when anxiety arises, and "telling" the anxiety, "I see you, Anxiety. You are telling me I'm in danger, and thank you, but I've done this (getting up, walking) before, and I know I'm OK; I know I can do this"      Exercises      General Comments General comments (skin integrity, edema, etc.): Initiated session on 7 L and O2 sats ranged 87-92%; Incr to 11L with simple activity OOB to chair, and O2 sats decr to 82% observed lowest; incr to 90 wit at least 15 minutes of seated rest; able to decr back to 7L      Pertinent Vitals/Pain Pain Assessment: No/denies  pain Faces Pain Scale: No hurt Pain Intervention(s): Monitored during session    Home Living                      Prior Function            PT Goals (current goals can now be found in the care plan section) Acute Rehab PT Goals Patient Stated Goal: get past breathing issues PT Goal Formulation: With patient Time For Goal Achievement: 11/24/20 Potential to Achieve Goals: Fair Progress towards PT goals: Progressing toward goals (slowly)    Frequency    Min 3X/week      PT Plan Current plan remains appropriate;Discharge plan needs to be updated    Co-evaluation              AM-PAC PT "6 Clicks" Mobility   Outcome Measure  Help needed turning from your  back to your side while in a flat bed without using bedrails?: None Help needed moving from lying on your back to sitting on the side of a flat bed without using bedrails?: A Little Help needed moving to and from a bed to a chair (including a wheelchair)?: A Little Help needed standing up from a chair using your arms (e.g., wheelchair or bedside chair)?: A Little Help needed to walk in hospital room?: A Little Help needed climbing 3-5 steps with a railing? : A Little 6 Click Score: 19    End of Session Equipment Utilized During Treatment: Oxygen Activity Tolerance: Patient tolerated treatment well;Other (comment) (still with significant desaturation and anxiety) Patient left: in chair;with call bell/phone within reach;with family/visitor present Nurse Communication: Mobility status PT Visit Diagnosis: Other abnormalities of gait and mobility (R26.89)     Time: 1950-9326 PT Time Calculation (min) (ACUTE ONLY): 44 min  Charges:  $Therapeutic Activity: 38-52 mins                     Roney Marion, PT  [place] Pager [phone] Office [place] 11/10/2020, 1:37 PM

## 2020-11-10 NOTE — Telephone Encounter (Signed)
Post covid fibrosis. Please arrange first avail with myself or Dr Vaughan Browner. Referred by [facility]

## 2020-11-10 NOTE — Plan of Care (Signed)
  Problem: Activity: Goal: Risk for activity intolerance will decrease Outcome: Progressing   Problem: Coping: Goal: Level of anxiety will decrease Outcome: Progressing   Problem: Safety: Goal: Ability to remain free from injury will improve Outcome: Progressing   

## 2020-11-10 NOTE — Progress Notes (Signed)
[facility] PROGRESS NOTE    Progress Note  SAMARTH STORMER  [id number] DOB: 1966/04/03 DOA: 09/26/2020 PCP: Patient, No Pcp Per     Brief Narrative:   KAYSTON BURKART is an 55 y.o. male past medical history significant of CAD status post PCI tobacco abuse hyperlipidemia comes into the hospital with acute respiratory failure due to COVID-19 requiring 100% nonrebreather  COVID-19 vaccine status: Unvaccinated  Significant Events: 12/9>> started having GI symptoms including diarrhea 12/13>> to urgent care-for diarrhea-discharged after supportive care/IV fluids 12/16>> Admit to [facility] for hypoxia due to COVID-19 pneumonia-requiring 100% O2 via NRB 12/28>> hemoptysis-PCCM consulted-supportive care recommended. 12/31>> worsening hypoxemia-started on heated high flow-unable to lie flat for a CTA chest  Significant studies: 12/17>> CTA chest: No PE, diffuse groundglass opacities 12/18>> Echo: EF 60-65% 12/26>> chest x-ray: Diffuse bilateral heterogeneous/interstitial airspace opacities 12/31>> chest x-ray: Stable bilateral lung opacities 12/31>> lower extremity Doppler: No DVT 1/5>> chest x-ray: Mild improvement in multifocal opacities 1/10>> chest x-ray: Diffuse bilateral airspace disease 1/15>> chest x-ray: Persistent diffuse bilateral hazy opacities-similar/slightly improved from prior study   COVID-19 medications: Steroids: 12/16>>1/15 Remdesivir: 12/16>> 12/20 Baricitinib: 12/17>>12/30  Antibiotics: None  Microbiology data: None  Procedures: None  Consults: PCCM  Assessment/Plan:   Acute respiratory failure with hypoxia due to COVID-19 viral pneumonia now in ARDS: Unfortunately now with severe lung injury due to COVID-19 leading to ARDS. He has completed his treatment of IV remdesivir baricitinib's and steroids. He has completed his isolation. He is still requiring 7 L oxygen to keep saturations greater than 92%, this morning he was weaned to  5 L and he desatted to 78. Encourage incentive spirometry out of bed to chair. Physical therapy to evaluate again last time they saw him they recommended skilled nursing facility the patient has refused adamantly skilled nursing facility placement. Arrange for home tomorrow. And follow-up with pulmonary as an outpatient.  Elevated D-dimer: CTA was negative x2 along with lower extremity Doppler. Change Lovenox for DVT prophylaxis.  Acute on chronic diastolic heart failure: Appears to be compensated. He has been adequately diuresed.    Hemoptysis: 1 episode on 10/08/2020 now resolved.  Hypotension: Now on low-dose midodrine currently asymptomatic.  Transaminitis: Likely due to COVID-19 if continue to improve.  Bipolar dzd: Cont lexapro.  CAD status post PCI: No anginal symptoms on antiplatelet therapy at home.  GERD: Continue PPI.  DVT prophylaxis: lovenox Family Communication:wife Status is: Inpatient  Remains inpatient appropriate because:Hemodynamically unstable   Dispo: The patient is from: Home              Anticipated d/c is to: Home              Anticipated d/c date is: > 3 days              Patient currently is not medically stable to d/c.   Difficult to place patient No  Code Status:     Code Status Orders  (From admission, onward)         Start     Ordered   09/26/20 2219  Full code  Continuous        09/26/20 2222        Code Status History    Date Active Date Inactive Code Status Order ID Comments User Context   11/17/2019 1618 11/20/2019 1735 Full Code [id number]  Laurin Coder, MD Inpatient   Advance Care Planning Activity        IV Access:  Peripheral IV   Procedures and diagnostic studies:   No results found.   Medical Consultants:    None.  Anti-Infectives:   none  Subjective:    EDWORD CU relates his breathing is better  Objective:    Vitals:   11/09/20 1553 11/09/20 1947 11/10/20 0023 11/10/20  0450  BP: 97/62 98/66 (!) 91/57 (!) 141/45  Pulse: 88 83 72 73  Resp: 18 20 20 20   Temp: 98.4 F (36.9 C) 98.6 F (37 C) 98.4 F (36.9 C) 98.5 F (36.9 C)  TempSrc: Oral Oral Oral Oral  SpO2: 97% 92% 100% 100%  Weight:   91.2 kg   Height:       SpO2: 100 % O2 Flow Rate (L/min): 7 L/min FiO2 (%): 50 %   Intake/Output Summary (Last 24 hours) at 11/10/2020 3875 Last data filed at 11/09/2020 2100 Gross per 24 hour  Intake 780 ml  Output 800 ml  Net -20 ml   Filed Weights   11/08/20 0500 11/09/20 0115 11/10/20 0023  Weight: 89.8 kg 92.5 kg 91.2 kg    Exam: General exam: In no acute distress. Respiratory system: Good air movement and diffuse crackles Cardiovascular system: S1 & S2 heard, RRR. No JVD. Gastrointestinal system: Abdomen is nondistended, soft and nontender.  Extremities: No pedal edema. Skin: No rashes, lesions or ulcers Psychiatry: Judgement and insight appear normal. Mood & affect appropriate. Data Reviewed:    Labs: Basic Metabolic Panel: Recent Labs  Lab 11/05/20 0423 11/06/20 0302 11/07/20 0155 11/08/20 0545 11/09/20 0452  NA 138 137 137 138 136  K 3.7 3.9 3.5 4.4 4.1  CL 100 99 99 99 98  CO2 27 27 27 29 28   GLUCOSE 97 99 108* 117* 118*  BUN 10 10 10 11 12   CREATININE 1.01 1.06 1.00 1.00 0.91  CALCIUM 8.6* 8.5* 8.3* 8.6* 8.6*  MG 2.2 2.1 2.0 2.0 2.0   GFR Estimated Creatinine Clearance: 104.7 mL/min (by C-G formula based on SCr of 0.91 mg/dL). Liver Function Tests: Recent Labs  Lab 11/05/20 0423 11/06/20 0302 11/07/20 0155 11/08/20 0545 11/09/20 0452  AST 26 27 23 20 20   ALT 32 31 26 26 23   ALKPHOS 96 95 95 104 102  BILITOT 0.7 0.6 1.0 0.6 0.8  PROT 7.6 7.6 7.7 7.7 7.9  ALBUMIN 2.3* 2.4* 2.3* 2.3* 2.3*   No results for input(s): LIPASE, AMYLASE in the last 168 hours. No results for input(s): AMMONIA in the last 168 hours. Coagulation profile No results for input(s): INR, PROTIME in the last 168 hours. COVID-19 Labs  No  results for input(s): DDIMER, FERRITIN, LDH, CRP in the last 72 hours.  Lab Results  Component Value Date   SARSCOV2NAA POSITIVE (A) 09/26/2020   [place] NEGATIVE 02/06/2020   [place] NEGATIVE 11/17/2019    CBC: Recent Labs  Lab 11/05/20 0423 11/06/20 0302 11/07/20 0155 11/08/20 0545 11/09/20 0452  WBC 11.7* 12.4* 13.0* 11.8* 14.0*  NEUTROABS 8.2* 9.1* 10.0* 8.8* 10.7*  HGB 10.9* 10.3* 9.8* 10.2* 9.8*  HCT 33.8* 32.5* 31.2* 32.6* 31.0*  MCV 96.0 96.4 96.6 96.4 96.0  PLT 214 223 207 211 211   Cardiac Enzymes: No results for input(s): CKTOTAL, CKMB, CKMBINDEX, TROPONINI in the last 168 hours. BNP (last 3 results) No results for input(s): PROBNP in the last 8760 hours. CBG: No results for input(s): GLUCAP in the last 168 hours. D-Dimer: No results for input(s): DDIMER in the last 72 hours. Hgb A1c: No results for input(s): HGBA1C  in the last 72 hours. Lipid Profile: No results for input(s): CHOL, HDL, LDLCALC, TRIG, CHOLHDL, LDLDIRECT in the last 72 hours. Thyroid function studies: No results for input(s): TSH, T4TOTAL, T3FREE, THYROIDAB in the last 72 hours.  Invalid input(s): FREET3 Anemia work up: No results for input(s): VITAMINB12, FOLATE, FERRITIN, TIBC, IRON, RETICCTPCT in the last 72 hours. Sepsis Labs: Recent Labs  Lab 11/06/20 0302 11/07/20 0155 11/08/20 0545 11/09/20 0452  WBC 12.4* 13.0* 11.8* 14.0*   Microbiology No results found for this or any previous visit (from the past 240 hour(s)).   Medications:   . enoxaparin (LOVENOX) injection  60 mg Subcutaneous Q12H  . escitalopram  20 mg Oral Daily  . feeding supplement  237 mL Oral TID BM  . melatonin  3 mg Oral QHS  . midodrine  10 mg Oral BID WC  . multivitamins with iron  1 tablet Oral Daily  . pantoprazole  40 mg Oral Daily  . sodium chloride flush  3 mL Intravenous Q12H   Continuous Infusions:    LOS: 45 days   Charlynne Cousins  [facility]  11/10/2020, 8:12 AM

## 2020-11-11 MED ORDER — ENOXAPARIN SODIUM 40 MG/0.4ML ~~LOC~~ SOLN
40.0000 mg | Freq: Every day | SUBCUTANEOUS | Status: DC
  Administered 2020-11-11 – 2020-11-15 (×5): 40 mg via SUBCUTANEOUS
  Filled 2020-11-11 (×5): qty 0.4

## 2020-11-11 NOTE — Telephone Encounter (Signed)
Patient still admitted. Scheduled pulmonary consult with MR on 12/10/2020 so that it will show up on his discharge summary -pr

## 2020-11-11 NOTE — Progress Notes (Signed)
Physical Therapy Treatment Patient Details Name: Matthew [name] MRN: [id number] DOB: 1965/11/07 Today's Date: 11/11/2020    History of Present Illness Pt is a 55 y.o. male admitted 09/26/20 with SOB and GI distress (had declined COVID test when seen in urgent care). Pt now testing (+) COVID-19. Workup for acute hypoxic respiratory failure due to acute COVID-19 viral PNA. Course complicated by hemoptysis, worsening hypoxemis requiring HHFNC; pt unable to lay flat for chest CTA. Negative DVT on 12/31. Repeat CXR 1/10 with diffuse bilateral airspace disease. PMH includes CAD, CHF, tobacco use (quit 11/2019); pt unvaccinated.    PT Comments    Continuing work on functional mobility and activity tolerance;  Session focused on mobility OOB to chair; Anxiety still playing a significant role in limiting mobility -- took time to acknowledge and address the anxiety, while still knowing that the best way to increase function, be able to move and take care of himself, is to breathe and move slowly and with control -- even when anxiety is telling him to breathe faster;   Initiated session on 4L O2 via HFNC; incr to 6L in prep for moving; Got up to EOB with min handheld assist to pull to it, O2 sats decr to low 80s, and pt laid himself back down; Continuing work on controlled breathing, titrated O2 up to 8L, and pt was able to get up to EOB and to the recliner with min assist and use of RW; Settled in the recliner, O2 sats down to 81- 84% on 8L HFNC; seated rest for 5 minutes, and O2 sats still in mid 80s; Ended session with Nurse Tech in room, pt's feet up in recliner, and pt himself asking to try and titrate O2 back down; On 7L via HFNC;   Updated dc recs to home with HHPT; [name] and I discussed considerations for dc home; In particular, recommend pt wheels to the steps to enter his home, wait while his wife places a chair and the O2 tank at the top step, then go up the steps and immediately sit down to recover  (to keep things simple, PTAR transport home would be helpful -- I anticipate he would decline); I'm hopeful that home will be the most therapeutic place for [facility]  Follow Up Recommendations  Home health PT;Supervision - Intermittent; [place] and HHRN for chronic disease management     Equipment Recommendations  Wheelchair (measurements PT);Wheelchair cushion (measurements PT) (Oxygen)  Rollator RW to decr the work of walking, and allow for seated rest whenever he needs   Recommendations for Other Services Other (comment) (Palliative Care for a holistic approach to symptom management)     Precautions / Restrictions Precautions Precautions: Fall Precaution Comments: anxiety, monitor sats    Mobility  Bed Mobility Overal bed mobility: Needs Assistance Bed Mobility: Rolling;Sidelying to Sit Rolling: Independent Sidelying to sit: Min assist   Sit to supine: Modified independent (Device/Increase time)   General bed mobility comments: min handheld assist to pull to sit  Transfers Overall transfer level: Needs assistance Equipment used: Rolling walker (2 wheeled) Transfers: Sit to/from [facility] Sit to Stand: Min guard Stand pivot transfers: Min guard       General transfer comment: Minguard for lines and O2 sat monitoring; adequate power for sit to stand  Ambulation/Gait                 Marine scientist  Rankin (Stroke Patients Only)       Balance     Sitting balance-Leahy Scale: Good       Standing balance-Leahy Scale: Poor Standing balance comment: Reliant on UE support                            Cognition Arousal/Alertness: Awake/alert Behavior During Therapy: WFL for tasks assessed/performed;Anxious (Anxiety increases with moving) Overall Cognitive Status: Within Functional Limits for tasks assessed                                 General Comments: New anxiety since  COVID. When anxiety is high pt takes short gasping breaths and struggles to slow breathing, despite cues and relaxation techniques; Took extra time to discuss recognizing when anxiety arises, and "telling" the anxiety, "I see you, Anxiety. You are telling me I'm in danger, and thank you, but I've done this (getting up, walking) before, and I know I'm OK; I know I can do this; Anxiety -- I know you want me to do this fast, but the right way to do it is to breathe slowly""      Exercises      General Comments General comments (skin integrity, edema, etc.): Left written HEP with seated exercises and standing exercises in the room; unable to go over them with pt (focused session on mobility and transfers OOB); plan to bring Theraband to use for UE exercises      Pertinent Vitals/Pain Pain Assessment: No/denies pain Pain Intervention(s): Monitored during session    Home Living                      Prior Function            PT Goals (current goals can now be found in the care plan section) Acute Rehab PT Goals Patient Stated Goal: get past breathing issues PT Goal Formulation: With patient Time For Goal Achievement: 11/24/20 Potential to Achieve Goals: Fair Progress towards PT goals: Progressing toward goals (Slowly)    Frequency    Min 3X/week      PT Plan Discharge plan needs to be updated    Co-evaluation              AM-PAC PT "6 Clicks" Mobility   Outcome Measure  Help needed turning from your back to your side while in a flat bed without using bedrails?: None Help needed moving from lying on your back to sitting on the side of a flat bed without using bedrails?: A Little Help needed moving to and from a bed to a chair (including a wheelchair)?: A Little Help needed standing up from a chair using your arms (e.g., wheelchair or bedside chair)?: A Little Help needed to walk in hospital room?: A Little Help needed climbing 3-5 steps with a railing? : A  Little 6 Click Score: 19    End of Session Equipment Utilized During Treatment: Oxygen Activity Tolerance: Patient tolerated treatment well;Other (comment) (still with significant desaturation and anxiety) Patient left: in chair;with call bell/phone within reach;with nursing/sitter in room Nurse Communication: Mobility status PT Visit Diagnosis: Other abnormalities of gait and mobility (R26.89)     Time: 1112-1200 PT Time Calculation (min) (ACUTE ONLY): 48 min  Charges:  $Therapeutic Activity: 38-52 mins  Roney [place], [place]  Acute Rehabilitation Services Pager [phone] Office [phone]    Colletta [place] 11/11/2020, 2:00 PM

## 2020-11-11 NOTE — Progress Notes (Signed)
[facility] PROGRESS NOTE    Progress Note  Matthew [name]  EYC:[id number] DOB: [birth year] DOA: 09/26/2020 PCP: Patient, No Pcp Per     Brief Narrative:   Matthew [name] is an 55 y.o. male past medical history significant of CAD status post PCI tobacco abuse hyperlipidemia comes into the hospital with acute respiratory failure due to COVID-19 requiring 100% nonrebreather  COVID-19 vaccine status: Unvaccinated  Significant Events: 12/9>> started having GI symptoms including diarrhea 12/13>> to urgent care-for diarrhea-discharged after supportive care/IV fluids 12/16>> Admit to [facility] for hypoxia due to COVID-19 pneumonia-requiring 100% O2 via NRB 12/28>> hemoptysis-PCCM consulted-supportive care recommended. 12/31>> worsening hypoxemia-started on heated high flow-unable to lie flat for a CTA chest  Significant studies: 12/17>> CTA chest: No PE, diffuse groundglass opacities 12/18>> Echo: EF 60-65% 12/26>> chest x-ray: Diffuse bilateral heterogeneous/interstitial airspace opacities 12/31>> chest x-ray: Stable bilateral lung opacities 12/31>> lower extremity Doppler: No DVT 1/5>> chest x-ray: Mild improvement in multifocal opacities 1/10>> chest x-ray: Diffuse bilateral airspace disease 1/15>> chest x-ray: Persistent diffuse bilateral hazy opacities-similar/slightly improved from prior study   COVID-19 medications: Steroids: 12/16>>1/15 Remdesivir: 12/16>> 12/20 Baricitinib: 12/17>>12/30  Antibiotics: None  Microbiology data: None  Procedures: None  Consults: PCCM  Assessment/Plan:   Acute respiratory failure with hypoxia due to COVID-19 viral pneumonia now in ARDS: Unfortunately now with severe lung injury due to COVID-19 leading to ARDS. He is now requiring 5 L of of high flow nasal cannula to keep saturations greater than 92%. We will try to wean to 4 L of nasal cannula try to get him home tomorrow. Continue to encourage the patient to use  incentive spirometry, encourage strongly out of bed to chair. Physical therapy evaluated the patient recommended skilled nursing facility which the patient has refused with financial reasons. We will schedule him an appointment with pulmonary as an outpatient in the pulmonary fibrosis clinic.  Elevated D-dimer: CTA was negative x2 along with lower extremity Doppler. Change Lovenox for DVT prophylaxis.  Acute on chronic diastolic heart failure: Appears to be compensated. He has been adequately diuresed.    Hemoptysis: 1 episode on 10/08/2020 now resolved.  Hypotension: Now on low-dose midodrine currently asymptomatic.  Transaminitis: Likely due to COVID-19 if continue to improve.  Bipolar dzd: Cont lexapro.  CAD status post PCI: No anginal symptoms on antiplatelet therapy at home.  GERD: Continue PPI.  DVT prophylaxis: lovenox Family Communication:wife Status is: Inpatient  Remains inpatient appropriate because:Hemodynamically unstable   Dispo: The patient is from: Home              Anticipated d/c is to: Home              Anticipated d/c date is: > 3 days              Patient currently is not medically stable to d/c.   Difficult to place patient No  Code Status:     Code Status Orders  (From admission, onward)         Start     Ordered   09/26/20 2219  Full code  Continuous        09/26/20 2222        Code Status History    Date Active Date Inactive Code Status Order ID Comments User Context   11/17/2019 1618 11/20/2019 1735 Full Code [id number]  Laurin Coder, MD Inpatient   Advance Care Planning Activity        IV Access:    Peripheral  IV   Procedures and diagnostic studies:   No results found.   Medical Consultants:    None.  Anti-Infectives:   none  Subjective:    Matthew [name] relates he has noticed no change in his breathing over the last 24 hours.  Objective:    Vitals:   11/11/20 0017 11/11/20 0331 11/11/20  0500 11/11/20 0757  BP: (!) 84/61 (!) 91/53  93/62  Pulse: 80 77  74  Resp: 20 19  18   Temp: 99.3 F (37.4 C) 98.2 F (36.8 C)  98.4 F (36.9 C)  TempSrc: Oral Oral  Oral  SpO2: 94% 96%  94%  Weight:   93 kg   Height:       SpO2: 94 % O2 Flow Rate (L/min): 5 L/min FiO2 (%): 50 %   Intake/Output Summary (Last 24 hours) at 11/11/2020 0932 Last data filed at 11/11/2020 0017 Gross per 24 hour  Intake 3 ml  Output 775 ml  Net -772 ml   Filed Weights   11/10/20 0023 11/11/20 0017 11/11/20 0500  Weight: 91.2 kg 93 kg 93 kg    Exam: General exam: In no acute distress. Respiratory system: Good air movement and clear to auscultation. Cardiovascular system: S1 & S2 heard, RRR. No JVD. Gastrointestinal system: Abdomen is nondistended, soft and nontender.  Extremities: No pedal edema. Skin: No rashes, lesions or ulcers Psychiatry: Judgement and insight appear normal. Mood & affect appropriate. Data Reviewed:    Labs: Basic Metabolic Panel: Recent Labs  Lab 11/05/20 0423 11/06/20 0302 11/07/20 0155 11/08/20 0545 11/09/20 0452  NA 138 137 137 138 136  K 3.7 3.9 3.5 4.4 4.1  CL 100 99 99 99 98  CO2 27 27 27 29 28   GLUCOSE 97 99 108* 117* 118*  BUN 10 10 10 11 12   CREATININE 1.01 1.06 1.00 1.00 0.91  CALCIUM 8.6* 8.5* 8.3* 8.6* 8.6*  MG 2.2 2.1 2.0 2.0 2.0   GFR Estimated Creatinine Clearance: 105.6 mL/min (by C-G formula based on SCr of 0.91 mg/dL). Liver Function Tests: Recent Labs  Lab 11/05/20 0423 11/06/20 0302 11/07/20 0155 11/08/20 0545 11/09/20 0452  AST 26 27 23 20 20   ALT 32 31 26 26 23   ALKPHOS 96 95 95 104 102  BILITOT 0.7 0.6 1.0 0.6 0.8  PROT 7.6 7.6 7.7 7.7 7.9  ALBUMIN 2.3* 2.4* 2.3* 2.3* 2.3*   No results for input(s): LIPASE, AMYLASE in the last 168 hours. No results for input(s): AMMONIA in the last 168 hours. Coagulation profile No results for input(s): INR, PROTIME in the last 168 hours. COVID-19 Labs  No results for input(s):  DDIMER, FERRITIN, LDH, CRP in the last 72 hours.  Lab Results  Component Value Date   SARSCOV2NAA POSITIVE (A) 09/26/2020   [place] NEGATIVE 02/06/2020   [place] NEGATIVE 11/17/2019    CBC: Recent Labs  Lab 11/05/20 0423 11/06/20 0302 11/07/20 0155 11/08/20 0545 11/09/20 0452  WBC 11.7* 12.4* 13.0* 11.8* 14.0*  NEUTROABS 8.2* 9.1* 10.0* 8.8* 10.7*  HGB 10.9* 10.3* 9.8* 10.2* 9.8*  HCT 33.8* 32.5* 31.2* 32.6* 31.0*  MCV 96.0 96.4 96.6 96.4 96.0  PLT 214 223 207 211 211   Cardiac Enzymes: No results for input(s): CKTOTAL, CKMB, CKMBINDEX, TROPONINI in the last 168 hours. BNP (last 3 results) No results for input(s): PROBNP in the last 8760 hours. CBG: No results for input(s): GLUCAP in the last 168 hours. D-Dimer: No results for input(s): DDIMER in the last 72 hours. Hgb  A1c: No results for input(s): HGBA1C in the last 72 hours. Lipid Profile: No results for input(s): CHOL, HDL, LDLCALC, TRIG, CHOLHDL, LDLDIRECT in the last 72 hours. Thyroid function studies: No results for input(s): TSH, T4TOTAL, T3FREE, THYROIDAB in the last 72 hours.  Invalid input(s): FREET3 Anemia work up: No results for input(s): VITAMINB12, FOLATE, FERRITIN, TIBC, IRON, RETICCTPCT in the last 72 hours. Sepsis Labs: Recent Labs  Lab 11/06/20 0302 11/07/20 0155 11/08/20 0545 11/09/20 0452  WBC 12.4* 13.0* 11.8* 14.0*   Microbiology No results found for this or any previous visit (from the past 240 hour(s)).   Medications:   . enoxaparin (LOVENOX) injection  40 mg Subcutaneous QHS  . escitalopram  20 mg Oral Daily  . feeding supplement  237 mL Oral TID BM  . melatonin  3 mg Oral QHS  . midodrine  10 mg Oral BID WC  . multivitamins with iron  1 tablet Oral Daily  . pantoprazole  40 mg Oral Daily  . sodium chloride flush  3 mL Intravenous Q12H   Continuous Infusions:    LOS: 46 days   Matthew [name]  [facility]  11/11/2020, 9:32 AM

## 2020-11-11 NOTE — Progress Notes (Signed)
This RN has assumed care for this patient at 12:30pm from previous RN.

## 2020-11-11 NOTE — Progress Notes (Signed)
   11/11/20 1100  Mobility  Activity Refused mobility (Pt prefer PT/OT/Mobility after 2pm. Will check back as time permits)

## 2020-11-12 NOTE — Progress Notes (Signed)
[facility] PROGRESS NOTE    Progress Note  Matthew [name]  EUM:[id number] DOB: 05/22/1966 DOA: 09/26/2020 PCP: Patient, No Pcp Per     Brief Narrative:   Matthew [name] is an 55 y.o. male past medical history significant of CAD status post PCI tobacco abuse hyperlipidemia comes into the hospital with acute respiratory failure due to COVID-19 requiring 100% nonrebreather  COVID-19 vaccine status: Unvaccinated  Significant Events: 12/9>> started having GI symptoms including diarrhea 12/13>> to urgent care-for diarrhea-discharged after supportive care/IV fluids 12/16>> Admit to [facility] for hypoxia due to COVID-19 pneumonia-requiring 100% O2 via NRB 12/28>> hemoptysis-PCCM consulted-supportive care recommended. 12/31>> worsening hypoxemia-started on heated high flow-unable to lie flat for a CTA chest  Significant studies: 12/17>> CTA chest: No PE, diffuse groundglass opacities 12/18>> Echo: EF 60-65% 12/26>> chest x-ray: Diffuse bilateral heterogeneous/interstitial airspace opacities 12/31>> chest x-ray: Stable bilateral lung opacities 12/31>> lower extremity Doppler: No DVT 1/5>> chest x-ray: Mild improvement in multifocal opacities 1/10>> chest x-ray: Diffuse bilateral airspace disease 1/15>> chest x-ray: Persistent diffuse bilateral hazy opacities-similar/slightly improved from prior study   COVID-19 medications: Steroids: 12/16>>1/15 Remdesivir: 12/16>> 12/20 Baricitinib: 12/17>>12/30  Antibiotics: None  Microbiology data: None  Procedures: None  Consults: PCCM  Assessment/Plan:   Acute respiratory failure with hypoxia due to COVID-19 viral pneumonia now in ARDS: Unfortunately now with severe lung injury due to COVID-19 leading to ARDS. He still requiring 5 to 4 L of nasal cannula to keep saturations greater than 92%. He was placed on for now has been satting 84% on 4 L of oxygen we will watch him for the next 24 hours. We will see how he  does on 4 L of nasal cannula try to get him home tomorrow. Continue to encourage the patient to use incentive spirometry, encourage strongly out of bed to chair. Physical therapy evaluated the patient recommended skilled nursing facility which the patient has refused with financial reasons. We will schedule him an appointment with pulmonary as an outpatient in the pulmonary fibrosis clinic.  Elevated D-dimer: CTA was negative x2 along with lower extremity Doppler. Change Lovenox for DVT prophylaxis.  Acute on chronic diastolic heart failure: Appears to be compensated. He has been adequately diuresed.    Hemoptysis: 1 episode on 10/08/2020 now resolved.  Hypotension: Now on low-dose midodrine currently asymptomatic.  Transaminitis: Likely due to COVID-19 if continue to improve.  Bipolar dzd: Cont lexapro.  CAD status post PCI: No anginal symptoms on antiplatelet therapy at home.  GERD: Continue PPI.  DVT prophylaxis: lovenox Family Communication:wife Status is: Inpatient  Remains inpatient appropriate because:Hemodynamically unstable   Dispo: The patient is from: Home              Anticipated d/c is to: Home              Anticipated d/c date is: > 3 days              Patient currently is not medically stable to d/c.   Difficult to place patient No  Code Status:     Code Status Orders  (From admission, onward)         Start     Ordered   09/26/20 2219  Full code  Continuous        09/26/20 2222        Code Status History    Date Active Date Inactive Code Status Order ID Comments User Context   11/17/2019 1618 11/20/2019 1735 Full Code [id number]  Ander Slade,  Ernesto Rutherford, MD Inpatient   Advance Care Planning Activity        IV Access:    Peripheral IV   Procedures and diagnostic studies:   No results found.   Medical Consultants:    None.  Anti-Infectives:   none  Subjective:    Matthew [name] relates he feels significant short of  breath with ambulation  Objective:    Vitals:   11/11/20 1906 11/11/20 2354 11/12/20 0536 11/12/20 0732  BP: 106/68 101/63 (!) 116/56 98/70  Pulse:  74 75 70  Resp: 20 19 18 18   Temp: 97.8 F (36.6 C) 98.4 F (36.9 C) 98.2 F (36.8 C) 97.6 F (36.4 C)  TempSrc: Oral Oral Oral Oral  SpO2: 97% 97% 97% 96%  Weight:   92.5 kg   Height:       SpO2: 96 % O2 Flow Rate (L/min): 11 L/min FiO2 (%): 50 %   Intake/Output Summary (Last 24 hours) at 11/12/2020 0809 Last data filed at 11/11/2020 0930 Gross per 24 hour  Intake 0 ml  Output --  Net 0 ml   Filed Weights   11/11/20 0017 11/11/20 0500 11/12/20 0536  Weight: 93 kg 93 kg 92.5 kg    Exam: General exam: In no acute distress. Respiratory system: Good air movement and crackles at bases bilaterally Cardiovascular system: S1 & S2 heard, RRR. No JVD. Gastrointestinal system: Abdomen is nondistended, soft and nontender.  Extremities: No pedal edema. Skin: No rashes, lesions or ulcers  Data Reviewed:    Labs: Basic Metabolic Panel: Recent Labs  Lab 11/06/20 0302 11/07/20 0155 11/08/20 0545 11/09/20 0452  NA 137 137 138 136  K 3.9 3.5 4.4 4.1  CL 99 99 99 98  CO2 27 27 29 28   GLUCOSE 99 108* 117* 118*  BUN 10 10 11 12   CREATININE 1.06 1.00 1.00 0.91  CALCIUM 8.5* 8.3* 8.6* 8.6*  MG 2.1 2.0 2.0 2.0   GFR Estimated Creatinine Clearance: 105.4 mL/min (by C-G formula based on SCr of 0.91 mg/dL). Liver Function Tests: Recent Labs  Lab 11/06/20 0302 11/07/20 0155 11/08/20 0545 11/09/20 0452  AST 27 23 20 20   ALT 31 26 26 23   ALKPHOS 95 95 104 102  BILITOT 0.6 1.0 0.6 0.8  PROT 7.6 7.7 7.7 7.9  ALBUMIN 2.4* 2.3* 2.3* 2.3*   No results for input(s): LIPASE, AMYLASE in the last 168 hours. No results for input(s): AMMONIA in the last 168 hours. Coagulation profile No results for input(s): INR, PROTIME in the last 168 hours. COVID-19 Labs  No results for input(s): DDIMER, FERRITIN, LDH, CRP in the last 72  hours.  Lab Results  Component Value Date   SARSCOV2NAA POSITIVE (A) 09/26/2020   [place] NEGATIVE 02/06/2020   [place] NEGATIVE 11/17/2019    CBC: Recent Labs  Lab 11/06/20 0302 11/07/20 0155 11/08/20 0545 11/09/20 0452  WBC 12.4* 13.0* 11.8* 14.0*  NEUTROABS 9.1* 10.0* 8.8* 10.7*  HGB 10.3* 9.8* 10.2* 9.8*  HCT 32.5* 31.2* 32.6* 31.0*  MCV 96.4 96.6 96.4 96.0  PLT 223 207 211 211   Cardiac Enzymes: No results for input(s): CKTOTAL, CKMB, CKMBINDEX, TROPONINI in the last 168 hours. BNP (last 3 results) No results for input(s): PROBNP in the last 8760 hours. CBG: No results for input(s): GLUCAP in the last 168 hours. D-Dimer: No results for input(s): DDIMER in the last 72 hours. Hgb A1c: No results for input(s): HGBA1C in the last 72 hours. Lipid Profile: No results for  input(s): CHOL, HDL, LDLCALC, TRIG, CHOLHDL, LDLDIRECT in the last 72 hours. Thyroid function studies: No results for input(s): TSH, T4TOTAL, T3FREE, THYROIDAB in the last 72 hours.  Invalid input(s): FREET3 Anemia work up: No results for input(s): VITAMINB12, FOLATE, FERRITIN, TIBC, IRON, RETICCTPCT in the last 72 hours. Sepsis Labs: Recent Labs  Lab 11/06/20 0302 11/07/20 0155 11/08/20 0545 11/09/20 0452  WBC 12.4* 13.0* 11.8* 14.0*   Microbiology No results found for this or any previous visit (from the past 240 hour(s)).   Medications:   . enoxaparin (LOVENOX) injection  40 mg Subcutaneous QHS  . escitalopram  20 mg Oral Daily  . feeding supplement  237 mL Oral TID BM  . melatonin  3 mg Oral QHS  . midodrine  10 mg Oral BID WC  . multivitamins with iron  1 tablet Oral Daily  . pantoprazole  40 mg Oral Daily  . sodium chloride flush  3 mL Intravenous Q12H   Continuous Infusions:    LOS: 47 days   Charlynne Cousins  [facility]  11/12/2020, 8:09 AM

## 2020-11-12 NOTE — Progress Notes (Signed)
Physical Therapy Treatment Patient Details Name: Matthew [name] MRN: [id number] DOB: [birth year] Today's Date: 11/12/2020    History of Present Illness Pt is a 55 y.o. male admitted 09/26/20 with SOB and GI distress (had declined COVID test when seen in urgent care). Pt now testing (+) COVID-19. Workup for acute hypoxic respiratory failure due to acute COVID-19 viral PNA. Course complicated by hemoptysis, worsening hypoxemis requiring HHFNC; pt unable to lay flat for chest CTA. Negative DVT on 12/31. Repeat CXR 1/10 with diffuse bilateral airspace disease. PMH includes CAD, CHF, tobacco use (quit 11/2019); pt unvaccinated. Off precautions    PT Comments    Pt supine on arrival, agreeable to therapy session with encouragement, pt self-limiting this date and with fair participation. Pt performed rolling x4 reps for bed pad replacement/repositioning, supine>sit with minA via HHA for trunk rise, and modI for sit>supine. Pt encouraged to attempt seated scooting along EOB and sit<>stand transfers however refusing and pt tearful after return to sidelying in bed. Pt given emotional support and handouts for wheelchair ramp building and how to bump wheelchair up steps to enter home, pt appreciative. Pt continues to benefit from PT services to progress toward functional mobility goals. Continue to recommend HHPT, pt will need ramp installed vs PTAR for transport home once medically cleared, pt will likely DC at wheelchair level due to slow progress/decreased activity tolerance.    Follow Up Recommendations  Home health PT;Supervision - Intermittent     Equipment Recommendations  Wheelchair (measurements PT);Wheelchair cushion (measurements PT);Other (comment) (Ramp)    Recommendations for Other Services Other (comment) (Palliative Care for a holistic approach to symptom management)     Precautions / Restrictions Precautions Precautions: Fall Precaution Comments: anxiety, monitor  sats Restrictions Weight Bearing Restrictions: No    Mobility  Bed Mobility Overal bed mobility: Needs Assistance Bed Mobility: Rolling;Sidelying to Sit;Sit to Sidelying Rolling: Independent Sidelying to sit: Min assist   Sit to supine: Modified independent (Device/Increase time)   General bed mobility comments: min handheld assist to pull to sit, pt rolled x4 reps for peri-care/bed pad replacement  Transfers                 General transfer comment: pt refusing to attempt, reports he was up in chair in AM  Ambulation/Gait                 Stairs             Wheelchair Mobility    Modified Rankin (Stroke Patients Only)       Balance Overall balance assessment: Needs assistance Sitting-balance support: Feet supported Sitting balance-Leahy Scale: Good                                      Cognition Arousal/Alertness: Awake/alert Behavior During Therapy: WFL for tasks assessed/performed;Anxious (Anxiety increases with moving) Overall Cognitive Status: Within Functional Limits for tasks assessed                                 General Comments: New anxiety since COVID. When anxiety is high pt takes short gasping breaths and struggles to slow breathing, reviewed slow deep breathing exercise and pt tearful after rolling and supine<>sit<>supine      Exercises General Exercises - Lower Extremity Ankle Circles/Pumps: Both;10 reps;AROM    General Comments General comments (skin integrity,  edema, etc.): Pt given handout for wheelchair bumping up steps (pt will give to spouse when she comes back), pt also given ramp building instructions; SpO2 86-88% with rolling on 10L O2 [place], pt requesting [place] increaed to 12L for EOB sitting and SpO2 desat to 86% seated EOB, pt unable to remain upright more than 1 minute prior to returning to supine      Pertinent Vitals/Pain Pain Assessment: No/denies pain Pain Intervention(s): Monitored  during session    Home Living                      Prior Function            PT Goals (current goals can now be found in the care plan section) Acute Rehab PT Goals Patient Stated Goal: get past breathing issues PT Goal Formulation: With patient Time For Goal Achievement: 11/24/20 Potential to Achieve Goals: Fair Progress towards PT goals: Progressing toward goals (slow progress; pt self-limiting)    Frequency    Min 3X/week      PT Plan Current plan remains appropriate    Co-evaluation              AM-PAC PT "6 Clicks" Mobility   Outcome Measure  Help needed turning from your back to your side while in a flat bed without using bedrails?: None Help needed moving from lying on your back to sitting on the side of a flat bed without using bedrails?: A Little Help needed moving to and from a bed to a chair (including a wheelchair)?: A Little Help needed standing up from a chair using your arms (e.g., wheelchair or bedside chair)?: A Little Help needed to walk in hospital room?: A Little Help needed climbing 3-5 steps with a railing? : A Lot 6 Click Score: 18    End of Session Equipment Utilized During Treatment: Oxygen Activity Tolerance: Other (comment);Patient limited by fatigue (anxiety/fatigue limiting; pt slightly agitated with encouragement this date) Patient left: in bed;with call bell/phone within reach Nurse Communication: Mobility status PT Visit Diagnosis: Other abnormalities of gait and mobility (R26.89)     Time: 5670-1410 PT Time Calculation (min) (ACUTE ONLY): 14 min  Charges:  $Therapeutic Activity: 8-22 mins                     Jaleena Viviani P., PTA Acute Rehabilitation Services Pager: [phone] Office: [place] 11/12/2020, 6:04 PM

## 2020-11-12 NOTE — Progress Notes (Signed)
OT Cancellation Note  Patient Details Name: Matthew [name] MRN: [id number] DOB: 1965/10/26   Cancelled Treatment:    Reason Eval/Treat Not Completed: Patient declined, no reason specified;Other (comment) Pt reports having just had a "panic attack" not wanting this COTA to ask him any questions or work with him. Will check back as time allows for OT session.   Harley Alto., COTA/L Acute Rehabilitation Services [phone]   Precious Haws 11/12/2020, 12:51 PM

## 2020-11-13 NOTE — Progress Notes (Signed)
Nutrition Follow-up  DOCUMENTATION CODES:   Not applicable  INTERVENTION:   -D/c Ensure Enlive po TID, each supplement provides 350 kcal and 20 grams of protein -Continue MVI with minerals daily -D/c Magic cup TID with meals, each supplement provides 290 kcal and 9 grams of protein -D/c Hormel Shake  NUTRITION DIAGNOSIS:   Increased nutrient needs related to acute illness (COVID-19 pneumonia) as evidenced by estimated needs.  Ongoing  GOAL:   Patient will meet greater than or equal to 90% of their needs  Progressing   MONITOR:   PO intake,Supplement acceptance,Labs,Weight trends,I & O's  REASON FOR ASSESSMENT:   Malnutrition Screening Tool    ASSESSMENT:   55 y.o. male with PMHx of  CAD s/p PCI, tobacco use, HLD, GERD-who presented with shortness of breath-found to have acute hypoxic respiratory failure requiring 100% O2 via NRB due to COVID-19 pneumonia.  Reviewed I/O's: +140 ml x 24 hours and -556 ml since 10/30/20  UOP: 100 ml x 24 hours  Pt no on nasal cannula.  Case discussed with RN, who reports pt has been drowsy due to receiving Klonopin earlier this morning. She confirms pt has not been eating much hospital food. Noted meal completion 0-10%.   Observed breakfast tray, which was untouched. Pt with multiple snacks in room such as fruit, potato chips, and [employer].   Spoke with pt at bedside, who was drowsy at time of visit. He reports feeling better today. He shares that he has a good appetite, but the hospital food is "inedible". He shares that his wife visits him daily and brings him food to eat. Wife provides foods such as snacks, Brendolyn Patty, Janine Limbo, [place], and pizza. Meal tray removed by staff at time of visit; offered to keep certain items (milk, coffee, ice cream, or applesauce), however, pt politely declined.   Pt shares his UBW is around 220#. He shares that he has lost about 50# in the past month due to "quitting smoking and [facility]".    Discussed with pt importance of good meal intake to promote healing.   Labs reviewed.   NUTRITION - FOCUSED PHYSICAL EXAM:  Flowsheet Row Most Recent Value  Orbital Region No depletion  Upper Arm Region Mild depletion  Thoracic and Lumbar Region No depletion  Buccal Region No depletion  Temple Region No depletion  Clavicle Bone Region No depletion  Clavicle and Acromion Bone Region No depletion  Scapular Bone Region No depletion  Dorsal Hand No depletion  Patellar Region Mild depletion  Anterior Thigh Region Mild depletion  Posterior Calf Region Mild depletion  Edema (RD Assessment) None  Hair Reviewed  Eyes Reviewed  Mouth Reviewed  Skin Reviewed  Nails Reviewed       Diet Order:   Diet Order            Diet Heart Room service appropriate? No; Fluid consistency: Thin; Fluid restriction: 2000 mL Fluid  Diet effective now                 EDUCATION NEEDS:   No education needs have been identified at this time  Skin:  Skin Assessment: Reviewed RN Assessment  Last BM:  11/10/20  Height:   Ht Readings from Last 1 Encounters:  09/27/20 5\' 11"  (1.803 m)    Weight:   Wt Readings from Last 1 Encounters:  11/13/20 90.7 kg   BMI:  Body mass index is 27.89 kg/m.  Estimated Nutritional Needs:   Kcal:  4782-9562  Protein:  110-120g  Fluid:  2L/day    Loistine Chance, RD, LDN, [place] Registered Dietitian II Certified Diabetes Care and Education Specialist Please refer to [facility] for RD and/or RD on-call/weekend/after hours pager

## 2020-11-13 NOTE — Plan of Care (Signed)
  Problem: Clinical Measurements: Goal: Will remain free from infection Outcome: Progressing   Problem: Safety: Goal: Ability to remain free from injury will improve Outcome: Progressing   Problem: Clinical Measurements: Goal: Respiratory complications will improve Outcome: Not Progressing

## 2020-11-13 NOTE — Progress Notes (Signed)
OT Cancellation Note  Patient Details Name: Matthew [name] MRN: [id number] DOB: [birth year]   Cancelled Treatment:    Reason Eval/Treat Not Completed: Patient declined, has a myriad of excuses why he can't do therapy.  Patient refuses again.  Patient blaming this therapist for telling him to do things.  Telling therapist he won't work with this OT. Savina Olshefski D Abbrielle Batts 11/13/2020, 4:18 PM

## 2020-11-13 NOTE — Progress Notes (Addendum)
1205:Pt refusing to order lunch and dinner as well as scheduled ensures. Pt states " Does not like hospital food and wife will bring meals." Wife brought pt [employer]. Pt ate 100% of meal and continues to take sips of sweet tea.    On my way to round on patient to ensure he received meal from wife, saw PT in the hallway to discuss what activities the patient was willing to do since he rescheduled therapy X2 today for later time. PT communicated that the patient refused to work with therapy for the [place]. I went into the room to encourage the patient to work with therapy so staff can assess what he may need assistance with for discharge. Patient began yelling and cursing at staff. Observed patient's O2 sats dropping into the low 80's and agitation continuing to grow. I asked the patient to try to catch his breath and allow me to educate him on the importance of therapies, especially since he is NOT wanting to go to [facility]. Pt acknowledged understanding but continued to refuse therapy. Please see PT note. Will continue to monitor patient.

## 2020-11-13 NOTE — Progress Notes (Signed)
PROGRESS NOTE    Matthew [name]  DDU:[id number] DOB: [birth year] DOA: 09/26/2020 PCP: Patient, No Pcp Per   Brief Narrative:  HPI on 09/26/2020 by Dr. Neva Seat Matthew [name] is a 55 y.o. male with medical history significant of CAD status post stent x2, tobacco use, hyperlipidemia, GERD who presents with acutely worsening shortness of breath.  Patient has had gradual worsening SOB since 12/9 and GI illness from around 12/10-12/14. On 12/13 he was seen at urgent care for diarrhea nausea and vomiting.  He was little bit tachycardic there but otherwise stable.  He was treated with fluids and supportive care, he declined a Covid test at that time. As above SOB has been gradually worsening, but became significantly worse today. He states that in the 3 hours prior to calling for transport he became significantly more short of breath.  Shortness of breath became worse while he was sitting watching TV.  When EMS arrived he was saturating 78% on room air with significant rales.  He was started on 15 L nonrebreather with improvement to 90%.  He is unvaccinated for Covid. Also reports some fever over the weekend. He denies fever, chest pain, abdominal pain, constipation.  Interim history Admitted with respiratory failure secondary to COVID-19 pneumonia and ARDS.  Continues to need high amounts of oxygen. Assessment & Plan   Acute hypoxic respiratory failure secondary to COVID-19 viral pneumonia/ARDS -Patient was requiring high amounts of heated high flow as well as nonrebreather however has been weaned down to approximately 8 L as of this morning. -Patient was treated with steroids along with remdesivir and baricitinib -When placed on 4 L of supplemental oxygen, oxygen saturations dropped to 84% -Continue to use incentive spirometry and encourage patient to get out of bed -PT recommended home health, supervision, wheelchair with wheelchair cushion -Of note, patient did have hemoptysis, PCCM  was consulted and he will need to follow-up with them as an outpatient for the fibrosis clinic  Elevated D-dimer -CTA unremarkable x2 along with lower extremity Doppler  -Continue Lovenox for DVT prophylaxis  Acute on chronic diastolic heart failure -Echocardiogram shows an EF 6065%, mild left ventricular hypertrophy.  No regional wall motion abnormalities. -Currently compensated -patient was diuresed  -Monitor intake and output, daily weights  Hypotension -Continue midodrine -Currently asymptomatic  Transaminitis -Resolved, Likely secondary to COVID-19  -Continue to monitor  Bipolar disorder/Depression/Anxiety -Continue lexapro  CAD -Currently no complaints of chest pain -Patient was on any antiplatelet therapy at home  GERD -Continue PPI  DVT Prophylaxis Lovenox  Code Status: Full  Family Communication: None at bedside  Disposition Plan:  Status is: Inpatient  Remains inpatient appropriate because:Hypoxia requiring high amounts of oxygen.   Dispo: The patient is from: Home              Anticipated d/c is to: Home              Anticipated d/c date is: 2 days              Patient currently is not medically stable to d/c.   Difficult to place patient No  Consultants PCCM  Procedures  Echocardiogram Lower extremity Doppler  Antibiotics   Anti-infectives (From admission, onward)   Start     Dose/Rate Route Frequency Ordered Stop   09/27/20 1000  remdesivir 100 mg in sodium chloride 0.9 % 100 mL IVPB       "Followed by" Linked Group Details   100 mg 200 mL/hr over 30 Minutes  Intravenous Daily 09/26/20 2208 09/30/20 1900   09/26/20 2215  remdesivir 200 mg in sodium chloride 0.9% 250 mL IVPB       "Followed by" Linked Group Details   200 mg 580 mL/hr over 30 Minutes Intravenous Once 09/26/20 2208 09/27/20 0146      Subjective:   Matthew [name] seen and examined today.  No complaints this morning, just feels sleepy.  Denies chest pain or shortness of  breath while being still.  Feels short of breath with movement.  Objective:   Vitals:   11/12/20 1134 11/12/20 1612 11/12/20 2055 11/13/20 0600  BP: 107/76 97/71 104/65   Pulse: 80 87 73   Resp: (!) 21 20 (!) 21   Temp: 97.6 F (36.4 C) 98 F (36.7 C) 98.2 F (36.8 C)   TempSrc: Oral Oral Oral   SpO2: 95% 98% 96%   Weight:    90.7 kg  Height:        Intake/Output Summary (Last 24 hours) at 11/13/2020 1244 Last data filed at 11/12/2020 2000 Gross per 24 hour  Intake 240 ml  Output 100 ml  Net 140 ml   Filed Weights   11/11/20 0500 11/12/20 0536 11/13/20 0600  Weight: 93 kg 92.5 kg 90.7 kg    Exam  General: Well developed, chronically ill-appearing, NAD  HEENT: NCAT, mucous membranes moist.   Cardiovascular: S1 S2 auscultated, RRR  Respiratory: Diminished breath sounds with few crackles at the bases  Abdomen:  soundsSoft, nontender, nondistended, + bowel sounds  Extremities: warm dry without cyanosis clubbing or edema  Neuro: AAOx3, nonfocal  Psych: appropriate mood and affect   Data Reviewed: I have personally reviewed following labs and imaging studies  CBC: Recent Labs  Lab 11/07/20 0155 11/08/20 0545 11/09/20 0452  WBC 13.0* 11.8* 14.0*  NEUTROABS 10.0* 8.8* 10.7*  HGB 9.8* 10.2* 9.8*  HCT 31.2* 32.6* 31.0*  MCV 96.6 96.4 96.0  PLT 207 211 846   Basic Metabolic Panel: Recent Labs  Lab 11/07/20 0155 11/08/20 0545 11/09/20 0452  NA 137 138 136  K 3.5 4.4 4.1  CL 99 99 98  CO2 27 29 28   GLUCOSE 108* 117* 118*  BUN 10 11 12   CREATININE 1.00 1.00 0.91  CALCIUM 8.3* 8.6* 8.6*  MG 2.0 2.0 2.0   GFR: Estimated Creatinine Clearance: 104.5 mL/min (by C-G formula based on SCr of 0.91 mg/dL). Liver Function Tests: Recent Labs  Lab 11/07/20 0155 11/08/20 0545 11/09/20 0452  AST 23 20 20   ALT 26 26 23   ALKPHOS 95 104 102  BILITOT 1.0 0.6 0.8  PROT 7.7 7.7 7.9  ALBUMIN 2.3* 2.3* 2.3*   No results for input(s): LIPASE, AMYLASE in the last  168 hours. No results for input(s): AMMONIA in the last 168 hours. Coagulation Profile: No results for input(s): INR, PROTIME in the last 168 hours. Cardiac Enzymes: No results for input(s): CKTOTAL, CKMB, CKMBINDEX, TROPONINI in the last 168 hours. BNP (last 3 results) No results for input(s): PROBNP in the last 8760 hours. HbA1C: No results for input(s): HGBA1C in the last 72 hours. CBG: No results for input(s): GLUCAP in the last 168 hours. Lipid Profile: No results for input(s): CHOL, HDL, LDLCALC, TRIG, CHOLHDL, LDLDIRECT in the last 72 hours. Thyroid Function Tests: No results for input(s): TSH, T4TOTAL, FREET4, T3FREE, THYROIDAB in the last 72 hours. Anemia Panel: No results for input(s): VITAMINB12, FOLATE, FERRITIN, TIBC, IRON, RETICCTPCT in the last 72 hours. Urine analysis: No results found for: COLORURINE,  APPEARANCEUR, LABSPEC, PHURINE, GLUCOSEU, HGBUR, BILIRUBINUR, KETONESUR, PROTEINUR, UROBILINOGEN, NITRITE, LEUKOCYTESUR Sepsis Labs: @LABRCNTIP (procalcitonin:4,lacticidven:4)  )No results found for this or any previous visit (from the past 240 hour(s)).    Radiology Studies: No results found.   Scheduled Meds: . enoxaparin (LOVENOX) injection  40 mg Subcutaneous QHS  . escitalopram  20 mg Oral Daily  . melatonin  3 mg Oral QHS  . midodrine  10 mg Oral BID WC  . multivitamins with iron  1 tablet Oral Daily  . pantoprazole  40 mg Oral Daily   Continuous Infusions:   LOS: 48 days   Time Spent in minutes   45 minutes  Ruqayya Ventress D.O. on 11/13/2020 at 12:44 PM  Between 7am to 7pm - Please see pager noted on [email or web address]  After 7pm go to [email or web address]  And look for the night coverage person covering for me after hours  [facility] Office  [phone]

## 2020-11-14 NOTE — Progress Notes (Addendum)
Physical Therapy Treatment Patient Details Name: Matthew [name] MRN: [id number] DOB: 1966-08-30 Today's Date: 11/14/2020    History of Present Illness Pt is a 55 y.o. male admitted 09/26/20 with SOB and GI distress (had declined COVID test when seen in urgent care). Pt now testing (+) COVID-19. Workup for acute hypoxic respiratory failure due to acute COVID-19 viral PNA. Course complicated by hemoptysis, worsening hypoxemis requiring HHFNC; pt unable to lay flat for chest CTA. Negative DVT on 12/31. Repeat CXR 1/10 with diffuse bilateral airspace disease. PMH includes CAD, CHF, tobacco use (quit 11/2019); pt unvaccinated. Off precautions    PT Comments    Pt requiring max encouragement and education to initiate participation in session. Pt with increased anxiety requiring education/reminder of techniques to calm and control breathing. Pt able to recall techniques but has difficulty performing. Pt requiring increased rest supine following the beginning of a panic attack while sitting EOB. Pt then able to perform transfer to recliner. Pt receptive to techniques to improve anxiety and breathing. Pt agreeable to d/c with use of w/c and the benefits of being home to continue to progress with functional mobility skills. Pt continues to be limited by strength, coordination, endurance/activity tolerance and gait. Pt will benefit from skilled PT to address deficits to maximize independence with functional mobility prior to discharge.   SATURATION QUALIFICATIONS: (This note is used to comply with regulatory documentation for home oxygen)  At rest on 6L O2 pt remains 92-100%  Pt desaturates with transition to sitting EOB on 8 L O2 to 86% but returned to 92% following rest  Pt on 8 L O2 with mobility in room 84%, when returned to sitting increased to 91%  Please briefly explain why patient needs home oxygen: Pt is unable to maintain O2 >88% with movement on 8L O2, pt will need oxygen at home to maintain  appropriate SpO2  Pt desaturates on room air at rest and requires supplemental oxygen to maintain a safe saturation level.  See General comments   Follow Up Recommendations  Home health PT;Supervision - Intermittent     Equipment Recommendations  Wheelchair (measurements PT);Wheelchair cushion (measurements PT);Other (comment) (ramp)    Recommendations for Other Services Other (comment) (Palliative Care for a holistic approach to symptom management)     Precautions / Restrictions      Mobility  Bed Mobility Overal bed mobility: Modified Independent             General bed mobility comments: performed rolling and supine<>sit multiple time mod I, pt requiring increased time to prepare for moblity  Transfers Overall transfer level: Needs assistance Equipment used: 1 person hand held assist Transfers: Sit to/from [facility] Sit to Stand: Min guard Stand pivot transfers: Min guard          Ambulation/Gait                 Stairs             Wheelchair Mobility    Modified Rankin (Stroke Patients Only)       Balance   Sitting-balance support: Feet supported Sitting balance-Leahy Scale: Good                                      Cognition  Exercises      General Comments General comments (skin integrity, edema, etc.): Pt on 6 L upon arrival with SpO2 92-100%. Pt requesting O2 to increase to 8L during activity SpO2 100% supine. Pt transitioned to sitting EOB wtih O2 varying between 86-92% wtih deep breathing. pt became anxious with short breaths and returned supine in bed. Following rest pt performed supine>sit>stand pivot to recliner on 8L O2 with a drop in O2 to 84% but when seated returned to 91%. O2 retruend to 6L following activity, RN notified      Pertinent Vitals/Pain      Home Living                      Prior Function             PT Goals (current goals can now be found in the care plan section) Acute Rehab PT Goals Patient Stated Goal: get past breathing issues PT Goal Formulation: With patient Time For Goal Achievement: 11/24/20 Potential to Achieve Goals: Fair Progress towards PT goals: Progressing toward goals    Frequency    Min 3X/week      PT Plan Current plan remains appropriate    Co-evaluation              AM-PAC PT "6 Clicks" Mobility   Outcome Measure  Help needed turning from your back to your side while in a flat bed without using bedrails?: None   Help needed moving to and from a bed to a chair (including a wheelchair)?: A Little Help needed standing up from a chair using your arms (e.g., wheelchair or bedside chair)?: A Little Help needed to walk in hospital room?: A Little Help needed climbing 3-5 steps with a railing? : A Lot 6 Click Score: 15    End of Session Equipment Utilized During Treatment: Oxygen Activity Tolerance: Patient tolerated treatment well;Patient limited by fatigue;Other (comment) (anxiety/fatigue limiting) Patient left: in chair;with call bell/phone within reach Nurse Communication: Mobility status PT Visit Diagnosis: Other abnormalities of gait and mobility (R26.89)     Time: 6237-6283 PT Time Calculation (min) (ACUTE ONLY): 35 min  Charges:  $Therapeutic Activity: 23-37 mins                     Lyanne Co, DPT Acute Rehabilitation Services [phone]   [place] 11/14/2020, 12:13 PM

## 2020-11-14 NOTE — Progress Notes (Addendum)
Pt maintaining 88% on RA at rest. Will continue to monitor

## 2020-11-14 NOTE — Progress Notes (Signed)
Occupational Therapy Treatment Patient Details Name: Matthew [name] MRN: [id number] DOB: [birth year] Today's Date: 11/14/2020    History of present illness Pt is a 55 y.o. male admitted 09/26/20 with SOB and GI distress (had declined COVID test when seen in urgent care). Pt now testing (+) COVID-19. Workup for acute hypoxic respiratory failure due to acute COVID-19 viral PNA. Course complicated by hemoptysis, worsening hypoxemis requiring HHFNC; pt unable to lay flat for chest CTA. Negative DVT on 12/31. Repeat CXR 1/10 with diffuse bilateral airspace disease. PMH includes CAD, CHF, tobacco use (quit 11/2019); pt unvaccinated. Off precautions   OT comments  Pt making progress towards OT goals this session. Pt was able to perform LB dressing at min guard, multiple sit<>stand with seated resting breaks inbetween, and short in room mobility with RW at min guard. Pt benefitted from continued education on energy conservation and anxiety compensatory strategies. Today he did well with timed rest breaks "in 5 min we are going to do ____" and co-created short, in-session goals for activity.   Pt initially on 5L sitting in recliner, with 1st sit<>stand Pt desaturated to 82% took approx 2 min to return to >90% on 5L. For 2-4th sit<>stand Pt on 7L O2 and SpO2 remained >90% throughout. For last SPT to bed Pt on 6L O2 (Pt thought that he was on 8) and SpO2 >90. returned to 5L while supine in bed and Pt >90%.  OT will continue to follow acutely and updated dc recommendations for HHOT.    Follow Up Recommendations  Home health OT;Supervision - Intermittent    Equipment Recommendations  Tub/shower bench    Recommendations for Other Services      Precautions / Restrictions Precautions Precautions: Fall Precaution Comments: anxiety, monitor sats Restrictions Weight Bearing Restrictions: No       Mobility Bed Mobility Overal bed mobility: Modified Independent         Sit to supine: Modified  independent (Device/Increase time)      Transfers Overall transfer level: Needs assistance Equipment used: Rolling walker (2 wheeled);1 person hand held assist Transfers: Sit to/from [facility] Sit to Stand: Min guard Stand pivot transfers: Min guard       General transfer comment: min guard A for balance with HHA, min guard with RW - good hand placement    Balance Overall balance assessment: Needs assistance Sitting-balance support: Feet supported Sitting balance-Leahy Scale: Good     Standing balance support: Bilateral upper extremity supported;Single extremity supported Standing balance-Leahy Scale: Fair Standing balance comment: anxious and benefits from at least one UE support in standing                           ADL either performed or assessed with clinical judgement   ADL Overall ADL's : Needs assistance/impaired         Upper Body Bathing: Min guard;Sitting Upper Body Bathing Details (indicate cue type and reason): educated on energy conservation techniques for bathing specificically         Lower Body Dressing: Min guard;Sitting/lateral leans Lower Body Dressing Details (indicate cue type and reason): change socks Toilet Transfer: Min guard;RW;BSC             General ADL Comments: continues to require significant rest breaks in between activity, very little physical assist - and with todays session improved control over anxiety. Did well with timed goals. (i.e. in 5 min we are going to do this...)  Vision       Perception     Praxis      Cognition Arousal/Alertness: Awake/alert Behavior During Therapy: WFL for tasks assessed/performed;Anxious Overall Cognitive Status: Within Functional Limits for tasks assessed                                          Exercises Other Exercises Other Exercises: reviewed relaxation strategies to reduce feelings of anxiety   Shoulder Instructions        General Comments Pt initially on 5L sitting in recliner, with 1st sit<>stand Pt desaturated to 82% took approx 2 min to return to >90% on 5L. For 2-4th sit<>stand Pt on 7L O2 and SpO2 remained >90% throughout. For last SPT to bed Pt on 6L O2 (Pt thought that he was on 8) and SpO2 >90. returned to 5L while supine in bed and Pt >90%.    Pertinent Vitals/ Pain       Pain Assessment: No/denies pain Pain Intervention(s): Monitored during session  Home Living                                          Prior Functioning/Environment              Frequency  Min 2X/week        Progress Toward Goals  OT Goals(current goals can now be found in the care plan section)  Progress towards OT goals: Progressing toward goals  Acute Rehab OT Goals Patient Stated Goal: get past breathing issues OT Goal Formulation: With patient Time For Goal Achievement: 11/20/20 Potential to Achieve Goals: Good  Plan Discharge plan needs to be updated;Frequency needs to be updated    Co-evaluation                 AM-PAC OT "6 Clicks" Daily Activity     Outcome Measure   Help from another person eating meals?: A Little Help from another person taking care of personal grooming?: A Little Help from another person toileting, which includes using toliet, bedpan, or urinal?: A Little Help from another person bathing (including washing, rinsing, drying)?: A Little Help from another person to put on and taking off regular upper body clothing?: A Little Help from another person to put on and taking off regular lower body clothing?: A Little 6 Click Score: 18    End of Session Equipment Utilized During Treatment: Oxygen (5-7L)  OT Visit Diagnosis: Unsteadiness on feet (R26.81);Other abnormalities of gait and mobility (R26.89);Muscle weakness (generalized) (M62.81);Dizziness and giddiness (R42)   Activity Tolerance Patient tolerated treatment well   Patient Left in bed;with call  bell/phone within reach   Nurse Communication Mobility status        Time: 2725-3664 OT Time Calculation (min): 56 min  Charges: OT General Charges $OT Visit: 1 Visit OT Treatments $Self Care/Home Management : 8-22 mins $Therapeutic Activity: 38-52 mins  Jesse Sans OTR/L Acute Rehabilitation Services Pager: [phone] Office: [place] 11/14/2020, 4:21 PM

## 2020-11-14 NOTE — Progress Notes (Signed)
PROGRESS NOTE    Matthew [name]  [id number] DOB: [birth year] DOA: 09/26/2020 PCP: Patient, No Pcp Per   Brief Narrative:  HPI on 09/26/2020 by Dr. Neva Seat Matthew [name] is a 55 y.o. male with medical history significant of CAD status post stent x2, tobacco use, hyperlipidemia, GERD who presents with acutely worsening shortness of breath.  Patient has had gradual worsening SOB since 12/9 and GI illness from around 12/10-12/14. On 12/13 he was seen at urgent care for diarrhea nausea and vomiting.  He was little bit tachycardic there but otherwise stable.  He was treated with fluids and supportive care, he declined a Covid test at that time. As above SOB has been gradually worsening, but became significantly worse today. He states that in the 3 hours prior to calling for transport he became significantly more short of breath.  Shortness of breath became worse while he was sitting watching TV.  When EMS arrived he was saturating 78% on room air with significant rales.  He was started on 15 L nonrebreather with improvement to 90%.  He is unvaccinated for Covid. Also reports some fever over the weekend. He denies fever, chest pain, abdominal pain, constipation.  Interim history Admitted with respiratory failure secondary to COVID-19 pneumonia and ARDS.  Continues to need high amounts of oxygen. Assessment & Plan   Acute hypoxic respiratory failure secondary to COVID-19 viral pneumonia/ARDS -Patient was requiring high amounts of heated high flow as well as nonrebreather however has been weaned down to approximately 7 L as of this morning. -Patient was treated with steroids along with remdesivir and baricitinib -When placed on 4 L of supplemental oxygen, oxygen saturations dropped to 84% -Continue to use incentive spirometry and encourage patient to get out of bed -PT recommended home health, supervision, wheelchair with wheelchair cushion -Of note, patient did have hemoptysis, PCCM  was consulted and he will need to follow-up with them as an outpatient for the fibrosis clinic -Patient was able to work with physical therapy today, however had started off at 6 L of oxygen with a oxygen saturation of 92% and requested to be increased to 8 L during activity.  Oxygen saturations varied between 86 to 92% with deep breathing.  Patient was noted to have a drop in his oxygen saturation to 84% however recovered to 91% with sitting.  Elevated D-dimer -CTA unremarkable x2 along with lower extremity Doppler  -Continue Lovenox for DVT prophylaxis  Acute on chronic diastolic heart failure -Echocardiogram shows an EF 60-65%, mild left ventricular hypertrophy.  No regional wall motion abnormalities. -Currently compensated -patient was diuresed  -Monitor intake and output, daily weights  Hypotension -Continue midodrine -Currently asymptomatic  Transaminitis -Resolved, Likely secondary to COVID-19  -Continue to monitor  Bipolar disorder/Depression/Anxiety -Continue lexapro  CAD -Currently no complaints of chest pain -Patient was on any antiplatelet therapy at home  GERD -Continue PPI  DVT Prophylaxis Lovenox  Code Status: Full  Family Communication: None at bedside. Wife via phone.   Disposition Plan:  Status is: Inpatient  Remains inpatient appropriate because:Hypoxia requiring high amounts of oxygen.    Dispo: The patient is from: Home              Anticipated d/c is to: Home              Anticipated d/c date is: 1 day              Patient currently is not medically stable to d/c.   Difficult  to place patient No  Consultants PCCM  Procedures  Echocardiogram Lower extremity Doppler  Antibiotics   Anti-infectives (From admission, onward)   Start     Dose/Rate Route Frequency Ordered Stop   09/27/20 1000  remdesivir 100 mg in sodium chloride 0.9 % 100 mL IVPB       "Followed by" Linked Group Details   100 mg 200 mL/hr over 30 Minutes Intravenous Daily  09/26/20 2208 09/30/20 1900   09/26/20 2215  remdesivir 200 mg in sodium chloride 0.9% 250 mL IVPB       "Followed by" Linked Group Details   200 mg 580 mL/hr over 30 Minutes Intravenous Once 09/26/20 2208 09/27/20 0146      Subjective:   Matthew [name] seen and examined today.  Patient with no complaints this morning.  Does feel sleepy and tired.  Denies shortness of breath at this time although is not moving-feels more short of breath with minimal movement.    Objective:   Vitals:   11/13/20 2105 11/14/20 0047 11/14/20 0458 11/14/20 1145  BP: 92/66 105/75 98/61 98/72   Pulse: 69 64 72 77  Resp: 20 20 18 19   Temp: 97.7 F (36.5 C) 98.7 F (37.1 C) 97.7 F (36.5 C) 97.8 F (36.6 C)  TempSrc: Oral  Oral Oral  SpO2: 97% 97% 100% 95%  Weight:   85.3 kg   Height:        Intake/Output Summary (Last 24 hours) at 11/14/2020 1409 Last data filed at 11/14/2020 1240 Gross per 24 hour  Intake 240 ml  Output 175 ml  Net 65 ml   Filed Weights   11/12/20 0536 11/13/20 0600 11/14/20 0458  Weight: 92.5 kg 90.7 kg 85.3 kg   Exam  General: Well developed, chronically ill-appearing, NAD  HEENT: NCAT, mucous membranes moist.    Cardiovascular: RRR, S1-S2 auscultated  Respiratory: Diminished breath sounds  Abdomen: Soft, nontender, nondistended, + bowel sounds  Extremities: warm dry without cyanosis clubbing or edema  Neuro: AAOx3, nonfocal  Psych: appropriate mood and affect  Data Reviewed: I have personally reviewed following labs and imaging studies  CBC: Recent Labs  Lab 11/08/20 0545 11/09/20 0452  WBC 11.8* 14.0*  NEUTROABS 8.8* 10.7*  HGB 10.2* 9.8*  HCT 32.6* 31.0*  MCV 96.4 96.0  PLT 211 382   Basic Metabolic Panel: Recent Labs  Lab 11/08/20 0545 11/09/20 0452  NA 138 136  K 4.4 4.1  CL 99 98  CO2 29 28  GLUCOSE 117* 118*  BUN 11 12  CREATININE 1.00 0.91  CALCIUM 8.6* 8.6*  MG 2.0 2.0   GFR: Estimated Creatinine Clearance: 96.5 mL/min (by C-G  formula based on SCr of 0.91 mg/dL). Liver Function Tests: Recent Labs  Lab 11/08/20 0545 11/09/20 0452  AST 20 20  ALT 26 23  ALKPHOS 104 102  BILITOT 0.6 0.8  PROT 7.7 7.9  ALBUMIN 2.3* 2.3*   No results for input(s): LIPASE, AMYLASE in the last 168 hours. No results for input(s): AMMONIA in the last 168 hours. Coagulation Profile: No results for input(s): INR, PROTIME in the last 168 hours. Cardiac Enzymes: No results for input(s): CKTOTAL, CKMB, CKMBINDEX, TROPONINI in the last 168 hours. BNP (last 3 results) No results for input(s): PROBNP in the last 8760 hours. HbA1C: No results for input(s): HGBA1C in the last 72 hours. CBG: No results for input(s): GLUCAP in the last 168 hours. Lipid Profile: No results for input(s): CHOL, HDL, LDLCALC, TRIG, CHOLHDL, LDLDIRECT in the last  72 hours. Thyroid Function Tests: No results for input(s): TSH, T4TOTAL, FREET4, T3FREE, THYROIDAB in the last 72 hours. Anemia Panel: No results for input(s): VITAMINB12, FOLATE, FERRITIN, TIBC, IRON, RETICCTPCT in the last 72 hours. Urine analysis: No results found for: COLORURINE, APPEARANCEUR, LABSPEC, PHURINE, GLUCOSEU, HGBUR, BILIRUBINUR, KETONESUR, PROTEINUR, UROBILINOGEN, NITRITE, LEUKOCYTESUR Sepsis Labs: @LABRCNTIP (procalcitonin:4,lacticidven:4)  )No results found for this or any previous visit (from the past 240 hour(s)).    Radiology Studies: No results found.   Scheduled Meds: . enoxaparin (LOVENOX) injection  40 mg Subcutaneous QHS  . escitalopram  20 mg Oral Daily  . melatonin  3 mg Oral QHS  . midodrine  10 mg Oral BID WC  . multivitamins with iron  1 tablet Oral Daily  . pantoprazole  40 mg Oral Daily   Continuous Infusions:   LOS: 49 days   Time Spent in minutes   45 minutes (greater than 50% of time spent with patient face to face, as well as reviewing records, calling family, and formulating a plan)   Cristal Ford D.O. on 11/14/2020 at 2:09 PM  Between 7am to  7pm - Please see pager noted on [email or web address]  After 7pm go to [email or web address]  And look for the night coverage person covering for me after hours  [facility] Office  [phone]

## 2020-11-15 ENCOUNTER — Other Ambulatory Visit: Payer: Self-pay | Admitting: Internal Medicine

## 2020-11-15 ENCOUNTER — Other Ambulatory Visit (HOSPITAL_COMMUNITY): Payer: Self-pay | Admitting: Internal Medicine

## 2020-11-15 MED ORDER — TAB-A-VITE/IRON PO TABS: 1.0000 | ORAL_TABLET | Freq: Every day | ORAL | 1 refills | Status: DC

## 2020-11-15 MED ORDER — BENZONATATE 200 MG PO CAPS: 200.0000 mg | ORAL_CAPSULE | Freq: Three times a day (TID) | ORAL | 0 refills | Status: DC | PRN

## 2020-11-15 MED ORDER — ALBUTEROL SULFATE HFA 108 (90 BASE) MCG/ACT IN AERS
2.0000 | INHALATION_SPRAY | Freq: Four times a day (QID) | RESPIRATORY_TRACT | 2 refills | Status: DC | PRN
Start: 2020-11-15 — End: 2020-11-15

## 2020-11-15 MED ORDER — CLONAZEPAM 0.5 MG PO TABS: 0.5000 mg | ORAL_TABLET | Freq: Every day | ORAL | 1 refills | Status: DC | PRN

## 2020-11-15 MED ORDER — MIDODRINE HCL 10 MG PO TABS: 10.0000 mg | ORAL_TABLET | Freq: Two times a day (BID) | ORAL | 1 refills | Status: DC

## 2020-11-15 MED ORDER — PANTOPRAZOLE SODIUM 40 MG PO TBEC: 40.0000 mg | DELAYED_RELEASE_TABLET | Freq: Every day | ORAL | 1 refills | Status: DC

## 2020-11-15 MED ORDER — ESCITALOPRAM OXALATE 20 MG PO TABS: 20.0000 mg | ORAL_TABLET | Freq: Every day | ORAL | 1 refills | Status: DC

## 2020-11-15 MED FILL — PANTOPRAZOLE SOD DR 40 MG T: 40 | 30 days supply | Qty: 30 | Fill #0

## 2020-11-15 MED FILL — clonazePAM 0.5 MG TABS: 0.5 | 30 days supply | Qty: 30 | Fill #0

## 2020-11-15 MED FILL — BENZONATATE 200 MG CAPS: 200 | 6 days supply | Qty: 20 | Fill #0

## 2020-11-15 MED FILL — CERTAVITE/ANTIOXIDANTS TABS: 30 days supply | Qty: 30 | Fill #0

## 2020-11-15 MED FILL — MIDODRINE HCL 10 MG TABLET: 10 | 30 days supply | Qty: 60 | Fill #0

## 2020-11-15 MED FILL — ALBUTEROL SULFATE HFA 108 (: 108 (90 BAS | 25 days supply | Qty: 18 | Fill #0

## 2020-11-15 MED FILL — ESCITALOPRAM 20 MG TABLET: 20 | 30 days supply | Qty: 30 | Fill #0

## 2020-11-15 NOTE — TOC Transition Note (Signed)
Transition of Care [facility]) - CM/SW Discharge Note   Patient Details  Name: [name] MRN: [id number] Date of Birth: 01/08/1966  [facility]) CM/SW Contact:  Zenon Mayo, RN Phone Number: 11/15/2020, 2:05 PM   Clinical Narrative:    Patient is for dc tomorrow home with wife, he does not have insurance, he will be getting home oxygen thru [employer] under the COVID protocol.  The oxygen has been delivered to his room and the concentrator will be delivered to his home today.  TOC is filling his medications and they will take them to the Main pharmacy to be picked up by the Staff RN tomorrow before patient is discharged.  Patient has DME at home like w/chair , walker .   Patient will need HHPT thru charity,  NCM made referral to Amy with [facility] for HHPT. Awaiting call back.    Final next level of care: [place] Barriers to Discharge: Continued Medical Work up   Patient Goals and CMS Choice Patient states their goals for this hospitalization and ongoing recovery are:: home , get better      Discharge Placement                       Discharge Plan and Services   Discharge Planning Services: CM Consult Post Acute Care Choice: Durable Medical Equipment            DME Agency: [employer] Date DME Agency Contacted: 11/14/20 Time DME Agency Contacted: 1000 Representative spoke with at DME Agency: Thedore Mins HH Arranged: PT [place] Date [place]: 11/15/20 Time [place]: [place] Representative spoke with at [place]: Amy  Social Determinants of Health ([place]) Interventions     Readmission Risk Interventions No flowsheet data found.

## 2020-11-15 NOTE — Plan of Care (Signed)
  Problem: Clinical Measurements: Goal: Respiratory complications will improve Outcome: Progressing   Problem: Activity: Goal: Risk for activity intolerance will decrease 11/15/2020 2033 by Barton Dubois, RN Outcome: Progressing 11/15/2020 0634 by Barton Dubois, RN Outcome: Progressing   Problem: Coping: Goal: Level of anxiety will decrease Outcome: Progressing

## 2020-11-15 NOTE — Progress Notes (Signed)
    Durable Medical Equipment  (From admission, onward)         Start     Ordered   11/15/20 1246  For home use only DME lightweight manual wheelchair with seat cushion  Once       Comments: Patient suffers from weakness which impairs their ability to perform daily activities like bathing and dressing in the home.  A walker or cane will not resolve  issue with performing activities of daily living. A wheelchair will allow patient to safely perform daily activities. Patient is not able to propel themselves in the home using a standard weight wheelchair due to weakness. Patient can self propel in the lightweight wheelchair. Length of need lifetime. Accessories: elevating leg rests (ELRs), wheel locks, extensions and anti-tippers.   11/15/20 1248   11/10/20 0813  For home use only DME oxygen  Once       Question Answer Comment  Length of Need 6 Months   Mode or (Route) Nasal cannula   Liters per Minute 5   Frequency Continuous (stationary and portable oxygen unit needed)   Oxygen conserving device Yes   Oxygen delivery system Gas      11/10/20 0813

## 2020-11-15 NOTE — Discharge Instructions (Signed)
Acute Respiratory Failure, Adult Acute respiratory failure is a condition that is a medical emergency. It can develop quickly, and it should be treated right away. There are two types of acute respiratory failure:  Type I respiratory failure is when the lungs are not able to get enough oxygen into the blood. This causes the blood oxygen level to drop.  Type II respiratory failure is when carbon dioxide is not passing from the lungs out of the body. This causes carbon dioxide to build up in the blood. A person may have one type of acute respiratory failure or have both types at the same time. What are the causes? Common causes of type I respiratory failure include:  Trauma to the lung, chest, ribs, or tissues around the lung.  Pneumonia.  Lung diseases, such as pulmonary fibrosis or asthma.  Smoke, chemical, or water inhalation.  A blood clot in the lungs (pulmonary embolism).  A blood infection (sepsis).  Heart attack. Common causes of type II respiratory failure include:  Stroke.  A spinal cord injury.  A drug or alcohol overdose.  A blood infection (sepsis).  Cardiac arrest. What increases the risk? This condition is more likely to develop in people who have:  Lung diseases such as asthma or chronic obstructive pulmonary disease (COPD).  A condition that damages or weakens the muscles, nerves, bones, or tissues that are involved in breathing, such as myasthenia gravis or Guillain-Barr syndrome.  A serious infection.  A health problem that blocks the unconscious reflex that is involved in breathing, such as hypothyroidism or sleep apnea. What are the signs or symptoms? Trouble breathing is the main symptom of acute respiratory failure. Symptoms may also include:  Fast breathing.  Restlessness or anxiety.  Breathing loudly (wheezing) and grunting.  Fast or irregular heartbeats (palpitations).  Confusion or changes in behavior.  Feeling tired (fatigue),  sleeping more than normal, or being hard to wake.  Skin, lips, or fingernails that appear blue (cyanosis). How is this diagnosed? This condition may be diagnosed based on:  Your medical history and a physical exam. Your health care provider will listen to your heart and lungs to check for abnormal sounds.  Tests to confirm the diagnosis and determine the cause of respiratory failure. These tests may include: ? Measuring the amount of oxygen in your blood (pulse oximetry). The measurement comes from a small device that is placed on your finger, earlobe, or toe. ? Blood tests to measure blood oxygen and carbon dioxide and to look for signs of infection. ? Tests on a sample of the fluid that surrounds the spinal cord (cerebrospinal fluid) or a sample of fluid that is drawn from the windpipe (trachea) to check for infections. ? Chest X-ray. ? Electrocardiogram (ECG) to look at the heart's electrical activity.   How is this treated? Treatment for this condition usually takes place in a hospital intensive care unit (ICU). Treatment depends on what is causing the condition. It may include one or more of these treatments:  Oxygen may be given through your nose or a face mask.  A device such as a continuous positive airway pressure (CPAP) machine or bi-level positive airway pressure (BPAP) machine may be used to help you breathe. The device gives you oxygen and pressure.  Breathing treatments, fluids, and other medicines may be given.  A ventilator may be used to help you breathe. The machine gives you oxygen and pressure. A tube is put into your mouth and trachea to connect the   ventilator. ? If this treatment is needed longer term, a tracheostomy may be placed. A tracheostomy is a breathing tube put through your neck into your trachea.  In extreme cases, extracorporeal life support (ECLS) may be used. This treatment temporarily takes over the function of the heart and lungs, supplying oxygen and  removing carbon dioxide. ECLS gives the lungs a chance to recover. Follow these instructions at home: Medicines  Take over-the-counter and prescription medicines only as told by your health care provider.  If you were prescribed an antibiotic medicine, take it as told by your health care provider. Do not stop using the antibiotic even if you start to feel better.  If you are taking blood thinners: ? Talk with your health care provider before you take any medicines that contain aspirin or NSAIDs, such as ibuprofen. These medicines increase your risk for dangerous bleeding. ? Take your medicine exactly as told, at the same time every day. ? Avoid activities that could cause injury or bruising, and follow instructions about how to prevent falls. ? Wear a medical alert bracelet or carry a card that lists what medicines you take. General instructions  Return to your normal activities as told by your health care provider. Ask your health care provider what activities are safe for you.  Do not use any products that contain nicotine or tobacco, such as cigarettes, e-cigarettes, and chewing tobacco. If you need help quitting, ask your health care provider.  Do not drink alcohol if: ? Your health care provider tells you not to drink. ? You are pregnant, may be pregnant, or are planning to become pregnant.  Wear compression stockings as told by your health care provider. These stockings help to prevent blood clots and reduce swelling in your legs.  Attend any physical therapy and pulmonary rehabilitation as told by your health care provider.  Keep all follow-up visits as told by your health care provider. This is important. How is this prevented?  If you have an infection or a medical condition that may lead to acute respiratory failure, make sure you get proper treatment. Contact a health care provider if:  You have a fever.  Your symptoms do not improve or they get worse. Get help right  away if:  You are having trouble breathing.  You lose consciousness.  You develop a fast heart rate.  Your fingers, lips, or other areas turn blue.  You are confused. These symptoms may represent a serious problem that is an emergency. Do not wait to see if the symptoms will go away. Get medical help right away. Call your local emergency services (911 in the [place] Do not drive yourself to the hospital. Summary  Acute respiratory failure is a medical emergency. It can develop quickly, and it should be treated right away.  Treatment for this condition usually takes place in a hospital intensive care unit (ICU). Treatment may include oxygen, fluids, and medicines. A device may be used to help you breathe, such as a ventilator.  Take over-the-counter and prescription medicines only as told by your health care provider.  Contact a health care provider if your symptoms do not improve or if they get worse. This information is not intended to replace advice given to you by your health care provider. Make sure you discuss any questions you have with your health care provider. Document Revised: 09/15/2019 Document Reviewed: 09/15/2019 Elsevier Patient Education  2021 [employer]  

## 2020-11-15 NOTE — Progress Notes (Signed)
PROGRESS NOTE    Matthew [name]  JQB:[id number] DOB: 26-Mar-1966 DOA: 09/26/2020 PCP: Patient, No Pcp Per   Brief Narrative:  HPI on 09/26/2020 by Dr. Neva Seat Matthew [name] is a 55 y.o. male with medical history significant of CAD status post stent x2, tobacco use, hyperlipidemia, GERD who presents with acutely worsening shortness of breath.  Patient has had gradual worsening SOB since 12/9 and GI illness from around 12/10-12/14. On 12/13 he was seen at urgent care for diarrhea nausea and vomiting.  He was little bit tachycardic there but otherwise stable.  He was treated with fluids and supportive care, he declined a Covid test at that time. As above SOB has been gradually worsening, but became significantly worse today. He states that in the 3 hours prior to calling for transport he became significantly more short of breath.  Shortness of breath became worse while he was sitting watching TV.  When EMS arrived he was saturating 78% on room air with significant rales.  He was started on 15 L nonrebreather with improvement to 90%.  He is unvaccinated for Covid. Also reports some fever over the weekend. He denies fever, chest pain, abdominal pain, constipation.  Interim history Admitted with respiratory failure secondary to COVID-19 pneumonia and ARDS.  Continues to need high amounts of oxygen. Assessment & Plan   Acute hypoxic respiratory failure secondary to COVID-19 viral pneumonia/ARDS -Patient was requiring high amounts of heated high flow as well as nonrebreather however has been weaned down to approximately 7 L as of this morning. -Patient was treated with steroids along with remdesivir and baricitinib -When placed on 4 L of supplemental oxygen, oxygen saturations dropped to 84% -Continue to use incentive spirometry and encourage patient to get out of bed -PT recommended home health, supervision, wheelchair with wheelchair cushion -Of note, patient did have hemoptysis, PCCM  was consulted and he will need to follow-up with them as an outpatient for the fibrosis clinic -Patient was able to work with physical therapy today, however had started off at 6 L of oxygen with a oxygen saturation of 92% and requested to be increased to 8 L during activity.  Oxygen saturations varied between 86 to 92% with deep breathing.  Patient was noted to have a drop in his oxygen saturation to 84% however recovered to 91% with sitting. -Will discharge patient with oxgyen  Elevated D-dimer -CTA unremarkable x2 along with lower extremity Doppler  -Continue Lovenox for DVT prophylaxis  Acute on chronic diastolic heart failure -Echocardiogram shows an EF 60-65%, mild left ventricular hypertrophy.  No regional wall motion abnormalities. -Currently compensated -patient was diuresed  -Monitor intake and output, daily weights  Hypotension -Continue midodrine -Currently asymptomatic  Transaminitis -Resolved, Likely secondary to COVID-19  -Continue to monitor  Bipolar disorder/Depression/Anxiety -Continue lexapro  CAD -Currently no complaints of chest pain -Patient was on any antiplatelet therapy at home  GERD -Continue PPI  DVT Prophylaxis Lovenox  Code Status: Full  Family Communication: None at bedside. Wife via phone.   Disposition Plan:  Status is: Inpatient  Remains inpatient appropriate because:Hypoxia requiring high amounts of oxygen.    Dispo: The patient is from: Home              Anticipated d/c is to: Home              Anticipated d/c date is: 1 day when oxygen is delivered              Patient currently  is not medically stable to d/c.   Difficult to place patient No  Consultants PCCM  Procedures  Echocardiogram Lower extremity Doppler  Antibiotics   Anti-infectives (From admission, onward)   Start     Dose/Rate Route Frequency Ordered Stop   09/27/20 1000  remdesivir 100 mg in sodium chloride 0.9 % 100 mL IVPB       "Followed by" Linked Group  Details   100 mg 200 mL/hr over 30 Minutes Intravenous Daily 09/26/20 2208 09/30/20 1900   09/26/20 2215  remdesivir 200 mg in sodium chloride 0.9% 250 mL IVPB       "Followed by" Linked Group Details   200 mg 580 mL/hr over 30 Minutes Intravenous Once 09/26/20 2208 09/27/20 0146      Subjective:   Matthew [name] seen and examined today.  Patient with no complaints this morning.  Does have shortness of breath with minimal movement.  Denies chest pain.  Objective:   Vitals:   11/14/20 1145 11/14/20 2018 11/15/20 0452 11/15/20 1124  BP: 98/72 94/71 (!) 88/61 107/71  Pulse: 77 74 83 64  Resp: 19 16 18 20   Temp: 97.8 F (36.6 C) (!) 97.5 F (36.4 C) 98.2 F (36.8 C) 97.6 F (36.4 C)  TempSrc: Oral Oral Oral Oral  SpO2: 95% 96% 98% 100%  Weight:   84.9 kg   Height:        Intake/Output Summary (Last 24 hours) at 11/15/2020 1320 Last data filed at 11/15/2020 1051 Gross per 24 hour  Intake 240 ml  Output 375 ml  Net -135 ml   Filed Weights   11/13/20 0600 11/14/20 0458 11/15/20 0452  Weight: 90.7 kg 85.3 kg 84.9 kg   Exam  General: Well developed, chronically ill-appearing, NAD  HEENT: NCAT, mucous membranes moist.    Cardiovascular: RRR, S1-S2 auscultated  Respiratory: Diminished breath sounds  Abdomen: Soft, nontender, nondistended, + bowel sounds  Extremities: warm dry without cyanosis clubbing or edema  Neuro: AAOx3, nonfocal  Psych: appropriate mood and affect  Data Reviewed: I have personally reviewed following labs and imaging studies  CBC: Recent Labs  Lab 11/09/20 0452  WBC 14.0*  NEUTROABS 10.7*  HGB 9.8*  HCT 31.0*  MCV 96.0  PLT 161   Basic Metabolic Panel: Recent Labs  Lab 11/09/20 0452  NA 136  K 4.1  CL 98  CO2 28  GLUCOSE 118*  BUN 12  CREATININE 0.91  CALCIUM 8.6*  MG 2.0   GFR: Estimated Creatinine Clearance: 96.5 mL/min (by C-G formula based on SCr of 0.91 mg/dL). Liver Function Tests: Recent Labs  Lab  11/09/20 0452  AST 20  ALT 23  ALKPHOS 102  BILITOT 0.8  PROT 7.9  ALBUMIN 2.3*   No results for input(s): LIPASE, AMYLASE in the last 168 hours. No results for input(s): AMMONIA in the last 168 hours. Coagulation Profile: No results for input(s): INR, PROTIME in the last 168 hours. Cardiac Enzymes: No results for input(s): CKTOTAL, CKMB, CKMBINDEX, TROPONINI in the last 168 hours. BNP (last 3 results) No results for input(s): PROBNP in the last 8760 hours. HbA1C: No results for input(s): HGBA1C in the last 72 hours. CBG: No results for input(s): GLUCAP in the last 168 hours. Lipid Profile: No results for input(s): CHOL, HDL, LDLCALC, TRIG, CHOLHDL, LDLDIRECT in the last 72 hours. Thyroid Function Tests: No results for input(s): TSH, T4TOTAL, FREET4, T3FREE, THYROIDAB in the last 72 hours. Anemia Panel: No results for input(s): VITAMINB12, FOLATE, FERRITIN, TIBC, IRON,  RETICCTPCT in the last 72 hours. Urine analysis: No results found for: COLORURINE, APPEARANCEUR, LABSPEC, PHURINE, GLUCOSEU, HGBUR, BILIRUBINUR, KETONESUR, PROTEINUR, UROBILINOGEN, NITRITE, LEUKOCYTESUR Sepsis Labs: @LABRCNTIP (procalcitonin:4,lacticidven:4)  )No results found for this or any previous visit (from the past 240 hour(s)).    Radiology Studies: No results found.   Scheduled Meds: . enoxaparin (LOVENOX) injection  40 mg Subcutaneous QHS  . escitalopram  20 mg Oral Daily  . melatonin  3 mg Oral QHS  . midodrine  10 mg Oral BID WC  . multivitamins with iron  1 tablet Oral Daily  . pantoprazole  40 mg Oral Daily   Continuous Infusions:   LOS: 50 days   Time Spent in minutes   45 minutes (greater than 50% of time spent with patient face to face, as well as reviewing records, calling family, and formulating a plan)   Matthew [name] D.O. on 11/15/2020 at 1:20 PM  Between 7am to 7pm - Please see pager noted on [email or web address]  After 7pm go to [email or web address]  And look for the night coverage person  covering for me after hours  [facility] Office  [phone]

## 2020-11-15 NOTE — Plan of Care (Signed)
  Problem: Activity: Goal: Risk for activity intolerance will decrease Outcome: Progressing   

## 2020-11-16 DIAGNOSIS — R7401 Elevation of levels of liver transaminase levels: Secondary | ICD-10-CM

## 2020-11-16 MED ORDER — PNEUMOCOCCAL VAC POLYVALENT 25 MCG/0.5ML IJ INJ
0.5000 mL | INJECTION | Freq: Once | INTRAMUSCULAR | Status: AC
  Administered 2020-11-16: 0.5 mL via INTRAMUSCULAR
  Filled 2020-11-16: qty 0.5

## 2020-11-16 NOTE — Progress Notes (Signed)
Discharge and medication education given to patient and spouse. Education given to spouse regarding use of oxygen tank. Medications were picked up from main pharmacy and delivered to patient and spouse.  Pt requested pneumonia vaccine and given by this nurse.   Questions were answered, denies further concerns at this time.   Pt was taken to valet entrance in wheelchair by NT and nurse assisted with pt belongings and concentrators. Pt belongings: shoes, pillow, clothes,hat,wallet/purse,pants and keys.

## 2020-11-16 NOTE — Discharge Summary (Signed)
Physician Discharge Summary  Matthew [name] [id number] DOB: 27-May-1966 DOA: 09/26/2020  PCP: Patient, No Pcp Per  Admit date: 09/26/2020 Discharge date: 11/16/2020  Time spent: 45 minutes  Recommendations for Outpatient Follow-up:  Patient will be discharged to home with home health services and supplemental oxgyen.  Patient will need to follow up with primary care provider within one week of discharge. Follow up with pulmonology.  Patient should continue medications as prescribed.  Patient should follow a heart healthy diet.   Discharge Diagnoses:  Acute hypoxic respiratory failure secondary to COVID-19 viral pneumonia/ARDS Elevated D-dimer Acute on chronic diastolic heart failure Hypotension Transaminitis Bipolar disorder/Depression/Anxiety CAD GERD  Discharge Condition: Stable  Diet recommendation: heart healthy  Filed Weights   11/14/20 0458 11/15/20 0452 11/16/20 0526  Weight: 85.3 kg 84.9 kg 85.7 kg    History of present illness:  on 09/26/2020 by Dr. Imogene Burn 55 y.o.malewith medical [name] a 55 y.o.malewith medical history significant ofCAD status post stent x2, tobacco use, hyperlipidemia, GERD who presents with acutely worsening shortness of breath. Patient has had gradual worsening SOB since 12/9 andGI illness from around 12/10-12/14. On 12/13he was seen at urgent care for diarrhea nausea and vomiting. He was little bit tachycardic there but otherwise stable. He was treated with fluids and supportive care, he declined a Covid test at that time. As above SOB has been gradually worsening, but became significantly worse today.He states that in the 3 hours prior to calling for transport he became significantly more short of breath. Shortness of breath became worse while he was sitting watching TV. When EMS arrived he was saturating 78% on room air with significant rales. He was started on 15 L nonrebreather with improvement to 90%. He is unvaccinated for  Covid. Also reports some fever over the weekend. He denies fever, chest pain, abdominal pain, constipation.  Hospital Course:  Acute hypoxic respiratory failure secondary to COVID-19 viral pneumonia/ARDS -Patient was requiring high amounts of heated high flow as well as nonrebreather however has been weaned down to approximately 7 L as of this morning. -Patient was treated with steroids along with remdesivir and baricitinib -When placed on 4 L of supplemental oxygen, oxygen saturations dropped to 84% -Continue to use incentive spirometry and encourage patient to get out of bed -PT recommended home health, supervision, wheelchair with wheelchair cushion -Of note, patient did have hemoptysis, PCCM was consulted and he will need to follow-up with them as an outpatient for the fibrosis clinic -Patient was able to work with physical therapy today, however had started off at 6 L of oxygen with a oxygen saturation of 92% and requested to be increased to 8 L during activity.  Oxygen saturations varied between 86 to 92% with deep breathing.  Patient was noted to have a drop in his oxygen saturation to 84% however recovered to 91% with sitting. -Currently on 5L of oxygen  -Will discharge patient with oxgyen  Elevated D-dimer -CTA unremarkable x2 along with lower extremity Doppler  -Continue Lovenox for DVT prophylaxis  Acute on chronic diastolic heart failure -Echocardiogram shows an EF 60-65%, mild left ventricular hypertrophy.  No regional wall motion abnormalities. -Currently compensated -patient was diuresed  -Monitor intake and output, daily weights  Hypotension -Continue midodrine -Currently asymptomatic  Transaminitis -Resolved, Likely secondary to COVID-19   Bipolar disorder/Depression/Anxiety -Continue lexapro  CAD -Currently no complaints of chest pain -Patient was on any antiplatelet therapy at home  GERD -Continue PPI  Consultants PCCM  Procedures   Echocardiogram Lower extremity  Doppler  Discharge Exam: Vitals:   11/16/20 0526 11/16/20 0709  BP: 103/71   Pulse: 66   Resp: 19   Temp: 98.3 F (36.8 C)   SpO2: 100% 99%     General: Well developed, chronically ill appearing, NAD  HEENT: NCAT, mucous membranes moist.  Cardiovascular: S1 S2 auscultated, RRR  Respiratory: Diminished but clear  Abdomen: Soft, nontender, nondistended, + bowel sounds  Extremities: warm dry without cyanosis clubbing or edema  Neuro: AAOx3, nonfocal  Psych: appropriate mood and affect, pleasant   Discharge Instructions Discharge Instructions    Discharge instructions   Complete by: As directed    Patient will be discharged to home with home health services and supplemental oxgyen.  Patient will need to follow up with primary care provider within one week of discharge. Follow up with pulmonology.  Patient should continue medications as prescribed.  Patient should follow a heart healthy diet.   Increase activity slowly   Complete by: As directed      Allergies as of 11/16/2020      Reactions   Bee Venom    Codeine Nausea And Vomiting   Onion Swelling   "thoart swells up"      Medication List    TAKE these medications   albuterol 108 (90 Base) MCG/ACT inhaler Commonly known as: VENTOLIN HFA Inhale 2 puffs into the lungs every 6 (six) hours as needed for wheezing or shortness of breath.   benzonatate 200 MG capsule Commonly known as: TESSALON Take 1 capsule (200 mg total) by mouth 3 (three) times daily as needed for cough.   clonazePAM 0.5 MG tablet Commonly known as: KLONOPIN Take 1 tablet (0.5 mg total) by mouth daily as needed (anxiety).   escitalopram 20 MG tablet Commonly known as: LEXAPRO Take 1 tablet (20 mg total) by mouth daily.   midodrine 10 MG tablet Commonly known as: PROAMATINE Take 1 tablet (10 mg total) by mouth 2 (two) times daily with a meal.   multivitamins with iron Tabs tablet Take 1 tablet by mouth  daily.   ondansetron 4 MG disintegrating tablet Commonly known as: Zofran ODT Take 1 tablet (4 mg total) by mouth every 8 (eight) hours as needed for nausea or vomiting.   pantoprazole 40 MG tablet Commonly known as: PROTONIX Take 1 tablet (40 mg total) by mouth daily.            Durable Medical Equipment  (From admission, onward)         Start     Ordered   11/10/20 0813  For home use only DME oxygen  Once       Question Answer Comment  Length of Need 6 Months   Mode or (Route) Nasal cannula   Liters per Minute 5   Frequency Continuous (stationary and portable oxygen unit needed)   Oxygen conserving device Yes   Oxygen delivery system Gas      11/10/20 0813         Allergies  Allergen Reactions  . Bee Venom   . Codeine Nausea And Vomiting  . Onion Swelling    "thoart swells up"    Follow-up Information    POST-COVID [place]. Go on 10/21/2020.   Why: Jan 10th at 2:30pm Contact information: [place] [phone]       Brand Males, MD Follow up.   Specialty: Pulmonary Disease Contact information: [address] [address] [phone]  [place] On 12/11/2020.   Why: will need a follow up , may have to be virtual @8 :30am Contact information: [address] [place] [phone]               The results of significant diagnostics from this hospitalization (including imaging, microbiology, ancillary and laboratory) are listed below for reference.    Significant Diagnostic Studies: CT ANGIO CHEST PE W OR WO CONTRAST  Result Date: 10/31/2020 CLINICAL DATA:  Shortness of breath with history of COVID-19 positivity EXAM: CT ANGIOGRAPHY CHEST WITH CONTRAST TECHNIQUE: Multidetector CT imaging of the chest was performed using the standard protocol during bolus administration of intravenous  contrast. Multiplanar CT image reconstructions and MIPs were obtained to evaluate the vascular anatomy. CONTRAST:  90mL OMNIPAQUE IOHEXOL 350 MG/ML SOLN COMPARISON:  09/27/2020, chest x-ray from 10/29/2019 FINDINGS: Cardiovascular: Thoracic aorta and its branches are within normal limits. Aortic calcifications are seen. Coronary calcifications are noted. No aortic aneurysm is noted. No cardiac enlargement is seen. The pulmonary artery shows a normal branching pattern. No definitive filling defect is identified to suggest pulmonary embolism. Mediastinum/Nodes: Thoracic inlet is within normal limits. Stable right paratracheal lymph node is seen. Increased bilateral hilar adenopathy is noted. This is likely reactive given the changes in the lungs. A 12 mm AP window node is noted new from the prior exam also likely reactive in nature. Esophagus as visualized is within normal limits. Lungs/Pleura: Lungs demonstrate increased parenchymal abnormality bilaterally particularly in the left upper lobe consistent with progressive COVID-19 infiltrate. Some suggestion of cavitation is noted in the upper lobe infiltrate best seen on image number 36 of series 12 although this may simply represent a trapped bulla related to emphysematous change. Underlying emphysematous changes are noted similar to that seen on the prior study. No sizable effusion is noted. Upper Abdomen: Visualized upper abdomen shows right renal cyst stable in appearance. No other focal upper abdominal abnormality is noted. Musculoskeletal: Degenerative changes of the thoracic spine are noted. No acute bony abnormality is seen. Review of the MIP images confirms the above findings. IMPRESSION: Increased parenchymal opacities consistent with the given clinical history of COVID-19 positivity. This is particularly noticeable in the left upper lobe as described. Associated reactive increased lymphadenopathy is noted in the hila bilaterally. No evidence of pulmonary  emboli. Aortic Atherosclerosis (ICD10-I70.0) and Emphysema (ICD10-J43.9). Electronically Signed   By: Inez Catalina M.D.   On: 10/31/2020 23:15   DG Chest Port 1 View  Result Date: 10/28/2020 CLINICAL DATA:  COVID-19 positive, Increased shortness of breath, former smoker EXAM: PORTABLE CHEST 1 VIEW COMPARISON:  Portable exam at 0832 hrs compared to 10/26/2020 FINDINGS: Normal heart size, mediastinal contours, and pulmonary vascularity. Diffuse BILATERAL pulmonary infiltrates consistent with multifocal pneumonia not significantly changed. No pleural effusion or pneumothorax. IMPRESSION: Persistent diffuse BILATERAL pulmonary infiltrates consistent with multifocal pneumonia, not significantly changed. Electronically Signed   By: Lavonia Dana M.D.   On: 10/28/2020 08:56   DG Chest Port 1 View  Result Date: 10/26/2020 CLINICAL DATA:  COVID pneumonia EXAM: PORTABLE CHEST 1 VIEW COMPARISON:  October 21, 2020 FINDINGS: The heart size is stable. Diffuse bilateral hazy airspace opacities are again noted. These appeared to be nearly stable from prior study if not slightly improved. There is no definite pneumothorax. There are probable small bilateral pleural effusions. There is no acute osseous abnormality. IMPRESSION: Persistent diffuse bilateral hazy airspace opacities, similar to slightly improved from prior study. Electronically Signed  By: Constance Holster M.D.   On: 10/26/2020 06:11   DG Chest Port 1 View  Result Date: 10/21/2020 CLINICAL DATA:  Short of breath.  COVID positive EXAM: PORTABLE CHEST 1 VIEW COMPARISON:  None. FINDINGS: Low lung volumes. normal cardiac silhouette. There is bilateral diffuse fine airspace disease. No pleural fluid. No pneumothorax. IMPRESSION: Bilateral diffuse airspace disease consistent with viral pneumonia. Electronically Signed   By: Suzy Bouchard M.D.   On: 10/21/2020 08:17   VAS [place] LOWER EXTREMITY VENOUS (DVT)  Result Date: 11/01/2020  Lower Venous DVT Study Other  Indications: Covid+ "rapidly rising" d-dimer. Comparison Study: Previous study 10/11/21 - Negative Performing Technologist: Rogelia Rohrer  Examination Guidelines: A complete evaluation includes B-mode imaging, spectral Doppler, color Doppler, and power Doppler as needed of all accessible portions of each vessel. Bilateral testing is considered an integral part of a complete examination. Limited examinations for reoccurring indications may be performed as noted. The reflux portion of the exam is performed with the patient in reverse Trendelenburg.  +---------+---------------+---------+-----------+----------+--------------+ RIGHT    CompressibilityPhasicitySpontaneityPropertiesThrombus Aging +---------+---------------+---------+-----------+----------+--------------+ CFV      Full           Yes      Yes                                 +---------+---------------+---------+-----------+----------+--------------+ SFJ      Full                                                        +---------+---------------+---------+-----------+----------+--------------+ FV Prox  Full           Yes      Yes                                 +---------+---------------+---------+-----------+----------+--------------+ FV Mid   Full           Yes      Yes                                 +---------+---------------+---------+-----------+----------+--------------+ FV DistalFull           Yes      Yes                                 +---------+---------------+---------+-----------+----------+--------------+ PFV      Full                                                        +---------+---------------+---------+-----------+----------+--------------+ POP      Full           Yes      Yes                                 +---------+---------------+---------+-----------+----------+--------------+ PTV      Full                                                         +---------+---------------+---------+-----------+----------+--------------+  PERO     Full                                                        +---------+---------------+---------+-----------+----------+--------------+   +---------+---------------+---------+-----------+----------+--------------+ LEFT     CompressibilityPhasicitySpontaneityPropertiesThrombus Aging +---------+---------------+---------+-----------+----------+--------------+ CFV      Full           Yes      Yes                                 +---------+---------------+---------+-----------+----------+--------------+ SFJ      Full                                                        +---------+---------------+---------+-----------+----------+--------------+ FV Prox  Full           Yes      Yes                                 +---------+---------------+---------+-----------+----------+--------------+ FV Mid   Full           Yes      Yes                                 +---------+---------------+---------+-----------+----------+--------------+ FV DistalFull           Yes      Yes                                 +---------+---------------+---------+-----------+----------+--------------+ PFV      Full                                                        +---------+---------------+---------+-----------+----------+--------------+ POP      Full           Yes      Yes                                 +---------+---------------+---------+-----------+----------+--------------+ PTV      Full                                                        +---------+---------------+---------+-----------+----------+--------------+ PERO     Full                                                        +---------+---------------+---------+-----------+----------+--------------+  Summary: BILATERAL: - No evidence of deep vein thrombosis seen in the lower extremities, bilaterally. -No evidence of  popliteal cyst, bilaterally.   *See table(s) above for measurements and observations. Electronically signed by Deitra Mayo MD on 11/01/2020 at 3:09:19 PM.    Final     Microbiology: No results found for this or any previous visit (from the past 240 hour(s)).   Labs: Basic Metabolic Panel: No results for input(s): NA, K, CL, CO2, GLUCOSE, BUN, CREATININE, CALCIUM, MG, PHOS in the last 168 hours. Liver Function Tests: No results for input(s): AST, ALT, ALKPHOS, BILITOT, PROT, ALBUMIN in the last 168 hours. No results for input(s): LIPASE, AMYLASE in the last 168 hours. No results for input(s): AMMONIA in the last 168 hours. CBC: No results for input(s): WBC, NEUTROABS, HGB, HCT, MCV, PLT in the last 168 hours. Cardiac Enzymes: No results for input(s): CKTOTAL, CKMB, CKMBINDEX, TROPONINI in the last 168 hours. BNP: BNP (last 3 results) Recent Labs    11/07/20 0155 11/08/20 0545 11/09/20 0452  BNP 56.8 32.1 31.9    ProBNP (last 3 results) No results for input(s): PROBNP in the last 8760 hours.  CBG: No results for input(s): GLUCAP in the last 168 hours.     Signed:  Cristal Ford  [facility] 11/16/2020, 8:48 AM

## 2020-11-16 NOTE — Progress Notes (Signed)
Called Pharmacy to locate meds for TOC.

## 2020-11-26 NOTE — Telephone Encounter (Signed)
Discharged 11/16/20

## 2020-12-10 ENCOUNTER — Other Ambulatory Visit: Payer: Self-pay

## 2020-12-10 ENCOUNTER — Ambulatory Visit (INDEPENDENT_AMBULATORY_CARE_PROVIDER_SITE_OTHER): Payer: Self-pay | Admitting: Internal Medicine

## 2020-12-10 ENCOUNTER — Ambulatory Visit (INDEPENDENT_AMBULATORY_CARE_PROVIDER_SITE_OTHER): Payer: No Typology Code available for payment source

## 2020-12-10 ENCOUNTER — Encounter: Payer: Self-pay | Admitting: Internal Medicine

## 2020-12-10 VITALS — BP 122/82 | HR 87 | Temp 97.0°F | Ht 70.0 in | Wt 198.8 lb

## 2020-12-10 DIAGNOSIS — J9611 Chronic respiratory failure with hypoxia: Secondary | ICD-10-CM

## 2020-12-10 DIAGNOSIS — Z8616 Personal history of COVID-19: Secondary | ICD-10-CM

## 2020-12-10 DIAGNOSIS — U099 Post covid-19 condition, unspecified: Secondary | ICD-10-CM

## 2020-12-10 DIAGNOSIS — F419 Anxiety disorder, unspecified: Secondary | ICD-10-CM

## 2020-12-10 DIAGNOSIS — J439 Emphysema, unspecified: Secondary | ICD-10-CM

## 2020-12-10 DIAGNOSIS — Z87891 Personal history of nicotine dependence: Secondary | ICD-10-CM

## 2020-12-10 DIAGNOSIS — Z7409 Other reduced mobility: Secondary | ICD-10-CM

## 2020-12-10 LAB — COMPREHENSIVE METABOLIC PANEL
ALT: 36 U/L (ref 0–53)
AST: 28 U/L (ref 0–37)
Albumin: 3.9 g/dL (ref 3.5–5.2)
Alkaline Phosphatase: 107 U/L (ref 39–117)
BUN: 10 mg/dL (ref 6–23)
CO2: 26 mEq/L (ref 19–32)
Calcium: 10.1 mg/dL (ref 8.4–10.5)
Chloride: 99 mEq/L (ref 96–112)
Creatinine, Ser: 0.74 mg/dL (ref 0.40–1.50)
GFR: 101.43 mL/min (ref >60.00)
Glucose, Bld: 117 mg/dL — ABNORMAL HIGH (ref 70–99)
Potassium: 4.2 mEq/L (ref 3.5–5.1)
Sodium: 134 mEq/L — ABNORMAL LOW (ref 135–145)
Total Bilirubin: 0.5 mg/dL (ref 0.2–1.2)
Total Protein: 8.5 g/dL — ABNORMAL HIGH (ref 6.0–8.3)

## 2020-12-10 LAB — CBC WITH DIFFERENTIAL/PLATELET
Basophils Absolute: 0.1 10*3/uL (ref 0.0–0.1)
Basophils Relative: 0.9 % (ref 0.0–3.0)
Eosinophils Absolute: 0.1 10*3/uL (ref 0.0–0.7)
Eosinophils Relative: 0.8 % (ref 0.0–5.0)
HCT: 34 % — ABNORMAL LOW (ref 39.0–52.0)
Hemoglobin: 11.1 g/dL — ABNORMAL LOW (ref 13.0–17.0)
Lymphocytes Relative: 12.6 % (ref 12.0–46.0)
Lymphs Abs: 1.8 10*3/uL (ref 0.7–4.0)
MCHC: 32.7 g/dL (ref 30.0–36.0)
MCV: 93.5 fl (ref 78.0–100.0)
Monocytes Absolute: 0.9 10*3/uL (ref 0.1–1.0)
Monocytes Relative: 6.1 % (ref 3.0–12.0)
Neutro Abs: 11.2 10*3/uL — ABNORMAL HIGH (ref 1.4–7.7)
Neutrophils Relative %: 79.6 % — ABNORMAL HIGH (ref 43.0–77.0)
Platelets: 369 10*3/uL (ref 150.0–400.0)
RBC: 3.64 Mil/uL — ABNORMAL LOW (ref 4.22–5.81)
RDW: 15.7 % — ABNORMAL HIGH (ref 11.5–15.5)
WBC: 14.1 10*3/uL — ABNORMAL HIGH (ref 4.0–10.5)

## 2020-12-10 LAB — HEPATIC FUNCTION PANEL
ALT: 36 U/L (ref 0–53)
AST: 28 U/L (ref 0–37)
Albumin: 3.9 g/dL (ref 3.5–5.2)
Alkaline Phosphatase: 107 U/L (ref 39–117)
Bilirubin, Direct: 0 mg/dL (ref 0.0–0.3)
Total Bilirubin: 0.5 mg/dL (ref 0.2–1.2)
Total Protein: 8.5 g/dL — ABNORMAL HIGH (ref 6.0–8.3)

## 2020-12-10 LAB — BRAIN NATRIURETIC PEPTIDE: Pro B Natriuretic peptide (BNP): 70 pg/mL (ref 0.0–100.0)

## 2020-12-10 LAB — SEDIMENTATION RATE: Sed Rate: 67 mm/hr — ABNORMAL HIGH (ref 0–20)

## 2020-12-10 MED ORDER — PREDNISONE 10 MG (21) PO TBPK: ORAL_TABLET | Freq: Every day | ORAL | 0 refills | Status: DC

## 2020-12-10 MED ORDER — SPIRIVA RESPIMAT 2.5 MCG/ACT IN AERS: 2.0000 | INHALATION_SPRAY | Freq: Every day | RESPIRATORY_TRACT | 5 refills | Status: DC

## 2020-12-10 NOTE — Patient Instructions (Addendum)
Chronic respiratory failure with hypoxia (HCC) History of 2019 novel coronavirus disease (COVID-19) History smoking   -You are significantly improved compared to when you were in the hospital with COVID-19 but still requiring significant amount of oxygen related to post COVID lung inflammation.  It is possible that we get significant amount of improvement from your current status before we call you as of having permanent COVID fibrosis  Plan -Check blood ESR, BNP, CBC, chemistry, liver function test, D-dimer, QuantiFERON gold -Do chest x-ray two-view today -Start prednisone for the next 3-4 weeks as follows  - Please take Take prednisone $RemoveBeforeD'40mg'[id number]$  once daily x 5 days, then $RemoveBe'30mg'MtpSfKdmK$  once daily x 5 days, then $RemoveBe'20mg'hnmpVJBpQ$  once daily x 5 days, then prednisone $RemoveBeforeD'10mg'ErbolodSAEAZru$  once daily  x 5 days and then 5 mg prednisone once daily x5 days and stop   -We extensively discussed the side effects of prednisone -Continue to use oxygen 4 L nasal cannula at rest and 8 L with exertion [titrate to a goal of greater than 88%]  Pulmonary emphysema seen on CT scan 2021   -Start Spiriva Respimat daily -Start albuterol as needed  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 30-minute slot

## 2020-12-10 NOTE — Progress Notes (Signed)
12/04/19- 62 yom smoker ( 78 pack years) for hosp f/u after w/u for hemoptysis. He asked for me because I had cared for father who had lung cancer, as well as his sister and mother. Hosp 2/5-11/20/19 for large volume hemoptysis/ tranexamic acid inhalation Rx, acute respiratory failure/ intubation, abnormal CT chest, leukocytosis, mediastinal adenopathy Bronchoscopy- clot in LUL bronchus Needs repeat CT chest in 6 weeks= mid March. ASA was prescribed as 81 mg after stents years ago, but only taken occasionally.  . Taking only Protonix     Does not get flu vax. Medical hx CAD/ stents, tobacco abuse, hyperlipidemia, GERD,  Outpatient ENT/ Dr Benjamine Mola scoped Neg. Endoscopy- Neg  Hx childhood frequent epistaxis and cauterization. No hx of prior hemoptysis.  Wife says he had developed a harsh, deep cough in recent years. He has not smoked since 2/5 and both he and wife say he has almost completely stopped coughing. Did cough up some clots of old blood in first days after he got home. No wheeze, fever, adenopathy or other bleeding.  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  Rare ETOH without known liver disease. CT chest 11/17/19-  IMPRESSION: 1. There is dense consolidation of the lingula, and the lingular bronchus appears obstructed near the origin (series 7, image 70). This may be by clot, as reported on bronchoscopy. 2. There is some lucency within the lingular consolidation concerning for a developing cavitation (series 7, image 78). 3. Small bilateral pleural effusions and dependent bibasilar atelectasis or consolidation. 4. Enlarged pretracheal lymph nodes measuring up to 2.4 x 1.3 cm, nonspecific and perhaps reactive. 5.  Emphysema (ICD10-J43.9). 6. Small, brightly hyperenhancing foci in the left lobe of the liver and right lobe of the liver. These are nonspecific and could represent flash filling hemangiomas or transient perfusion artifacts. Consider multiphasic  contrast enhanced MRI to further evaluate on a nonacute basis when clinically appropriate. 7. Coronary artery disease.   02/12/20- Virtual Visit via Telephone Note  I connected with Matthew [name] on 02/12/20 at  3:15 PM EDT by telephone and verified that I am speaking with the correct person using two identifiers.  Location: Patient: O Provider: H   I discussed the limitations, risks, security and privacy concerns of performing an evaluation and management service by telephone and the availability of in person appointments. I also discussed with the patient that there may be a patient responsible charge related to this service. The patient expressed understanding and agreed to proceed.   History of Present Illness: 55 yom former smoker ( 78 pack years) for hosp f/u after w/u for / Epistaxis hemoptysis, complicated by hx epistaxis, CAD/ stents, tobacco abuse, hyperlipidemia, GERD,   Feeling much better now with only mild minor cough and no more bleeding.  Quit smoking Feb 5 !!- congratulated.  Observations/Objective: CXR 12/04/19- IMPRESSION: Nearly resolved bilateral heterogeneous airspace opacities. Previously seen bilateral heterogeneous airspace opacities are nearly resolved, in keeping with resolution of multifocal infection. No new airspace opacity.  PFT 02/09/20-  wnl !  Assessment and Plan: Hemoptysis- resolved Tobacco abuse- ended   Follow Up Instructions: Return PRN     OV 12/10/2020  Subjective:  Patient ID: Matthew [name], male , DOB: 1966/02/08 , age 55 y.o. , MRN: [id number] , ADDRESS: [place] [id number] PCP Patient, No Pcp Per Patient Care Team: Patient, No Pcp Per as PCP - General (General Practice)  This Provider for this visit: Treatment Team:  Attending  Provider: Brand Males, MD    12/10/2020 -   Chief Complaint  Patient presents with  . Consult   Referred to ILD center out of concern for post COVID  pulmonary fibrosis and chronic hypoxemic respiratory failure  HPI Matthew [name] 55 y.o. -previously seen by Dr. Annamaria Boots.  He was a smoker had pulmonary emphysema on a CT chest but had normal pulmonary function test in April 2021.  He quit smoking and he was discharged from follow-up.  He also had some hemoptysis over a year ago and the details are above.  He then was admitted between September 26, 2020 through November 16, 2020 4/50 days with COVID-19 pneumonia and acute respiratory failure.  According to him and his wife at one point he was on 100% oxygen.  Never intubated.  Unclear if he got BiPAP but probably not.  He slowly improved and at discharge was on 4 L nasal cannula at rest 8 L with exertion.  He is continue to require that.  He is now living ECOG 3-4 lifestyle.  When he takes a shower he gets easily winded.  In addition he has developed Covid related anxiety that developed in the hospital.  This is somewhat improved because he is less reliant on medications but still feels he wants medications asking me to prescribe but he is lost his primary care physician.  He is very physically deconditioned.  He is now unemployed.  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  Extreme levels of shortness of breath.  He did have elevated D-dimer but he had to CT angiogram chest that was negative for PE.  He had one Doppler that was negative for DVT.  His last chest x-ray 10/28/2020 showed diffuse bilateral infiltrates  His current symptom status below  SYMPTOM SCALE - ILD 12/10/2020   O2 use  4 L nasal cannula at rest 8 L with exertion  Shortness of Breath 0 -> 5 scale with 5 being worst (score 6 If unable to do)  At rest 0  Simple tasks - showers, clothes change, eating, shaving 3  Household (dishes, doing bed, laundry) 6  Shopping 6  Walking level at own pace 6  Walking up Stairs 6  Total (30-36) Dyspnea Score 27  How bad is your cough? 3   How bad is your fatigue yes  How bad is nausea 0  How bad is vomiting?  0  How bad is diarrhea? 0  How bad is anxiety? At time  How bad is depression 0       We did a ILD questionnaire on him  -Past medical: Denies any collagen vascular disease.  Denies any COPD but he does have emphysema on his previous imaging according to Dr. Annamaria Boots.  Denies vasculitis problems.  Denies diabetes or thyroid disease or previous stroke.  Denies hepatitis.  Denies tuberculosis.  Denies kidney disease denies pneumonia denies blood clots.  He did have coronary artery disease and stents in 2010  Review of systems: He has lost 60 pounds after Covid.  He is got a lot of fatigue.  No nausea no vomiting no rash no ulcers  Family history of pulmonary diseases: Denies  Personal exposure history: Started smoking as a teenager and stopped smoking in 2021.  Minimal 1 pack a day.  No pipe smoking.  No marijuana no cocaine no intravenous drug use  Home and hobby details: [place] year [place] home.  He did do some mowing and yard work but no organic antigen exposures at home  Occupational history for organic and inorganic antigens completely negative  Pulmonary toxicity history: Negative      PFT  PFT Results Latest Ref Rng & Units 02/09/2020  FVC-Pre L 4.39  FVC-Predicted Pre % 86  FVC-Post L 4.23  FVC-Predicted Post % 83  Pre FEV1/FVC % % 78  Post FEV1/FCV % % 81  FEV1-Pre L 3.42  FEV1-Predicted Pre % 87  FEV1-Post L 3.42  DLCO uncorrected ml/min/mmHg 26.04  DLCO UNC% % 88  DLCO corrected ml/min/mmHg 26.04  DLCO COR %Predicted % 88  DLVA Predicted % 101  TLC L 6.35  TLC % Predicted % 88  RV % Predicted % 90       has a past medical history of Coronary artery disease (per pt last cardiology visit 2011 (found documentation 12-05-2009 w/ dr Leonia Reeves with [facility] in epic)/  currently pt is followed by pcp), GERD (gastroesophageal reflux disease),  Hyperlipidemia, Olecranon bursitis of left elbow, Respiratory failure ([place]), S/P right coronary artery (RCA) stent placement (12/28/2008), and Wears dentures.   reports that he quit smoking about 12 months ago. His smoking use included cigarettes. He has a 78.00 pack-year smoking history. He has never used smokeless tobacco.  Past Surgical History:  Procedure Laterality Date  . CARDIAC CATHETERIZATION  12-09-2009  dr Ilda Foil   abnormal stress test (12-05-2009) widely patent coronaries present w/ evidence of diffuse atherosclerotic disease/  widely patent stented segments in the proximal and mid segment RCA/  LVEF 65%  . CORONARY ANGIOPLASTY WITH STENT PLACEMENT  12-28-2008  dr berry   PCI and stenting of the proxmial and mid RCA using cutting balloon atherectomy (x2 DES, Xience)/  LVEF >60% w/ normal wall motion  . ESOPHAGOGASTRODUODENOSCOPY N/A 11/17/2019   Procedure: ESOPHAGOGASTRODUODENOSCOPY (EGD);  Surgeon: Carol Ada, MD;  Location: [facility];  Service: Gastroenterology;  Laterality: N/A;  . KNEE ARTHROSCOPY W/ MENISCECTOMY Right 2002  . NEUROPLASTY / TRANSPOSITION ULNAR NERVE AT ELBOW Right 10/2014   dr Caralyn Guile  . OLECRANON BURSECTOMY Left 05/06/2017   Procedure: LEFT ELBOW OLECRANON BURSA EXCISION;  Surgeon: Iran Planas, MD;  Location: [facility];  Service: Orthopedics;  Laterality: Left;  . ORIF RIGHT ANKLE FX  2006  . TONSILLECTOMY  age 36  . TRANSTHORACIC ECHOCARDIOGRAM  03/30/2016   mild concentric LVH, ef 50-55%/  trivial TR    Allergies  Allergen Reactions  . Bee Venom   . Codeine Nausea And Vomiting  . Onion Swelling    "thoart swells up"    Immunization History  Administered Date(s) Administered  . DTaP 01/07/2015  . Pneumococcal Polysaccharide-23 11/16/2020    Family History  Problem Relation Age of Onset  . Cancer Mother   . Cancer Father      Current Outpatient Medications:  .  albuterol (VENTOLIN HFA) 108 (90 Base) MCG/ACT inhaler,  Inhale 2 puffs into the lungs every 6 (six) hours as needed for wheezing or shortness of breath., Disp: 8 g, Rfl: 2 .  escitalopram (LEXAPRO) 20 MG tablet, Take 1 tablet (20 mg total) by mouth daily., Disp: 30 tablet, Rfl: 1 .  midodrine (PROAMATINE) 10 MG tablet, Take 1 tablet (10 mg total) by mouth 2 (two) times daily with a meal., Disp: 60 tablet, Rfl: 1 .  Multiple Vitamins-Minerals (CERTAVITE/ANTIOXIDANTS PO), Take by mouth., Disp: , Rfl:  .  pantoprazole (PROTONIX)  40 MG tablet, Take 1 tablet (40 mg total) by mouth daily., Disp: 30 tablet, Rfl: 1 .  benzonatate (TESSALON) 200 MG capsule, Take 1 capsule (200 mg total) by mouth 3 (three) times daily as needed for cough. (Patient not taking: Reported on 12/10/2020), Disp: 20 capsule, Rfl: 0 .  clonazePAM (KLONOPIN) 0.5 MG tablet, Take 1 tablet (0.5 mg total) by mouth daily as needed (anxiety). (Patient not taking: Reported on 12/10/2020), Disp: 30 tablet, Rfl: 1 .  ondansetron (ZOFRAN ODT) 4 MG disintegrating tablet, Take 1 tablet (4 mg total) by mouth every 8 (eight) hours as needed for nausea or vomiting. (Patient not taking: Reported on 12/10/2020), Disp: 21 tablet, Rfl: 0      Objective:   Vitals:   12/10/20 1038  BP: 122/82  Pulse: 87  Temp: (!) 97 F (36.1 C)  TempSrc: Oral  SpO2: 95%  Weight: 198 lb 12.8 oz (90.2 kg)  Height: $Remove'5\' 10"'ezExIYA$  (1.778 m)    Estimated body mass index is 28.52 kg/m as calculated from the following:   Height as of this encounter: $RemoveBeforeD'5\' 10"'XawAfZCXVulOtW$  (1.778 m).   Weight as of this encounter: 198 lb 12.8 oz (90.2 kg).  $Rem'@WEIGHTCHANGE'FXGl$ @  Filed Weights   12/10/20 1038  Weight: 198 lb 12.8 oz (90.2 kg)     Physical Exam   General: No distress.  Deconditioned male sitting on the wheelchair Neuro: Alert and Oriented x 3. GCS 15. Speech normal Psych: Pleasant Resp:  Barrel Chest - no.  Wheeze -somewhat tachypneic but no wheezing, Crackles -yes, No overt respiratory distress CVS: Normal heart sounds. Murmurs - no Ext:  Stigmata of Connective Tissue Disease - no HEENT: Normal upper airway. PEERL +. No post nasal drip        Assessment:       ICD-10-CM   1. Chronic respiratory failure with hypoxia (HCC)  J96.11   2. History of 2019 novel coronavirus disease (COVID-19)  Z86.16   3. Stopped smoking with less than 1 pack per day history  Z87.891   4. 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 Pulmonary emphysema, unspecified emphysema type ([place])  J43.9    I hope where he is right now is post COVID COPD/Boop and he is steroid responsive.  Most patients without prior lung disease are in the situation and can have significant improvement with 3-4 weeks of prednisone based on anecdotal personal experience and some emerging literature.  Patient would typically do not resolve the oxygen needs people who have had previous underlying ILD.  In his case he said normal pulmonary function test although he had pulmonary emphysema.  Some cautiously optimistic he can have significant amount of improvement but only time will tell.  We will do some blood work today  For his anxiety: Suggested he get a new primary care physician but have given him some tips about meditating daily with visualization meditation or mindfulness meditation using a free YouTube resources which I showed him  He ultimately needs an exercise program to get his mobility and strength back but for this the hypoxemia is to improve    Plan:     Patient Instructions  Chronic respiratory failure with hypoxia ([place]) History of 2019 novel coronavirus disease (COVID-19) History smoking   -You are significantly improved compared to when you were in the hospital with COVID-19 but still  requiring significant amount of oxygen related to post COVID lung inflammation.  It is possible that we get significant amount of improvement from your  current status before we call you as of having permanent COVID fibrosis  Plan -Check blood ESR, BNP, CBC, chemistry, liver function test, D-dimer, QuantiFERON gold -Do chest x-ray two-view today -Start prednisone for the next 3-4 weeks as follows  - Please take Take prednisone $RemoveBeforeD'40mg'SeIYOBklpLXftq$  once daily x 5 days, then $RemoveBe'30mg'nicfaGLVR$  once daily x 5 days, then $RemoveBe'20mg'mxfCotcCF$  once daily x 5 days, then prednisone $RemoveBeforeD'10mg'uvcIrYHTzDqaPV$  once daily  x 5 days and then 5 mg prednisone once daily x5 days and stop   -We extensively discussed the side effects of prednisone -Continue to use oxygen 4 L nasal cannula at rest and 8 L with exertion [titrate to a goal of greater than 88%]  Pulmonary emphysema seen on CT scan 2021   -Start Spiriva Respimat daily -Start albuterol as needed  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 30-minute slot     ( Level 05 visit: Estb 40-54 min  in  visit type: on-site physical face to visit  in total care time and counseling or/and coordination of care by this undersigned MD - Dr Brand Males. This includes one or more of the following on this same day 12/10/2020: pre-charting, chart review, note writing, documentation discussion of test results, diagnostic or treatment recommendations, prognosis, risks and benefits of management options, instructions, education, compliance or risk-factor reduction. It excludes time spent by the [facility] or office staff in the care of the patient. Actual time 53 min)     SIGNATURE     Dr. Brand Males, M.D., F.C.C.P,  Pulmonary and Critical Care Medicine Staff Physician, [place] Director - Interstitial Lung Disease  Program  P[facility] [place], [place], [address]  Pager: [phone], If no answer or between  15:00h - 7:00h: call [phone] Telephone: [phone]  11:13 AM 12/10/2020

## 2020-12-11 ENCOUNTER — Ambulatory Visit: Payer: Self-pay | Admitting: Physician Assistant

## 2020-12-11 ENCOUNTER — Telehealth: Payer: Self-pay | Admitting: Internal Medicine

## 2020-12-11 MED FILL — ESCITALOPRAM 20 MG TABLET: 20 | 30 days supply | Qty: 30 | Fill #0

## 2020-12-11 MED FILL — MIDODRINE HCL 10 MG TABLET: 10 | 30 days supply | Qty: 60 | Fill #0

## 2020-12-11 NOTE — Telephone Encounter (Signed)
Called and spoke with Patient. Patient stated his Spiriva inhaler sample was not working. I talked with Patient about how to put inhaler together.  Patient stated that is what he done and it was still not working.  Offered to assist Patient in office with inhaler, or give him another Spiriva sample.  Patient stated he would be in [facility] tomorrow and would stop by office for assistance with his inhaler.

## 2020-12-11 NOTE — Telephone Encounter (Signed)
  Labs reviewd   - mild hospital anemia but improving (prior 9.8 in jan) - ESR 67 and stil high. Suggests he will respnd to steroids but only time will tell  - d-dimer 0.77 and high but coming down. Suggests he is still at risk for blood clot -> Plan duplex LE rule out DVT    LABS    PULMONARY No results for input(s): PHART, PCO2ART, PO2ART, HCO3, TCO2, O2SAT in the last 168 hours.  Invalid input(s): PCO2, PO2  CBC Recent Labs  Lab 12/10/20 1145  HGB 11.1*  HCT 34.0*  WBC 14.1*  PLT 369.0    COAGULATION No results for input(s): INR in the last 168 hours.  CARDIAC  No results for input(s): TROPONINI in the last 168 hours. Recent Labs  Lab 12/10/20 1145  PROBNP 70.0     CHEMISTRY Recent Labs  Lab 12/10/20 1145  NA 134*  K 4.2  CL 99  CO2 26  GLUCOSE 117*  BUN 10  CREATININE 0.74  CALCIUM 10.1   Estimated Creatinine Clearance: 116.5 mL/min (by C-G formula based on SCr of 0.74 mg/dL).   LIVER Recent Labs  Lab 12/10/20 1145  AST 28  28  ALT 36  36  ALKPHOS 107  107  BILITOT 0.5  0.5  PROT 8.5*  8.5*  ALBUMIN 3.9  3.9     INFECTIOUS No results for input(s): LATICACIDVEN, PROCALCITON in the last 168 hours.   ENDOCRINE CBG (last 3)  No results for input(s): GLUCAP in the last 72 hours.       IMAGING x48h  - image(s) personally visualized  -   highlighted in bold No results found.

## 2020-12-12 LAB — QUANTIFERON-TB GOLD PLUS
Mitogen-NIL: 6.59 IU/mL
NIL: 0.01 IU/mL
QuantiFERON-TB Gold Plus: NEGATIVE
TB1-NIL: 0.01 IU/mL
TB2-NIL: 0.01 IU/mL

## 2020-12-12 LAB — D-DIMER, QUANTITATIVE: D-Dimer, Quant: 0.77 mcg/mL FEU — ABNORMAL HIGH (ref <0.50)

## 2020-12-12 MED ORDER — SPIRIVA RESPIMAT 2.5 MCG/ACT IN AERS: 2.0000 | INHALATION_SPRAY | Freq: Every day | RESPIRATORY_TRACT | 0 refills | Status: DC

## 2020-12-12 MED FILL — PANTOPRAZOLE SOD DR 40 MG T: 40 | 30 days supply | Qty: 30 | Fill #0

## 2020-12-12 NOTE — Addendum Note (Signed)
Addended by: Valerie Salts on: 12/12/2020 11:35 AM   Modules accepted: Orders

## 2020-12-12 NOTE — Telephone Encounter (Signed)
Patient's wife came into the office today with the Spiriva sample. She stated that it was not working properly. After inspecting the inhaler, it appears the canister was not inserted completely into the device. I pushed the canister back into the device and it worked. Since they had been trying to get the sample to work, I provided her with another sample. She verbalized understanding.

## 2020-12-31 NOTE — Progress Notes (Signed)
@Patient  ID: Matthew [name], male    DOB: 12/24/1965, 55 y.o.   MRN: [id number]  Chief Complaint  Patient presents with   Shortness of Breath    Reports breathing "a lot better"    Referring provider: No ref. provider found  HPI: 55 year old male, former smoker quit in February 2021 (78-pack-year history).  Past medical history significant for respiratory failure with hypoxia, COVID-19, coronary artery disease, tobacco abuse.  Patient of [name], seen for initial consult on 12/10/2020.  Previous [facility] pulmonary encounters: 12/10/20- [name], consult Referred to ILD center out of concern for post COVID pulmonary fibrosis and chronic hypoxemic respiratory failure  HPI Matthew [name] 55 y.o. -previously seen by Dr. Annamaria Boots.  He was a smoker had pulmonary emphysema on a CT chest but had normal pulmonary function test in April 2021.  He quit smoking and he was discharged from follow-up.  He also had some hemoptysis over a year ago and the details are above.  He then was admitted between September 26, 2020 through November 16, 2020 4/50 days with COVID-19 pneumonia and acute respiratory failure.  According to him and his wife at one point he was on 100% oxygen.  Never intubated.  Unclear if he got BiPAP but probably not.  He slowly improved and at discharge was on 4 L nasal cannula at rest 8 L with exertion.  He is continue to require that.  He is now living ECOG 3-4 lifestyle.  When he takes a shower he gets easily winded.  In addition he has developed Covid related anxiety that developed in the hospital.  This is somewhat improved because he is less reliant on medications but still feels he wants medications asking me to prescribe but he is lost his primary care physician.  He is very physically deconditioned.  He is now unemployed.  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  Extreme levels of shortness of breath.  He did have  elevated D-dimer but he had to CT angiogram chest that was negative for PE.  He had one Doppler that was negative for DVT.  His last chest x-ray 10/28/2020 showed diffuse bilateral infiltrates   SYMPTOM SCALE - ILD 12/10/2020   O2 use  4 L nasal cannula at rest 8 L with exertion  Shortness of Breath 0 -> 5 scale with 5 being worst (score 6 If unable to do)  At rest 0  Simple tasks - showers, clothes change, eating, shaving 3  Household (dishes, doing bed, laundry) 6  Shopping 6  Walking level at own pace 6  Walking up Stairs 6  Total (30-36) Dyspnea Score 27  How bad is your cough? 3  How bad is your fatigue yes  How bad is nausea 0  How bad is vomiting?  0  How bad is diarrhea? 0  How bad is anxiety? At time  How bad is depression 0    We did a ILD questionnaire on him  -Past medical: Denies any collagen vascular disease.  Denies any COPD but he does have emphysema on his previous imaging according to Dr. Annamaria Boots.  Denies vasculitis problems.  Denies diabetes or thyroid disease or previous stroke.  Denies hepatitis.  Denies tuberculosis.  Denies kidney disease denies pneumonia denies blood clots.  He did have coronary artery disease and stents in 2010  Review of systems: He has lost 60 pounds after Covid.  He is got a  lot of fatigue.  No nausea no vomiting no rash no ulcers  Family history of pulmonary diseases: Denies  Personal exposure history: Started smoking as a teenager and stopped smoking in 2021.  Minimal 1 pack a day.  No pipe smoking.  No marijuana no cocaine no intravenous drug use  Home and hobby details: [place].  He did do some mowing and yard work but no organic antigen exposures at home  Occupational history for organic and inorganic antigens completely negative  Pulmonary toxicity history: Negative    01/01/2021 - Interim hx Patient was admitted in December for acute respiratory failure  secondary to Covid infection. 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 No prior lung disease except for pulmonary emphysema. During last visit he was still requiring 4 L oxygen at rest and 8 L oxygen on exertion. He was given prescription for prednisone taper over the next 2 weeks and started on Spiriva 2.6mcg. Needs physical therapy ideally but patient does not have insurance  Accompanied by his wife today. He feels great, has a lot more energy. He tells me that he has been off oxygen during the day since march 5th. He used O2 for 5 days at night. He stopped using because he was taking it off in the middle of the nght. Since the 10th he has been off oxygen altogether. He is doing much more at home. He is walking without issue, previsisouly he was in a wheelchair. He has some chest congestion with a productive cough. Slight chest tightness but not all the time. His appetite is normal. He used spiriva two or three times without a noticeable difference in his breathing so he stopped using. Currently weaning off prednisone. He could tell a difference when tapering from 10mg  to 5mg , he has been more reliant on o2 since then. He does not currently have insurance since he is not working. He drives trucks for a living.    12/10/20 Labs: Dimer 0.77 Sed rate elevated at 67 BNP 70 WBC 14.1, baseline since January (on oral steriods) Quantiferon TB Gold negative  Observations/Objective: CXR 12/04/19- IMPRESSION: Nearly resolved bilateral heterogeneous airspace opacities. Previously seen bilateral heterogeneous airspace opacities are nearly resolved, in keeping with resolution of multifocal infection. No new airspace opacity.   Allergies  Allergen Reactions   Bee Venom    Codeine Nausea And Vomiting   Onion Swelling    "thoart swells up"    Immunization History  Administered Date(s) Administered   DTaP 01/07/2015   Pneumococcal Polysaccharide-23 11/16/2020    Past  Medical History:  Diagnosis Date   Coronary artery disease per pt last cardiology visit 2011 (found documentation 12-05-2009 w/ dr Leonia Reeves with [facility] in epic)/  currently pt is followed by pcp   12-28-2008  PCI and DES x2 to proximal and mid RCA/ last cardiac cath 12-09-2009  widely patent stents   GERD (gastroesophageal reflux disease)    Hyperlipidemia    Olecranon bursitis of left elbow    Respiratory failure ([place])    S/P right coronary artery (RCA) stent placement 12/28/2008   PCI and DES x2-- proximal and mid RCA   Wears dentures    UPPER    Tobacco History: Social History   Tobacco Use  Smoking Status Former Smoker   Packs/day: 2.00   Years: 39.00   Pack years: 78.00   Types: Cigarettes   Quit date: 11/17/2019  Years since quitting: 1.1  Smokeless Tobacco Never Used  Tobacco Comment   since age 32   Counseling given: Not Answered Comment: since age 27   Outpatient Medications Prior to Visit  Medication Sig Dispense Refill   escitalopram (LEXAPRO) 20 MG tablet Take 1 tablet (20 mg total) by mouth daily. 30 tablet 1   pantoprazole (PROTONIX) 40 MG tablet Take 1 tablet (40 mg total) by mouth daily. 30 tablet 1   albuterol (VENTOLIN HFA) 108 (90 Base) MCG/ACT inhaler Inhale 2 puffs into the lungs every 6 (six) hours as needed for wheezing or shortness of breath. (Patient not taking: Reported on 01/01/2021) 8 g 2   benzonatate (TESSALON) 200 MG capsule Take 1 capsule (200 mg total) by mouth 3 (three) times daily as needed for cough. (Patient not taking: Reported on 01/01/2021) 20 capsule 0   clonazePAM (KLONOPIN) 0.5 MG tablet Take 1 tablet (0.5 mg total) by mouth daily as needed (anxiety). (Patient not taking: Reported on 01/01/2021) 30 tablet 1   midodrine (PROAMATINE) 10 MG tablet Take 1 tablet (10 mg total) by mouth 2 (two) times daily with a meal. (Patient not taking: Reported on 01/01/2021) 60 tablet 1   Multiple Vitamins-Minerals (CERTAVITE/ANTIOXIDANTS  PO) Take by mouth. (Patient not taking: Reported on 01/01/2021)     ondansetron (ZOFRAN ODT) 4 MG disintegrating tablet Take 1 tablet (4 mg total) by mouth every 8 (eight) hours as needed for nausea or vomiting. (Patient not taking: Reported on 01/01/2021) 21 tablet 0   predniSONE (STERAPRED UNI-PAK 21 TAB) 10 MG (21) TBPK tablet Take by mouth daily. Take 40mg  daily for 5 days, 30mg  for 5 days, 20mg  for 5 days then 10mg  for 5 days then 5mg  for 5 days then stop. (Patient not taking: Reported on 01/01/2021) 55 tablet 0   Tiotropium Bromide Monohydrate (SPIRIVA RESPIMAT) 2.5 MCG/ACT AERS Inhale 2 puffs into the lungs daily. (Patient not taking: Reported on 01/01/2021) 1 each 5   Tiotropium Bromide Monohydrate (SPIRIVA RESPIMAT) 2.5 MCG/ACT AERS Inhale 2 puffs into the lungs daily. (Patient not taking: Reported on 01/01/2021) 4 g 0   No facility-administered medications prior to visit.      Review of Systems  Review of Systems  Constitutional: Negative for activity change, appetite change, chills, fatigue and fever.  HENT: Positive for congestion.   Respiratory: Positive for cough and shortness of breath. Negative for chest tightness and wheezing.   Cardiovascular: Negative for chest pain and leg swelling.    Physical Exam  BP 132/76 (BP Location: Left Arm)    Pulse 93    Temp 97.6 F (36.4 C)    Ht 5\' 11"  (1.803 m)    Wt 209 lb (94.8 kg)    SpO2 90%    BMI 29.15 kg/m  Physical Exam Constitutional:      General: He is not in acute distress.    Appearance: He is well-developed. He is not ill-appearing.  HENT:     Head: Normocephalic and atraumatic.     Mouth/Throat:     Comments: Deferred d/t masking Cardiovascular:     Rate and Rhythm: Normal rate and regular rhythm.  Pulmonary:     Effort: Pulmonary effort is normal.     Breath sounds: Normal breath sounds.  Skin:    Comments: Pale skin  Neurological:     General: No focal deficit present.     Mental Status: He is alert and  oriented to person, place, and time. Mental status is at  baseline.  Psychiatric:        Mood and Affect: Mood normal.        Behavior: Behavior normal.        Thought Content: Thought content normal.        Judgment: Judgment normal.      Lab Results:  CBC    Component Value Date/Time   WBC 15.3 (H) 01/01/2021 1048   RBC 4.12 (L) 01/01/2021 1048   HGB 13.0 01/01/2021 1048   HCT 38.4 (L) 01/01/2021 1048   PLT 190.0 01/01/2021 1048   MCV 93.3 01/01/2021 1048   MCH 30.3 11/09/2020 0452   MCHC 33.8 01/01/2021 1048   RDW 15.9 (H) 01/01/2021 1048   LYMPHSABS 1.8 12/10/2020 1145   MONOABS 0.9 12/10/2020 1145   EOSABS 0.1 12/10/2020 1145   BASOSABS 0.1 12/10/2020 1145    BMET    Component Value Date/Time   NA 134 (L) 12/10/2020 1145   K 4.2 12/10/2020 1145   CL 99 12/10/2020 1145   CO2 26 12/10/2020 1145   GLUCOSE 117 (H) 12/10/2020 1145   BUN 10 12/10/2020 1145   CREATININE 0.74 12/10/2020 1145   CALCIUM 10.1 12/10/2020 1145   GFRNONAA >60 11/09/2020 0452   GFRAA >60 11/20/2019 0311    BNP    Component Value Date/Time   BNP 31.9 11/09/2020 0452    ProBNP    Component Value Date/Time   PROBNP 70.0 12/10/2020 1145    Imaging: DG Chest 2 View  Result Date: 12/11/2020 CLINICAL DATA:  55 year old male with history of chronic respiratory failure and hypoxia following COVID infection. EXAM: CHEST - 2 VIEW COMPARISON:  Chest x-ray 10/28/2020. FINDINGS: Lung volumes are very low. Widespread ill-defined opacities and patchy areas of interstitial prominence are noted throughout the lungs bilaterally, overall, improved compared to the prior study from 10/28/2020. No pleural effusions. Pulmonary vasculature is normal. Heart size is normal. Upper mediastinal contours are within normal limits. IMPRESSION: 1. The appearance of the lungs is compatible with resolving multilobar bilateral pneumonia no likely with developing post infectious fibrosis. These findings could be better  evaluated with follow-up nonemergent high-resolution chest CT if clinically appropriate. Electronically Signed   By: Vinnie Langton M.D.   On: 12/11/2020 09:08     Assessment & Plan:   Respiratory failure ([place]) - Currently weaning oxygen needs - O2 desaturated to 82% RA after 1/2 lap, requiring 2-3L on exertion and at night  - Unable to attend pulmonary rehab d/t cost but he is working on increasing his activity at home  COVID-19 virus infection Prolonged hospital stay for Covid pneumonia/respiratory failure from December 2021-February 2022. 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 CXR on 12/10/20 showed resolving multilobular bilateral pneumonia. May need dedicated CT imaging to monitor for potential developing fibrosis.   He continues to improve clinically. He has been responsive to steriods. Weaning oxygen needs. Breathing a little more labored when tapering prednisone from 10mg  to 5mg . Recommend alternating 10/5mg  dose days for next week and then resume 5mg  until follow-up with [name]. Sending in [place] for minor bronchitis symptoms he is having and recommending mucinex twice daily.He stopped Spiriva d/t lack of noticeable difference in his breathing. PFTs were normal prior to covid dx. D-dimer trending down. Sed rate remains elevated. Next office visit is scheduled for 01/13/21.   Recommendations: - Take 10mg  prednisone today; alternating with 5mg  every other day for x 1 week. Then taper back  down to 5mg  daily until follow-up with [name] - Start mucinex 600mg  twice daily and take Zpack as prescribed for bronchitis  - Ok to stop Spiriva, we will remove from medication list  - Use albuterol rescue inhaler 2 puffs every 4-6 hours as needed for shortness of breath/wheezing       Martyn Ehrich, NP 01/02/2021

## 2021-01-01 ENCOUNTER — Ambulatory Visit: Payer: No Typology Code available for payment source | Admitting: Primary Care

## 2021-01-01 ENCOUNTER — Encounter: Payer: Self-pay | Admitting: Primary Care

## 2021-01-01 ENCOUNTER — Ambulatory Visit (INDEPENDENT_AMBULATORY_CARE_PROVIDER_SITE_OTHER): Payer: No Typology Code available for payment source | Admitting: Primary Care

## 2021-01-01 ENCOUNTER — Other Ambulatory Visit: Payer: Self-pay

## 2021-01-01 VITALS — BP 132/76 | HR 93 | Temp 97.6°F | Ht 71.0 in | Wt 209.0 lb

## 2021-01-01 DIAGNOSIS — Z8616 Personal history of COVID-19: Secondary | ICD-10-CM

## 2021-01-01 DIAGNOSIS — J9611 Chronic respiratory failure with hypoxia: Secondary | ICD-10-CM

## 2021-01-01 DIAGNOSIS — U071 COVID-19: Secondary | ICD-10-CM

## 2021-01-01 LAB — CBC
HCT: 38.4 % — ABNORMAL LOW (ref 39.0–52.0)
Hemoglobin: 13 g/dL (ref 13.0–17.0)
MCHC: 33.8 g/dL (ref 30.0–36.0)
MCV: 93.3 fl (ref 78.0–100.0)
Platelets: 190 10*3/uL (ref 150.0–400.0)
RBC: 4.12 Mil/uL — ABNORMAL LOW (ref 4.22–5.81)
RDW: 15.9 % — ABNORMAL HIGH (ref 11.5–15.5)
WBC: 15.3 10*3/uL — ABNORMAL HIGH (ref 4.0–10.5)

## 2021-01-01 LAB — D-DIMER, QUANTITATIVE: D-Dimer, Quant: 0.38 mcg/mL FEU (ref <0.50)

## 2021-01-01 LAB — SEDIMENTATION RATE: Sed Rate: 67 mm/h — ABNORMAL HIGH (ref 0–20)

## 2021-01-01 MED ORDER — AZITHROMYCIN 250 MG PO TABS: ORAL_TABLET | ORAL | 0 refills | Status: DC

## 2021-01-01 MED ORDER — PREDNISONE 5 MG PO TABS
5.0000 mg | ORAL_TABLET | Freq: Every day | ORAL | 0 refills | Status: DC
Start: 2021-01-01 — End: 2021-02-20

## 2021-01-01 NOTE — Patient Instructions (Addendum)
Nice to meet you Matthew [name], I'm glad you are feeling so well  Recommendations:  - Take 10mg  prednisone today; alternating with 5mg  every other day for x 1 week. Then taper back down to 5mg  daily until follow-up with [name] - Start mucinex 600mg  twice daily and take Zpack as prescribed for bronchitis  - Ok to stop Spiriva, we will remove from medication list  - Use albuterol rescue inhaler 2 puffs every 4-6 hours as needed for shortness of breath/wheezing  - We will hold off on ONO d/t cost, please wear oxygen at night   Oxygen instructions: - You can be off oxygen at rest  - You need to continue to use 2-3L oxygen on exertion and at night   Orders: - Labs (d-dimer, cbc, sed rate) - Ambulatory O2 check today  In office    Follow-up: - April 4th with [name]     How to Increase Your Level of Physical Activity Getting regular physical activity is important for your overall health and well-being. Most people do not get enough exercise. There are easy ways to increase your level of physical activity, even if you have not been very active in the past or if you are just starting out. How can increasing my physical activity affect me? Physical activity has many short-term and long-term benefits. Being active on a regular basis can improve your physical and mental health as well as provide other benefits. Physical health benefits  Helping you lose weight or maintain a healthy weight.  Strengthening your muscles and bones.  Reducing your risk of certain long-term (chronic) diseases, including heart disease, cancer, and diabetes.  Being able to move around more easily and for longer periods of time without getting tired (increased stamina).  Improving your ability to fight off illness (enhanced immunity).  Being able to sleep better.  Helping you stay healthy as you get older, including: ? Helping you stay mobile, or capable of walking and moving  around. ? Preventing accidents, such as falls. ? Increasing life expectancy. Mental health benefits  Boosting your mood and improving your self-esteem.  Lowering your chance of having mental health problems, such as depression or anxiety.  Helping you feel good about your body. Other benefits  Finding new sources of fun and enjoyment.  Meeting new people who share a common interest. What steps can I take to be more physically active? Getting started  If you have a chronic illness or have not been active for a while, check with your health care provider about how to get started. Ask your health care provider what activities are safe for you.  Start out slowly. Walking or doing some simple chair exercises is a good place to start, especially if you have not been active before or for a long time.  Set goals that you can work toward. Ask your health care provider how much exercise is best for you. In general, most adults should: ? Do moderate-intensity exercise for at least 150 minutes each week (30 minutes on most days of the week) or vigorous exercise for at least 75 minutes each week, or a combination of these.  Moderate-intensity exercise can include walking at a quick pace, biking, yoga, water aerobics, or gardening.  Vigorous exercise involves activities that take more effort, such as jogging or running, playing sports, swimming laps, or jumping rope. ? Do strength exercises on at least 2 days each week. This can include weight lifting, body weight exercises, and resistance-band exercises.  Consider using a fitness tracker, such as a mobile phone app or a device worn like a watch, that will count the number of steps you take each day. Many people strive to reach 10,000 steps a day. Choosing activities  Try to find activities that you enjoy. You are more likely to commit to an exercise routine if it does not feel like a chore.  If you have bone or joint problems, choose low-impact  exercises, like walking or swimming.  Use these tips for being successful with an exercise plan: ? Find a workout partner for accountability. ? Join a group or class, such as an aerobics class, cycling class, or sports team. ? Make family time active. Go for a walk, bike, or swim. ? Include a variety of exercises each week. Being active in your daily routines Besides your formal exercise plans, you can find ways to do physical activity during your daily routines, such as:  Walking or biking to work or to the store.  Taking the stairs instead of the elevator.  Parking farther away from the door at work or at the store.  Planning walking meetings.  Walking around while you are on the phone.   Where to find more information  Centers for Disease Control and Prevention: WorkDashboard.es  President's Council on Fitness, Sports & Nutrition: www.fitness.gov  ChooseMyPlate: FormerBoss.no Contact a health care provider if:  You have headaches, muscle aches, or joint pain.  You feel dizzy or light-headed while exercising.  You faint.  You have chest pain while exercising. Summary  Exercise benefits your mind and body at any age, even if you are just starting out.  If you have a chronic illness or have not been active for a while, check with your health care provider before increasing your physical activity.  Choose activities that are safe and enjoyable for you. Ask your health care provider what activities are safe for you.  Start slowly. Tell your health care provider if you have problems as you start to increase your activity level. This information is not intended to replace advice given to you by your health care provider. Make sure you discuss any questions you have with your health care provider. Document Revised: 07/03/2019 Document Reviewed: 04/24/2019 Elsevier Patient Education  2021 [place].    Chronic Respiratory Failure  Respiratory  failure is a condition in which the lungs do not work well and the breathing (respiratory) system fails. When respiratory failure occurs, it becomes difficult for the lungs to get enough oxygen or to eliminate carbon dioxide (or both). If the lungs do not work properly, the heart, brain, and other body systems do not get enough oxygen. Respiratory failure is life-threatening if not treated. Respiratory failure can be acute or chronic. Acute respiratory failure is sudden and severe and requires emergency medical treatment. Chronic respiratory failure happens over time--months to years--and is usually due to a medical condition that gets worse. What are the causes? This condition may be caused by any problem that affects the heart or lungs. Causes include:  Lung or airway disease, such as: ? Chronic bronchitis and emphysema (COPD). ? Asthma. ? Cystic fibrosis. ? Pulmonary hypertension. ? Pulmonary fibrosis. This is scarring of the lung tissue.  Infection, such as pneumonia.  Nerve or muscle injury or diseases that make chest movements difficult, such as: ? Lou Gehrig's disease. ? Guillain-Barre syndrome. ? Stroke. ? Spinal cord injuries.  Fluid in the lungs (pulmonary edema). This may be due to heart failure  or lung injury.  A blood clot in a lung (pulmonary embolism).  Trauma to the chest that makes breathing difficult. This may include a collapsed lung (pneumothorax). What increases the risk? You are more likely to develop this condition if:  You smoke or vape, have a history of smoking or vaping, or have exposure to secondhand smoke.  You have a weak immune system.  You have a family history of breathing problems or lung disease.  You have sleep apnea.  You have congestive heart failure.  You are obese.  You have been exposed to hazardous substances at work, such as chemicals, asbestos, industrial dyes, or chemical fumes. What are the signs or symptoms? Symptoms of this  condition include:  Shortness of breath or difficulty breathing.  A cough with or without mucus (sputum).  Wheezing.  Chest pain or tightness.  A bluish color to the fingernail or toenail beds.  Confusion.  Drowsiness or feeling tired easily, especially with minimal activity. How is this diagnosed? This condition may be diagnosed based on:  Your medical history.  A physical exam.  Other tests, such as: ? Imaging tests. These may include a chest X-ray or CT scan. ? Blood tests, such as an arterial blood gas test. This test is done to check if you have enough oxygen in your blood or if your carbon dioxide levels are too high. ? An electrocardiogram. This test records the electrical activity of your heart. ? An echocardiogram. This test uses sound waves to make an image of your heart. ? Pulmonary function tests. These help to determine if you have chronic lung disease. ? Bronchoscopy. During this test, your health care provider uses a tube with a camera to look inside your airways for signs of inflammation or other problems. How is this treated? Treatment for this condition depends on the cause. Treatment may include:  Breathing in oxygen through a tube with prongs that sit in your nose (nasal cannula) or through a mask that fits over your face.  Receiving noninvasive positive pressure ventilation, called CPAP or BPAP. This is a method of breathing support in which a machine blows air into your lungs through a mask.  Medicines to help with breathing, such as: ? Medicines that open up and relax air passages, such as bronchodilators. These may be given through a device that turns liquid medicines into a mist you can breathe in (nebulizer). These medicines help with breathing. ? Diuretics. These medicines get rid of extra fluid in your lungs, which can help you breathe better. ? Steroid medicines. These decrease inflammation in the lungs. ? Antibiotic medicines. These may be given  to treat a bacterial infection, such as pneumonia. ? Blood thinners (anticoagulants) to treat blood clots in the lungs.  Pulmonary rehabilitation. This is an exercise program that strengthens the muscles in your chest and helps you learn breathing techniques to manage your condition.  Using a ventilator. This is a breathing machine that delivers oxygen to the lungs through a breathing tube that is put into the trachea. This machine is used when you can no longer breathe well enough on your own. Follow these instructions at home: Medicines  Take over-the-counter and prescription medicines only as told by your health care provider.  If you were prescribed an antibiotic medicine, take it as told by your health care provider. Do not stop taking the antibiotic even if you start to feel better. Lifestyle  Do not use any products that contain nicotine or tobacco, such  as cigarettes, e-cigarettes, and chewing tobacco. If you need help quitting, ask your health care provider. Avoid secondhand smoke.  Avoid exposure to irritants that make your breathing problems worse. These include smoke, chemicals, and fumes. General instructions  Use oxygen therapy and do pulmonary rehabilitation if directed by your health care provider. If you require home oxygen therapy, ask your health care provider whether you should purchase a pulse oximeter to measure your oxygen level at home.  If you were given a CPAP machine, make sure that you understand how to use, clean, and care for the machine and that you use it as directed.  Stay active, but balance activity with periods of rest. Exercise and physical activity will help you maintain your ability to do things you want to do.  Stay up to date on all vaccines, especially pneumonia and yearly flu (influenza) vaccines.  Avoid people who are sick and avoid crowded places during the flu season.  Work with your health care provider to create a plan to help you deal with  your condition. Follow this plan.  Keep all follow-up visits as told by your health care provider. This is important. Contact a health care provider if:  Your shortness of breath gets worse and you cannot do the things you used to do.  You have increased sputum, wheezing, coughing, or loss of energy.  You are on oxygen therapy and you are starting to need more.  You need to use your medicines more often.  You have a fever of 100.64F (38C) or higher. Get help right away if:  Your shortness of breath becomes suddenly or significantly worse.  You develop chest pain, tightness, or pressure.  You are unable to say more than a few words without having to catch your breath.  Your lips, toenails, or fingernails are a bluish color.  You become confused or difficult to wake up. These symptoms may represent a serious problem that is an emergency. Do not wait to see if the symptoms will go away. Get medical help right away. Call your local emergency services (911 in the [place] Do not drive yourself to the hospital. Summary  Respiratory failure is a condition in which the lungs do not work well and the breathing system fails.  This condition can be very serious and is often life-threatening. Chronic respiratory failure has many causes, including COPD, lung infections, and fluid in the lungs. Chronic respiratory failure is lifelong, but its acute symptoms can be very dangerous.  This condition is diagnosed with specific tests and can be treated with medicines, oxygen, or both.  Contact a health care provider if your shortness of breath gets worse, you develop chest pain or fever, or you need to use your oxygen or medicines more often than before. This information is not intended to replace advice given to you by your health care provider. Make sure you discuss any questions you have with your health care provider. Document Revised: 11/03/2019 Document Reviewed: 11/03/2019 Elsevier Patient  Education  2021 [employer].

## 2021-01-01 NOTE — Progress Notes (Addendum)
D-dimer was normal, WBC is still elevated but this is likely from the prednisone he is on. Anemia is better.

## 2021-01-02 ENCOUNTER — Encounter: Payer: Self-pay | Admitting: Primary Care

## 2021-01-02 NOTE — Assessment & Plan Note (Addendum)
Prolonged hospital stay for Covid pneumonia/respiratory failure from December 2021-February 2022. 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 CXR on 12/10/20 showed resolving multilobular bilateral pneumonia. May need dedicated CT imaging to monitor for potential developing fibrosis.   He continues to improve clinically. He has been responsive to steriods. Weaning oxygen needs. Breathing a little more labored when tapering prednisone from 10mg  to 5mg . Recommend alternating 10/5mg  dose days for next week and then resume 5mg  until follow-up with [name]. Sending in [place] for minor bronchitis symptoms he is having and recommending mucinex twice daily.He stopped Spiriva d/t lack of noticeable difference in his breathing. PFTs were normal prior to covid dx. D-dimer trending down. Sed rate remains elevated. Next office visit is scheduled for 01/13/21.   Recommendations: - Take 10mg  prednisone today; alternating with 5mg  every other day for x 1 week. Then taper back down to 5mg  daily until follow-up with [name] - Start mucinex 600mg  twice daily and take Zpack as prescribed for bronchitis  - Ok to stop Spiriva, we will remove from medication list  - Use albuterol rescue inhaler 2 puffs every 4-6 hours as needed for shortness of breath/wheezing

## 2021-01-02 NOTE — Assessment & Plan Note (Addendum)
-   Currently weaning oxygen needs - O2 desaturated to 82% RA after 1/2 lap, requiring 2-3L on exertion and at night  - Unable to attend pulmonary rehab d/t cost but he is working on increasing his activity at home

## 2021-01-08 ENCOUNTER — Ambulatory Visit: Payer: Self-pay | Attending: Physician Assistant | Admitting: Physician Assistant

## 2021-01-08 ENCOUNTER — Other Ambulatory Visit: Payer: Self-pay

## 2021-01-08 ENCOUNTER — Other Ambulatory Visit: Payer: Self-pay | Admitting: Physician Assistant

## 2021-01-08 DIAGNOSIS — Z09 Encounter for follow-up examination after completed treatment for conditions other than malignant neoplasm: Secondary | ICD-10-CM

## 2021-01-08 DIAGNOSIS — U071 COVID-19: Secondary | ICD-10-CM

## 2021-01-08 DIAGNOSIS — J9601 Acute respiratory failure with hypoxia: Secondary | ICD-10-CM

## 2021-01-08 DIAGNOSIS — Z87891 Personal history of nicotine dependence: Secondary | ICD-10-CM

## 2021-01-08 DIAGNOSIS — F32A Depression, unspecified: Secondary | ICD-10-CM

## 2021-01-08 MED ORDER — ESCITALOPRAM OXALATE 20 MG PO TABS: 20.0000 mg | ORAL_TABLET | Freq: Every day | ORAL | 1 refills | Status: DC

## 2021-01-08 NOTE — Progress Notes (Signed)
Patient ID: Matthew [name], male   DOB: Feb 21, 1966, 55 y.o.   MRN: [id number]   Virtual Visit via Telephone Note  I connected with [name] on 01/08/21 at 10:30 AM EDT by telephone and verified that I am speaking with the correct person using two identifiers.  Location: Patient: home Provider: [facility] office   I discussed the limitations, risks, security and privacy concerns of performing an evaluation and management service by telephone and the availability of in person appointments. I also discussed with the patient that there may be a patient responsible charge related to this service. The patient expressed understanding and agreed to proceed.   History of Present Illness: After hospitalization 12/16-2/02/2021 with Covid 19.  He is being followed by Pulmonology and last appt was 01/01/2021(see note below. He is doing much better.  Energy levels continue to improve.  Fortunately he had quit smoking almost 1 year prior to getting Covid.  He is no longer using O2.  He has been on Lexapro for a while and it works well for him.  He does need a RF.  Has a f/up appt with [name] on Monday 01/13/2021  From discharge summary-discharge diagnoses: Discharge Diagnoses:  Acute hypoxic respiratory failure secondary to COVID-19 viral pneumonia/ARDS Elevated D-dimer Acute on chronic diastolic heart failure Hypotension Transaminitis Bipolar disorder/Depression/Anxiety CAD GERD    HPI: 55 year old male, former smoker quit in February 2021 (78-pack-year history).  Past medical history significant for respiratory failure with hypoxia, COVID-19, coronary artery disease, tobacco abuse.  Patient of [name], seen for initial consult on 12/10/2020.  Previous [facility] pulmonary encounters: 12/10/20- [name], consult Referred to ILD center out of concern for post COVID pulmonary fibrosis and chronic hypoxemic respiratory failure  HPI Matthew [name] 55 y.o. -previously seen by Dr.  Annamaria Boots.  He was a smoker had pulmonary emphysema on a CT chest but had normal pulmonary function test in April 2021.  He quit smoking and he was discharged from follow-up.  He also had some hemoptysis over a year ago and the details are above.  He then was admitted between September 26, 2020 through November 16, 2020 4/50 days with COVID-19 pneumonia and acute respiratory failure.  According to him and his wife at one point he was on 100% oxygen.  Never intubated.  Unclear if he got BiPAP but probably not.  He slowly improved and at discharge was on 4 L nasal cannula at rest 8 L with exertion.  He is continue to require that.  He is now living ECOG 3-4 lifestyle.  When he takes a shower he gets easily winded.  In addition he has developed Covid related anxiety that developed in the hospital.  This is somewhat improved because he is less reliant on medications but still feels he wants medications asking me to prescribe but he is lost his primary care physician.  He is very physically deconditioned.  He is now unemployed.  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  Extreme levels of shortness of breath.  He did have elevated D-dimer but he had to CT angiogram chest that was negative for PE.  He had one Doppler that was negative for DVT.  His last chest x-ray 10/28/2020 showed diffuse bilateral infiltrates  01/01/2021 - Interim hx Patient was admitted in December for acute respiratory failure secondary to Covid infection. 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 No prior lung disease  except for pulmonary emphysema. During last visit he was still requiring 4 L oxygen at rest and 8 L oxygen on exertion. He was given prescription for prednisone taper over the next 2 weeks and started on Spiriva 2.64mcg. Needs physical therapy ideally but patient does not have insurance  Accompanied by his wife today. He feels great, has a lot more  energy. He tells me that he has been off oxygen during the day since march 5th. He used O2 for 5 days at night. He stopped using because he was taking it off in the middle of the nght. Since the 10th he has been off oxygen altogether. He is doing much more at home. He is walking without issue, previsisouly he was in a wheelchair. He has some chest congestion with a productive cough. Slight chest tightness but not all the time. His appetite is normal. He used spiriva two or three times without a noticeable difference in his breathing so he stopped using. Currently weaning off prednisone. He could tell a difference when tapering from 10mg  to 5mg , he has been more reliant on o2 since then. He does not currently have insurance since he is not working. He drives trucks for a living.    A/P 3/23: Respiratory failure ([place]) - Currently weaning oxygen needs - O2 desaturated to 82% RA after 1/2 lap, requiring 2-3L on exertion and at night  - Unable to attend pulmonary rehab d/t cost but he is working on increasing his activity at home  COVID-19 virus infection Prolonged hospital stay for Covid pneumonia/respiratory failure from December 2021-February 2022. 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 CXR on 12/10/20 showed resolving multilobular bilateral pneumonia. May need dedicated CT imaging to monitor for potential developing fibrosis.   He continues to improve clinically. He has been responsive to steriods. Weaning oxygen needs. Breathing a little more labored when tapering prednisone from 10mg  to 5mg . Recommend alternating 10/5mg  dose days for next week and then resume 5mg  until follow-up with [name]. Sending in [place] for minor bronchitis symptoms he is having and recommending mucinex twice daily.He stopped Spiriva d/t lack of noticeable difference in his breathing. PFTs were normal prior to covid dx. D-dimer trending down. Sed rate remains elevated.  Next office visit is scheduled for 01/13/21.   Recommendations: - Take 10mg  prednisone today; alternating with 5mg  every other day for x 1 week. Then taper back down to 5mg  daily until follow-up with [name] - Start mucinex 600mg  twice daily and take Zpack as prescribed for bronchitis  - Ok to stop Spiriva, we will remove from medication list  - Use albuterol rescue inhaler 2 puffs every 4-6 hours as needed for shortness of breath/wheezing       Observations/Objective: NAD.  A&Ox3  Assessment and Plan: 1. COVID-19 virus infection resolved  2. Acute respiratory failure with hypoxia (HCC) Much improved-continue f/up with pulmonology.  No O2 now.  Still on low dose prednisone  3. Depression, unspecified depression type lexapro 20mg  #9 take 1 daily with 1 RF  4. History of smoking Great work on quitting  5. Hospital discharge follow-up Doing well/steadily improving   Follow Up Instructions: Assign PCP here in 2-3 months   I discussed the assessment and treatment plan with the patient. The patient was provided an opportunity to ask questions and all were answered. The patient agreed with the plan and demonstrated an understanding of the instructions.   The patient was  advised to call back or seek an in-person evaluation if the symptoms worsen or if the condition fails to improve as anticipated.  I provided 17 minutes of non-face-to-face time during this encounter.   Freeman Caldron, PA-C

## 2021-01-13 ENCOUNTER — Ambulatory Visit (INDEPENDENT_AMBULATORY_CARE_PROVIDER_SITE_OTHER): Payer: Self-pay | Admitting: Internal Medicine

## 2021-01-13 ENCOUNTER — Telehealth: Payer: Self-pay | Admitting: Internal Medicine

## 2021-01-13 ENCOUNTER — Other Ambulatory Visit: Payer: Self-pay

## 2021-01-13 ENCOUNTER — Encounter: Payer: Self-pay | Admitting: Internal Medicine

## 2021-01-13 VITALS — BP 124/72 | HR 98 | Temp 97.0°F | Ht 71.0 in | Wt 214.0 lb

## 2021-01-13 DIAGNOSIS — U099 Post covid-19 condition, unspecified: Secondary | ICD-10-CM

## 2021-01-13 DIAGNOSIS — F19230 Other psychoactive substance dependence with withdrawal, uncomplicated: Secondary | ICD-10-CM

## 2021-01-13 DIAGNOSIS — R0609 Other forms of dyspnea: Secondary | ICD-10-CM

## 2021-01-13 DIAGNOSIS — J9611 Chronic respiratory failure with hypoxia: Secondary | ICD-10-CM

## 2021-01-13 DIAGNOSIS — Z8616 Personal history of COVID-19: Secondary | ICD-10-CM

## 2021-01-13 DIAGNOSIS — F19930 Other psychoactive substance use, unspecified with withdrawal, uncomplicated: Secondary | ICD-10-CM

## 2021-01-13 MED ORDER — PREDNISONE 5 MG PO TBEC: DELAYED_RELEASE_TABLET | ORAL | 0 refills | Status: DC

## 2021-01-13 MED ORDER — PREDNISONE 10 MG PO TABS: 10.0000 mg | ORAL_TABLET | Freq: Every day | ORAL | 0 refills | Status: DC

## 2021-01-13 MED FILL — Escitalopram Oxalate Tab 20 MG (Base Equiv): ORAL | 90 days supply | Qty: 90 | Fill #0 | Status: AC

## 2021-01-13 NOTE — Patient Instructions (Addendum)
    ICD-10-CM   1. Chronic respiratory failure with hypoxia (HCC)  J96.11   2. History of 2019 novel coronavirus disease (COVID-19)  Z86.16   3. COVID-19 long hauler manifesting chronic dyspnea  R06.09    U09.9   4. 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

## 2021-01-13 NOTE — Progress Notes (Signed)
12/04/19- 62 yom smoker ( 78 pack years) for hosp f/u after w/u for hemoptysis. He asked for me because I had cared for father who had lung cancer, as well as his sister and mother. Hosp 2/5-11/20/19 for large volume hemoptysis/ tranexamic acid inhalation Rx, acute respiratory failure/ intubation, abnormal CT chest, leukocytosis, mediastinal adenopathy Bronchoscopy- clot in LUL bronchus Needs repeat CT chest in 6 weeks= mid March. ASA was prescribed as 81 mg after stents years ago, but only taken occasionally.  . Taking only Protonix     Does not get flu vax. Medical hx CAD/ stents, tobacco abuse, hyperlipidemia, GERD,  Outpatient ENT/ Dr Benjamine Mola scoped Neg. Endoscopy- Neg  Hx childhood frequent epistaxis and cauterization. No hx of prior hemoptysis.  Wife says he had developed a harsh, deep cough in recent years. He has not smoked since 2/5 and both he and wife say he has almost completely stopped coughing. Did cough up some clots of old blood in first days after he got home. No wheeze, fever, adenopathy or other bleeding.  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  Rare ETOH without known liver disease. CT chest 11/17/19-  IMPRESSION: 1. There is dense consolidation of the lingula, and the lingular bronchus appears obstructed near the origin (series 7, image 70). This may be by clot, as reported on bronchoscopy. 2. There is some lucency within the lingular consolidation concerning for a developing cavitation (series 7, image 78). 3. Small bilateral pleural effusions and dependent bibasilar atelectasis or consolidation. 4. Enlarged pretracheal lymph nodes measuring up to 2.4 x 1.3 cm, nonspecific and perhaps reactive. 5.  Emphysema (ICD10-J43.9). 6. Small, brightly hyperenhancing foci in the left lobe of the liver and right lobe of the liver. These are nonspecific and could represent flash filling hemangiomas or transient perfusion artifacts. Consider multiphasic  contrast enhanced MRI to further evaluate on a nonacute basis when clinically appropriate. 7. Coronary artery disease.   02/12/20- Virtual Visit via Telephone Note  I connected with Matthew [name] on 02/12/20 at  3:15 PM EDT by telephone and verified that I am speaking with the correct person using two identifiers.  Location: Patient: O Provider: H   I discussed the limitations, risks, security and privacy concerns of performing an evaluation and management service by telephone and the availability of in person appointments. I also discussed with the patient that there may be a patient responsible charge related to this service. The patient expressed understanding and agreed to proceed.   History of Present Illness: 55 yom former smoker ( 78 pack years) for hosp f/u after w/u for / Epistaxis hemoptysis, complicated by hx epistaxis, CAD/ stents, tobacco abuse, hyperlipidemia, GERD,   Feeling much better now with only mild minor cough and no more bleeding.  Quit smoking Feb 5 !!- congratulated.  Observations/Objective: CXR 12/04/19- IMPRESSION: Nearly resolved bilateral heterogeneous airspace opacities. Previously seen bilateral heterogeneous airspace opacities are nearly resolved, in keeping with resolution of multifocal infection. No new airspace opacity.  PFT 02/09/20-  wnl !  Assessment and Plan: Hemoptysis- resolved Tobacco abuse- ended   Follow Up Instructions: Return PRN     OV 12/10/2020  Subjective:  Patient ID: Matthew [name], Matthew [name] , DOB: 1966/02/08 , age 55 y.o. , MRN: [id number] , ADDRESS: [place] [id number] PCP Patient, No Pcp Per Patient Care Team: Patient, No Pcp Per as PCP - General (General Practice)  This Provider for this visit: Treatment Team:  Attending  Provider: Brand Males, MD    12/10/2020 -   Chief Complaint  Patient presents with  . Consult   Referred to ILD center out of concern for post COVID  pulmonary fibrosis and chronic hypoxemic respiratory failure  HPI Matthew [name] 55 y.o. -previously seen by Dr. Annamaria Boots.  He was a smoker had pulmonary emphysema on a CT chest but had normal pulmonary function test in April 2021.  He quit smoking and he was discharged from follow-up.  He also had some hemoptysis over a year ago and the details are above.  He then was admitted between September 26, 2020 through November 16, 2020 4/50 days with COVID-19 pneumonia and acute respiratory failure.  According to him and his wife at one point he was on 100% oxygen.  Never intubated.  Unclear if he got BiPAP but probably not.  He slowly improved and at discharge was on 4 L nasal cannula at rest 8 L with exertion.  He is continue to require that.  He is now living ECOG 3-4 lifestyle.  When he takes a shower he gets easily winded.  In addition he has developed Covid related anxiety that developed in the hospital.  This is somewhat improved because he is less reliant on medications but still feels he wants medications asking me to prescribe but he is lost his primary care physician.  He is very physically deconditioned.  He is now unemployed.  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  Extreme levels of shortness of breath.  He did have elevated D-dimer but he had to CT angiogram chest that was negative for PE.  He had one Doppler that was negative for DVT.  His last chest x-ray 10/28/2020 showed diffuse bilateral infiltrates  His current symptom status below  SYMPTOM SCALE - ILD 12/10/2020   O2 use  4 L nasal cannula at rest 8 L with exertion  Shortness of Breath 0 -> 5 scale with 5 being worst (score 6 If unable to do)  At rest 0  Simple tasks - showers, clothes change, eating, shaving 3  Household (dishes, doing bed, laundry) 6  Shopping 6  Walking level at own pace 6  Walking up Stairs 6  Total (30-36) Dyspnea Score 27  How bad is your cough? 3   How bad is your fatigue yes  How bad is nausea 0  How bad is vomiting?  0  How bad is diarrhea? 0  How bad is anxiety? At time  How bad is depression 0       We did a ILD questionnaire on him  -Past medical: Denies any collagen vascular disease.  Denies any COPD but he does have emphysema on his previous imaging according to Dr. Annamaria Boots.  Denies vasculitis problems.  Denies diabetes or thyroid disease or previous stroke.  Denies hepatitis.  Denies tuberculosis.  Denies kidney disease denies pneumonia denies blood clots.  He did have coronary artery disease and stents in 2010  Review of systems: He has lost 60 pounds after Covid.  He is got a lot of fatigue.  No nausea no vomiting no rash no ulcers  Family history of pulmonary diseases: Denies  Personal exposure history: Started smoking as a teenager and stopped smoking in 2021.  Minimal 1 pack a day.  No pipe smoking.  No marijuana no cocaine no intravenous drug use  Home and hobby details: [place] year [place] home.  He did do some mowing and yard work but no organic antigen exposures at home  Occupational history for organic and inorganic antigens completely negative  Pulmonary toxicity history: Negative      PFT  PFT Results Latest Ref Rng & Units 02/09/2020  FVC-Pre L 4.39  FVC-Predicted Pre % 86  FVC-Post L 4.23  FVC-Predicted Post % 83  Pre FEV1/FVC % % 78  Post FEV1/FCV % % 81  FEV1-Pre L 3.42  FEV1-Predicted Pre % 87  FEV1-Post L 3.42  DLCO uncorrected ml/min/mmHg 26.04  DLCO UNC% % 88  DLCO corrected ml/min/mmHg 26.04  DLCO COR %Predicted % 88  DLVA Predicted % 101  TLC L 6.35  TLC % Predicted % 88  RV % Predicted % 90     01/01/2021 - Interim hx Patient was admitted in December for acute respiratory failure secondary to Covid infection. 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 No prior lung disease except for pulmonary  emphysema. During last visit he was still requiring 4 L oxygen at rest and 8 L oxygen on exertion. He was given prescription for prednisone taper over the next 2 weeks and started on Spiriva 2.83mcg. Needs physical therapy ideally but patient does not have insurance  Accompanied by his wife today. He feels great, has a lot more energy. He tells me that he has been off oxygen during the day since march 5th. He used O2 for 5 days at night. He stopped using because he was taking it off in the middle of the nght. Since the 10th he has been off oxygen altogether. He is doing much more at home. He is walking without issue, previsisouly he was in a wheelchair. He has some chest congestion with a productive cough. Slight chest tightness but not all the time. His appetite is normal. He used spiriva two or three times without a noticeable difference in his breathing so he stopped using. Currently weaning off prednisone. He could tell a difference when tapering from $RemoveBefor'10mg'zFgjIssjpkKE$  to $R'5mg'Fs$ , he has been more reliant on o2 since then. He does not currently have insurance since he is not working. He drives trucks for a living.    12/10/20 Labs: Dimer 0.77 Sed rate elevated at 67 BNP 70 WBC 14.1, baseline since January (on oral steriods) Quantiferon TB Gold negative  Observations/Objective: CXR 12/04/19- IMPRESSION: Nearly resolved bilateral heterogeneous airspace opacities. Previously seen bilateral heterogeneous airspace opacities are nearly resolved, in keeping with resolution of multifocal infection. No new airspace opacity.    OV 01/13/2021  Subjective:  Patient ID: Matthew [name], Matthew [name] , DOB: 07-Jan-1966 , age 55 y.o. , MRN: [id number] , ADDRESS: [place] [id number] PCP Patient, No Pcp Per (Inactive) Patient Care Team: Patient, No Pcp Per (Inactive) as PCP - General (General Practice)  This Provider for this visit: Treatment Team:  Attending Provider: Brand Males,  MD    01/13/2021 -   Chief Complaint  Patient presents with  . Follow-up    Pt states he has been doing better since last visit. Uses 2-3L with exertion and also wears 2L at night.      HPI Matthew [name] 55 y.o. -returns for follow-up with his wife.  At last visit we put him on prednisone for post COVID hypoxemic respiratory failure with ILD changes.  After that he started improving significantly.  At this point in time after starting prednisone on  December 10, 2020 he is normal on room air at rest.  He desaturated only after walking 90 feet.  He is using 2 L of oxygen at night and with exertion.  He is doing the lawnmower at home.  He wants to go back to work.  His functional status is significantly improved.  However he is asking for handicap placard.  His wife is advising caution before he goes back to driving truck.  He says if he drives a truck he cannot use oxygen.  Total exertion in and out of the truck is around 10 to 20 feet.  However he is willing to wait a little bit.  He has gained weight from prednisone.  He says when he tried to taper the prednisone to 5 mg/day he started feeling "blah"./It was not a respiratory decompensation.  He is grateful for the care he has received and is appreciative that he is getting better.   SYMPTOM SCALE - ILD 12/10/2020  01/13/2021   O2 use  4 L nasal cannula at rest 8 L with exertion ra at rest, 2L with exertion and night  Shortness of Breath 0 -> 5 scale with 5 being worst (score 6 If unable to do)   At rest 0   Simple tasks - showers, clothes change, eating, shaving 3   Household (dishes, doing bed, laundry) 6   Shopping 6   Walking level at own pace 6   Walking up Stairs 6   Total (30-36) Dyspnea Score 27   How bad is your cough? 3   How bad is your fatigue yes   How bad is nausea 0   How bad is vomiting?  0   How bad is diarrhea? 0   How bad is anxiety? At time   How bad is depression 0     . Simple office walk 185 feet x  3 laps  goal with forehead probe 01/13/2021   O2 used ra  Number laps completed Only half of 3 laps  Comments about pace Nl pac3  Resting Pulse Ox/HR 92% and 93*/min  Final Pulse Ox/HR 85% and 115/min  Desaturated </= 88% Yes at half lap  Desaturated <= 3% points yes  Got Tachycardic >/= 90/min yes  Symptoms at end of test No dyspnea during desat  Miscellaneous comments Corrected wth 2L    CXR 3/122   IMPRESSION: 1. The appearance of the lungs is compatible with resolving multilobar bilateral pneumonia no likely with developing post infectious fibrosis. These findings could be better evaluated with follow-up nonemergent high-resolution chest CT if clinically appropriate.   Electronically Signed   By: Vinnie Langton M.D.   On: 12/11/2020 09:08   PFT  PFT Results Latest Ref Rng & Units 02/09/2020  FVC-Pre L 4.39  FVC-Predicted Pre % 86  FVC-Post L 4.23  FVC-Predicted Post % 83  Pre FEV1/FVC % % 78  Post FEV1/FCV % % 81  FEV1-Pre L 3.42  FEV1-Predicted Pre % 87  FEV1-Post L 3.42  DLCO uncorrected ml/min/mmHg 26.04  DLCO UNC% % 88  DLCO corrected ml/min/mmHg 26.04  DLCO COR %Predicted % 88  DLVA Predicted % 101  TLC L 6.35  TLC % Predicted % 88  RV % Predicted % 90       has a past medical history of Coronary artery disease (per pt last cardiology visit 2011 (found documentation 12-05-2009 w/ dr Leonia Reeves with [facility] in epic)/  currently pt is followed by pcp), GERD (gastroesophageal reflux disease),  Hyperlipidemia, Olecranon bursitis of left elbow, Respiratory failure (HCC), S/P right coronary artery (RCA) stent placement (12/28/2008), and Wears dentures.   reports that he quit smoking about 13 months ago. His smoking use included cigarettes. He has a 78.00 pack-year smoking history. He has never used smokeless tobacco.  Past Surgical History:  Procedure Laterality Date  . CARDIAC CATHETERIZATION  12-09-2009  dr Ilda Foil   abnormal stress test (12-05-2009) widely patent  coronaries present w/ evidence of diffuse atherosclerotic disease/  widely patent stented segments in the proximal and mid segment RCA/  LVEF 65%  . CORONARY ANGIOPLASTY WITH STENT PLACEMENT  12-28-2008  dr berry   PCI and stenting of the proxmial and mid RCA using cutting balloon atherectomy (x2 DES, Xience)/  LVEF >60% w/ normal wall motion  . ESOPHAGOGASTRODUODENOSCOPY N/A 11/17/2019   Procedure: ESOPHAGOGASTRODUODENOSCOPY (EGD);  Surgeon: Carol Ada, MD;  Location: [facility];  Service: Gastroenterology;  Laterality: N/A;  . KNEE ARTHROSCOPY W/ MENISCECTOMY Right 2002  . NEUROPLASTY / TRANSPOSITION ULNAR NERVE AT ELBOW Right 10/2014   dr Caralyn Guile  . OLECRANON BURSECTOMY Left 05/06/2017   Procedure: LEFT ELBOW OLECRANON BURSA EXCISION;  Surgeon: Iran Planas, MD;  Location: [facility];  Service: Orthopedics;  Laterality: Left;  . ORIF RIGHT ANKLE FX  2006  . TONSILLECTOMY  age 83  . TRANSTHORACIC ECHOCARDIOGRAM  03/30/2016   mild concentric LVH, ef 50-55%/  trivial TR    Allergies  Allergen Reactions  . Bee Venom   . Codeine Nausea And Vomiting  . Onion Swelling    "thoart swells up"    Immunization History  Administered Date(s) Administered  . DTaP 01/07/2015  . PFIZER(Purple Top)SARS-COV-2 Vaccination 12/25/2020  . Pneumococcal Polysaccharide-23 11/16/2020    Family History  Problem Relation Age of Onset  . Cancer Mother   . Cancer Father      Current Outpatient Medications:  .  Ascorbic Acid (VITAMIN C) 1000 MG tablet, Take 1,000 mg by mouth daily., Disp: , Rfl:  .  benzonatate (TESSALON) 200 MG capsule, TAKE 1 CAPSULE (200 MG TOTAL) BY MOUTH THREE TIMES DAILY AS NEEDED FOR COUGH., Disp: 20 capsule, Rfl: 0 .  escitalopram (LEXAPRO) 20 MG tablet, TAKE 1 TABLET (20 MG TOTAL) BY MOUTH DAILY., Disp: 90 tablet, Rfl: 1 .  Multiple Vitamins-Minerals (CERTAVITE/ANTIOXIDANTS) TABS, TAKE 1 TABLET BY MOUTH DAILY., Disp: 30 tablet, Rfl: 0 .  predniSONE (DELTASONE)  5 MG tablet, Take 1 tablet (5 mg total) by mouth daily with breakfast. Take 1-2 tabs daily as instructed until follow-up, Disp: 21 tablet, Rfl: 0 .  vitamin B-12 (CYANOCOBALAMIN) 1000 MCG tablet, Take 5,000 mcg by mouth daily., Disp: , Rfl:  .  Vitamin D, Cholecalciferol, 10 MCG (400 UNIT) CAPS, Take 400 Units by mouth daily., Disp: , Rfl:  .  vitamin E 1000 UNIT capsule, Take 1,000 Units by mouth daily., Disp: , Rfl:  .  albuterol (VENTOLIN HFA) 108 (90 Base) MCG/ACT inhaler, INHALE 2 PUFFS INTO THE LUNGS EVERY SIX HOURS AS NEEDED FOR WHEEZING OR SHORTNESS OF BREATH. (Patient not taking: No sig reported), Disp: 18 g, Rfl: 2 .  clonazePAM (KLONOPIN) 0.5 MG tablet, TAKE 1 TABLET (0.5 MG TOTAL) BY MOUTH DAILY AS NEEDED (ANXIETY). (Patient not taking: Reported on 01/13/2021), Disp: 30 tablet, Rfl: 1 .  pantoprazole (PROTONIX) 40 MG tablet, TAKE 1 TABLET (40 MG TOTAL) BY MOUTH DAILY. (Patient not taking: Reported on 01/13/2021), Disp: 30 tablet, Rfl: 0      Objective:   Vitals:  01/13/21 1503  BP: 124/72  Pulse: 98  Temp: (!) 97 F (36.1 C)  TempSrc: Temporal  SpO2: 92%  Weight: 214 lb (97.1 kg)  Height: 5\' 11"  (1.803 m)    Estimated body mass index is 29.85 kg/m as calculated from the following:   Height as of this encounter: 5\' 11"  (1.803 m).   Weight as of this encounter: 214 lb (97.1 kg).  @WEIGHTCHANGE @  [employer]   01/13/21 1503  Weight: 214 lb (97.1 kg)     Physical Exam  General: No distress. Looks better. More cushingoid Neuro: Alert and Oriented x 3. GCS 15. Speech normal Psych: Pleasant Resp:  Barrel Chest - no.  Wheeze - no, Crackles - no, No overt respiratory distress CVS: Normal heart sounds. Murmurs - no Ext: Stigmata of Connective Tissue Disease - no HEENT: Normal upper airway. PEERL +. No post nasal drip        Assessment:       ICD-10-CM   1. Chronic respiratory failure with hypoxia (HCC)  J96.11   2. History of 2019 novel coronavirus disease  (COVID-19)  Z86.16   3. COVID-19 long hauler manifesting chronic dyspnea  R06.09    U09.9   4. 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 Chronic respiratory failure with hypoxia (HCC)  J96.11   2. History of 2019 novel coronavirus disease (COVID-19)  Z86.16   3. COVID-19 long hauler manifesting chronic dyspnea  R06.09    U09.9   4. 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 This includes one or more of the following on this same day 01/13/2021: pre-charting, chart review, note writing, documentation discussion of test results, diagnostic or treatment recommendations, prognosis, risks and benefits of management options, instructions, education, compliance or risk-factor reduction. It excludes time spent by the [place] or office staff in the care of the patient . Actual time is 32 min)   SIGNATURE    Dr.  Brand Males, M.D., F.C.C.P,  Pulmonary and Critical Care Medicine Staff Physician, [place] Director - Interstitial Lung Disease  Program  Pulmonary [place] [facility] [place], [place], [address]  Pager: [phone], If no answer or between  15:00h - 7:00h: call  [phone]  [phone] Telephone: [phone]  3:40 PM 01/13/2021

## 2021-01-13 NOTE — Telephone Encounter (Signed)
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  meds for the prednisone tapers have been sent to the pharmacy for the pt.

## 2021-02-20 ENCOUNTER — Ambulatory Visit (INDEPENDENT_AMBULATORY_CARE_PROVIDER_SITE_OTHER): Payer: Self-pay | Admitting: Internal Medicine

## 2021-02-20 ENCOUNTER — Other Ambulatory Visit: Payer: Self-pay

## 2021-02-20 ENCOUNTER — Encounter: Payer: Self-pay | Admitting: *Deleted

## 2021-02-20 ENCOUNTER — Encounter: Payer: Self-pay | Admitting: Internal Medicine

## 2021-02-20 VITALS — BP 134/88 | HR 84 | Temp 97.9°F | Ht 71.0 in | Wt 226.8 lb

## 2021-02-20 DIAGNOSIS — Z8616 Personal history of COVID-19: Secondary | ICD-10-CM

## 2021-02-20 DIAGNOSIS — J439 Emphysema, unspecified: Secondary | ICD-10-CM

## 2021-02-20 DIAGNOSIS — J9611 Chronic respiratory failure with hypoxia: Secondary | ICD-10-CM
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
## 2021-02-20 NOTE — Patient Instructions (Addendum)
ICD-10-CM   1. History of 2019 novel coronavirus disease (COVID-19)  Z86.16   2. Chronic respiratory failure with hypoxia (HCC)  J96.11   3. 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 for follow-up [15-minute slot]  - chck alpah 1 at followup

## 2021-03-24 ENCOUNTER — Other Ambulatory Visit: Payer: Self-pay | Admitting: *Deleted

## 2021-03-24 ENCOUNTER — Telehealth: Payer: Self-pay | Admitting: Internal Medicine

## 2021-03-24 DIAGNOSIS — Z8616 Personal history of COVID-19: Secondary | ICD-10-CM

## 2021-03-24 MED ORDER — AZITHROMYCIN 250 MG PO TABS: ORAL_TABLET | ORAL | 0 refills | Status: DC

## 2021-03-24 MED ORDER — PREDNISONE 10 MG PO TABS: ORAL_TABLET | ORAL | 0 refills | Status: AC

## 2021-03-24 NOTE — Telephone Encounter (Signed)
Spoke with the pt He is c/o PND and cough with green sputum over the past few days  He states that his sinuses have felt very congested- taking claritin and alkaseltzer cold as needed and this helps some  He denies any sore throat, HA, f/c/s, aches, SOB, wheezing  Please advise thanks! He has had covid vaccine x 2  Please advise thank you  Allergies  Allergen Reactions   Bee Venom    Codeine Nausea And Vomiting   Onion Swelling    "thoart swells up"

## 2021-03-24 NOTE — Telephone Encounter (Signed)
He has had previous COVID but he should definitely check a COVID antigen test.  If this is positive then we will arrange for antiviral or monoclonal antibody.  If it is negative he should go get a PCR test  In addition he should Please take prednisone 40 mg x1 day, then 30 mg x1 day, then 20 mg x1 day, then 10 mg x1 day, and then 5 mg x1 day and stop  Z pak

## 2021-03-24 NOTE — Telephone Encounter (Signed)
Called and spoke with patient, advised of recommendations per MR.  D/t the high risk of our patients in the office, I advised him to do a home test and let us know if it comes back positive prior to coming to have his lab work drawn.  He verified understanding.  Medications sent to [facility] as requested and COVID antigen lab ordered.

## 2021-04-05 MED FILL — Escitalopram Oxalate Tab 20 MG (Base Equiv): ORAL | 30 days supply | Qty: 30 | Fill #1 | Status: AC

## 2021-04-07 ENCOUNTER — Other Ambulatory Visit: Payer: Self-pay

## 2021-04-07 MED FILL — Escitalopram Oxalate Tab 20 MG (Base Equiv): ORAL | 60 days supply | Qty: 60 | Fill #2 | Status: AC

## 2021-04-24 ENCOUNTER — Encounter: Payer: Self-pay | Admitting: Internal Medicine

## 2021-04-24 ENCOUNTER — Encounter (INDEPENDENT_AMBULATORY_CARE_PROVIDER_SITE_OTHER): Payer: Self-pay

## 2021-04-24 ENCOUNTER — Ambulatory Visit: Payer: Self-pay | Attending: Internal Medicine | Admitting: Internal Medicine

## 2021-04-24 ENCOUNTER — Other Ambulatory Visit: Payer: Self-pay

## 2021-04-24 VITALS — BP 137/85 | HR 81 | Resp 16 | Ht 71.0 in | Wt 237.6 lb

## 2021-04-24 DIAGNOSIS — E66811 Obesity, class 1: Secondary | ICD-10-CM | POA: Insufficient documentation

## 2021-04-24 DIAGNOSIS — Z2821 Immunization not carried out because of patient refusal: Secondary | ICD-10-CM

## 2021-04-24 DIAGNOSIS — R4586 Emotional lability: Secondary | ICD-10-CM | POA: Insufficient documentation

## 2021-04-24 DIAGNOSIS — Z9981 Dependence on supplemental oxygen: Secondary | ICD-10-CM

## 2021-04-24 DIAGNOSIS — J9611 Chronic respiratory failure with hypoxia: Secondary | ICD-10-CM

## 2021-04-24 DIAGNOSIS — Z7689 Persons encountering health services in other specified circumstances: Secondary | ICD-10-CM

## 2021-04-24 DIAGNOSIS — E669 Obesity, unspecified: Secondary | ICD-10-CM | POA: Insufficient documentation

## 2021-04-24 DIAGNOSIS — R030 Elevated blood-pressure reading, without diagnosis of hypertension: Secondary | ICD-10-CM | POA: Insufficient documentation

## 2021-04-24 DIAGNOSIS — I2510 Atherosclerotic heart disease of native coronary artery without angina pectoris: Secondary | ICD-10-CM

## 2021-04-24 NOTE — Progress Notes (Signed)
Patient ID: Matthew [name], male    DOB: [birth year]  MRN: [id number]  CC: Establish Care   Subjective: Matthew [name] is a 55 y.o. male who presents for new pt visit His concerns today include:  Patient with history of CAD with 2 stent,  HL, former smoker ( quit 11/2019), emphysema on CT with normal PFTs 01/2020, COVID pneumonia with resulting chronic hypoxia req home O2 (09/2020), hemoptysis.  Patient is followed by pulmonologist [name].   Previous PCP was Dr. Trilby Drummer.  He is not sure what happened to Dr. Alyson Ingles.  I think he is no longer in practice. Here to est care  Patient with history of COVID-pneumonia requiring hospitalization in December of last year through February of this year.  He was sent home on O2.  Since then he has been followed by [name].  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   Uses O2 at nights but not every night.  Not using O2 during the day at all.  Checking O2 with ambulation; range 90-97.  At nights 91-95% No SOB with house chores; SOB if he does heavy lifting Not having to use albuterol inhaler at all.  CAD:  doing great.  I note that he is not on aspirin or statin therapy.  Patient states aspirin causes him to bruise and bleed easily.  Reports being on statin therapy in the past but states he was taken off of it and that his cholesterol is fine.  He tells me that he takes a shot of vinegar daily and this keeps his cholesterol down. -He is obese for height based on his BMI.  He is gained 23 pounds since April of this year.  He endorses that his weight has increased.  States that it is too expensive to eat as healthy as he would like.  He drinks sodas in particular Dr Malachi Bonds and sweet tea. -Blood pressure noted to be elevated today.  He tells me that he has never had an issue with his blood pressure.  On looking at his flowsheet in the system, blood pressure in May when he saw his  pulmonologist was 134/88, in April it was 124/72 and in March 1 32/76.  He limits salt in his foods ever since he had COVID infection.  I note that he is on Lexapro.  He tells me that he was initially placed on it for anxiety after having COVID.  However he states his anxiety has resolved and he is taking it more so "to prevent me from going off."  When asked if it helps with mood swings he states yes and feels that he is stable on it.  HM: Due for shingles vaccine and tetanus vaccine.  He tells me that he has had a colonoscopy back in 2017 but does not recall the name of the gastroenterologist who did it.  He knows where the office is located and states he will pass there today.  Family history, surgical history, social history reviewed.  Patient Active Problem List   Diagnosis Date Noted   Acute respiratory failure with hypoxia ([place]) 09/26/2020   CAD (coronary artery disease) 09/26/2020   COVID-19 virus infection 09/26/2020   Tobacco abuse 12/04/2019   Respiratory failure ([place])    Hemoptysis 11/17/2019     Current Outpatient Medications on File Prior to Visit  Medication Sig Dispense Refill   Ascorbic Acid (VITAMIN C) 1000 MG  tablet Take 1,000 mg by mouth daily.     escitalopram (LEXAPRO) 20 MG tablet TAKE 1 TABLET (20 MG TOTAL) BY MOUTH DAILY. 90 tablet 1   vitamin B-12 (CYANOCOBALAMIN) 1000 MCG tablet Take 5,000 mcg by mouth daily.     Vitamin D, Cholecalciferol, 10 MCG (400 UNIT) CAPS Take 400 Units by mouth daily.     vitamin E 1000 UNIT capsule Take 1,000 Units by mouth daily.     albuterol (VENTOLIN HFA) 108 (90 Base) MCG/ACT inhaler INHALE 2 PUFFS INTO THE LUNGS EVERY SIX HOURS AS NEEDED FOR WHEEZING OR SHORTNESS OF BREATH. (Patient not taking: Reported on 04/24/2021) 18 g 2   Multiple Vitamins-Minerals (CERTAVITE/ANTIOXIDANTS) TABS TAKE 1 TABLET BY MOUTH DAILY. (Patient not taking: Reported on 04/24/2021) 30 tablet 0   No current facility-administered medications on file prior to  visit.    Allergies  Allergen Reactions   Bee Venom    Codeine Nausea And Vomiting   Onion Swelling    "thoart swells up"    Social History   Socioeconomic History   Marital status: Married    Spouse name: Not on file   Number of children: Not on file   Years of education: Not on file   Highest education level: Not on file  Occupational History   Not on file  Tobacco Use   Smoking status: Former    Packs/day: 2.00    Years: 39.00    Pack years: 78.00    Types: Cigarettes    Quit date: 11/17/2019    Years since quitting: 1.4   Smokeless tobacco: Never   Tobacco comments:    since age 17  Vaping Use   Vaping Use: Never used  Substance and Sexual Activity   Alcohol use: Yes    Comment: seldom   Drug use: No   Sexual activity: Yes  Other Topics Concern   Not on file  Social History Narrative   Not on file   Social Determinants of Health   Financial Resource Strain: Not on file  Food Insecurity: Not on file  Transportation Needs: Not on file  Physical Activity: Not on file  Stress: Not on file  Social Connections: Not on file  Intimate Partner Violence: Not on file    Family History  Problem Relation Age of Onset   Cancer Mother    Cancer Father     Past Surgical History:  Procedure Laterality Date   CARDIAC CATHETERIZATION  12-09-2009  dr Ilda Foil   abnormal stress test (12-05-2009) widely patent coronaries present w/ evidence of diffuse atherosclerotic disease/  widely patent stented segments in the proximal and mid segment RCA/  LVEF 65%   CORONARY ANGIOPLASTY WITH STENT PLACEMENT  12-28-2008  dr berry   PCI and stenting of the proxmial and mid RCA using cutting balloon atherectomy (x2 DES, Xience)/  LVEF >60% w/ normal wall motion   ESOPHAGOGASTRODUODENOSCOPY N/A 11/17/2019   Procedure: ESOPHAGOGASTRODUODENOSCOPY (EGD);  Surgeon: Carol Ada, MD;  Location: [facility];  Service: Gastroenterology;  Laterality: N/A;   KNEE ARTHROSCOPY W/ MENISCECTOMY  Right 2002   NEUROPLASTY / TRANSPOSITION ULNAR NERVE AT ELBOW Right 10/2014   dr Bobette Mo BURSECTOMY Left 05/06/2017   Procedure: LEFT ELBOW OLECRANON BURSA EXCISION;  Surgeon: Iran Planas, MD;  Location: [facility];  Service: Orthopedics;  Laterality: Left;   ORIF RIGHT ANKLE FX  2006   TONSILLECTOMY  age 9   TRANSTHORACIC ECHOCARDIOGRAM  03/30/2016   mild concentric LVH,  ef 50-55%/  trivial TR    ROS: Review of Systems Negative except as stated above  PHYSICAL EXAM: BP 137/85   Pulse 81   Resp 16   Ht 5\' 11"  (1.803 m)   Wt 237 lb 9.6 oz (107.8 kg)   SpO2 95%   BMI 33.14 kg/m   Wt Readings from Last 3 Encounters:  04/24/21 237 lb 9.6 oz (107.8 kg)  02/20/21 226 lb 12.8 oz (102.9 kg)  01/13/21 214 lb (97.1 kg)    Physical Exam Repeat blood pressure 130/88 right upper extremity, 132/88 left upper extremity  General appearance - alert, well appearing, middle-aged older Caucasian male and in no distress Mental status -patient got upset when I asked him to put his mask on properly instead of wearing it below his mouth.  He barely placed it above his mouth but below the nose. Eyes - pupils equal and reactive, extraocular eye movements intact Neck - supple, no significant adenopathy Chest - clear to auscultation, no wheezes, rales or rhonchi, symmetric air entry Heart - normal rate, regular rhythm, normal S1, S2, no murmurs, rubs, clicks or gallops Extremities - peripheral pulses normal, no pedal edema, no clubbing or cyanosis  CMP Latest Ref Rng & Units 12/10/2020 12/10/2020 11/09/2020  Glucose 70 - 99 mg/dL 117(H) - 118(H)  BUN 6 - 23 mg/dL 10 - 12  Creatinine 0.40 - 1.50 mg/dL 0.74 - 0.91  Sodium 135 - 145 mEq/L 134(L) - 136  Potassium 3.5 - 5.1 mEq/L 4.2 - 4.1  Chloride 96 - 112 mEq/L 99 - 98  CO2 19 - 32 mEq/L 26 - 28  Calcium 8.4 - 10.5 mg/dL 10.1 - 8.6(L)  Total Protein 6.0 - 8.3 g/dL 8.5(H) 8.5(H) 7.9  Total Bilirubin 0.2 - 1.2 mg/dL 0.5 0.5  0.8  Alkaline Phos 39 - 117 U/L 107 107 102  AST 0 - 37 U/L 28 28 20   ALT 0 - 53 U/L 36 36 23   Lipid Panel     Component Value Date/Time   TRIG 197 (H) 09/27/2020 0023    CBC    Component Value Date/Time   WBC 15.3 (H) 01/01/2021 1048   RBC 4.12 (L) 01/01/2021 1048   HGB 13.0 01/01/2021 1048   HCT 38.4 (L) 01/01/2021 1048   PLT 190.0 01/01/2021 1048   MCV 93.3 01/01/2021 1048   MCH 30.3 11/09/2020 0452   MCHC 33.8 01/01/2021 1048   RDW 15.9 (H) 01/01/2021 1048   LYMPHSABS 1.8 12/10/2020 1145   MONOABS 0.9 12/10/2020 1145   EOSABS 0.1 12/10/2020 1145   BASOSABS 0.1 12/10/2020 1145    ASSESSMENT AND PLAN: 1. Encounter to establish care Patient's medical records reviewed.  2. Coronary artery disease involving native coronary artery of native heart without angina pectoris Stable. Advised that he should be on statin therapy even if cholesterol is okay given his history of heart disease.  He declined stating he does not like taking a lot of medications and he is not going to take it.  He also declines taking a baby aspirin daily. He declines baseline blood tests today that would have included chemistry, CBC and lipid profile.  3. Elevated blood pressure reading DASH diet discussed and encouraged.  He does not wish to follow-up with clinical pharmacist for blood pressure recheck.  4. Mood swings Reports he is stable on Lexapro  5. Obesity (BMI 30.0-34.9) Discussed and encourage healthy eating habits.  Advised to eliminate sugary drinks from the diet, cut back on  portion sizes of white carbohydrates, eat more lean white meat instead of red meat and incorporate fresh fruits and vegetables into the diet.  Regular exercise encouraged.  6. Chronic respiratory failure with hypoxia, on home oxygen therapy [facility]) Patient has become less dependent on O2 and is doing much better.  7. Tetanus, diphtheria, and acellular pertussis (Tdap) vaccination declined. Patient declines tetanus  vaccine.  He also declines shingles vaccine.  Advised once he figures out who did his colonoscopy, he can come back and sign a release for [place] to get a copy of it for his records.  Patient was given the opportunity to ask questions.  Patient verbalized understanding of the plan and was able to repeat key elements of the plan.   I spent 35 minutes in the overall care of this patient today including review of his records, face-to-face time with the patient getting his history, doing his physical and discussing assessment and plan. No orders of the defined types were placed in this encounter.    Requested Prescriptions    No prescriptions requested or ordered in this encounter    Return in about 6 months (around 10/25/2021).  Karle Plumber, MD, FACP

## 2021-04-24 NOTE — Patient Instructions (Signed)

## 2021-05-30 ENCOUNTER — Encounter: Payer: Self-pay | Admitting: Internal Medicine

## 2021-05-30 ENCOUNTER — Other Ambulatory Visit: Payer: Self-pay

## 2021-05-30 ENCOUNTER — Ambulatory Visit (INDEPENDENT_AMBULATORY_CARE_PROVIDER_SITE_OTHER): Payer: Self-pay | Admitting: Internal Medicine

## 2021-05-30 VITALS — BP 130/84 | HR 68 | Temp 97.3°F | Ht 71.0 in | Wt 242.0 lb

## 2021-05-30 DIAGNOSIS — U099 Post covid-19 condition, unspecified: Secondary | ICD-10-CM

## 2021-05-30 DIAGNOSIS — J8410 Pulmonary fibrosis, unspecified: Secondary | ICD-10-CM

## 2021-05-30 DIAGNOSIS — Z87891 Personal history of nicotine dependence: Secondary | ICD-10-CM

## 2021-05-30 DIAGNOSIS — J439 Emphysema, unspecified: Secondary | ICD-10-CM

## 2021-05-30 DIAGNOSIS — R0609 Other forms of dyspnea: Secondary | ICD-10-CM

## 2021-05-30 DIAGNOSIS — R0902 Hypoxemia: Secondary | ICD-10-CM
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
## 2021-05-30 NOTE — Patient Instructions (Addendum)
ICD-10-CM   1. COVID-19 long hauler manifesting chronic dyspnea  R06.09    U09.9     2. Pulmonary emphysema, unspecified emphysema type ([place])  J43.9     3. 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 Stopped smoking with less than 1 pack per day history  Z87.891     5. 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 in a 15-minute slot in 6 months

## 2021-06-02 ENCOUNTER — Telehealth: Payer: Self-pay | Admitting: Internal Medicine

## 2021-06-02 NOTE — Telephone Encounter (Signed)
This is the response from [employer] for the O2 DME order:  Since the RX is for continuous o2, I will need SATS.              Room air at rest           Room air while ambulating           Recovery  Thank you so much!

## 2021-06-02 NOTE — Telephone Encounter (Signed)
I was not in office when pt saw MR on 05/30/21.  Routing to Amy who was working with MR that day. Please advise.

## 2021-06-04 NOTE — Telephone Encounter (Signed)
I think Charisse March did that walk for the patient so I will ask her tomm for the numbers. thanks

## 2021-06-17 NOTE — Telephone Encounter (Signed)
Hey there, do you happen to remember or have wrote down those numbers? I believed you walked this patient. Thanks!

## 2021-07-03 ENCOUNTER — Other Ambulatory Visit: Payer: Self-pay | Admitting: Physician Assistant

## 2021-07-03 ENCOUNTER — Other Ambulatory Visit: Payer: Self-pay

## 2021-07-03 MED ORDER — ESCITALOPRAM OXALATE 20 MG PO TABS
20.0000 mg | ORAL_TABLET | Freq: Every day | ORAL | 1 refills | Status: DC
  Filled 2021-07-03: qty 90, 90d supply, fill #0
  Filled 2021-07-03: qty 30, 30d supply, fill #0
  Filled 2021-09-29: qty 90, 90d supply, fill #1

## 2021-07-07 ENCOUNTER — Telehealth: Payer: Self-pay | Admitting: Internal Medicine

## 2021-07-07 NOTE — Telephone Encounter (Signed)
I called and spoke with patient regarding appt. It looks like they were calling to get patient re-certified for O2. Patient stated he is no longer wearing the oxygen during the day but wears it at night every now and then. He stated he will wear it if [name] wants him to. Will route to [name] for recs.  [name], please advise on oxygen. Thanks!

## 2021-07-07 NOTE — Telephone Encounter (Signed)
Pt will need ov with provider and qualifying o2 sats. It has moore over 30 days since last visit and numbers during walk then were never documented. LMTCB for pt.

## 2021-07-09 NOTE — Telephone Encounter (Signed)
Definitely recommend flu shot as long as no contraindication   Allergies  Allergen Reactions   Bee Venom    Codeine Nausea And Vomiting   Onion Swelling    "thoart swells up"

## 2021-07-09 NOTE — Telephone Encounter (Signed)
I have called the pt and he stated that he would decide about the oxygen.    He did want to know if MR felt that he should take the flu vaccine.  He stated that he has NEVER taken the flu shot but after what he went through this year he wanted to ask MR.   He stated that he has had all of his covid vaccines and will get his booster on 09/15/21 and he did receive his PNA vaccine before he left the hospital.  MR please advise. Thanks

## 2021-07-09 NOTE — Telephone Encounter (Signed)
As of 05/30/21 -> he was still desaturating with walk. So likely needs night o2 but if he does not want to do it at night that is upto him and we can see how it goes

## 2021-07-10 NOTE — Telephone Encounter (Signed)
I have called and LM on VM to make the pt aware that MR does rec that he have the flu vaccine.

## 2021-07-29 NOTE — Telephone Encounter (Signed)
See phone note from 09/26. Will close encounter.

## 2021-09-29 ENCOUNTER — Other Ambulatory Visit: Payer: Self-pay

## 2021-09-30 ENCOUNTER — Other Ambulatory Visit: Payer: Self-pay

## 2021-10-17 ENCOUNTER — Encounter: Payer: Self-pay | Admitting: Internal Medicine

## 2021-10-17 ENCOUNTER — Other Ambulatory Visit: Payer: Self-pay | Admitting: Internal Medicine

## 2021-10-17 ENCOUNTER — Ambulatory Visit: Payer: 59 | Attending: Internal Medicine | Admitting: Internal Medicine

## 2021-10-17 ENCOUNTER — Other Ambulatory Visit: Payer: Self-pay

## 2021-10-17 VITALS — BP 150/92 | HR 73 | Resp 16 | Wt 257.6 lb

## 2021-10-17 DIAGNOSIS — R4586 Emotional lability: Secondary | ICD-10-CM

## 2021-10-17 DIAGNOSIS — E669 Obesity, unspecified: Secondary | ICD-10-CM

## 2021-10-17 DIAGNOSIS — I10 Essential (primary) hypertension: Secondary | ICD-10-CM | POA: Diagnosis not present

## 2021-10-17 DIAGNOSIS — Z5320 Procedure and treatment not carried out because of patient's decision for unspecified reasons: Secondary | ICD-10-CM | POA: Insufficient documentation

## 2021-10-17 DIAGNOSIS — J439 Emphysema, unspecified: Secondary | ICD-10-CM | POA: Insufficient documentation

## 2021-10-17 DIAGNOSIS — I2510 Atherosclerotic heart disease of native coronary artery without angina pectoris: Secondary | ICD-10-CM

## 2021-10-17 DIAGNOSIS — J9611 Chronic respiratory failure with hypoxia: Secondary | ICD-10-CM

## 2021-10-17 DIAGNOSIS — Z9981 Dependence on supplemental oxygen: Secondary | ICD-10-CM

## 2021-10-17 MED ORDER — ESCITALOPRAM OXALATE 20 MG PO TABS: 20.0000 mg | ORAL_TABLET | Freq: Every day | ORAL | 2 refills | Status: DC

## 2021-10-17 MED ORDER — ESCITALOPRAM OXALATE 20 MG PO TABS
20.0000 mg | ORAL_TABLET | Freq: Every day | ORAL | 2 refills | Status: DC
  Filled 2021-10-17: qty 90, 90d supply, fill #0
  Filled 2021-12-25: qty 30, 30d supply, fill #0
  Filled 2022-01-23: qty 30, 30d supply, fill #1
  Filled 2022-01-26: qty 90, 90d supply, fill #1

## 2021-10-17 NOTE — Telephone Encounter (Signed)
Pt called in for assistance. Pt was seen today by Dr. Wynetta Emery. Pt says that he had his Rx sent to [facility]. Pt says that he need to have Rx escitalopram (LEXAPRO) 20 MG tablet  sent to the [facility] instead. Pt will pick up next week.     Please assist pt further.

## 2021-10-17 NOTE — Telephone Encounter (Signed)
Requested Prescriptions  Pending Prescriptions Disp Refills   escitalopram (LEXAPRO) 20 MG tablet 90 tablet 2    Sig: Take 1 tablet (20 mg total) by mouth daily.     Psychiatry:  Antidepressants - SSRI Passed - 10/17/2021  3:28 PM      Passed - Valid encounter within last 6 months    Recent Outpatient Visits          Today Essential hypertension   [place], MD   5 months ago Encounter to establish care   [place], MD   9 months ago COVID-19 virus infection   Pearl [place], Dionne Bucy, [place]      Future Appointments            In 1 month Brand Males, MD [facility]   In 4 months Wynetta Emery Dalbert Batman, MD [place]

## 2021-10-17 NOTE — Progress Notes (Signed)
Patient ID: Matthew [name], male    DOB: 04-Jul-1966  MRN: [id number]  CC: No chief complaint on file.   Subjective: Matthew [name] is a 56 y.o. male who presents for chronic ds managmeent His concerns today include:  Patient with history of CAD with 2 stent,  HL, former smoker ( quit 11/2019), emphysema on CT with normal PFTs 01/2020, COVID pneumonia with resulting chronic hypoxia req home O2 (09/2020), hemoptysis.  History of chronic respiratory failure with hypoxia on home O2/emphysema:  Uses O2 at nights PRN.  Never has to use it during the day.  He has upcoming appointment with [name] for follow-up in February.  He has a CAT scan of the chest scheduled prior to that visit. -Not using albuterol at all.  CAD/elev BP:  BP elev on last visit.  Much more elevated today.  Patient states he was a bit nervous about coming in today.  He limits salt in the foods.  He does not wish to be on any blood pressure medications.  States he will start drinking some vinegar to get his blood pressure down.  Denies any chest pains, shortness of breath, headaches or dizziness.  He takes aspirin as needed.  On last visit he declined statin therapy and still declines it today.    On Lexapro for mood swings.  He finds it helpful.  He is requesting refill.    Obesity:  up 13 lbs since 05/2021.  He admits that he has been overeating.  Not getting in much exercise due to the cold weather.  HM:  flu shot in 08/2021 at [place].  Had 4 COVID shots.  Decline shingrix.  He reports having had colonoscopy a few years ago by Dr. Ronalee Red.  He is agreeable to signing a release for [place] to get a copy of the colonoscopy report. Patient Active Problem List   Diagnosis Date Noted   Pulmonary emphysema, unspecified emphysema type ([place]) 10/17/2021   Mood swings 04/24/2021   Elevated blood pressure reading 04/24/2021   Obesity (BMI 30.0-34.9) 04/24/2021   Acute respiratory failure with hypoxia ([place]) 09/26/2020    CAD (coronary artery disease) 09/26/2020   COVID-19 virus infection 09/26/2020   Tobacco abuse 12/04/2019   Respiratory failure ([place])    Hemoptysis 11/17/2019     Current Outpatient Medications on File Prior to Visit  Medication Sig Dispense Refill   albuterol (VENTOLIN HFA) 108 (90 Base) MCG/ACT inhaler INHALE 2 PUFFS INTO THE LUNGS EVERY SIX HOURS AS NEEDED FOR WHEEZING OR SHORTNESS OF BREATH. (Patient not taking: Reported on 04/24/2021) 18 g 2   Ascorbic Acid (VITAMIN C) 1000 MG tablet Take 1,000 mg by mouth daily.     Multiple Vitamins-Minerals (CERTAVITE/ANTIOXIDANTS) TABS TAKE 1 TABLET BY MOUTH DAILY. (Patient not taking: Reported on 04/24/2021) 30 tablet 0   vitamin B-12 (CYANOCOBALAMIN) 1000 MCG tablet Take 5,000 mcg by mouth daily.     Vitamin D, Cholecalciferol, 10 MCG (400 UNIT) CAPS Take 400 Units by mouth daily.     vitamin E 1000 UNIT capsule Take 1,000 Units by mouth daily.     No current facility-administered medications on file prior to visit.    Allergies  Allergen Reactions   Bee Venom    Codeine Nausea And Vomiting   Onion Swelling    "thoart swells up"    Social History   Socioeconomic History   Marital status: Married    Spouse name: Not on file   Number of children: Not  on file   Years of education: Not on file   Highest education level: Not on file  Occupational History   Not on file  Tobacco Use   Smoking status: Former    Packs/day: 2.00    Years: 39.00    Pack years: 78.00    Types: Cigarettes    Start date: 13    Quit date: 11/17/2019    Years since quitting: 1.9   Smokeless tobacco: Never   Tobacco comments:    since age 41  Vaping Use   Vaping Use: Never used  Substance and Sexual Activity   Alcohol use: Yes    Comment: seldom   Drug use: No   Sexual activity: Yes  Other Topics Concern   Not on file  Social History Narrative   Not on file   Social Determinants of Health   Financial Resource Strain: Not on file  Food  Insecurity: Not on file  Transportation Needs: Not on file  Physical Activity: Not on file  Stress: Not on file  Social Connections: Not on file  Intimate Partner Violence: Not on file    Family History  Problem Relation Age of Onset   Cancer Mother    Cancer Father     Past Surgical History:  Procedure Laterality Date   CARDIAC CATHETERIZATION  12-09-2009  dr Ilda Foil   abnormal stress test (12-05-2009) widely patent coronaries present w/ evidence of diffuse atherosclerotic disease/  widely patent stented segments in the proximal and mid segment RCA/  LVEF 65%   CORONARY ANGIOPLASTY WITH STENT PLACEMENT  12-28-2008  dr berry   PCI and stenting of the proxmial and mid RCA using cutting balloon atherectomy (x2 DES, Xience)/  LVEF >60% w/ normal wall motion   ESOPHAGOGASTRODUODENOSCOPY N/A 11/17/2019   Procedure: ESOPHAGOGASTRODUODENOSCOPY (EGD);  Surgeon: Carol Ada, MD;  Location: [facility];  Service: Gastroenterology;  Laterality: N/A;   KNEE ARTHROSCOPY W/ MENISCECTOMY Right 2002   NEUROPLASTY / TRANSPOSITION ULNAR NERVE AT ELBOW Right 10/2014   dr Bobette Mo BURSECTOMY Left 05/06/2017   Procedure: LEFT ELBOW OLECRANON BURSA EXCISION;  Surgeon: Iran Planas, MD;  Location: [facility];  Service: Orthopedics;  Laterality: Left;   ORIF RIGHT ANKLE FX  2006   TONSILLECTOMY  age 57   TRANSTHORACIC ECHOCARDIOGRAM  03/30/2016   mild concentric LVH, ef 50-55%/  trivial TR    ROS: Review of Systems Negative except as stated above  PHYSICAL EXAM: BP (!) 150/92    Pulse 73    Resp 16    Wt 257 lb 9.6 oz (116.8 kg)    SpO2 95%    BMI 35.93 kg/m   Wt Readings from Last 3 Encounters:  10/17/21 257 lb 9.6 oz (116.8 kg)  05/30/21 242 lb (109.8 kg)  04/24/21 237 lb 9.6 oz (107.8 kg)   Pulse ox today is on room air.  He does not have portable oxygen with him Physical Exam General appearance - alert, well appearing, obese caucasian male and in no  distress Mental status - normal mood, behavior, speech, dress, motor activity, and thought processes Neck - supple, no significant adenopathy Chest - clear to auscultation, no wheezes, rales or rhonchi, symmetric air entry Heart - normal rate, regular rhythm, normal S1, S2, no murmurs, rubs, clicks or gallops Extremities - peripheral pulses normal, no pedal edema, no clubbing or cyanosis  CMP Latest Ref Rng & Units 12/10/2020 12/10/2020 11/09/2020  Glucose 70 - 99 mg/dL 117(H) - 118(H)  BUN 6 - 23 mg/dL 10 - 12  Creatinine 0.40 - 1.50 mg/dL 0.74 - 0.91  Sodium 135 - 145 mEq/L 134(L) - 136  Potassium 3.5 - 5.1 mEq/L 4.2 - 4.1  Chloride 96 - 112 mEq/L 99 - 98  CO2 19 - 32 mEq/L 26 - 28  Calcium 8.4 - 10.5 mg/dL 10.1 - 8.6(L)  Total Protein 6.0 - 8.3 g/dL 8.5(H) 8.5(H) 7.9  Total Bilirubin 0.2 - 1.2 mg/dL 0.5 0.5 0.8  Alkaline Phos 39 - 117 U/L 107 107 102  AST 0 - 37 U/L 28 28 20   ALT 0 - 53 U/L 36 36 23   Lipid Panel     Component Value Date/Time   TRIG 197 (H) 09/27/2020 0023    CBC    Component Value Date/Time   WBC 15.3 (H) 01/01/2021 1048   RBC 4.12 (L) 01/01/2021 1048   HGB 13.0 01/01/2021 1048   HCT 38.4 (L) 01/01/2021 1048   PLT 190.0 01/01/2021 1048   MCV 93.3 01/01/2021 1048   MCH 30.3 11/09/2020 0452   MCHC 33.8 01/01/2021 1048   RDW 15.9 (H) 01/01/2021 1048   LYMPHSABS 1.8 12/10/2020 1145   MONOABS 0.9 12/10/2020 1145   EOSABS 0.1 12/10/2020 1145   BASOSABS 0.1 12/10/2020 1145    ASSESSMENT AND PLAN: 1. Essential hypertension Patient's repeat blood pressure today still elevated.  Patient informed that he has hypertension.  Discussed health risks of uncontrolled blood pressure.  I do not think drinking vinegar will get his blood pressure down.  I recommend starting antihypertensive medication.  Patient declines.  He tells me that he will have [name] check his blood pressure when he sees him next month and will make decision at that time about whether to  start medicine. - CBC; Future - Comprehensive metabolic panel; Future  2. Obesity (BMI 30-39.9) Patient advised to eliminate sugary drinks from the diet, cut back on portion sizes especially of white carbohydrates, eat more white lean meat like chicken [place] and seafood instead of beef or pork and incorporate fresh fruits and vegetables into the diet daily. -Encourage regular exercise.  Patient plans to start moving more.  3. Coronary artery disease involving native coronary artery of native heart without angina pectoris 4. Statin declined Encouraged him to take a baby aspirin every day.  Recommend statin therapy but patient again declines stating that he does not want to be on too many medications.  5. Mood swings Refill sent on Lexapro.  6. Chronic respiratory failure with hypoxia, on home oxygen therapy ([place]) Patient has been doing well without having to use oxygen.  He has follow-up appointment with [name] next month  7. Pulmonary emphysema, unspecified emphysema type [facility]) Patient feels his breathing is fine.  He has not had to use albuterol inhaler.   Patient was given the opportunity to ask questions.  Patient verbalized understanding of the plan and was able to repeat key elements of the plan.   Orders Placed This Encounter  Procedures   CBC   Comprehensive metabolic panel     Requested Prescriptions    No prescriptions requested or ordered in this encounter    Return in about 4 months (around 02/14/2022) for Sign release to get colonoscopy from Dr. Renaye Rakers office.  Karle Plumber, MD, FACP

## 2021-10-18 ENCOUNTER — Other Ambulatory Visit: Payer: Self-pay

## 2021-10-20 ENCOUNTER — Ambulatory Visit: Payer: 59 | Attending: Internal Medicine

## 2021-10-20 ENCOUNTER — Other Ambulatory Visit: Payer: Self-pay

## 2021-10-20 DIAGNOSIS — I10 Essential (primary) hypertension: Secondary | ICD-10-CM

## 2021-10-21 ENCOUNTER — Telehealth: Payer: Self-pay

## 2021-10-21 LAB — COMPREHENSIVE METABOLIC PANEL
ALT: 21 IU/L (ref 0–44)
AST: 17 IU/L (ref 0–40)
Albumin/Globulin Ratio: 1.6 (ref 1.2–2.2)
Albumin: 4.4 g/dL (ref 3.8–4.9)
Alkaline Phosphatase: 118 IU/L (ref 44–121)
BUN/Creatinine Ratio: 14 (ref 9–20)
BUN: 16 mg/dL (ref 6–24)
Bilirubin Total: 0.3 mg/dL (ref 0.0–1.2)
CO2: 24 mmol/L (ref 20–29)
Calcium: 9.6 mg/dL (ref 8.7–10.2)
Chloride: 99 mmol/L (ref 96–106)
Creatinine, Ser: 1.14 mg/dL (ref 0.76–1.27)
Globulin, Total: 2.8 g/dL (ref 1.5–4.5)
Glucose: 112 mg/dL — ABNORMAL HIGH (ref 70–99)
Potassium: 4.5 mmol/L (ref 3.5–5.2)
Sodium: 137 mmol/L (ref 134–144)
Total Protein: 7.2 g/dL (ref 6.0–8.5)
eGFR: 75 mL/min/{1.73_m2} (ref >59)

## 2021-10-21 LAB — CBC
Hematocrit: 45.5 % (ref 37.5–51.0)
Hemoglobin: 15.6 g/dL (ref 13.0–17.7)
MCH: 30.7 pg (ref 26.6–33.0)
MCHC: 34.3 g/dL (ref 31.5–35.7)
MCV: 90 fL (ref 79–97)
Platelets: 244 10*3/uL (ref 150–450)
RBC: 5.08 x10E6/uL (ref 4.14–5.80)
RDW: 12.6 % (ref 11.6–15.4)
WBC: 9.9 10*3/uL (ref 3.4–10.8)

## 2021-10-21 NOTE — Telephone Encounter (Signed)
Contacted pt to go over lab results pt is aware and doesn't have any questions or concerns 

## 2021-11-07 ENCOUNTER — Ambulatory Visit: Payer: Self-pay | Admitting: Internal Medicine

## 2021-11-19 ENCOUNTER — Encounter (HOSPITAL_COMMUNITY): Payer: Self-pay

## 2021-11-19 ENCOUNTER — Ambulatory Visit (HOSPITAL_COMMUNITY)
Admission: RE | Admit: 2021-11-19 | Discharge: 2021-11-19 | Disposition: A | Discharge status: Home / Self Care | Payer: 59 | Source: Ambulatory Visit | Attending: Internal Medicine | Admitting: Internal Medicine

## 2021-11-19 ENCOUNTER — Other Ambulatory Visit: Payer: Self-pay

## 2021-11-19 DIAGNOSIS — U099 Post covid-19 condition, unspecified: Secondary | ICD-10-CM | POA: Diagnosis present

## 2021-11-19 DIAGNOSIS — J439 Emphysema, unspecified: Secondary | ICD-10-CM | POA: Diagnosis present

## 2021-11-19 DIAGNOSIS — R0902 Hypoxemia: Secondary | ICD-10-CM | POA: Insufficient documentation

## 2021-11-19 DIAGNOSIS — Z87891 Personal history of nicotine dependence: Secondary | ICD-10-CM | POA: Insufficient documentation

## 2021-11-19 DIAGNOSIS — R0609 Other forms of dyspnea: Secondary | ICD-10-CM | POA: Insufficient documentation

## 2021-11-19 DIAGNOSIS — J8410 Pulmonary fibrosis, unspecified: Secondary | ICD-10-CM | POA: Insufficient documentation

## 2021-11-20 ENCOUNTER — Encounter: Payer: Self-pay | Admitting: Internal Medicine

## 2021-11-20 ENCOUNTER — Ambulatory Visit (INDEPENDENT_AMBULATORY_CARE_PROVIDER_SITE_OTHER): Payer: 59 | Admitting: Internal Medicine

## 2021-11-20 VITALS — BP 120/80 | HR 78 | Temp 97.8°F | Ht 71.0 in | Wt 261.2 lb

## 2021-11-20 DIAGNOSIS — Z129 Encounter for screening for malignant neoplasm, site unspecified: Secondary | ICD-10-CM | POA: Diagnosis not present

## 2021-11-20 DIAGNOSIS — R0609 Other forms of dyspnea: Secondary | ICD-10-CM

## 2021-11-20 DIAGNOSIS — Z87891 Personal history of nicotine dependence: Secondary | ICD-10-CM

## 2021-11-20 DIAGNOSIS — U099 Post covid-19 condition, unspecified: Secondary | ICD-10-CM

## 2021-11-20 DIAGNOSIS — J439 Emphysema, unspecified: Secondary | ICD-10-CM

## 2021-11-20 DIAGNOSIS — J8410 Pulmonary fibrosis, unspecified: Secondary | ICD-10-CM | POA: Diagnosis not present

## 2021-11-20 MED ORDER — SPIRIVA RESPIMAT 2.5 MCG/ACT IN AERS: 2.0000 | INHALATION_SPRAY | Freq: Every day | RESPIRATORY_TRACT | 0 refills | Status: DC

## 2021-11-20 NOTE — Patient Instructions (Addendum)
ICD-10-CM   1. COVID-19 long hauler manifesting chronic dyspnea  R06.09    U09.9     2. Pulmonary emphysema, unspecified emphysema type ([place])  J43.9     3. 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 Cancer screening  Z12.9     5. Stopped smoking with greater than 40 pack year history  Z87.891       STable Noticed usig o2 as needed and per your report not dropping o2 with walking Noted that you are having copd/chronic bronchitis symptoms of cough/congestion Glad smoking still in remission Noted weight gain  Plan --You can use albuterol as needed - start spiriva respimat 2 puff once daily scheduled - do PFT in 1 year - do HRCT in 1 year  Follow-up - 12 months do high-resolution CT chest supine and prone -Return to see [name] in a 15-minute slot in 12 months  - cosnider Alpha 1 at followup

## 2021-11-20 NOTE — Progress Notes (Signed)
Patient seen in the office today and instructed on use of Spiriva Respimat 2.5.  Patient expressed understanding and demonstrated technique. 

## 2021-11-20 NOTE — Addendum Note (Signed)
Addended by: Lorretta Harp on: 11/20/2021 10:32 AM   Modules accepted: Orders

## 2021-11-20 NOTE — Progress Notes (Signed)
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 History  Administered Date(s) Administered   DTaP 01/07/2015   Influenza-Unspecified 08/12/2021   PFIZER(Purple Top)SARS-COV-2 Vaccination 12/25/2020, 01/15/2021   Pneumococcal Polysaccharide-23 11/16/2020    Family History  Problem Relation Age of Onset   Cancer Mother    Cancer Father      Current Outpatient Medications:    Ascorbic Acid (VITAMIN C) 1000 MG tablet, Take 1,000 mg by mouth daily., Disp: , Rfl:    escitalopram (LEXAPRO) 20 MG tablet, Take 1 tablet (20 mg total) by mouth daily., Disp: 90 tablet, Rfl: 2   vitamin B-12 (CYANOCOBALAMIN) 1000 MCG tablet, Take 5,000 mcg by mouth daily., Disp: , Rfl:    Vitamin D, Cholecalciferol, 10 MCG (400 UNIT) CAPS, Take 400 Units by mouth daily., Disp: , Rfl:    vitamin E 1000 UNIT capsule, Take  1,000 Units by mouth daily., Disp: , Rfl:    albuterol (VENTOLIN HFA) 108 (90 Base) MCG/ACT inhaler, INHALE 2 PUFFS INTO THE LUNGS EVERY SIX HOURS AS NEEDED FOR WHEEZING OR SHORTNESS OF BREATH. (Patient not taking: Reported on 04/24/2021), Disp: 18 g, Rfl: 2      Objective:   Vitals:   11/20/21 0942  BP: 120/80  Pulse: 78  Temp: 97.8 F (36.6 C)  TempSrc: Oral  SpO2: 96%  Weight: 261 lb 3.2 oz (118.5 kg)  Height: $Remove'5\' 11"'hgtcIGY$  (1.803 m)    Estimated body mass index is 36.43 kg/m as calculated from the following:   Height as of this encounter: $RemoveBeforeD'5\' 11"'ZUYsPbvnudUdSE$  (1.803 m).   Weight as of this encounter: 261 lb 3.2 oz (118.5 kg).  $Rem'@WEIGHTCHANGE'hUzH$ @  [employer]   11/20/21 0942  Weight: 261 lb 3.2 oz (118.5 kg)     Physical Exam    General: No distress. [place] . Looks well Neuro: Alert and Oriented x 3. GCS 15. Speech normal Psych: Pleasant Resp:  Barrel Chest - no.  Wheeze - no, Crackles - no, No overt respiratory distress CVS: Normal heart sounds. Murmurs - no Ext: Stigmata of Connective Tissue Disease - no HEENT: Normal upper airway. PEERL +. No post nasal drip        Assessment:       ICD-10-CM   1. COVID-19 long hauler  manifesting chronic dyspnea  R06.09    U09.9     2. Pulmonary emphysema, unspecified emphysema type ([place])  J43.9     3. 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 Cancer screening  Z12.9     5. Stopped smoking with greater than 40 pack year history  Z87.891          Plan:     Patient Instructions     ICD-10-CM   1. COVID-19 long hauler manifesting chronic dyspnea  R06.09    U09.9     2. Pulmonary emphysema, unspecified emphysema type ([place])  J43.9     3. 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 Cancer screening  Z12.9     5. Stopped smoking with greater than 40 pack year history  Z87.891       STable Noticed usig o2 as needed and per your report not dropping o2 with walking Noted that you are having  copd/chronic bronchitis symptoms of cough/congestion Glad smoking still in remission Noted weight gain  Plan --You can use albuterol as needed - start spiriva respimat 2 puff once daily scheduled - do PFT in 1 year - do HRCT in 1 year  Follow-up - 12 months do high-resolution CT chest supine and prone -Return to see [name] in a 15-minute slot in 12 months  - cosnider Alpha 1 at followup     SIGNATURE    Dr. Brand Males, M.D., F.C.C.P,  Pulmonary and Critical Care Medicine Staff Physician, [place] Director - Interstitial Lung Disease  Program  Pulmonary [place] [facility] [place], [place], [address]  Pager: [phone], If no answer or between  15:00h - 7:00h: call [phone] Telephone: [phone]  10:18 AM 11/20/2021

## 2021-11-27 ENCOUNTER — Ambulatory Visit: Payer: Self-pay | Admitting: Internal Medicine

## 2021-12-03 ENCOUNTER — Telehealth: Payer: Self-pay | Admitting: Internal Medicine

## 2021-12-03 MED ORDER — PREDNISONE 10 MG PO TABS: ORAL_TABLET | ORAL | 0 refills | Status: AC

## 2021-12-03 MED ORDER — DOXYCYCLINE HYCLATE 100 MG PO TABS: 100.0000 mg | ORAL_TABLET | Freq: Two times a day (BID) | ORAL | 0 refills | Status: AC

## 2021-12-03 NOTE — Telephone Encounter (Signed)
Called and spoke with patient to let him know the recs from [name]. Advised him to call the office if his covid test is positive so we can send in additional meds. He expressed understanding and verified preferred pharmacy. Nothing further needed at this time.

## 2021-12-03 NOTE — Telephone Encounter (Signed)
Spoke to patient.  Patient wanted to make [name] aware that covid test was negative.   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

## 2021-12-03 NOTE — Telephone Encounter (Signed)
°  He should definitely get a COVID test done and if he has COVID he should call for antiviral but in the interim   Please take prednisone 40 mg x1 day, then 30 mg x1 day, then 20 mg x1 day, then 10 mg x1 day, and then 5 mg x1 day and stop  Take doxycycline 100mg  po twice daily x 5 days; take after meals and avoid sunlight   Allergies  Allergen Reactions   Bee Venom    Codeine Nausea And Vomiting   Onion Swelling    "thoart swells up"

## 2021-12-03 NOTE — Telephone Encounter (Signed)
Called and spoke with patient who states that he is having symptoms of productive cough with yellow/green sputum, congestion and sore throat. Symptoms started Sunday. Has not done covid test. Denies fever Pharmacy is [facility]   Please advise

## 2021-12-25 ENCOUNTER — Other Ambulatory Visit: Payer: Self-pay

## 2022-01-23 ENCOUNTER — Other Ambulatory Visit: Payer: Self-pay

## 2022-01-26 ENCOUNTER — Other Ambulatory Visit: Payer: Self-pay

## 2022-02-11 ENCOUNTER — Telehealth: Payer: Self-pay | Admitting: Internal Medicine

## 2022-02-11 ENCOUNTER — Telehealth: Payer: Self-pay | Admitting: Emergency Medicine

## 2022-02-11 MED ORDER — SPIRIVA RESPIMAT 2.5 MCG/ACT IN AERS: 2.0000 | INHALATION_SPRAY | Freq: Every day | RESPIRATORY_TRACT | 11 refills | Status: DC

## 2022-02-11 NOTE — Telephone Encounter (Signed)
Additional encounter opened please see other encounter for information. Closing this encounter ?

## 2022-02-11 NOTE — Telephone Encounter (Signed)
Called and spoke with patient who is requesting for RX for Spiriva to be sent into preferred pharmacy. RX has been sent. Nothing further needed at this time. ?

## 2022-02-16 ENCOUNTER — Other Ambulatory Visit: Payer: Self-pay

## 2022-02-16 ENCOUNTER — Ambulatory Visit: Payer: 59 | Attending: Internal Medicine | Admitting: Internal Medicine

## 2022-02-16 ENCOUNTER — Encounter: Payer: Self-pay | Admitting: Internal Medicine

## 2022-02-16 VITALS — BP 136/94 | HR 74 | Temp 97.9°F | Resp 18 | Wt 262.3 lb

## 2022-02-16 DIAGNOSIS — J8410 Pulmonary fibrosis, unspecified: Secondary | ICD-10-CM

## 2022-02-16 DIAGNOSIS — J439 Emphysema, unspecified: Secondary | ICD-10-CM

## 2022-02-16 DIAGNOSIS — Z6836 Body mass index (BMI) 36.0-36.9, adult: Secondary | ICD-10-CM

## 2022-02-16 DIAGNOSIS — R4586 Emotional lability: Secondary | ICD-10-CM | POA: Diagnosis not present

## 2022-02-16 DIAGNOSIS — Z5320 Procedure and treatment not carried out because of patient's decision for unspecified reasons: Secondary | ICD-10-CM

## 2022-02-16 DIAGNOSIS — I2510 Atherosclerotic heart disease of native coronary artery without angina pectoris: Secondary | ICD-10-CM | POA: Diagnosis not present

## 2022-02-16 DIAGNOSIS — R030 Elevated blood-pressure reading, without diagnosis of hypertension: Secondary | ICD-10-CM

## 2022-02-16 MED ORDER — ESCITALOPRAM OXALATE 20 MG PO TABS
20.0000 mg | ORAL_TABLET | Freq: Every day | ORAL | 2 refills | Status: DC
  Filled 2022-02-16 – 2022-04-27 (×2): qty 90, 90d supply, fill #0
  Filled 2022-08-01: qty 90, 90d supply, fill #1

## 2022-02-16 NOTE — Progress Notes (Signed)
Patient has something to talk with provider about that he will not disclose with any other person. ?

## 2022-02-16 NOTE — Progress Notes (Signed)
? ? ?Patient ID: Matthew [name], male    DOB: Nov 12, 1965  MRN: [id number] ? ?CC: Follow-up and Hypertension ? ? ?Subjective: ?Matthew [name] is a 56 y.o. male who presents for chronic ds management ?His concerns today include:  ?Patient with history of CAD with 2 stent (pt has declined statin therapy),  HL, former smoker ( quit 11/2019), emphysema on CT with normal PFTs 01/2020, COVID pneumonia with resulting chronic hypoxia req home O2 (09/2020), hemoptysis. ? ?Patient upset today telling me that we almost had a lawsuit from him because I had ordered Lexapro  20 mg daily with 90 tablets but our pharmacy filled it for only 30 tablets.  He finally asked them about it and states he was told that the pharmacist had made the change to 30 pills instead of 90.  He tells me that they have since corrected it and dispense the 93-monthsupply. ?Reports doing well on Lexapro for mood swings. ? ?Gaining wgh.  States that he had lost some weight when he was sick in the hospital with COVID. ?Walking more, working out in the yard and "doing all I can do within reason." ?Not doing well with eating habits.  "If I want it I eat it."  Reports he does not eat a lot of fried foods, eating more veggies and fruits.  Drinks sweet tea and Dr. PMalachi Bonds? ?CAD/HL:  no CP.  Does not want statin ? ?BP was elevated on last visit with me.  He declined medication and wanted to hold off until his blood pressure was rechecked when he saw the pulmonologist that was scheduled for a month later.  On that visit with the pulmonologist, his blood pressure was good.  Patient tells me that he checks his blood pressure at home intermittently.  He has checked it about 3 times since he last saw me and reports that all 3 readings were good.     ?Not using a lot of salt in his foods.   ? ?Saw Dr. RChase Caller2/2023. ?Doing good on Spiriva.  Not having to use Albuterol ?CT scan showed pulmonary fibrosis post inflammatory from having COVID.  Not having to use his  oxygen much at all. ? ?HM:  I still have not received colonoscopy report from Dr. HRonalee Red  He thinks he signed a release on last visit with me. ? ?Patient Active Problem List  ? Diagnosis Date Noted  ? Pulmonary emphysema, unspecified [place]ysema type ([place] 10/17/2021  ? Essential hypertension 10/17/2021  ? Statin declined 10/17/2021  ? Mood swings 04/24/2021  ? Elevated blood pressure reading 04/24/2021  ? Obesity (BMI 30.0-34.9) 04/24/2021  ? Acute respiratory failure with hypoxia ([place] 09/26/2020  ? CAD (coronary artery disease) 09/26/2020  ? COVID-19 virus infection 09/26/2020  ? Tobacco abuse 12/04/2019  ? Respiratory failure ([place]   ? Hemoptysis 11/17/2019  ?  ? ?Current Outpatient Medications on File Prior to Visit  ?Medication Sig Dispense Refill  ? Ascorbic Acid (VITAMIN C) 1000 MG tablet Take 1,000 mg by mouth daily.    ? Aspirin 81 MG CAPS Aspirin 81 mg    ? escitalopram (LEXAPRO) 20 MG tablet Take 1 tablet (20 mg total) by mouth daily. 90 tablet 2  ? Tiotropium Bromide Monohydrate (SPIRIVA RESPIMAT) 2.5 MCG/ACT AERS Inhale 2 puffs into the lungs daily. 4 g 11  ? vitamin B-12 (CYANOCOBALAMIN) 1000 MCG tablet Take 5,000 mcg by mouth daily.    ? Vitamin D, Cholecalciferol, 10 MCG (400 UNIT) CAPS Take 400 Units  by mouth daily.    ? vitamin E 1000 UNIT capsule Take 1,000 Units by mouth daily.    ? albuterol (VENTOLIN HFA) 108 (90 Base) MCG/ACT inhaler INHALE 2 PUFFS INTO THE LUNGS EVERY SIX HOURS AS NEEDED FOR WHEEZING OR SHORTNESS OF BREATH. (Patient not taking: Reported on 04/24/2021) 18 g 2  ? ?No current facility-administered medications on file prior to visit.  ? ? ?Allergies  ?Allergen Reactions  ? Bee Venom   ? Codeine Nausea And Vomiting  ? Onion Swelling  ?  "thoart swells up"  ? ? ?Social History  ? ?Socioeconomic History  ? Marital status: Married  ?  Spouse name: Not on file  ? Number of children: Not on file  ? Years of education: Not on file  ? Highest education level: Not on file  ?Occupational  History  ? Not on file  ?Tobacco Use  ? Smoking status: Former  ?  Packs/day: 2.00  ?  Years: 39.00  ?  Pack years: 78.00  ?  Types: Cigarettes  ?  Start date: 42  ?  Quit date: 11/17/2019  ?  Years since quitting: 2.2  ? Smokeless tobacco: Never  ? Tobacco comments:  ?  since age 51  ?Vaping Use  ? Vaping Use: Never used  ?Substance and Sexual Activity  ? Alcohol use: Yes  ?  Comment: seldom  ? Drug use: No  ? Sexual activity: Yes  ?Other Topics Concern  ? Not on file  ?Social History Narrative  ? Not on file  ? ?Social Determinants of Health  ? ?Financial Resource Strain: Not on file  ?Food Insecurity: Not on file  ?Transportation Needs: Not on file  ?Physical Activity: Not on file  ?Stress: Not on file  ?Social Connections: Not on file  ?Intimate Partner Violence: Not on file  ? ? ?Family History  ?Problem Relation Age of Onset  ? Cancer Mother   ? Cancer Father   ? ? ?Past Surgical History:  ?Procedure Laterality Date  ? CARDIAC CATHETERIZATION  12-09-2009  dr Ilda Foil  ? abnormal stress test (12-05-2009) widely patent coronaries present w/ evidence of diffuse atherosclerotic disease/  widely patent stented segments in the proximal and mid segment RCA/  LVEF 65%  ? CORONARY ANGIOPLASTY WITH STENT PLACEMENT  12-28-2008  dr berry  ? PCI and stenting of the proxmial and mid RCA using cutting balloon atherectomy (x2 DES, Xience)/  LVEF >60% w/ normal wall motion  ? ESOPHAGOGASTRODUODENOSCOPY N/A 11/17/2019  ? Procedure: ESOPHAGOGASTRODUODENOSCOPY (EGD);  Surgeon: Carol Ada, MD;  Location: [facility];  Service: Gastroenterology;  Laterality: N/A;  ? KNEE ARTHROSCOPY W/ MENISCECTOMY Right 2002  ? NEUROPLASTY / TRANSPOSITION ULNAR NERVE AT ELBOW Right 10/2014   dr Caralyn Guile  ? OLECRANON BURSECTOMY Left 05/06/2017  ? Procedure: LEFT ELBOW OLECRANON BURSA EXCISION;  Surgeon: Iran Planas, MD;  Location: [facility];  Service: Orthopedics;  Laterality: Left;  ? ORIF RIGHT ANKLE FX  2006  ? TONSILLECTOMY   age 36  ? TRANSTHORACIC ECHOCARDIOGRAM  03/30/2016  ? mild concentric LVH, ef 50-55%/  trivial TR  ? ? ?ROS: ?Review of Systems ?Negative except as stated above ? ?PHYSICAL EXAM: ?BP (!) 136/94   Pulse 74   Temp 97.9 ?F (36.6 ?C) (Oral)   Resp 18   Wt 262 lb 4.8 oz (119 kg)   SpO2 93%   BMI 36.58 kg/m?   ?Wt Readings from Last 3 Encounters:  ?02/16/22 262 lb 4.8 oz (119  kg)  ?11/20/21 261 lb 3.2 oz (118.5 kg)  ?10/17/21 257 lb 9.6 oz (116.8 kg)  ? ? ?Physical Exam ? ? ?General appearance - alert, well appearing, obese older Caucasian male and in no distress ?Mental status - normal mood, behavior, speech, dress, motor activity, and thought processes ?Neck - supple, no significant adenopathy ?Chest -breath sounds slightly decreased without wheezes crackles or rhonchi. ?Heart - normal rate, regular rhythm, normal S1, S2, no murmurs, rubs, clicks or gallops ?Extremities -no lower extremity edema. ? ? ?  Latest Ref Rng & Units 10/20/2021  ? 10:28 AM 12/10/2020  ? 11:45 AM 11/09/2020  ?  4:52 AM  ?CMP  ?Glucose 70 - 99 mg/dL 112   117   118    ?BUN 6 - 24 mg/dL '16   10   12    '$ ?Creatinine 0.76 - 1.27 mg/dL 1.14   0.74   0.91    ?Sodium 134 - 144 mmol/L 137   134   136    ?Potassium 3.5 - 5.2 mmol/L 4.5   4.2   4.1    ?Chloride 96 - 106 mmol/L 99   99   98    ?CO2 20 - 29 mmol/L '24   26   28    '$ ?Calcium 8.7 - 10.2 mg/dL 9.6   10.1   8.6    ?Total Protein 6.0 - 8.5 g/dL 7.2   8.5    ? 8.5   7.9    ?Total Bilirubin 0.0 - 1.2 mg/dL 0.3   0.5    ? 0.5   0.8    ?Alkaline Phos 44 - 121 IU/L 118   107    ? 107   102    ?AST 0 - 40 IU/L 17   28    ? 28   20    ?ALT 0 - 44 IU/L 21   36    ? 36   23    ? ?Lipid Panel  ?   ?Component Value Date/Time  ? TRIG 197 (H) 09/27/2020 0023  ? ? ?CBC ?   ?Component Value Date/Time  ? WBC 9.9 10/20/2021 1028  ? WBC 15.3 (H) 01/01/2021 1048  ? RBC 5.08 10/20/2021 1028  ? RBC 4.12 (L) 01/01/2021 1048  ? HGB 15.6 10/20/2021 1028  ? HCT 45.5 10/20/2021 1028  ? PLT 244 10/20/2021 1028  ? MCV 90  10/20/2021 1028  ? MCH 30.7 10/20/2021 1028  ? MCH 30.3 11/09/2020 0452  ? MCHC 34.3 10/20/2021 1028  ? MCHC 33.8 01/01/2021 1048  ? RDW 12.6 10/20/2021 1028  ? LYMPHSABS 1.8 12/10/2020 1145  ? MONOABS 0.9 12/10/2020

## 2022-02-16 NOTE — Patient Instructions (Addendum)
Continue to check blood pressure.  Check at least once a week.  Normal blood pressure is 120/80 or lower.  ? ?Healthy Eating ?Following a healthy eating pattern may help you to achieve and maintain a healthy body weight, reduce the risk of chronic disease, and live a long and productive life. It is important to follow a healthy eating pattern at an appropriate calorie level for your body. Your nutritional needs should be met primarily through food by choosing a variety of nutrient-rich foods. ?What are tips for following this plan? ?Reading food labels ?Read labels and choose the following: ?Reduced or low sodium. ?Juices with 100% fruit juice. ?Foods with low saturated fats and high polyunsaturated and monounsaturated fats. ?Foods with whole grains, such as whole wheat, cracked wheat, brown rice, and wild rice. ?Whole grains that are fortified with folic acid. This is recommended for women who are pregnant or who want to become pregnant. ?Read labels and avoid the following: ?Foods with a lot of added sugars. These include foods that contain brown sugar, corn sweetener, corn syrup, dextrose, fructose, glucose, high-fructose corn syrup, honey, invert sugar, lactose, malt syrup, maltose, molasses, raw sugar, sucrose, trehalose, or turbinado sugar. ?Do not eat more than the following amounts of added sugar per day: ?6 teaspoons (25 g) for women. ?9 teaspoons (38 g) for men. ?Foods that contain processed or refined starches and grains. ?Refined grain products, such as white flour, degermed cornmeal, white bread, and white rice. ?Shopping ?Choose nutrient-rich snacks, such as vegetables, whole fruits, and nuts. Avoid high-calorie and high-sugar snacks, such as potato chips, fruit snacks, and candy. ?Use oil-based dressings and spreads on foods instead of solid fats such as butter, stick margarine, or cream cheese. ?Limit pre-made sauces, mixes, and "instant" products such as flavored rice, instant noodles, and  ready-made pasta. ?Try more plant-protein sources, such as tofu, tempeh, black beans, edamame, lentils, nuts, and seeds. ?Explore eating plans such as the Mediterranean diet or vegetarian diet. ?Cooking ?Use oil to saut? or stir-fry foods instead of solid fats such as butter, stick margarine, or lard. ?Try baking, boiling, grilling, or broiling instead of frying. ?Remove the fatty part of meats before cooking. ?Steam vegetables in water or broth. ?Meal planning ? ?At meals, imagine dividing your plate into fourths: ?One-half of your plate is fruits and vegetables. ?One-fourth of your plate is whole grains. ?One-fourth of your plate is protein, especially lean meats, poultry, eggs, tofu, beans, or nuts. ?Include low-fat dairy as part of your daily diet. ?Lifestyle ?Choose healthy options in all settings, including home, work, school, restaurants, or stores. ?Prepare your food safely: ?Wash your hands after handling raw meats. ?Keep food preparation surfaces clean by regularly washing with hot, soapy water. ?Keep raw meats separate from ready-to-eat foods, such as fruits and vegetables. ?Cook seafood, meat, poultry, and eggs to the recommended internal temperature. ?Store foods at safe temperatures. In general: ?Keep cold foods at 40?F (4.4?C) or below. ?Keep hot foods at 140?F (60?C) or above. ?Keep your freezer at 0?F (-17.8?C) or below. ?Foods are no longer safe to eat when they have been between the temperatures of 40?-140?F (4.4-60?C) for more than 2 hours. ?What foods should I eat? ?Fruits ?Aim to eat 2 cup-equivalents of fresh, canned (in natural juice), or frozen fruits each day. Examples of 1 cup-equivalent of fruit include 1 small apple, 8 large strawberries, 1 cup canned fruit, ? cup dried fruit, or 1 cup 100% juice. ?Vegetables ?Aim to eat 2?-3 cup-equivalents of fresh and  frozen vegetables each day, including different varieties and colors. Examples of 1 cup-equivalent of vegetables include 2 medium  carrots, 2 cups raw, leafy greens, 1 cup chopped vegetable (raw or cooked), or 1 medium baked potato. ?Grains ?Aim to eat 6 ounce-equivalents of whole grains each day. Examples of 1 ounce-equivalent of grains include 1 slice of bread, 1 cup ready-to-eat cereal, 3 cups popcorn, or ? cup cooked rice, pasta, or cereal. ?Meats and other proteins ?Aim to eat 5-6 ounce-equivalents of protein each day. Examples of 1 ounce-equivalent of protein include 1 egg, 1/2 cup nuts or seeds, or 1 tablespoon (16 g) peanut butter. A cut of meat or fish that is the size of a deck of cards is about 3-4 ounce-equivalents. ?Of the protein you eat each week, try to have at least 8 ounces come from seafood. This includes salmon, trout, herring, and anchovies. ?Dairy ?Aim to eat 3 cup-equivalents of fat-free or low-fat dairy each day. Examples of 1 cup-equivalent of dairy include 1 cup (240 mL) milk, 8 ounces (250 g) yogurt, 1? ounces (44 g) natural cheese, or 1 cup (240 mL) fortified soy milk. ?Fats and oils ?Aim for about 5 teaspoons (21 g) per day. Choose monounsaturated fats, such as canola and olive oils, avocados, peanut butter, and most nuts, or polyunsaturated fats, such as sunflower, corn, and soybean oils, walnuts, pine nuts, sesame seeds, sunflower seeds, and flaxseed. ?Beverages ?Aim for six 8-oz glasses of water per day. Limit coffee to three to five 8-oz cups per day. ?Limit caffeinated beverages that have added calories, such as soda and energy drinks. ?Limit alcohol intake to no more than 1 drink a day for nonpregnant women and 2 drinks a day for men. One drink equals 12 oz of beer (355 mL), 5 oz of wine (148 mL), or 1? oz of hard liquor (44 mL). ?Seasoning and other foods ?Avoid adding excess amounts of salt to your foods. Try flavoring foods with herbs and spices instead of salt. ?Avoid adding sugar to foods. ?Try using oil-based dressings, sauces, and spreads instead of solid fats. ?This information is based on general [place]  nutrition guidelines. For more information, visit [employer]. Exact amounts may vary based on your nutrition needs. ?Summary ?A healthy eating plan may help you to maintain a healthy weight, reduce the risk of chronic diseases, and stay active throughout your life. ?Plan your meals. Make sure you eat the right portions of a variety of nutrient-rich foods. ?Try baking, boiling, grilling, or broiling instead of frying. ?Choose healthy options in all settings, including home, work, school, restaurants, or stores. ?This information is not intended to replace advice given to you by your health care provider. Make sure you discuss any questions you have with your health care provider. ?Document Revised: 05/27/2021 Document Reviewed: 05/27/2021 ?Elsevier Patient Education ? [place]. ? ?

## 2022-04-27 ENCOUNTER — Other Ambulatory Visit: Payer: Self-pay

## 2022-04-28 ENCOUNTER — Other Ambulatory Visit: Payer: Self-pay

## 2022-06-22 ENCOUNTER — Ambulatory Visit: Payer: 59 | Admitting: Internal Medicine

## 2022-07-20 ENCOUNTER — Telehealth: Payer: Self-pay | Admitting: Internal Medicine

## 2022-07-20 NOTE — Telephone Encounter (Signed)
Sir before I call patient back to tell him places to get the Covid 19 vaccine, are you okay with patient getting vaccine or do you have other recommendations for patient  Please advise sir

## 2022-07-20 NOTE — Telephone Encounter (Signed)
Called and spoke to patient about Dr Orvil Feil recommendations and he verbalized understanding. And he states that a lot of locations is running out of the pfirzer vaccine and so he was wondering if he should get the moderna vaccine.   He states that he is also getting his flu shot as well.   Please advise

## 2022-07-20 NOTE — Telephone Encounter (Signed)
He had life-threatening COVID disease and was severely ill for many weeks and months before he got out of the ICU in the hospital.  It has been a while.  Therefore a repeat COVID mRNA booster is indicated especially against the new strain.  He should be able to get this at a [facility] or [facility] or [facility]

## 2022-07-21 NOTE — Telephone Encounter (Signed)
Called patient but he did not answer. Left message for him to call back.  

## 2022-07-21 NOTE — Telephone Encounter (Signed)
Modernal vaccine is fine but he needs to make sure there is the most recent updated booster

## 2022-07-21 NOTE — Telephone Encounter (Signed)
Patient returned call. He verbalized understanding.   Nothing further needed at time of call.

## 2022-07-27 ENCOUNTER — Other Ambulatory Visit: Payer: Self-pay

## 2022-07-27 MED ORDER — COVID-19 MRNA 2023-2024 VACCINE (COMIRNATY) 0.3 ML INJECTION
0.3000 mL | INTRAMUSCULAR | 0 refills | Status: DC
  Filled 2022-07-27: qty 0.3, 1d supply, fill #0

## 2022-08-03 ENCOUNTER — Other Ambulatory Visit: Payer: Self-pay

## 2022-08-19 DIAGNOSIS — R030 Elevated blood-pressure reading, without diagnosis of hypertension: Secondary | ICD-10-CM | POA: Diagnosis not present

## 2022-08-19 DIAGNOSIS — I2510 Atherosclerotic heart disease of native coronary artery without angina pectoris: Secondary | ICD-10-CM | POA: Diagnosis not present

## 2022-08-19 DIAGNOSIS — E669 Obesity, unspecified: Secondary | ICD-10-CM | POA: Diagnosis not present

## 2022-08-19 DIAGNOSIS — Z809 Family history of malignant neoplasm, unspecified: Secondary | ICD-10-CM | POA: Diagnosis not present

## 2022-08-19 DIAGNOSIS — R0602 Shortness of breath: Secondary | ICD-10-CM | POA: Diagnosis not present

## 2022-08-19 DIAGNOSIS — Z87892 Personal history of anaphylaxis: Secondary | ICD-10-CM | POA: Diagnosis not present

## 2022-08-19 DIAGNOSIS — Z885 Allergy status to narcotic agent status: Secondary | ICD-10-CM | POA: Diagnosis not present

## 2022-08-19 DIAGNOSIS — Z87891 Personal history of nicotine dependence: Secondary | ICD-10-CM | POA: Diagnosis not present

## 2022-08-19 DIAGNOSIS — R69 Illness, unspecified: Secondary | ICD-10-CM | POA: Diagnosis not present

## 2022-08-19 DIAGNOSIS — Z6835 Body mass index (BMI) 35.0-35.9, adult: Secondary | ICD-10-CM | POA: Diagnosis not present

## 2022-08-20 DIAGNOSIS — M5459 Other low back pain: Secondary | ICD-10-CM | POA: Diagnosis not present

## 2022-08-20 DIAGNOSIS — M9902 Segmental and somatic dysfunction of thoracic region: Secondary | ICD-10-CM | POA: Diagnosis not present

## 2022-08-20 DIAGNOSIS — M546 Pain in thoracic spine: Secondary | ICD-10-CM | POA: Diagnosis not present

## 2022-08-20 DIAGNOSIS — M461 Sacroiliitis, not elsewhere classified: Secondary | ICD-10-CM | POA: Diagnosis not present

## 2022-08-20 DIAGNOSIS — M62838 Other muscle spasm: Secondary | ICD-10-CM | POA: Diagnosis not present

## 2022-08-20 DIAGNOSIS — M9903 Segmental and somatic dysfunction of lumbar region: Secondary | ICD-10-CM | POA: Diagnosis not present

## 2022-08-20 DIAGNOSIS — M9904 Segmental and somatic dysfunction of sacral region: Secondary | ICD-10-CM | POA: Diagnosis not present

## 2022-08-24 ENCOUNTER — Telehealth: Payer: Self-pay | Admitting: Internal Medicine

## 2022-08-24 MED ORDER — PREDNISONE 10 MG PO TABS: ORAL_TABLET | ORAL | 0 refills | Status: AC

## 2022-08-24 MED ORDER — SPIRIVA RESPIMAT 2.5 MCG/ACT IN AERS: 2.0000 | INHALATION_SPRAY | Freq: Every day | RESPIRATORY_TRACT | 3 refills | Status: DC

## 2022-08-24 NOTE — Telephone Encounter (Signed)
Rx for pt's Spiriva has been sent to preferred pharmacy for pt.  Called and spoke with pt letting him know this had been done and he verbalized understanding.   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 Pt said that he will occasionally cough up some dark mucus. Pt is requesting an abx to be sent in before symptoms do get worse.  MR, please advise on this for pt.

## 2022-08-24 NOTE — Telephone Encounter (Signed)
Spoke with pt and reviewed advise given by [name] along with medication instructions. Pt stated understanding and no questions. Med order placed. Nothing further needed at this time.

## 2022-08-24 NOTE — Telephone Encounter (Signed)
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

## 2022-08-26 ENCOUNTER — Other Ambulatory Visit (HOSPITAL_COMMUNITY): Payer: Self-pay

## 2022-08-26 DIAGNOSIS — M546 Pain in thoracic spine: Secondary | ICD-10-CM | POA: Diagnosis not present

## 2022-08-26 DIAGNOSIS — M461 Sacroiliitis, not elsewhere classified: Secondary | ICD-10-CM | POA: Diagnosis not present

## 2022-08-26 DIAGNOSIS — M62838 Other muscle spasm: Secondary | ICD-10-CM | POA: Diagnosis not present

## 2022-08-26 DIAGNOSIS — M9902 Segmental and somatic dysfunction of thoracic region: Secondary | ICD-10-CM | POA: Diagnosis not present

## 2022-08-26 DIAGNOSIS — M9904 Segmental and somatic dysfunction of sacral region: Secondary | ICD-10-CM | POA: Diagnosis not present

## 2022-08-26 DIAGNOSIS — M5459 Other low back pain: Secondary | ICD-10-CM | POA: Diagnosis not present

## 2022-08-26 DIAGNOSIS — M9903 Segmental and somatic dysfunction of lumbar region: Secondary | ICD-10-CM | POA: Diagnosis not present

## 2022-08-28 ENCOUNTER — Other Ambulatory Visit (HOSPITAL_COMMUNITY): Payer: Self-pay

## 2022-09-15 ENCOUNTER — Ambulatory Visit: Payer: 59 | Attending: Internal Medicine | Admitting: Internal Medicine

## 2022-09-15 VITALS — BP 130/78 | HR 78 | Temp 97.8°F | Ht 71.0 in | Wt 261.0 lb

## 2022-09-15 DIAGNOSIS — Z6836 Body mass index (BMI) 36.0-36.9, adult: Secondary | ICD-10-CM

## 2022-09-15 DIAGNOSIS — R4586 Emotional lability: Secondary | ICD-10-CM | POA: Diagnosis not present

## 2022-09-15 DIAGNOSIS — J8410 Pulmonary fibrosis, unspecified: Secondary | ICD-10-CM | POA: Diagnosis not present

## 2022-09-15 DIAGNOSIS — I2510 Atherosclerotic heart disease of native coronary artery without angina pectoris: Secondary | ICD-10-CM | POA: Diagnosis not present

## 2022-09-15 DIAGNOSIS — R69 Illness, unspecified: Secondary | ICD-10-CM | POA: Diagnosis not present

## 2022-09-15 DIAGNOSIS — Z5320 Procedure and treatment not carried out because of patient's decision for unspecified reasons: Secondary | ICD-10-CM

## 2022-09-15 MED ORDER — ESCITALOPRAM OXALATE 20 MG PO TABS: 20.0000 mg | ORAL_TABLET | Freq: Every day | ORAL | 2 refills | Status: DC

## 2022-09-15 NOTE — Patient Instructions (Signed)
I recommend that you take a baby aspirin daily given your history of heart disease.

## 2022-09-15 NOTE — Progress Notes (Signed)
Patient ID: Matthew [name], male    DOB: [birth year]  MRN: [id number]  CC: Obesity (F/u. Med refill. Neoma Laming received flu  vax this season.)   Subjective: Matthew [name] is a 56 y.o. male who presents for chronic ds management His concerns today include:  Patient with history of CAD with 2 stent (pt has declined statin therapy),  HL, former smoker ( quit 11/2019), emphysema on CT with normal PFTs 01/2020, COVID pneumonia with resulting chronic hypoxia req home O2 (09/2020) pulmonary fibrosis, hemoptysis, mood swings on Lexapro  Wgh:  reports loss 10 lbs but gained over Thxgivig holiday. Plans to work on getting the 10 lbs off. Wgh stable compared to when last seen 02/2022  CAD:  No CP/SOB/palpitations.  Declines statin. Takes ASA only if he has a headache; should be on daily ASA  Pul fibrosis/Emphysema:  has f/u CT scan of chest with [facility] for early next yr Uses Spiriva PRN.  Uses 1-2x/wk.  SOB only if he tries to push himself too much Has O2 at home to use PRN.  Has not had to use it in a while  Mood swings:  doing good on Lexapro.  Needs RF.  Blood pressure noted to be elevated again today.  Patient states that he checks his blood pressure at home once a week.  Range has been around 110/70-80.  Request to have ears flushed.  Thinks he has wax buildup.  HM:  Had flu shot 07/25/2022 at [place].  Declines Shingles vaccine.  I still have not received a report from Dr. Ronalee Red for c-scope.  Thinks he had one  2016 or 2017.  Patient Active Problem List   Diagnosis Date Noted   Pulmonary emphysema, unspecified emphysema type ([place]) 10/17/2021   Essential hypertension 10/17/2021   Statin declined 10/17/2021   Mood swings 04/24/2021   Elevated blood pressure reading 04/24/2021   Obesity (BMI 30.0-34.9) 04/24/2021   Acute respiratory failure with hypoxia ([place]) 09/26/2020   CAD (coronary artery disease) 09/26/2020   COVID-19 virus infection 09/26/2020   Tobacco abuse  12/04/2019   Respiratory failure ([place])    Hemoptysis 11/17/2019     Current Outpatient Medications on File Prior to Visit  Medication Sig Dispense Refill   Aspirin 81 MG CAPS Aspirin 81 mg     Tiotropium Bromide Monohydrate (SPIRIVA RESPIMAT) 2.5 MCG/ACT AERS Inhale 2 puffs into the lungs daily. 12 g 3   vitamin B-12 (CYANOCOBALAMIN) 1000 MCG tablet Take 5,000 mcg by mouth daily.     Vitamin D, Cholecalciferol, 10 MCG (400 UNIT) CAPS Take 400 Units by mouth daily.     vitamin E 1000 UNIT capsule Take 1,000 Units by mouth daily.     albuterol (VENTOLIN HFA) 108 (90 Base) MCG/ACT inhaler INHALE 2 PUFFS INTO THE LUNGS EVERY SIX HOURS AS NEEDED FOR WHEEZING OR SHORTNESS OF BREATH. (Patient not taking: Reported on 04/24/2021) 18 g 2   COVID-19 mRNA vaccine 2023-2024 (COMIRNATY) SUSP injection Inject 0.3 mLs into the muscle. 0.3 mL 0   No current facility-administered medications on file prior to visit.    Allergies  Allergen Reactions   Bee Venom    Codeine Nausea And Vomiting   Onion Swelling    "thoart swells up"    Social History   Socioeconomic History   Marital status: Married    Spouse name: Not on file   Number of children: Not on file   Years of education: Not on file   Highest education level:  Not on file  Occupational History   Not on file  Tobacco Use   Smoking status: Former    Packs/day: 2.00    Years: 39.00    Total pack years: 78.00    Types: Cigarettes    Start date: 81    Quit date: 11/17/2019    Years since quitting: 2.8   Smokeless tobacco: Never   Tobacco comments:    since age 6  Vaping Use   Vaping Use: Never used  Substance and Sexual Activity   Alcohol use: Yes    Comment: seldom   Drug use: No   Sexual activity: Yes  Other Topics Concern   Not on file  Social History Narrative   Not on file   Social Determinants of Health   Financial Resource Strain: Not on file  Food Insecurity: Not on file  Transportation Needs: Not on file   Physical Activity: Not on file  Stress: Not on file  Social Connections: Not on file  Intimate Partner Violence: Not on file    Family History  Problem Relation Age of Onset   Cancer Mother    Cancer Father     Past Surgical History:  Procedure Laterality Date   CARDIAC CATHETERIZATION  12-09-2009  dr Ilda Foil   abnormal stress test (12-05-2009) widely patent coronaries present w/ evidence of diffuse atherosclerotic disease/  widely patent stented segments in the proximal and mid segment RCA/  LVEF 65%   CORONARY ANGIOPLASTY WITH STENT PLACEMENT  12-28-2008  dr berry   PCI and stenting of the proxmial and mid RCA using cutting balloon atherectomy (x2 DES, Xience)/  LVEF >60% w/ normal wall motion   ESOPHAGOGASTRODUODENOSCOPY N/A 11/17/2019   Procedure: ESOPHAGOGASTRODUODENOSCOPY (EGD);  Surgeon: Carol Ada, MD;  Location: [facility];  Service: Gastroenterology;  Laterality: N/A;   KNEE ARTHROSCOPY W/ MENISCECTOMY Right 2002   NEUROPLASTY / TRANSPOSITION ULNAR NERVE AT ELBOW Right 10/2014   dr Bobette Mo BURSECTOMY Left 05/06/2017   Procedure: LEFT ELBOW OLECRANON BURSA EXCISION;  Surgeon: Iran Planas, MD;  Location: [facility];  Service: Orthopedics;  Laterality: Left;   ORIF RIGHT ANKLE FX  2006   TONSILLECTOMY  age 24   TRANSTHORACIC ECHOCARDIOGRAM  03/30/2016   mild concentric LVH, ef 50-55%/  trivial TR    ROS: Review of Systems Negative except as stated above  PHYSICAL EXAM: BP 130/78   Pulse 78   Temp 97.8 F (36.6 C) (Oral)   Ht '5\' 11"'$  (1.803 m)   Wt 261 lb (118.4 kg)   SpO2 93%   BMI 36.40 kg/m   Wt Readings from Last 3 Encounters:  09/15/22 261 lb (118.4 kg)  02/16/22 262 lb 4.8 oz (119 kg)  11/20/21 261 lb 3.2 oz (118.5 kg)    Physical Exam   General appearance - alert, well appearing, obese older male and in no distress Mental status - normal mood, behavior, speech, dress, motor activity, and thought processes Neck -  supple, no significant adenopathy Ears: no significant wax build up in ears.  Canal and membranes within nl range Chest - clear to auscultation, no wheezes, rales or rhonchi, symmetric air entry Heart - normal rate, regular rhythm, normal S1, S2, no murmurs, rubs, clicks or gallops Extremities - peripheral pulses normal, no pedal edema, no clubbing or cyanosis     Latest Ref Rng & Units 10/20/2021   10:28 AM 12/10/2020   11:45 AM 11/09/2020    4:52 AM  CMP  Glucose 70 - 99 mg/dL 112  117  118   BUN 6 - 24 mg/dL '16  10  12   '$ Creatinine 0.76 - 1.27 mg/dL 1.14  0.74  0.91   Sodium 134 - 144 mmol/L 137  134  136   Potassium 3.5 - 5.2 mmol/L 4.5  4.2  4.1   Chloride 96 - 106 mmol/L 99  99  98   CO2 20 - 29 mmol/L '24  26  28   '$ Calcium 8.7 - 10.2 mg/dL 9.6  10.1  8.6   Total Protein 6.0 - 8.5 g/dL 7.2  8.5    8.5  7.9   Total Bilirubin 0.0 - 1.2 mg/dL 0.3  0.5    0.5  0.8   Alkaline Phos 44 - 121 IU/L 118  107    107  102   AST 0 - 40 IU/L '17  28    28  20   '$ ALT 0 - 44 IU/L 21  36    36  23    Lipid Panel     Component Value Date/Time   TRIG 197 (H) 09/27/2020 0023    CBC    Component Value Date/Time   WBC 9.9 10/20/2021 1028   WBC 15.3 (H) 01/01/2021 1048   RBC 5.08 10/20/2021 1028   RBC 4.12 (L) 01/01/2021 1048   HGB 15.6 10/20/2021 1028   HCT 45.5 10/20/2021 1028   PLT 244 10/20/2021 1028   MCV 90 10/20/2021 1028   MCH 30.7 10/20/2021 1028   MCH 30.3 11/09/2020 0452   MCHC 34.3 10/20/2021 1028   MCHC 33.8 01/01/2021 1048   RDW 12.6 10/20/2021 1028   LYMPHSABS 1.8 12/10/2020 1145   MONOABS 0.9 12/10/2020 1145   EOSABS 0.1 12/10/2020 1145   BASOSABS 0.1 12/10/2020 1145    ASSESSMENT AND PLAN: 1. Class 2 severe obesity due to excess calories with serious comorbidity and body mass index (BMI) of 36.0 to 36.9 in adult [facility]) -discussed and encouraged healthy eating habits Encourage him to be mindful of portion sizes  2. Mood swings stable - escitalopram (LEXAPRO) 20  MG tablet; Take 1 tablet (20 mg total) by mouth daily.  Dispense: 90 tablet; Refill: 2  3. Coronary artery disease involving native coronary artery of native heart without angina pectoris stable Ideally should be on statin therapy and daily aspirin.  Patient declined statin therapy.  He will try to take the aspirin daily. - CBC - Comprehensive metabolic panel - Lipid panel  4. Pulmonary fibrosis (HCC) Stable on Spiriva as needed.  She is scheduled for follow-up CT scan next month.  5. Statin declined See #3 above.  Will have him sign release to get c-scope report   Patient was given the opportunity to ask questions.  Patient verbalized understanding of the plan and was able to repeat key elements of the plan.   This documentation was completed using Radio producer.  Any transcriptional errors are unintentional.  Orders Placed This Encounter  Procedures   CBC   Comprehensive metabolic panel   Lipid panel     Requested Prescriptions   Signed Prescriptions Disp Refills   escitalopram (LEXAPRO) 20 MG tablet 90 tablet 2    Sig: Take 1 tablet (20 mg total) by mouth daily.    Return in about 6 months (around 03/17/2023) for Sign release to get colonoscopy report from Dr. Ronalee Red.  Karle Plumber, MD, FACP

## 2022-09-16 ENCOUNTER — Telehealth: Payer: Self-pay | Admitting: Internal Medicine

## 2022-09-16 LAB — LIPID PANEL
Chol/HDL Ratio: 5.6 ratio — ABNORMAL HIGH (ref 0.0–5.0)
Cholesterol, Total: 184 mg/dL (ref 100–199)
HDL: 33 mg/dL — ABNORMAL LOW (ref >39)
LDL Chol Calc (NIH): 119 mg/dL — ABNORMAL HIGH (ref 0–99)
Triglycerides: 179 mg/dL — ABNORMAL HIGH (ref 0–149)
VLDL Cholesterol Cal: 32 mg/dL (ref 5–40)

## 2022-09-16 LAB — CBC
Hematocrit: 48.1 % (ref 37.5–51.0)
Hemoglobin: 16.4 g/dL (ref 13.0–17.7)
MCH: 30.2 pg (ref 26.6–33.0)
MCHC: 34.1 g/dL (ref 31.5–35.7)
MCV: 89 fL (ref 79–97)
Platelets: 233 10*3/uL (ref 150–450)
RBC: 5.43 x10E6/uL (ref 4.14–5.80)
RDW: 12.9 % (ref 11.6–15.4)
WBC: 10.6 10*3/uL (ref 3.4–10.8)

## 2022-09-16 LAB — COMPREHENSIVE METABOLIC PANEL
ALT: 22 IU/L (ref 0–44)
AST: 20 IU/L (ref 0–40)
Albumin/Globulin Ratio: 1.8 (ref 1.2–2.2)
Albumin: 4.8 g/dL (ref 3.8–4.9)
Alkaline Phosphatase: 127 IU/L — ABNORMAL HIGH (ref 44–121)
BUN/Creatinine Ratio: 11 (ref 9–20)
BUN: 13 mg/dL (ref 6–24)
Bilirubin Total: 0.3 mg/dL (ref 0.0–1.2)
CO2: 25 mmol/L (ref 20–29)
Calcium: 9.7 mg/dL (ref 8.7–10.2)
Chloride: 98 mmol/L (ref 96–106)
Creatinine, Ser: 1.21 mg/dL (ref 0.76–1.27)
Globulin, Total: 2.7 g/dL (ref 1.5–4.5)
Glucose: 95 mg/dL (ref 70–99)
Potassium: 5 mmol/L (ref 3.5–5.2)
Sodium: 138 mmol/L (ref 134–144)
Total Protein: 7.5 g/dL (ref 6.0–8.5)
eGFR: 69 mL/min/{1.73_m2} (ref >59)

## 2022-09-16 NOTE — Telephone Encounter (Signed)
Phone call placed to patient today to go over lab results. Advised that kidney function is okay. ALT elevation in alkaline phosphatase level at 127.  Advised that we will plan to repeat it on subsequent visit. Cell count normal. Over lipid profile with him.  LDL cholesterol is 119 with goal being less than 70 given his history of CAD.  HDL cholesterol is lower than we would like it to be.  I advised starting cholesterol-lowering medication.  Patient declines stating that he will work on it through his diet and using vinegar.

## 2022-10-22 DIAGNOSIS — R200 Anesthesia of skin: Secondary | ICD-10-CM | POA: Diagnosis not present

## 2022-10-22 DIAGNOSIS — G5622 Lesion of ulnar nerve, left upper limb: Secondary | ICD-10-CM | POA: Diagnosis not present

## 2022-10-26 DIAGNOSIS — G609 Hereditary and idiopathic neuropathy, unspecified: Secondary | ICD-10-CM | POA: Diagnosis not present

## 2022-10-26 DIAGNOSIS — G5602 Carpal tunnel syndrome, left upper limb: Secondary | ICD-10-CM | POA: Diagnosis not present

## 2022-11-03 DIAGNOSIS — G5622 Lesion of ulnar nerve, left upper limb: Secondary | ICD-10-CM | POA: Diagnosis not present

## 2022-11-03 DIAGNOSIS — G5602 Carpal tunnel syndrome, left upper limb: Secondary | ICD-10-CM | POA: Diagnosis not present

## 2022-11-03 DIAGNOSIS — R200 Anesthesia of skin: Secondary | ICD-10-CM | POA: Diagnosis not present

## 2022-11-03 DIAGNOSIS — G609 Hereditary and idiopathic neuropathy, unspecified: Secondary | ICD-10-CM | POA: Diagnosis not present

## 2022-11-06 DIAGNOSIS — G5602 Carpal tunnel syndrome, left upper limb: Secondary | ICD-10-CM | POA: Diagnosis not present

## 2022-11-12 ENCOUNTER — Other Ambulatory Visit: Payer: Self-pay | Admitting: Internal Medicine

## 2022-11-16 ENCOUNTER — Ambulatory Visit
Admission: RE | Admit: 2022-11-16 | Discharge: 2022-11-16 | Disposition: A | Discharge status: Home / Self Care | Payer: 59 | Source: Ambulatory Visit | Attending: Internal Medicine | Admitting: Internal Medicine

## 2022-11-16 ENCOUNTER — Telehealth: Payer: Self-pay | Admitting: Internal Medicine

## 2022-11-16 DIAGNOSIS — N2889 Other specified disorders of kidney and ureter: Secondary | ICD-10-CM

## 2022-11-16 DIAGNOSIS — U099 Post covid-19 condition, unspecified: Secondary | ICD-10-CM

## 2022-11-16 DIAGNOSIS — J439 Emphysema, unspecified: Secondary | ICD-10-CM

## 2022-11-16 DIAGNOSIS — Z01812 Encounter for preprocedural laboratory examination: Secondary | ICD-10-CM

## 2022-11-16 NOTE — Telephone Encounter (Signed)
Will await response from MR in regards to timing of CT chest.  Attempted to call pt to let him know info per [facility] but unable to reach. Left message for him to return call.

## 2022-11-16 NOTE — Telephone Encounter (Signed)
Received call report from [facility] with [facility] on patient's HRCT done on 11/16/22. Katie, please review the result/impression copied below:  IMPRESSION: 1. Solid 1.0 cm nodule located within a cystic focus in the posterior left upper lobe, previously having the appearance of patchy debris on 11/19/2021 CT. Findings are indeterminate for a solid pulmonary nodule versus small mycetoma. Suggest attention on follow-up noncontrast chest CT in 3 months in this high risk patient. 2. Partially visualized indeterminate large heterogeneous hypodense 10.9 cm upper right renal mass, not definitely changed. MRI (preferred) or CT abdomen without and with IV contrast suggested at this time for further evaluation. 3. Diffuse mild-to-moderate patchy peribronchovascular and subpleural reticulation and ground-glass opacity with associated mild architectural distortion and scattered mild traction bronchiolectasis. No frank honeycombing. The ground-glass opacities have slightly increased in the interval. The other findings have not appreciably progressed. Favor postinflammatory fibrosis/fibrotic NSIP pattern. Findings are suggestive of an alternative diagnosis (not UIP) per consensus guidelines: Diagnosis of Idiopathic Pulmonary Fibrosis: An Official ATS/ERS/JRS/ALAT Clinical Practice Guideline. [employer], Iss 5, [phone], Jun 12 2017. 4. Three-vessel coronary atherosclerosis. 5. Small hiatal hernia. 6. Aortic Atherosclerosis (ICD10-I70.0) and Emphysema (ICD10-J43.9).  Please advise, thank you.   Routing to Byron for review with MR working nights but also routing to MR as an Pharmacist, hospital.

## 2022-11-16 NOTE — Telephone Encounter (Signed)
His ILD appears to have progressed. He also has an area in the LUL, which needs close follow up. I would wait on MR's recommendations for follow up CT chest imaging timing.  He should have further evaluation completed of the mass on his right kidney. He will need to have BMET before CT abd/pelvis with and without IV contrast. He can come here to have that drawn and if kidney function nl, we can place orders for this.  If he doesn't have a cardiologist, we should place referral. He has evidence of CAD and plaque buildup in his arteries on his imaging so would benefit from evaluation with them with risk stratification. Thanks.

## 2022-11-17 DIAGNOSIS — G5602 Carpal tunnel syndrome, left upper limb: Secondary | ICD-10-CM | POA: Diagnosis not present

## 2022-11-17 NOTE — Telephone Encounter (Signed)
I spoke with the pt and notified of response per Katie  Pt upset that we called him prematurely, before MR has reviewed anything  He agreed to have the CT abd/pelvis so I placed the order for this   MR- please advise on the CT Chest and when it will need to be f/u

## 2022-11-24 ENCOUNTER — Other Ambulatory Visit: Payer: 59

## 2022-11-25 NOTE — Telephone Encounter (Signed)
I can understand his frustration but we will work together as a team.  And is really important to get important results out fast.  I trust Katie Cobb immensely.  Please let him know that.  I will be seeing him on 12/03/2022 and go over things with him.

## 2022-12-02 ENCOUNTER — Other Ambulatory Visit (HOSPITAL_BASED_OUTPATIENT_CLINIC_OR_DEPARTMENT_OTHER): Payer: Self-pay

## 2022-12-02 DIAGNOSIS — J439 Emphysema, unspecified: Secondary | ICD-10-CM

## 2022-12-02 DIAGNOSIS — R0609 Other forms of dyspnea: Secondary | ICD-10-CM

## 2022-12-03 ENCOUNTER — Ambulatory Visit
Admission: RE | Admit: 2022-12-03 | Discharge: 2022-12-03 | Disposition: A | Discharge status: Home / Self Care | Payer: 59 | Source: Ambulatory Visit | Attending: Internal Medicine | Admitting: Internal Medicine

## 2022-12-03 ENCOUNTER — Encounter: Payer: Self-pay | Admitting: Internal Medicine

## 2022-12-03 ENCOUNTER — Ambulatory Visit (INDEPENDENT_AMBULATORY_CARE_PROVIDER_SITE_OTHER): Payer: 59 | Admitting: Internal Medicine

## 2022-12-03 VITALS — BP 120/70 | HR 80 | Temp 97.9°F | Ht 71.0 in | Wt 262.8 lb

## 2022-12-03 DIAGNOSIS — J439 Emphysema, unspecified: Secondary | ICD-10-CM | POA: Diagnosis not present

## 2022-12-03 DIAGNOSIS — Z01812 Encounter for preprocedural laboratory examination: Secondary | ICD-10-CM | POA: Diagnosis not present

## 2022-12-03 DIAGNOSIS — U099 Post covid-19 condition, unspecified: Secondary | ICD-10-CM

## 2022-12-03 DIAGNOSIS — I2510 Atherosclerotic heart disease of native coronary artery without angina pectoris: Secondary | ICD-10-CM | POA: Diagnosis not present

## 2022-12-03 DIAGNOSIS — N2889 Other specified disorders of kidney and ureter: Secondary | ICD-10-CM

## 2022-12-03 DIAGNOSIS — J8410 Pulmonary fibrosis, unspecified: Secondary | ICD-10-CM

## 2022-12-03 DIAGNOSIS — R911 Solitary pulmonary nodule: Secondary | ICD-10-CM | POA: Diagnosis not present

## 2022-12-03 DIAGNOSIS — R0609 Other forms of dyspnea: Secondary | ICD-10-CM

## 2022-12-03 DIAGNOSIS — N281 Cyst of kidney, acquired: Secondary | ICD-10-CM | POA: Diagnosis not present

## 2022-12-03 DIAGNOSIS — K429 Umbilical hernia without obstruction or gangrene: Secondary | ICD-10-CM | POA: Diagnosis not present

## 2022-12-03 DIAGNOSIS — D1803 Hemangioma of intra-abdominal structures: Secondary | ICD-10-CM | POA: Diagnosis not present

## 2022-12-03 LAB — BASIC METABOLIC PANEL
BUN: 10 mg/dL (ref 6–23)
CO2: 29 mEq/L (ref 19–32)
Calcium: 9.5 mg/dL (ref 8.4–10.5)
Chloride: 100 mEq/L (ref 96–112)
Creatinine, Ser: 1.21 mg/dL (ref 0.40–1.50)
GFR: 66.1 mL/min (ref >60.00)
Glucose, Bld: 82 mg/dL (ref 70–99)
Potassium: 3.9 mEq/L (ref 3.5–5.1)
Sodium: 136 mEq/L (ref 135–145)

## 2022-12-03 MED ORDER — IOPAMIDOL (ISOVUE-370) INJECTION 76%
80.0000 mL | Freq: Once | INTRAVENOUS | Status: AC | PRN
  Administered 2022-12-03: 80 mL via INTRAVENOUS

## 2022-12-03 NOTE — Progress Notes (Signed)
Full PFT performed today. °

## 2022-12-03 NOTE — Patient Instructions (Signed)
Full PFT performed today. °

## 2022-12-03 NOTE — Patient Instructions (Addendum)
ICD-10-CM   1. COVID-19 long hauler manifesting chronic dyspnea  R06.09    U09.9     2. 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 Pulmonary emphysema, unspecified emphysema type ([place])  J43.9     4. Right kidney mass  N28.89     5. Coronary artery disease involving native coronary artery of native heart without angina pectoris  I25.10     6. 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) Pulmonary emphysema, unspecified emphysema type ([place])  = PFT worse Feb 2024 compared to 2021 but curretly at mild-mod level of severity  - decline is due to severe covid but since covid fibrosis improved but still present and curently stable  Plan  - check alpha 1 AT phenotype  - cotninue spiriva scheduled    Lung nodule 1cm Feb 2024  Plan  - repeat CT chest without contrast in early May 2024   Right kidney mass 10cm feb 2024  Pla  -refer urology urgent - order PET Scan next few weeks  Coronary artery disease involving native coronary artery of native heart without angina pectoris Coronary artery calcification seen on CT scan Hx of stent in 2010  Plan  - refer DR Quay Burow    Follow-up - 3 months do CT without contrast and return to see [name] in 3 mmonths

## 2022-12-03 NOTE — Progress Notes (Signed)
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 increased in the interval. The other findings have not appreciably progressed. No significant lobular air trapping or definite tracheobronchomalacia on the expiration sequence.   Upper abdomen: Small hiatal hernia. Partially visualized large heterogeneous hypodense 10.9 x 10.0 cm upper right renal mass (series 2/image 140), not substantially changed.   Musculoskeletal: No aggressive appearing focal osseous lesions. Minimal thoracic spondylosis.   IMPRESSION: 1. Solid 1.0 cm nodule located within a cystic focus in the posterior left upper lobe, previously having the appearance of patchy debris on 11/19/2021 CT. Findings are indeterminate for a solid pulmonary nodule versus small mycetoma. Suggest attention on follow-up noncontrast  chest CT in 3 months in this high risk patient. 2. Partially visualized indeterminate large heterogeneous hypodense 10.9 cm upper right renal mass, not definitely changed. MRI (preferred) or CT abdomen without and with IV contrast suggested at this time for further evaluation. 3. Diffuse mild-to-moderate patchy peribronchovascular and subpleural reticulation and ground-glass opacity with associated mild architectural distortion and scattered mild traction bronchiolectasis. No frank honeycombing. The ground-glass opacities have slightly increased in the interval. The other findings have not appreciably progressed. Favor postinflammatory fibrosis/fibrotic NSIP pattern. Findings are suggestive of an alternative diagnosis (not UIP) per consensus guidelines: Diagnosis of Idiopathic Pulmonary Fibrosis: An Official ATS/ERS/JRS/ALAT Clinical Practice Guideline. [place], Iss 5, [phone], Jun 12 2017. 4. Three-vessel coronary atherosclerosis. 5. Small hiatal hernia. 6. Aortic Atherosclerosis (ICD10-I70.0) and Emphysema (ICD10-J43.9).   These results will be called to the ordering clinician or representative by the Radiologist Assistant, and communication documented in the PACS or [employer].     Electronically Signed   By: Ilona Sorrel M.D.   On: 11/16/2022 13:45    CT ABDOMEN W WO CONTRAST  Result Date: 12/03/2022 CLINICAL DATA:  A 57 year old male presents for evaluation of a RIGHT renal mass. EXAM: CT ABDOMEN WITHOUT AND WITH CONTRAST TECHNIQUE: Multidetector CT imaging of the abdomen was performed following the standard protocol before and following the bolus administration of intravenous contrast. RADIATION DOSE REDUCTION: This exam was performed according to the departmental dose-optimization program which includes automated exposure control, adjustment of the mA and/or kV according to patient size and/or use of iterative reconstruction technique.  CONTRAST:  68m ISOVUE-370 IOPAMIDOL (ISOVUE-370) INJECTION 76% COMPARISON:  Chest imaging from 2021. No prior abdominal imaging is available for comparison. FINDINGS: Lower chest: Signs of interstitial lung disease better displayed on prior chest imaging. No pleural effusion or dense basilar consolidative process. Hepatobiliary: Numerous areas of hyperenhancement in the liver, for instance in area measuring 11 mm in the LEFT hepatic lobe. Caudate lesion with peripheral and nodular discontinuous enhancement measuring 2.3 cm on image 37/6 shows progressive filling. 12 mm hepatic subsegment V/VIII lesion also hypervascular but shown to match blood pool on delayed phase imaging. Small transient attenuation differences elsewhere. No overtly suspicious hepatic lesion. No pericholecystic stranding. No biliary duct dilation. Pancreas: Normal, without mass, inflammation  or ductal dilatation. Spleen: Normal. Adrenals/Urinary Tract: Adrenal glands are normal. Upper pole cystic lesion associated with the RIGHT kidney shows thick-walled appearance measuring 10 x 11 cm. No stranding adjacent to this lesion. Lesion incompletely imaged on prior imaging. Nodular appearing area on delayed phase imaging more apparent on post-contrast imaging than on noncontrast imaging along the lateral margin measuring 6.3 x 1.9 cm. Enhancement cannot be confirmed in this area based on density measurements or visual assessment. No additional lesions with suspicious features in the kidneys. No hydronephrosis. No perinephric stranding. Stomach/Bowel: No acute gastrointestinal process to the extent evaluated on this abdominal CT. Vascular/Lymphatic: Atherosclerotic changes are moderate with calcified and noncalcified plaque in the abdominal aorta. No aneurysmal dilation. Other: Small fat containing umbilical hernia.  No ascites. Musculoskeletal: Spinal degenerative changes. No acute or destructive bone process. IMPRESSION: 1. Cystic lesion arising from  the upper pole of the RIGHT kidney is thick walled and shows a nodular area along its lateral margin that is equivocal in terms of enhancement characteristics by CT. MRI is suggested to assess for any focal nodular area of enhancement that might raise concern for indolent cystic renal neoplasm. This is very likely a large hemorrhagic cyst with retracted clot but shows limited characterization due to factors outlined above on CT. This lesion measures greater than 10 cm, perhaps minimally enlarged since February of 2023. 2. Scattered hepatic hemangiomas. 3. Signs of interstitial lung disease better displayed on prior chest imaging. 4. Small fat containing umbilical hernia. 5. Aortic atherosclerosis. Aortic Atherosclerosis (ICD10-I70.0). Electronically Signed   By: Zetta Bills M.D.   On: 12/03/2022 14:50       has a past medical history of Coronary artery disease (per pt last cardiology visit 2011 (found documentation 12-05-2009 w/ dr Leonia Reeves with [facility] in epic)/  currently pt is followed by pcp), GERD (gastroesophageal reflux disease), Hyperlipidemia, Olecranon bursitis of left elbow, Respiratory failure ([place]), S/P right coronary artery (RCA) stent placement (12/28/2008), and Wears dentures.   reports that he quit smoking about 3 years ago. His smoking use included cigarettes. He started smoking about 45 years ago. He has a 78.00 pack-year smoking history. He has never used smokeless tobacco.  Past Surgical History:  Procedure Laterality Date   CARDIAC CATHETERIZATION  12-09-2009  dr Ilda Foil   abnormal stress test (12-05-2009) widely patent coronaries present w/ evidence of diffuse atherosclerotic disease/  widely patent stented segments in the proximal and mid segment RCA/  LVEF 65%   CORONARY ANGIOPLASTY WITH STENT PLACEMENT  12-28-2008  dr berry   PCI and stenting of the proxmial and mid RCA using cutting balloon atherectomy (x2 DES, Xience)/  LVEF >60% w/ normal wall motion    ESOPHAGOGASTRODUODENOSCOPY N/A 11/17/2019   Procedure: ESOPHAGOGASTRODUODENOSCOPY (EGD);  Surgeon: Carol Ada, MD;  Location: [facility];  Service: Gastroenterology;  Laterality: N/A;   KNEE ARTHROSCOPY W/ MENISCECTOMY Right 2002   NEUROPLASTY / TRANSPOSITION ULNAR NERVE AT ELBOW Right 10/2014   dr Bobette Mo BURSECTOMY Left 05/06/2017   Procedure: LEFT ELBOW OLECRANON BURSA EXCISION;  Surgeon: Iran Planas, MD;  Location: [facility];  Service: Orthopedics;  Laterality: Left;   ORIF RIGHT ANKLE FX  2006   TONSILLECTOMY  age 9   TRANSTHORACIC ECHOCARDIOGRAM  03/30/2016   mild concentric LVH, ef 50-55%/  trivial TR    Allergies  Allergen Reactions   Bee Venom    Codeine Nausea And Vomiting   Onion Swelling    "thoart swells up"  Immunization History  Administered Date(s) Administered   COVID-19, mRNA, vaccine(Comirnaty)12 years and older 07/27/2022   DTaP 01/07/2015   Influenza-Unspecified 08/12/2021   PFIZER(Purple Top)SARS-COV-2 Vaccination 12/25/2020, 01/15/2021   Pneumococcal Polysaccharide-23 11/16/2020    Family History  Problem Relation Age of Onset   Cancer Mother    Cancer Father      Current Outpatient Medications:    Aspirin 81 MG CAPS, Aspirin 81 mg, Disp: , Rfl:    escitalopram (LEXAPRO) 20 MG tablet, Take 1 tablet (20 mg total) by mouth daily., Disp: 90 tablet, Rfl: 2   Tiotropium Bromide Monohydrate (SPIRIVA RESPIMAT) 2.5 MCG/ACT AERS, Inhale 2 puffs into the lungs daily., Disp: 12 g, Rfl: 3   vitamin B-12 (CYANOCOBALAMIN) 1000 MCG tablet, Take 5,000 mcg by mouth daily., Disp: , Rfl:    albuterol (VENTOLIN HFA) 108 (90 Base) MCG/ACT inhaler, INHALE 2 PUFFS INTO THE LUNGS EVERY SIX HOURS AS NEEDED FOR WHEEZING OR SHORTNESS OF BREATH. (Patient not taking: Reported on 04/24/2021), Disp: 18 g, Rfl: 2   COVID-19 mRNA vaccine 2023-2024 (COMIRNATY) SUSP injection, Inject 0.3 mLs into the muscle., Disp: 0.3 mL, Rfl: 0   Vitamin D,  Cholecalciferol, 10 MCG (400 UNIT) CAPS, Take 400 Units by mouth daily. (Patient not taking: Reported on 12/03/2022), Disp: , Rfl:    vitamin E 1000 UNIT capsule, Take 1,000 Units by mouth daily. (Patient not taking: Reported on 12/03/2022), Disp: , Rfl:       Objective:   Vitals:   12/03/22 1105  BP: 120/70  Pulse: 80  Temp: 97.9 F (36.6 C)  TempSrc: Oral  SpO2: 94%  Weight: 262 lb 12.8 oz (119.2 kg)  Height: 5' 11"$  (1.803 m)    Estimated body mass index is 36.65 kg/m as calculated from the following:   Height as of this encounter: 5' 11"$  (1.803 m).   Weight as of this encounter: 262 lb 12.8 oz (119.2 kg).  @WEIGHTCHANGE$ @  [employer]   12/03/22 1105  Weight: 262 lb 12.8 oz (119.2 kg)     Physical Exam   General: No distress. Looks well Neuro: Alert and Oriented x 3. GCS 15. Speech normal Psych: Pleasant Resp:  Barrel Chest - no.  Wheeze - no, Crackles - yes, No overt respiratory distress CVS: Normal heart sounds. Murmurs - no Ext: Stigmata of Connective Tissue Disease - no HEENT: Normal upper airway. PEERL +. No post nasal drip        Assessment:       ICD-10-CM   1. COVID-19 long hauler manifesting chronic dyspnea  R06.09    U09.9     2. 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 Pulmonary emphysema, unspecified emphysema type ([place])  J43.9     4. Right kidney mass  N28.89     5. Coronary artery disease involving native coronary artery of native heart without angina pectoris  I25.10     6. Coronary artery calcification seen on CT scan  I25.10          Plan:     Patient Instructions     ICD-10-CM   1. COVID-19 long hauler manifesting chronic dyspnea  R06.09    U09.9     2. 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 Pulmonary emphysema, unspecified emphysema type ([place])  J43.9     4. Right kidney mass  N28.89     5. Coronary artery disease involving native coronary artery of native heart without angina pectoris  I25.10     6. 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) Pulmonary emphysema, unspecified emphysema type ([place])  = PFT worse Feb 2024 compared to 2021 but curretly at mild-mod level of severity  - decline is due to severe covid but since covid fibrosis improved but still present and curently stable  Plan  - check alpha 1 AT phenotype  - cotninue spiriva scheduled    Lung nodule 1cm Feb 2024  Plan  - repeat CT chest without contrast in early May 2024   Right kidney mass 10cm feb 2024  Pla  -refer urology urgent - order PET Scan next few weeks  Coronary artery disease involving native coronary artery of native heart without angina pectoris Coronary artery calcification seen on CT scan Hx of stent in 2010  Plan  - refer DR Quay Burow    Follow-up - 3 months do CT without contrast and return to see [name] in 3 mmonths  ( Level 05 visit: Estb 40-54 min   visit type: on-site physical face to visit  in total care time and counseling or/and coordination of care by this undersigned MD - Dr Brand Males. This includes one or more of the following on this same day 12/03/2022: pre-charting, chart review, note writing, documentation discussion of test results, diagnostic or treatment recommendations, prognosis, risks and benefits of management options, instructions, education, compliance or risk-factor reduction. It excludes time spent by the [facility] or office staff in the care of the patient. Actual time 40 min)   SIGNATURE    Dr. Brand Males, M.D., F.C.C.P,  Pulmonary and Critical Care Medicine Staff Physician, [place] Director - Interstitial Lung Disease  Program  Pulmonary [place] [facility] [place], [place], [address]  Pager: [phone], If no answer or between  15:00h - 7:00h: call [phone] Telephone: [phone]  11:48 AM 12/03/2022

## 2022-12-04 LAB — PULMONARY FUNCTION TEST
DL/VA % pred: 85 %
DL/VA: 3.62 ml/min/mmHg/L
DLCO cor % pred: 58 %
DLCO cor: 16.86 ml/min/mmHg
DLCO unc % pred: 58 %
DLCO unc: 16.86 ml/min/mmHg
FEF 25-75 Post: 0.72 L/sec
FEF 25-75 Pre: 3.8 L/sec
FEF2575-%Change-Post: -80 %
FEF2575-%Pred-Post: 22 %
FEF2575-%Pred-Pre: 119 %
FEV1-%Change-Post: -43 %
FEV1-%Pred-Post: 45 %
FEV1-%Pred-Pre: 79 %
FEV1-Post: 1.71 L
FEV1-Pre: 3.02 L
FEV1FVC-%Change-Post: -42 %
FEV1FVC-%Pred-Pre: 111 %
FEV6-%Change-Post: -1 %
FEV6-%Pred-Post: 73 %
FEV6-%Pred-Pre: 74 %
FEV6-Post: 3.51 L
FEV6-Pre: 3.57 L
FEV6FVC-%Change-Post: 0 %
FEV6FVC-%Pred-Post: 103 %
FEV6FVC-%Pred-Pre: 104 %
FVC-%Change-Post: 0 %
FVC-%Pred-Post: 70 %
FVC-%Pred-Pre: 71 %
FVC-Post: 3.55 L
Post FEV1/FVC ratio: 48 %
Post FEV6/FVC ratio: 99 %
Pre FEV1/FVC ratio: 85 %
Pre FEV6/FVC Ratio: 100 %
RV % pred: 52 %
RV: 1.19 L
TLC % pred: 65 %
TLC: 4.68 L

## 2022-12-15 LAB — ALPHA-1 ANTITRYPSIN PHENOTYPE: A-1 Antitrypsin, Ser: 134 mg/dL (ref 83–199)

## 2022-12-18 ENCOUNTER — Telehealth: Payer: Self-pay | Admitting: Internal Medicine

## 2022-12-18 DIAGNOSIS — I2510 Atherosclerotic heart disease of native coronary artery without angina pectoris: Secondary | ICD-10-CM

## 2022-12-18 DIAGNOSIS — R911 Solitary pulmonary nodule: Secondary | ICD-10-CM

## 2022-12-18 NOTE — Telephone Encounter (Signed)
Pla  -refer urology urgent - order PET Scan next few weeks  And  Refer Dr. Quay Burow   PT calling saying if we had not gotten back to him about a referral to these other Dr's he should call us. Pls call PT at [phone]

## 2022-12-18 NOTE — Telephone Encounter (Signed)
Order for PET scan was not placed at pt's last visit. I have now gotten this placed.  Referral was placed for pt to be referred to urology and their office will call pt about getting an appt scheduled.  Referral was not placed for pt to be referred to Dr Gwenlyn Found. I have gotten the referral placed. Their office will call pt about getting appt scheduled.    Called and spoke with pt about this letting him know that the cardiology referral had not been placed at last OV but I have placed this now. Stated to him that [facility] will call him to get the PET scheduled. Stated to him that the urgent referral to urology had been placed at last OV. Pt requested me to call him back to leave a VM with their phone number so he could call them to try to get an appt scheduled. Called pt back and left him a VM with the phone number for [facility]. Nothing further needed.

## 2022-12-21 DIAGNOSIS — Z125 Encounter for screening for malignant neoplasm of prostate: Secondary | ICD-10-CM | POA: Diagnosis not present

## 2022-12-21 DIAGNOSIS — N281 Cyst of kidney, acquired: Secondary | ICD-10-CM | POA: Diagnosis not present

## 2022-12-22 ENCOUNTER — Telehealth: Payer: Self-pay | Admitting: Internal Medicine

## 2022-12-22 NOTE — Telephone Encounter (Signed)
PT calling w/two CT appt's on different days. Pls call to advise. Why is one sched for his thighs? His # is [phone]

## 2022-12-22 NOTE — Telephone Encounter (Signed)
Patient is upset about a ct being scheduled and a  PET scan said we are costing him to much money he wants to know why they can't be done at the same place the ct had to be scheduled at [facility] due to insurance the Pet is at [facility] cause they can't do those at [facility]

## 2022-12-23 ENCOUNTER — Encounter: Payer: Self-pay | Admitting: Internal Medicine

## 2022-12-23 DIAGNOSIS — N281 Cyst of kidney, acquired: Secondary | ICD-10-CM | POA: Insufficient documentation

## 2022-12-23 NOTE — Telephone Encounter (Signed)
Called and spoke with patient. He was confused on why he has a CT scan scheduled for 3/26 and PET scheduled for 3/28. I looked at the CT scan and it stated that it wasn't due until May 2024. I advised him I would see if I could get this cancelled and held off until May 2024.   I explained to him that the PET scan was ordered to look at the cyst on his kidney. Per patient, he was seen by Dr. Tresa Moore at [facility] last week and told that the cyst was nothing to worry about. Dr. Tresa Moore recommended following up in a year with no imaging. I advised him I would request a copy of his last OV notes from [facility] for [name] to review.   [facility] and spoke with Larkin Ina in medical records. He will fax a copy of the results to our office. Will give records to MR to review once they have been received.

## 2022-12-28 NOTE — Telephone Encounter (Signed)
Called patient but he did not answer. Left message for him to call us back.  

## 2022-12-28 NOTE — Telephone Encounter (Signed)
Well I waned it to be helpful for urology but if urology does not want it, that is fine. Cancancel PET

## 2022-12-28 NOTE — Telephone Encounter (Signed)
Pt gave a call in regards to getting a update on weather he needs the PET scan or not. Pt did mention he will not be attending the PET Scan due to him being already cleared from Urology specialist. Pls advise Pt of next steps.

## 2022-12-28 NOTE — Telephone Encounter (Signed)
Please advise if patient needs to complete PET scan. Patient has stated he was cleared by Urology and is unsure why he still needs the pet scan

## 2022-12-29 ENCOUNTER — Other Ambulatory Visit: Payer: Self-pay

## 2022-12-29 NOTE — Telephone Encounter (Signed)
Called and spoke with patient. Patient stated that he had his appointment with urology and stated that they told him that he just had a cyst and he shouldn't worry about it. Patient stated he goes back to urology in a year to get it checked again.   Advised patient I would get the PET scan canceled for him.   Nothing further needed.

## 2022-12-29 NOTE — Telephone Encounter (Signed)
PT/ ret Cherina's call. I adv him Dr. Alfonso Patten said OK to cancel PET but he still wants to speak to her. Pls call @ [phone]

## 2023-01-05 ENCOUNTER — Other Ambulatory Visit: Payer: 59

## 2023-01-07 ENCOUNTER — Other Ambulatory Visit (HOSPITAL_COMMUNITY): Payer: 59

## 2023-01-27 ENCOUNTER — Ambulatory Visit: Payer: 59 | Admitting: Cardiovascular Disease

## 2023-02-24 ENCOUNTER — Other Ambulatory Visit: Payer: 59

## 2023-03-17 ENCOUNTER — Ambulatory Visit: Payer: PPO | Attending: Cardiovascular Disease | Admitting: Cardiovascular Disease

## 2023-03-17 ENCOUNTER — Encounter: Payer: Self-pay | Admitting: Cardiovascular Disease

## 2023-03-17 VITALS — BP 138/95 | HR 76 | Ht 71.0 in | Wt 264.0 lb

## 2023-03-17 DIAGNOSIS — E785 Hyperlipidemia, unspecified: Secondary | ICD-10-CM | POA: Diagnosis not present

## 2023-03-17 DIAGNOSIS — Z720 Tobacco use: Secondary | ICD-10-CM | POA: Diagnosis not present

## 2023-03-17 DIAGNOSIS — I10 Essential (primary) hypertension: Secondary | ICD-10-CM

## 2023-03-17 LAB — LIPID PANEL
Chol/HDL Ratio: 6.1 ratio — ABNORMAL HIGH (ref 0.0–5.0)
Cholesterol, Total: 183 mg/dL (ref 100–199)
HDL: 30 mg/dL — ABNORMAL LOW (ref >39)
LDL Chol Calc (NIH): 115 mg/dL — ABNORMAL HIGH (ref 0–99)
Triglycerides: 216 mg/dL — ABNORMAL HIGH (ref 0–149)
VLDL Cholesterol Cal: 38 mg/dL (ref 5–40)

## 2023-03-17 LAB — HEPATIC FUNCTION PANEL
ALT: 21 IU/L (ref 0–44)
AST: 18 IU/L (ref 0–40)
Albumin: 4.3 g/dL (ref 3.8–4.9)
Alkaline Phosphatase: 121 IU/L (ref 44–121)
Bilirubin Total: 0.4 mg/dL (ref 0.0–1.2)
Bilirubin, Direct: 0.13 mg/dL (ref 0.00–0.40)
Total Protein: 7.6 g/dL (ref 6.0–8.5)

## 2023-03-17 NOTE — Progress Notes (Signed)
03/17/2023 [name]   01-15-1966  [id number]  Primary Physician Marcine Matar, MD Primary Cardiologist: Runell Gess MD Nicholes Calamity, [place]  HPI:  Matthew [name] is a 57 y.o. moderately overweight married Caucasian male with no children who was referred by Dr. Marchelle Gearing, his pulmonologist, because of coronary calcification seen on chest CT incidentally.  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  His cardiac risk factors are notable for 80-pack-year tobacco abuse having smoked 2 packs a day and stopped when he got in the hospital on 11/16/2020.  He does have untreated hyperlipidemia.  There is no family history of heart disease.  He does have a history of ischemic heart disease status post cardiac catheterization, PCI drug-eluting stenting by myself 12/28/2008 because of accelerated angina.  This was IVUS guided.  His left system was free of disease.  His LV function was normal back then as it was as recently as 09/28/2020 by 2D echo.  He was hospitalized 09/26/2020 through 11/16/2020 with COVID-pneumonia.  He was never intubated.  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  He apparently has pulmonary fibrosis followed by Dr. Marchelle Gearing.  He is fairly active doing yard work outside.  Does get some dyspnea but denies chest pain.  He did have a CT scan that showed a 10 cm renal mass which is being followed as well.   Current Meds  Medication Sig   albuterol (VENTOLIN HFA) 108 (90 Base) MCG/ACT inhaler INHALE 2 PUFFS INTO THE LUNGS EVERY SIX HOURS AS NEEDED FOR WHEEZING OR SHORTNESS OF BREATH.   Aspirin 81 MG CAPS Aspirin 81 mg   escitalopram (LEXAPRO) 20 MG tablet Take 1 tablet (20 mg total) by mouth daily.   Tiotropium Bromide Monohydrate (SPIRIVA RESPIMAT) 2.5 MCG/ACT AERS Inhale 2 puffs into the lungs daily.    vitamin B-12 (CYANOCOBALAMIN) 1000 MCG tablet Take 5,000 mcg by mouth daily.   Vitamin D, Cholecalciferol, 10 MCG (400 UNIT) CAPS Take 400 Units by mouth daily.   vitamin E 1000 UNIT capsule Take 1,000 Units by mouth daily.     Allergies  Allergen Reactions   Bee Venom    Codeine Nausea And Vomiting   Onion Swelling    "thoart swells up"    Social History   Socioeconomic History   Marital status: Married    Spouse name: Not on file   Number of children: Not on file   Years of education: Not on file   Highest education level: Not on file  Occupational History   Not on file  Tobacco Use   Smoking status: Former    Packs/day: 2.00    Years: 39.00    Additional pack years: 0.00    Total pack years: 78.00    Types: Cigarettes    Start date: 11    Quit date: 11/17/2019    Years since quitting: 3.3   Smokeless tobacco: Never   Tobacco comments:    since age 19  Vaping Use   Vaping Use: Never used  Substance and Sexual Activity   Alcohol use: Yes    Comment: seldom   Drug use: No   Sexual activity: Yes  Other Topics Concern   Not on file  Social History Narrative   Not on file   Social Determinants of Health  Financial Resource Strain: Not on file  Food Insecurity: Not on file  Transportation Needs: Not on file  Physical Activity: Not on file  Stress: Not on file  Social Connections: Not on file  Intimate Partner Violence: Not on file     Review of Systems: General: negative for chills, fever, night sweats or weight changes.  Cardiovascular: negative for chest pain, dyspnea on exertion, edema, orthopnea, palpitations, paroxysmal nocturnal dyspnea or shortness of breath Dermatological: negative for rash Respiratory: negative for cough or wheezing Urologic: negative for hematuria Abdominal: negative for nausea, vomiting, diarrhea, bright red blood per rectum, melena, or hematemesis Neurologic: negative for visual changes, syncope, or dizziness All other  systems reviewed and are otherwise negative except as noted above.    Blood pressure (!) 138/95, pulse 76, height 5\' 11"  (1.803 m), weight 264 lb (119.7 kg), SpO2 96 %.  General appearance: alert and no distress Neck: no adenopathy, no carotid bruit, no JVD, supple, symmetrical, trachea midline, and thyroid not enlarged, symmetric, no tenderness/mass/nodules Lungs: clear to auscultation bilaterally Heart: regular rate and rhythm, S1, S2 normal, no murmur, click, rub or gallop Extremities: extremities normal, atraumatic, no cyanosis or edema Pulses: 2+ and symmetric Skin: Skin color, texture, turgor normal. No rashes or lesions Neurologic: Grossly normal  EKG sinus rhythm at 76 without ST or T wave changes.  Personally reviewed this EKG.  ASSESSMENT AND PLAN:   Tobacco abuse History of CAD status post dominant RCA PCI and stenting which was IVUS guided by myself 12/28/2008.  His left system was free of disease.  I placed a 3.5 x 18 mm long Xience drug-eluting stent along with a 3.5 x 15 mm long Xience hydrophilic stent postdilated with a 4.0 mm vessel.  LV function was normal.  Post procedure he was asymptomatic.  He remains on aspirin.  He denies chest pain.  He did have three-vessel coronary calcification on chest CT.  Essential hypertension History of essential potential blood pressure measured today at 138/95.  He does have an element of "whitecoat hypertension  Hyperlipidemia History of hyperlipidemia not on statin therapy with lipid profile performed 09/15/2022 revealing total cholesterol 184, LDL 119 and HDL of 33, not at goal for secondary prevention.  We will recheck a lipid liver profile today.  I suspect he will require statin therapy.     Runell Gess MD FACP,FACC,FAHA, [facility] 03/17/2023 9:42 AM

## 2023-03-17 NOTE — Assessment & Plan Note (Signed)
History of hyperlipidemia not on statin therapy with lipid profile performed 09/15/2022 revealing total cholesterol 184, LDL 119 and HDL of 33, not at goal for secondary prevention.  We will recheck a lipid liver profile today.  I suspect he will require statin therapy.

## 2023-03-17 NOTE — Assessment & Plan Note (Signed)
History of CAD status post dominant RCA PCI and stenting which was IVUS guided by myself 12/28/2008.  His left system was free of disease.  I placed a 3.5 x 18 mm long Xience drug-eluting stent along with a 3.5 x 15 mm long Xience hydrophilic stent postdilated with a 4.0 mm vessel.  LV function was normal.  Post procedure he was asymptomatic.  He remains on aspirin.  He denies chest pain.  He did have three-vessel coronary calcification on chest CT.

## 2023-03-17 NOTE — Assessment & Plan Note (Signed)
History of essential potential blood pressure measured today at 138/95.  He does have an element of "whitecoat hypertension

## 2023-03-17 NOTE — Patient Instructions (Signed)
Medication Instructions:  Your physician recommends that you continue on your current medications as directed. Please refer to the Current Medication list given to you today.  *If you need a refill on your cardiac medications before your next appointment, please call your pharmacy*   Lab Work: Your physician recommends that you have labs drawn today: Lipid/liver panel  If you have labs (blood work) drawn today and your tests are completely normal, you will receive your results only by: MyChart Message (if you have MyChart) OR A paper copy in the mail If you have any lab test that is abnormal or we need to change your treatment, we will call you to review the results.   Follow-Up: At [facility], you and your health needs are our priority.  As part of our continuing mission to provide you with exceptional heart care, we have created designated Provider Care Teams.  These Care Teams include your primary Cardiologist (physician) and Advanced Practice Providers (APPs -  Physician Assistants and Nurse Practitioners) who all work together to provide you with the care you need, when you need it.  We recommend signing up for the patient portal called "MyChart".  Sign up information is provided on this After Visit Summary.  MyChart is used to connect with patients for Virtual Visits (Telemedicine).  Patients are able to view lab/test results, encounter notes, upcoming appointments, etc.  Non-urgent messages can be sent to your provider as well.   To learn more about what you can do with MyChart, go to [email or web address].    Your next appointment:   12 month(s)  Provider:   Jonathan Berry, MD  

## 2023-03-18 ENCOUNTER — Encounter: Payer: Self-pay | Admitting: Internal Medicine

## 2023-03-18 ENCOUNTER — Ambulatory Visit: Payer: 59 | Attending: Internal Medicine | Admitting: Internal Medicine

## 2023-03-18 VITALS — BP 143/92 | HR 76 | Temp 97.7°F | Ht 71.0 in | Wt 267.0 lb

## 2023-03-18 DIAGNOSIS — E66811 Obesity, class 1: Secondary | ICD-10-CM | POA: Insufficient documentation

## 2023-03-18 DIAGNOSIS — R4586 Emotional lability: Secondary | ICD-10-CM

## 2023-03-18 DIAGNOSIS — J9611 Chronic respiratory failure with hypoxia: Secondary | ICD-10-CM | POA: Diagnosis not present

## 2023-03-18 DIAGNOSIS — J439 Emphysema, unspecified: Secondary | ICD-10-CM | POA: Diagnosis not present

## 2023-03-18 DIAGNOSIS — Z9981 Dependence on supplemental oxygen: Secondary | ICD-10-CM

## 2023-03-18 DIAGNOSIS — E782 Mixed hyperlipidemia: Secondary | ICD-10-CM | POA: Diagnosis not present

## 2023-03-18 DIAGNOSIS — N2889 Other specified disorders of kidney and ureter: Secondary | ICD-10-CM | POA: Diagnosis not present

## 2023-03-18 DIAGNOSIS — I10 Essential (primary) hypertension: Secondary | ICD-10-CM | POA: Diagnosis not present

## 2023-03-18 DIAGNOSIS — Z6837 Body mass index (BMI) 37.0-37.9, adult: Secondary | ICD-10-CM

## 2023-03-18 DIAGNOSIS — R911 Solitary pulmonary nodule: Secondary | ICD-10-CM | POA: Diagnosis not present

## 2023-03-18 DIAGNOSIS — I2510 Atherosclerotic heart disease of native coronary artery without angina pectoris: Secondary | ICD-10-CM | POA: Diagnosis not present

## 2023-03-18 MED ORDER — ESCITALOPRAM OXALATE 20 MG PO TABS: 20.0000 mg | ORAL_TABLET | Freq: Every day | ORAL | 2 refills | Status: DC

## 2023-03-18 NOTE — Patient Instructions (Signed)
I have referred you to [facility].  They will call you with the appointment.  When you walk in your neighborhood, make sure that you take your albuterol inhaler with you.  Healthy Eating, Adult Healthy eating may help you get and keep a healthy body weight, reduce the risk of chronic disease, and live a long and productive life. It is important to follow a healthy eating pattern. Your nutritional and calorie needs should be met mainly by different nutrient-rich foods. What are tips for following this plan? Reading food labels Read labels and choose the following: Reduced or low sodium products. Juices with 100% fruit juice. Foods with low saturated fats (<3 g per serving) and high polyunsaturated and monounsaturated fats. Foods with whole grains, such as whole wheat, cracked wheat, brown rice, and wild rice. Whole grains that are fortified with folic acid. This is recommended for females who are pregnant or who want to become pregnant. Read labels and do not eat or drink the following: Foods or drinks with added sugars. These include foods that contain brown sugar, corn sweetener, corn syrup, dextrose, fructose, glucose, high-fructose corn syrup, honey, invert sugar, lactose, malt syrup, maltose, molasses, raw sugar, sucrose, trehalose, or turbinado sugar. Limit your intake of added sugars to less than 10% of your total daily calories. Do not eat more than the following amounts of added sugar per day: 6 teaspoons (25 g) for females. 9 teaspoons (38 g) for males. Foods that contain processed or refined starches and grains. Refined grain products, such as white flour, degermed cornmeal, white bread, and white rice. Shopping Choose nutrient-rich snacks, such as vegetables, whole fruits, and nuts. Avoid high-calorie and high-sugar snacks, such as potato chips, fruit snacks, and candy. Use oil-based dressings and spreads on foods instead of solid fats such as butter, margarine,  sour cream, or cream cheese. Limit pre-made sauces, mixes, and "instant" products such as flavored rice, instant noodles, and ready-made pasta. Try more plant-protein sources, such as tofu, tempeh, black beans, edamame, lentils, nuts, and seeds. Explore eating plans such as the Mediterranean diet or vegetarian diet. Try heart-healthy dips made with beans and healthy fats like hummus and guacamole. Vegetables go great with these. Cooking Use oil to saut or stir-fry foods instead of solid fats such as butter, margarine, or lard. Try baking, boiling, grilling, or broiling instead of frying. Remove the fatty part of meats before cooking. Steam vegetables in water or broth. Meal planning  At meals, imagine dividing your plate into fourths: One-half of your plate is fruits and vegetables. One-fourth of your plate is whole grains. One-fourth of your plate is protein, especially lean meats, poultry, eggs, tofu, beans, or nuts. Include low-fat dairy as part of your daily diet. Lifestyle Choose healthy options in all settings, including home, work, school, restaurants, or stores. Prepare your food safely: Wash your hands after handling raw meats. Where you prepare food, keep surfaces clean by regularly washing with hot, soapy water. Keep raw meats separate from ready-to-eat foods, such as fruits and vegetables. Cook seafood, meat, poultry, and eggs to the recommended temperature. Get a food thermometer. Store foods at safe temperatures. In general: Keep cold foods at 19F (4.4C) or below. Keep hot foods at 119F (60C) or above. Keep your freezer at [facility] (-17.8C) or below. Foods are not safe to eat if they have been between the temperatures of 40-119F (4.4-60C) for more than 2 hours. What foods should I eat? Fruits Aim to eat 1-2 cups of fresh,  canned (in natural juice), or frozen fruits each day. One cup of fruit equals 1 small apple, 1 large banana, 8 large strawberries, 1 cup (237 g)  canned fruit,  cup (82 g) dried fruit, or 1 cup (240 mL) 100% juice. Vegetables Aim to eat 2-4 cups of fresh and frozen vegetables each day, including different varieties and colors. One cup of vegetables equals 1 cup (91 g) broccoli or cauliflower florets, 2 medium carrots, 2 cups (150 g) raw, leafy greens, 1 large tomato, 1 large bell pepper, 1 large sweet potato, or 1 medium white potato. Grains Aim to eat 5-10 ounce-equivalents of whole grains each day. Examples of 1 ounce-equivalent of grains include 1 slice of bread, 1 cup (40 g) ready-to-eat cereal, 3 cups (24 g) popcorn, or  cup (93 g) cooked rice. Meats and other proteins Try to eat 5-7 ounce-equivalents of protein each day. Examples of 1 ounce-equivalent of protein include 1 egg,  oz nuts (12 almonds, 24 pistachios, or 7 walnut halves), 1/4 cup (90 g) cooked beans, 6 tablespoons (90 g) hummus or 1 tablespoon (16 g) peanut butter. A cut of meat or fish that is the size of a deck of cards is about 3-4 ounce-equivalents (85 g). Of the protein you eat each week, try to have at least 8 sounce (227 g) of seafood. This is about 2 servings per week. This includes salmon, trout, herring, sardines, and anchovies. Dairy Aim to eat 3 cup-equivalents of fat-free or low-fat dairy each day. Examples of 1 cup-equivalent of dairy include 1 cup (240 mL) milk, 8 ounces (250 g) yogurt, 1 ounces (44 g) natural cheese, or 1 cup (240 mL) fortified soy milk. Fats and oils Aim for about 5 teaspoons (21 g) of fats and oils per day. Choose monounsaturated fats, such as canola and olive oils, mayonnaise made with olive oil or avocado oil, avocados, peanut butter, and most nuts, or polyunsaturated fats, such as sunflower, corn, and soybean oils, walnuts, pine nuts, sesame seeds, sunflower seeds, and flaxseed. Beverages Aim for 6 eight-ounce glasses of water per day. Limit coffee to 3-5 eight-ounce cups per day. Limit caffeinated beverages that have added calories,  such as soda and energy drinks. If you drink alcohol: Limit how much you have to: 0-1 drink a day if you are male. 0-2 drinks a day if you are male. Know how much alcohol is in your drink. In the [place] one drink is one 12 oz bottle of beer (355 mL), one 5 oz glass of wine (148 mL), or one 1 oz glass of hard liquor (44 mL). Seasoning and other foods Try not to add too much salt to your food. Try using herbs and spices instead of salt. Try not to add sugar to food. This information is based on [place] nutrition guidelines. To learn more, visit DisposableNylon.be. Exact amounts may vary. You may need different amounts. This information is not intended to replace advice given to you by your health care provider. Make sure you discuss any questions you have with your health care provider. Document Revised: 06/29/2022 Document Reviewed: 06/29/2022 Elsevier Patient Education  2024 [employer].

## 2023-03-18 NOTE — Progress Notes (Signed)
Patient ID: Matthew [name], male    DOB: [birth year]  MRN: [id number]  CC: Hypertension (HTN f/u. Med refills/Discuss weight loss. Matthew [name] colonoscopy in 2018 - ins pt to sign ROI)   Subjective: Matthew [name] is a 57 y.o. male who presents for chronic ds management His concerns today include:  Patient with history of CAD with 2 stent (pt has declined statin therapy),  HL, elev blood pressure, former smoker ( quit 11/2019), emphysema on CT with normal PFTs 01/2020, COVID pneumonia with resulting chronic hypoxia req home O2 (09/2020) pulmonary fibrosis, hemoptysis, mood swings on Lexapro   CAD/HL:  saw Matthew [name] yesterday.  Blood pressure was elevated and lipid profile revealed LDL of 115 with goal being less than 55.  He recommended starting atorvastatin 40 mg daily but has not sent the prescription as yet.  Patient tells me Matthew [name] will take care of it.  Pul fibrosis/Emphysema: Followed by Matthew [name] home he saw in February of this year.  He really did not want to discuss his lung issue stating that Matthew [name] takes care of all of that.  He is not having to use oxygen at all.  Compliant with Spiriva inhaler.  CT of the chest had revealed 1 cm LUL nodule.  Looks like there is plan for repeat CT of the chest later this month.  Patient tells me he does not have any spot on his lung/nodule.  RT kidney mass: On CT of the chest that was done in February of this year, it caught part of the right kidney and revealed mass in the right kidney.  CT of the abdomen confirmed cystic lesion in the upper pole measuring 10 cm in greatest diameter.  MRI was suggested.  Patient tells me that this has been there for years and urologist Matthew [name] knows about it and is not worried about it.  BP up today more than yesterday when he saw Matthew [name].  "I don't like doctors and  hospitals and I ain't taking no blood pressure medication."   Does not check blood pressure at home Not checking blood pressure No  HA/dizziness  Obesity:  wanted to try Ozempic "I eat whatever I want but I eat a lot of fruits."  Plans to cut back on fried foods/meat.  Has cut back on sugar intact for past 2.5 mths.  Stopped purchasing soft drinks to keep at home.  Uses less sugar in his sweet tea.  Likes canned biscuits. Likes sweet potatoes and bake potatoes. -only exercise is when he is outside working in his yard.   Mood swings: Doing well on Lexapro and requests refills.  HM: On last visit I requested that he sign a release for [place] to get copy of colonoscopy report from Matthew [name].  Patient states he did.  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   Pulmonary emphysema, unspecified emphysema type (HCC) 10/17/2021   Essential hypertension 10/17/2021   Statin declined 10/17/2021   Mood swings 04/24/2021   Elevated blood pressure reading 04/24/2021   Obesity (BMI 30.0-34.9) 04/24/2021   Acute respiratory failure with hypoxia (HCC) 09/26/2020   CAD (coronary artery disease) 09/26/2020   COVID-19 virus infection 09/26/2020   Tobacco abuse 12/04/2019   Respiratory failure (HCC)    Hemoptysis 11/17/2019     Current Outpatient Medications on File Prior to Visit  Medication Sig Dispense  Refill   Aspirin 81 MG CAPS Aspirin 81 mg     COVID-19 mRNA vaccine 2023-2024 (COMIRNATY) SUSP injection Inject 0.3 mLs into the muscle. 0.3 mL 0   Tiotropium Bromide Monohydrate (SPIRIVA RESPIMAT) 2.5 MCG/ACT AERS Inhale 2 puffs into the lungs daily. 12 g 3   vitamin B-12 (CYANOCOBALAMIN) 1000 MCG tablet Take 5,000 mcg by mouth daily.     Vitamin D, Cholecalciferol, 10 MCG (400 UNIT) CAPS Take 400 Units by mouth daily.     vitamin E 1000 UNIT capsule Take 1,000 Units by mouth daily.     albuterol (VENTOLIN HFA) 108 (90 Base) MCG/ACT inhaler INHALE 2 PUFFS INTO THE LUNGS EVERY SIX HOURS AS NEEDED FOR WHEEZING OR SHORTNESS  OF BREATH. 18 g 2   No current facility-administered medications on file prior to visit.    Allergies  Allergen Reactions   Bee Venom    Codeine Nausea And Vomiting   Onion Swelling    "thoart swells up"    Social History   Socioeconomic History   Marital status: Married    Spouse name: Not on file   Number of children: Not on file   Years of education: Not on file   Highest education level: Not on file  Occupational History   Not on file  Tobacco Use   Smoking status: Former    Packs/day: 2.00    Years: 39.00    Additional pack years: 0.00    Total pack years: 78.00    Types: Cigarettes    Start date: 49    Quit date: 11/17/2019    Years since quitting: 3.3   Smokeless tobacco: Never   Tobacco comments:    since age 67  Vaping Use   Vaping Use: Never used  Substance and Sexual Activity   Alcohol use: Yes    Comment: seldom   Drug use: No   Sexual activity: Yes  Other Topics Concern   Not on file  Social History Narrative   Not on file   Social Determinants of Health   Financial Resource Strain: Not on file  Food Insecurity: Not on file  Transportation Needs: Not on file  Physical Activity: Not on file  Stress: Not on file  Social Connections: Not on file  Intimate Partner Violence: Not on file    Family History  Problem Relation Age of Onset   Cancer Mother    Cancer Father     Past Surgical History:  Procedure Laterality Date   CARDIAC CATHETERIZATION  12-09-2009  dr Ty Hilts   abnormal stress test (12-05-2009) widely patent coronaries present w/ evidence of diffuse atherosclerotic disease/  widely patent stented segments in the proximal and mid segment RCA/  LVEF 65%   CORONARY ANGIOPLASTY WITH STENT PLACEMENT  12-28-2008  dr berry   PCI and stenting of the proxmial and mid RCA using cutting balloon atherectomy (x2 DES, Xience)/  LVEF >60% w/ normal wall motion   ESOPHAGOGASTRODUODENOSCOPY N/A 11/17/2019   Procedure: ESOPHAGOGASTRODUODENOSCOPY  (EGD);  Surgeon: Matthew Hawking, MD;  Location: [facility];  Service: Gastroenterology;  Laterality: N/A;   KNEE ARTHROSCOPY W/ MENISCECTOMY Right 2002   NEUROPLASTY / TRANSPOSITION ULNAR NERVE AT ELBOW Right 10/2014   dr Dorris Fetch BURSECTOMY Left 05/06/2017   Procedure: LEFT ELBOW OLECRANON BURSA EXCISION;  Surgeon: Bradly Bienenstock, MD;  Location: [facility];  Service: Orthopedics;  Laterality: Left;   ORIF RIGHT ANKLE FX  2006   TONSILLECTOMY  age 13  TRANSTHORACIC ECHOCARDIOGRAM  03/30/2016   mild concentric LVH, ef 50-55%/  trivial TR    ROS: Review of Systems Negative except as stated above  PHYSICAL EXAM: BP (!) 143/92 (BP Location: Left Arm, Patient Position: Sitting, Cuff Size: Normal)   Pulse 76   Temp 97.7 F (36.5 C) (Oral)   Ht 5\' 11"  (1.803 m)   Wt 267 lb (121.1 kg)   SpO2 92%   BMI 37.24 kg/m   Wt Readings from Last 3 Encounters:  03/18/23 267 lb (121.1 kg)  03/17/23 264 lb (119.7 kg)  12/03/22 262 lb 12.8 oz (119.2 kg)    Physical Exam  General appearance - alert, well appearing, obese older Caucasian male and in no distress Mental status - normal mood, behavior, speech, dress, motor activity, and thought processes Chest - clear to auscultation, no wheezes, rales or rhonchi, symmetric air entry Heart - normal rate, regular rhythm, normal S1, S2, no murmurs, rubs, clicks or gallops Extremities - peripheral pulses normal, no pedal edema, no clubbing or cyanosis     Latest Ref Rng & Units 03/17/2023    9:44 AM 12/03/2022   11:53 AM 09/15/2022    2:11 PM  CMP  Glucose 70 - 99 mg/dL  82  95   BUN 6 - 23 mg/dL  10  13   Creatinine 1.61 - 1.50 mg/dL  0.96  0.45   Sodium 409 - 145 mEq/L  136  138   Potassium 3.5 - 5.1 mEq/L  3.9  5.0   Chloride 96 - 112 mEq/L  100  98   CO2 19 - 32 mEq/L  29  25   Calcium 8.4 - 10.5 mg/dL  9.5  9.7   Total Protein 6.0 - 8.5 g/dL 7.6   7.5   Total Bilirubin 0.0 - 1.2 mg/dL 0.4   0.3   Alkaline Phos 44 -  121 IU/L 121   127   AST 0 - 40 IU/L 18   20   ALT 0 - 44 IU/L 21   22    Lipid Panel     Component Value Date/Time   CHOL 183 03/17/2023 0944   TRIG 216 (H) 03/17/2023 0944   HDL 30 (L) 03/17/2023 0944   CHOLHDL 6.1 (H) 03/17/2023 0944   LDLCALC 115 (H) 03/17/2023 0944    CBC    Component Value Date/Time   WBC 10.6 09/15/2022 1411   WBC 15.3 (H) 01/01/2021 1048   RBC 5.43 09/15/2022 1411   RBC 4.12 (L) 01/01/2021 1048   HGB 16.4 09/15/2022 1411   HCT 48.1 09/15/2022 1411   PLT 233 09/15/2022 1411   MCV 89 09/15/2022 1411   MCH 30.2 09/15/2022 1411   MCH 30.3 11/09/2020 0452   MCHC 34.1 09/15/2022 1411   MCHC 33.8 01/01/2021 1048   RDW 12.9 09/15/2022 1411   LYMPHSABS 1.8 12/10/2020 1145   MONOABS 0.9 12/10/2020 1145   EOSABS 0.1 12/10/2020 1145   BASOSABS 0.1 12/10/2020 1145    ASSESSMENT AND PLAN: 1. Class 2 severe obesity with serious comorbidity and body mass index (BMI) of 37.0 to 37.9 in adult, unspecified obesity type [facility]) Advised patient that insurances will only pay for Ozempic if there is a diagnosis of diabetes as well.  Even [place], insurances most of the time declines likely due to cost. Patient advised to eliminate sugary drinks from the diet, cut back on portion sizes especially of white carbohydrates, eat more white lean meat like chicken [place] and seafood  instead of beef or pork and incorporate fresh fruits and vegetables into the diet daily. Discuss exercise.  He feels he can walk in his neighborhood at least half a mile going on coming.  Encouraged him to do that several days a week.  Advised to take his albuterol inhaler with him at all times to use if needed. -Recommend referral to medical weight management.  He is agreeable to this if covered by his insurance. - Amb Ref to Medical Weight Management  2. Mood swings - escitalopram (LEXAPRO) 20 MG tablet; Take 1 tablet (20 mg total) by mouth daily.  Dispense: 90 tablet; Refill: 2  3. Essential  hypertension Blood pressure has been intermittently elevated on visits.  He may have a component of whitecoat hypertension on top of essential hypertension.  He is not interested in being on medication.  4. Coronary artery disease involving native coronary artery of native heart without angina pectoris Stable and followed by cardiology.  Continue aspirin.  I told him that I can send a prescription to his pharmacy for the atorvastatin but he does not want me to write the prescription stating that he would wait for Matthew [name] to take care of it  5. Mixed hyperlipidemia See #4 above  6. Pulmonary emphysema, unspecified emphysema type (HCC) Followed by pulmonary.  Stable on current inhaler Spiriva.  7. Chronic respiratory failure with hypoxia, on home oxygen therapy (HCC) No longer having to use his O2 per his report.  8. Lung nodule Followed by pulmonary.  9. Renal mass, right Patient reports that his urologist Matthew [name] is aware of this and is not concerned about it.     Patient was given the opportunity to ask questions.  Patient verbalized understanding of the plan and was able to repeat key elements of the plan.   This documentation was completed using Paediatric nurse.  Any transcriptional errors are unintentional.  Orders Placed This Encounter  Procedures   Amb Ref to Medical Weight Management     Requested Prescriptions   Signed Prescriptions Disp Refills   escitalopram (LEXAPRO) 20 MG tablet 90 tablet 2    Sig: Take 1 tablet (20 mg total) by mouth daily.    Return in about 7 weeks (around 05/06/2023) for Welcome to Medicare Visit.  Jonah Blue, MD, FACP

## 2023-03-19 ENCOUNTER — Other Ambulatory Visit: Payer: Self-pay

## 2023-03-19 DIAGNOSIS — E785 Hyperlipidemia, unspecified: Secondary | ICD-10-CM

## 2023-03-19 DIAGNOSIS — I2510 Atherosclerotic heart disease of native coronary artery without angina pectoris: Secondary | ICD-10-CM

## 2023-03-19 MED ORDER — ATORVASTATIN CALCIUM 40 MG PO TABS: 40.0000 mg | ORAL_TABLET | Freq: Every day | ORAL | 3 refills | Status: DC

## 2023-03-24 ENCOUNTER — Ambulatory Visit
Admission: RE | Admit: 2023-03-24 | Discharge: 2023-03-24 | Disposition: A | Discharge status: Home / Self Care | Payer: PPO | Source: Ambulatory Visit | Attending: Internal Medicine | Admitting: Internal Medicine

## 2023-03-24 DIAGNOSIS — R911 Solitary pulmonary nodule: Secondary | ICD-10-CM | POA: Diagnosis not present

## 2023-03-25 ENCOUNTER — Telehealth: Payer: Self-pay

## 2023-03-25 DIAGNOSIS — G5602 Carpal tunnel syndrome, left upper limb: Secondary | ICD-10-CM | POA: Diagnosis not present

## 2023-03-25 MED ORDER — ATORVASTATIN CALCIUM 40 MG PO TABS: 40.0000 mg | ORAL_TABLET | Freq: Every day | ORAL | 3 refills | Status: DC

## 2023-03-25 NOTE — Telephone Encounter (Signed)
Patient walked in office stating new cholesterol medication was never sent to his pharmacy.He wanted to be sent to [facility].Atorvastatin 40 mg daily sent to [facility].

## 2023-03-29 ENCOUNTER — Other Ambulatory Visit: Payer: 59

## 2023-04-21 DIAGNOSIS — M546 Pain in thoracic spine: Secondary | ICD-10-CM | POA: Diagnosis not present

## 2023-04-21 DIAGNOSIS — M9904 Segmental and somatic dysfunction of sacral region: Secondary | ICD-10-CM | POA: Diagnosis not present

## 2023-04-21 DIAGNOSIS — M5388 Other specified dorsopathies, sacral and sacrococcygeal region: Secondary | ICD-10-CM | POA: Diagnosis not present

## 2023-04-21 DIAGNOSIS — M9902 Segmental and somatic dysfunction of thoracic region: Secondary | ICD-10-CM | POA: Diagnosis not present

## 2023-04-21 DIAGNOSIS — M5417 Radiculopathy, lumbosacral region: Secondary | ICD-10-CM | POA: Diagnosis not present

## 2023-05-12 DIAGNOSIS — L738 Other specified follicular disorders: Secondary | ICD-10-CM | POA: Diagnosis not present

## 2023-05-12 DIAGNOSIS — D2239 Melanocytic nevi of other parts of face: Secondary | ICD-10-CM | POA: Diagnosis not present

## 2023-05-12 DIAGNOSIS — L570 Actinic keratosis: Secondary | ICD-10-CM | POA: Diagnosis not present

## 2023-05-13 ENCOUNTER — Ambulatory Visit (INDEPENDENT_AMBULATORY_CARE_PROVIDER_SITE_OTHER): Payer: PPO | Admitting: Plastic Surgery

## 2023-05-13 ENCOUNTER — Encounter: Payer: Self-pay | Admitting: Plastic Surgery

## 2023-05-13 VITALS — BP 132/84 | HR 75 | Resp 20

## 2023-05-13 DIAGNOSIS — L989 Disorder of the skin and subcutaneous tissue, unspecified: Secondary | ICD-10-CM

## 2023-05-13 NOTE — Progress Notes (Signed)
Referring Provider Marcine Matar, MD [address] [place],  [place] [address]   CC:  Chief Complaint  Patient presents with   Consult      Matthew [name] is an 57 y.o. male.  HPI: Matthew [name] is a 57 year old male who presents today for evaluation and treatment of a skin lesion at the right nasolabial crease just lateral to the right commissure.  Patient states that the lesion has been there for approximately 58 years and that he has thought many times about having it removed.  It interferes with his ability to shave and trim his mustache.  Additionally he has a very large pore just below the right nare which she feels is due to an ingrown hair he would also like this addressed at the same time.  Allergies  Allergen Reactions   Bee Venom    Codeine Nausea And Vomiting   Onion Swelling    "thoart swells up"    Outpatient Encounter Medications as of 05/13/2023  Medication Sig   albuterol (VENTOLIN HFA) 108 (90 Base) MCG/ACT inhaler INHALE 2 PUFFS INTO THE LUNGS EVERY SIX HOURS AS NEEDED FOR WHEEZING OR SHORTNESS OF BREATH.   Aspirin 81 MG CAPS Aspirin 81 mg   atorvastatin (LIPITOR) 40 MG tablet Take 1 tablet (40 mg total) by mouth daily.   atorvastatin (LIPITOR) 40 MG tablet Take 1 tablet (40 mg total) by mouth daily.   COVID-19 mRNA vaccine 2023-2024 (COMIRNATY) SUSP injection Inject 0.3 mLs into the muscle.   escitalopram (LEXAPRO) 20 MG tablet Take 1 tablet (20 mg total) by mouth daily.   Tiotropium Bromide Monohydrate (SPIRIVA RESPIMAT) 2.5 MCG/ACT AERS Inhale 2 puffs into the lungs daily.   vitamin B-12 (CYANOCOBALAMIN) 1000 MCG tablet Take 5,000 mcg by mouth daily.   Vitamin D, Cholecalciferol, 10 MCG (400 UNIT) CAPS Take 400 Units by mouth daily.   vitamin E 1000 UNIT capsule Take 1,000 Units by mouth daily.   No facility-administered encounter medications on file as of 05/13/2023.     Past Medical History:  Diagnosis Date   Coronary artery disease  per pt last cardiology visit 2011 (found documentation 12-05-2009 w/ dr Amil Amen with [facility] in epic)/  currently pt is followed by pcp   12-28-2008  PCI and DES x2 to proximal and mid RCA/ last cardiac cath 12-09-2009  widely patent stents   GERD (gastroesophageal reflux disease)    Hyperlipidemia    Olecranon bursitis of left elbow    Respiratory failure (HCC)    S/P right coronary artery (RCA) stent placement 12/28/2008   PCI and DES x2-- proximal and mid RCA   Wears dentures    UPPER    Past Surgical History:  Procedure Laterality Date   CARDIAC CATHETERIZATION  12-09-2009  dr Ty Hilts   abnormal stress test (12-05-2009) widely patent coronaries present w/ evidence of diffuse atherosclerotic disease/  widely patent stented segments in the proximal and mid segment RCA/  LVEF 65%   CORONARY ANGIOPLASTY WITH STENT PLACEMENT  12-28-2008  dr berry   PCI and stenting of the proxmial and mid RCA using cutting balloon atherectomy (x2 DES, Xience)/  LVEF >60% w/ normal wall motion   ESOPHAGOGASTRODUODENOSCOPY N/A 11/17/2019   Procedure: ESOPHAGOGASTRODUODENOSCOPY (EGD);  Surgeon: Jeani Hawking, MD;  Location: [facility];  Service: Gastroenterology;  Laterality: N/A;   KNEE ARTHROSCOPY W/ MENISCECTOMY Right 2002   NEUROPLASTY / TRANSPOSITION ULNAR NERVE AT ELBOW Right 10/2014   dr Dorris Fetch  BURSECTOMY Left 05/06/2017   Procedure: LEFT ELBOW OLECRANON BURSA EXCISION;  Surgeon: Bradly Bienenstock, MD;  Location: [facility];  Service: Orthopedics;  Laterality: Left;   ORIF RIGHT ANKLE FX  2006   TONSILLECTOMY  age 28   TRANSTHORACIC ECHOCARDIOGRAM  03/30/2016   mild concentric LVH, ef 50-55%/  trivial TR    Family History  Problem Relation Age of Onset   Cancer Mother    Cancer Father     Social History   Social History Narrative   Not on file     Review of Systems General: Denies fevers, chills, weight loss CV: Denies chest pain, shortness of breath,  palpitations Skin: A 5 mm pedunculated skin lesion at the right nasolabial fold.  No history of pain or drainage or past infection.  1 mm opening above the lip.  Again no history of infection.  Physical Exam    05/13/2023   10:25 AM 03/18/2023    8:41 AM 03/17/2023    9:16 AM  Vitals with BMI  Height  5\' 11"  5\' 11"   Weight  267 lbs 264 lbs  BMI  37.26 36.84  Systolic 132 143 578  Diastolic 84 92 95  Pulse 75 76 76    General:  No acute distress,  Alert and oriented, Non-Toxic, Normal speech and affect Integument: 5 mm pedunculated skin lesion as noted above he denies any recent change in the lesion.  The lesion does not have any concerning characteristics.  The pore that he is concerned about could likely be an ingrown hair but it is unclear   Assessment/Plan Skin lesion: Skin lesion present for many years.  This would be readily amenable to excision under local.  He understands that we will have several sutures that will need to be left in place for 5 to 7 days before they are removed.  I am not sure what the port is.  It may indeed be an ingrown hair which I will check at the time of the excision of the skin lesion.  Will plan on excising the edges and closing that if possible all questions answered to his satisfaction.  Photographs obtained with his consent.  Will schedule for operative removal of the skin lesion at his request.  Santiago Glad 05/13/2023, 10:26 AM

## 2023-05-27 ENCOUNTER — Telehealth: Payer: Self-pay | Admitting: Internal Medicine

## 2023-05-27 NOTE — Telephone Encounter (Signed)
Patient called answering service again. States nurse needs to call in a strong antibiotic, he is not going to the doctors office or ER, has head and chest congestions. Pharmacy:[facility]

## 2023-05-27 NOTE — Telephone Encounter (Signed)
PT ret Leslie's call. Covid Test was neg, he said.

## 2023-05-27 NOTE — Telephone Encounter (Signed)
Spoke with the pt  He is c/o increased cough- white sputum x 2 days  He is having head and chest congestion  Denies any fevers, aches No increased SOB, wheezing  Pt states he never f/u bc he was not called for appt- recall was placed but never done  He is requesting something be called in  He refuses UC or ED  He has not taken covid test and I urged him to do so   MR- please advise, thanks!  Allergies  Allergen Reactions   Bee Venom    Codeine Nausea And Vomiting   Onion Swelling    "thoart swells up"

## 2023-05-27 NOTE — Telephone Encounter (Signed)
PT would like Dr. Jane Canary nurse to call him back. He would not say what the issue was. States he did not want to violate any HIPAA laws.   His # is [phone]

## 2023-05-28 MED ORDER — DOXYCYCLINE HYCLATE 100 MG PO TABS: 100.0000 mg | ORAL_TABLET | Freq: Two times a day (BID) | ORAL | 0 refills | Status: DC

## 2023-05-28 MED ORDER — PREDNISONE 10 MG PO TABS: ORAL_TABLET | ORAL | 0 refills | Status: DC

## 2023-05-28 NOTE — Telephone Encounter (Signed)
I called and spoke with the pt and notified of response per MR He verbalized understanding Rxs were sent to pharm  Appt for fu scheduled  Nothing further needed

## 2023-05-28 NOTE — Telephone Encounter (Signed)
He has post covid fibrosis  Plan  - Take doxycycline 100mg  po twice daily x 5 days; take after meals and avoid sunlight  - Please take prednisone 40 mg x1 day, then 30 mg x1 day, then 20 mg x1 day, then 10 mg x1 day, and then 5 mg x1 day and stop

## 2023-06-02 ENCOUNTER — Telehealth: Payer: Self-pay | Admitting: Internal Medicine

## 2023-06-02 NOTE — Telephone Encounter (Signed)
Patient is calling stating that he still has fluid build up in his lungs. He has finished taking both medications that he was given. He would like a call from a nurse. Call back number [phone]

## 2023-06-03 NOTE — Telephone Encounter (Signed)
He wanted a call back from the nurse.  Please call and find out what exactly he wants.  If he is sick it sounds like he needs to go to urgent care or the ER or be given a visit in the next 1 week or so with the nurse practitioner.

## 2023-06-04 NOTE — Telephone Encounter (Signed)
Pt called in being very rude and nasty I tried to explain to him what is going on in the office as far as call backs go. Read pt dr. Marchelle Gearing messages and would not let me explain what it is we can do he just continued to express his frustration

## 2023-06-09 ENCOUNTER — Encounter: Payer: Self-pay | Admitting: Plastic Surgery

## 2023-06-09 ENCOUNTER — Ambulatory Visit: Payer: PPO | Admitting: Plastic Surgery

## 2023-06-09 VITALS — BP 148/97 | HR 88

## 2023-06-09 DIAGNOSIS — L989 Disorder of the skin and subcutaneous tissue, unspecified: Secondary | ICD-10-CM

## 2023-06-09 DIAGNOSIS — D2239 Melanocytic nevi of other parts of face: Secondary | ICD-10-CM | POA: Diagnosis not present

## 2023-06-09 DIAGNOSIS — L728 Other follicular cysts of the skin and subcutaneous tissue: Secondary | ICD-10-CM

## 2023-06-09 NOTE — Progress Notes (Signed)
Procedure Note  Preoperative Dx: Skin lesion right nasolabial fold, enlarged hair follicle right upper lip  Postoperative Dx: Same  Procedure: Vision of skin lesion and excision of hair follicle  Anesthesia: Lidocaine 1% with 1:100,000 epinephrine and 0.25% Sensorcaine   Indication for Procedure: Pathologic diagnosis  Description of Procedure: Risks and complications were explained to the patient including the need for additional surgery based on pathology.  Consent was confirmed and the patient understands the risks and benefits.  The potential complications and alternatives were explained and the patient consents.  The patient expressed understanding the option of not having the procedure and the risks of a scar.  Time out was called and all information was confirmed to be correct.    The area was prepped and drapped.  Local anesthetic was injected in the subcutaneous tissues.  After waiting for the local to take affect an elliptical incision was made around the skin lesion in the nasolabial fold.  After obtaining hemostasis, the surgical wound was closed with interrupted 5-0 Prolene sutures.  The surgical wound measured 1 cm.  The enlarged hair follicle on the right upper lip was excised sharply.  The wound was closed with interrupted 5-0 Prolene sutures.  The total length was approximately 2 mm.  A dressing was applied.  The patient was given instructions on how to care for the area and a follow up appointment.  Atthew tolerated the procedure well and there were no complications. The specimen was sent to pathology.   As I was concluding the excision the patient noted that he had 2 more moles that he would like to have removed.  We will schedule removal of those moles for later time.

## 2023-06-16 ENCOUNTER — Ambulatory Visit: Payer: PPO | Admitting: Surgical

## 2023-06-16 VITALS — BP 132/89 | HR 83 | Temp 97.8°F

## 2023-06-16 DIAGNOSIS — L989 Disorder of the skin and subcutaneous tissue, unspecified: Secondary | ICD-10-CM

## 2023-06-16 NOTE — Progress Notes (Signed)
Patient is a 57 year old male here for follow-up after excision of skin lesions of his right upper lip and right cheek with Dr. Ladona Ridgel on 06/09/2023.  He is 1 week postop.  Pathology: Skin , right nasolabial fold EXCISION, MELANOCYTIC NEVUS, INTRADERMAL TYPE, IRRITATED, PERIPHERAL MARGIN INVOLVED  On exam right cheek and right upper lip incisions are intact and healing well, some slight peri-incisional irritation from the sutures noted.  Prolene sutures noted.  There is no cellulitic changes or tenderness noted palpation.  A/P:  Prolene sutures were removed, patient tolerated this well.  No signs infection or concern on exam. Discussed pathology results. Recommend Vaseline for 2 to 3 days on the right cheek incision, recommend avoiding shaving in this area for 1 more week.  Recommend following up as needed.

## 2023-06-23 DIAGNOSIS — L814 Other melanin hyperpigmentation: Secondary | ICD-10-CM | POA: Diagnosis not present

## 2023-06-23 DIAGNOSIS — L821 Other seborrheic keratosis: Secondary | ICD-10-CM | POA: Diagnosis not present

## 2023-06-23 DIAGNOSIS — Z7189 Other specified counseling: Secondary | ICD-10-CM | POA: Diagnosis not present

## 2023-06-23 DIAGNOSIS — D225 Melanocytic nevi of trunk: Secondary | ICD-10-CM | POA: Diagnosis not present

## 2023-07-16 ENCOUNTER — Ambulatory Visit (INDEPENDENT_AMBULATORY_CARE_PROVIDER_SITE_OTHER): Payer: PPO | Admitting: Internal Medicine

## 2023-07-16 VITALS — BP 120/68 | HR 71 | Ht 71.0 in | Wt 260.0 lb

## 2023-07-16 DIAGNOSIS — J8410 Pulmonary fibrosis, unspecified: Secondary | ICD-10-CM | POA: Diagnosis not present

## 2023-07-16 DIAGNOSIS — R911 Solitary pulmonary nodule: Secondary | ICD-10-CM

## 2023-07-16 DIAGNOSIS — R0609 Other forms of dyspnea: Secondary | ICD-10-CM

## 2023-07-16 DIAGNOSIS — J439 Emphysema, unspecified: Secondary | ICD-10-CM | POA: Diagnosis not present

## 2023-07-16 DIAGNOSIS — U099 Post covid-19 condition, unspecified: Secondary | ICD-10-CM

## 2023-07-16 NOTE — Patient Instructions (Addendum)
ICD-10-CM   1. COVID-19 long hauler manifesting chronic dyspnea  R06.09    U09.9     2. 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 Pulmonary emphysema, unspecified emphysema type (HCC)  J43.9     4. Right kidney mass  N28.89     5. Coronary artery disease involving native coronary artery of native heart without angina pectoris  I25.10     6. 

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
## 2023-07-21 ENCOUNTER — Ambulatory Visit: Payer: PPO | Admitting: Plastic Surgery

## 2023-07-21 VITALS — BP 110/72 | HR 76

## 2023-07-21 DIAGNOSIS — D22112 Melanocytic nevi of right lower eyelid, including canthus: Secondary | ICD-10-CM | POA: Diagnosis not present

## 2023-07-21 DIAGNOSIS — D2239 Melanocytic nevi of other parts of face: Secondary | ICD-10-CM | POA: Diagnosis not present

## 2023-07-21 DIAGNOSIS — L989 Disorder of the skin and subcutaneous tissue, unspecified: Secondary | ICD-10-CM

## 2023-07-21 NOTE — Progress Notes (Signed)
Procedure Note  Preoperative Dx: Two nevi; right lateral canthus, 3 mm, left infraorbital, 2 mm  Postoperative Dx: Same  Procedure: Excision of nevi  Anesthesia: Lidocaine 1% with 1:100,000 epinephrine and 0.25% Sensorcaine   Indication for Procedure: Removal for pathologic diagnosis  Description of Procedure: Risks and complications were explained to the patient scarring and need for additional procedures based on pathology.  Consent was confirmed and the patient understands the risks and benefits.  The potential complications and alternatives were explained and the patient consents.  The patient expressed understanding the option of not having the procedure and the risks of a scar.  Time out was called and all information was confirmed to be correct.    The area was prepped and drapped.  Local anesthetic was injected in the subcutaneous tissues.  After waiting for the local to take affect the right lateral canthal lesion was addressed first.  An elliptical incision measuring 5 mm was made around the lesion excising it totally.  After obtaining hemostasis, the surgical wound was closed with two 5-0 Prolene sutures.  The left infraorbital skin lesion was addressed next.  The lesion was excised with the scissors through a 2 mm incision.  The incision was not closed and a bandage was placed the surgical wound measured 5 and 2 mm.  A dressing was applied.  The patient was given instructions on how to care for the area and a follow up appointment.  Matthew [name] tolerated the procedure well and there were no complications. The specimen was sent to pathology.

## 2023-07-26 ENCOUNTER — Other Ambulatory Visit: Payer: Self-pay | Admitting: Pharmacist

## 2023-07-26 LAB — DERMATOLOGY PATHOLOGY

## 2023-07-26 NOTE — Progress Notes (Signed)
Pharmacy Quality Measure Review  This patient is appearing on a report for being at risk of failing the adherence measure for cholesterol (statin) medications this calendar year.   Medication: atorvastatin Last fill date: 07/22/2023 for 90 day supply  Insurance report was not up to date. No action needed at this time.   Clinical Pharmacist [facility] [phone]

## 2023-07-28 ENCOUNTER — Encounter: Payer: Self-pay | Admitting: Surgical

## 2023-07-28 ENCOUNTER — Ambulatory Visit: Payer: Self-pay | Admitting: *Deleted

## 2023-07-28 ENCOUNTER — Ambulatory Visit: Payer: PPO | Admitting: Surgical

## 2023-07-28 DIAGNOSIS — L989 Disorder of the skin and subcutaneous tissue, unspecified: Secondary | ICD-10-CM | POA: Diagnosis not present

## 2023-07-28 NOTE — Telephone Encounter (Signed)
//   Summary: Sinus   Pt is calling in because he believes he is having sinus issues. Pt says he is coughing up phlegm, congestion, and blowing his nose frequently. Pt is requesting an antibiotic or some medication to help with the symptoms. Pt says he does not want to go to the doctor's office. He says if a prescription is able to be sent in to send it to [facility] - [place], [place] - [address]     Reason for Disposition  [1] Sinus pain (not just congestion) AND [2] fever  Answer Assessment - Initial Assessment Questions 1. LOCATION: "Where does it hurt?"      Sinus pressure behind eyes 2. ONSET: "When did the sinus pain start?"  (e.g., hours, days)      Monday night 3. SEVERITY: "How bad is the pain?"   (Scale 1-10; mild, moderate or severe)   - MILD (1-3): doesn't interfere with normal activities    - MODERATE (4-7): interferes with normal activities (e.g., work or school) or awakens from sleep   - SEVERE (8-10): excruciating pain and patient unable to do any normal activities        mild 4. RECURRENT SYMPTOM: "Have you ever had sinus problems before?" If Yes, ask: "When was the last time?" and "What happened that time?"      yes 5. NASAL CONGESTION: "Is the nose blocked?" If Yes, ask: "Can you open it or must you breathe through your mouth?"     Yes- constant blowing 6. NASAL DISCHARGE: "Do you have discharge from your nose?" If so ask, "What color?"     clear 7. FEVER: "Do you have a fever?" If Yes, ask: "What is it, how was it measured, and when did it start?"      Yesterday- ear popping 8. OTHER SYMPTOMS: "Do you have any other symptoms?" (e.g., sore throat, cough, earache, difficulty breathing)     Cough, sore throat  Protocols used: Sinus Pain or Congestion-A-AH

## 2023-07-28 NOTE — Telephone Encounter (Signed)
Spoke with patient. Offered patient OV for sinus s/s . Patient stated" I am not coming up there" . Advised patient that provider would want a visit with him due to new issues. Offered VV patient was not happy about this and voiced I know it all about the money. Patient did agree to VV on 07/29/2023.

## 2023-07-28 NOTE — Progress Notes (Signed)
57 year old male here for follow-up after skin lesion removal.  He is doing well, he reports his Prolene sutures fell out this morning after taking a shower and drying off.  He is not having any issues.  He is aware of pathology result, we discussed they were melanocytic nevus, intradermal.  He does have an additional lesion on his right posterior ear that irritates him.  He reports it got causes him discomfort when wearing his reading glasses.  He would like to know if this could be removed with Dr. Ladona Ridgel in the future.  On exam so facial lesion incisions are intact and well-healed.  There is no erythema or cellulitic change. He does have a lesion of his right posterior ear.  It does sit right behind the concha.   A/P:  Patient is healing well from skin lesion removed with Dr. Ladona Ridgel.  He does have an additional lesion behind the right posterior ear that irritates him when wearing his reading glasses, he would like this removed if possible.  Discussed with Dr. Ladona Ridgel, he is comfortable moving in the clinic, will notify staff to schedule for 30-minute procedure.

## 2023-07-28 NOTE — Telephone Encounter (Signed)
  Chief Complaint: sinus symptoms Symptoms: sinus congestion, pain behind eyes and nasal passage, fever and  ear pressure- 2 days ago- not today, congestion- constant running nose-clear, throat sore Frequency: started Monday after mowing lawn- very dusty Pertinent Negatives: Patient denies fever today Disposition: [] ED /[] Urgent Care (no appt availability in office) / [] Appointment(In office/virtual)/ []  [facility]/ [] Home Care/ [x] Refused Recommended Disposition /[] [facility]/ []  Follow-up with PCP Additional Notes: Patient is requesting antibiotic for sinuses- offered UC/VV- patient states he is not equipped, patient states he wants to avoid office and declines appointment- just wants treatment for sinus symptoms. Explained policy- but still declines appointment with anyone.

## 2023-07-29 ENCOUNTER — Telehealth: Payer: Self-pay

## 2023-07-29 ENCOUNTER — Telehealth (HOSPITAL_BASED_OUTPATIENT_CLINIC_OR_DEPARTMENT_OTHER): Payer: PPO | Admitting: Internal Medicine

## 2023-07-29 ENCOUNTER — Encounter: Payer: Self-pay | Admitting: Internal Medicine

## 2023-07-29 DIAGNOSIS — J988 Other specified respiratory disorders: Secondary | ICD-10-CM | POA: Diagnosis not present

## 2023-07-29 DIAGNOSIS — J018 Other acute sinusitis: Secondary | ICD-10-CM

## 2023-07-29 DIAGNOSIS — B9789 Other viral agents as the cause of diseases classified elsewhere: Secondary | ICD-10-CM | POA: Diagnosis not present

## 2023-07-29 MED ORDER — AMOXICILLIN-POT CLAVULANATE 500-125 MG PO TABS: 1.0000 | ORAL_TABLET | Freq: Two times a day (BID) | ORAL | 0 refills | Status: DC

## 2023-07-29 NOTE — Progress Notes (Signed)
Virtual Visit via Video Note  I connected with Matthew [name] on 07/29/2023 at 8:11 a.m by a video enabled telemedicine application and verified that I am speaking with the correct person using two identifiers.  Location: Patient: home Provider: Office   I discussed the limitations of evaluation and management by telemedicine and the availability of in person appointments. The patient expressed understanding and agreed to proceed.  History of Present Illness: Patient with history of CAD with 2 stent (pt has declined statin therapy),  HL, elev blood pressure, former smoker ( quit 11/2019), emphysema on CT with normal PFTs 01/2020, COVID pneumonia with resulting chronic hypoxia req home O2 (09/2020) pulmonary fibrosis, hemoptysis, mood swings on Lexapro  This is an urgent care visit for acute respiratory illness.  Patient complains of head and chest congestion, tightness/pressure behind the frontal and maxillary sinuses, cough sometimes productive of clear mucus and blowing the nose a lot.  Denies any increased shortness of breath or fever.  He has not had to use his albuterol inhaler.  He has been using DayQuil, NyQuil and generic Claritin.  States that he cannot use nasal sprays because they cause nosebleeds for him.  He has not done a home COVID test.  States he is skeptical about doing COVID test every time he gets the sniffles because he does not want to live in fear of that.  Had COVID [employer] booster on 07/12/2023 at [employer]. Symptoms have been present since Monday of this week.  States that he feels a little better today compared to 3 days ago.  Outpatient Encounter Medications as of 07/29/2023  Medication Sig   albuterol (VENTOLIN HFA) 108 (90 Base) MCG/ACT inhaler INHALE 2 PUFFS INTO THE LUNGS EVERY SIX HOURS AS NEEDED FOR WHEEZING OR SHORTNESS OF BREATH.   Aspirin 81 MG CAPS Aspirin 81 mg   atorvastatin (LIPITOR) 40 MG tablet Take 1 tablet (40 mg total) by mouth daily.    escitalopram (LEXAPRO) 20 MG tablet Take 1 tablet (20 mg total) by mouth daily.   Tiotropium Bromide Monohydrate (SPIRIVA RESPIMAT) 2.5 MCG/ACT AERS Inhale 2 puffs into the lungs daily.   vitamin B-12 (CYANOCOBALAMIN) 1000 MCG tablet Take 5,000 mcg by mouth daily.   Vitamin D, Cholecalciferol, 10 MCG (400 UNIT) CAPS Take 400 Units by mouth daily.   vitamin E 1000 UNIT capsule Take 1,000 Units by mouth daily.   No facility-administered encounter medications on file as of 07/29/2023.      Observations/Objective: Patient sitting in chair in NAD.  He has mild to moderate audible congestion.  He does not appear acutely short of breath.  He is talking in full sentences.  Assessment and Plan: 1. Viral respiratory illness Advised patient that his symptoms are consistent with acute viral respiratory illness.  Encouraged him to do a home COVID test.  He can get a kit from [facility], [facility] or [facility].  Let me know if he test positive so that we can consider putting him on Paxlovid.  Otherwise, treat symptomatically.  Recommended using Vicks vapor rub under the nose and on the chest to help decrease the congestion.  He can continue the DayQuil for cough or purchase Robitussin DM over-the-counter.  2. Acute non-recurrent sinusitis of other sinus - amoxicillin-clavulanate (AUGMENTIN) 500-125 MG tablet; Take 1 tablet by mouth in the morning and at bedtime.  Dispense: 14 tablet; Refill: 0   Follow Up Instructions: PRN   I discussed the assessment and treatment plan with the patient. The patient  was provided an opportunity to ask questions and all were answered. The patient agreed with the plan and demonstrated an understanding of the instructions.   The patient was advised to call back or seek an in-person evaluation if the symptoms worsen or if the condition fails to improve as anticipated.  I spent  14 minutes dedicated to the care of this patient on the date of this encounter to include previsit review  of chart and nurse message from yesterday, face-to-face time with patient discussing diagnosis and management and postvisit entering of orders.  This note has been created with Education officer, environmental. Any transcriptional errors are unintentional.  Jonah Blue, MD

## 2023-07-29 NOTE — Telephone Encounter (Signed)
Copied from CRM [id number]. Topic: General - Other >> Jul 29, 2023 11:09 AM Franchot Heidelberg wrote: Reason for CRM: Pt called reporting that he has tested negative for Covid 19. Update for provider

## 2023-08-03 ENCOUNTER — Telehealth: Payer: Self-pay | Admitting: Internal Medicine

## 2023-08-03 NOTE — Telephone Encounter (Signed)
Patient went to PCP and was prescribed antibiotics but is still experiencing chest congestion. He's also been taking Robitussin. Patient would like prednisone to be called in.   Pharmacy [facility] [address] [place]

## 2023-08-03 NOTE — Telephone Encounter (Signed)
Called and spoke to patient.  He stated that he seen PCP last Friday and was prescribed Augmentin. He reports of some relief with abx but feels that he may need prednisone as well.  C/o chest heaviness at night, prod cough with green sputum, wheezing and nasal drainage.  SOB is baseline.  He used albuterol once last week. He is not using spiriva daily. He has not been using oxygen, as he does not feel that it is needed. Spo2 maintaining around 93% on roomair. He is taking Tussin DM Q4H.   MR, please advise. Thanks

## 2023-08-03 NOTE — Telephone Encounter (Signed)
Take prednisone 40 mg daily x 2 days, then 20mg  daily x 2 days, then 10mg  daily x 2 days, then 5mg  daily x 2 days and stop

## 2023-08-04 MED ORDER — PREDNISONE 10 MG PO TABS: ORAL_TABLET | ORAL | 0 refills | Status: DC

## 2023-08-04 NOTE — Telephone Encounter (Signed)
Patient is aware of below message/recommendations and voiced his understanding.  Prednisone has been sent to preferred pharmacy.  Nothing further needed.

## 2023-08-30 ENCOUNTER — Ambulatory Visit: Payer: PPO | Admitting: Plastic Surgery

## 2023-09-01 ENCOUNTER — Ambulatory Visit
Admission: RE | Admit: 2023-09-01 | Discharge: 2023-09-01 | Disposition: A | Discharge status: Home / Self Care | Payer: PPO | Source: Ambulatory Visit | Attending: Internal Medicine | Admitting: Internal Medicine

## 2023-09-01 DIAGNOSIS — I2510 Atherosclerotic heart disease of native coronary artery without angina pectoris: Secondary | ICD-10-CM | POA: Diagnosis not present

## 2023-09-01 DIAGNOSIS — I700 Atherosclerosis of aorta: Secondary | ICD-10-CM | POA: Diagnosis not present

## 2023-09-01 DIAGNOSIS — J439 Emphysema, unspecified: Secondary | ICD-10-CM | POA: Diagnosis not present

## 2023-09-01 DIAGNOSIS — R911 Solitary pulmonary nodule: Secondary | ICD-10-CM | POA: Diagnosis not present

## 2023-09-06 ENCOUNTER — Ambulatory Visit: Payer: PPO | Admitting: Plastic Surgery

## 2023-09-07 ENCOUNTER — Other Ambulatory Visit: Payer: Self-pay | Admitting: Internal Medicine

## 2023-09-07 DIAGNOSIS — J018 Other acute sinusitis: Secondary | ICD-10-CM

## 2023-09-07 NOTE — Telephone Encounter (Signed)
Requested medication (s) are due for refill today - no  Requested medication (s) are on the active medication list -yes  Future visit scheduled -no  Last refill: 07/29/23 #14  Notes to clinic: off protocol- provider review   Requested Prescriptions  Pending Prescriptions Disp Refills   amoxicillin-clavulanate (AUGMENTIN) 500-125 MG tablet [Pharmacy Med Name: AMOX-POT/ CLAV 500MG /125MG  500-125 Tablet] 14 tablet 0    Sig: Take 1 tablet by mouth in the morning and at bedtime.     Off-Protocol Failed - 09/07/2023  9:20 AM      Failed - Medication not assigned to a protocol, review manually.      Passed - Valid encounter within last 12 months    Recent Outpatient Visits           1 month ago Viral respiratory illness   [facility] [place]. [facility] Jonah Blue B, MD   5 months ago Class 2 severe obesity with serious comorbidity and body mass index (BMI) of 37.0 to 37.9 in adult, unspecified obesity type [facility])   [facility] Of [place]. [facility] Jonah Blue B, MD   11 months ago Class 2 severe obesity due to excess calories with serious comorbidity and body mass index (BMI) of 36.0 to 36.9 in adult [facility])   [facility] - [facility] Dept Of [place]. [facility] Jonah Blue B, MD   1 year ago Class 2 severe obesity due to excess calories with serious comorbidity and body mass index (BMI) of 36.0 to 36.9 in adult [facility])   [facility] - A Dept Of [place]. [facility] Marcine Matar, MD   1 year ago Essential hypertension   [facility] - [facility] Dept Of [place]. [facility] Marcine Matar, MD       Future Appointments             In 3 weeks Kalman Shan, MD [facility] Care at [facility] Prescriptions  Pending Prescriptions Disp Refills    amoxicillin-clavulanate (AUGMENTIN) 500-125 MG tablet [Pharmacy Med Name: AMOX-POT/ CLAV 500MG /125MG  500-125 Tablet] 14 tablet 0    Sig: Take 1 tablet by mouth in the morning and at bedtime.     Off-Protocol Failed - 09/07/2023  9:20 AM      Failed - Medication not assigned to a protocol, review manually.      Passed - Valid encounter within last 12 months    Recent Outpatient Visits           1 month ago Viral respiratory illness   [facility] Dept Of [place]. [facility] Jonah Blue B, MD   5 months ago Class 2 severe obesity with serious comorbidity and body mass index (BMI) of 37.0 to 37.9 in adult, unspecified obesity type [facility])   [facility] A Dept Of [place]. [facility] Jonah Blue B, MD   11 months ago Class 2 severe obesity due to excess calories with serious comorbidity and body mass index (BMI) of 36.0 to 36.9 in adult [facility])   [facility] - A Dept Of . [facility] Marcine Matar, MD   1 year  ago Class 2 severe obesity due to excess calories with serious comorbidity and body mass index (BMI) of 36.0 to 36.9 in adult [facility])   [facility] - A Dept Of [place]. [facility] Marcine Matar, MD   1 year ago Essential hypertension   [facility] - A Dept Of [place]. [facility] Marcine Matar, MD       Future Appointments             In 3 weeks Kalman Shan, MD [facility] Pulmonary Care at [facility]

## 2023-09-08 ENCOUNTER — Ambulatory Visit
Admission: EM | Admit: 2023-09-08 | Discharge: 2023-09-08 | Disposition: A | Discharge status: Home / Self Care | Payer: PPO

## 2023-09-08 ENCOUNTER — Ambulatory Visit: Payer: Self-pay | Admitting: *Deleted

## 2023-09-08 DIAGNOSIS — J019 Acute sinusitis, unspecified: Secondary | ICD-10-CM | POA: Diagnosis not present

## 2023-09-08 MED ORDER — AMOXICILLIN-POT CLAVULANATE 875-125 MG PO TABS: 1.0000 | ORAL_TABLET | Freq: Two times a day (BID) | ORAL | 0 refills | Status: DC

## 2023-09-08 MED ORDER — BENZONATATE 100 MG PO CAPS: 100.0000 mg | ORAL_CAPSULE | Freq: Three times a day (TID) | ORAL | 0 refills | Status: DC

## 2023-09-08 NOTE — Telephone Encounter (Signed)
Reason for Disposition  [1] Fever returns after gone for over 24 hours AND [2] symptoms worse or not improved  Answer Assessment - Initial Assessment Questions 1. LOCATION: "Where does it hurt?"      I'm having sinus issues.   It's the same thing I had last month.   I'm wheezing again.   Coughing up clear mucus.   Started Sunday.   I felt bad Sat. Night.   I took double shot of Nyquiel.    Yesterday morning it got worse.   I had a virtual visit with Dr. Laural Benes.    2. ONSET: "When did the sinus pain start?"  (e.g., hours, days)      Sunday 3. SEVERITY: "How bad is the pain?"   (Scale 1-10; mild, moderate or severe)   - MILD (1-3): doesn't interfere with normal activities    - MODERATE (4-7): interferes with normal activities (e.g., work or school) or awakens from sleep   - SEVERE (8-10): excruciating pain and patient unable to do any normal activities        Moderate 4. RECURRENT SYMPTOM: "Have you ever had sinus problems before?" If Yes, ask: "When was the last time?" and "What happened that time?"      Yes 5. NASAL CONGESTION: "Is the nose blocked?" If Yes, ask: "Can you open it or must you breathe through your mouth?"     Yes 6. NASAL DISCHARGE: "Do you have discharge from your nose?" If so ask, "What color?"     Yes 7. FEVER: "Do you have a fever?" If Yes, ask: "What is it, how was it measured, and when did it start?"      Maybe low grade fever.    8. OTHER SYMPTOMS: "Do you have any other symptoms?" (e.g., sore throat, cough, earache, difficulty breathing)     I'm coughing, blowing my nose.    9. PREGNANCY: "Is there any chance you are pregnant?" "When was your last menstrual period?"     N/A I requested an Amoxicillin via MyChart.  Protocols used: Sinus Pain or Congestion-A-AH

## 2023-09-08 NOTE — Telephone Encounter (Signed)
  Chief Complaint: Sinus infection, requesting an antibiotic be called in.   Symptoms: Sinus congestion, coughing up clear mucus, feel bad Frequency: Since Sunday   Yesterday got to feeling worse. Pertinent Negatives: Patient denies fever that he is aware of. Disposition: [] ED /[] Urgent Care (no appt availability in office) / [] Appointment(In office/virtual)/ []  [place] [facility]/ [] Home Care/ [] Refused Recommended Disposition /[x] [facility]/ []  Follow-up with PCP Additional Notes: No appts at [facility] with any of the providers.   Referred to the [facility].   He was agreeable to this.   Gave him the locations and hours.

## 2023-09-08 NOTE — Telephone Encounter (Signed)
Noted  

## 2023-09-08 NOTE — ED Triage Notes (Signed)
"  I am having Sinus problems again, I took some Nyquil Saturday night but didn't help a lot". Prior to this on Friday "I mulched up leaves and worked outside" that seems to flare this up. No fever. "Just seems to be sinus pressure, headache and post nasal drip". No sob. No wheezing.

## 2023-09-08 NOTE — ED Provider Notes (Signed)
[facility]    CSN: [id number] Arrival date & time: 09/08/23  0911      History   Chief Complaint Chief Complaint  Patient presents with   Sinus Problem    HPI Matthew [name] is a 57 y.o. male.   Patient here today for evaluation of sinus congestion and pressure that started about 5 or 6 days ago.  She reports that this all started after he had mowed subsequently while working outside.  He notes that since having COVID previously he has had more issues with his sinuses.  He has not had any fever.  He does have some cough and requests a cough "pills" as these have worked well in the past.  He denies any shortness of breath or wheezing.  The history is provided by the patient.  Sinus Problem Associated symptoms include headaches. Pertinent negatives include no abdominal pain and no shortness of breath.    Past Medical History:  Diagnosis Date   Coronary artery disease per pt last cardiology visit 2011 (found documentation 12-05-2009 w/ dr Amil Amen [facility])/  currently pt is followed by pcp   12-28-2008  PCI and DES x2 to proximal and mid RCA/ last cardiac cath 12-09-2009  widely patent stents   GERD (gastroesophageal reflux disease)    Hyperlipidemia    Olecranon bursitis of left elbow    Respiratory failure (HCC)    S/P right coronary artery (RCA) stent placement 12/28/2008   PCI and DES x2-- proximal and mid RCA   Wears dentures    UPPER    Patient Active Problem List   Diagnosis Date Noted   Lung nodule 03/18/2023   Renal mass, right 03/18/2023   Class 2 severe obesity with serious comorbidity and body mass index (BMI) of 37.0 to 37.9 in adult (HCC) 03/18/2023   Hyperlipidemia 03/17/2023   Renal cyst, acquired, 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   Pulmonary emphysema, unspecified emphysema type (HCC) 10/17/2021   Essential hypertension 10/17/2021   Statin declined 10/17/2021   Mood swings 04/24/2021   Elevated blood pressure reading 04/24/2021   Obesity  (BMI 30.0-34.9) 04/24/2021   Acute respiratory failure with hypoxia (HCC) 09/26/2020   CAD (coronary artery disease) 09/26/2020   COVID-19 virus infection 09/26/2020   Tobacco abuse 12/04/2019   Respiratory failure (HCC)    Hemoptysis 11/17/2019    Past Surgical History:  Procedure Laterality Date   CARDIAC CATHETERIZATION  12-09-2009  dr Ty Hilts   abnormal stress test (12-05-2009) widely patent coronaries present w/ evidence of diffuse atherosclerotic disease/  widely patent stented segments in the proximal and mid segment RCA/  LVEF 65%   CORONARY ANGIOPLASTY WITH STENT PLACEMENT  12-28-2008  dr berry   PCI and stenting of the proxmial and mid RCA using cutting balloon atherectomy (x2 DES, Xience)/  LVEF >60% w/ normal wall motion   ESOPHAGOGASTRODUODENOSCOPY N/A 11/17/2019   Procedure: ESOPHAGOGASTRODUODENOSCOPY (EGD);  Surgeon: Jeani Hawking, MD;  Location: [facility];  Service: Gastroenterology;  Laterality: N/A;   KNEE ARTHROSCOPY W/ MENISCECTOMY Right 2002   NEUROPLASTY / TRANSPOSITION ULNAR NERVE AT ELBOW Right 10/2014   dr Dorris Fetch BURSECTOMY Left 05/06/2017   Procedure: LEFT ELBOW OLECRANON BURSA EXCISION;  Surgeon: Bradly Bienenstock, MD;  Location[facility];  Service: Orthopedics;  Laterality: Left;   ORIF RIGHT ANKLE FX  2006   TONSILLECTOMY  age 57   TRANSTHORACIC ECHOCARDIOGRAM  03/30/2016   mild concentric LVH, ef 50-55%/  trivial TR  Home Medications    Prior to Admission medications   Medication Sig Start Date End Date Taking? Authorizing Provider  albuterol (VENTOLIN HFA) 108 (90 Base) MCG/ACT inhaler INHALE 2 PUFFS INTO THE LUNGS EVERY SIX HOURS AS NEEDED FOR WHEEZING OR SHORTNESS OF BREATH. 11/15/20  Yes Mikhail, Maryann, DO  amoxicillin-clavulanate (AUGMENTIN) 875-125 MG tablet Take 1 tablet by mouth every 12 (twelve) hours. 09/08/23  Yes Tomi Bamberger, PA-C  Aspirin 81 MG CAPS Aspirin 81 mg   Yes [provider]   atorvastatin (LIPITOR) 40 MG tablet Take 1 tablet (40 mg total) by mouth daily. 03/19/23  Yes Runell Gess, MD  benzonatate (TESSALON) 100 MG capsule Take 1 capsule (100 mg total) by mouth every 8 (eight) hours. 09/08/23  Yes Tomi Bamberger, PA-C  COMIRNATY syringe Inject 0.3 mLs into the muscle once. 07/12/23  Yes [provider]  escitalopram (LEXAPRO) 20 MG tablet Take 1 tablet (20 mg total) by mouth daily. 03/18/23  Yes Marcine Matar, MD  Pseudoeph-Doxylamine-DM-APAP (NYQUIL PO) Take by mouth.   Yes [provider]  Tiotropium Bromide Monohydrate (SPIRIVA RESPIMAT) 2.5 MCG/ACT AERS Inhale 2 puffs into the lungs daily. 08/24/22  Yes Kalman Shan, MD  vitamin B-12 (CYANOCOBALAMIN) 1000 MCG tablet Take 5,000 mcg by mouth daily.   Yes [provider]  Vitamin D, Cholecalciferol, 10 MCG (400 UNIT) CAPS Take 400 Units by mouth daily.   Yes [provider]  vitamin E 1000 UNIT capsule Take 1,000 Units by mouth daily.   Yes [provider]    Family History Family History  Problem Relation Age of Onset   Cancer Mother    Cancer Father     Social History Social History   Tobacco Use   Smoking status: Former    Current packs/day: 0.00    Average packs/day: 2.0 packs/day for 42.1 years (84.2 ttl pk-yrs)    Types: Cigarettes    Start date: 17    Quit date: 11/17/2019    Years since quitting: 3.8   Smokeless tobacco: Never   Tobacco comments:    since age 38  Vaping Use   Vaping status: Never Used  Substance Use Topics   Alcohol use: Yes    Comment: seldom   Drug use: No     Allergies   Codeine, Onion, and Bee venom   Review of Systems Review of Systems  Constitutional:  Negative for chills and fever.  HENT:  Positive for congestion and sinus pressure. Negative for ear pain and sore throat.   Eyes:  Negative for discharge and redness.  Respiratory:  Positive for cough. Negative for shortness of breath and wheezing.    Gastrointestinal:  Negative for abdominal pain, nausea and vomiting.  Neurological:  Positive for headaches.     Physical Exam Triage Vital Signs ED Triage Vitals  Encounter Vitals Group     BP 09/08/23 0927 134/89     Systolic BP Percentile --      Diastolic BP Percentile --      Pulse Rate 09/08/23 0927 90     Resp 09/08/23 0927 20     Temp 09/08/23 0927 98.1 F (36.7 C)     Temp Source 09/08/23 0927 Oral     SpO2 09/08/23 0927 99 %     Weight 09/08/23 0924 261 lb (118.4 kg)     Height 09/08/23 0924 5\' 11"  (1.803 m)     Head Circumference --      Peak Flow --  Pain Score 09/08/23 0921 4     Pain Loc --      Pain Education --      Exclude from Growth Chart --    No data found.  Updated Vital Signs BP 134/89 (BP Location: Right Arm)   Pulse 90   Temp 98.1 F (36.7 C) (Oral)   Resp 20   Ht 5\' 11"  (1.803 m)   Wt 261 lb (118.4 kg)   SpO2 99%   BMI 36.40 kg/m      Physical Exam Vitals and nursing note reviewed.  Constitutional:      General: He is not in acute distress.    Appearance: Normal appearance. He is not ill-appearing.  HENT:     Head: Normocephalic and atraumatic.     Nose: Congestion present.     Mouth/Throat:     Mouth: Mucous membranes are moist.     Pharynx: Oropharynx is clear. No oropharyngeal exudate or posterior oropharyngeal erythema.  Eyes:     Conjunctiva/sclera: Conjunctivae normal.  Cardiovascular:     Rate and Rhythm: Normal rate and regular rhythm.     Heart sounds: Normal heart sounds. No murmur heard. Pulmonary:     Effort: Pulmonary effort is normal. No respiratory distress.     Breath sounds: Normal breath sounds. No wheezing, rhonchi or rales.  Skin:    General: Skin is warm and dry.  Neurological:     Mental Status: He is alert.  Psychiatric:        Mood and Affect: Mood normal.        Thought Content: Thought content normal.      UC Treatments / Results  Labs (all labs ordered are listed, but only abnormal  results are displayed) Labs Reviewed - No data to display  EKG   Radiology No results found.  Procedures Procedures (including critical care time)  Medications Ordered in UC Medications - No data to display  Initial Impression / Assessment and Plan / UC Course  I have reviewed the triage vital signs and the nursing notes.  Pertinent labs & imaging results that were available during my care of the patient were reviewed by me and considered in my medical decision making (see chart for details).    Will treat to cover sinusitis with Augmentin.  Recommended follow-up if no gradual improvement with any further concerns.  Patient initially concerned that weeklong course would not be sufficient, recommended he call if no improvement by the end of the week and it may be considered to add another 3 days to antibiotic course.  Tessalon Perles prescribed for cough.  Recommended sooner follow-up with any further concerns.  Final Clinical Impressions(s) / UC Diagnoses   Final diagnoses:  Acute sinusitis, recurrence not specified, unspecified location   Discharge Instructions   None    ED Prescriptions     Medication Sig Dispense Auth. Provider   amoxicillin-clavulanate (AUGMENTIN) 875-125 MG tablet Take 1 tablet by mouth every 12 (twelve) hours. 14 tablet Erma Pinto F, PA-C   benzonatate (TESSALON) 100 MG capsule Take 1 capsule (100 mg total) by mouth every 8 (eight) hours. 21 capsule Tomi Bamberger, PA-C      PDMP not reviewed this encounter.   Tomi Bamberger, PA-C 09/08/23 [phone]

## 2023-09-14 ENCOUNTER — Telehealth: Payer: Self-pay | Admitting: Emergency Medicine

## 2023-09-14 MED ORDER — AMOXICILLIN-POT CLAVULANATE 875-125 MG PO TABS: 1.0000 | ORAL_TABLET | Freq: Two times a day (BID) | ORAL | 0 refills | Status: DC

## 2023-09-14 NOTE — Telephone Encounter (Signed)
Pt called requesting additional antibiotics; per RM ok to send in 3 additional days

## 2023-09-21 NOTE — Progress Notes (Signed)
Ct stable since June 2024 . Will discuss during OV 12/17.24
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
## 2023-09-27 NOTE — Patient Instructions (Addendum)
ICD-10-CM   1. COVID-19 long hauler manifesting chronic dyspnea  R06.09    U09.9     2. 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 Pulmonary emphysema, unspecified emphysema type (HCC)  J43.9     4. Right kidney mass  N28.89     5. Coronary artery disease involving native coronary artery of native heart without angina pectoris  I25.10     6. 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

## 2023-09-28 ENCOUNTER — Ambulatory Visit: Payer: PPO | Admitting: Internal Medicine

## 2023-09-28 ENCOUNTER — Encounter: Payer: Self-pay | Admitting: Internal Medicine

## 2023-09-28 VITALS — BP 122/76 | HR 87 | Ht 71.0 in | Wt 274.0 lb

## 2023-09-28 DIAGNOSIS — J439 Emphysema, unspecified: Secondary | ICD-10-CM | POA: Diagnosis not present

## 2023-09-28 DIAGNOSIS — J8410 Pulmonary fibrosis, unspecified: Secondary | ICD-10-CM | POA: Diagnosis not present

## 2023-09-28 DIAGNOSIS — Z7185 Encounter for immunization safety counseling: Secondary | ICD-10-CM

## 2023-09-28 DIAGNOSIS — U099 Post covid-19 condition, unspecified: Secondary | ICD-10-CM

## 2023-09-28 DIAGNOSIS — R911 Solitary pulmonary nodule: Secondary | ICD-10-CM

## 2023-09-28 DIAGNOSIS — Z87891 Personal history of nicotine dependence: Secondary | ICD-10-CM | POA: Diagnosis not present

## 2023-10-21 IMAGING — CT CT CHEST HIGH RESOLUTION
2 of 8 series · 12 of 36 positions shown, 15 images · non-contrast
Comparison: Multiple prior chest CTs, most recently 10/31/2020.

CLINICAL DATA: 55-year-old male with history of COVID pneumonia in
September 2020 complicated by long-term hospitalization. Intermittent
cough and persistent shortness of breath on exertion. Evaluate for
pulmonary fibrosis.



[Series 4: high resolution retro · axial · 0.94mm/px · z∈[-380,-93]mm · 9 of 345 slices shown, 12 images]
[im 29/345  mediastinal]
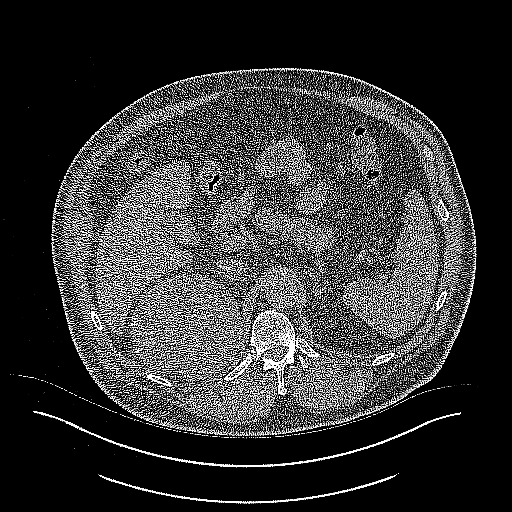
[im 29/345  lung]
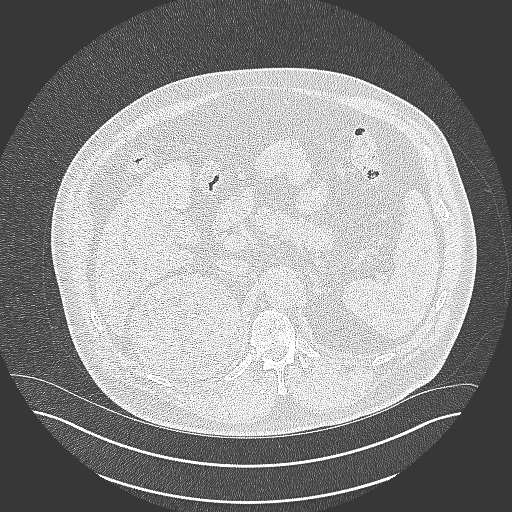
[im 58/345  lung]
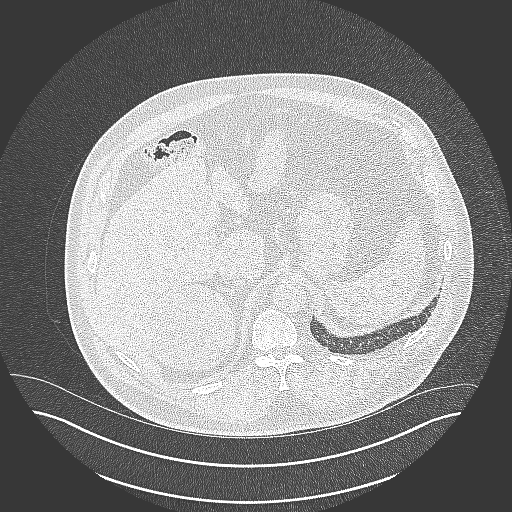
[im 115/345  lung]
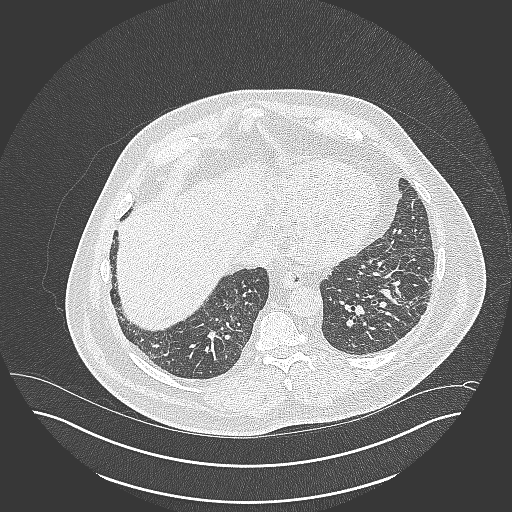
[im 144/345  lung]
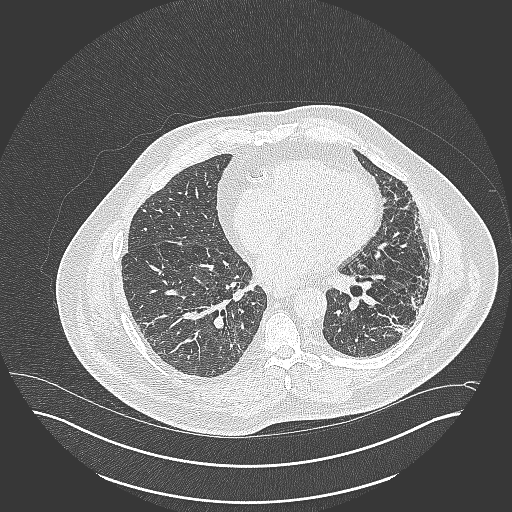
[im 173/345  mediastinal]
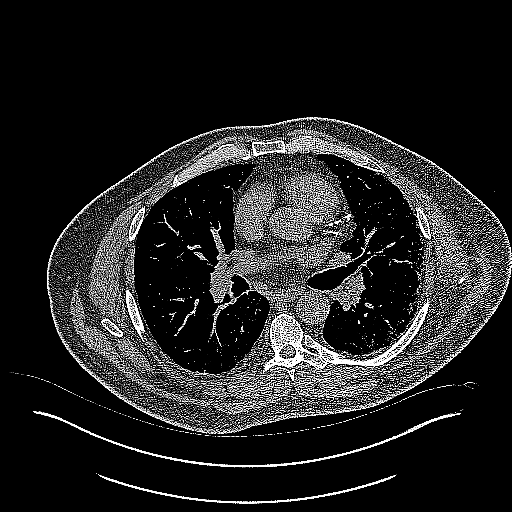
[im 173/345  lung]
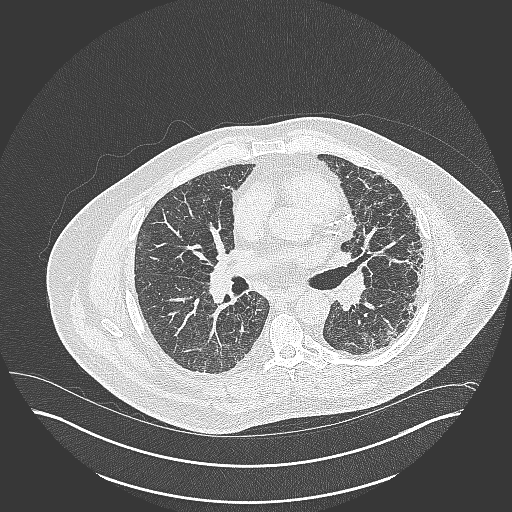
[im 201/345  lung]
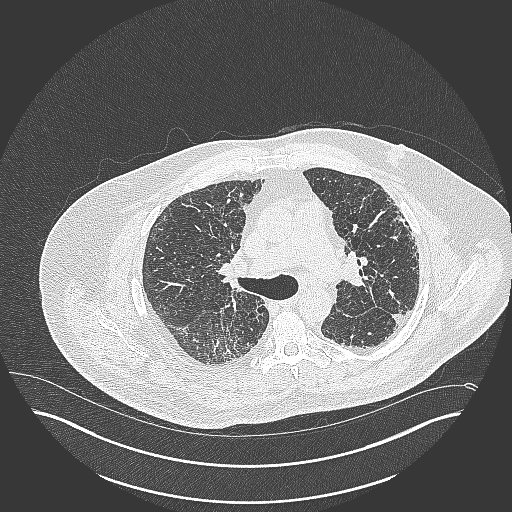
[im 230/345  lung]
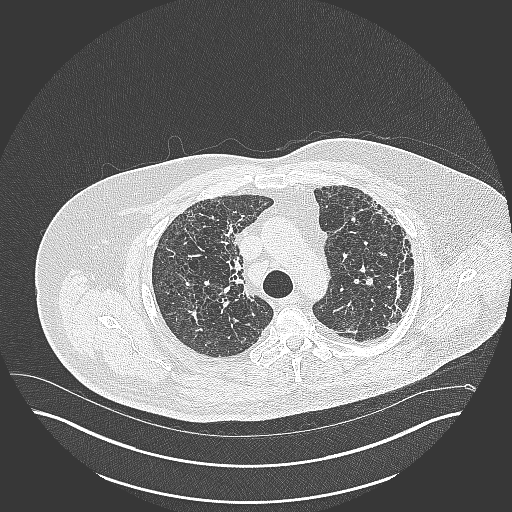
[im 287/345  lung]
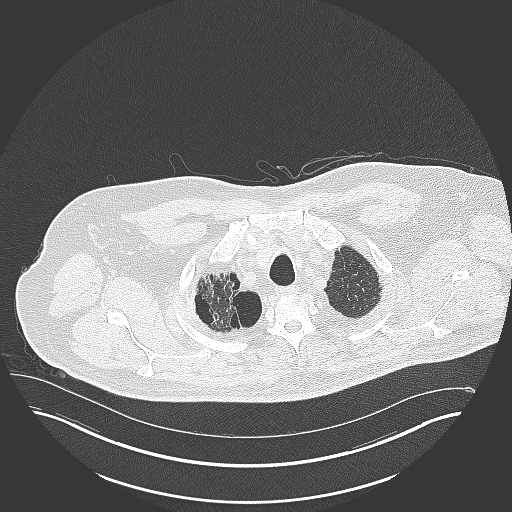
[im 316/345  mediastinal]
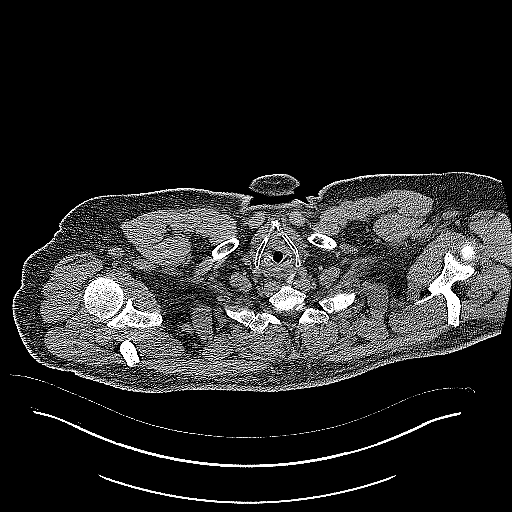
[im 316/345  lung]
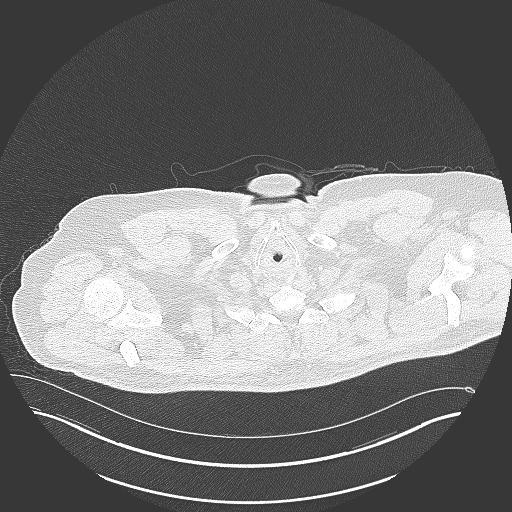

[Series 8: coronal · coronal · 0.67mm/px · 3 of 125 slices shown]
[im 25/125  lung]
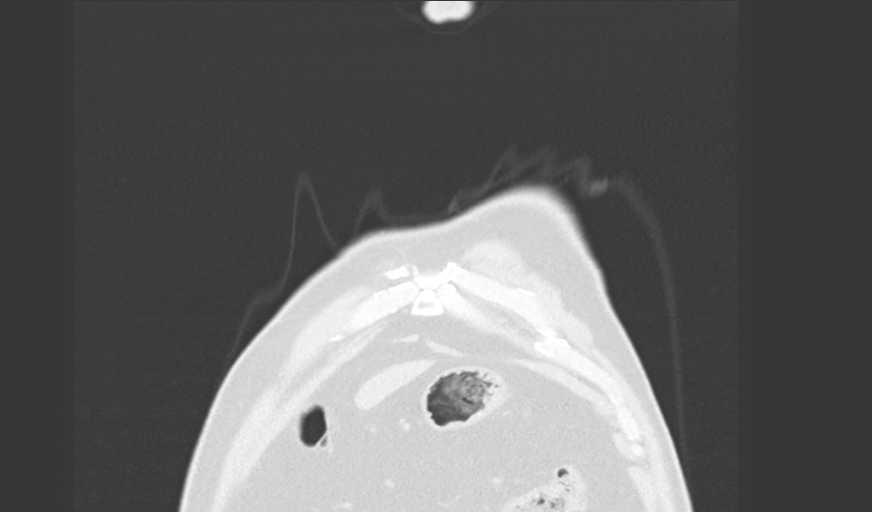
[im 50/125  lung]
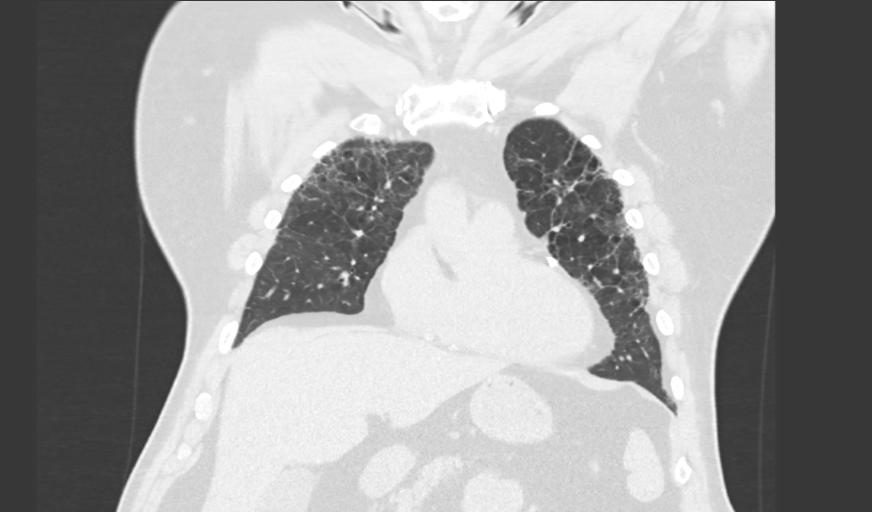
[im 75/125  lung]
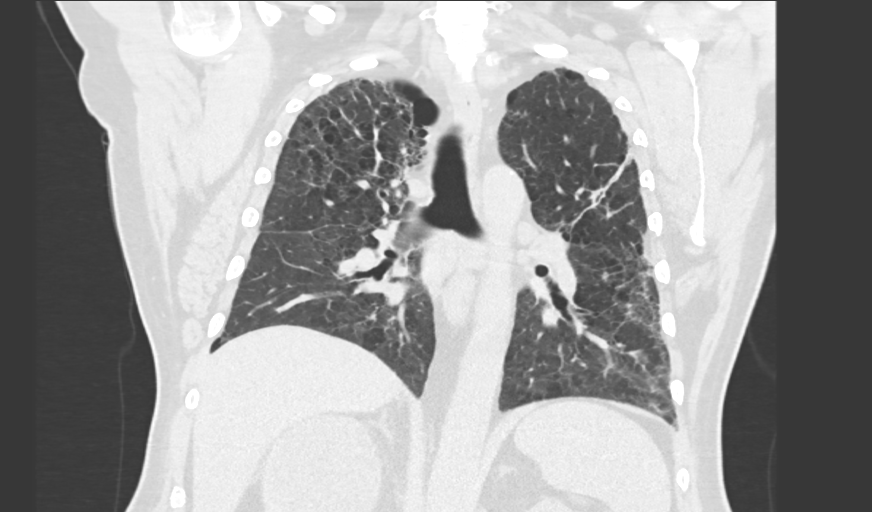

[12 of 36 positions shown; findings below may reference images not displayed]

FINDINGS: Cardiovascular: Heart size is normal. There is no significant
pericardial fluid, thickening or pericardial calcification. There is
aortic atherosclerosis, as well as atherosclerosis of the great
vessels of the mediastinum and the coronary arteries, including
calcified atherosclerotic plaque in the left main, left anterior
descending, left circumflex and right coronary arteries.

Mediastinum/Nodes: No pathologically enlarged mediastinal or hilar
lymph nodes. Small hiatal hernia. No axillary lymphadenopathy.

Lungs/Pleura: High-resolution images demonstrate diffuse bronchial
wall thickening with mild centrilobular and moderate paraseptal
emphysema. There also some patchy areas of mild ground-glass
attenuation, septal thickening, scattered regions of subpleural
reticulation, thickening of the peribronchovascular interstitium and
regional areas of architectural distortion. These findings have no
discernible craniocaudal gradient, and there are some areas of the
extreme lung bases which demonstrate relative sparing, with most
significant involvement in the mid to upper lung. Some areas appear
suspicious for developing honeycombing, however, this may simply
reflect fibrosis superimposed upon emphysema, as this is upper lobe
predominant. Inspiratory and expiratory imaging demonstrates minimal
air trapping, predominantly in the right lower lobe. No acute
confluent consolidative airspace disease. No pleural effusions.

Upper Abdomen: Aortic atherosclerosis. Large lesion in the upper
right retroperitoneum incompletely imaged, presumably an exophytic
lesion extending from the upper pole of the right kidney which
measures at least 10.6 x 10.0 cm and demonstrates some heterogeneous
internal attenuation with some amorphous intermediate attenuation in
the lateral aspect of the lesion, indicating some internal soft
tissue or other complexity.

Musculoskeletal: There are no aggressive appearing lytic or blastic
lesions noted in the visualized portions of the skeleton.
IMPRESSION: 1. There is clear evidence of pulmonary fibrosis, with a pattern
that is considered most compatible with an alternative diagnosis
(not usual interstitial pneumonia) per current ATS guidelines.
Overall, the distribution of disease is very similar to the pattern
of acute infection seen on prior CT examinations, most indicative of
severe post infectious fibrosis.
2. There is also diffuse bronchial wall thickening with mild
centrilobular and moderate paraseptal emphysema; imaging findings
suggestive of underlying COPD.
3. Aortic atherosclerosis, in addition to left main and three-vessel
coronary artery disease. Please note that although the presence of
coronary artery calcium documents the presence of coronary artery
disease, the severity of this disease and any potential stenosis
cannot be assessed on this non-gated CT examination. Assessment for
potential risk factor modification, dietary therapy or pharmacologic
therapy may be warranted, if clinically indicated.

Aortic Atherosclerosis ([id number].I).

## 2023-10-22 ENCOUNTER — Other Ambulatory Visit: Payer: Self-pay | Admitting: Urology

## 2023-10-22 DIAGNOSIS — N281 Cyst of kidney, acquired: Secondary | ICD-10-CM

## 2023-11-11 ENCOUNTER — Other Ambulatory Visit: Payer: PPO

## 2023-12-23 ENCOUNTER — Ambulatory Visit
Admission: RE | Admit: 2023-12-23 | Discharge: 2023-12-23 | Disposition: A | Discharge status: Home / Self Care | Payer: PPO | Source: Ambulatory Visit | Attending: Urology | Admitting: Urology

## 2023-12-23 DIAGNOSIS — N281 Cyst of kidney, acquired: Secondary | ICD-10-CM

## 2023-12-28 DIAGNOSIS — N281 Cyst of kidney, acquired: Secondary | ICD-10-CM | POA: Diagnosis not present

## 2023-12-28 DIAGNOSIS — Z125 Encounter for screening for malignant neoplasm of prostate: Secondary | ICD-10-CM | POA: Diagnosis not present

## 2024-01-03 DIAGNOSIS — Z125 Encounter for screening for malignant neoplasm of prostate: Secondary | ICD-10-CM | POA: Diagnosis not present

## 2024-01-03 DIAGNOSIS — N281 Cyst of kidney, acquired: Secondary | ICD-10-CM | POA: Diagnosis not present

## 2024-02-01 ENCOUNTER — Telehealth: Payer: Self-pay

## 2024-02-01 DIAGNOSIS — J302 Other seasonal allergic rhinitis: Secondary | ICD-10-CM

## 2024-02-01 MED ORDER — PREDNISONE 10 MG PO TABS: ORAL_TABLET | ORAL | 0 refills | Status: DC

## 2024-02-01 NOTE — Telephone Encounter (Signed)
   I sent   Please take prednisone  40 mg x1 day, then 30 mg x1 day, then 20 mg x1 day, then 10 mg x1 day, and then 5 mg x1 day and stop

## 2024-02-01 NOTE — Telephone Encounter (Signed)
 Copied from CRM [id number]. Topic: Clinical - Medical Advice >> Jan 31, 2024  8:50 AM Roseanne Cones wrote: Reason for CRM: Patient of Dr. Bertrum Brodie advised to call when high pollen impacts him - he has been taking Clartin D 24 hours - he feels congested in left lung and coughing recently. Patient would like to know if something can be called in for him?

## 2024-02-02 ENCOUNTER — Other Ambulatory Visit: Payer: Self-pay | Admitting: Pharmacist

## 2024-02-02 MED ORDER — ATORVASTATIN CALCIUM 40 MG PO TABS: 40.0000 mg | ORAL_TABLET | Freq: Every day | ORAL | 0 refills | Status: DC

## 2024-02-02 NOTE — Progress Notes (Signed)
 Pharmacy Quality Measure Review  This patient is appearing on a report for being at risk of failing the adherence measure for cholesterol (statin) medications this calendar year.   Medication: atorvastatin  Last fill date: 11/02/2023 for 90 day supply  Collaborated with patient's pharmacy. Refill sent for a 90-day supply.   Marene Shape, PharmD, Becky Bowels, CPP Clinical Pharmacist [facility] [phone]

## 2024-02-02 NOTE — Telephone Encounter (Signed)
 Spoke with patient regarding prior message . Advised patient Dr.Ramaswamy has sent in prednisone  to his pharmacy.Patient has thanked our office  Patient's voice was understanding.Nothing else further needed.

## 2024-02-09 ENCOUNTER — Encounter: Payer: Self-pay | Admitting: Pharmacist

## 2024-02-09 ENCOUNTER — Other Ambulatory Visit (HOSPITAL_BASED_OUTPATIENT_CLINIC_OR_DEPARTMENT_OTHER): Payer: Self-pay | Admitting: Pharmacist

## 2024-02-09 DIAGNOSIS — E785 Hyperlipidemia, unspecified: Secondary | ICD-10-CM

## 2024-02-09 NOTE — Progress Notes (Signed)
 Pharmacy Quality Measure Review  This patient is appearing on a report for being at risk of failing the adherence measure for cholesterol (statin) medications this calendar year.   Medication: atorvastatin  Last fill date: 11/02/2023 for 90 day supply  Collaborated with patient's pharmacy last week (02/02/2024). Refill sent for a 90-day supply. However, patient has not filled this. Contacted pharmacy to facilitate refills. They contacted patient and he refused refill.   Marene Shape, PharmD, Becky Bowels, CPP Clinical Pharmacist [facility] [phone]

## 2024-02-13 ENCOUNTER — Ambulatory Visit

## 2024-02-13 ENCOUNTER — Ambulatory Visit
Admission: EM | Admit: 2024-02-13 | Discharge: 2024-02-13 | Disposition: A | Discharge status: Home / Self Care | Attending: Nurse Practitioner | Admitting: Nurse Practitioner

## 2024-02-13 ENCOUNTER — Encounter: Payer: Self-pay | Admitting: *Deleted

## 2024-02-13 DIAGNOSIS — J329 Chronic sinusitis, unspecified: Secondary | ICD-10-CM

## 2024-02-13 DIAGNOSIS — J40 Bronchitis, not specified as acute or chronic: Secondary | ICD-10-CM

## 2024-02-13 MED ORDER — AMOXICILLIN-POT CLAVULANATE 875-125 MG PO TABS: 1.0000 | ORAL_TABLET | Freq: Two times a day (BID) | ORAL | 0 refills | Status: DC

## 2024-02-13 MED ORDER — PSEUDOEPH-BROMPHEN-DM 30-2-10 MG/5ML PO SYRP: 10.0000 mL | ORAL_SOLUTION | Freq: Four times a day (QID) | ORAL | 0 refills | Status: DC | PRN

## 2024-02-13 MED ORDER — BENZONATATE 100 MG PO CAPS: 100.0000 mg | ORAL_CAPSULE | Freq: Three times a day (TID) | ORAL | 0 refills | Status: DC | PRN

## 2024-02-13 MED ORDER — PREDNISONE 10 MG (21) PO TBPK: ORAL_TABLET | Freq: Every day | ORAL | 0 refills | Status: DC

## 2024-02-13 MED ORDER — DEXAMETHASONE SODIUM PHOSPHATE 10 MG/ML IJ SOLN
10.0000 mg | Freq: Once | INTRAMUSCULAR | Status: AC
  Administered 2024-02-13: 10 mg via INTRAVENOUS

## 2024-02-13 MED ORDER — IPRATROPIUM-ALBUTEROL 0.5-2.5 (3) MG/3ML IN SOLN
3.0000 mL | Freq: Once | RESPIRATORY_TRACT | Status: AC
  Administered 2024-02-13: 3 mL via RESPIRATORY_TRACT

## 2024-02-13 NOTE — ED Provider Notes (Signed)
 [facility]    CSN: [id number] Arrival date & time: 02/13/24  5038      History   Chief Complaint Chief Complaint  Patient presents with   Cough    HPI Matthew [name] is a 58 y.o. male.   Matthew [name] is a  58 year old male presenting for evaluation of a persistent cough that has been ongoing for the past three weeks. He initially attributed the symptoms to seasonal pollen, but the cough has not improved. The cough is productive and accompanied by chest congestion, wheezing, sinus pressure, and occasional headaches. The patient has a history of pulmonary complications following a prior COVID infection and is followed by pulmonology. His current maintenance therapy includes Spiriva , and he uses albuterol  as needed. He has also been taking Claritin-D daily. On February 01, 2024, he contacted his pulmonologist and was prescribed a 5-day course of prednisone , which he has since completed and found helpful. He has also been using Tessalon  Perles for symptom relief. He has a remote history of smoking but quit several years ago.  The following portions of the patient's history were reviewed and updated as appropriate: allergies, current medications, past family history, past medical history, past social history, past surgical history, and problem list.      Past Medical History:  Diagnosis Date   Coronary artery disease per pt last cardiology visit 2011 (found documentation 12-05-2009 w/ dr Dortha Gauss with [facility] in epic)/  currently pt is followed by pcp   12-28-2008  PCI and DES x2 to proximal and mid RCA/ last cardiac cath 12-09-2009  widely patent stents   GERD (gastroesophageal reflux disease)    Hyperlipidemia    Olecranon bursitis of left elbow    Respiratory failure (HCC)    S/P right coronary artery (RCA) stent placement 12/28/2008   PCI and DES x2-- proximal and mid RCA   Wears dentures    UPPER    Patient Active Problem List   Diagnosis Date Noted   Lung  nodule 03/18/2023   Renal mass, right 03/18/2023   Class 2 severe obesity with serious comorbidity and body mass index (BMI) of 37.0 to 37.9 in adult (HCC) 03/18/2023   Hyperlipidemia 03/17/2023   Renal cyst, acquired, 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   Pulmonary emphysema, unspecified emphysema type (HCC) 10/17/2021   Essential hypertension 10/17/2021   Statin declined 10/17/2021   Mood swings 04/24/2021   Elevated blood pressure reading 04/24/2021   Obesity (BMI 30.0-34.9) 04/24/2021   Acute respiratory failure with hypoxia (HCC) 09/26/2020   CAD (coronary artery disease) 09/26/2020   COVID-19 virus infection 09/26/2020   Tobacco abuse 12/04/2019   Respiratory failure (HCC)    Hemoptysis 11/17/2019    Past Surgical History:  Procedure Laterality Date   CARDIAC CATHETERIZATION  12-09-2009  dr Marla Sills   abnormal stress test (12-05-2009) widely patent coronaries present w/ evidence of diffuse atherosclerotic disease/  widely patent stented segments in the proximal and mid segment RCA/  LVEF 65%   CORONARY ANGIOPLASTY WITH STENT PLACEMENT  12-28-2008  dr berry   PCI and stenting of the proxmial and mid RCA using cutting balloon atherectomy (x2 DES, Xience)/  LVEF >60% w/ normal wall motion   ESOPHAGOGASTRODUODENOSCOPY N/A 11/17/2019   Procedure: ESOPHAGOGASTRODUODENOSCOPY (EGD);  Surgeon: Alvis Jourdain, MD;  Location: [facility];  Service: Gastroenterology;  Laterality: N/A;   KNEE ARTHROSCOPY W/ MENISCECTOMY Right 2002   NEUROPLASTY / TRANSPOSITION ULNAR NERVE AT ELBOW Right 10/2014   dr Kendell Pavlov BURSECTOMY Left  05/06/2017   Procedure: LEFT ELBOW OLECRANON BURSA EXCISION;  Surgeon: Arvil Birks, MD;  Location: [facility];  Service: Orthopedics;  Laterality: Left;   ORIF RIGHT ANKLE FX  2006   TONSILLECTOMY  age 69   TRANSTHORACIC ECHOCARDIOGRAM  03/30/2016   mild concentric LVH, ef 50-55%/  trivial TR       Home Medications    Prior to Admission medications    Medication Sig Start Date End Date Taking? Authorizing Provider  albuterol  (VENTOLIN  HFA) 108 (90 Base) MCG/ACT inhaler INHALE 2 PUFFS INTO THE LUNGS EVERY SIX HOURS AS NEEDED FOR WHEEZING OR SHORTNESS OF BREATH. 11/15/20  Yes Mikhail, Maryann, DO  amoxicillin -clavulanate (AUGMENTIN ) 875-125 MG tablet Take 1 tablet by mouth every 12 (twelve) hours. 02/13/24  Yes Maryruth Sol, FNP  Aspirin  81 MG CAPS Aspirin  81 mg   Yes [provider]  atorvastatin  (LIPITOR) 40 MG tablet Take 1 tablet (40 mg total) by mouth daily. Please schedule PCP visit with Dr. Lincoln Renshaw for additional refills. 02/02/24 05/02/24 Yes Lawrance Presume, MD  brompheniramine-pseudoephedrine-DM 30-2-10 MG/5ML syrup Take 10 mLs by mouth every 6 (six) hours as needed (as needed for cough and congestion). 02/13/24  Yes Lumen Brinlee, Ova Bloomer, FNP  predniSONE  (STERAPRED UNI-PAK 21 TAB) 10 MG (21) TBPK tablet Take by mouth daily. Take 6 tabs by mouth daily  for 2 days, then 5 tabs for 2 days, then 4 tabs for 2 days, then 3 tabs for 2 days, 2 tabs for 2 days, then 1 tab by mouth daily for 2 days 02/13/24  Yes Zionah Criswell, [place], FNP  Tiotropium Bromide Monohydrate  (SPIRIVA  RESPIMAT) 2.5 MCG/ACT AERS Inhale 2 puffs into the lungs daily. 08/24/22  Yes Maire Scot, MD  vitamin B-12 (CYANOCOBALAMIN) 1000 MCG tablet Take 5,000 mcg by mouth daily.   Yes [provider]  Vitamin D, Cholecalciferol, 10 MCG (400 UNIT) CAPS Take 400 Units by mouth daily.   Yes [provider]  benzonatate  (TESSALON ) 100 MG capsule Take 1 capsule (100 mg total) by mouth 3 (three) times daily as needed for cough. 02/13/24   Maryruth Sol, FNP  COMIRNATY  syringe Inject 0.3 mLs into the muscle once. 07/12/23   [provider]  escitalopram  (LEXAPRO ) 20 MG tablet Take 1 tablet (20 mg total) by mouth daily. Patient not taking: Reported on 02/13/2024 03/18/23   Lawrance Presume, MD  vitamin E 1000 UNIT capsule Take 1,000 Units by mouth daily.     [provider]    Family History Family History  Problem Relation Age of Onset   Cancer Mother    Cancer Father     Social History Social History   Tobacco Use   Smoking status: Former    Current packs/day: 0.00    Average packs/day: 2.0 packs/day for 42.1 years (84.2 ttl pk-yrs)    Types: Cigarettes    Start date: 61    Quit date: 11/17/2019    Years since quitting: 4.2   Smokeless tobacco: Never   Tobacco comments:    since age 43  Vaping Use   Vaping status: Never Used  Substance Use Topics   Alcohol use: Yes    Comment: seldom   Drug use: No     Allergies   Codeine, Onion, and Bee venom   Review of Systems Review of Systems  Constitutional:  Negative for chills and fever.  HENT:  Positive for congestion and sinus pressure. Negative for ear pain, postnasal drip, rhinorrhea, sinus pain, sneezing, sore throat  and tinnitus.   Respiratory:  Positive for cough and wheezing (mainly at night). Negative for shortness of breath.   Gastrointestinal:  Negative for diarrhea, nausea and vomiting.  Musculoskeletal:  Negative for myalgias.  Neurological:  Positive for headaches (sometimes, "sinus" headache).  All other systems reviewed and are negative.    Physical Exam Triage Vital Signs ED Triage Vitals  Encounter Vitals Group     BP 02/13/24 0943 (!) 146/93     Systolic BP Percentile --      Diastolic BP Percentile --      Pulse Rate 02/13/24 0943 80     Resp 02/13/24 0943 18     Temp 02/13/24 0943 97.7 F (36.5 C)     Temp Source 02/13/24 0943 Oral     SpO2 02/13/24 0943 93 %     Weight --      Height --      Head Circumference --      Peak Flow --      Pain Score 02/13/24 0944 0     Pain Loc --      Pain Education --      Exclude from Growth Chart --    No data found.  Updated Vital Signs BP (!) 146/93 (BP Location: Right Arm)   Pulse 80   Temp 97.7 F (36.5 C) (Oral)   Resp 18   SpO2 93%   Visual Acuity Right Eye Distance:   Left  Eye Distance:   Bilateral Distance:    Right Eye Near:   Left Eye Near:    Bilateral Near:     Physical Exam Vitals reviewed.  Constitutional:      General: He is awake. He is not in acute distress.    Appearance: Normal appearance. He is well-developed. He is not ill-appearing, toxic-appearing or diaphoretic.  HENT:     Head: Normocephalic.     Nose: Congestion present.     Mouth/Throat:     Mouth: Mucous membranes are moist.     Pharynx: Oropharynx is clear. Uvula midline. No pharyngeal swelling or posterior oropharyngeal erythema.  Eyes:     Conjunctiva/sclera: Conjunctivae normal.  Cardiovascular:     Rate and Rhythm: Normal rate and regular rhythm.     Heart sounds: Normal heart sounds.  Pulmonary:     Effort: Pulmonary effort is normal. No tachypnea or respiratory distress.     Breath sounds: Normal air entry. No decreased air movement. Examination of the left-lower field reveals decreased breath sounds. Decreased breath sounds present. No wheezing, rhonchi or rales.  Abdominal:     Palpations: Abdomen is soft.  Musculoskeletal:        General: Normal range of motion.     Cervical back: Full passive range of motion without pain, normal range of motion and neck supple.  Skin:    General: Skin is warm and dry.  Neurological:     General: No focal deficit present.     Mental Status: He is alert and oriented to person, place, and time.     UC Treatments / Results  Labs (all labs ordered are listed, but only abnormal results are displayed) Labs Reviewed - No data to display  EKG   Radiology No results found.  Procedures Procedures (including critical care time)  Medications Ordered in UC Medications  ipratropium-albuterol  (DUONEB) 0.5-2.5 (3) MG/3ML nebulizer solution 3 mL (3 mLs Nebulization Given 02/13/24 1044)  dexamethasone  (DECADRON ) injection 10 mg (10 mg Intravenous Given 02/13/24 1045)  Initial Impression / Assessment and Plan / UC Course  I have  reviewed the triage vital signs and the nursing notes.  Pertinent labs & imaging results that were available during my care of the patient were reviewed by me and considered in my medical decision making (see chart for details).    58 year old male presenting with a persistent productive cough, chest congestion, and sinus pressure. He is afebrile and nontoxic. On exam, breath sounds are decreased bilaterally without wheezing. Respirations are even and unlabored. He was treated in the clinic with a Duoneb nebulizer treatment and a Decadron  injection. A prescription for Augmentin , a Medrol  Dosepak, and Bronfed DM was provided. At the patient's request, a refill of Tessalon  Perles was also provided. He was advised to discontinue Claritin-D due to potential overlap and side effects with his current medications. Supportive care measures were reviewed, and emergency department precautions discussed. Follow-up with his primary care provider and pulmonologist was recommended as needed based on symptom progression.  Today's evaluation has revealed no signs of a dangerous process. Discussed diagnosis with patient and/or guardian. Patient and/or guardian aware of their diagnosis, possible red flag symptoms to watch out for and need for close follow up. Patient and/or guardian understands verbal and written discharge instructions. Patient and/or guardian comfortable with plan and disposition.  Patient and/or guardian has a clear mental status at this time, good insight into illness (after discussion and teaching) and has clear judgment to make decisions regarding their care  Documentation was completed with the aid of voice recognition software. Transcription may contain typographical errors. Final Clinical Impressions(s) / UC Diagnoses   Final diagnoses:  Sinobronchitis     Discharge Instructions      You have been diagnosed with sinobronchitis, which is an infection and inflammation of the sinuses and  bronchial tubes. This often causes symptoms such as congestion, cough, and chest tightness. Today, you were treated in the clinic with a Decadron  injection, which helps reduce inflammation, and a DuoNeb breathing treatment to open your airways and improve breathing.  Continue taking the prescribed antibiotic and steroid as directed. It is important to complete the full course of these medications even if you start to feel better. Stop taking Claritin-D and any other over-the-counter cold, sinus, or cough medications, as these may interfere with your prescribed treatment. Use the prescribed cough syrup as needed for symptom relief--it also contains a decongestant to help with sinus and chest congestion.  You may continue taking benzonatate  for cough; a refill has been provided at your request. Also, continue using your Spiriva  and albuterol  inhalers as directed. Avoid known allergens and irritants that may worsen your symptoms. Supportive care such as hydration, rest, and using a humidifier may also be helpful.  Go to the emergency department if you experience worsening shortness of breath, chest pain, high fever, or if you are unable to keep down fluids. Follow up with your primary care provider or pulmonologist as needed.        ED Prescriptions     Medication Sig Dispense Auth. Provider   brompheniramine-pseudoephedrine-DM 30-2-10 MG/5ML syrup Take 10 mLs by mouth every 6 (six) hours as needed (as needed for cough and congestion). 120 mL Maryruth Sol, FNP   predniSONE  (STERAPRED UNI-PAK 21 TAB) 10 MG (21) TBPK tablet Take by mouth daily. Take 6 tabs by mouth daily  for 2 days, then 5 tabs for 2 days, then 4 tabs for 2 days, then 3 tabs for 2 days, 2 tabs for 2  days, then 1 tab by mouth daily for 2 days 42 tablet Maryruth Sol, FNP   amoxicillin -clavulanate (AUGMENTIN ) 875-125 MG tablet Take 1 tablet by mouth every 12 (twelve) hours. 14 tablet Arlyn Buerkle, [place], FNP   benzonatate   (TESSALON ) 100 MG capsule Take 1 capsule (100 mg total) by mouth 3 (three) times daily as needed for cough. 30 capsule Maryruth Sol, FNP      PDMP not reviewed this encounter.   Maryruth Sol, [place] 02/13/24 1640

## 2024-02-13 NOTE — ED Triage Notes (Signed)
 Cough x 3+ weeks, "sometimes I cough  up white phlegm". Just finished prednisone . Taking claritin-D and tessalon 

## 2024-02-13 NOTE — Discharge Instructions (Addendum)
 You have been diagnosed with sinobronchitis, which is an infection and inflammation of the sinuses and bronchial tubes. This often causes symptoms such as congestion, cough, and chest tightness. Today, you were treated in the clinic with a Decadron  injection, which helps reduce inflammation, and a DuoNeb breathing treatment to open your airways and improve breathing.  Continue taking the prescribed antibiotic and steroid as directed. It is important to complete the full course of these medications even if you start to feel better. Stop taking Claritin-D and any other over-the-counter cold, sinus, or cough medications, as these may interfere with your prescribed treatment. Use the prescribed cough syrup as needed for symptom relief--it also contains a decongestant to help with sinus and chest congestion.  You may continue taking benzonatate  for cough; a refill has been provided at your request. Also, continue using your Spiriva  and albuterol  inhalers as directed. Avoid known allergens and irritants that may worsen your symptoms. Supportive care such as hydration, rest, and using a humidifier may also be helpful.  Go to the emergency department if you experience worsening shortness of breath, chest pain, high fever, or if you are unable to keep down fluids. Follow up with your primary care provider or pulmonologist as needed.

## 2024-02-17 ENCOUNTER — Other Ambulatory Visit: Payer: Self-pay | Admitting: Internal Medicine

## 2024-02-21 DIAGNOSIS — N281 Cyst of kidney, acquired: Secondary | ICD-10-CM | POA: Diagnosis not present

## 2024-02-21 DIAGNOSIS — R310 Gross hematuria: Secondary | ICD-10-CM | POA: Diagnosis not present

## 2024-02-25 DIAGNOSIS — N281 Cyst of kidney, acquired: Secondary | ICD-10-CM | POA: Diagnosis not present

## 2024-02-25 DIAGNOSIS — R310 Gross hematuria: Secondary | ICD-10-CM | POA: Diagnosis not present

## 2024-02-28 ENCOUNTER — Ambulatory Visit: Payer: Self-pay | Admitting: Internal Medicine

## 2024-02-28 NOTE — Telephone Encounter (Signed)
Please advise [name].

## 2024-02-28 NOTE — Telephone Encounter (Addendum)
 Chief Complaint: productive cough Symptoms: wheezing, exertional SOB Frequency: x few weeks Pertinent Negatives: Patient denies fever, URI sx, CP, severe SOB Disposition: [] ED /[] Urgent Care (no appt availability in office) / [] Appointment(In office/virtual)/ []  [facility]/ [] Home Care/ [] Refused Recommended Disposition /[] [facility]/ [x]  Follow-up with Pulm Additional Notes: Pt c/o productive cough, congestion, wheezing, and exertional SOB. Pt recently went to UC and prescribed abx, steroids, and cough medicine. Pt reports finishing all medications as prescribed, and still having sx. Pt reports not taking respiratory medications regularly as instructed. Triager reinforced maintenance INH usage. Triager does not appreciate audible SOB/wheezing during call. Pt is speaking in full sentences. Triager attempted to schedule, but primary Pulmonologist has no availability and pt does not want to see alternate Pulm provider. Triager will forward encounter for Dr. Bertrum Brodie 's office to review and advise. Patient verbalized understanding and is expecting call back from office for next steps. Triager also advised that if pt does not hear back from office, to follow disposition for further evaluation/treatment.      Copied from CRM [id number]. Topic: Clinical - Red Word Triage >> Feb 28, 2024  8:04 AM Evie Hoff wrote: [facility] that prompted transfer to Nurse Triage: congestion and a bad severe cough . Went to hospital finished all medicine but still feeling bad wants the doc to call in some antibiotics Diagnosed with bronchitis Reason for Disposition  Wheezing is present  Answer Assessment - Initial Assessment Questions E2C2 Pulmonary Triage - Initial Assessment Questions "Chief Complaint (e.g., cough, sob, wheezing, fever, chills, sweat or additional symptoms) *Go to specific symptom protocol after initial questions. Productive white Cough, wheezing and congestion x few  weeks Recently went to UC last Sunday - Rx amoxicillin , prednisone  and cough syrup - pt reports finished abx and steroids - sx still present  "How long have symptoms been present?" A few weeks  Have you tested for COVID or Flu? Note: If not, ask patient if a home test can be taken. If so, instruct patient to call back for positive results. No, denies URI sx  MEDICINES:   "Have you used any OTC meds to help with symptoms?" No If yes, ask "What medications?" N/a  "Have you used your inhalers/maintenance medication?" Yes If yes, "What medications?" Spiriva  - reports taking PRN  If inhaler, ask "How many puffs and how often?" Note: Review instructions on medication in the chart. See above  OXYGEN: "Do you wear supplemental oxygen?" No If yes, "How many liters are you supposed to use?" N/a  "Do you monitor your oxygen levels?" Yes If yes, "What is your reading (oxygen level) today?" 94-96  "What is your usual oxygen saturation reading?"  (Note: Pulmonary O2 sats should be 90% or greater) Mid 90s    3. SPUTUM: "Describe the color of your sputum" (none, dry cough; clear, white, yellow, green)     white 4. HEMOPTYSIS: "Are you coughing up any blood?" If so ask: "How much?" (flecks, streaks, tablespoons, etc.)     denies 5. DIFFICULTY BREATHING: "Are you having difficulty breathing?" If Yes, ask: "How bad is it?" (e.g., mild, moderate, severe)    - MILD: No SOB at rest, mild SOB with walking, speaks normally in sentences, can lie down, no retractions, pulse < 100.    - MODERATE: SOB at rest, SOB with minimal exertion and prefers to sit, cannot lie down flat, speaks in phrases, mild retractions, audible wheezing, pulse 100-120.    - SEVERE: Very SOB at rest,  speaks in single words, struggling to breathe, sitting hunched forward, retractions, pulse > 120      Mild - with exertion 6. FEVER: "Do you have a fever?" If Yes, ask: "What is your temperature, how was it measured, and when  did it start?"     denies 7. CARDIAC HISTORY: "Do you have any history of heart disease?" (e.g., heart attack, congestive heart failure)      Denies Endorses 2 stents 8. LUNG HISTORY: "Do you have any history of lung disease?"  (e.g., pulmonary embolus, asthma, emphysema)     Emphysema, nodules 9. PE RISK FACTORS: "Do you have a history of blood clots?" (or: recent major surgery, recent prolonged travel, bedridden)     Hx of PE February 2021 - self resolved per pt 10. OTHER SYMPTOMS: "Do you have any other symptoms?" (e.g., runny nose, wheezing, chest pain)       Wheezing Denies CP  Protocols used: Cough - Acute Productive-A-AH

## 2024-02-29 MED ORDER — AZITHROMYCIN 250 MG PO TABS: ORAL_TABLET | ORAL | 0 refills | Status: DC

## 2024-02-29 MED ORDER — PREDNISONE 10 MG PO TABS: ORAL_TABLET | ORAL | 0 refills | Status: DC

## 2024-02-29 NOTE — Telephone Encounter (Signed)
 I called and spoke with the pt and notified of response per MR  He verbalized understanding  Rxs were sent to preferred pharm

## 2024-02-29 NOTE — Telephone Encounter (Signed)
 Well sometimes cough lingers for a while but he can try another reound of abx and pred. I Am traveling from [address] so please call this Rx in for him pleae   Z PAK and also  Please take prednisone  40 mg x1 day, then 30 mg x1 day, then 20 mg x1 day, then 10 mg x1 day, and then 5 mg x1 day and stop     Allergies  Allergen Reactions   Codeine Anaphylaxis and Nausea And Vomiting   Onion Anaphylaxis and Swelling    "thoart swells up"   Bee Venom Hives

## 2024-02-29 NOTE — Addendum Note (Signed)
 Addended by: Novalee Horsfall M on: 02/29/2024 09:38 AM   Modules accepted: Orders

## 2024-03-09 ENCOUNTER — Ambulatory Visit: Payer: Self-pay

## 2024-03-09 NOTE — Telephone Encounter (Signed)
 Copied from CRM [id number]. Topic: Clinical - Red Word Triage >> Mar 09, 2024  1:06 PM Whitney O wrote: [facility] that prompted transfer to Nurse Triage: talk to triage nurse last week got with dr Bertrum Brodie and gave me a zpack and other stuff i still have congestion and i still got the cough severe the bronchitis is kicking my buttswam Talk to triage nurse on 5/19 patient still feeling bad  [phone] please give patient a call back   Chief Complaint: Infection on Antibiotic Follow Up Symptoms: Cough, Congestion Frequency: Ongoing Pertinent Negatives: Patient denies fever, chest pain, dyspnea  Disposition: [] ED /[] Urgent Care (no appt availability in office) / [] Appointment(In office/virtual)/ []  [facility] [facility]/ [] Home Care/ [] Refused Recommended Disposition /[] [facility]/ [x]  Follow-up with Pulmonologist  TRIAGE SUMMARY: The patient is requesting that another medication is sent to this pharmacy since his symptoms are not better. If any further actions are needed the patient asks to be contacted.   E2C2 Pulmonary Triage - Initial Assessment Questions "Chief Complaint (e.g., cough, sob, wheezing, fever, chills, sweat or additional symptoms) *Go to specific symptom protocol after initial questions. Cough, Congestion after antibiotic Treatment  "How long have symptoms been present?" Ongoing  Have you tested for COVID or Flu? Note: If not, ask patient if a home test can be taken. If so, instruct patient to call back for positive results. Yes  MEDICINES:   "Have you used any OTC meds to help with symptoms?" No If yes, ask "What medications?" No  OXYGEN: "Do you wear supplemental oxygen?" No If yes, "How many liters are you supposed to use?" Does not wear  "Do you monitor your oxygen levels?" No If yes, "What is your reading (oxygen level) today?" Unsure  "What is your usual oxygen saturation reading?"  (Note: Pulmonary O2 sats should be 90% or greater) Does not  monitor   Answer Assessment - Initial Assessment Questions 1. INFECTION: "What infection is the antibiotic being given for?"     Upper Respiratory  2. ANTIBIOTIC: "What antibiotic are you taking" "How many times per day?"     Azithromycin , ZPack  3. DURATION: "When was the antibiotic started?"     5 Days  4. MAIN CONCERN OR SYMPTOM:  "What is your main concern right now?"     Cough, Congestion  5. BETTER-SAME-WORSE: "Are you getting better, staying the same, or getting worse compared to when you first started the antibiotics?" If getting worse, ask: "In what way?"      Same  6. FEVER: "Do you have a fever?" If Yes, ask: "What is your temperature, how was it measured, and when did it start?"       7. SYMPTOMS: "Are there any other symptoms you're concerned about?" If Yes, ask: "When did it start?"     Cough and Cognestion  8. FOLLOW-UP APPOINTMENT: "Do you have a follow-up appointment with your doctor?"     No  Protocols used: Infection on Antibiotic Follow-up Call-A-AH

## 2024-03-10 NOTE — Telephone Encounter (Signed)
Please advise on rx.

## 2024-03-14 ENCOUNTER — Telehealth: Payer: Self-pay

## 2024-03-14 ENCOUNTER — Other Ambulatory Visit: Payer: Self-pay | Admitting: Pulmonary Disease

## 2024-03-14 MED ORDER — BENZONATATE 200 MG PO CAPS: 200.0000 mg | ORAL_CAPSULE | Freq: Three times a day (TID) | ORAL | 1 refills | Status: DC | PRN

## 2024-03-14 NOTE — Telephone Encounter (Signed)
 Copied from CRM [id number]. Topic: Clinical - Medical Advice >> Mar 14, 2024  8:48 AM Isabell A wrote: Reason for CRM: Patient states his bronchitis and cough has subsided, thank you for the meds. Feeling 100% better - states he was using Delsym  maximum strength.  Noted.

## 2024-03-21 ENCOUNTER — Ambulatory Visit: Admitting: Pulmonary Disease

## 2024-03-21 ENCOUNTER — Ambulatory Visit: Admitting: Internal Medicine

## 2024-03-21 DIAGNOSIS — J209 Acute bronchitis, unspecified: Secondary | ICD-10-CM | POA: Diagnosis not present

## 2024-03-21 DIAGNOSIS — J440 Chronic obstructive pulmonary disease with acute lower respiratory infection: Secondary | ICD-10-CM

## 2024-03-21 NOTE — Patient Instructions (Addendum)
 VISIT SUMMARY:  We discussed your history of post-COVID lung scarring and emphysema, and reviewed your recent significant improvement in symptoms. Your previous severe cough has improved, and you are currently not experiencing any respiratory distress. We also reviewed your past treatments and current medications.  YOUR PLAN:  -EMPHYSEMA: Emphysema is a lung condition that causes shortness of breath due to damaged air sacs. You have been managing it with Spiriva , which you should continue taking twice daily as instructed.  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 Your condition is stable, and you should follow up with your primary pulmonologist in six months to determine if you need another CT scan.  -BRONCHITIS: Bronchitis is an inflammation of the bronchial tubes, often causing cough and mucus production. Your recent episode has improved significantly with treatment. Use Tessalon  as needed for any residual cough.  INSTRUCTIONS:  Please follow up with Dr. Bertrum Brodie, your primary pulmonologist in six months to assess the need for a CT scan.

## 2024-03-21 NOTE — Progress Notes (Signed)
 Matthew [name]    [id number]    1966-02-17  Primary Care Physician:Johnson, Rexine Cater, MD  Referring Physician: Lawrance Presume, MD [address] [place] [address] [place],  [place] [address]  Chief complaint: Acute visit for bronchitis  HPI: 58 y.o. who  has a past medical history of Coronary artery disease (per pt last cardiology visit 2011 (found documentation 12-05-2009 w/ dr Dortha Gauss with [facility] in epic)/  currently pt is followed by pcp), GERD (gastroesophageal reflux disease), Hyperlipidemia, Olecranon bursitis of left elbow, Respiratory failure (HCC), S/P right coronary artery (RCA) stent placement (12/28/2008), and Wears dentures.  Discussed the use of AI scribe software for clinical note transcription with the patient, who gave verbal consent to proceed.  History of Present Illness Matthew [name] is a 58 year old male with post-COVID lung scarring and emphysema who presents for an annual follow-up visit.  He sees Dr. Bertrum Brodie  He has experienced significant improvement in his condition compared to a month ago. Previously, he had a severe cough that persisted for a month, initially treated with amoxicillin , Sudafed, and prednisone  at an urgent care. Subsequently, he received a Z-Pak and additional prednisone . His cough has improved significantly in the past few days.  He has a history of COVID-19, which required hospitalization for 52 days from September 26, 2020, to November 16, 2020. During this time, he developed post-COVID lung scarring, with scar tissue in his left lung about the size of a stamp. He also has a history of emphysema and received a pneumonia vaccine prior to discharge from the hospital.  He is currently using Spiriva  twice daily for his emphysema. A month ago, he had difficulty sleeping due to his cough and lung issues, often needing to lie on his stomach. He describes a sensation of 'gurgling' in his lungs when lying down, which has since  improved.  His last CT scan was in December 2024, and he is unsure if he is due for another scan this year. His lung condition has been stable over the past few years, with no significant changes noted in previous scans.  No current cough or respiratory distress. Reports significant improvement in symptoms over the past few days.    Outpatient Encounter Medications as of 03/21/2024  Medication Sig   albuterol  (VENTOLIN  HFA) 108 (90 Base) MCG/ACT inhaler INHALE 2 PUFFS INTO THE LUNGS EVERY SIX HOURS AS NEEDED FOR WHEEZING OR SHORTNESS OF BREATH.   Aspirin  81 MG CAPS Aspirin  81 mg   azithromycin  (ZITHROMAX ) 250 MG tablet 2 today, then 1 daily until done   benzonatate  (TESSALON ) 200 MG capsule Take 1 capsule (200 mg total) by mouth 3 (three) times daily as needed for cough.   brompheniramine-pseudoephedrine-DM 30-2-10 MG/5ML syrup Take 10 mLs by mouth every 6 (six) hours as needed (as needed for cough and congestion).   COMIRNATY  syringe Inject 0.3 mLs into the muscle once.   vitamin B-12 (CYANOCOBALAMIN) 1000 MCG tablet Take 5,000 mcg by mouth daily.   Vitamin D, Cholecalciferol, 10 MCG (400 UNIT) CAPS Take 400 Units by mouth daily.   vitamin E 1000 UNIT capsule Take 1,000 Units by mouth daily.   amoxicillin -clavulanate (AUGMENTIN ) 875-125 MG tablet Take 1 tablet by mouth every 12 (twelve) hours. (Patient not taking: Reported on 03/21/2024)   atorvastatin  (LIPITOR) 40 MG tablet Take 1 tablet (40 mg total) by mouth daily. Please schedule PCP visit with Dr. Lincoln Renshaw for additional refills. (Patient not taking: Reported on  03/21/2024)   benzonatate  (TESSALON ) 100 MG capsule Take 1 capsule (100 mg total) by mouth 3 (three) times daily as needed for cough. (Patient not taking: Reported on 03/21/2024)   escitalopram  (LEXAPRO ) 20 MG tablet Take 1 tablet (20 mg total) by mouth daily. (Patient not taking: Reported on 03/21/2024)   predniSONE  (DELTASONE ) 10 MG tablet 4 x 1 day, 3 x 1 day, 2 x 1 day, 1 x 1 day,  1/2 x 1 day and then stop (Patient not taking: Reported on 03/21/2024)   predniSONE  (STERAPRED UNI-PAK 21 TAB) 10 MG (21) TBPK tablet Take by mouth daily. Take 6 tabs by mouth daily  for 2 days, then 5 tabs for 2 days, then 4 tabs for 2 days, then 3 tabs for 2 days, 2 tabs for 2 days, then 1 tab by mouth daily for 2 days (Patient not taking: Reported on 03/21/2024)   SPIRIVA  RESPIMAT 2.5 MCG/ACT AERS INHALE 2 PUFFS DAILY (Patient not taking: Reported on 03/21/2024)   No facility-administered encounter medications on file as of 03/21/2024.   Physical Exam: There were no vitals taken for this visit. Gen:      No acute distress HEENT:  EOMI, sclera anicteric Neck:     No masses; no thyromegaly Lungs:    Clear to auscultation bilaterally; normal respiratory effort CV:         Regular rate and rhythm; no murmurs Abd:      + bowel sounds; soft, non-tender; no palpable masses, no distension Ext:    No edema; adequate peripheral perfusion Skin:      Warm and dry; no rash Neuro: alert and oriented x 3 Psych: normal mood and affect  Data Reviewed: Imaging: CT chest 09/18/2023-stable 9 mm pulmonary nodule, groundglass attenuation, traction bronchiectasis bilaterally, emphysema, right kidney lesion I have reviewed the images personally.  PFTs: 12/03/2022 FVC 3.55 [70+], FEV1 1.71 [45%], F/F48, TLC 4.68 [65%], DLCO 16.86 [58%] Mild restriction, moderate diffusion defect  Labs:  Assessment & Plan Emphysema Chronic emphysema managed with Spiriva . He reports significant improvement in symptoms over the past month, using Spiriva  twice daily as instructed. - Continue Spiriva  twice daily.  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 Last CT scan was in December 2024, typically monitored annually. Follow-up with Dr. Bertrum Brodie, primary pulmonologist in six months to  determine the need for a follow-up CT scan. - Follow up with primary pulmonologist in six months to assess need for CT scan.  Bronchitis Recent episode treated with amoxicillin , Sudafed, prednisone , and Z-Pak. Symptoms have significantly improved, and he reports feeling much better. He carries Tessalon  for any residual cough. - Use Tessalon  as needed for cough.  Recommendations: Follow-up with Dr. Bertrum Brodie in 6 months  Phyllis Breeze MD [facility] 03/21/2024, 1:30 PM  CC: Lawrance Presume, MD

## 2024-03-30 ENCOUNTER — Telehealth: Payer: Self-pay

## 2024-03-30 ENCOUNTER — Ambulatory Visit: Payer: Self-pay | Admitting: Internal Medicine

## 2024-03-30 ENCOUNTER — Ambulatory Visit: Payer: Self-pay

## 2024-03-30 NOTE — Telephone Encounter (Signed)
 Copied from CRM [id number]. Topic: General - Other >> Mar 30, 2024 12:53 PM Margarette Shawl wrote: Reason for CRM:   Pt contacted clinic earlier today and spoke with NT, due to wife testing positive for COVID and him having history ICU stay due to life threatening COVID infection. He reports that he did get tested today and was negative. Reviewed note from provider concerning pt's concerns and advised to contact office if he tested positive, and provider would consider Paxlovid.   Pt will test again Friday and Saturday to confirm negative status. No further questions   Patient verbalized understanding. NFN

## 2024-03-30 NOTE — Telephone Encounter (Signed)
 FYI Only or Action Required?: Action required by provider: requesting recommendations with his wife having tested positive for COVID.  Patient is followed in Pulmonology for post COVID lung scarring and emphysema, last seen on 03/21/2024 by Mannam, Praveen, MD. Called Nurse Triage reporting Spouse tested positive for COVID. Symptoms began today. Interventions attempted: Nothing. Symptoms are: unchanged.  Triage Disposition: Call PCP Within 24 Hours  Patient/caregiver understands and will follow disposition?: No, wishes to speak with PCP-patient would like a call back from pulmonary staff.   Copied from CRM [id number]. Topic: Clinical - Pink Word Triage >> Mar 30, 2024  9:37 AM Isabell A wrote: Reason for Triage: Patient states his wife tested positive for Covid and he has already had Covid pneumonia but would like to speak with a nurse.    Callback number: [phone] >> Mar 30, 2024  9:40 AM Isabell A wrote: Patient is feeling fine but just nervous.  >> Mar 30, 2024  9:38 AM Isabell A wrote: Reason for Triage: Patient states his wife tested positive for Covid and he has already had Covid pneumonia but would like to speak with a nurse.    Callback number: [phone] Reason for Disposition  [1] COVID-19 EXPOSURE within last 14 days AND [2] weak immune system (e.g., HIV positive, cancer chemo, splenectomy, organ transplant, chronic steroids) AND [3] NO symptoms  Answer Assessment - Initial Assessment Questions 1. COVID-19 EXPOSURE: Please describe how you were exposed to someone with a COVID-19 infection.     Wife tested positive for COVID- 2. PLACE of CONTACT: Where were you when you were exposed to COVID-19? (e.g., home, school, medical waiting room; which city?)     home 3. TYPE of CONTACT: How much contact was there? (e.g., sitting next to, live in same house, work in same office, same building)     Lives in same house 4. DURATION of CONTACT: How long were you in contact with  the COVID-19 patient? (e.g., a few seconds, passed by person, a few minutes, 15 minutes or longer, live with the patient)     Lives with patient 5. MASK: Were you wearing a mask? Was the other person wearing a mask? Note: wearing a mask reduces the risk of an otherwise close contact.     No- 6. DATE of CONTACT: When did you have contact with a COVID-19 patient? (e.g., how many days ago)     Yes-wife tested positive  7. COMMUNITY SPREAD: Do you live in or have you traveled to an area where there are lots of COVID-19 cases (community spread)? (See public health department website, if unsure)       unsure 8. SYMPTOMS: Do you have any symptoms? (e.g., fever, cough, breathing difficulty, loss of taste or smell)     No symptoms currently.  9. VACCINE: Have you gotten the COVID-19 vaccine? If Yes, ask: Which one, how many shots, when did you get it?     yes 10. PREGNANCY OR POSTPARTUM: Is there any chance you are pregnant? When was your last menstrual period? Did you deliver in the last 2 weeks?       no 11. HIGH RISK: Do you have any heart or lung problems? (e.g., asthma, COPD, heart failure) Do you have a weak immune system or other risk factors? (e.g., HIV positive, chemotherapy, renal failure, diabetes mellitus, sickle cell anemia, obesity)       Yes-hx of COVID-weakened immune system.  Patient with complicated hx due to COVID. Patient's wife tested positive for COVID  today. Patient reports no new symptoms for himself but he has been dealing with bronchitis of the last month. Denies any new symptoms today. Patient was seen on 03/21/2024 with pulmonary for annual visit. Patient instructed to stop and buy a home COVID test for himself. Patient states he is going to stay in a hotel with his wife having tested positive. Endorses that his wife was started on Paxlovid. Questioning if he should be placed on Paxlovid. Patient is very nervous and asking for a phone call back with  recommendations from pulmonary staff.  Protocols used: Coronavirus (COVID-19) Exposure-A-AH

## 2024-03-30 NOTE — Telephone Encounter (Signed)
 Matthew [name] \\Was  admitted in the hospital few years ago with life-threatening COVID and was in the ICU for many weeks.  Naturally is going to be very scared because his wife tested positive for COVID.  Plan - Please reassure him.  Please let him know that given the prior vaccines and prior life-threatening COVID he likely has natural immunity that will prevent ICU stay - Monitor COVID antigen test daily for the next 1 week -If he gets COVID then we can consider giving him Paxlovid

## 2024-03-30 NOTE — Telephone Encounter (Signed)
 FYI Only or Action Required?: Action required by provider: clinical question for provider.- Patient would like to know if he should be started on Paxlovid.  Patient was last seen in primary care on 07/29/2023 by Matthew Presume, MD. Called Nurse Triage reporting Covid Exposure. Exposure began yesterday. Interventions attempted: Nothing. Symptoms are: asymptomatic, COVID exposure from his wife stable.  Triage Disposition: Call PCP Within 24 Hours  Patient/caregiver understands and will follow disposition?: Yes                 Reason for Triage: Patient states his wife tested positive for Covid and he has already had Covid pneumonia but would like to speak with a nurse.   Callback number: [phone]   Reason for Disposition  [1] COVID-19 EXPOSURE within last 14 days AND [2] weak immune system (e.g., HIV positive, cancer chemo, splenectomy, organ transplant, chronic steroids) AND [3] NO symptoms  Answer Assessment - Initial Assessment Questions Patient spoke with pulmonary RN for triage. Patient states his wife is COVID positive and on Paxlovid. Patient states he would prefer for his pulmonologist to treat him rather than Dr Lincoln Renshaw. He is asking if he should be put on Paxlovid. Patient states he will pick up a home COVID test, and plans to pick up clean clothes from his house and staying in a hotel.    1. COVID-19 EXPOSURE: Please describe how you were exposed to someone with a COVID-19 infection.     His wife is COVID positive but he states he has not been around very much, due to during the day he is in [place] working 10-14 hour work days.  2. PLACE of CONTACT: Where were you when you were exposed to COVID-19? (e.g., home, school, medical waiting room; which city?)     Home.  3. TYPE of CONTACT: How much contact was there? (e.g., sitting next to, live in same house, work in same office, same building)     Live in the same house.  4. DURATION of CONTACT: How  long were you in contact with the COVID-19 patient? (e.g., a few seconds, passed by person, a few minutes, 15 minutes or longer, live with the patient)     Lives with sick patient.  5. MASK: Were you wearing a mask? Was the other person wearing a mask? Note: wearing a mask reduces the risk of an otherwise close contact.     No.  6. DATE of CONTACT: When did you have contact with a COVID-19 patient? (e.g., how many days ago)     His wife tested positive for COVID this morning, she started having symptoms yesterday morning.  7. COMMUNITY SPREAD: Do you live in or have you traveled to an area where there are lots of COVID-19 cases (community spread)? (See public health department website, if unsure)       Yes currently spreading in community.  8. SYMPTOMS: Do you have any symptoms? (e.g., fever, cough, breathing difficulty, loss of taste or smell)     No.  9. VACCINE: Have you gotten the COVID-19 vaccine? If Yes, ask: Which one, how many shots, when did you get it?     Yes, 2024 Pfizer booster.  10. PREGNANCY OR POSTPARTUM: Is there any chance you are pregnant? When was your last menstrual period? Did you deliver in the last 2 weeks?       N/A.  11. HIGH RISK: Do you have any heart or lung problems? (e.g., asthma, COPD, heart failure) Do you have  a weak immune system or other risk factors? (e.g., HIV positive, chemotherapy, renal failure, diabetes mellitus, sickle cell anemia, obesity)       Weakened immune system- hospitalized in the past with COVID pneumonia.  Protocols used: Coronavirus (COVID-19) Exposure-A-AH

## 2024-03-30 NOTE — Telephone Encounter (Signed)
 Patient called back to check status of message sent through MyChart. Patient verified that he could get a test at any pharmacy. Patient states he will call back with results.

## 2024-03-30 NOTE — Telephone Encounter (Signed)
 He should do COVID test and if positive let us  know so that we can prescribe Paxlovid if he would like to take it.  Paxlovid does cause a change in taste like a metallic taste.  It also causes diarrhea.

## 2024-03-31 NOTE — Telephone Encounter (Signed)
 Call to patient unable to reach message left.

## 2024-04-02 ENCOUNTER — Encounter: Payer: Self-pay | Admitting: Internal Medicine

## 2024-04-02 ENCOUNTER — Telehealth: Payer: Self-pay | Admitting: Internal Medicine

## 2024-04-02 ENCOUNTER — Telehealth: Payer: Self-pay | Admitting: Pulmonary Disease

## 2024-04-02 MED ORDER — PAXLOVID (300/100) 20 X 150 MG & 10 X 100MG PO TBPK: 3.0000 | ORAL_TABLET | Freq: Two times a day (BID) | ORAL | 0 refills | Status: DC

## 2024-04-02 MED ORDER — PAXLOVID (300/100) 20 X 150 MG & 10 X 100MG PO TBPK: 3.0000 | ORAL_TABLET | Freq: Two times a day (BID) | ORAL | 0 refills | Status: AC

## 2024-04-02 NOTE — Telephone Encounter (Signed)
 Tested COVID + WIth his past history, would like paxlovid Sent in to pharmacy @ [facility]

## 2024-04-02 NOTE — Telephone Encounter (Unsigned)
 Dr Jude earlier called in prescription for Paxlovid . Patient called saying that pharmacy is closed and want me to call [facility] pharmacy.  Called pharmacist and given verbal Paxlovid order. Notified back patient same to collect from [facility].

## 2024-04-03 ENCOUNTER — Ambulatory Visit: Payer: Self-pay

## 2024-04-03 NOTE — Telephone Encounter (Signed)
**Note De-identified  [place] Obfuscation** Please advise 

## 2024-04-03 NOTE — Telephone Encounter (Signed)
 FYI Only or Action Required?: Action required by provider: clinical question for provider.  Patient is followed in Pulmonology for COPD, last seen on 03/21/2024 by Mannam, Praveen, MD. Called Nurse Triage reporting Medication Consultation.   Triage Disposition: Call Specialist Now  Patient/caregiver understands and will follow disposition?: Yes     Copied from CRM [id number]. Topic: Clinical - Red Word Triage >> Apr 03, 2024 10:29 AM Matthew [name] wrote: Red Word that prompted transfer to Nurse Triage: Pt was called in nirmatrelvir/ritonavir (PAXLOVID, 300/100,) 20 x 150 MG & 10 x 100MG  TBPK due to having COVID. Pt wants to speak to nurse to verify if he can take Clariton D and benzonatate  (TESSALON ) 200 MG capsule 200 mg and take the nirmatrelvir/ritonavir (PAXLOVID, 300/100,) 20 x 150 MG & 10 x 100MG  TBPK. Reason for Disposition  [1] Caller has URGENT medicine question about med that PCP or specialist prescribed AND [2] triager unable to answer question  Answer Assessment - Initial Assessment Questions 1. NAME of MEDICINE: What medicine(s) are you calling about?     Paxlovid Claritin D Tessalon  2. QUESTION: What is your question? (e.g., double dose of medicine, side effect)     Med interactions 3. PRESCRIBER: Who prescribed the medicine? Reason: if prescribed by specialist, call should be referred to that group.     Dr. Dante 4. SYMPTOMS: Do you have any symptoms? If Yes, ask: What symptoms are you having?  How bad are the symptoms (e.g., mild, moderate, severe)     COVID sx only 5. PREGNANCY:  Is there any chance that you are pregnant? When was your last menstrual period?     N/a  Protocols used: Medication Question Call-A-AH

## 2024-04-03 NOTE — Telephone Encounter (Signed)
 Patient calling back to ask for an update if he is able to take Benzonatate  along with Paxlovid. Patient is asking for a phone call from staff.

## 2024-04-03 NOTE — Telephone Encounter (Signed)
 Yes it should be fine, did not see an interaction when I looked it up.  Please contact office for sooner follow up if symptoms do not improve or worsen or seek emergency care

## 2024-04-04 NOTE — Telephone Encounter (Signed)
 Spoke with patient regarding prior message .Patient stated he is feeling much better. Patient's voice understanding nothing else further needed.

## 2024-04-11 DIAGNOSIS — Z125 Encounter for screening for malignant neoplasm of prostate: Secondary | ICD-10-CM | POA: Diagnosis not present

## 2024-04-11 DIAGNOSIS — D4101 Neoplasm of uncertain behavior of right kidney: Secondary | ICD-10-CM | POA: Diagnosis not present

## 2024-04-11 DIAGNOSIS — R310 Gross hematuria: Secondary | ICD-10-CM | POA: Diagnosis not present

## 2024-04-13 ENCOUNTER — Ambulatory Visit
Admission: RE | Admit: 2024-04-13 | Discharge: 2024-04-13 | Disposition: A | Discharge status: Home / Self Care | Source: Ambulatory Visit | Attending: Internal Medicine | Admitting: Internal Medicine

## 2024-04-13 ENCOUNTER — Other Ambulatory Visit

## 2024-04-13 DIAGNOSIS — R911 Solitary pulmonary nodule: Secondary | ICD-10-CM

## 2024-04-13 DIAGNOSIS — J439 Emphysema, unspecified: Secondary | ICD-10-CM

## 2024-04-13 DIAGNOSIS — R918 Other nonspecific abnormal finding of lung field: Secondary | ICD-10-CM | POA: Diagnosis not present

## 2024-04-13 DIAGNOSIS — Z87891 Personal history of nicotine dependence: Secondary | ICD-10-CM

## 2024-04-13 DIAGNOSIS — J8410 Pulmonary fibrosis, unspecified: Secondary | ICD-10-CM

## 2024-04-17 ENCOUNTER — Other Ambulatory Visit: Payer: Self-pay | Admitting: Urology

## 2024-04-18 ENCOUNTER — Other Ambulatory Visit: Payer: Self-pay | Admitting: Urology

## 2024-04-18 ENCOUNTER — Telehealth: Payer: Self-pay | Admitting: Cardiovascular Disease

## 2024-04-18 NOTE — Telephone Encounter (Signed)
   Pre-operative Risk Assessment    Patient Name: Matthew [name]  DOB: 01/31/1966 MRN: [id number]   Date of last office visit: 03/17/23 Date of next office visit: Not yet scheduled   Request for Surgical Clearance    Procedure:  Robotic partial Nephrectomy  Date of Surgery:  Clearance 06/28/24                                Surgeon:  Dr. Ricardo Likens Surgeon's Group or Practice Name:  [facility]  Phone number:  [phone]  Fax number:  [phone]   Type of Clearance Requested:   - Medical  - Pharmacy:  Hold Aspirin      Type of Anesthesia:  General    Additional requests/questions:  Caller [name]) noted patient is on Aspirin  81 mg.    Signed, Jasmin B Wilson   04/18/2024, 2:46 PM

## 2024-04-19 NOTE — Telephone Encounter (Signed)
   Name: Matthew [name]  DOB: 1966-08-12  MRN: [id number]  Primary Cardiologist: Dr. Court   Chart reviewed as part of pre-operative protocol coverage. Because of Matthew [name]'s past medical history and time since last visit, he will require a follow-up in-office visit in order to better assess preoperative cardiovascular risk. Last seen by Dr. Court on 03/17/2023  Pre-op covering staff: - Please schedule appointment and call patient to inform them. If patient already had an upcoming appointment within acceptable timeframe, please add pre-op clearance to the appointment notes so provider is aware. - Please contact requesting surgeon's office via preferred method (i.e, phone, fax) to inform them of need for appointment prior to surgery.    [name], NP  04/19/2024, 8:52 AM

## 2024-04-19 NOTE — Telephone Encounter (Signed)
 Pt has been scheduled to see Dr. Court 05/15/24 for preop clearance. I will update all parties involved of appt.

## 2024-05-15 ENCOUNTER — Encounter: Payer: Self-pay | Admitting: Cardiovascular Disease

## 2024-05-15 ENCOUNTER — Ambulatory Visit: Attending: Cardiovascular Disease | Admitting: Cardiovascular Disease

## 2024-05-15 VITALS — BP 122/82 | HR 93 | Ht 71.0 in | Wt 255.2 lb

## 2024-05-15 DIAGNOSIS — I2510 Atherosclerotic heart disease of native coronary artery without angina pectoris: Secondary | ICD-10-CM

## 2024-05-15 DIAGNOSIS — Z720 Tobacco use: Secondary | ICD-10-CM

## 2024-05-15 DIAGNOSIS — E785 Hyperlipidemia, unspecified: Secondary | ICD-10-CM | POA: Diagnosis not present

## 2024-05-15 DIAGNOSIS — I10 Essential (primary) hypertension: Secondary | ICD-10-CM

## 2024-05-15 NOTE — Progress Notes (Signed)
 05/15/2024 Matthew [name]   1965/12/25  [id number]  Primary Physician Pcp, No Primary Cardiologist: Dorn JINNY Lesches MD [place] [place], [place]  HPI:  Matthew [name] is a 58 y.o.  moderately overweight married Caucasian male with no children who was referred by Dr. Geronimo, his pulmonologist, because of coronary calcification seen on chest CT incidentally.  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  His cardiac risk factors are notable for 80-pack-year tobacco abuse having smoked 2 packs a day and stopped when he got in the hospital on 11/16/2020.  He is back to smoking 1/2 pack/day.  I last saw him in the office 03/17/2023.  He does have untreated hyperlipidemia.  There is no family history of heart disease.  He does have a history of ischemic heart disease status post cardiac catheterization, PCI drug-eluting stenting by myself 12/28/2008 because of accelerated angina.  This was IVUS guided.  His left system was free of disease.  His LV function was normal back then as it was as recently as 09/28/2020 by 2D echo.   He was hospitalized 09/26/2020 through 11/16/2020 with COVID-pneumonia.  He was never intubated.  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  He apparently has pulmonary fibrosis followed by Dr. Geronimo.  He is fairly active doing yard work outside.  Does get some dyspnea but denies chest pain.  He did have a CT scan that showed a 10 cm renal mass which is being followed as well.  Since I saw him a year ago he is remained stable.  He is still smoking 1/2 pack/day.  He has lost 25 pounds as a result of diet.  Apparently he needs a partial nephrectomy done laparoscopically on 06/28/2024.  I believe he is at low risk for this.     Current Meds  Medication Sig   albuterol  (VENTOLIN  HFA) 108 (90 Base) MCG/ACT inhaler INHALE 2  PUFFS INTO THE LUNGS EVERY SIX HOURS AS NEEDED FOR WHEEZING OR SHORTNESS OF BREATH.   Aspirin  81 MG CAPS Aspirin  81 mg   benzonatate  (TESSALON ) 100 MG capsule Take 1 capsule (100 mg total) by mouth 3 (three) times daily as needed for cough.   brompheniramine-pseudoephedrine-DM 30-2-10 MG/5ML syrup Take 10 mLs by mouth every 6 (six) hours as needed (as needed for cough and congestion).   SPIRIVA  RESPIMAT 2.5 MCG/ACT AERS INHALE 2 PUFFS DAILY   vitamin B-12 (CYANOCOBALAMIN) 1000 MCG tablet Take 5,000 mcg by mouth daily.   Vitamin D, Cholecalciferol, 10 MCG (400 UNIT) CAPS Take 400 Units by mouth daily.   vitamin E 1000 UNIT capsule Take 1,000 Units by mouth daily.     Allergies  Allergen Reactions   Codeine Anaphylaxis and Nausea And Vomiting   Onion Anaphylaxis and Swelling    thoart swells up   Bee Venom Hives    Social History   Socioeconomic History   Marital status: Married    Spouse name: Not on file   Number of children: Not on file   Years of education: Not on file   Highest education level: Not on file  Occupational History   Not on file  Tobacco Use   Smoking status: Former    Current packs/day: 0.00    Average packs/day: 2.0 packs/day for 42.1 years (84.2 ttl pk-yrs)    Types: Cigarettes  Start date: 56    Quit date: 11/17/2019    Years since quitting: 4.4   Smokeless tobacco: Never   Tobacco comments:    since age 78  Vaping Use   Vaping status: Never Used  Substance and Sexual Activity   Alcohol use: Yes    Comment: seldom   Drug use: No   Sexual activity: Yes    Birth control/protection: None  Other Topics Concern   Not on file  Social History Narrative   Not on file   Social Drivers of Health   Financial Resource Strain: Not on file  Food Insecurity: Not on file  Transportation Needs: Not on file  Physical Activity: Not on file  Stress: Not on file  Social Connections: Not on file  Intimate Partner Violence: Not on file     Review of  Systems: General: negative for chills, fever, night sweats or weight changes.  Cardiovascular: negative for chest pain, dyspnea on exertion, edema, orthopnea, palpitations, paroxysmal nocturnal dyspnea or shortness of breath Dermatological: negative for rash Respiratory: negative for cough or wheezing Urologic: negative for hematuria Abdominal: negative for nausea, vomiting, diarrhea, bright red blood per rectum, melena, or hematemesis Neurologic: negative for visual changes, syncope, or dizziness All other systems reviewed and are otherwise negative except as noted above.    Blood pressure 122/82, pulse 93, height 5' 11 (1.803 m), weight 255 lb 3.2 oz (115.8 kg), SpO2 (!) 88%.  General appearance: alert and no distress Neck: no adenopathy, no carotid bruit, no JVD, supple, symmetrical, trachea midline, and thyroid not enlarged, symmetric, no tenderness/mass/nodules Lungs: clear to auscultation bilaterally Heart: regular rate and rhythm, S1, S2 normal, no murmur, click, rub or gallop Extremities: extremities normal, atraumatic, no cyanosis or edema Pulses: 2+ and symmetric Skin: Skin color, texture, turgor normal. No rashes or lesions Neurologic: Grossly normal  EKG EKG Interpretation Date/Time:  Monday May 15 2024 15:42:44 EDT Ventricular Rate:  93 PR Interval:  146 QRS Duration:  84 QT Interval:  364 QTC Calculation: 452 R Axis:   1  Text Interpretation: Normal sinus rhythm Normal ECG When compared with ECG of 26-Sep-2020 19:33, PREVIOUS ECG IS PRESENT Confirmed by Court Carrier [phone]) on 05/15/2024 3:58:39 PM    ASSESSMENT AND PLAN:   Tobacco abuse Ongoing tobacco abuse 1/2 pack/day recalcitrant to risk factor modification.  CAD (coronary artery disease) History of CAD status post IVUS guided RCA PCI and stenting with a 3.5 mm x 18 mm long Xience drug-eluting stent 12/28/2008.  Left system was free of significant disease.  He is remained completely  asymptomatic.  Essential hypertension History of essential hypertension with blood pressure measured today 122/82.  He is not on antihypertensive medications.  Hyperlipidemia History of hyperlipidemia on statin therapy with lipid profile performed 03/17/2023 revealing a total cholesterol of 183, LDL of 115 and HDL 30.  He is not a goal for secondary prevention.  He did start his statin since that blood test which he admittedly takes sporadically.  I am going to recheck a lipid liver profile.  LDL goal less than 70.     Carrier DOROTHA Court MD FACP,FACC,FAHA, [facility] 05/15/2024 4:07 PM

## 2024-05-15 NOTE — Assessment & Plan Note (Signed)
 History of essential hypertension with blood pressure measured today 122/82.  He is not on antihypertensive medications.

## 2024-05-15 NOTE — Assessment & Plan Note (Signed)
 History of CAD status post IVUS guided RCA PCI and stenting with a 3.5 mm x 18 mm long Xience drug-eluting stent 12/28/2008.  Left system was free of significant disease.  He is remained completely asymptomatic.

## 2024-05-15 NOTE — Assessment & Plan Note (Signed)
 History of hyperlipidemia on statin therapy with lipid profile performed 03/17/2023 revealing a total cholesterol of 183, LDL of 115 and HDL 30.  He is not a goal for secondary prevention.  He did start his statin since that blood test which he admittedly takes sporadically.  I am going to recheck a lipid liver profile.  LDL goal less than 70.

## 2024-05-15 NOTE — Patient Instructions (Signed)
 Medication Instructions:  Your physician recommends that you continue on your current medications as directed. Please refer to the Current Medication list given to you today.  *If you need a refill on your cardiac medications before your next appointment, please call your pharmacy*   Lab Work: Your physician recommends that you return for lab work in: the next week or 2 for FASTING lipid/liver panel  If you have labs (blood work) drawn today and your tests are completely normal, you will receive your results only by: MyChart Message (if you have MyChart) OR A paper copy in the mail If you have any lab test that is abnormal or we need to change your treatment, we will call you to review the results.   Follow-Up: At [facility], you and your health needs are our priority.  As part of our continuing mission to provide you with exceptional heart care, our providers are all part of one team.  This team includes your primary Cardiologist (physician) and Advanced Practice Providers or APPs (Physician Assistants and Nurse Practitioners) who all work together to provide you with the care you need, when you need it.  Your next appointment:   12 month(s)  Provider:   Lauro Portal, MD    We recommend signing up for the patient portal called MyChart.  Sign up information is provided on this After Visit Summary.  MyChart is used to connect with patients for Virtual Visits (Telemedicine).  Patients are able to view lab/test results, encounter notes, upcoming appointments, etc.  Non-urgent messages can be sent to your provider as well.   To learn more about what you can do with MyChart, go to [email or web address].

## 2024-05-15 NOTE — Assessment & Plan Note (Signed)
Ongoing tobacco abuse 1/2 pack/day recalcitrant to risk factor modification. 

## 2024-05-17 NOTE — Telephone Encounter (Signed)
 I have faxed pre-op clearance office note to requesting provider's office. I will call the office later to see if they have received it.

## 2024-05-17 NOTE — Telephone Encounter (Signed)
 [facility] called in stating they received clearance letter but it does not indicate whether he has been cleared or not. Please advise.

## 2024-05-23 DIAGNOSIS — Z720 Tobacco use: Secondary | ICD-10-CM | POA: Diagnosis not present

## 2024-05-23 DIAGNOSIS — E785 Hyperlipidemia, unspecified: Secondary | ICD-10-CM | POA: Diagnosis not present

## 2024-05-23 DIAGNOSIS — I2510 Atherosclerotic heart disease of native coronary artery without angina pectoris: Secondary | ICD-10-CM | POA: Diagnosis not present

## 2024-05-23 DIAGNOSIS — I10 Essential (primary) hypertension: Secondary | ICD-10-CM | POA: Diagnosis not present

## 2024-05-24 ENCOUNTER — Ambulatory Visit: Payer: Self-pay | Admitting: Cardiovascular Disease

## 2024-05-24 LAB — HEPATIC FUNCTION PANEL
ALT: 17 IU/L (ref 0–44)
AST: 19 IU/L (ref 0–40)
Albumin: 4.2 g/dL (ref 3.8–4.9)
Alkaline Phosphatase: 132 IU/L — ABNORMAL HIGH (ref 44–121)
Bilirubin Total: 0.5 mg/dL (ref 0.0–1.2)
Bilirubin, Direct: 0.16 mg/dL (ref 0.00–0.40)
Total Protein: 7.8 g/dL (ref 6.0–8.5)

## 2024-05-24 LAB — LIPID PANEL
Chol/HDL Ratio: 6 ratio — ABNORMAL HIGH (ref 0.0–5.0)
Cholesterol, Total: 167 mg/dL (ref 100–199)
HDL: 28 mg/dL — ABNORMAL LOW (ref >39)
LDL Chol Calc (NIH): 108 mg/dL — ABNORMAL HIGH (ref 0–99)
Triglycerides: 176 mg/dL — ABNORMAL HIGH (ref 0–149)
VLDL Cholesterol Cal: 31 mg/dL (ref 5–40)

## 2024-06-05 ENCOUNTER — Telehealth: Payer: Self-pay

## 2024-06-05 DIAGNOSIS — R058 Other specified cough: Secondary | ICD-10-CM

## 2024-06-05 DIAGNOSIS — R0989 Other specified symptoms and signs involving the circulatory and respiratory systems: Secondary | ICD-10-CM

## 2024-06-05 NOTE — Telephone Encounter (Signed)
 Copied from CRM #[id number]. Topic: Clinical - Medical Advice >> Jun 05, 2024 11:43 AM Celestine FALCON wrote: Reason for CRM: Pt stated he would by Dr. Geronimo to call to speak to his nurse regarding congestion.  Pt stated his cough is subsiding, but he still has congestion that he needs to get rid of before middle of September as he is scheduled to have surgery.   Pt stated he would prefer to speak to a nurse about assisting with his congestion; phone number [phone] ok to leave a vm.  Spoke with patient regarding prior message. Patient stated he has been having some congestion,slight cough,sore throat and hoarseness of voice and 0 fever patient would like something sent into his pharmacy.Patient stated he is having surgery on 06/28/24 and also have asked for  Xanax  20mg  2 times a day. Patient stated he has not seen his PCP because she has not helped him. Patient will need to find a PCP.  Dr.Ramaswamy can you please advise.

## 2024-06-05 NOTE — Telephone Encounter (Signed)
    If he needs Xanax  for the surgery then it has to be from the surgeon.  If he needs Xanax  or something else it has to be with from primary care physician    Regarding cough and congestion  Please send him - Take prednisone  40 mg daily x 2 days, then 20mg  daily x 2 days, then 10mg  daily x 2 days, then 5mg  daily x 2 days and stop  - Z-Pak   Allergies  Allergen Reactions   Codeine Anaphylaxis and Nausea And Vomiting   Onion Anaphylaxis and Swelling    thoart swells up   Bee Venom Hives

## 2024-06-06 MED ORDER — PREDNISONE 10 MG PO TABS: ORAL_TABLET | ORAL | 0 refills | Status: DC

## 2024-06-06 MED ORDER — AZITHROMYCIN 250 MG PO TABS: 250.0000 mg | ORAL_TABLET | Freq: Every day | ORAL | 0 refills | Status: DC

## 2024-06-06 NOTE — Telephone Encounter (Signed)
 Spoke with patient regarding prior message . Advised patient per [facility]   If he needs Xanax  for the surgery then it has to be from the surgeon.  If he needs Xanax  or something else it has to be with from primary care physician       Regarding cough and congestion   Please send him - Take prednisone  40 mg daily x 2 days, then 20mg  daily x 2 days, then 10mg  daily x 2 days, then 5mg  daily x 2 days and stop   - Z-Pak  Advised patient I have sent in the script's to pharmacy of choice . Patient's voice was understanding.Nothing else further needed.

## 2024-06-13 ENCOUNTER — Ambulatory Visit (INDEPENDENT_AMBULATORY_CARE_PROVIDER_SITE_OTHER): Admitting: Internal Medicine

## 2024-06-13 ENCOUNTER — Encounter: Payer: Self-pay | Admitting: Internal Medicine

## 2024-06-13 VITALS — BP 132/84 | HR 93 | Ht 71.0 in | Wt 249.0 lb

## 2024-06-13 DIAGNOSIS — R0609 Other forms of dyspnea: Secondary | ICD-10-CM | POA: Diagnosis not present

## 2024-06-13 DIAGNOSIS — Z122 Encounter for screening for malignant neoplasm of respiratory organs: Secondary | ICD-10-CM | POA: Diagnosis not present

## 2024-06-13 DIAGNOSIS — Z01811 Encounter for preprocedural respiratory examination: Secondary | ICD-10-CM

## 2024-06-13 DIAGNOSIS — U099 Post covid-19 condition, unspecified: Secondary | ICD-10-CM | POA: Diagnosis not present

## 2024-06-13 DIAGNOSIS — Z87891 Personal history of nicotine dependence: Secondary | ICD-10-CM

## 2024-06-13 NOTE — Patient Instructions (Addendum)
 ICD-10-CM   1. Preop respiratory exam  Z01.811     2. COVID-19 long hauler manifesting chronic dyspnea  R06.09    U09.9     3. 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) Pulmonary emphysema, unspecified emphysema type (HCC)  =currently stable  Plan  - cotninue spiriva  scheduled - encourage you talk to PCP regrding weight loss drugs  - flu shot recommeded (deferred at his wishes 06/13/2024)   Left upper lobe Lung nodule 1cm Feb 2024 (new since feb 2023 - -> 0.7mm June 2024) -> stable Nov 2024 PRior history smoking  - no nodule July 2025  Plan  -refer lung cancer screening program   Right kidney mass 10cm feb 2024  - reassured by Urology and stable on CT chest image Nov 2024 But Sept 2025 needing partial nephrectomy  Pla  -LOW RISK for prolonged ventilator dependence - Modete risk for pneumonia, atelectasis etc.    Follow-up -6 months with appp

## 2024-06-13 NOTE — Patient Instructions (Signed)
 SURGICAL WAITING ROOM VISITATION  Patients having surgery or a procedure may have no more than 2 support people in the waiting area - these visitors may rotate.    Children under the age of 48 must have an adult with them who is not the patient.  Visitors with respiratory illnesses are discouraged from visiting and should remain at home.  If the patient needs to stay at the hospital during part of their recovery, the visitor guidelines for inpatient rooms apply. Pre-op nurse will coordinate an appropriate time for 1 support person to accompany patient in pre-op.  This support person may not rotate.    Please refer to the [facility] website for the visitor guidelines for Inpatients (after your surgery is over and you are in a regular room).       Your procedure is scheduled on: 06/28/24   Report to [facility] Main Entrance    Report to admitting at 9:15 AM   Call this number if you have problems the morning of surgery [phone]   Do not eat food or drink liquids:After Midnight. But may have sips of water to take meds.     Oral Hygiene is also important to reduce your risk of infection.                                    Remember - BRUSH YOUR TEETH THE MORNING OF SURGERY WITH YOUR REGULAR TOOTHPASTE  DENTURES WILL BE REMOVED PRIOR TO SURGERY PLEASE DO NOT APPLY Poly grip OR ADHESIVES!!!   Do NOT smoke after Midnight   Stop all vitamins and herbal supplements 7 days before surgery.   Take these medicines the morning of surgery with A SIP OF WATER: None. May use inhalers. Bring them to hospital             You may not have any metal on your body including hair pins, jewelry, and body piercing             Do not wear make-up, lotions, powders, perfumes/cologne, or deodorant              Men may shave face and neck.   Do not bring valuables to the hospital. [facility] IS NOT             RESPONSIBLE   FOR VALUABLES.   Contacts, glasses, dentures or bridgework  may not be worn into surgery.   Bring small overnight bag day of surgery.   DO NOT BRING YOUR HOME MEDICATIONS TO THE HOSPITAL. PHARMACY WILL DISPENSE MEDICATIONS LISTED ON YOUR MEDICATION LIST TO YOU DURING YOUR ADMISSION IN THE HOSPITAL!    Patients discharged on the day of surgery will not be allowed to drive home.  Someone NEEDS to stay with you for the first 24 hours after anesthesia.   Special Instructions: Bring a copy of your healthcare power of attorney and living will documents the day of surgery if you haven't scanned them before.              Please read over the following fact sheets you were given: IF YOU HAVE QUESTIONS ABOUT YOUR PRE-OP INSTRUCTIONS PLEASE CALL [phone] [place]   If you received a COVID test during your pre-op visit  it is requested that you wear a mask when out in public, stay away from anyone that may not be feeling well and notify your surgeon if you  develop symptoms. If you test positive for Covid or have been in contact with anyone that has tested positive in the last 10 days please notify you surgeon.    [facility] - Preparing for Surgery Before surgery, you can play an important role.  Because skin is not sterile, your skin needs to be as free of germs as possible.  You can reduce the number of germs on your skin by washing with CHG (chlorahexidine gluconate) soap before surgery.  CHG is an antiseptic cleaner which kills germs and bonds with the skin to continue killing germs even after washing. Please DO NOT use if you have an allergy to CHG or antibacterial soaps.  If your skin becomes reddened/irritated stop using the CHG and inform your nurse when you arrive at Short Stay. Do not shave (including legs and underarms) for at least 48 hours prior to the first CHG shower.  You may shave your face/neck.  Please follow these instructions carefully:  1.  Shower with CHG Soap the night before surgery and the  morning of surgery.  2.  If you choose to wash  your hair, wash your hair first as usual with your normal  shampoo.  3.  After you shampoo, rinse your hair and body thoroughly to remove the shampoo.                             4.  Use CHG as you would any other liquid soap.  You can apply chg directly to the skin and wash.  Gently with a scrungie or clean washcloth.  5.  Apply the CHG Soap to your body ONLY FROM THE NECK DOWN.   Do   not use on face/ open                           Wound or open sores. Avoid contact with eyes, ears mouth and   genitals (private parts).                       Wash face,  Genitals (private parts) with your normal soap.             6.  Wash thoroughly, paying special attention to the area where your    surgery  will be performed.  7.  Thoroughly rinse your body with warm water from the neck down.  8.  DO NOT shower/wash with your normal soap after using and rinsing off the CHG Soap.                9.  Pat yourself dry with a clean towel.            10.  Wear clean pajamas.            11.  Place clean sheets on your bed the night of your first shower and do not  sleep with pets. Day of Surgery : Do not apply any lotions/deodorants the morning of surgery.  Please wear clean clothes to the hospital/surgery center.  FAILURE TO FOLLOW THESE INSTRUCTIONS MAY RESULT IN THE CANCELLATION OF YOUR SURGERY  ________________________________________________________________________ WHAT IS A BLOOD TRANSFUSION? Blood Transfusion Information  A transfusion is the replacement of blood or some of its parts. Blood is made up of multiple cells which provide different functions. Red blood cells carry oxygen and are used for blood loss replacement. White blood cells  fight against infection. Platelets control bleeding. Plasma helps clot blood. Other blood products are available for specialized needs, such as hemophilia or other clotting disorders. BEFORE THE TRANSFUSION  Who gives blood for transfusions?  Healthy volunteers who  are fully evaluated to make sure their blood is safe. This is blood bank blood. Transfusion therapy is the safest it has ever been in the practice of medicine. Before blood is taken from a donor, a complete history is taken to make sure that person has no history of diseases nor engages in risky social behavior (examples are intravenous drug use or sexual activity with multiple partners). The donor's travel history is screened to minimize risk of transmitting infections, such as malaria. The donated blood is tested for signs of infectious diseases, such as HIV and hepatitis. The blood is then tested to be sure it is compatible with you in order to minimize the chance of a transfusion reaction. If you or a relative donates blood, this is often done in anticipation of surgery and is not appropriate for emergency situations. It takes many days to process the donated blood. RISKS AND COMPLICATIONS Although transfusion therapy is very safe and saves many lives, the main dangers of transfusion include:  Getting an infectious disease. Developing a transfusion reaction. This is an allergic reaction to something in the blood you were given. Every precaution is taken to prevent this. The decision to have a blood transfusion has been considered carefully by your caregiver before blood is given. Blood is not given unless the benefits outweigh the risks. AFTER THE TRANSFUSION Right after receiving a blood transfusion, you will usually feel much better and more energetic. This is especially true if your red blood cells have gotten low (anemic). The transfusion raises the level of the red blood cells which carry oxygen, and this usually causes an energy increase. The nurse administering the transfusion will monitor you carefully for complications. HOME CARE INSTRUCTIONS  No special instructions are needed after a transfusion. You may find your energy is better. Speak with your caregiver about any limitations on activity  for underlying diseases you may have. SEEK MEDICAL CARE IF:  Your condition is not improving after your transfusion. You develop redness or irritation at the intravenous (IV) site. SEEK IMMEDIATE MEDICAL CARE IF:  Any of the following symptoms occur over the next 12 hours: Shaking chills. You have a temperature by mouth above 102 F (38.9 C), not controlled by medicine. Chest, back, or muscle pain. People around you feel you are not acting correctly or are confused. Shortness of breath or difficulty breathing. Dizziness and fainting. You get a rash or develop hives. You have a decrease in urine output. Your urine turns a dark color or changes to pink, red, or brown. Any of the following symptoms occur over the next 10 days: You have a temperature by mouth above 102 F (38.9 C), not controlled by medicine. Shortness of breath. Weakness after normal activity. The white part of the eye turns yellow (jaundice). You have a decrease in the amount of urine or are urinating less often. Your urine turns a dark color or changes to pink, red, or brown. Document Released: 09/25/2000 Document Revised: 12/21/2011 Document Reviewed: 05/14/2008 [facility] Patient Information 2014 [place], [place].
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
## 2024-06-13 NOTE — Progress Notes (Signed)
 COVID Vaccine received:  []  No [x]  Yes Date of any COVID positive Test in last 90 days: Yes-  June PCP - none Cardiologist - Dr. DOROTHA Lesches Pulmonary- Dr. Geronimo  Chest x-ray - Chest CT 04/17/24 Epic EKG -  05/15/24 Epic Stress Test -  ECHO - 09/28/20 EPIC Cardiac Cath - 12/09/09 Epic  Cardiac clearance 05/15/24- Dr. Dorn Lesches  Bowel Prep - [x]  No  []   Yes ______  Pacemaker / ICD device [x]  No []  Yes   Spinal Cord Stimulator:[x]  No []  Yes       History of Sleep Apnea? [x]  No []  Yes   CPAP used?- [x]  No []  Yes    Does the patient monitor blood sugar?          [x]  No []  Yes  []  N/A  Patient has: [x]  NO Hx DM   []  Pre-DM                 []  DM1  []   DM2 Does patient have a [employer] or Dexacom? []  No []  Yes   Fasting Blood Sugar Ranges-  Checks Blood Sugar _____ times a day  GLP1 agonist / usual dose - no GLP1 instructions:  SGLT-2 inhibitors / usual dose - no SGLT-2 instructions:   Blood Thinner / Instructions:no Aspirin  Instructions:81 mg ASA. Last dose 06/15/24  Comments:   Activity level: Patient is able to climb a flight of stairs without difficulty; [x]  No CP  [x]  No SOB,   Patient canperform ADLs without assistance.   Anesthesia review: CAD- stent 2010, HTN, Emphysema, smoker  Patient denies shortness of breath, fever, cough and chest pain at PAT appointment.  Patient verbalized understanding and agreement to the Pre-Surgical Instructions that were given to them at this PAT appointment. Patient was also educated of the need to review these PAT instructions again prior to his/her surgery.I reviewed the appropriate phone numbers to call if they have any and questions or concerns.

## 2024-06-15 ENCOUNTER — Encounter (HOSPITAL_COMMUNITY): Payer: Self-pay

## 2024-06-15 ENCOUNTER — Other Ambulatory Visit: Payer: Self-pay

## 2024-06-15 ENCOUNTER — Encounter (HOSPITAL_COMMUNITY)
Admission: RE | Admit: 2024-06-15 | Discharge: 2024-06-15 | Disposition: A | Discharge status: Home / Self Care | Source: Ambulatory Visit | Attending: Urology | Admitting: Urology

## 2024-06-15 VITALS — BP 159/104 | HR 80 | Temp 97.8°F | Resp 16 | Ht 71.0 in | Wt 241.0 lb

## 2024-06-15 DIAGNOSIS — N2889 Other specified disorders of kidney and ureter: Secondary | ICD-10-CM | POA: Insufficient documentation

## 2024-06-15 DIAGNOSIS — I10 Essential (primary) hypertension: Secondary | ICD-10-CM | POA: Insufficient documentation

## 2024-06-15 DIAGNOSIS — J439 Emphysema, unspecified: Secondary | ICD-10-CM | POA: Insufficient documentation

## 2024-06-15 DIAGNOSIS — Z01818 Encounter for other preprocedural examination: Secondary | ICD-10-CM

## 2024-06-15 DIAGNOSIS — Z955 Presence of coronary angioplasty implant and graft: Secondary | ICD-10-CM | POA: Diagnosis not present

## 2024-06-15 DIAGNOSIS — F17210 Nicotine dependence, cigarettes, uncomplicated: Secondary | ICD-10-CM | POA: Diagnosis not present

## 2024-06-15 DIAGNOSIS — R310 Gross hematuria: Secondary | ICD-10-CM | POA: Diagnosis not present

## 2024-06-15 DIAGNOSIS — Z01812 Encounter for preprocedural laboratory examination: Secondary | ICD-10-CM | POA: Insufficient documentation

## 2024-06-15 DIAGNOSIS — I2510 Atherosclerotic heart disease of native coronary artery without angina pectoris: Secondary | ICD-10-CM | POA: Insufficient documentation

## 2024-06-15 DIAGNOSIS — D4101 Neoplasm of uncertain behavior of right kidney: Secondary | ICD-10-CM | POA: Insufficient documentation

## 2024-06-15 DIAGNOSIS — D4121 Neoplasm of uncertain behavior of right ureter: Secondary | ICD-10-CM | POA: Insufficient documentation

## 2024-06-15 HISTORY — DX: Essential (primary) hypertension: I10

## 2024-06-15 HISTORY — DX: Anxiety disorder, unspecified: F41.9

## 2024-06-15 LAB — BASIC METABOLIC PANEL WITH GFR
Anion gap: 11 (ref 5–15)
BUN: 11 mg/dL (ref 6–20)
CO2: 26 mmol/L (ref 22–32)
Calcium: 8.9 mg/dL (ref 8.9–10.3)
Chloride: 98 mmol/L (ref 98–111)
Creatinine, Ser: 1.08 mg/dL (ref 0.61–1.24)
GFR, Estimated: >60 mL/min (ref >60)
Glucose, Bld: 97 mg/dL (ref 70–99)
Potassium: 4.2 mmol/L (ref 3.5–5.1)
Sodium: 135 mmol/L (ref 135–145)

## 2024-06-15 LAB — CBC
HCT: 56.5 % — ABNORMAL HIGH (ref 39.0–52.0)
Hemoglobin: 17.6 g/dL — ABNORMAL HIGH (ref 13.0–17.0)
MCH: 28.5 pg (ref 26.0–34.0)
MCHC: 31.2 g/dL (ref 30.0–36.0)
MCV: 91.6 fL (ref 80.0–100.0)
Platelets: 223 K/uL (ref 150–400)
RBC: 6.17 MIL/uL — ABNORMAL HIGH (ref 4.22–5.81)
RDW: 15.2 % (ref 11.5–15.5)
WBC: 14.9 K/uL — ABNORMAL HIGH (ref 4.0–10.5)
nRBC: 0 % (ref 0.0–0.2)

## 2024-06-15 LAB — TYPE AND SCREEN
ABO/RH(D): O POS
Antibody Screen: NEGATIVE

## 2024-06-20 NOTE — Progress Notes (Signed)
 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 smoker with h/o HTN, CAD s/p DES 12/28/2008, emphysema, large right cystic kidney mass scheduled for above procedure 06/28/24 with Dr. Ricardo Alvaro.   Pt seen by pulmonology 06/13/24 for preoperative evaluation. Per notes pt is low risk for prolonged ventilator dependence, moderate risk for pneumonia, atelectasis.   Pt last seen by cardiology 05/15/2024. Per notes pt asymptomatic. Since I saw him a year ago he is remained stable. He is still smoking 1/2 pack/day. He has lost 25 pounds as a result of diet. Apparently he needs a partial nephrectomy done laparoscopically on 06/28/2024. I believe he is at low risk for this.   VS: BP (!) 159/104   Pulse 80   Temp 36.6 C (Oral)   Resp 16   Ht 5' 11 (1.803 m)   Wt 109.3 kg   SpO2 96%   BMI 33.61 kg/m   PROVIDERS: Pcp, No Cardiologist - Dr. Dorn Lesches Pulmonary- Dr. Geronimo LABS: Labs reviewed: Acceptable for surgery. (all labs ordered are listed, but only abnormal results are displayed)  Labs Reviewed  CBC - Abnormal; Notable for the following components:      Result Value   WBC 14.9 (*)    RBC 6.17 (*)    Hemoglobin 17.6 (*)    HCT 56.5 (*)    All other components within normal limits  BASIC METABOLIC PANEL WITH GFR  TYPE AND SCREEN     IMAGES:   EKG:   CV: Echo 09/28/20 1. Left ventricular ejection fraction, by estimation, is 60 to 65%. The  left ventricle has normal function. The left ventricle has no regional  wall motion abnormalities. There is mild left ventricular hypertrophy.   2. Right  ventricular systolic function is normal. The right ventricular  size is normal.   3. The mitral valve is normal in structure. No evidence of mitral valve  regurgitation. No evidence of mitral stenosis.   4. The aortic valve is tricuspid.   5. The inferior vena cava is normal in size with greater than 50%  respiratory variability, suggesting right atrial pressure of 3 mmHg.  Past Medical History:  Diagnosis Date   Anxiety    Coronary artery disease per pt last cardiology visit 2011 (found documentation 12-05-2009 w/ dr malva with [facility] in epic)/  currently pt is followed by pcp   12-28-2008  PCI and DES x2 to proximal and mid RCA/ last cardiac cath 12-09-2009  widely patent stents   GERD (gastroesophageal reflux disease)    Hyperlipidemia    Hypertension    Olecranon bursitis of left elbow    Respiratory failure (HCC)    S/P right coronary artery (RCA) stent placement 12/28/2008   PCI and DES x2-- proximal and mid RCA   Wears dentures    UPPER    Past Surgical History:  Procedure Laterality Date   CARDIAC CATHETERIZATION  12-09-2009  dr annis   abnormal stress test (12-05-2009) widely patent coronaries present w/ evidence of diffuse atherosclerotic disease/  widely patent stented segments in the proximal and  mid segment RCA/  LVEF 65%   CORONARY ANGIOPLASTY WITH STENT PLACEMENT  12-28-2008  dr berry   PCI and stenting of the proxmial and mid RCA using cutting balloon atherectomy (x2 DES, Xience)/  LVEF >60% w/ normal wall motion   ESOPHAGOGASTRODUODENOSCOPY N/A 11/17/2019   Procedure: ESOPHAGOGASTRODUODENOSCOPY (EGD);  Surgeon: Rollin Dover, MD;  Location: [facility];  Service: Gastroenterology;  Laterality: N/A;   KNEE ARTHROSCOPY W/ MENISCECTOMY Right 2002   NEUROPLASTY / TRANSPOSITION ULNAR NERVE AT ELBOW Right 10/2014   dr shari CONNORS BURSECTOMY Left 05/06/2017   Procedure: LEFT ELBOW OLECRANON BURSA EXCISION;  Surgeon: shari Easter, MD;  [facility];  Service: Orthopedics;  Laterality: Left;   ORIF RIGHT ANKLE FX  2006   TONSILLECTOMY  age 27   TRANSTHORACIC ECHOCARDIOGRAM  03/30/2016   mild concentric LVH, ef 50-55%/  trivial TR    MEDICATIONS:  albuterol  (VENTOLIN  HFA) 108 (90 Base) MCG/ACT inhaler   aspirin  EC 81 MG tablet   loratadine-pseudoephedrine (CLARITIN-D 24-HOUR) 10-240 MG 24 hr tablet   predniSONE  (DELTASONE ) 10 MG tablet   SPIRIVA  RESPIMAT 2.5 MCG/ACT AERS   vitamin B-12 (CYANOCOBALAMIN) 1000 MCG tablet   No current facility-administered medications for this encounter.      Harlene Hoots Ward, PA-C [facility] [phone]

## 2024-06-20 NOTE — Anesthesia Preprocedure Evaluation (Addendum)
 Anesthesia Evaluation  Patient identified by MRN, date of birth, ID band Patient awake    Reviewed: Allergy & Precautions, NPO status , Patient's Chart, lab work & pertinent test results  Airway Mallampati: II  TM Distance: >3 FB Neck ROM: Full    Dental  (+) Upper Dentures, Dental Advisory Given   Pulmonary COPD,  COPD inhaler, Current Smoker and Patient abstained from smoking.   Pulmonary exam normal breath sounds clear to auscultation       Cardiovascular hypertension, Pt. on medications + CAD and + Cardiac Stents  Normal cardiovascular exam Rhythm:Regular Rate:Normal  PCI and DES x2-- proximal and mid RCA 12/2008   Neuro/Psych  PSYCHIATRIC DISORDERS Anxiety     negative neurological ROS     GI/Hepatic Neg liver ROS,GERD  Medicated,,  Endo/Other  HLD Obesity  Renal/GU Renal diseaseLarge right renal cystic mass Gross hematuria  negative genitourinary   Musculoskeletal negative musculoskeletal ROS (+)    Abdominal  (+) + obese  Peds  Hematology negative hematology ROS (+)   Anesthesia Other Findings   Reproductive/Obstetrics                              Anesthesia Physical Anesthesia Plan  ASA: 3  Anesthesia Plan: General   Post-op Pain Management: Ofirmev  IV (intra-op)*, Precedex  and Dilaudid  IV   Induction: Intravenous  PONV Risk Score and Plan: 3 and Treatment may vary due to age or medical condition, Midazolam , Dexamethasone  and Ondansetron   Airway Management Planned: Oral ETT  Additional Equipment: None  Intra-op Plan:   Post-operative Plan:   Informed Consent: I have reviewed the patients History and Physical, chart, labs and discussed the procedure including the risks, benefits and alternatives for the proposed anesthesia with the patient or authorized representative who has indicated his/her understanding and acceptance.     Dental advisory given  Plan  Discussed with: CRNA and Anesthesiologist  Anesthesia Plan Comments: (See PAT note 06/15/24)         Anesthesia Quick Evaluation

## 2024-06-28 ENCOUNTER — Inpatient Hospital Stay (HOSPITAL_COMMUNITY)
Admission: RE | Admit: 2024-06-28 | Discharge: 2024-07-03 | DRG: 656 | Discharge status: Against Medical Advice | Attending: Urology | Admitting: Urology

## 2024-06-28 ENCOUNTER — Inpatient Hospital Stay (HOSPITAL_COMMUNITY): Payer: Self-pay | Admitting: Anesthesiology

## 2024-06-28 ENCOUNTER — Encounter (HOSPITAL_COMMUNITY): Payer: Self-pay | Admitting: Urology

## 2024-06-28 ENCOUNTER — Observation Stay (HOSPITAL_COMMUNITY)

## 2024-06-28 ENCOUNTER — Encounter (HOSPITAL_COMMUNITY)
Admission: RE | Discharge status: Against Medical Advice | Payer: Self-pay | Source: Home / Self Care | Attending: Urology

## 2024-06-28 ENCOUNTER — Ambulatory Visit (HOSPITAL_COMMUNITY): Payer: Self-pay | Admitting: Medical

## 2024-06-28 DIAGNOSIS — Z9109 Other allergy status, other than to drugs and biological substances: Secondary | ICD-10-CM

## 2024-06-28 DIAGNOSIS — Z885 Allergy status to narcotic agent status: Secondary | ICD-10-CM

## 2024-06-28 DIAGNOSIS — Z720 Tobacco use: Secondary | ICD-10-CM | POA: Diagnosis present

## 2024-06-28 DIAGNOSIS — R1110 Vomiting, unspecified: Secondary | ICD-10-CM | POA: Diagnosis not present

## 2024-06-28 DIAGNOSIS — D72829 Elevated white blood cell count, unspecified: Secondary | ICD-10-CM | POA: Diagnosis present

## 2024-06-28 DIAGNOSIS — I2510 Atherosclerotic heart disease of native coronary artery without angina pectoris: Secondary | ICD-10-CM | POA: Diagnosis not present

## 2024-06-28 DIAGNOSIS — Z91030 Bee allergy status: Secondary | ICD-10-CM

## 2024-06-28 DIAGNOSIS — E875 Hyperkalemia: Secondary | ICD-10-CM | POA: Diagnosis present

## 2024-06-28 DIAGNOSIS — J984 Other disorders of lung: Secondary | ICD-10-CM | POA: Diagnosis not present

## 2024-06-28 DIAGNOSIS — E785 Hyperlipidemia, unspecified: Secondary | ICD-10-CM | POA: Diagnosis not present

## 2024-06-28 DIAGNOSIS — F17210 Nicotine dependence, cigarettes, uncomplicated: Secondary | ICD-10-CM

## 2024-06-28 DIAGNOSIS — J9601 Acute respiratory failure with hypoxia: Principal | ICD-10-CM | POA: Diagnosis not present

## 2024-06-28 DIAGNOSIS — Z955 Presence of coronary angioplasty implant and graft: Secondary | ICD-10-CM

## 2024-06-28 DIAGNOSIS — S27803A Laceration of diaphragm, initial encounter: Secondary | ICD-10-CM | POA: Diagnosis not present

## 2024-06-28 DIAGNOSIS — J95811 Postprocedural pneumothorax: Secondary | ICD-10-CM | POA: Diagnosis not present

## 2024-06-28 DIAGNOSIS — J8410 Pulmonary fibrosis, unspecified: Secondary | ICD-10-CM | POA: Diagnosis not present

## 2024-06-28 DIAGNOSIS — U099 Post covid-19 condition, unspecified: Secondary | ICD-10-CM | POA: Diagnosis not present

## 2024-06-28 DIAGNOSIS — E8770 Fluid overload, unspecified: Secondary | ICD-10-CM | POA: Diagnosis present

## 2024-06-28 DIAGNOSIS — Z9889 Other specified postprocedural states: Secondary | ICD-10-CM | POA: Diagnosis not present

## 2024-06-28 DIAGNOSIS — N2889 Other specified disorders of kidney and ureter: Secondary | ICD-10-CM | POA: Diagnosis not present

## 2024-06-28 DIAGNOSIS — I10 Essential (primary) hypertension: Secondary | ICD-10-CM | POA: Diagnosis not present

## 2024-06-28 DIAGNOSIS — R0989 Other specified symptoms and signs involving the circulatory and respiratory systems: Secondary | ICD-10-CM | POA: Diagnosis not present

## 2024-06-28 DIAGNOSIS — J939 Pneumothorax, unspecified: Secondary | ICD-10-CM | POA: Diagnosis not present

## 2024-06-28 DIAGNOSIS — T797XXA Traumatic subcutaneous emphysema, initial encounter: Secondary | ICD-10-CM | POA: Diagnosis not present

## 2024-06-28 DIAGNOSIS — N9989 Other postprocedural complications and disorders of genitourinary system: Secondary | ICD-10-CM | POA: Diagnosis not present

## 2024-06-28 DIAGNOSIS — R58 Hemorrhage, not elsewhere classified: Secondary | ICD-10-CM | POA: Diagnosis present

## 2024-06-28 DIAGNOSIS — K219 Gastro-esophageal reflux disease without esophagitis: Secondary | ICD-10-CM | POA: Diagnosis present

## 2024-06-28 DIAGNOSIS — Z7982 Long term (current) use of aspirin: Secondary | ICD-10-CM

## 2024-06-28 DIAGNOSIS — E66811 Obesity, class 1: Secondary | ICD-10-CM | POA: Diagnosis present

## 2024-06-28 DIAGNOSIS — C641 Malignant neoplasm of right kidney, except renal pelvis: Secondary | ICD-10-CM | POA: Diagnosis not present

## 2024-06-28 DIAGNOSIS — J439 Emphysema, unspecified: Secondary | ICD-10-CM | POA: Diagnosis present

## 2024-06-28 DIAGNOSIS — Y838 Other surgical procedures as the cause of abnormal reaction of the patient, or of later complication, without mention of misadventure at the time of the procedure: Secondary | ICD-10-CM | POA: Diagnosis not present

## 2024-06-28 DIAGNOSIS — Z5329 Procedure and treatment not carried out because of patient's decision for other reasons: Secondary | ICD-10-CM | POA: Diagnosis not present

## 2024-06-28 DIAGNOSIS — Z4682 Encounter for fitting and adjustment of non-vascular catheter: Secondary | ICD-10-CM | POA: Diagnosis not present

## 2024-06-28 DIAGNOSIS — F419 Anxiety disorder, unspecified: Secondary | ICD-10-CM | POA: Diagnosis present

## 2024-06-28 DIAGNOSIS — N280 Ischemia and infarction of kidney: Secondary | ICD-10-CM | POA: Diagnosis not present

## 2024-06-28 DIAGNOSIS — R918 Other nonspecific abnormal finding of lung field: Secondary | ICD-10-CM | POA: Diagnosis not present

## 2024-06-28 DIAGNOSIS — Z8616 Personal history of COVID-19: Secondary | ICD-10-CM

## 2024-06-28 DIAGNOSIS — R911 Solitary pulmonary nodule: Secondary | ICD-10-CM | POA: Diagnosis present

## 2024-06-28 DIAGNOSIS — N281 Cyst of kidney, acquired: Principal | ICD-10-CM | POA: Diagnosis present

## 2024-06-28 DIAGNOSIS — I509 Heart failure, unspecified: Secondary | ICD-10-CM | POA: Diagnosis not present

## 2024-06-28 DIAGNOSIS — R0602 Shortness of breath: Secondary | ICD-10-CM | POA: Diagnosis not present

## 2024-06-28 DIAGNOSIS — E8809 Other disorders of plasma-protein metabolism, not elsewhere classified: Secondary | ICD-10-CM | POA: Diagnosis present

## 2024-06-28 DIAGNOSIS — E871 Hypo-osmolality and hyponatremia: Secondary | ICD-10-CM | POA: Diagnosis present

## 2024-06-28 DIAGNOSIS — Z6833 Body mass index (BMI) 33.0-33.9, adult: Secondary | ICD-10-CM

## 2024-06-28 DIAGNOSIS — Z1152 Encounter for screening for COVID-19: Secondary | ICD-10-CM

## 2024-06-28 HISTORY — PX: ROBOTIC ASSITED PARTIAL NEPHRECTOMY: SHX6087

## 2024-06-28 LAB — HEMOGLOBIN AND HEMATOCRIT, BLOOD
HCT: 54.5 % — ABNORMAL HIGH (ref 39.0–52.0)
Hemoglobin: 16.8 g/dL (ref 13.0–17.0)

## 2024-06-28 SURGERY — NEPHRECTOMY, PARTIAL, ROBOT-ASSISTED
Anesthesia: General | Laterality: Right

## 2024-06-28 MED ORDER — KETAMINE HCL 50 MG/5ML IJ SOSY
PREFILLED_SYRINGE | INTRAMUSCULAR | Status: DC | PRN
  Administered 2024-06-28: 30 mg via INTRAVENOUS

## 2024-06-28 MED ORDER — SUGAMMADEX SODIUM 200 MG/2ML IV SOLN
INTRAVENOUS | Status: DC | PRN
  Administered 2024-06-28: 100 mg via INTRAVENOUS
  Administered 2024-06-28: 200 mg via INTRAVENOUS

## 2024-06-28 MED ORDER — CHLORHEXIDINE GLUCONATE 0.12 % MT SOLN
15.0000 mL | Freq: Once | OROMUCOSAL | Status: AC
  Administered 2024-06-28: 15 mL via OROMUCOSAL

## 2024-06-28 MED ORDER — ROCURONIUM BROMIDE 100 MG/10ML IV SOLN
INTRAVENOUS | Status: DC | PRN
  Administered 2024-06-28: 20 mg via INTRAVENOUS
  Administered 2024-06-28: 80 mg via INTRAVENOUS

## 2024-06-28 MED ORDER — ONDANSETRON HCL 4 MG/2ML IJ SOLN
INTRAMUSCULAR | Status: DC | PRN
  Administered 2024-06-28: 4 mg via INTRAVENOUS

## 2024-06-28 MED ORDER — DIPHENHYDRAMINE HCL 12.5 MG/5ML PO ELIX: 12.5000 mg | ORAL_SOLUTION | Freq: Four times a day (QID) | ORAL | Status: DC | PRN

## 2024-06-28 MED ORDER — SODIUM CHLORIDE 0.9% FLUSH
INTRAVENOUS | Status: DC | PRN
  Administered 2024-06-28: 20 mL

## 2024-06-28 MED ORDER — ONDANSETRON HCL 4 MG/2ML IJ SOLN
4.0000 mg | INTRAMUSCULAR | Status: DC | PRN
  Administered 2024-06-28 – 2024-06-29 (×2): 4 mg via INTRAVENOUS
  Filled 2024-06-28 (×2): qty 2

## 2024-06-28 MED ORDER — PROPOFOL 1000 MG/100ML IV EMUL
INTRAVENOUS | Status: AC
  Filled 2024-06-28: qty 100

## 2024-06-28 MED ORDER — DEXAMETHASONE SODIUM PHOSPHATE 10 MG/ML IJ SOLN
INTRAMUSCULAR | Status: AC
  Filled 2024-06-28: qty 1

## 2024-06-28 MED ORDER — HYDROMORPHONE HCL 1 MG/ML IJ SOLN
0.5000 mg | INTRAMUSCULAR | Status: DC | PRN
  Administered 2024-06-29 – 2024-07-01 (×3): 1 mg via INTRAVENOUS
  Filled 2024-06-28 (×3): qty 1

## 2024-06-28 MED ORDER — DEXAMETHASONE SODIUM PHOSPHATE 10 MG/ML IJ SOLN
INTRAMUSCULAR | Status: DC | PRN
  Administered 2024-06-28: 10 mg via INTRAVENOUS

## 2024-06-28 MED ORDER — TRIPLE ANTIBIOTIC 3.5-400-5000 EX OINT: 1.0000 | TOPICAL_OINTMENT | Freq: Three times a day (TID) | CUTANEOUS | Status: DC | PRN

## 2024-06-28 MED ORDER — STERILE WATER FOR IRRIGATION IR SOLN
Status: DC | PRN
  Administered 2024-06-28: 1000 mL

## 2024-06-28 MED ORDER — ORAL CARE MOUTH RINSE: 15.0000 mL | OROMUCOSAL | Status: DC | PRN

## 2024-06-28 MED ORDER — PHENYLEPHRINE HCL (PRESSORS) 10 MG/ML IV SOLN
INTRAVENOUS | Status: DC | PRN
  Administered 2024-06-28: 80 ug via INTRAVENOUS
  Administered 2024-06-28: 160 ug via INTRAVENOUS

## 2024-06-28 MED ORDER — UMECLIDINIUM BROMIDE 62.5 MCG/ACT IN AEPB
1.0000 | INHALATION_SPRAY | Freq: Every day | RESPIRATORY_TRACT | Status: DC
Start: 2024-06-29 — End: 2024-07-01
  Administered 2024-07-01: 1 via RESPIRATORY_TRACT
  Filled 2024-06-28 (×2): qty 7

## 2024-06-28 MED ORDER — HYOSCYAMINE SULFATE 0.125 MG SL SUBL: 0.1250 mg | SUBLINGUAL_TABLET | SUBLINGUAL | Status: DC | PRN

## 2024-06-28 MED ORDER — SUGAMMADEX SODIUM 200 MG/2ML IV SOLN
INTRAVENOUS | Status: AC
  Filled 2024-06-28: qty 2

## 2024-06-28 MED ORDER — SODIUM CHLORIDE (PF) 0.9 % IJ SOLN
INTRAMUSCULAR | Status: AC
Start: 2024-06-28 — End: 2024-06-28
  Filled 2024-06-28: qty 20

## 2024-06-28 MED ORDER — SODIUM CHLORIDE 0.9 % IV SOLN: INTRAVENOUS | Status: AC

## 2024-06-28 MED ORDER — PHENYLEPHRINE HCL-NACL 20-0.9 MG/250ML-% IV SOLN
INTRAVENOUS | Status: DC | PRN
  Administered 2024-06-28: 20 ug/min via INTRAVENOUS

## 2024-06-28 MED ORDER — ONDANSETRON HCL 4 MG/2ML IJ SOLN
INTRAMUSCULAR | Status: AC
  Filled 2024-06-28: qty 2

## 2024-06-28 MED ORDER — ACETAMINOPHEN 10 MG/ML IV SOLN
INTRAVENOUS | Status: AC
  Filled 2024-06-28: qty 100

## 2024-06-28 MED ORDER — BUPIVACAINE LIPOSOME 1.3 % IJ SUSP
INTRAMUSCULAR | Status: DC | PRN
  Administered 2024-06-28: 20 mL

## 2024-06-28 MED ORDER — ACETAMINOPHEN 500 MG PO TABS
1000.0000 mg | ORAL_TABLET | Freq: Four times a day (QID) | ORAL | Status: AC
Start: 2024-06-28 — End: 2024-06-29
  Administered 2024-06-28 – 2024-06-29 (×2): 1000 mg via ORAL
  Filled 2024-06-28 (×4): qty 2

## 2024-06-28 MED ORDER — TIOTROPIUM BROMIDE MONOHYDRATE 2.5 MCG/ACT IN AERS: 2.5000 ug | INHALATION_SPRAY | Freq: Two times a day (BID) | RESPIRATORY_TRACT | Status: DC

## 2024-06-28 MED ORDER — LACTATED RINGERS IV SOLN: INTRAVENOUS | Status: DC

## 2024-06-28 MED ORDER — DROPERIDOL 2.5 MG/ML IJ SOLN: 0.6250 mg | Freq: Once | INTRAMUSCULAR | Status: DC | PRN

## 2024-06-28 MED ORDER — TRAMADOL HCL 50 MG PO TABS
50.0000 mg | ORAL_TABLET | Freq: Four times a day (QID) | ORAL | Status: DC | PRN
  Administered 2024-06-30 (×2): 100 mg via ORAL
  Filled 2024-06-28 (×2): qty 2

## 2024-06-28 MED ORDER — MIDAZOLAM HCL 5 MG/5ML IJ SOLN
INTRAMUSCULAR | Status: DC | PRN
  Administered 2024-06-28: 2 mg via INTRAVENOUS

## 2024-06-28 MED ORDER — DOCUSATE SODIUM 100 MG PO CAPS: 100.0000 mg | ORAL_CAPSULE | Freq: Two times a day (BID) | ORAL | Status: AC

## 2024-06-28 MED ORDER — HYDROMORPHONE HCL 1 MG/ML IJ SOLN
INTRAMUSCULAR | Status: DC | PRN
  Administered 2024-06-28 (×2): 1 mg via INTRAVENOUS

## 2024-06-28 MED ORDER — PROPOFOL 10 MG/ML IV BOLUS
INTRAVENOUS | Status: DC | PRN
  Administered 2024-06-28: 50 ug/kg/min via INTRAVENOUS
  Administered 2024-06-28: 180 mg via INTRAVENOUS

## 2024-06-28 MED ORDER — ALBUTEROL SULFATE (2.5 MG/3ML) 0.083% IN NEBU
3.0000 mL | INHALATION_SOLUTION | Freq: Four times a day (QID) | RESPIRATORY_TRACT | Status: DC | PRN
  Administered 2024-06-30: 3 mL via RESPIRATORY_TRACT
  Filled 2024-06-28: qty 3

## 2024-06-28 MED ORDER — MAGNESIUM CITRATE PO SOLN: 1.0000 | Freq: Once | ORAL | Status: DC

## 2024-06-28 MED ORDER — LIDOCAINE HCL (CARDIAC) PF 100 MG/5ML IV SOSY
PREFILLED_SYRINGE | INTRAVENOUS | Status: DC | PRN
  Administered 2024-06-28: 100 mg via INTRAVENOUS

## 2024-06-28 MED ORDER — DIPHENHYDRAMINE HCL 50 MG/ML IJ SOLN: 12.5000 mg | Freq: Four times a day (QID) | INTRAMUSCULAR | Status: DC | PRN

## 2024-06-28 MED ORDER — OXYCODONE HCL 5 MG PO TABS: 5.0000 mg | ORAL_TABLET | Freq: Once | ORAL | Status: DC | PRN

## 2024-06-28 MED ORDER — TRAMADOL HCL 50 MG PO TABS: 50.0000 mg | ORAL_TABLET | Freq: Four times a day (QID) | ORAL | 0 refills | Status: DC | PRN

## 2024-06-28 MED ORDER — FENTANYL CITRATE (PF) 100 MCG/2ML IJ SOLN
INTRAMUSCULAR | Status: DC | PRN
  Administered 2024-06-28: 100 ug via INTRAVENOUS

## 2024-06-28 MED ORDER — UMECLIDINIUM BROMIDE 62.5 MCG/ACT IN AEPB
1.0000 | INHALATION_SPRAY | Freq: Every day | RESPIRATORY_TRACT | Status: DC
Start: 2024-06-28 — End: 2024-06-28
  Filled 2024-06-28: qty 7

## 2024-06-28 MED ORDER — HYDROMORPHONE HCL 1 MG/ML IJ SOLN: 0.2500 mg | INTRAMUSCULAR | Status: DC | PRN

## 2024-06-28 MED ORDER — OXYCODONE HCL 5 MG/5ML PO SOLN: 5.0000 mg | Freq: Once | ORAL | Status: DC | PRN

## 2024-06-28 MED ORDER — ORAL CARE MOUTH RINSE: 15.0000 mL | Freq: Once | OROMUCOSAL | Status: AC

## 2024-06-28 MED ORDER — ALBUMIN HUMAN 5 % IV SOLN: INTRAVENOUS | Status: DC | PRN

## 2024-06-28 MED ORDER — MIDAZOLAM HCL 2 MG/2ML IJ SOLN
INTRAMUSCULAR | Status: AC
  Filled 2024-06-28: qty 2

## 2024-06-28 MED ORDER — ACETAMINOPHEN 10 MG/ML IV SOLN
INTRAVENOUS | Status: DC | PRN
  Administered 2024-06-28: 1000 mg via INTRAVENOUS

## 2024-06-28 MED ORDER — HYDROMORPHONE HCL 2 MG/ML IJ SOLN
INTRAMUSCULAR | Status: AC
  Filled 2024-06-28: qty 1

## 2024-06-28 MED ORDER — KETAMINE HCL 50 MG/5ML IJ SOSY
PREFILLED_SYRINGE | INTRAMUSCULAR | Status: AC
Start: 2024-06-28 — End: 2024-06-28
  Filled 2024-06-28: qty 5

## 2024-06-28 MED ORDER — DOCUSATE SODIUM 100 MG PO CAPS
100.0000 mg | ORAL_CAPSULE | Freq: Two times a day (BID) | ORAL | Status: DC
  Administered 2024-06-29 (×2): 100 mg via ORAL
  Filled 2024-06-28 (×2): qty 1

## 2024-06-28 MED ORDER — ONDANSETRON HCL 4 MG/2ML IJ SOLN: 4.0000 mg | Freq: Once | INTRAMUSCULAR | Status: DC | PRN

## 2024-06-28 MED ORDER — FENTANYL CITRATE (PF) 100 MCG/2ML IJ SOLN
INTRAMUSCULAR | Status: AC
  Filled 2024-06-28: qty 2

## 2024-06-28 MED ORDER — ROCURONIUM BROMIDE 10 MG/ML (PF) SYRINGE
PREFILLED_SYRINGE | INTRAVENOUS | Status: AC
  Filled 2024-06-28: qty 10

## 2024-06-28 MED ORDER — LIDOCAINE HCL (PF) 2 % IJ SOLN
INTRAMUSCULAR | Status: AC
Start: 2024-06-28 — End: 2024-06-28
  Filled 2024-06-28: qty 5

## 2024-06-28 MED ORDER — CEFAZOLIN SODIUM-DEXTROSE 2-4 GM/100ML-% IV SOLN
2.0000 g | INTRAVENOUS | Status: AC
Start: 2024-06-28 — End: 2024-06-28
  Administered 2024-06-28: 2 g via INTRAVENOUS
  Filled 2024-06-28: qty 100

## 2024-06-28 MED ORDER — EPHEDRINE SULFATE (PRESSORS) 50 MG/ML IJ SOLN
INTRAMUSCULAR | Status: DC | PRN
  Administered 2024-06-28: 10 mg via INTRAVENOUS
  Administered 2024-06-28: 5 mg via INTRAVENOUS

## 2024-06-28 MED ORDER — PREDNISONE 10 MG PO TABS: 10.0000 mg | ORAL_TABLET | ORAL | Status: DC

## 2024-06-28 MED ORDER — BUPIVACAINE LIPOSOME 1.3 % IJ SUSP
INTRAMUSCULAR | Status: AC
  Filled 2024-06-28: qty 20

## 2024-06-28 MED ORDER — NALOXONE HCL 0.4 MG/ML IJ SOLN
INTRAMUSCULAR | Status: DC | PRN
  Administered 2024-06-28: 40 ug via INTRAVENOUS

## 2024-06-28 SURGICAL SUPPLY — 66 items
APPLICATOR SURGIFLO ENDO (HEMOSTASIS) ×1 IMPLANT
BAG COUNTER SPONGE SURGICOUNT (BAG) IMPLANT
CHLORAPREP W/TINT 26 (MISCELLANEOUS) ×1 IMPLANT
CLIP LIGATING HEM O LOK PURPLE (MISCELLANEOUS) ×2 IMPLANT
CLIP LIGATING HEMO LOK XL GOLD (MISCELLANEOUS) IMPLANT
CLIP LIGATING HEMO O LOK GREEN (MISCELLANEOUS) ×1 IMPLANT
CLIP SUT LAPRA TY ABSORB (SUTURE) ×1 IMPLANT
COVER SURGICAL LIGHT HANDLE (MISCELLANEOUS) ×1 IMPLANT
COVER TIP SHEARS 8 DVNC (MISCELLANEOUS) ×1 IMPLANT
CUTTER ECHEON FLEX ENDO 45 340 (ENDOMECHANICALS) IMPLANT
DERMABOND ADVANCED .7 DNX12 (GAUZE/BANDAGES/DRESSINGS) ×1 IMPLANT
DRAIN CHANNEL 15F RND FF 3/16 (WOUND CARE) ×1 IMPLANT
DRAPE ARM DVNC X/XI (DISPOSABLE) ×4 IMPLANT
DRAPE COLUMN DVNC XI (DISPOSABLE) ×1 IMPLANT
DRAPE INCISE IOBAN 66X45 STRL (DRAPES) ×1 IMPLANT
DRAPE SHEET LG 3/4 BI-LAMINATE (DRAPES) ×1 IMPLANT
DRIVER NDL LRG 8 DVNC XI (INSTRUMENTS) ×2 IMPLANT
DRIVER NDLE LRG 8 DVNC XI (INSTRUMENTS) ×2 IMPLANT
DRSG TEGADERM 4X4.75 (GAUZE/BANDAGES/DRESSINGS) ×1 IMPLANT
ELECT PENCIL ROCKER SW 15FT (MISCELLANEOUS) ×1 IMPLANT
ELECT REM PT RETURN 15FT ADLT (MISCELLANEOUS) ×1 IMPLANT
EVACUATOR SILICONE 100CC (DRAIN) ×1 IMPLANT
FORCEPS BPLR FENES DVNC XI (FORCEP) ×1 IMPLANT
FORCEPS PROGRASP DVNC XI (FORCEP) ×1 IMPLANT
GAUZE 4X4 16PLY ~~LOC~~+RFID DBL (SPONGE) ×1 IMPLANT
GAUZE SPONGE 2X2 8PLY STRL LF (GAUZE/BANDAGES/DRESSINGS) ×1 IMPLANT
GLOVE BIO SURGEON STRL SZ 6.5 (GLOVE) ×1 IMPLANT
GLOVE SURG LX STRL 7.5 STRW (GLOVE) ×2 IMPLANT
GOWN STRL REUS W/ TWL XL LVL3 (GOWN DISPOSABLE) ×2 IMPLANT
GOWN STRL SURGICAL XL XLNG (GOWN DISPOSABLE) ×1 IMPLANT
HEMOSTAT SURGICEL 4X8 (HEMOSTASIS) ×1 IMPLANT
HOLDER FOLEY CATH W/STRAP (MISCELLANEOUS) ×1 IMPLANT
IRRIGATION SUCT STRKRFLW 2 WTP (MISCELLANEOUS) ×1 IMPLANT
KIT BASIN OR (CUSTOM PROCEDURE TRAY) ×1 IMPLANT
KIT TURNOVER KIT A (KITS) ×1 IMPLANT
LOOP VESSEL MAXI BLUE (MISCELLANEOUS) ×1 IMPLANT
MARKER SKIN DUAL TIP RULER LAB (MISCELLANEOUS) ×1 IMPLANT
NDL HYPO 22X1.5 SAFETY MO (MISCELLANEOUS) ×1 IMPLANT
NDL INSUFFLATION 14GA 120MM (NEEDLE) ×1 IMPLANT
NEEDLE HYPO 22X1.5 SAFETY MO (MISCELLANEOUS) ×1 IMPLANT
NEEDLE INSUFFLATION 14GA 120MM (NEEDLE) ×1 IMPLANT
PORT ACCESS TROCAR AIRSEAL 12 (TROCAR) ×1 IMPLANT
PROTECTOR NERVE ULNAR (MISCELLANEOUS) ×2 IMPLANT
RELOAD STAPLE 45 2.6 WHT THIN (STAPLE) IMPLANT
SCISSORS LAP 5X45 EPIX DISP (ENDOMECHANICALS) IMPLANT
SCISSORS MNPLR CVD DVNC XI (INSTRUMENTS) ×1 IMPLANT
SEAL UNIV 5-12 XI (MISCELLANEOUS) ×4 IMPLANT
SET TRI-LUMEN FLTR TB AIRSEAL (TUBING) ×1 IMPLANT
SOLUTION ELECTROSURG ANTI STCK (MISCELLANEOUS) ×1 IMPLANT
SPIKE FLUID TRANSFER (MISCELLANEOUS) ×1 IMPLANT
SPONGE T-LAP 4X18 ~~LOC~~+RFID (SPONGE) ×1 IMPLANT
SURGIFLO W/THROMBIN 8M KIT (HEMOSTASIS) ×1 IMPLANT
SUT ETHILON 3 0 PS 1 (SUTURE) ×1 IMPLANT
SUT MNCRL AB 4-0 PS2 18 (SUTURE) ×2 IMPLANT
SUT PDS AB 1 CT1 27 (SUTURE) ×2 IMPLANT
SUT VIC AB 0 CT1 27XBRD ANTBC (SUTURE) ×4 IMPLANT
SUT VIC AB 2-0 SH 27X BRD (SUTURE) ×2 IMPLANT
SUTURE V-LC BRB 180 2/0GR6GS22 (SUTURE) IMPLANT
SUTURE VLOC BRB 180 ABS3/0GR12 (SUTURE) ×1 IMPLANT
SYSTEM BAG RETRIEVAL 10MM (BASKET) ×1 IMPLANT
TOWEL OR 17X26 10 PK STRL BLUE (TOWEL DISPOSABLE) ×1 IMPLANT
TRAY FOLEY MTR SLVR 16FR STAT (SET/KITS/TRAYS/PACK) ×1 IMPLANT
TRAY LAPAROSCOPIC (CUSTOM PROCEDURE TRAY) ×1 IMPLANT
TROCAR Z THREAD OPTICAL 12X100 (TROCAR) ×1 IMPLANT
TROCAR Z-THREAD OPTICAL 5X100M (TROCAR) IMPLANT
WATER STERILE IRR 1000ML POUR (IV SOLUTION) ×2 IMPLANT

## 2024-06-28 NOTE — Op Note (Signed)
 CHEST TUBE PLACEMENT:  In PACU pt noted to have decreased sats more than expected. Stat CXR with moderate Rt pneumothorax. 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 Decision made to place pigtail chest tube.  Rt lateral chest prepped with CHG midaxillary line at inerior mammary crease. 3cc lidocaine  injected. Finder needle with aspiration just below rib identified air pocket. Wire advanced. 6mm skin incision made. 33F dilator over wire. 33F pigtail chest tube placed (about 6 inches beyond skin), obturator removed, connected to pleurivac suction, sewn in place with silk.  FU CXR with resolved pneumothorax.  Dr. Sheldon general surgeyr on call gratiously assited and will help manage the tube post-op.  Wife updated by phone.   Stepdown tonight.

## 2024-06-28 NOTE — Anesthesia Postprocedure Evaluation (Signed)
 Anesthesia Post Note  Patient: Matthew [name]  Procedure(s) Performed: NEPHRECTOMY, PARTIAL, ROBOT-ASSISTED (Right)     Patient location during evaluation: PACU Anesthesia Type: General Level of consciousness: awake and alert and oriented Pain management: pain level controlled Vital Signs Assessment: post-procedure vital signs reviewed and stable Respiratory status: spontaneous breathing, nonlabored ventilation, respiratory function stable and non-rebreather facemask Cardiovascular status: blood pressure returned to baseline and stable Postop Assessment: no apparent nausea or vomiting Anesthetic complications: no Comments: Patient started to desaturate into the 80's after arrival in PACU. Portable CXR done which revealed a right Ptx. Thoracostomy tube placed by surgeon. O2 sats progressively increasing to 94% on 100% NRB. Repeat Portable CXR revealed good expansion of lung with minimal Ptx. Patient stable for D/C to stepdown.   No notable events documented.  Last Vitals:  Vitals:   06/28/24 1615 06/28/24 1630  BP: 126/87 114/85  Pulse: 74 80  Resp: 16 16  Temp:    SpO2: 91% 90%    Last Pain:  Vitals:   06/28/24 1042  TempSrc:   PainSc: 0-No pain                 Bryceton Hantz A.

## 2024-06-28 NOTE — Brief Op Note (Signed)
 06/28/2024  2:48 PM  PATIENT:  Matthew [name]  58 y.o. male  PRE-OPERATIVE DIAGNOSIS:  LARGE RIGHT CYSTIC KIDNEY MASS  POST-OPERATIVE DIAGNOSIS:  LARGE RIGHT CYSTIC KIDNEY MASS  PROCEDURE:  Procedure(s): NEPHRECTOMY, PARTIAL, ROBOT-ASSISTED (Right)  SURGEON:  Surgeons and Role:    * Manny, Ricardo KATHEE Raddle., MD - Primary  PHYSICIAN ASSISTANT:   ASSISTANTS: Alan Hammonds PA   ANESTHESIA:   local and general  EBL:  150 mL   BLOOD ADMINISTERED:none  DRAINS: 1- JP to bulb; 2 - foley to gravity   LOCAL MEDICATIONS USED:  MARCAINE      SPECIMEN:  Source of Specimen:  Rt partial nephrectomy; cyst wall frozen section  DISPOSITION OF SPECIMEN:  PATHOLOGY  COUNTS:  YES  TOURNIQUET:  * No tourniquets in log *  DICTATION: .Other Dictation: Dictation Number [id number]  PLAN OF CARE: Admit for overnight observation  PATIENT DISPOSITION:  PACU - hemodynamically stable.   Delay start of Pharmacological VTE agent (>24hrs) due to surgical blood loss or risk of bleeding: yes

## 2024-06-28 NOTE — Transfer of Care (Signed)
 Immediate Anesthesia Transfer of Care Note  Patient: Matthew [name]  Procedure(s) Performed: NEPHRECTOMY, PARTIAL, ROBOT-ASSISTED (Right)  Patient Location: PACU  Anesthesia Type:General  Level of Consciousness: drowsy  Airway & Oxygen Therapy: Patient Spontanous Breathing and non-rebreather face mask  Post-op Assessment:   Pt. O2 sats noted to be 88-90% on non-rebreather.  Chest x-ray ordered.   Post vital signs: Reviewed and stable  Last Vitals:  Vitals Value Taken Time  BP 136/99 06/28/24 15:15  Temp    Pulse 85 06/28/24 15:18  Resp 19 06/28/24 15:18  SpO2 90 % 06/28/24 15:18  Vitals shown include unfiled device data.  Last Pain:  Vitals:   06/28/24 1042  TempSrc:   PainSc: 0-No pain         Complications: No notable events documented.

## 2024-06-28 NOTE — Consult Note (Addendum)
 Matthew [name]  [birth year] [id number]  CARE TEAM:  PCP: Freddrick, No  Outpatient Care Team: Patient Care Team: Pcp, No as PCP - General Court Dorn PARAS, MD as Consulting Physician (Cardiology)  Inpatient Treatment Team: Treatment Team:  Matthew [name]., MD Cory Palma, PA-C Crutchfield, Ocie HERO, RN Lafe Oneil CROME, RN Ccs, Md, MD   This patient is a 58 y.o.male who presents today for surgical evaluation at the request of Dr Matthew.   Chief complaint / Reason for evaluation: Postop right pneumothorax  58 year old male with large complex right renal cystic mass.  Underwent robotically assisted partial nephrectomy by Dr. Alvaro.  No major intraoperative issues noted.  Some obstructive diaphragm but no obvious diaphragmatic breach.  Patient with persistent hypoxia in recovery room and chest x-ray showing obvious right pneumothorax.  No improvement on full oxygen mask.  Surgical consultation requested.  I was bedside to help Dr. Alvaro place a percutaneous pneumothorax tube which went smoothly.  Truck driver with moderate intense activity.  Some history of stable coronary disease, stenting of right coronary artery in 2010.  No longer on any anticoagulation., GERD, hypertension, hyperlipidemia.   Assessment  Matthew [name]  58 y.o. male  * Day of Surgery *  Procedure(s): NEPHRECTOMY, PARTIAL, ROBOT-ASSISTED  Problem List:  Principal Problem:   Renal cyst   Right-sided pneumothorax after dissection of large complex cyst on right kidney improved after chest tube placement  Plan: Chest tube placement of 14 French pigtail chest tube done using Seldinger technique under sterile conditions.  Done in PACU today by Dr. Alvaro.  I was at the bedside sterilely scrubbed and assisted throughout the entire procedure.  See Dr Haroldine separate note.  CT placed on low wall suction.  Chest x-ray to my view shows good.  Reinsufflation of right lung & PTX resolved.  There is no  air leak noted.    Agree with stepdown unit monitorization given chest tube status.  Follow-up chest x-ray in morning.  If lung remains up with no leak, can consider waterseal in the morning since it is primarily capnothorax & should resolve more quickly.  If that fails, then may have to place back on sunction & wait longer.  We will see    Pathology per [facility]  -monitor electrolytes & replace as needed  Keep K>4, Mg>2, Phos>3  -VTE prophylaxis- SCDs.  Anticoagulation prophyllaxis SQ as appropriate  -mobilize as tolerated to help recovery.  Enlist therapies in moderate/high risk patients as appropriate  I updated the patient's status to the patient, PACU nurse, and Dr Matthew updated patient's wife.  Recommendations were made.  Questions were answered.  They expressed understanding & appreciation.     I reviewed nursing notes, Consultant Urology notes, last 24 h vitals and pain scores, last 48 h intake and output, last 24 h labs and trends, and last 24 h imaging results. I have reviewed this patient's available data, including medical history, events of note, test results, etc as part of my evaluation.  A significant portion of that time was spent in counseling.  Care during the described time interval was provided by me.  This care required straight-forward level of medical decision making.  06/28/2024  Elspeth KYM Schultze, MD, FACS, MASCRS Esophageal, Gastrointestinal & Colorectal Surgery Robotic and Minimally Invasive Surgery  [facility] [address] [place], [place] [address] [phone] Fax [phone] Main  CONTACT INFORMATION: Weekday (  9AM-5PM): Call [facility] main office at [phone] Weeknight (5PM-9AM) or Weekend/Holiday: Check EPIC Web Links tab & use AMION (password  [email or web address]) for General Surgery [facility] coverage  Please, DO NOT use SecureChat  (it is not reliable communication to reach operating  surgeons & will lead to a delay in care).   Epic staff messaging available for outptient concerns needing 1-2 business day response.      06/28/2024      Past Medical History:  Diagnosis Date   Anxiety    Coronary artery disease per pt last cardiology visit 2011 (found documentation 12-05-2009 w/ dr malva with [facility] in epic)/  currently pt is followed by pcp   12-28-2008  PCI and DES x2 to proximal and mid RCA/ last cardiac cath 12-09-2009  widely patent stents   GERD (gastroesophageal reflux disease)    Hyperlipidemia    Hypertension    Olecranon bursitis of left elbow    Respiratory failure (HCC)    S/P right coronary artery (RCA) stent placement 12/28/2008   PCI and DES x2-- proximal and mid RCA   Wears dentures    UPPER    Past Surgical History:  Procedure Laterality Date   CARDIAC CATHETERIZATION  12-09-2009  dr annis   abnormal stress test (12-05-2009) widely patent coronaries present w/ evidence of diffuse atherosclerotic disease/  widely patent stented segments in the proximal and mid segment RCA/  LVEF 65%   CORONARY ANGIOPLASTY WITH STENT PLACEMENT  12-28-2008  dr berry   PCI and stenting of the proxmial and mid RCA using cutting balloon atherectomy (x2 DES, Xience)/  LVEF >60% w/ normal wall motion   ESOPHAGOGASTRODUODENOSCOPY N/A 11/17/2019   Procedure: ESOPHAGOGASTRODUODENOSCOPY (EGD);  Surgeon: Rollin Dover, MD;  Location: [facility];  Service: Gastroenterology;  Laterality: N/A;   KNEE ARTHROSCOPY W/ MENISCECTOMY Right 2002   NEUROPLASTY / TRANSPOSITION ULNAR NERVE AT ELBOW Right 10/2014   dr shari CONNORS BURSECTOMY Left 05/06/2017   Procedure: LEFT ELBOW OLECRANON BURSA EXCISION;  Surgeon: shari Easter, MD;  Location: [facility] [place];  Service: Orthopedics;  Laterality: Left;   ORIF RIGHT ANKLE FX  2006   TONSILLECTOMY  age 50   TRANSTHORACIC ECHOCARDIOGRAM  03/30/2016   mild concentric LVH, ef 50-55%/  trivial TR    Social History    Socioeconomic History   Marital status: Married    Spouse name: Not on file   Number of children: Not on file   Years of education: Not on file   Highest education level: Not on file  Occupational History   Not on file  Tobacco Use   Smoking status: Some Days    Current packs/day: 0.00    Average packs/day: 2.0 packs/day for 42.1 years (84.2 ttl pk-yrs)    Types: Cigarettes    Start date: 39    Last attempt to quit: 11/17/2019    Years since quitting: 4.6   Smokeless tobacco: Never   Tobacco comments:    since age 37  Vaping Use   Vaping status: Never Used  Substance and Sexual Activity   Alcohol use: Yes    Alcohol/week: 1.0 standard drink of alcohol    Types: 1 Cans of beer per week    Comment: seldom   Drug use: No   Sexual activity: Yes    Birth control/protection: None  Other Topics Concern   Not on file  Social History Narrative   Not on file   Social Drivers of Corporate investment banker  Strain: Not on file  Food Insecurity: Not on file  Transportation Needs: Not on file  Physical Activity: Not on file  Stress: Not on file  Social Connections: Not on file  Intimate Partner Violence: Not on file    Family History  Problem Relation Age of Onset   Cancer Mother    Cancer Father     Current Facility-Administered Medications  Medication Dose Route Frequency Provider Last Rate Last Admin   droperidol  (INAPSINE ) 2.5 MG/ML injection 0.625 mg  0.625 mg Intravenous Once PRN Jerrye Sharper, MD       HYDROmorphone  (DILAUDID ) injection 0.25-0.5 mg  0.25-0.5 mg Intravenous Q5 min PRN Jerrye Sharper, MD       lactated ringers  infusion   Intravenous Continuous Corinne Garnette BRAVO, MD   Stopped at 06/28/24 1507   magnesium  citrate solution 1 Bottle  1 Bottle Oral Once Manny, Theodore B Jr., MD       ondansetron  (ZOFRAN ) injection 4 mg  4 mg Intravenous Once PRN Jerrye Sharper, MD       oxyCODONE  (Oxy IR/ROXICODONE ) immediate release tablet 5 mg  5 mg Oral Once PRN  Jerrye Sharper, MD       Or   oxyCODONE  (ROXICODONE ) 5 MG/5ML solution 5 mg  5 mg Oral Once PRN Jerrye Sharper, MD         Allergies  Allergen Reactions   Codeine Anaphylaxis and Nausea And Vomiting   Onion Anaphylaxis and Swelling    thoart swells up   Bee Venom Hives    ROS:   All other systems reviewed & are negative except per HPI or as noted below: Constitutional:  No fevers, chills, sweats.  Weight stable Eyes:  No vision changes, No discharge HENT:  No sore throats, nasal drainage Lymph: No neck swelling, No bruising easily Pulmonary:  No cough, productive sputum CV: No orthopnea, PND  Patient walks 20 minutes without difficulty.  No exertional chest/neck/shoulder/arm pain.  GI:  No personal nor family history of GI/colon cancer, inflammatory bowel disease, irritable bowel syndrome, allergy such as Celiac Sprue, dietary/dairy problems, colitis, ulcers nor gastritis.  No recent sick contacts/gastroenteritis.  No travel outside the country.  No changes in diet.  Renal: No UTIs, No hematuria Genital:  No drainage, bleeding, masses Musculoskeletal: No severe joint pain.  Good ROM major joints Skin:  No sores or lesions Heme/Lymph:  No easy bleeding.  No swollen lymph nodes   BP 126/87   Pulse 74   Temp 97.9 F (36.6 C) (Oral)   Resp 16   Wt 109.7 kg   SpO2 91%   BMI 33.73 kg/m   Physical Exam:  Constitutional: Not cachectic.  Hygeine adequate.  Vitals signs as above.  He is sedated with Eyes: Pupils reactive, normal extraocular movements. Sclera nonicteric Neuro: CN II-XII intact.  No major focal sensory defects.  No major motor deficits. Lymph: No head/neck/groin lymphadenopathy Psych:  No severe agitation.  No severe anxiety.  Judgment & insight Impaired, Oriented x2, HENT: Normocephalic, Mucus membranes moist.  No thrush.   Neck: Supple, No tracheal deviation.  No obvious thyromegaly CV:  Pulses intact.  regular rhythm. Tachycardia No major extremity  edema  Chest: Decreased breath sounds on the right side.  No pain to chest wall compression.  Fair respiratory excursion.  No audible wheezing   Abdomen:  Flat Hernia: Not present. Diastasis recti: Not present. Soft.   Nondistended.  Mildly tender at incisions only.  No hepatomegaly.  No splenomegaly  Gen:  Inguinal hernia: Not present.  Inguinal lymph nodes: without lymphadenopathy.   Rectal: (Deferred) Ext: No obvious deformity or contracture.  Edema: Not present.  No cyanosis Skin: No major subcutaneous nodules.  Warm and dry Musculoskeletal: Severe joint rigidity not present.  No obvious clubbing.  No digital petechiae.     Results:   Labs: Results for orders placed or performed during the hospital encounter of 06/28/24 (from the past 48 hours)  Hemoglobin and hematocrit, blood     Status: Abnormal   Collection Time: 06/28/24  3:21 PM  Result Value Ref Range   Hemoglobin 16.8 13.0 - 17.0 g/dL   HCT 45.4 (H) 60.9 - 47.9 %    Comment: Performed at [facility], [address], [place], [place] [address]    Imaging / Studies: X-ray chest PA or AP Result Date: 06/28/2024 CLINICAL DATA:  Postoperative complication. Shortness of breath and decreased oxygen saturation. EXAM: CHEST  1 VIEW COMPARISON:  Chest CT 04/13/2024 FINDINGS: Moderate right pneumothorax. There may be leftward mediastinal shift suggesting an element of tension. Subcutaneous emphysema in the right lateral chest wall. Heterogeneous bilateral lung opacities, likely related to underlying chronic lung disease. No significant pleural effusion. There is no left pneumothorax. The heart is normal in size. IMPRESSION: 1. Moderate right pneumothorax with possible leftward mediastinal shift suggesting an element of tension. 2. Heterogeneous bilateral lung opacities, likely related to underlying chronic lung disease. Critical Value/emergent results were called by telephone at the time of interpretation on 06/28/2024  at 3:51 pm to nurse Josette , who verbally acknowledged these results. Referring clinician is aware, the patient was receiving a chest tube when I called. Electronically Signed   By: Andrea Gasman M.D.   On: 06/28/2024 15:53    Medications / Allergies: per chart  Antibiotics: Anti-infectives (From admission, onward)    Start     Dose/Rate Route Frequency Ordered Stop   06/28/24 1002  ceFAZolin  (ANCEF ) IVPB 2g/100 mL premix        2 g 200 mL/hr over 30 Minutes Intravenous 30 min pre-op 06/28/24 1002 06/28/24 1239         Note: Portions of this report may have been transcribed using voice recognition software. Every effort was made to ensure accuracy; however, inadvertent computerized transcription errors may be present.   Any transcriptional errors that result from this process are unintentional.    Elspeth KYM Schultze, MD, FACS, MASCRS Esophageal, Gastrointestinal & Colorectal Surgery Robotic and Minimally Invasive Surgery  [facility] [address] [place], [place] [address] [phone] Fax [phone] Main  CONTACT INFORMATION: Weekday (9AM-5PM): Call [facility] main office at [phone] Weeknight (5PM-9AM) or Weekend/Holiday: Check EPIC Web Links tab & use AMION (password  [email or web address]) for General Surgery [facility] coverage  Please, DO NOT use SecureChat  (it is not reliable communication to reach operating surgeons & will lead to a delay in care).   Epic staff messaging available for outptient concerns needing 1-2 business day response.       06/28/2024  4:24 PM

## 2024-06-28 NOTE — Discharge Instructions (Addendum)
1- Drain Sites - You may have some mild persistent drainage from old drain site for several days, this is normal. This can be covered with cotton gauze for convenience.  2 - Stiches - Your stitches are all dissolvable. You may notice a "loose thread" at your incisions, these are normal and require no intervention. You may cut them flush to the skin with fingernail clippers if needed for comfort.  3 - Diet - No restrictions  4 - Activity - No heavy lifting / straining (any activities that require valsalva or "bearing down") x 4 weeks. Otherwise, no restrictions.  5 - Bathing - You may shower immediately. Do not take a bath or get into swimming pool where incision sites are submersed in water x 4 weeks.   6 -  When to Call the Doctor - Call MD for any fever >102, any acute wound problems, or any severe nausea / vomiting. You can call the [facility] Office [phone]) 24 hours a day 365 days a year. It will roll-over to the answering service and on-call physician after hours.  You may resume aspirin, advil, aleve, vitamins, and supplements 7 days after surgery.

## 2024-06-28 NOTE — H&P (Signed)
 Matthew [name] is an 58 y.o. male.    Chief Complaint: Pre-OP RIGHT robotic partial nephrectomy / cyst decortication  HPI:   1 - Large Right Complex Renal Cyst - 10cm Rt upper cyst with some wall thickening and ? debris (likely just blood) v. focal nodualrtity incidetnal on CT 12/2022. Despite large size, no mass effect per se. Approx stable compared to chest CT 2010. MRI ordered 12/2023 but he could not tolerate due to claustrophbia. Dedicated CT 02/2024 with some interval change compared to 2024 with size 12cm and some ? nodular enhacement along lateral wall.    PMH sig for lung nodule (follows [facility] Ramaswamy pulm), CAD/MI/Stent (follows Dr. 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 His PCP is with [facility]   Today Matthew [name] is seen to proceed with RIGHT robotic partial nephrectgomy for enalarging right cystic neoplasm. No interval fevers. Hgb 17, Cr 1.08 most recently.    Past Medical History:  Diagnosis Date   Anxiety    Coronary artery disease per pt last cardiology visit 2011 (found documentation 12-05-2009 w/ dr malva [facility] in epic)/  currently pt is followed by pcp   12-28-2008  PCI and DES x2 to proximal and mid RCA/ last cardiac cath 12-09-2009  widely patent stents   GERD (gastroesophageal reflux disease)    Hyperlipidemia    Hypertension    Olecranon bursitis of left elbow    Respiratory failure (HCC)    S/P right coronary artery (RCA) stent placement 12/28/2008   PCI and DES x2-- proximal and mid RCA   Wears dentures    UPPER    Past Surgical History:  Procedure Laterality Date   CARDIAC CATHETERIZATION  12-09-2009  dr annis   abnormal stress test (12-05-2009) widely patent coronaries present w/ evidence of diffuse atherosclerotic disease/  widely patent stented segments in the proximal and mid segment RCA/  LVEF 65%   CORONARY ANGIOPLASTY WITH STENT PLACEMENT  12-28-2008  dr berry   PCI and  stenting of the proxmial and mid RCA using cutting balloon atherectomy (x2 DES, Xience)/  LVEF >60% w/ normal wall motion   ESOPHAGOGASTRODUODENOSCOPY N/A 11/17/2019   Procedure: ESOPHAGOGASTRODUODENOSCOPY (EGD);  Surgeon: Rollin Dover, MD;  Location: [facility];  Service: Gastroenterology;  Laterality: N/A;   KNEE ARTHROSCOPY W/ MENISCECTOMY Right 2002   NEUROPLASTY / TRANSPOSITION ULNAR NERVE AT ELBOW Right 10/2014   dr shari CONNORS BURSECTOMY Left 05/06/2017   Procedure: LEFT ELBOW OLECRANON BURSA EXCISION;  Surgeon: shari Easter, MD;  Location: [facility] [place];  Service: Orthopedics;  Laterality: Left;   ORIF RIGHT ANKLE FX  2006   TONSILLECTOMY  age 76   TRANSTHORACIC ECHOCARDIOGRAM  03/30/2016   mild concentric LVH, ef 50-55%/  trivial TR    Family History  Problem Relation Age of Onset   Cancer Mother    Cancer Father    Social History:  reports that he has been smoking cigarettes. He started smoking about 46 years ago. He has a 84.2 pack-year smoking history. He has never used smokeless tobacco. He reports current alcohol use of about 1.0 standard drink of alcohol per week. He reports that he does not use drugs.  Allergies:  Allergies  Allergen Reactions   Codeine Anaphylaxis and Nausea And Vomiting   Onion Anaphylaxis and Swelling    thoart swells up   Bee Venom Hives    No medications prior to admission.  No results found for this or any previous visit (from the past 48 hours). No results found.  Review of Systems  Constitutional:  Negative for chills and fever.  All other systems reviewed and are negative.   There were no vitals taken for this visit. Physical Exam Vitals reviewed.  HENT:     Head: Normocephalic.  Eyes:     Pupils: Pupils are equal, round, and reactive to light.  Cardiovascular:     Rate and Rhythm: Normal rate.  Pulmonary:     Effort: Pulmonary effort is normal.  Abdominal:     General: Abdomen is flat.   Genitourinary:    Comments: No CVAT at present Musculoskeletal:        General: Normal range of motion.     Cervical back: Normal range of motion.  Skin:    General: Skin is warm.  Neurological:     General: No focal deficit present.     Mental Status: He is alert.  Psychiatric:        Mood and Affect: Mood normal.      Assessment/Plan  Proceed as planned with RIGHT robotic partial nephrectomy / cyst decortication for enalrging cystic mass. Risks, benefits, alternatives, expected peri-op course discussed previously and reiterated today.   [name]., MD 06/28/2024, 6:48 AM

## 2024-06-28 NOTE — Anesthesia Procedure Notes (Signed)
 Procedure Name: Intubation Date/Time: 06/28/2024 12:04 PM  Performed by: Lanning Cena RAMAN, CRNAPre-anesthesia Checklist: Patient identified, Emergency Drugs available, Suction available, Patient being monitored and Timeout performed Patient Re-evaluated:Patient Re-evaluated prior to induction Oxygen Delivery Method: Circle system utilized Preoxygenation: Pre-oxygenation with 100% oxygen Induction Type: IV induction Ventilation: Oral airway inserted - appropriate to patient size and Two handed mask ventilation required Laryngoscope Size: Glidescope and 3 Grade View: Grade I Tube type: Oral Tube size: 7.5 mm Number of attempts: 1 Airway Equipment and Method: Rigid stylet and Video-laryngoscopy Placement Confirmation: ETT inserted through vocal cords under direct vision, positive ETCO2, CO2 detector and breath sounds checked- equal and bilateral Secured at: 22 cm Tube secured with: Tape Dental Injury: Teeth and Oropharynx as per pre-operative assessment

## 2024-06-29 ENCOUNTER — Encounter (HOSPITAL_COMMUNITY): Payer: Self-pay | Admitting: Urology

## 2024-06-29 ENCOUNTER — Other Ambulatory Visit: Payer: Self-pay

## 2024-06-29 ENCOUNTER — Observation Stay (HOSPITAL_COMMUNITY)

## 2024-06-29 DIAGNOSIS — F17210 Nicotine dependence, cigarettes, uncomplicated: Secondary | ICD-10-CM | POA: Diagnosis not present

## 2024-06-29 DIAGNOSIS — D72829 Elevated white blood cell count, unspecified: Secondary | ICD-10-CM | POA: Diagnosis not present

## 2024-06-29 DIAGNOSIS — J439 Emphysema, unspecified: Secondary | ICD-10-CM | POA: Diagnosis not present

## 2024-06-29 DIAGNOSIS — I2510 Atherosclerotic heart disease of native coronary artery without angina pectoris: Secondary | ICD-10-CM | POA: Diagnosis not present

## 2024-06-29 DIAGNOSIS — E785 Hyperlipidemia, unspecified: Secondary | ICD-10-CM | POA: Diagnosis not present

## 2024-06-29 DIAGNOSIS — E875 Hyperkalemia: Secondary | ICD-10-CM | POA: Diagnosis present

## 2024-06-29 DIAGNOSIS — F419 Anxiety disorder, unspecified: Secondary | ICD-10-CM | POA: Diagnosis not present

## 2024-06-29 DIAGNOSIS — Z5329 Procedure and treatment not carried out because of patient's decision for other reasons: Secondary | ICD-10-CM | POA: Diagnosis not present

## 2024-06-29 DIAGNOSIS — Z7982 Long term (current) use of aspirin: Secondary | ICD-10-CM | POA: Diagnosis not present

## 2024-06-29 DIAGNOSIS — Y838 Other surgical procedures as the cause of abnormal reaction of the patient, or of later complication, without mention of misadventure at the time of the procedure: Secondary | ICD-10-CM | POA: Diagnosis not present

## 2024-06-29 DIAGNOSIS — N281 Cyst of kidney, acquired: Secondary | ICD-10-CM | POA: Diagnosis not present

## 2024-06-29 DIAGNOSIS — S27803A Laceration of diaphragm, initial encounter: Secondary | ICD-10-CM | POA: Diagnosis not present

## 2024-06-29 DIAGNOSIS — K219 Gastro-esophageal reflux disease without esophagitis: Secondary | ICD-10-CM | POA: Diagnosis not present

## 2024-06-29 DIAGNOSIS — R58 Hemorrhage, not elsewhere classified: Secondary | ICD-10-CM | POA: Diagnosis not present

## 2024-06-29 DIAGNOSIS — J95811 Postprocedural pneumothorax: Secondary | ICD-10-CM | POA: Diagnosis not present

## 2024-06-29 DIAGNOSIS — J9601 Acute respiratory failure with hypoxia: Secondary | ICD-10-CM | POA: Diagnosis not present

## 2024-06-29 DIAGNOSIS — Z1152 Encounter for screening for COVID-19: Secondary | ICD-10-CM | POA: Diagnosis not present

## 2024-06-29 DIAGNOSIS — D4101 Neoplasm of uncertain behavior of right kidney: Secondary | ICD-10-CM | POA: Diagnosis present

## 2024-06-29 DIAGNOSIS — Z4682 Encounter for fitting and adjustment of non-vascular catheter: Secondary | ICD-10-CM | POA: Diagnosis not present

## 2024-06-29 DIAGNOSIS — Z6833 Body mass index (BMI) 33.0-33.9, adult: Secondary | ICD-10-CM | POA: Diagnosis not present

## 2024-06-29 DIAGNOSIS — J939 Pneumothorax, unspecified: Secondary | ICD-10-CM | POA: Diagnosis not present

## 2024-06-29 DIAGNOSIS — Z8616 Personal history of COVID-19: Secondary | ICD-10-CM | POA: Diagnosis not present

## 2024-06-29 DIAGNOSIS — R0989 Other specified symptoms and signs involving the circulatory and respiratory systems: Secondary | ICD-10-CM | POA: Diagnosis not present

## 2024-06-29 DIAGNOSIS — R918 Other nonspecific abnormal finding of lung field: Secondary | ICD-10-CM | POA: Diagnosis not present

## 2024-06-29 DIAGNOSIS — I509 Heart failure, unspecified: Secondary | ICD-10-CM | POA: Diagnosis not present

## 2024-06-29 DIAGNOSIS — Z955 Presence of coronary angioplasty implant and graft: Secondary | ICD-10-CM | POA: Diagnosis not present

## 2024-06-29 DIAGNOSIS — E66811 Obesity, class 1: Secondary | ICD-10-CM | POA: Diagnosis not present

## 2024-06-29 DIAGNOSIS — C641 Malignant neoplasm of right kidney, except renal pelvis: Secondary | ICD-10-CM | POA: Diagnosis not present

## 2024-06-29 DIAGNOSIS — U099 Post covid-19 condition, unspecified: Secondary | ICD-10-CM | POA: Diagnosis not present

## 2024-06-29 DIAGNOSIS — E871 Hypo-osmolality and hyponatremia: Secondary | ICD-10-CM | POA: Diagnosis not present

## 2024-06-29 DIAGNOSIS — J9690 Respiratory failure, unspecified, unspecified whether with hypoxia or hypercapnia: Secondary | ICD-10-CM | POA: Diagnosis not present

## 2024-06-29 DIAGNOSIS — J8410 Pulmonary fibrosis, unspecified: Secondary | ICD-10-CM | POA: Diagnosis not present

## 2024-06-29 DIAGNOSIS — E8809 Other disorders of plasma-protein metabolism, not elsewhere classified: Secondary | ICD-10-CM | POA: Diagnosis not present

## 2024-06-29 DIAGNOSIS — T797XXA Traumatic subcutaneous emphysema, initial encounter: Secondary | ICD-10-CM | POA: Diagnosis not present

## 2024-06-29 DIAGNOSIS — Z720 Tobacco use: Secondary | ICD-10-CM | POA: Diagnosis not present

## 2024-06-29 DIAGNOSIS — Z885 Allergy status to narcotic agent status: Secondary | ICD-10-CM | POA: Diagnosis not present

## 2024-06-29 DIAGNOSIS — I10 Essential (primary) hypertension: Secondary | ICD-10-CM | POA: Diagnosis not present

## 2024-06-29 LAB — BASIC METABOLIC PANEL WITH GFR
Anion gap: 11 (ref 5–15)
BUN: 12 mg/dL (ref 6–20)
CO2: 24 mmol/L (ref 22–32)
Calcium: 8.7 mg/dL — ABNORMAL LOW (ref 8.9–10.3)
Chloride: 97 mmol/L — ABNORMAL LOW (ref 98–111)
Creatinine, Ser: 1.13 mg/dL (ref 0.61–1.24)
GFR, Estimated: >60 mL/min
Glucose, Bld: 151 mg/dL — ABNORMAL HIGH (ref 70–99)
Potassium: 5.2 mmol/L — ABNORMAL HIGH (ref 3.5–5.1)
Sodium: 133 mmol/L — ABNORMAL LOW (ref 135–145)

## 2024-06-29 LAB — MRSA NEXT GEN BY PCR, NASAL: MRSA by PCR Next Gen: DETECTED — AB

## 2024-06-29 LAB — HEMOGLOBIN AND HEMATOCRIT, BLOOD
HCT: 53.7 % — ABNORMAL HIGH (ref 39.0–52.0)
Hemoglobin: 17.3 g/dL — ABNORMAL HIGH (ref 13.0–17.0)

## 2024-06-29 LAB — CREATININE, FLUID (PLEURAL, PERITONEAL, JP DRAINAGE): Creat, Fluid: 1 mg/dL

## 2024-06-29 MED ORDER — CHLORHEXIDINE GLUCONATE CLOTH 2 % EX PADS
6.0000 | MEDICATED_PAD | Freq: Every day | CUTANEOUS | Status: DC
  Administered 2024-06-29: 6 via TOPICAL

## 2024-06-29 MED ORDER — CHLORHEXIDINE GLUCONATE CLOTH 2 % EX PADS: 6.0000 | MEDICATED_PAD | Freq: Every day | CUTANEOUS | Status: DC

## 2024-06-29 MED ORDER — SODIUM ZIRCONIUM CYCLOSILICATE 10 G PO PACK
10.0000 g | PACK | Freq: Once | ORAL | Status: AC
  Administered 2024-06-29: 10 g via ORAL
  Filled 2024-06-29: qty 1

## 2024-06-29 MED ORDER — SENNA 8.6 MG PO TABS
2.0000 | ORAL_TABLET | Freq: Every day | ORAL | Status: DC
  Administered 2024-06-30 – 2024-07-02 (×3): 17.2 mg via ORAL
  Filled 2024-06-29 (×4): qty 2

## 2024-06-29 MED ORDER — PROCHLORPERAZINE EDISYLATE 10 MG/2ML IJ SOLN
10.0000 mg | Freq: Four times a day (QID) | INTRAMUSCULAR | Status: DC | PRN
  Administered 2024-06-29: 10 mg via INTRAVENOUS
  Filled 2024-06-29 (×2): qty 2

## 2024-06-29 MED ORDER — HEPARIN SODIUM (PORCINE) 5000 UNIT/ML IJ SOLN
5000.0000 [IU] | Freq: Three times a day (TID) | INTRAMUSCULAR | Status: DC
  Administered 2024-06-29 – 2024-07-03 (×12): 5000 [IU] via SUBCUTANEOUS
  Filled 2024-06-29 (×12): qty 1

## 2024-06-29 MED ORDER — ALUM & MAG HYDROXIDE-SIMETH 200-200-20 MG/5ML PO SUSP
30.0000 mL | ORAL | Status: DC | PRN
  Filled 2024-06-29: qty 30

## 2024-06-29 MED ORDER — MUPIROCIN 2 % EX OINT
1.0000 | TOPICAL_OINTMENT | Freq: Two times a day (BID) | CUTANEOUS | Status: DC
Start: 2024-06-29 — End: 2024-07-03
  Administered 2024-06-29 – 2024-07-02 (×7): 1 via NASAL
  Filled 2024-06-29 (×3): qty 22

## 2024-06-29 MED ORDER — FAMOTIDINE 20 MG PO TABS
20.0000 mg | ORAL_TABLET | Freq: Two times a day (BID) | ORAL | Status: AC
  Administered 2024-06-29 – 2024-06-30 (×4): 20 mg via ORAL
  Filled 2024-06-29 (×4): qty 1

## 2024-06-29 NOTE — Progress Notes (Signed)
 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   Subjective: Doing well this morning. Up in bed eating breakfast. CT placed to water  seal this AM. No N/V. Ambulated unit last night. Pain well controlled. Drain with sanguineous appearing output, charted as 315ccs since OR. Elevated K on BMP, no abnormalities on tele.   Objective: Vital signs in last 24 hours: Temp:  [94.6 F (34.8 C)-98.3 F (36.8 C)] 97.4 F (36.3 C) (09/18 0800) Pulse Rate:  [60-95] 80 (09/18 0700) Resp:  [15-32] 24 (09/18 0700) BP: (114-164)/(59-103) 145/90 (09/18 0700) SpO2:  [86 %-96 %] 95 % (09/18 0700) Weight:  [109.7 kg] 109.7 kg (09/17 1042)  Intake/Output from previous day: 09/17 0701 - 09/18 0700 In: 3343.8 [P.O.:480; I.V.:2413.8; IV Piggyback:450] Out: 1141 [Urine:675; Drains:315; Blood:150; Chest Tube:1] Intake/Output this shift: Total I/O In: -  Out: 25 [Drains:25]  Physical Exam:  General: Alert and oriented. CV: Regular rate  Pulm: Chest tube in place, on water  seal. No increased WOB.  GI: Soft, Nondistended. Drain with s/s output.  Incisions: Clean and dry. Urine: Foley in place  Extremities: Nontender, no erythema, no edema.  Lab Results: Recent Labs    06/28/24 1521 06/29/24 0309  HGB 16.8 17.3*  HCT 54.5* 53.7*          Recent Labs    06/29/24 0309  CREATININE 1.13           Results for orders placed or performed during the hospital encounter of 06/28/24 (from the past 24 hours)  Hemoglobin and hematocrit, blood     Status: Abnormal   Collection Time: 06/28/24  3:21 PM  Result Value Ref Range   Hemoglobin 16.8 13.0 - 17.0 g/dL   HCT 45.4 (H) 60.9 - 47.9 %  MRSA Next Gen by PCR, Nasal     Status: Abnormal   Collection Time: 06/28/24 10:30 PM   Specimen: Nasal Mucosa; Nasal Swab  Result Value Ref Range   MRSA by PCR Next Gen DETECTED (A) NOT DETECTED  Basic metabolic  panel     Status: Abnormal   Collection Time: 06/29/24  3:09 AM  Result Value Ref Range   Sodium 133 (L) 135 - 145 mmol/L   Potassium 5.2 (H) 3.5 - 5.1 mmol/L   Chloride 97 (L) 98 - 111 mmol/L   CO2 24 22 - 32 mmol/L   Glucose, Bld 151 (H) 70 - 99 mg/dL   BUN 12 6 - 20 mg/dL   Creatinine, Ser 8.86 0.61 - 1.24 mg/dL   Calcium  8.7 (L) 8.9 - 10.3 mg/dL   GFR, Estimated >39 >39 mL/min   Anion gap 11 5 - 15  Hemoglobin and hematocrit, blood     Status: Abnormal   Collection Time: 06/29/24  3:09 AM  Result Value Ref Range   Hemoglobin 17.3 (H) 13.0 - 17.0 g/dL   HCT 46.2 (H) 60.9 - 47.9 %    Assessment/Plan: POD# 1 s/p robotic R partial nephrectomy 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  - Chest tube per general surgery, appreciate their assistance  - Ambulate, Incentive spirometry - Continue IVF in setting of borderline Uop, encourage PO intake  - Transition to oral pain medication - Bowel regimen  - D/C urethral catheter - Medicine consult in setting of electrolyte abnormalities on labs this AM    LOS: 0 days   [employer] 06/29/2024, 9:07 AM

## 2024-06-29 NOTE — Progress Notes (Signed)
 1 Day Post-Op  Subjective: Patient with no issues this am.  No pain, except minimal around R chest tube.  Denies SOB, O2 in place and sats around 94%.  ROS: See above, otherwise other systems negative  Objective: Vital signs in last 24 hours: Temp:  [94.6 F (34.8 C)-98.3 F (36.8 C)] 98.3 F (36.8 C) (09/18 0400) Pulse Rate:  [60-95] 80 (09/18 0700) Resp:  [15-32] 24 (09/18 0700) BP: (114-164)/(59-103) 145/90 (09/18 0700) SpO2:  [86 %-96 %] 95 % (09/18 0700) Weight:  [109.7 kg] 109.7 kg (09/17 1042) Last BM Date : 06/28/24 (PTA, per pt)  Intake/Output from previous day: 09/17 0701 - 09/18 0700 In: 3343.8 [P.O.:480; I.V.:2413.8; IV Piggyback:450] Out: 1141 [Urine:675; Drains:315; Blood:150; Chest Tube:1] Intake/Output this shift: Total I/O In: -  Out: 25 [Drains:25]  PE: Gen: NAD Lungs: CTAB, R chest tube in place with no air leak with deep inspiration or cough.  No output.    Lab Results:  Recent Labs    06/28/24 1521 06/29/24 0309  HGB 16.8 17.3*  HCT 54.5* 53.7*   BMET Recent Labs    06/29/24 0309  NA 133*  K 5.2*  CL 97*  CO2 24  GLUCOSE 151*  BUN 12  CREATININE 1.13  CALCIUM  8.7*   PT/INR No results for input(s): LABPROT, INR in the last 72 hours. CMP     Component Value Date/Time   NA 133 (L) 06/29/2024 0309   NA 138 09/15/2022 1411   K 5.2 (H) 06/29/2024 0309   CL 97 (L) 06/29/2024 0309   CO2 24 06/29/2024 0309   GLUCOSE 151 (H) 06/29/2024 0309   BUN 12 06/29/2024 0309   BUN 13 09/15/2022 1411   CREATININE 1.13 06/29/2024 0309   CALCIUM  8.7 (L) 06/29/2024 0309   PROT 7.8 05/23/2024 1244   ALBUMIN  4.2 05/23/2024 1244   AST 19 05/23/2024 1244   ALT 17 05/23/2024 1244   ALKPHOS 132 (H) 05/23/2024 1244   BILITOT 0.5 05/23/2024 1244   GFRNONAA >60 06/29/2024 0309   GFRAA >60 11/20/2019 0311   Lipase  No results found for: LIPASE     Studies/Results: X-ray chest PA or AP Result Date: 06/28/2024 CLINICAL DATA:  [id number]  Chest tube in place [id number] EXAM: CHEST  1 VIEW COMPARISON:  06/28/2024 3:24 p.m. FINDINGS: Small bore right-sided pigtail thoracostomy tube overlying the right infrahilar region. Low lung volumes with interval re-expansion of the right lung and resolution of the right pneumothorax. Underlying chronic lung disease changes. Patchy airspace opacities in the right lung base. Mild cardiomegaly. Tortuous aorta with aortic atherosclerosis. No acute fracture or destructive lesions. Multilevel thoracic osteophytosis. IMPRESSION: 1. Interval placement of a pigtail thoracostomy tube on the right with resolution of the right-sided pneumothorax. 2. Underlying chronic lung disease is similar with patchy opacities in the right lung base, probably atelectasis. Electronically Signed   By: Rogelia Myers M.D.   On: 06/28/2024 16:35   X-ray chest PA or AP Result Date: 06/28/2024 CLINICAL DATA:  Postoperative complication. Shortness of breath and decreased oxygen saturation. EXAM: CHEST  1 VIEW COMPARISON:  Chest CT 04/13/2024 FINDINGS: Moderate right pneumothorax. There may be leftward mediastinal shift suggesting an element of tension. Subcutaneous emphysema in the right lateral chest wall. Heterogeneous bilateral lung opacities, likely related to underlying chronic lung disease. No significant pleural effusion. There is no left pneumothorax. The heart is normal in size. IMPRESSION: 1. Moderate right pneumothorax with possible leftward mediastinal shift suggesting an element of tension.  2. Heterogeneous bilateral lung opacities, likely related to underlying chronic lung disease. Critical Value/emergent results were called by telephone at the time of interpretation on 06/28/2024 at 3:51 pm to nurse Josette , who verbally acknowledged these results. Referring clinician is aware, the patient was receiving a chest tube when I called. Electronically Signed   By: Andrea Gasman M.D.   On: 06/28/2024 15:53     Anti-infectives: Anti-infectives (From admission, onward)    Start     Dose/Rate Route Frequency Ordered Stop   06/28/24 1002  ceFAZolin  (ANCEF ) IVPB 2g/100 mL premix        2 g 200 mL/hr over 30 Minutes Intravenous 30 min pre-op 06/28/24 1002 06/28/24 1239        Assessment/Plan R PTX, s/p robotic assisted laparoscopic right partial nephrectomy -CT placed in PACU -CXR reviewed this am personally, not read yet, but no obvious residual PTX noted.  CT in place -no airleak or significant output -place CT to waterseal.  Recheck CXR in am.  If this remains ok, then will pull CT and recheck CXR 4 hrs after pull -discussed this timeline with the patient and also with the primary service.  Also discussed plan with nursing in the hall  FEN - regular VTE - SCDs ID - none currently  I reviewed Consultant urology notes, last 24 h vitals and pain scores, last 48 h intake and output, last 24 h labs and trends, and last 24 h imaging results.   LOS: 0 days    Burnard FORBES Banter , [facility] 06/29/2024, 8:01 AM Please see Amion for pager number during day hours 7:00am-4:30pm or 7:00am -11:30am on weekends

## 2024-06-29 NOTE — Op Note (Signed)
 NAME: Matthew [name], Matthew [name] [name]: [id number] ACCOUNT NO: [id number] DATE OF BIRTH: 1966/01/22 FACILITY: [facility] LOCATION: [id number] PHYSICIAN: Ricardo Likens, MD  Operative Report   DATE OF PROCEDURE: 06/28/2024  SURGEON: Ricardo Likens, MD  PREOPERATIVE DIAGNOSIS:  Enlarging right cystic renal mass.  PROCEDURE PERFORMED:  Robotic assisted laparoscopic right partial nephrectomy.  ESTIMATED BLOOD LOSS:  150 mL.  COMPLICATIONS: None.  SPECIMENS: 1.  Cyst wall, frozen section:  Negative for overt carcinoma. 2.  Clot versus tumor:  Negative for overt carcinoma. 3.  Right partial nephrectomy  DRAINS: 1.  Leonce Birk drain to bulb suction. 2.  Foley catheter to straight drain.  ASSISTANT:  Alan Hammonds, PA.  FINDINGS: 1.  Very large right upper pole cystic mass with evidence of prior internal hemorrhage. 2.  Some nodularity of the upper portion of the cyst most likely consistent with focal tumor within the cyst as anticipated but grossly negative margin of the area of cyst wall. 3.  One artery, one vein right renal vascular anatomy.  INDICATIONS:  The patient is a 58 year old male with a longstanding history of a known apparently large right upper pole cyst with variable surveillance.  He was noted most recently to have significant enlargement of the cyst up to 12 cm.  This is  clearly progressive with more mass effect on the kidney. At his most recent imaging, he was also noted to have some nodular component to the superior portion worrisome for possible neoplastic degeneration of a portion of the cyst. Options were discussed  for management including continuous surveillance versus surgery with partial versus radical nephrectomy, and we agreed upon surgery for curative intent. Informed consent was obtained and placed in the medical record.  DESCRIPTION OF PROCEDURE:  The patient being [name] verified and the procedure being right robotic partial nephrectomy was  confirmed.  Procedure timeout was performed.  Intervenous antibiotics administered.  General endotracheal anesthesia  induced.  A Foley catheter had already been placed via straight drain.  The patient was placed into a right side up, full flank position, applying 15 degrees of table flexion, superior arm elevator, and axillary roll.  Sequential compression devices, the  bottom leg bent, top leg straight.  He was further fastened to the operative table using 3 inch tape for foam padding across his supraxiphoid chest and his pelvis.  Sterile field was created by prepping and draping the patient's right flank and abdomen  using chlorhexidine  gluconate and a high flow, low pressure pneumoperitoneum was obtained using Veress technique in the right lower quadrant after passing the aspiration and drop test.  An 8-mm robotic camera port was then placed in position approximately two handbreadths superolateral to the umbilicus.  Laparoscopic examination of the peritoneal cavity noted no adhesions and no visceral injury.  Additional ports were placed as follows:   Right subcostal 8-mm robotic port, right far lateral 8-mm robotic port, approximately one handbreadth superomedial to the anterior superior iliac spine, right paramedian inferior lateral port, approximately one handbreadth superior to the pubic ramus,  two 12-mm assistant port sites in the paramedian location, one approximately three fingerbreadths inferomedial to the camera port and one approximately three fingerbreadths superomedial to the plane of the camera port, the superior one being an AirSeal  type.  Finally, a 5-mm port in the subxiphoid location through which a self-locking grasper was used to elevate the liver edge away from the anterior surface of Gerota's fascia.  The robot was docked and passed electronic checks.  Attention was directed to the development of the retroperitoneum. An incision was made along the white line of Toldt lateral to  the ascending colon from the cecum up to and posterior to the hepatic flexure. This was carefully mobilized medially. The  duodenum was encountered and also kocherized medially such that its lateral edge lay medial to the lateral edge of the inferior vena cava.  The large cyst at the upper pole was immediately noted with significant downward mass effect on the kidney from  this.  The lower pole of the kidney was identified and placed in gentle lateral traction.  Dissection was proceeded with this.  The ureter and gonadal vessels were encountered. The gonadal vessels were allowed to fall medially, and the ureter was swept  laterally. The psoas musculature was then identified, and the lower pole of the kidney and ureter were placed in gentle lateral traction. Dissection proceeded within this triangle towards the renal hilum.  Renal hilum consisted of a single artery, single  vein renal vascular anatomy as anticipated.  The artery was circumferentially mobilized with a vessel loop and marked for possible clamping later.  Attention was then directed at further mobilization of the large upper pole cyst. Circumferential mobilization was performed, and there was some desmoplasia around this significantly.  This was performed maximally, especially at the medial planes,  superior planes, and lateral planes.  The border of the cyst versus true parenchyma was somewhat indistinct. Therefore, a purposeful small incision was made into the cyst.  It was drained of 600 mL of chocolate-colored fluid, which was consistent with  internal hemorrhage. This was further incised to better denote the true border of the renal parenchyma versus the cyst wall, which allowed better visualization of the internal aspect of the cyst.  There was a large amount of debris within the cyst that  grossly I was very concerned may represent necrotic tumor versus just clot from prior bleeding.  Frozen section of this material was set aside and  sent and was negative for overt carcinoma, favoring more clot, old clot. This was very encouraging.  A  sample of the cyst wall area that was adjacent to the renal parenchyma was also set aside for frozen section and was negative for overt carcinoma.  Given these stable findings, we proceeded with partial nephrectomy, excising the cyst wall essentially flush with the normal renal parenchyma such that the nodular component, which I was more concerned for neoplasm, was completely resected with the  superior aspect of the cyst wall, and grossly, we achieved a negative margin on this.  There were no obvious entrances into the collecting system.  The large cyst with the upper pole nodular component had been completely removed.  This was placed in a  large EndoCatch bag for later retrieval. Sponge and needle counts were correct.  Hemostasis was excellent.  The vessel loop on the artery was removed.  A closed-suction drain was brought through the previous lateral most port site inferior to the  peritoneal cavity.  The robot was then undocked.  The specimen was retrieved by dissecting the previous assistant port sites, 12 mm ones in the paramedial location, removing the right partial nephrectomy specimen, setting it aside for permanent  pathology.  The extraction site was closed in layers using figure-of-eight PDS x 6 followed by reapproximation of Scarpa's with running Vicryl.  All incision sites were infiltrated with dilute lipolyzed Marcaine  and closed at the level of the skin using  subcuticular Monocryl followed by  Dermabond.  The procedure was then terminated.  The patient tolerated the procedure well.  No immediate periprocedural complications.  The patient was taken to post anesthesia care unit in stable condition.  Please note, first assistant Alan Hammonds was crucial to all portions of surgery today.  She provided invaluable retraction, suctioning, vascular looping,  robotic instrument exchange, and  general first assistance.    [facility] D: 06/28/2024 2:56:31 pm T: 06/29/2024 12:10:00 am  JOB: [id number]/ [id number]

## 2024-06-29 NOTE — Plan of Care (Signed)

## 2024-06-29 NOTE — Consult Note (Signed)
 Initial Consultation Note   Patient: Matthew [name] FMW:[id number] DOB: 01-09-1966 PCP: Pcp, No DOA: 06/28/2024 DOS: the patient was seen and examined on 06/29/2024 Primary service: Alvaro Ricardo KATHEE Mickey., MD  Referring physician: Alvaro Ricardo KATHEE Mickey., MD Reason for consult: Hyperkalemia.  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 Continue postop management per primary team.  Active Problems:   Hyperkalemia  Lokelma  10 g p.o. x 1 dose. Continue IV fluids. Follow-up potassium level.    Pneumothorax on right Continue with chest tube management by CCM.    Pulmonary emphysema, unspecified emphysema type (HCC) Continue bronchodilators as needed.    Lung nodule Follow-up with pulmonary medicine as an outpatient.    Tobacco abuse Tobacco cessation advised. Nicotine replacement therapy may be ordered as needed.    CAD (coronary artery disease) Resume aspirin  once cleared urology.    Essential hypertension Monitor blood pressure. Pain control. Antihypertensives as needed.    Hyperlipidemia Follow-up with primary care provider. Might benefit from statin therapy given Hx of CAD.    Obesity (BMI 30.0-34.9) Current BMI 33.73 kg/m. Would benefit from lifestyle modifications. Follow-up closely with PCP.    TRH will continue to follow the patient.  HPI: Matthew [name] is a 58 y.o. male with past medical history of anxiety, CAD, history of RCA stent placement, GERD, hyperlipidemia, class I obesity, hypertension, left elbow bursitis, emphysema, history of respiratory failure, tobacco abuse who underwent a robotically assisted partial nephrectomy due to large right renal cystic mass who developed hypoxia while in PACU with an x-ray showing a right pneumothorax.  We are being consulted for evaluation of hyperkalemia.  He stated that recently he has been drinking a lot of fruit and juices. He denied fever, chills, rhinorrhea,  sore throat, wheezing or hemoptysis.  No chest pain, palpitations, diaphoresis, PND, orthopnea or pitting edema of the lower extremities.  No nausea, emesis, diarrhea, constipation, melena or hematochezia.  No flank pain, dysuria, frequency or hematuria.  No polyuria, polydipsia, polyphagia or blurred vision.   Review of Systems: As mentioned in the history of present illness. All other systems reviewed and are negative. Past Medical History:  Diagnosis Date   Anxiety    Coronary artery disease per pt last cardiology visit 2011 (found documentation 12-05-2009 w/ dr malva with [facility] in epic)/  currently pt is followed by pcp   12-28-2008  PCI and DES x2 to proximal and mid RCA/ last cardiac cath 12-09-2009  widely patent stents   GERD (gastroesophageal reflux disease)    Hyperlipidemia    Hypertension    Olecranon bursitis of left elbow    Respiratory failure (HCC)    S/P right coronary artery (RCA) stent placement 12/28/2008   PCI and DES x2-- proximal and mid RCA   Wears dentures    UPPER   Past Surgical History:  Procedure Laterality Date   CARDIAC CATHETERIZATION  12-09-2009  dr annis   abnormal stress test (12-05-2009) widely patent coronaries present w/ evidence of diffuse atherosclerotic disease/  widely patent stented segments in the proximal and mid segment RCA/  LVEF 65%   CORONARY ANGIOPLASTY WITH STENT PLACEMENT  12-28-2008  dr berry   PCI and stenting of the proxmial and mid RCA using cutting balloon atherectomy (x2 DES, Xience)/  LVEF >60% w/ normal wall motion   ESOPHAGOGASTRODUODENOSCOPY N/A 11/17/2019   Procedure: ESOPHAGOGASTRODUODENOSCOPY (EGD);  Surgeon: Rollin Dover, MD;  Location: [facility];  Service: Gastroenterology;  Laterality: N/A;  KNEE ARTHROSCOPY W/ MENISCECTOMY Right 2002   NEUROPLASTY / TRANSPOSITION ULNAR NERVE AT ELBOW Right 10/2014   dr shari CONNORS BURSECTOMY Left 05/06/2017   Procedure: LEFT ELBOW OLECRANON BURSA EXCISION;  Surgeon:  shari Easter, MD;  Location: [facility] [place];  Service: Orthopedics;  Laterality: Left;   ORIF RIGHT ANKLE FX  2006   ROBOTIC ASSITED PARTIAL NEPHRECTOMY Right 06/28/2024   Procedure: NEPHRECTOMY, PARTIAL, ROBOT-ASSISTED;  Surgeon: Alvaro Ricardo KATHEE Mickey., MD;  Location: [facility];  Service: Urology;  Laterality: Right;   TONSILLECTOMY  age 20   TRANSTHORACIC ECHOCARDIOGRAM  03/30/2016   mild concentric LVH, ef 50-55%/  trivial TR   Social History:  reports that he has been smoking cigarettes. He started smoking about 46 years ago. He has a 84.2 pack-year smoking history. He has never used smokeless tobacco. He reports current alcohol use of about 1.0 standard drink of alcohol per week. He reports that he does not use drugs.  Allergies  Allergen Reactions   Codeine Anaphylaxis and Nausea And Vomiting   Onion Anaphylaxis and Swelling    thoart swells up   Bee Venom Hives    Family History  Problem Relation Age of Onset   Cancer Mother    Cancer Father     Prior to Admission medications   Medication Sig Start Date End Date Taking? Authorizing Provider  aspirin  EC 81 MG tablet Take 81 mg by mouth daily.   Yes [provider]  docusate sodium  (COLACE) 100 MG capsule Take 1 capsule (100 mg total) by mouth 2 (two) times daily. 06/28/24  Yes Dancy, Alan, PA-C  loratadine-pseudoephedrine (CLARITIN-D 24-HOUR) 10-240 MG 24 hr tablet Take 1 tablet by mouth daily.   Yes [provider]  predniSONE  (DELTASONE ) 10 MG tablet Take 10 mg by mouth as directed.   Yes [provider]  SPIRIVA  RESPIMAT 2.5 MCG/ACT AERS INHALE 2 PUFFS DAILY 02/17/24  Yes Geronimo Amel, MD  traMADol  (ULTRAM ) 50 MG tablet Take 1-2 tablets (50-100 mg total) by mouth every 6 (six) hours as needed for moderate pain (pain score 4-6) or severe pain (pain score 7-10). 06/28/24  Yes Dancy, Alan, PA-C  vitamin B-12 (CYANOCOBALAMIN) 1000 MCG tablet Take 1,000 mcg by mouth 3 (three) times a week.    Yes [provider]  albuterol  (VENTOLIN  HFA) 108 (90 Base) MCG/ACT inhaler INHALE 2 PUFFS INTO THE LUNGS EVERY SIX HOURS AS NEEDED FOR WHEEZING OR SHORTNESS OF BREATH. 11/15/20   Mikhail, Maryann, DO    Physical Exam: Vitals:   06/29/24 0500 06/29/24 0600 06/29/24 0700 06/29/24 0800  BP: (!) 146/91 (!) 153/86 (!) 145/90   Pulse: 76 77 80   Resp: (!) 24 (!) 24 (!) 24   Temp:    (!) 97.4 F (36.3 C)  TempSrc:    Oral  SpO2: 92% 92% 95%   Weight:      Height:       Physical Exam Vitals reviewed.  Constitutional:      General: He is awake. He is not in acute distress.    Appearance: He is obese. He is ill-appearing.  HENT:     Head: Normocephalic.     Nose: No rhinorrhea.     Mouth/Throat:     Mouth: Mucous membranes are moist.  Eyes:     General: No scleral icterus.    Pupils: Pupils are equal, round, and reactive to light.  Neck:     Vascular: No JVD.  Cardiovascular:  Rate and Rhythm: Normal rate and regular rhythm.     Heart sounds: S1 normal and S2 normal.  Pulmonary:     Effort: Pulmonary effort is normal.     Breath sounds: Normal breath sounds. No wheezing, rhonchi or rales.  Abdominal:     General: Bowel sounds are normal. There is no distension.     Palpations: Abdomen is soft.     Tenderness: There is no abdominal tenderness. There is no right CVA tenderness or left CVA tenderness.  Musculoskeletal:     Cervical back: Neck supple.     Right lower leg: No edema.     Left lower leg: No edema.  Skin:    General: Skin is warm and dry.  Neurological:     General: No focal deficit present.     Mental Status: He is alert and oriented to person, place, and time.  Psychiatric:        Mood and Affect: Mood normal.        Behavior: Behavior normal. Behavior is cooperative.     Data Reviewed:   Results are pending, will review when available. 09/28/2020 transthoracic echocardiogram report. IMPRESSIONS:   1. Left ventricular ejection fraction, by  estimation, is 60 to 65%. The left ventricle has normal function. The left ventricle has no regional wall motion abnormalities. There is mild left ventricular hypertrophy.  2. Right ventricular systolic function is normal. The right ventricular size is normal.  3. The mitral valve is normal in structure. No evidence of mitral valve regurgitation. No evidence of mitral stenosis.  4. The aortic valve is tricuspid.  5. The inferior vena cava is normal in size with greater than 50% respiratory variability, suggesting right atrial pressure of 3 mmHg.   Family Communication:  Primary team communication:  Thank you very much for involving us  in the care of your patient.  Author: Alm Dorn Castor, MD 06/29/2024 9:20 AM  For on call review [email or web address].   This document was prepared using Dragon voice recognition software and may contain some unintended transcription errors.

## 2024-06-30 ENCOUNTER — Inpatient Hospital Stay (HOSPITAL_COMMUNITY)

## 2024-06-30 DIAGNOSIS — Z4682 Encounter for fitting and adjustment of non-vascular catheter: Secondary | ICD-10-CM | POA: Diagnosis not present

## 2024-06-30 DIAGNOSIS — E875 Hyperkalemia: Secondary | ICD-10-CM

## 2024-06-30 DIAGNOSIS — R918 Other nonspecific abnormal finding of lung field: Secondary | ICD-10-CM | POA: Diagnosis not present

## 2024-06-30 DIAGNOSIS — Z720 Tobacco use: Secondary | ICD-10-CM

## 2024-06-30 DIAGNOSIS — J439 Emphysema, unspecified: Secondary | ICD-10-CM

## 2024-06-30 DIAGNOSIS — R0989 Other specified symptoms and signs involving the circulatory and respiratory systems: Secondary | ICD-10-CM | POA: Diagnosis not present

## 2024-06-30 DIAGNOSIS — R911 Solitary pulmonary nodule: Secondary | ICD-10-CM

## 2024-06-30 DIAGNOSIS — E66811 Obesity, class 1: Secondary | ICD-10-CM

## 2024-06-30 DIAGNOSIS — E785 Hyperlipidemia, unspecified: Secondary | ICD-10-CM

## 2024-06-30 DIAGNOSIS — I2510 Atherosclerotic heart disease of native coronary artery without angina pectoris: Secondary | ICD-10-CM

## 2024-06-30 DIAGNOSIS — I10 Essential (primary) hypertension: Secondary | ICD-10-CM

## 2024-06-30 DIAGNOSIS — J939 Pneumothorax, unspecified: Secondary | ICD-10-CM | POA: Diagnosis not present

## 2024-06-30 LAB — BASIC METABOLIC PANEL WITH GFR
Anion gap: 10 (ref 5–15)
BUN: 14 mg/dL (ref 6–20)
CO2: 25 mmol/L (ref 22–32)
Calcium: 8.5 mg/dL — ABNORMAL LOW (ref 8.9–10.3)
Chloride: 100 mmol/L (ref 98–111)
Creatinine, Ser: 0.87 mg/dL (ref 0.61–1.24)
GFR, Estimated: >60 mL/min (ref >60)
Glucose, Bld: 127 mg/dL — ABNORMAL HIGH (ref 70–99)
Potassium: 4.5 mmol/L (ref 3.5–5.1)
Sodium: 135 mmol/L (ref 135–145)

## 2024-06-30 LAB — PHOSPHORUS: Phosphorus: 2.6 mg/dL (ref 2.5–4.6)

## 2024-06-30 LAB — MAGNESIUM: Magnesium: 2.6 mg/dL — ABNORMAL HIGH (ref 1.7–2.4)

## 2024-06-30 MED ORDER — METOCLOPRAMIDE HCL 5 MG/ML IJ SOLN
10.0000 mg | Freq: Three times a day (TID) | INTRAMUSCULAR | Status: DC
  Administered 2024-06-30 – 2024-07-03 (×9): 10 mg via INTRAVENOUS
  Filled 2024-06-30 (×10): qty 2

## 2024-06-30 MED ORDER — BISACODYL 10 MG RE SUPP
10.0000 mg | Freq: Once | RECTAL | Status: AC
Start: 2024-06-30 — End: 2024-06-30
  Administered 2024-06-30: 10 mg via RECTAL
  Filled 2024-06-30: qty 1

## 2024-06-30 NOTE — Progress Notes (Signed)
 2 Days Post-Op  Subjective: Reports vomiting last night, says he is now on a liquid diet. Denies flatus or BM. Denies chest pain or SOB. Says he has only used his incentive spirometer once.    ROS: See above, otherwise other systems negative  Objective: Vital signs in last 24 hours: Temp:  [98.1 F (36.7 C)-99.5 F (37.5 C)] 98.6 F (37 C) (09/19 0945) Pulse Rate:  [84-101] 93 (09/19 0945) Resp:  [18-41] 19 (09/19 0945) BP: (110-166)/(68-99) 125/70 (09/19 0945) SpO2:  [90 %-98 %] 92 % (09/19 0945) Last BM Date : 06/28/24  Intake/Output from previous day: 09/18 0701 - 09/19 0700 In: 938.3 [I.V.:938.3] Out: 1705 [Urine:1650; Drains:45; Chest Tube:10] Intake/Output this shift: Total I/O In: -  Out: 375 [Urine:375]  PE: Gen: NAD Lungs: non-labored respirations on HFNC. Diminished breath sounds anterior, inferior lung field. Chest tube removed without complication.   Lab Results:  Recent Labs    06/28/24 1521 06/29/24 0309  HGB 16.8 17.3*  HCT 54.5* 53.7*   BMET Recent Labs    06/29/24 0309 06/30/24 0347  NA 133* 135  K 5.2* 4.5  CL 97* 100  CO2 24 25  GLUCOSE 151* 127*  BUN 12 14  CREATININE 1.13 0.87  CALCIUM  8.7* 8.5*   PT/INR No results for input(s): LABPROT, INR in the last 72 hours. CMP     Component Value Date/Time   NA 135 06/30/2024 0347   NA 138 09/15/2022 1411   K 4.5 06/30/2024 0347   CL 100 06/30/2024 0347   CO2 25 06/30/2024 0347   GLUCOSE 127 (H) 06/30/2024 0347   BUN 14 06/30/2024 0347   BUN 13 09/15/2022 1411   CREATININE 0.87 06/30/2024 0347   CALCIUM  8.5 (L) 06/30/2024 0347   PROT 7.8 05/23/2024 1244   ALBUMIN  4.2 05/23/2024 1244   AST 19 05/23/2024 1244   ALT 17 05/23/2024 1244   ALKPHOS 132 (H) 05/23/2024 1244   BILITOT 0.5 05/23/2024 1244   GFRNONAA >60 06/30/2024 0347   GFRAA >60 11/20/2019 0311   Lipase  No results found for: LIPASE     Studies/Results: DG Chest Port 1 View Result Date:  06/29/2024 CLINICAL DATA:  Pneumothorax on right. EXAM: PORTABLE CHEST 1 VIEW COMPARISON:  Radiograph yesterday.  Chest CT 04/13/2024 FINDINGS: Right basilar pigtail catheter is coiled at the lung base. No visible pneumothorax. Stable appearance of chronic lung disease. Stable heart size and mediastinal contours. Small amount of subcutaneous emphysema in the right lateral chest wall. IMPRESSION: 1. Right basilar pigtail catheter in place. No visible pneumothorax. 2. Stable chronic lung disease. Electronically Signed   By: Andrea Gasman M.D.   On: 06/29/2024 10:12   X-ray chest PA or AP Result Date: 06/28/2024 CLINICAL DATA:  [id number] Chest tube in place [id number] EXAM: CHEST  1 VIEW COMPARISON:  06/28/2024 3:24 p.m. FINDINGS: Small bore right-sided pigtail thoracostomy tube overlying the right infrahilar region. Low lung volumes with interval re-expansion of the right lung and resolution of the right pneumothorax. Underlying chronic lung disease changes. Patchy airspace opacities in the right lung base. Mild cardiomegaly. Tortuous aorta with aortic atherosclerosis. No acute fracture or destructive lesions. Multilevel thoracic osteophytosis. IMPRESSION: 1. Interval placement of a pigtail thoracostomy tube on the right with resolution of the right-sided pneumothorax. 2. Underlying chronic lung disease is similar with patchy opacities in the right lung base, probably atelectasis. Electronically Signed   By: Rogelia Myers M.D.   On: 06/28/2024 16:35   X-ray chest  PA or AP Result Date: 06/28/2024 CLINICAL DATA:  Postoperative complication. Shortness of breath and decreased oxygen saturation. EXAM: CHEST  1 VIEW COMPARISON:  Chest CT 04/13/2024 FINDINGS: Moderate right pneumothorax. There may be leftward mediastinal shift suggesting an element of tension. Subcutaneous emphysema in the right lateral chest wall. Heterogeneous bilateral lung opacities, likely related to underlying chronic lung disease. No significant  pleural effusion. There is no left pneumothorax. The heart is normal in size. IMPRESSION: 1. Moderate right pneumothorax with possible leftward mediastinal shift suggesting an element of tension. 2. Heterogeneous bilateral lung opacities, likely related to underlying chronic lung disease. Critical Value/emergent results were called by telephone at the time of interpretation on 06/28/2024 at 3:51 pm to nurse Josette , who verbally acknowledged these results. Referring clinician is aware, the patient was receiving a chest tube when I called. Electronically Signed   By: Andrea Gasman M.D.   On: 06/28/2024 15:53    Anti-infectives: Anti-infectives (From admission, onward)    Start     Dose/Rate Route Frequency Ordered Stop   06/28/24 1002  ceFAZolin  (ANCEF ) IVPB 2g/100 mL premix        2 g 200 mL/hr over 30 Minutes Intravenous 30 min pre-op 06/28/24 1002 06/28/24 1239        Assessment/Plan R PTX, s/p robotic assisted laparoscopic right partial nephrectomy -CT placed in PACU -CXR reviewed this am, not read by radiology yet. No visible PTX. Some chronic lung disease and what looks like increased haziness of the right lung field without major effusion -CT removed. CXR in 4 hours. If no recurrent PTX on x-ray, general surgery will sign off and be available as needed.  FEN - CLD VTE - SCDs ID - none currently  I reviewed Consultant urology notes, last 24 h vitals and pain scores, last 48 h intake and output, last 24 h labs and trends, and last 24 h imaging results.   LOS: 1 day    Almarie GORMAN Pringle , [facility] 06/30/2024, 9:56 AM Please see Amion for pager number during day hours 7:00am-4:30pm or 7:00am -11:30am on weekends

## 2024-06-30 NOTE — Discharge Summary (Deleted)
 Date of admission: 06/28/2024  Date of discharge: 06/30/2024  Admission diagnosis: Renal mass /cyst  Discharge diagnosis: renal mass/cyst  Secondary diagnoses:  - R Pneumothorax requiring chest tube placement - Hyperkalemia - Lung nodule  - CAD - Essential hypertension   History and Physical: For full details, please see admission history and physical. Briefly, Matthew [name] is a 58 y.o. year old patient with large right upper pole cyst.   Hospital Course: Patient was taken on 9/17 for partial nephrectomy. The patient tolerated the procedure well; however, in the PACU he was noted to have decrease saturations and found to have a right pneumothorax necessitating a chest tube.   On POD1, the patient progressed well. He was ambulating and tolerating regular diet. The chest tube was placed to water  seal without recurrence of the pneumothorax. Medicine evaluated the patient for hyperkalemia, which was corrected with fluids and Lokelma . The Foley catheter was removed, and he passed his trial of void. JP Cr was obtained and was negative.   On POD2, the chest tube was removed with no recurrence of pneumothorax. The patient passed flatus. The JP drain was removed, and the patient otherwise met criteria for discharge.   Laboratory values:  Recent Labs    06/28/24 1521 06/29/24 0309  HGB 16.8 17.3*  HCT 54.5* 53.7*   Recent Labs    06/29/24 0309 06/30/24 0347  CREATININE 1.13 0.87    Disposition: Home  Discharge instruction: The patient was instructed to be ambulatory but told to refrain from heavy lifting, strenuous activity, or driving.   Discharge medications:  Allergies as of 06/30/2024       Reactions   Codeine Anaphylaxis, Nausea And Vomiting   Onion Anaphylaxis, Swelling   thoart swells up   Bee Venom Hives        Medication List     STOP taking these medications    aspirin  EC 81 MG tablet   cyanocobalamin 1000 MCG tablet Commonly known as: VITAMIN B12        TAKE these medications    albuterol  108 (90 Base) MCG/ACT inhaler Commonly known as: VENTOLIN  HFA INHALE 2 PUFFS INTO THE LUNGS EVERY SIX HOURS AS NEEDED FOR WHEEZING OR SHORTNESS OF BREATH.   docusate sodium  100 MG capsule Commonly known as: COLACE Take 1 capsule (100 mg total) by mouth 2 (two) times daily.   loratadine-pseudoephedrine 10-240 MG 24 hr tablet Commonly known as: CLARITIN-D 24-hour Take 1 tablet by mouth daily.   predniSONE  10 MG tablet Commonly known as: DELTASONE  Take 10 mg by mouth as directed.   Spiriva  Respimat 2.5 MCG/ACT Aers Generic drug: Tiotropium Bromide  Monohydrate INHALE 2 PUFFS DAILY   traMADol  50 MG tablet Commonly known as: Ultram  Take 1-2 tablets (50-100 mg total) by mouth every 6 (six) hours as needed for moderate pain (pain score 4-6) or severe pain (pain score 7-10).        Followup:   Follow-up Information     Alvaro Ricardo KATHEE Raddle., MD Follow up on 07/18/2024.   Specialty: Urology Why: at 11:15 for MD visit and pathology review Contact information: [address] [place] [address] [phone]

## 2024-06-30 NOTE — Plan of Care (Signed)
  Problem: Education: Goal: Knowledge of General Education information will improve Description: Including pain rating scale, medication(s)/side effects and non-pharmacologic comfort measures Outcome: Progressing   Problem: Health Behavior/Discharge Planning: Goal: Ability to manage health-related needs will improve Outcome: Progressing   Problem: Clinical Measurements: Goal: Ability to maintain clinical measurements within normal limits will improve Outcome: Progressing Goal: Will remain free from infection Outcome: Progressing Goal: Diagnostic test results will improve Outcome: Progressing Goal: Respiratory complications will improve Outcome: Progressing Goal: Cardiovascular complication will be avoided Outcome: Progressing   Problem: Activity: Goal: Risk for activity intolerance will decrease Outcome: Progressing   Problem: Nutrition: Goal: Adequate nutrition will be maintained Outcome: Progressing   Problem: Coping: Goal: Level of anxiety will decrease Outcome: Progressing   Problem: Elimination: Goal: Will not experience complications related to bowel motility Outcome: Progressing Goal: Will not experience complications related to urinary retention Outcome: Progressing   Problem: Pain Managment: Goal: General experience of comfort will improve and/or be controlled Outcome: Progressing   Problem: Safety: Goal: Ability to remain free from injury will improve Outcome: Progressing   Problem: Skin Integrity: Goal: Risk for impaired skin integrity will decrease Outcome: Progressing   Problem: Education: Goal: Knowledge of the prescribed therapeutic regimen will improve Outcome: Progressing   Problem: Bowel/Gastric: Goal: Gastrointestinal status for postoperative course will improve Outcome: Progressing   Problem: Clinical Measurements: Goal: Postoperative complications will be avoided or minimized Outcome: Progressing   Problem: Respiratory: Goal:  Ability to achieve and maintain a regular respiratory rate will improve Outcome: Progressing   Problem: Skin Integrity: Goal: Demonstration of wound healing without infection will improve Outcome: Progressing   Problem: Urinary Elimination: Goal: Ability to avoid or minimize complications of infection will improve Outcome: Progressing Goal: Ability to achieve and maintain urine output will improve Outcome: Progressing

## 2024-06-30 NOTE — Plan of Care (Signed)
 S:  1 - RIGHT Renal Mass - s/p RT robotic partial nephrectomy for large upper pole cystic mass 9/17. Path pending. JP removed 9/19 as output minimal and Cr same as serum.   2 - RIGHT Pneumothorax / H/o COPD - pt with COPD on O2 variably at baseline up to 4L PRN. Rt PTX noted 9/17 in PACU and pigtail chest tube placed. CXR 9/18 on water  seal with resolved PTX, removed chest tube 9/19 with continued resolution of PTX 4 hour post pull but still with O2 req >4L.  Today Matthew [name] is making progress but O2 req keeps him here. Wants to go home.  O: NAD with wife at bedside Non-labored breathing on [place] O2 RRR Stable truncal obesity. Prior port sites c/d/I No c/c/e  A/P  Remain in house until O2 req <4L. If remains high, consider pulm consult, may need adjunctive maneuvers. He is in agreement.

## 2024-06-30 NOTE — Progress Notes (Signed)
   06/30/24 0836  TOC Brief Assessment  Insurance and Status Reviewed  Patient has primary care physician Yes  Home environment has been reviewed Resides in single family home with spouse  Prior level of function: Independent with ADLs at baseline  Prior/Current Home Services No current home services  Social Drivers of Health Review SDOH reviewed no interventions necessary  Readmission risk has been reviewed Yes  Transition of care needs no transition of care needs at this time

## 2024-06-30 NOTE — Progress Notes (Signed)
 2 Days Post-Op s/p partial nephrectomy for large upper pole cyst. 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   Subjective: Had some emesis overnight. No nausea this AM. Abdomen slightly more distended and tender on RLQ. Chest tube to water  seal, gen surg plan to take it out today. Drain with minimal s/s output. Labs normalized.   Objective: Vital signs in last 24 hours: Temp:  [97.4 F (36.3 C)-99.5 F (37.5 C)] 98.6 F (37 C) (09/19 0634) Pulse Rate:  [75-101] 88 (09/19 0634) Resp:  [18-41] 20 (09/19 0634) BP: (110-166)/(68-99) 110/68 (09/19 0634) SpO2:  [90 %-98 %] 94 % (09/19 0634)  Intake/Output from previous day: 09/18 0701 - 09/19 0700 In: 938.3 [I.V.:938.3] Out: 1705 [Urine:1650; Drains:45; Chest Tube:10] Intake/Output this shift: No intake/output data recorded.  Physical Exam:  General: Alert and oriented. CV: Regular rate  Pulm: Chest tube in place, on water  seal. No increased WOB.  GI: Soft, Slightly distended. Drain with s/s output.  Incisions: Clean and dry. Urine: Voiding spontaneously  Extremities: Nontender, no erythema, no edema.  Lab Results: Recent Labs    06/28/24 1521 06/29/24 0309  HGB 16.8 17.3*  HCT 54.5* 53.7*          Recent Labs    06/29/24 0309 06/30/24 0347  CREATININE 1.13 0.87           Results for orders placed or performed during the hospital encounter of 06/28/24 (from the past 24 hours)  Creatinine, fluid (JP Drainage)     Status: None   Collection Time: 06/29/24 10:23 AM  Result Value Ref Range   Creat, Fluid 1.0 mg/dL   Fluid Type-FCRE JP DRAINAGE   Basic metabolic panel     Status: Abnormal   Collection Time: 06/30/24  3:47 AM  Result Value Ref Range   Sodium 135 135 - 145 mmol/L   Potassium 4.5 3.5 - 5.1 mmol/L   Chloride 100 98 - 111 mmol/L   CO2 25 22 - 32 mmol/L   Glucose, Bld 127 (H) 70 - 99 mg/dL   BUN 14 6 - 20 mg/dL   Creatinine, Ser 9.12 0.61 - 1.24 mg/dL   Calcium   8.5 (L) 8.9 - 10.3 mg/dL   GFR, Estimated >39 >39 mL/min   Anion gap 10 5 - 15    Assessment/Plan: POD# 2 s/p robotic R partial nephrectomy 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  - Chest tube per general surgery, appreciate their assistance  - Ambulate, Incentive spirometry - Transition to CLD, will monitor abdominal exam  - Transition to oral pain medication - Bowel regimen - add suppository and Reglan  q8  - Continue drain, monitor output with removal of chest tube. D/c is output remains minimal through afternoon.    LOS: 1 day   Lyle LOISE Civil 06/30/2024, 7:32 AM

## 2024-06-30 NOTE — Hospital Course (Addendum)
 Matthew [name] is a 58 y.o. male with past medical history of anxiety, CAD, history of RCA stent placement, GERD, hyperlipidemia, class I obesity, hypertension, left elbow bursitis, emphysema, history of respiratory failure, tobacco abuse who underwent a robotically assisted partial nephrectomy due to large right renal cystic mass who developed hypoxia while in PACU with an x-ray showing a right pneumothorax.  TRH consulted for evaluation of electrolyte abnormalities -hyperkalemia. Now having issues with Respiratory Failure and remains on high O2.   Assessment and Plan:  Right Renal Mass Status post partial right nephrectomy for a large upper pole cystic mass was done by Dr. Patrcia on 917. Continue postop management per primary team.  His JP was removed by 19 and he had minimal output and creatinine remains the same.   Hyperkalemia: s/p Lokelma  10 g p.o. x 1 dose and continued on IVF. K+ Trend:  Recent Labs  Lab 06/15/24 1451 06/29/24 0309 06/30/24 0347 07/01/24 0925  K 4.2 5.2* 4.5 4.5  -CTM and Trend and repeat CMP in the AM   Hyponatremia: Mild and Fluctuating. Na+ went from 135 -> 133 -> 135 -> 134. CTM and Trend and repeat CMP in the AM   Acute Respiratory Failure w/ Hypoxia: In the setting of Below. SpO2: 90 % O2 Flow Rate (L/min): 15 L/min; Wean O2 and will need an Ambulatory Home O2 Screen prior to D/C and repeat CXR in the AM. If not improving will obtain CTA Chest   Pneumothorax on the Right: Continued with chest tube per surgery.  It is a right pneumothorax noted on 9/17 in the PACU and had pigtail chest tube inserted and chest tube was placed to waterseal on 9/18 and resolved and subsequently removed on 919.  Continue to wean supplemental oxygen; Repeat CXR on the afternoon of 9/19 showed Interval removal of right-sided pleural drainage catheter. No pneumothorax. Order Flutter Valve and Incentive Spirometry    Pulmonary emphysema, unspecified emphysema type (HCC): Stop  bronchodilators w/ Incruse Ellipta  and Albuterol  3 mL IH q6hprn Wheezing and SOB and change to scheduled Xopenex /Atrovent , Brovana  and Budesonide  and add Guaifenesin  1200 mg p.o. twice daily, flutter valve, incentive spirometry. PT/OT to evaluate and Treat. Repeat CXR this AM pendign.   Leukocytosis: In the setting of above. WBC went from 14.9 -> 15.3. CTM for S/Sx of Infection. Repeat CBC in the AM   Lung nodule: Follow-up with pulmonary medicine as an outpatient and will need Surveillance    Tobacco abuse: Tobacco cessation advised. Nicotine replacement therapy may be ordered as needed.   CAD (coronary artery disease): Resume Aspirin  once cleared urology.   Essential hypertension: Pain control. Antihypertensives as needed. CTM BP per Protocol. Last BP reading was 116/71   Hyperlipidemia: Follow-up with primary care provider. Might benefit from statin therapy given Hx of CAD.  Hypoalbuminemia: Patient's Albumin  Lvl is now 3.4. CTM and Trend and repeat CMP in the AM   Class I Obesity: Complicates overall prognosis and care. Estimated body mass index is 33.73 kg/m as calculated from the following:   Height as of this encounter: 5' 11 (1.803 m).   Weight as of this encounter: 109.7 kg. Weight Loss and Dietary Counseling given

## 2024-06-30 NOTE — Progress Notes (Signed)
 Attempted to trial the patient off of supplemental oxygen and shortly after taking the oxygen off, the patient's oxygen saturation decreased to 84%. Patient was placed back on 15 liters high flow nasal cannula to help him recover. Patient increased back up to 91% on the 15 liters. The patient was also assessed using the incentive spirometer. Patient was only able to get to ~750 on the incentive spirometer a total of 6 times before oxygen saturation dropped to 85% while on 15 liters of high flow oxygen. Did not attempt to ambulate the patient at this time as he was unable to tolerate room air trial and incentive spirometer.   Leonor FORBES Clarks, BSN, RN 06/30/24 4:33 PM

## 2024-06-30 NOTE — Progress Notes (Signed)
 PROGRESS NOTE    Matthew [name]  FMW:[id number] DOB: [birth year] DOA: 06/28/2024 PCP: Geronimo Amel, MD   Brief Narrative:  Matthew [name] is a 58 y.o. male with past medical history of anxiety, CAD, history of RCA stent placement, GERD, hyperlipidemia, class I obesity, hypertension, left elbow bursitis, emphysema, history of respiratory failure, tobacco abuse who underwent a robotically assisted partial nephrectomy due to large right renal cystic mass who developed hypoxia while in PACU with an x-ray showing a right pneumothorax.  TRH consulted for evaluation of electrolyte abnormalities -hyperkalemia.   Assessment and Plan:  Right Renal Mass Status post partial right nephrectomy for a large upper pole cystic mass was done by Dr. Patrcia on 917. Continue postop management per primary team.  His JP was removed by 19 and he had minimal output and creatinine remains the same.   Hyperkalemia: s/p Lokelma  10 g p.o. x 1 dose and continued on IVF. K+ Trend:  Recent Labs  Lab 06/15/24 1451 06/29/24 0309 06/30/24 0347  K 4.2 5.2* 4.5  -CTM and Trend and repeat CMP in the AM   Hyponatremia: Mild and Improved. Na+ Trend:  Recent Labs  Lab 06/15/24 1451 06/29/24 0309 06/30/24 0347  NA 135 133* 135  -CTM and Trend and repeat CMP in the AM    Pneumothorax on right: Continue with chest tube per surgery.  It is a right pneumothorax noted on 917 in the PACU and had pigtail chest tube inserted and chest tube was placed to waterseal on 918 and resolved and subsequently removed on 919.  Continue to wean supplemental oxygen; Repeat CXR on the afternoon of 9/19 showed Interval removal of right-sided pleural drainage catheter. No pneumothorax. Order Flutter Valve and Incentive Spirometry    Pulmonary emphysema, unspecified emphysema type (HCC): Continue bronchodilators w/ Incruse Ellipta  and Albuterol  3 mL IH q6hprn Wheezing and SOB.    Lung nodule: Follow-up with pulmonary medicine as an  outpatient and will need Surveillance    Tobacco abuse: Tobacco cessation advised. Nicotine replacement therapy may be ordered as needed.   CAD (coronary artery disease): Resume aspirin  once cleared urology.   Essential hypertension: Pain control. Antihypertensives as needed. CTM BP per Protocol. Last BP reading was 122/74   Hyperlipidemia: Follow-up with primary care provider. Might benefit from statin therapy given Hx of CAD.   Class I Obesity: Complicates overall prognosis and care. Estimated body mass index is 33.73 kg/m as calculated from the following:   Height as of this encounter: 5' 11 (1.803 m).   Weight as of this encounter: 109.7 kg. Weight Loss and Dietary Counseling given   DVT prophylaxis: heparin  injection 5,000 Units Start: 06/29/24 1400 SCDs Start: 06/28/24 1742    Code Status: Full Code Family Communication: D/w Wife @ bedside   Disposition Plan:  Level of care: Med-Surg Status is: Inpatient Remains inpatient appropriate because: Needs further clinical Improvement    Consultants:  General Surgery [facility] Urology (Primary)  Procedures:  As delineated as above   Antimicrobials:  Anti-infectives (From admission, onward)    Start     Dose/Rate Route Frequency Ordered Stop   06/28/24 1002  ceFAZolin  (ANCEF ) IVPB 2g/100 mL premix        2 g 200 mL/hr over 30 Minutes Intravenous 30 min pre-op 06/28/24 1002 06/28/24 1239       Subjective: Seen and examined at bedside and he is doing fairly well and had just had his chest tube pulled.  Had some vomiting yesterday and is  now on a clear liquid diet.  Wanting to go home.  Denies any real pain anywhere and states she has minimal.  No other concerns or complaints this time.  Objective: Vitals:   06/30/24 0235 06/30/24 0634 06/30/24 0945 06/30/24 1345  BP: 114/76 110/68 125/70 122/74  Pulse: 94 88 93 94  Resp: (!) 22 20 19 19   Temp: 99.5 F (37.5 C) 98.6 F (37 C) 98.6 F (37 C) 97.6 F (36.4 C)  TempSrc:  Oral Oral Oral Oral  SpO2: 93% 94% 92% 93%  Weight:      Height:        Intake/Output Summary (Last 24 hours) at 06/30/2024 1632 Last data filed at 06/30/2024 1300 Gross per 24 hour  Intake 938.29 ml  Output 1130 ml  Net -191.71 ml   Filed Weights   06/28/24 1042  Weight: 109.7 kg   Examination: Physical Exam:  Constitutional: WN/WD obese Caucasian male in no acute distress Respiratory: Diminished to auscultation bilaterally, no wheezing, rales, rhonchi or crackles. Normal respiratory effort and patient is not tachypenic. No accessory muscle use.  Unlabored breathing Cardiovascular: RRR, no murmurs / rubs / gallops. S1 and S2 auscultated. No extremity edema.  Abdomen: Soft, non-tender, distended secondary body habitus bowel sounds positive.  GU: Deferred. Musculoskeletal: No clubbing / cyanosis of digits/nails. No joint deformity upper and lower extremities.  Neurologic: CN 2-12 grossly intact with no focal deficits. Romberg sign and cerebellar reflexes not assessed.  Psychiatric: Normal judgment and insight. Alert and oriented x 3. Normal mood and appropriate affect.   Data Reviewed: I have personally reviewed following labs and imaging studies  CBC: Recent Labs  Lab 06/28/24 1521 06/29/24 0309  HGB 16.8 17.3*  HCT 54.5* 53.7*   Basic Metabolic Panel: Recent Labs  Lab 06/29/24 0309 06/30/24 0347  NA 133* 135  K 5.2* 4.5  CL 97* 100  CO2 24 25  GLUCOSE 151* 127*  BUN 12 14  CREATININE 1.13 0.87  CALCIUM  8.7* 8.5*  MG  --  2.6*  PHOS  --  2.6   GFR: Estimated Creatinine Clearance: 115.2 mL/min (by C-G formula based on SCr of 0.87 mg/dL). Liver Function Tests: No results for input(s): AST, ALT, ALKPHOS, BILITOT, PROT, ALBUMIN  in the last 168 hours. No results for input(s): LIPASE, AMYLASE in the last 168 hours. No results for input(s): AMMONIA in the last 168 hours. Coagulation Profile: No results for input(s): INR, PROTIME in the last  168 hours. Cardiac Enzymes: No results for input(s): CKTOTAL, CKMB, CKMBINDEX, TROPONINI in the last 168 hours. BNP (last 3 results) No results for input(s): PROBNP in the last 8760 hours. HbA1C: No results for input(s): HGBA1C in the last 72 hours. CBG: No results for input(s): GLUCAP in the last 168 hours. Lipid Profile: No results for input(s): CHOL, HDL, LDLCALC, TRIG, CHOLHDL, LDLDIRECT in the last 72 hours. Thyroid Function Tests: No results for input(s): TSH, T4TOTAL, FREET4, T3FREE, THYROIDAB in the last 72 hours. Anemia Panel: No results for input(s): VITAMINB12, FOLATE, FERRITIN, TIBC, IRON , RETICCTPCT in the last 72 hours. Sepsis Labs: No results for input(s): PROCALCITON, LATICACIDVEN in the last 168 hours.  Recent Results (from the past 240 hours)  MRSA Next Gen by PCR, Nasal     Status: Abnormal   Collection Time: 06/28/24 10:30 PM   Specimen: Nasal Mucosa; Nasal Swab  Result Value Ref Range Status   MRSA by PCR Next Gen DETECTED (A) NOT DETECTED Final    Comment: (NOTE) The GeneXpert  MRSA Assay (FDA approved for NASAL specimens only), is one component of a comprehensive MRSA colonization surveillance program. It is not intended to diagnose MRSA infection nor to guide or monitor treatment for MRSA infections. Test performance is not FDA approved in patients less than 42 years old. Performed at [facility], [address] [place], [place] [address]     Radiology Studies: DG CHEST PORT 1 VIEW Result Date: 06/30/2024 EXAM: 1 VIEW XRAY OF THE CHEST 06/30/2024 01:23:00 PM COMPARISON: 06/30/2024 CLINICAL HISTORY: Encounter for chest tube removal [id number]. FINDINGS: LINES, TUBES AND DEVICES: Interval removal of right-sided pleural drainage catheter. LUNGS AND PLEURA: No pneumothorax. Low lung volume. Chronic increased interstitial markings throughout bilateral lungs. Blunting of left costophrenic angle,  stable. HEART AND MEDIASTINUM: Unchanged heart size and mediastinal contours. BONES AND SOFT TISSUES: No acute osseous abnormality. IMPRESSION: 1. Interval removal of right-sided pleural drainage catheter. No pneumothorax. Electronically signed by: Andrea Gasman MD 06/30/2024 03:13 PM EDT RP Workstation: [id number]   DG CHEST PORT 1 VIEW Result Date: 06/30/2024 EXAM: 1 VIEW XRAY OF THE CHEST 06/30/2024 04:55:00 AM COMPARISON: 06/29/2024 CLINICAL HISTORY: Pneumothorax, right pneumothorax FINDINGS: LINES, TUBES AND DEVICES: Stable right basilar pigtail chest tube. LUNGS AND PLEURA: No pneumothorax. Low lung volumes persist. Diffuse interstitial opacities, slightly progressed from prior. HEART AND MEDIASTINUM: Unchanged heart size and mediastinal contours. BONES AND SOFT TISSUES: No acute osseous abnormality. IMPRESSION: 1. Stable right basilar pigtail chest tube. No pneumothorax. 2. Low lung volumes and diffuse interstitial opacities, slightly progressed from prior. 3. Possible small bilateral pleural effusions. Electronically signed by: Andrea Gasman MD 06/30/2024 01:06 PM EDT RP Workstation: [id number]   DG Chest Port 1 View Result Date: 06/29/2024 CLINICAL DATA:  Pneumothorax on right. EXAM: PORTABLE CHEST 1 VIEW COMPARISON:  Radiograph yesterday.  Chest CT 04/13/2024 FINDINGS: Right basilar pigtail catheter is coiled at the lung base. No visible pneumothorax. Stable appearance of chronic lung disease. Stable heart size and mediastinal contours. Small amount of subcutaneous emphysema in the right lateral chest wall. IMPRESSION: 1. Right basilar pigtail catheter in place. No visible pneumothorax. 2. Stable chronic lung disease. Electronically Signed   By: Andrea Gasman M.D.   On: 06/29/2024 10:12   Scheduled Meds:  Chlorhexidine  Gluconate Cloth  6 each Topical QHS   famotidine   20 mg Oral BID   heparin  injection (subcutaneous)  5,000 Units Subcutaneous Q8H   metoCLOPramide  (REGLAN ) injection  10  mg Intravenous Q8H   mupirocin  ointment  1 Application Nasal BID   senna  2 tablet Oral Daily   umeclidinium bromide   1 puff Inhalation Daily   Continuous Infusions:   LOS: 1 day   Alejandro Marker, DO [facility] Available via Epic secure chat 7am-7pm After these hours, please refer to coverage provider listed on [email or web address] 06/30/2024, 4:32 PM

## 2024-07-01 ENCOUNTER — Inpatient Hospital Stay (HOSPITAL_COMMUNITY)

## 2024-07-01 DIAGNOSIS — U099 Post covid-19 condition, unspecified: Secondary | ICD-10-CM

## 2024-07-01 DIAGNOSIS — D72829 Elevated white blood cell count, unspecified: Secondary | ICD-10-CM

## 2024-07-01 LAB — CBC WITH DIFFERENTIAL/PLATELET
Abs Immature Granulocytes: 0.07 K/uL (ref 0.00–0.07)
Basophils Absolute: 0.1 K/uL (ref 0.0–0.1)
Basophils Relative: 0 %
Eosinophils Absolute: 0.2 K/uL (ref 0.0–0.5)
Eosinophils Relative: 1 %
HCT: 52.7 % — ABNORMAL HIGH (ref 39.0–52.0)
Hemoglobin: 16.6 g/dL (ref 13.0–17.0)
Immature Granulocytes: 1 %
Lymphocytes Relative: 9 %
Lymphs Abs: 1.4 K/uL (ref 0.7–4.0)
MCH: 29.6 pg (ref 26.0–34.0)
MCHC: 31.5 g/dL (ref 30.0–36.0)
MCV: 94.1 fL (ref 80.0–100.0)
Monocytes Absolute: 1.2 K/uL — ABNORMAL HIGH (ref 0.1–1.0)
Monocytes Relative: 8 %
Neutro Abs: 12.4 K/uL — ABNORMAL HIGH (ref 1.7–7.7)
Neutrophils Relative %: 81 %
Platelets: 218 K/uL (ref 150–400)
RBC: 5.6 MIL/uL (ref 4.22–5.81)
RDW: 14.6 % (ref 11.5–15.5)
WBC: 15.3 K/uL — ABNORMAL HIGH (ref 4.0–10.5)
nRBC: 0 % (ref 0.0–0.2)

## 2024-07-01 LAB — COMPREHENSIVE METABOLIC PANEL WITH GFR
ALT: 61 U/L — ABNORMAL HIGH (ref 0–44)
AST: 30 U/L (ref 15–41)
Albumin: 3.4 g/dL — ABNORMAL LOW (ref 3.5–5.0)
Alkaline Phosphatase: 101 U/L (ref 38–126)
Anion gap: 12 (ref 5–15)
BUN: 17 mg/dL (ref 6–20)
CO2: 25 mmol/L (ref 22–32)
Calcium: 9 mg/dL (ref 8.9–10.3)
Chloride: 98 mmol/L (ref 98–111)
Creatinine, Ser: 0.98 mg/dL (ref 0.61–1.24)
GFR, Estimated: >60 mL/min (ref >60)
Glucose, Bld: 91 mg/dL (ref 70–99)
Potassium: 4.5 mmol/L (ref 3.5–5.1)
Sodium: 134 mmol/L — ABNORMAL LOW (ref 135–145)
Total Bilirubin: 1.1 mg/dL (ref 0.0–1.2)
Total Protein: 6.9 g/dL (ref 6.5–8.1)

## 2024-07-01 LAB — RESP PANEL BY RT-PCR (RSV, FLU A&B, COVID)  RVPGX2
Influenza A by PCR: NEGATIVE
Influenza B by PCR: NEGATIVE
Resp Syncytial Virus by PCR: NEGATIVE
SARS Coronavirus 2 by RT PCR: NEGATIVE

## 2024-07-01 LAB — PHOSPHORUS: Phosphorus: 3.2 mg/dL (ref 2.5–4.6)

## 2024-07-01 LAB — MAGNESIUM: Magnesium: 2.5 mg/dL — ABNORMAL HIGH (ref 1.7–2.4)

## 2024-07-01 LAB — PRO BRAIN NATRIURETIC PEPTIDE: Pro Brain Natriuretic Peptide: <50 pg/mL (ref <300.0)

## 2024-07-01 MED ORDER — LEVALBUTEROL HCL 0.63 MG/3ML IN NEBU
0.6300 mg | INHALATION_SOLUTION | Freq: Four times a day (QID) | RESPIRATORY_TRACT | Status: DC
  Administered 2024-07-01 (×2): 0.63 mg via RESPIRATORY_TRACT
  Filled 2024-07-01 (×2): qty 3

## 2024-07-01 MED ORDER — IPRATROPIUM BROMIDE 0.02 % IN SOLN
0.5000 mg | Freq: Four times a day (QID) | RESPIRATORY_TRACT | Status: DC
  Administered 2024-07-01: 0.5 mg via RESPIRATORY_TRACT
  Filled 2024-07-01: qty 2.5

## 2024-07-01 MED ORDER — METHYLPREDNISOLONE SODIUM SUCC 40 MG IJ SOLR
40.0000 mg | Freq: Every day | INTRAMUSCULAR | Status: DC
  Administered 2024-07-02 – 2024-07-03 (×2): 40 mg via INTRAVENOUS
  Filled 2024-07-01 (×2): qty 1

## 2024-07-01 MED ORDER — LINEZOLID 600 MG/300ML IV SOLN
600.0000 mg | Freq: Two times a day (BID) | INTRAVENOUS | Status: DC
  Administered 2024-07-01 – 2024-07-03 (×4): 600 mg via INTRAVENOUS
  Filled 2024-07-01 (×4): qty 300

## 2024-07-01 MED ORDER — ARFORMOTEROL TARTRATE 15 MCG/2ML IN NEBU
15.0000 ug | INHALATION_SOLUTION | Freq: Two times a day (BID) | RESPIRATORY_TRACT | Status: DC
  Administered 2024-07-01 – 2024-07-03 (×4): 15 ug via RESPIRATORY_TRACT
  Filled 2024-07-01 (×4): qty 2

## 2024-07-01 MED ORDER — PIPERACILLIN-TAZOBACTAM 3.375 G IVPB
3.3750 g | Freq: Three times a day (TID) | INTRAVENOUS | Status: DC
  Administered 2024-07-01 – 2024-07-03 (×4): 3.375 g via INTRAVENOUS
  Filled 2024-07-01 (×4): qty 50

## 2024-07-01 MED ORDER — BUDESONIDE 0.25 MG/2ML IN SUSP
0.2500 mg | Freq: Two times a day (BID) | RESPIRATORY_TRACT | Status: DC
Start: 2024-07-01 — End: 2024-07-01

## 2024-07-01 MED ORDER — BUDESONIDE 0.25 MG/2ML IN SUSP: 0.2500 mg | Freq: Two times a day (BID) | RESPIRATORY_TRACT | Status: DC

## 2024-07-01 MED ORDER — METHYLPREDNISOLONE SODIUM SUCC 40 MG IJ SOLR: 40.0000 mg | Freq: Two times a day (BID) | INTRAMUSCULAR | Status: DC

## 2024-07-01 MED ORDER — FUROSEMIDE 10 MG/ML IJ SOLN
60.0000 mg | Freq: Once | INTRAMUSCULAR | Status: AC
  Administered 2024-07-01: 60 mg via INTRAVENOUS
  Filled 2024-07-01: qty 6

## 2024-07-01 MED ORDER — REVEFENACIN 175 MCG/3ML IN SOLN
175.0000 ug | Freq: Every day | RESPIRATORY_TRACT | Status: DC
Start: 2024-07-01 — End: 2024-07-03
  Administered 2024-07-02 – 2024-07-03 (×2): 175 ug via RESPIRATORY_TRACT
  Filled 2024-07-01 (×2): qty 3

## 2024-07-01 MED ORDER — METHYLPREDNISOLONE SODIUM SUCC 125 MG IJ SOLR
125.0000 mg | Freq: Once | INTRAMUSCULAR | Status: AC
  Administered 2024-07-01: 125 mg via INTRAVENOUS
  Filled 2024-07-01: qty 2

## 2024-07-01 MED ORDER — ARFORMOTEROL TARTRATE 15 MCG/2ML IN NEBU: 15.0000 ug | INHALATION_SOLUTION | Freq: Two times a day (BID) | RESPIRATORY_TRACT | Status: DC

## 2024-07-01 NOTE — Progress Notes (Signed)
 PROGRESS NOTE    Matthew [name]  FMW:[id number] DOB: [birth year] DOA: 06/28/2024 PCP: Geronimo Amel, MD   Brief Narrative:  Matthew [name] is a 58 y.o. male with past medical history of anxiety, CAD, history of RCA stent placement, GERD, hyperlipidemia, class I obesity, hypertension, left elbow bursitis, emphysema, history of respiratory failure, tobacco abuse who underwent a robotically assisted partial nephrectomy due to large right renal cystic mass who developed hypoxia while in PACU with an x-ray showing a right pneumothorax.  TRH consulted for evaluation of electrolyte abnormalities -hyperkalemia. Now having issues with Respiratory Failure and remains on high O2.   Assessment and Plan:  Right Renal Mass Status post partial right nephrectomy for a large upper pole cystic mass was done by Dr. Patrcia on 917. Continue postop management per primary team.  His JP was removed by 19 and he had minimal output and creatinine remains the same.   Hyperkalemia: s/p Lokelma  10 g p.o. x 1 dose and continued on IVF. K+ Trend:  Recent Labs  Lab 06/15/24 1451 06/29/24 0309 06/30/24 0347 07/01/24 0925  K 4.2 5.2* 4.5 4.5  -CTM and Trend and repeat CMP in the AM   Hyponatremia: Mild and Fluctuating. Na+ went from 135 -> 133 -> 135 -> 134. CTM and Trend and repeat CMP in the AM   Acute Respiratory Failure w/ Hypoxia: In the setting of Below. SpO2: 90 % O2 Flow Rate (L/min): 15 L/min; Wean O2 and will need an Ambulatory Home O2 Screen prior to D/C and repeat CXR in the AM. If not improving will obtain CTA Chest   Pneumothorax on the Right: Continue with chest tube per surgery.  It is a right pneumothorax noted on 917 in the PACU and had pigtail chest tube inserted and chest tube was placed to waterseal on 918 and resolved and subsequently removed on 919.  Continue to wean supplemental oxygen; Repeat CXR on the afternoon of 9/19 showed Interval removal of right-sided pleural drainage catheter.  No pneumothorax. Order Flutter Valve and Incentive Spirometry    Pulmonary emphysema, unspecified emphysema type (HCC): Stop bronchodilators w/ Incruse Ellipta  and Albuterol  3 mL IH q6hprn Wheezing and SOB and change to scheduled Xopenex /Atrovent , Brovana  and Budesonide  and add Guaifenesin  1200 mg p.o. twice daily, flutter valve, incentive spirometry. PT/OT to evaluate and Treat. Repeat CXR this AM pendign.   Leukocytosis: In the setting of above. WBC went from 14.9 -> 15.3. CTM for S/Sx of Infection. Repeat CBC in the AM   Lung nodule: Follow-up with pulmonary medicine as an outpatient and will need Surveillance    Tobacco abuse: Tobacco cessation advised. Nicotine replacement therapy may be ordered as needed.   CAD (coronary artery disease): Resume Aspirin  once cleared urology.   Essential hypertension: Pain control. Antihypertensives as needed. CTM BP per Protocol. Last BP reading was 116/71   Hyperlipidemia: Follow-up with primary care provider. Might benefit from statin therapy given Hx of CAD.  Hypoalbuminemia: Patient's Albumin  Lvl is now 3.4. CTM and Trend and repeat CMP in the AM   Class I Obesity: Complicates overall prognosis and care. Estimated body mass index is 33.73 kg/m as calculated from the following:   Height as of this encounter: 5' 11 (1.803 m).   Weight as of this encounter: 109.7 kg. Weight Loss and Dietary Counseling given   DVT prophylaxis: heparin  injection 5,000 Units Start: 06/29/24 1400 SCDs Start: 06/28/24 1742    Code Status: Full Code Family Communication: No family present @ bedside  Disposition Plan:  Level of care: Med-Surg Status is: Inpatient Remains inpatient appropriate because: Further clinical improvement and weaning of oxygen prior to discharge   Consultants:  [facility] General Surgery Urology (Primary)  Procedures:  As delineated as above  Antimicrobials:  Anti-infectives (From admission, onward)    Start     Dose/Rate Route Frequency  Ordered Stop   06/28/24 1002  ceFAZolin  (ANCEF ) IVPB 2g/100 mL premix        2 g 200 mL/hr over 30 Minutes Intravenous 30 min pre-op 06/28/24 1002 06/28/24 1239       Subjective: Seen and examined at bedside and states that he is comfortable on the oxygen.  Denied any chest pain or shortness of breath.  Having some incisional abdominal pain.  Diet is being advanced and he is asking for a hamburger.  No other concerns or complaints at this time.  Objective: Vitals:   06/30/24 1345 06/30/24 2038 07/01/24 0436 07/01/24 0725  BP: 122/74 124/78 116/71   Pulse: 94 88 97   Resp: 19 (!) 24 20   Temp: 97.6 F (36.4 C) 98.1 F (36.7 C) 98.2 F (36.8 C)   TempSrc: Oral Oral Oral   SpO2: 93% 92% 91% 90%  Weight:      Height:        Intake/Output Summary (Last 24 hours) at 07/01/2024 1318 Last data filed at 07/01/2024 1100 Gross per 24 hour  Intake 240 ml  Output 1000 ml  Net -760 ml   Filed Weights   06/28/24 1042  Weight: 109.7 kg   Examination: Physical Exam:  Constitutional: WN/WD obese Caucasian male in no acute distress Respiratory: Diminished to auscultation bilaterally with some coarse breath sounds, no wheezing, rales, rhonchi or crackles. Normal respiratory effort and patient is not tachypenic. No accessory muscle use.  Unlabored breathing and is wearing supplemental oxygen via nasal cannula at 15 L Cardiovascular: RRR, no murmurs / rubs / gallops. S1 and S2 auscultated. No extremity edema.  Abdomen: Soft, mildly-tender, distended secondary to body habitus.. Bowel sounds positive.  GU: Deferred. Musculoskeletal: No clubbing / cyanosis of digits/nails. No joint deformity upper and lower extremities.  Skin: No rashes, lesions, ulcers on limited skin evaluation. No induration; Warm and dry.  Neurologic: CN 2-12 grossly intact with no focal deficits. Romberg sign and cerebellar reflexes not assessed.  Psychiatric: Normal judgment and insight. Alert and oriented x 3. Normal mood  and appropriate affect.   Data Reviewed: I have personally reviewed following labs and imaging studies  CBC: Recent Labs  Lab 06/28/24 1521 06/29/24 0309 07/01/24 0925  WBC  --   --  15.3*  NEUTROABS  --   --  12.4*  HGB 16.8 17.3* 16.6  HCT 54.5* 53.7* 52.7*  MCV  --   --  94.1  PLT  --   --  218   Basic Metabolic Panel: Recent Labs  Lab 06/29/24 0309 06/30/24 0347 07/01/24 0925  NA 133* 135 134*  K 5.2* 4.5 4.5  CL 97* 100 98  CO2 24 25 25   GLUCOSE 151* 127* 91  BUN 12 14 17   CREATININE 1.13 0.87 0.98  CALCIUM  8.7* 8.5* 9.0  MG  --  2.6* 2.5*  PHOS  --  2.6 3.2   GFR: Estimated Creatinine Clearance: 102.3 mL/min (by C-G formula based on SCr of 0.98 mg/dL). Liver Function Tests: Recent Labs  Lab 07/01/24 0925  AST 30  ALT 61*  ALKPHOS 101  BILITOT 1.1  PROT 6.9  ALBUMIN   3.4*   No results for input(s): LIPASE, AMYLASE in the last 168 hours. No results for input(s): AMMONIA in the last 168 hours. Coagulation Profile: No results for input(s): INR, PROTIME in the last 168 hours. Cardiac Enzymes: No results for input(s): CKTOTAL, CKMB, CKMBINDEX, TROPONINI in the last 168 hours. BNP (last 3 results) No results for input(s): PROBNP in the last 8760 hours. HbA1C: No results for input(s): HGBA1C in the last 72 hours. CBG: No results for input(s): GLUCAP in the last 168 hours. Lipid Profile: No results for input(s): CHOL, HDL, LDLCALC, TRIG, CHOLHDL, LDLDIRECT in the last 72 hours. Thyroid Function Tests: No results for input(s): TSH, T4TOTAL, FREET4, T3FREE, THYROIDAB in the last 72 hours. Anemia Panel: No results for input(s): VITAMINB12, FOLATE, FERRITIN, TIBC, IRON , RETICCTPCT in the last 72 hours. Sepsis Labs: No results for input(s): PROCALCITON, LATICACIDVEN in the last 168 hours.  Recent Results (from the past 240 hours)  MRSA Next Gen by PCR, Nasal     Status: Abnormal   Collection  Time: 06/28/24 10:30 PM   Specimen: Nasal Mucosa; Nasal Swab  Result Value Ref Range Status   MRSA by PCR Next Gen DETECTED (A) NOT DETECTED Final    Comment: (NOTE) The GeneXpert MRSA Assay (FDA approved for NASAL specimens only), is one component of a comprehensive MRSA colonization surveillance program. It is not intended to diagnose MRSA infection nor to guide or monitor treatment for MRSA infections. Test performance is not FDA approved in patients less than 43 years old. Performed at [facility], [address], [place], [place] [address]     Radiology Studies: DG CHEST PORT 1 VIEW Result Date: 06/30/2024 EXAM: 1 VIEW XRAY OF THE CHEST 06/30/2024 01:23:00 PM COMPARISON: 06/30/2024 CLINICAL HISTORY: Encounter for chest tube removal [id number]. FINDINGS: LINES, TUBES AND DEVICES: Interval removal of right-sided pleural drainage catheter. LUNGS AND PLEURA: No pneumothorax. Low lung volume. Chronic increased interstitial markings throughout bilateral lungs. Blunting of left costophrenic angle, stable. HEART AND MEDIASTINUM: Unchanged heart size and mediastinal contours. BONES AND SOFT TISSUES: No acute osseous abnormality. IMPRESSION: 1. Interval removal of right-sided pleural drainage catheter. No pneumothorax. Electronically signed by: Andrea Gasman MD 06/30/2024 03:13 PM EDT RP Workstation: [id number]   DG CHEST PORT 1 VIEW Result Date: 06/30/2024 EXAM: 1 VIEW XRAY OF THE CHEST 06/30/2024 04:55:00 AM COMPARISON: 06/29/2024 CLINICAL HISTORY: Pneumothorax, right pneumothorax FINDINGS: LINES, TUBES AND DEVICES: Stable right basilar pigtail chest tube. LUNGS AND PLEURA: No pneumothorax. Low lung volumes persist. Diffuse interstitial opacities, slightly progressed from prior. HEART AND MEDIASTINUM: Unchanged heart size and mediastinal contours. BONES AND SOFT TISSUES: No acute osseous abnormality. IMPRESSION: 1. Stable right basilar pigtail chest tube. No pneumothorax. 2. Low  lung volumes and diffuse interstitial opacities, slightly progressed from prior. 3. Possible small bilateral pleural effusions. Electronically signed by: Andrea Gasman MD 06/30/2024 01:06 PM EDT RP Workstation: [id number]   Scheduled Meds:  arformoterol   15 mcg Nebulization BID   budesonide  (PULMICORT ) nebulizer solution  0.25 mg Nebulization BID   heparin  injection (subcutaneous)  5,000 Units Subcutaneous Q8H   ipratropium  0.5 mg Nebulization Q6H   levalbuterol   0.63 mg Nebulization Q6H   metoCLOPramide  (REGLAN ) injection  10 mg Intravenous Q8H   mupirocin  ointment  1 Application Nasal BID   senna  2 tablet Oral Daily   Continuous Infusions:   LOS: 2 days   Alejandro Marker, DO [facility] Available via Epic secure chat 7am-7pm After these hours, please refer to coverage provider  listed on [email or web address] 07/01/2024, 1:18 PM

## 2024-07-01 NOTE — Consult Note (Addendum)
 NAME:  Matthew [name], MRN:  [id number], DOB:  1966/09/28, LOS: 2 ADMISSION DATE:  06/28/2024, CONSULTATION DATE:  07/01/2024 REFERRING MD:  Matthew [name], CHIEF COMPLAINT:  hypoxia   History of Present Illness:  Matthew [name] is a 58 y/o M with a PMH significant for anxiety, CAD, hx of RCA stent placement, GERD, obesity, restrictive lung disease (PFTs 11/2022) who underwent elective robotic assisted partial nephrectomy due to a large right renal cystic mass with post operative course c/b R pneumothorax now resolved. TRH consulting us  for acute hypoxic respiratory failure.  The patient was admitted on 06/28/2024 by urology service for elective robotic assisted partial nephrectomy. Post operative course was c/b R pneumothorax. Chest tube placed and managed by urology. Pneumothorax resolved. The patient started to develop new hypoxia since hospital admission and is now on 15 L O2 by HFNC. He notes that he does not feel any dyspnea but has a productive cough of whitish sputum. He denies having any fevers, chills, or night sweats. He has no chest discomfort. He notes some worsening lower extremity swelling. He is well known to our pulmonary clinic and has seen Matthew [name] as an OP. He met him when he had COVID in 2022 and was given systemic steroids to treat post COVID organizing pneumonia and possible bland DAH given hx of hemoptysis. He currently only uses Spiriva  2 puffs daily and albuterol  as needed. He is on prednisone  10 mg daily at home.    I reviewed CXR performed today 9/20 independently and it looks like patient has bilateral pulmonary infiltrates and vascular congestion concerning for pulmonary edema. CT chest 04/2024 shows upper lobe emphysematous changes as well as some upper lobe irregular honeycombing and lower lobe bronchiectasis and mosaic attenuation.   Pertinent  Medical History  Post COVID pulmonary fibrosis Emphysema CAD Large R kidney mass s/p nephrectomy Iatrogenic PTX - now  resolved.  Significant Hospital Events: Including procedures, antibiotic start and stop dates in addition to other pertinent events   Admitted 9/17, partial nephrectomy, c/b pneumothorax Worsening hypoxic respiratory failure, now on 15 L O2 by [place]  Interim History / Subjective:  As above  Objective    Blood pressure (!) 142/94, pulse 100, temperature 98.2 F (36.8 C), temperature source Oral, resp. rate 18, height 5' 11 (1.803 m), weight 109.7 kg, SpO2 90%.        Intake/Output Summary (Last 24 hours) at 07/01/2024 1503 Last data filed at 07/01/2024 1100 Gross per 24 hour  Intake 240 ml  Output 1000 ml  Net -760 ml   Filed Weights   06/28/24 1042  Weight: 109.7 kg   I/O + 676 mL since admit  Examination: General: well appearing, no acute distress HENT: anicteric sclera, well injected conjunctivae, oral and nasal mucosa normal, JVD 12 cm H2O Lungs: Bilateral inspiratory crackles from mid to lower lung fields as well as scattered pulmonary squeaks Cardiovascular: tachycardic, no r/m/g Abdomen: surgical site appears c/d/I, mildly tender to palpation at surgical incision site Extremities: 1+ pitting edema Neuro: awake, alert, oriented x 3 GU: Deferred  Resolved problem list   Assessment and Plan   #Acute Hypoxic Respiratory Failure: #Post COVID ILD: #Emphysema, COPD grade 1, class B: The patient appears hypervolemic on examination and CXR concerning for developing pulmonary edema as well as a new left lower lobe pulmonary infiltrates. Differential diagnosis includes pulmonary edema and pneumonia. MRSA swab +. Therefore, I would recommend pursuing diuresis and to start antimicrobial for pneumonia. - Send sputum culture - Linezolid  +  Zosyn  started, de-escalate based on sputum culture - Agree with lasix  60 mg IVP x 1, continue to re-dose as appropriate - Ordered BNP and echocardiogram - Continue LAMA/LABA, can d/c ICS since patient is on systemic steroids - Consider  tapering systemic steroids quickly - Monitor electrolytes and keep K > 4 and Mg > 2 - Continue IS and flutter valve - OOB to chair - Early mobility with PT/OT - Wean HFNC as tolerated - If patient is not clinically improving with current plan in next 24 - 48 hours, will consider obtaining CTA chest to look at lung parenchyma in more detail as well as rule out PE in the setting of recent surgery  #Iatrogenic PTX: resolved.  Labs   CBC: Recent Labs  Lab 06/28/24 1521 06/29/24 0309 07/01/24 0925  WBC  --   --  15.3*  NEUTROABS  --   --  12.4*  HGB 16.8 17.3* 16.6  HCT 54.5* 53.7* 52.7*  MCV  --   --  94.1  PLT  --   --  218    Basic Metabolic Panel: Recent Labs  Lab 06/29/24 0309 06/30/24 0347 07/01/24 0925  NA 133* 135 134*  K 5.2* 4.5 4.5  CL 97* 100 98  CO2 24 25 25   GLUCOSE 151* 127* 91  BUN 12 14 17   CREATININE 1.13 0.87 0.98  CALCIUM  8.7* 8.5* 9.0  MG  --  2.6* 2.5*  PHOS  --  2.6 3.2   GFR: Estimated Creatinine Clearance: 102.3 mL/min (by C-G formula based on SCr of 0.98 mg/dL). Recent Labs  Lab 07/01/24 0925  WBC 15.3*    Liver Function Tests: Recent Labs  Lab 07/01/24 0925  AST 30  ALT 61*  ALKPHOS 101  BILITOT 1.1  PROT 6.9  ALBUMIN  3.4*   No results for input(s): LIPASE, AMYLASE in the last 168 hours. No results for input(s): AMMONIA in the last 168 hours.  ABG    Component Value Date/Time   PHART 7.464 (H) 09/26/2020 2035   PCO2ART 31.7 (L) 09/26/2020 2035   PO2ART 90 09/26/2020 2035   HCO3 22.8 09/26/2020 2035   TCO2 24 09/26/2020 2035   ACIDBASEDEF 0.8 11/17/2019 1618   O2SAT 98.0 09/26/2020 2035     Coagulation Profile: No results for input(s): INR, PROTIME in the last 168 hours.  Cardiac Enzymes: No results for input(s): CKTOTAL, CKMB, CKMBINDEX, TROPONINI in the last 168 hours.  HbA1C: No results found for: HGBA1C  CBG: No results for input(s): GLUCAP in the last 168 hours.  Review of Systems:    Not obtained  Past Medical History:  He,  has a past medical history of Anxiety, Coronary artery disease (per pt last cardiology visit 2011 (found documentation 12-05-2009 w/ dr malva with [facility] in epic)/  currently pt is followed by pcp), GERD (gastroesophageal reflux disease), Hyperlipidemia, Hypertension, Olecranon bursitis of left elbow, Respiratory failure (HCC), S/P right coronary artery (RCA) stent placement (12/28/2008), and Wears dentures.   Surgical History:   Past Surgical History:  Procedure Laterality Date   CARDIAC CATHETERIZATION  12-09-2009  dr annis   abnormal stress test (12-05-2009) widely patent coronaries present w/ evidence of diffuse atherosclerotic disease/  widely patent stented segments in the proximal and mid segment RCA/  LVEF 65%   CORONARY ANGIOPLASTY WITH STENT PLACEMENT  12-28-2008  dr berry   PCI and stenting of the proxmial and mid RCA using cutting balloon atherectomy (x2 DES, Xience)/  LVEF >60% w/ normal wall motion  ESOPHAGOGASTRODUODENOSCOPY N/A 11/17/2019   Procedure: ESOPHAGOGASTRODUODENOSCOPY (EGD);  Surgeon: Rollin Dover, MD;  Location: [facility];  Service: Gastroenterology;  Laterality: N/A;   KNEE ARTHROSCOPY W/ MENISCECTOMY Right 2002   NEUROPLASTY / TRANSPOSITION ULNAR NERVE AT ELBOW Right 10/2014   dr shari CONNORS BURSECTOMY Left 05/06/2017   Procedure: LEFT ELBOW OLECRANON BURSA EXCISION;  Surgeon: shari Easter, MD;  Location: [facility] ;  Service: Orthopedics;  Laterality: Left;   ORIF RIGHT ANKLE FX  2006   ROBOTIC ASSITED PARTIAL NEPHRECTOMY Right 06/28/2024   Procedure: NEPHRECTOMY, PARTIAL, ROBOT-ASSISTED;  Surgeon: Alvaro Ricardo KATHEE Mickey., MD;  Location: [facility];  Service: Urology;  Laterality: Right;   TONSILLECTOMY  age 34   TRANSTHORACIC ECHOCARDIOGRAM  03/30/2016   mild concentric LVH, ef 50-55%/  trivial TR     Social History:   reports that he has been smoking cigarettes. He started smoking about 46  years ago. He has a 84.2 pack-year smoking history. He has never used smokeless tobacco. He reports current alcohol use of about 1.0 standard drink of alcohol per week. He reports that he does not use drugs.   Family History:  His family history includes Cancer in his father and mother.   Allergies Allergies  Allergen Reactions   Codeine Anaphylaxis and Nausea And Vomiting   Onion Anaphylaxis and Swelling    thoart swells up   Bee Venom Hives     Home Medications  Prior to Admission medications   Medication Sig Start Date End Date Taking? Authorizing Provider  aspirin  EC 81 MG tablet Take 81 mg by mouth daily.   Yes [provider]  docusate sodium  (COLACE) 100 MG capsule Take 1 capsule (100 mg total) by mouth 2 (two) times daily. 06/28/24  Yes Dancy, Alan, PA-C  loratadine-pseudoephedrine (CLARITIN-D 24-HOUR) 10-240 MG 24 hr tablet Take 1 tablet by mouth daily.   Yes [provider]  predniSONE  (DELTASONE ) 10 MG tablet Take 10 mg by mouth as directed.   Yes [provider]  SPIRIVA  RESPIMAT 2.5 MCG/ACT AERS INHALE 2 PUFFS DAILY 02/17/24  Yes Geronimo Amel, MD  traMADol  (ULTRAM ) 50 MG tablet Take 1-2 tablets (50-100 mg total) by mouth every 6 (six) hours as needed for moderate pain (pain score 4-6) or severe pain (pain score 7-10). 06/28/24  Yes Dancy, Alan, PA-C  vitamin B-12 (CYANOCOBALAMIN) 1000 MCG tablet Take 1,000 mcg by mouth 3 (three) times a week.   Yes [provider]  albuterol  (VENTOLIN  HFA) 108 (90 Base) MCG/ACT inhaler INHALE 2 PUFFS INTO THE LUNGS EVERY SIX HOURS AS NEEDED FOR WHEEZING OR SHORTNESS OF BREATH. 11/15/20   Mikhail, Maryann, DO     Thank you for this interesting consult. I have spent 60 minutes evaluating patient, reviewing chart, and discussing plan of care with patient, family (wife - yemen), and primary medical team (Dr. Carlye). If you have any questions or concerns please reach out to me via pager  [phone]).  Paula Southerly, MD [facility]

## 2024-07-01 NOTE — Evaluation (Signed)
 Physical Therapy Evaluation Patient Details Name: Matthew [name] MRN: [id number] DOB: July 15, 1966 Today's Date: 07/01/2024  History of Present Illness  58 yo male admitted with renal cyst. 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 Imaging (+) pneumothorax-chest tube placement 9/17. Acute respiratory failure with hypoxia. Hx of CAD, CHF, COVID, anxiety, RCA stent.  Clinical Impression  On eval, pt was Supv level for mobility. He took a few steps around the room on 15L HFNC (O2 tank carrier not available for use at time of eval). He tolerated activity well. Denied dyspnea.  O2 readings: PT pulse ox: 90% 15L rest, 88% 15L with activity Bedside pulse ox: 87% 15L rest, 84% 15L with activity Do not anticipate any f/u PT needs after discharge, at this time. Will continue to follow pt during hospital stay. Recommend daily ambulation with either nursing and/or mobility team as able.        If plan is discharge home, recommend the following:     Can travel by private vehicle        Equipment Recommendations None recommended by PT  Recommendations for Other Services       Functional Status Assessment Patient has had a recent decline in their functional status and demonstrates the ability to make significant improvements in function in a reasonable and predictable amount of time.     Precautions / Restrictions Precautions Precaution/Restrictions Comments: monitor O2 Restrictions Weight Bearing Restrictions Per Provider Order: No      Mobility  Bed Mobility Overal bed mobility: Needs Assistance Bed Mobility: Supine to Sit     Supine to sit: Supervision, HOB elevated     General bed mobility comments: Supv for lines    Transfers Overall transfer level: Needs assistance Equipment used: None Transfers: Sit to/from Stand Sit to Stand: Supervision           General transfer comment: Supv for safety, lines    Ambulation/Gait Ambulation/Gait assistance: Supervision Gait Distance  (Feet): 5 Feet Assistive device: None         General Gait Details: No O2 carrier available on floor at time of eval. Pt took a few steps at bedside. Tolerated well. No LOB. O2 88% on 15L (PT pulse ox), 84% 15L on bedside continuous pulse ox. Pt denied dyspnea  Stairs            Wheelchair Mobility     Tilt Bed    Modified Rankin (Stroke Patients Only)       Balance Overall balance assessment: No apparent balance deficits (not formally assessed)                                           Pertinent Vitals/Pain Pain Assessment Pain Assessment: No/denies pain    Home Living Family/patient expects to be discharged to:: Private residence Living Arrangements: Spouse/significant other Available Help at Discharge: Family;Available 24 hours/day Type of Home: House Home Access: Stairs to enter Entrance Stairs-Rails: Right Entrance Stairs-Number of Steps: 4   Home Layout: One level Home Equipment: None      Prior Function Prior Level of Function : Independent/Modified Independent               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     Extremity/Trunk Assessment   Upper Extremity Assessment Upper Extremity Assessment: Defer to OT evaluation    Lower Extremity Assessment Lower Extremity Assessment: Overall WFL for tasks assessed  Cervical / Trunk Assessment Cervical / Trunk Assessment: Normal  Communication   Communication Communication: No apparent difficulties    Cognition Arousal: Alert Behavior During Therapy: WFL for tasks assessed/performed   PT - Cognitive impairments: No apparent impairments                         Following commands: Intact       Cueing Cueing Techniques: Verbal cues     General Comments      Exercises     Assessment/Plan    PT Assessment Patient needs continued PT services  PT Problem List Cardiopulmonary status limiting activity;Decreased mobility;Decreased activity  tolerance       PT Treatment Interventions Gait training;Functional mobility training;Therapeutic activities;Therapeutic exercise;Patient/family education;Balance training    PT Goals (Current goals can be found in the Care Plan section)  Acute Rehab PT Goals Patient Stated Goal: home soon! PT Goal Formulation: With patient Time For Goal Achievement: 07/15/24 Potential to Achieve Goals: Good    Frequency Min 2X/week     Co-evaluation               AM-PAC PT 6 Clicks Mobility  Outcome Measure Help needed turning from your back to your side while in a flat bed without using bedrails?: None Help needed moving from lying on your back to sitting on the side of a flat bed without using bedrails?: None Help needed moving to and from a bed to a chair (including a wheelchair)?: None Help needed standing up from a chair using your arms (e.g., wheelchair or bedside chair)?: None Help needed to walk in hospital room?: A Little Help needed climbing 3-5 steps with a railing? : A Little 6 Click Score: 22    End of Session Equipment Utilized During Treatment: Oxygen Activity Tolerance: Patient tolerated treatment well Patient left: with call bell/phone within reach;in chair;with chair alarm set   PT Visit Diagnosis: Difficulty in walking, not elsewhere classified (R26.2)    Time: 8498-8475 PT Time Calculation (min) (ACUTE ONLY): 23 min   Charges:   PT Evaluation $PT Eval Low Complexity: 1 Low PT Treatments $Gait Training: 8-22 mins PT General Charges $$ ACUTE PT VISIT: 1 Visit          Dannial SQUIBB, PT Acute Rehabilitation  Office: [phone]

## 2024-07-01 NOTE — Progress Notes (Signed)
 Walked pt in the hallway (350 ft). O2 sat while walking ranges 86-92% on HFNC 10 L/M. Kept patient aware that his O2 sat drops while walking but he said he is fine. No shortness of breath, dizziness, lightheadedness, or chest pain while walking. No complaints of pain. Assisted back in the bed. Will continue to monitor.

## 2024-07-01 NOTE — Progress Notes (Signed)
 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    Intv/Subj: No acute events overnight. Patient is without complaint. No emesis or nausea overnight.  Chest tube removed yesterday. Persistent O2 requirement -hospitalist consulted  Principal Problem:   Renal cyst Active Problems:   Tobacco abuse   CAD (coronary artery disease)   Obesity (BMI 30.0-34.9)   Pulmonary emphysema, unspecified emphysema type (HCC)   Essential hypertension   Hyperlipidemia   Lung nodule   Pneumothorax on right   Hyperkalemia  Current Facility-Administered Medications  Medication Dose Route Frequency Provider Last Rate Last Admin   albuterol  (PROVENTIL ) (2.5 MG/3ML) 0.083% nebulizer solution 3 mL  3 mL Inhalation Q6H PRN Cory Palma, PA-C   3 mL at 06/30/24 0613   alum & mag hydroxide-simeth (MAALOX/MYLANTA) 200-200-20 MG/5ML suspension 30 mL  30 mL Oral Q4H PRN Celinda Alm Lot, MD       arformoterol  (BROVANA ) nebulizer solution 15 mcg  15 mcg Nebulization BID Sheikh, Omair Latif, DO       budesonide  (PULMICORT ) nebulizer solution 0.25 mg  0.25 mg Nebulization BID Sheikh, Omair Latif, DO       diphenhydrAMINE  (BENADRYL ) injection 12.5-25 mg  12.5-25 mg Intravenous Q6H PRN Cory Palma, PA-C       Or   diphenhydrAMINE  (BENADRYL ) 12.5 MG/5ML elixir 12.5-25 mg  12.5-25 mg Oral Q6H PRN Dancy, Amanda, PA-C       heparin  injection 5,000 Units  5,000 Units Subcutaneous Q8H Alvaro Theodore B Jr., MD   5,000 Units at 07/01/24 9377   HYDROmorphone  (DILAUDID ) injection 0.5-1 mg  0.5-1 mg Intravenous Q2H PRN Cory Palma, PA-C   1 mg at 07/01/24 0305   hyoscyamine  (LEVSIN  SL) SL tablet 0.125-0.25 mg  0.125-0.25 mg Sublingual Q4H PRN Cory Palma,  PA-C       ipratropium (ATROVENT ) nebulizer solution 0.5 mg  0.5 mg Nebulization Q6H Sheikh, Omair Latif, DO       levalbuterol  (XOPENEX ) nebulizer solution 0.63 mg  0.63 mg Nebulization Q6H Sheikh, Omair Latif, DO       metoCLOPramide  (REGLAN ) injection 10 mg  10 mg Intravenous Q8H Sheikh, Omair Beclabito, DO   10 mg at 07/01/24 9377   mupirocin  ointment (BACTROBAN ) 2 % 1 Application  1 Application Nasal BID Alvaro Ricardo KATHEE Mickey., MD   1 Application at 07/01/24 1100   neomycin-bacitracin-polymyxin [phone] OINT 1 Application  1 Application Topical TID PRN Cory Palma, PA-C       ondansetron  (ZOFRAN ) injection 4 mg  4 mg Intravenous Q4H PRN Cory Palma, PA-C   4 mg at 06/29/24 1932   Oral care mouth rinse  15 mL Mouth Rinse PRN Alvaro Ricardo KATHEE Mickey., MD       prochlorperazine  (COMPAZINE ) injection 10 mg  10 mg Intravenous Q6H PRN Chavez, Abigail, NP   10 mg at 06/29/24 2151   senna (SENOKOT) tablet 17.2 mg  2 tablet Oral Daily Manny, Theodore B Jr., MD   17.2 mg at 07/01/24 1100   traMADol  (ULTRAM ) tablet 50-100 mg  50-100 mg Oral Q6H PRN Cory Palma, PA-C   100 mg at 06/30/24 2101     Objective: Vital: Vitals:   06/30/24 1345 06/30/24 2038 07/01/24 0436 07/01/24 0725  BP: 122/74 124/78 116/71   Pulse: 94 88 97  Resp: 19 (!) 24 20   Temp: 97.6 F (36.4 C) 98.1 F (36.7 C) 98.2 F (36.8 C)   TempSrc: Oral Oral Oral   SpO2: 93% 92% 91% 90%  Weight:      Height:       I/Os: I/O last 3 completed shifts: In: 1178.3 [P.O.:240; I.V.:938.3] Out: 1730 [Urine:1675; Drains:45; Chest Tube:10]  Physical Exam:  General: Patient is in no apparent distress Lungs: Normal respiratory effort, chest expands symmetrically.  On high flow nasal cannula at 12 L GI: Incisions are c/d/i.  Ext: lower extremities symmetric  Lab Results: Recent Labs    06/28/24 1521 06/29/24 0309 07/01/24 0925  WBC  --   --  15.3*  HGB 16.8 17.3* 16.6  HCT 54.5* 53.7* 52.7*   Recent Labs     06/29/24 0309 06/30/24 0347 07/01/24 0925  NA 133* 135 134*  K 5.2* 4.5 4.5  CL 97* 100 98  CO2 24 25 25   GLUCOSE 151* 127* 91  BUN 12 14 17   CREATININE 1.13 0.87 0.98  CALCIUM  8.7* 8.5* 9.0   No results for input(s): LABPT, INR in the last 72 hours. No results for input(s): LABURIN in the last 72 hours. Results for orders placed or performed during the hospital encounter of 06/28/24  MRSA Next Gen by PCR, Nasal     Status: Abnormal   Collection Time: 06/28/24 10:30 PM   Specimen: Nasal Mucosa; Nasal Swab  Result Value Ref Range Status   MRSA by PCR Next Gen DETECTED (A) NOT DETECTED Final    Comment: (NOTE) The GeneXpert MRSA Assay (FDA approved for NASAL specimens only), is one component of a comprehensive MRSA colonization surveillance program. It is not intended to diagnose MRSA infection nor to guide or monitor treatment for MRSA infections. Test performance is not FDA approved in patients less than 67 years old. Performed at [facility], [address], [place], [place] [address]     Studies/Results: DG CHEST PORT 1 VIEW Result Date: 06/30/2024 EXAM: 1 VIEW XRAY OF THE CHEST 06/30/2024 01:23:00 PM COMPARISON: 06/30/2024 CLINICAL HISTORY: Encounter for chest tube removal [id number]. FINDINGS: LINES, TUBES AND DEVICES: Interval removal of right-sided pleural drainage catheter. LUNGS AND PLEURA: No pneumothorax. Low lung volume. Chronic increased interstitial markings throughout bilateral lungs. Blunting of left costophrenic angle, stable. HEART AND MEDIASTINUM: Unchanged heart size and mediastinal contours. BONES AND SOFT TISSUES: No acute osseous abnormality. IMPRESSION: 1. Interval removal of right-sided pleural drainage catheter. No pneumothorax. Electronically signed by: Andrea Gasman MD 06/30/2024 03:13 PM EDT RP Workstation: [id number]   DG CHEST PORT 1 VIEW Result Date: 06/30/2024 EXAM: 1 VIEW XRAY OF THE CHEST 06/30/2024 04:55:00 AM COMPARISON:  06/29/2024 CLINICAL HISTORY: Pneumothorax, right pneumothorax FINDINGS: LINES, TUBES AND DEVICES: Stable right basilar pigtail chest tube. LUNGS AND PLEURA: No pneumothorax. Low lung volumes persist. Diffuse interstitial opacities, slightly progressed from prior. HEART AND MEDIASTINUM: Unchanged heart size and mediastinal contours. BONES AND SOFT TISSUES: No acute osseous abnormality. IMPRESSION: 1. Stable right basilar pigtail chest tube. No pneumothorax. 2. Low lung volumes and diffuse interstitial opacities, slightly progressed from prior. 3. Possible small bilateral pleural effusions. Electronically signed by: Andrea Gasman MD 06/30/2024 01:06 PM EDT RP Workstation: [id number]    Assessment: Procedure(s): NEPHRECTOMY, PARTIAL, ROBOT-ASSISTED, 3 Days Post-Op  doing well. Persistent O2 requirement after resolution of pneumothorax.  Hospitalist has been consulted and the patient was given several breathing treatments. Standard postop pain.  Ileus has resolved  Plan: Respiratory therapy consult  for chest PT and any additional breathing treatments as necessary. Continue routine postop care Advance diet to regular Will remain in house until his O2 requirement is in the mid to 4 L or less.  If by tomorrow morning he has not better, would likely consult pulmonology for further suggestions.  Appreciate the hospital medicine input and treatment.   Morene Salines, MD Urology 07/01/2024, 12:06 PM

## 2024-07-02 ENCOUNTER — Inpatient Hospital Stay (HOSPITAL_COMMUNITY)

## 2024-07-02 ENCOUNTER — Telehealth: Payer: Self-pay | Admitting: Pulmonary Disease

## 2024-07-02 DIAGNOSIS — I509 Heart failure, unspecified: Secondary | ICD-10-CM

## 2024-07-02 LAB — COMPREHENSIVE METABOLIC PANEL WITH GFR
ALT: 51 U/L — ABNORMAL HIGH (ref 0–44)
AST: 26 U/L (ref 15–41)
Albumin: 3.6 g/dL (ref 3.5–5.0)
Alkaline Phosphatase: 111 U/L (ref 38–126)
Anion gap: 14 (ref 5–15)
BUN: 21 mg/dL — ABNORMAL HIGH (ref 6–20)
CO2: 27 mmol/L (ref 22–32)
Calcium: 9.4 mg/dL (ref 8.9–10.3)
Chloride: 94 mmol/L — ABNORMAL LOW (ref 98–111)
Creatinine, Ser: 1.08 mg/dL (ref 0.61–1.24)
GFR, Estimated: >60 mL/min (ref >60)
Glucose, Bld: 142 mg/dL — ABNORMAL HIGH (ref 70–99)
Potassium: 5 mmol/L (ref 3.5–5.1)
Sodium: 134 mmol/L — ABNORMAL LOW (ref 135–145)
Total Bilirubin: 1.1 mg/dL (ref 0.0–1.2)
Total Protein: 7.6 g/dL (ref 6.5–8.1)

## 2024-07-02 LAB — CBC WITH DIFFERENTIAL/PLATELET
Abs Immature Granulocytes: 0.05 K/uL (ref 0.00–0.07)
Basophils Absolute: 0 K/uL (ref 0.0–0.1)
Basophils Relative: 0 %
Eosinophils Absolute: 0 K/uL (ref 0.0–0.5)
Eosinophils Relative: 0 %
HCT: 53.7 % — ABNORMAL HIGH (ref 39.0–52.0)
Hemoglobin: 17.3 g/dL — ABNORMAL HIGH (ref 13.0–17.0)
Immature Granulocytes: 0 %
Lymphocytes Relative: 6 %
Lymphs Abs: 0.7 K/uL (ref 0.7–4.0)
MCH: 29.8 pg (ref 26.0–34.0)
MCHC: 32.2 g/dL (ref 30.0–36.0)
MCV: 92.6 fL (ref 80.0–100.0)
Monocytes Absolute: 0.4 K/uL (ref 0.1–1.0)
Monocytes Relative: 4 %
Neutro Abs: 10.1 K/uL — ABNORMAL HIGH (ref 1.7–7.7)
Neutrophils Relative %: 90 %
Platelets: 238 K/uL (ref 150–400)
RBC: 5.8 MIL/uL (ref 4.22–5.81)
RDW: 14.4 % (ref 11.5–15.5)
WBC: 11.3 K/uL — ABNORMAL HIGH (ref 4.0–10.5)
nRBC: 0.3 % — ABNORMAL HIGH (ref 0.0–0.2)

## 2024-07-02 LAB — RESPIRATORY PANEL BY PCR

## 2024-07-02 LAB — ECHOCARDIOGRAM COMPLETE
Area-P 1/2: 4.01 cm2
Calc EF: 65.2 %
Height: 71 in
S' Lateral: 2.7 cm
Single Plane A2C EF: 64.4 %
Single Plane A4C EF: 65 %
Weight: 3869.97 [oz_av]

## 2024-07-02 LAB — SURGICAL PATHOLOGY

## 2024-07-02 LAB — PHOSPHORUS: Phosphorus: 3.3 mg/dL (ref 2.5–4.6)

## 2024-07-02 LAB — MAGNESIUM: Magnesium: 2.9 mg/dL — ABNORMAL HIGH (ref 1.7–2.4)

## 2024-07-02 MED ORDER — PANTOPRAZOLE SODIUM 40 MG IV SOLR
40.0000 mg | Freq: Once | INTRAVENOUS | Status: AC
  Administered 2024-07-02: 40 mg via INTRAVENOUS
  Filled 2024-07-02: qty 10

## 2024-07-02 MED ORDER — LINEZOLID 600 MG PO TABS: 600.0000 mg | ORAL_TABLET | Freq: Two times a day (BID) | ORAL | 0 refills | Status: AC

## 2024-07-02 MED ORDER — PREDNISONE 10 MG PO TABS: ORAL_TABLET | ORAL | 0 refills | Status: AC

## 2024-07-02 MED ORDER — LEVOFLOXACIN 750 MG PO TABS: 750.0000 mg | ORAL_TABLET | Freq: Every day | ORAL | 0 refills | Status: AC

## 2024-07-02 MED ORDER — FUROSEMIDE 10 MG/ML IJ SOLN
60.0000 mg | Freq: Once | INTRAMUSCULAR | Status: AC
  Administered 2024-07-02: 60 mg via INTRAVENOUS
  Filled 2024-07-02: qty 6

## 2024-07-02 NOTE — Progress Notes (Signed)
  Echocardiogram 2D Echocardiogram has been performed.  Norleen ORN [facility] 07/02/2024, 10:17 AM

## 2024-07-02 NOTE — Progress Notes (Addendum)
 SATURATION QUALIFICATIONS: (This note is used to comply with regulatory documentation for home oxygen)  Patient Saturations on Room Air at Rest = 86%  Patient Saturations on Room Air while Ambulating = 84%  Patient Saturations on 5 Liters of oxygen while Ambulating = 90%  Please briefly explain why patient needs home oxygen:  Pt dropped below 88 while on 4 l. Needed to increase to 5 while ambulating to keep oxygen greater than 88%

## 2024-07-02 NOTE — Progress Notes (Signed)
 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    Intv/Subj: No acute events overnight. Advance to regular diet, denies any nausea or vomiting. Pulmonology consulted yesterday, echo was performed today to evaluate for heart failure as etiology of his persistent O2 requirement.  Several other sputum tests were sent which appear to be negative.  He has also received nebulizers and breathing treatments. Working with physical therapy.  Principal Problem:   Renal cyst Active Problems:   Tobacco abuse   CAD (coronary artery disease)   Obesity (BMI 30.0-34.9)   Pulmonary emphysema, unspecified emphysema type (HCC)   Essential hypertension   Hyperlipidemia   Lung nodule   Pneumothorax on right   Hyperkalemia  Current Facility-Administered Medications  Medication Dose Route Frequency Provider Last Rate Last Admin   albuterol  (PROVENTIL ) (2.5 MG/3ML) 0.083% nebulizer solution 3 mL  3 mL Inhalation Q6H PRN Cory Palma, PA-C   3 mL at 06/30/24 0613   alum & mag hydroxide-simeth (MAALOX/MYLANTA) 200-200-20 MG/5ML suspension 30 mL  30 mL Oral Q4H PRN Celinda Alm Lot, MD       arformoterol  (BROVANA ) nebulizer solution 15 mcg  15 mcg Nebulization BID Sheikh, Omair Latif, DO   15 mcg at 07/02/24 9085   diphenhydrAMINE  (BENADRYL ) injection 12.5-25 mg  12.5-25 mg Intravenous Q6H PRN Cory Palma, PA-C       Or   diphenhydrAMINE  (BENADRYL ) 12.5 MG/5ML elixir 12.5-25 mg  12.5-25 mg Oral Q6H PRN Cory Palma, PA-C       furosemide  (LASIX ) injection 60 mg  60 mg Intravenous Once Alghanim, Fahid, MD       heparin  injection 5,000 Units  5,000 Units Subcutaneous Q8H Manny, Theodore B Jr., MD   5,000 Units at 07/02/24 0559    HYDROmorphone  (DILAUDID ) injection 0.5-1 mg  0.5-1 mg Intravenous Q2H PRN Cory Palma, PA-C   1 mg at 07/01/24 0305   hyoscyamine  (LEVSIN  SL) SL tablet 0.125-0.25 mg  0.125-0.25 mg Sublingual Q4H PRN Cory Palma, PA-C       linezolid  (ZYVOX ) IVPB 600 mg  600 mg Intravenous Q12H Alghanim, Fahid, MD 300 mL/hr at 07/02/24 1106 600 mg at 07/02/24 1106   methylPREDNISolone  sodium succinate (SOLU-MEDROL ) 40 mg/mL injection 40 mg  40 mg Intravenous Daily Alghanim, Fahid, MD   40 mg at 07/02/24 1100   metoCLOPramide  (REGLAN ) injection 10 mg  10 mg Intravenous Q8H Sheikh, Omair Latif, DO   10 mg at 07/02/24 0559   mupirocin  ointment (BACTROBAN ) 2 % 1 Application  1 Application Nasal BID Alvaro Ricardo KATHEE Mickey., MD   1 Application at 07/02/24 1100   neomycin-bacitracin-polymyxin [phone] OINT 1 Application  1 Application Topical TID PRN Cory Palma, PA-C       ondansetron  (ZOFRAN ) injection 4 mg  4 mg Intravenous Q4H PRN Cory Palma, PA-C   4 mg at 06/29/24 1932   Oral care mouth rinse  15 mL Mouth Rinse PRN Alvaro Ricardo KATHEE Mickey., MD       piperacillin -tazobactam (ZOSYN ) IVPB 3.375 g  3.375 g Intravenous Q8H Alghanim, Fahid, MD 12.5 mL/hr at 07/02/24 0345 3.375 g at 07/02/24 0345   prochlorperazine  (COMPAZINE ) injection 10 mg  10 mg Intravenous Q6H PRN Chavez, Abigail, NP   10 mg at 06/29/24 2151   revefenacin  (YUPELRI ) nebulizer solution 175 mcg  175 mcg Nebulization Daily Alghanim, Fahid, MD   175 mcg at 07/02/24 0918   senna (SENOKOT) tablet 17.2 mg  2 tablet Oral Daily Manny, Theodore B Jr., MD   17.2 mg at 07/02/24 1100   traMADol  (ULTRAM ) tablet 50-100 mg  50-100 mg Oral Q6H PRN Cory Palma, PA-C   100 mg at 06/30/24 2101     Objective: Vital: Vitals:   07/01/24 1950 07/01/24 2005 07/02/24 0424 07/02/24 0914  BP: (!) 141/88  125/79   Pulse: 97  85   Resp: (!) 24  18   Temp: 98.6 F (37 C)  (!) 97.5 F (36.4 C)   TempSrc: Oral  Oral   SpO2: 92% 92% 90% 90%  Weight:      Height:        I/Os: I/O last 3 completed shifts: In: 971.9 [P.O.:600; IV Piggyback:371.9] Out: 2875 [Urine:2875]  Physical Exam:  General: Patient is in no apparent distress Lungs: Normal respiratory effort, chest expands symmetrically.  On high flow nasal cannula at 10 L GI: Incisions are c/d/i.  Ext: lower extremities symmetric  Lab Results: Recent Labs    07/01/24 0925 07/02/24 0422  WBC 15.3* 11.3*  HGB 16.6 17.3*  HCT 52.7* 53.7*   Recent Labs    06/30/24 0347 07/01/24 0925 07/02/24 0422  NA 135 134* 134*  K 4.5 4.5 5.0  CL 100 98 94*  CO2 25 25 27   GLUCOSE 127* 91 142*  BUN 14 17 21*  CREATININE 0.87 0.98 1.08  CALCIUM  8.5* 9.0 9.4   No results for input(s): LABPT, INR in the last 72 hours. No results for input(s): LABURIN in the last 72 hours. Results for orders placed or performed during the hospital encounter of 06/28/24  MRSA Next Gen by PCR, Nasal     Status: Abnormal   Collection Time: 06/28/24 10:30 PM   Specimen: Nasal Mucosa; Nasal Swab  Result Value Ref Range Status   MRSA by PCR Next Gen DETECTED (A) NOT DETECTED Final    Comment: (NOTE) The GeneXpert MRSA Assay (FDA approved for NASAL specimens only), is one component of a comprehensive MRSA colonization surveillance program. It is not intended to diagnose MRSA infection nor to guide or monitor treatment for MRSA infections. Test performance is not FDA approved in patients less than 21 years old. Performed at [facility], [address] [place], [place] [address]   Respiratory (~20 pathogens) panel by PCR     Status: None   Collection Time: 07/01/24  9:18 PM   Specimen: Nasopharyngeal Swab; Respiratory  Result Value Ref Range Status   Adenovirus NOT DETECTED NOT DETECTED Final   Coronavirus 229E NOT DETECTED NOT DETECTED Final    Comment: (NOTE) The Coronavirus on the Respiratory Panel, DOES NOT test for the novel  Coronavirus (2019 nCoV)    Coronavirus HKU1 NOT  DETECTED NOT DETECTED Final   Coronavirus NL63 NOT DETECTED NOT DETECTED Final   Coronavirus OC43 NOT DETECTED NOT DETECTED Final   Metapneumovirus NOT DETECTED NOT DETECTED Final   Rhinovirus / Enterovirus NOT DETECTED NOT DETECTED Final   Influenza A NOT DETECTED NOT DETECTED Final   Influenza B NOT DETECTED NOT DETECTED Final   Parainfluenza Virus 1 NOT DETECTED NOT DETECTED Final   Parainfluenza Virus 2 NOT DETECTED NOT DETECTED Final   Parainfluenza Virus 3 NOT DETECTED NOT DETECTED Final   Parainfluenza Virus 4 NOT DETECTED NOT DETECTED Final   Respiratory Syncytial Virus NOT DETECTED NOT DETECTED Final  Bordetella pertussis NOT DETECTED NOT DETECTED Final   Bordetella Parapertussis NOT DETECTED NOT DETECTED Final   Chlamydophila pneumoniae NOT DETECTED NOT DETECTED Final   Mycoplasma pneumoniae NOT DETECTED NOT DETECTED Final    Comment: Performed at [facility], [address] [place], [place] [address]  Resp panel by RT-PCR (RSV, Flu A&B, Covid) Anterior Nasal Swab     Status: None   Collection Time: 07/01/24  9:18 PM   Specimen: Anterior Nasal Swab  Result Value Ref Range Status   SARS Coronavirus 2 by RT PCR NEGATIVE NEGATIVE Final    Comment: (NOTE) SARS-CoV-2 target nucleic acids are NOT DETECTED.  The SARS-CoV-2 RNA is generally detectable in upper respiratory specimens during the acute phase of infection. The lowest concentration of SARS-CoV-2 viral copies this assay can detect is 138 copies/mL. A negative result does not preclude SARS-Cov-2 infection and should not be used as the sole basis for treatment or other patient management decisions. A negative result may occur with  improper specimen collection/handling, submission of specimen other than nasopharyngeal swab, presence of viral mutation(s) within the areas targeted by this assay, and inadequate number of viral copies(<138 copies/mL). A negative result must be combined with clinical observations,  patient history, and epidemiological information. The expected result is Negative.  Fact Sheet for Patients:  BloggerCourse.com  Fact Sheet for Healthcare Providers:  SeriousBroker.it  This test is no t yet approved or cleared by the [place]  FDA and  has been authorized for detection and/or diagnosis of SARS-CoV-2 by FDA under an Emergency Use Authorization (EUA). This EUA will remain  in effect (meaning this test can be used) for the duration of the COVID-19 declaration under Section 564(b)(1) of the Act, 21 U.S.C.section 360bbb-3(b)(1), unless the authorization is terminated  or revoked sooner.       Influenza A by PCR NEGATIVE NEGATIVE Final   Influenza B by PCR NEGATIVE NEGATIVE Final    Comment: (NOTE) The Xpert Xpress SARS-CoV-2/FLU/RSV plus assay is intended as an aid in the diagnosis of influenza from Nasopharyngeal swab specimens and should not be used as a sole basis for treatment. Nasal washings and aspirates are unacceptable for Xpert Xpress SARS-CoV-2/FLU/RSV testing.  Fact Sheet for Patients: BloggerCourse.com  Fact Sheet for Healthcare Providers: SeriousBroker.it  This test is not yet approved or cleared by the [place]  FDA and has been authorized for detection and/or diagnosis of SARS-CoV-2 by FDA under an Emergency Use Authorization (EUA). This EUA will remain in effect (meaning this test can be used) for the duration of the COVID-19 declaration under Section 564(b)(1) of the Act, 21 U.S.C. section 360bbb-3(b)(1), unless the authorization is terminated or revoked.     Resp Syncytial Virus by PCR NEGATIVE NEGATIVE Final    Comment: (NOTE) Fact Sheet for Patients: BloggerCourse.com  Fact Sheet for Healthcare Providers: SeriousBroker.it  This test is not yet approved or cleared by the [place] FDA and has been authorized for detection and/or diagnosis of SARS-CoV-2 by FDA under an Emergency Use Authorization (EUA). This EUA will remain in effect (meaning this test can be used) for the duration of the COVID-19 declaration under Section 564(b)(1) of the Act, 21 U.S.C. section 360bbb-3(b)(1), unless the authorization is terminated or revoked.  Performed at [facility] [address] [place], [place] [address]     Studies/Results: DG CHEST PORT 1 VIEW Result Date: 07/02/2024 CLINICAL DATA:  Shortness of breath. EXAM: PORTABLE CHEST 1 VIEW COMPARISON:  07/01/2024 FINDINGS: Cardiopericardial  silhouette is at upper limits of normal for size. Interstitial markings are diffusely coarsened with chronic features. Patchy airspace disease at the left base is similar to prior. No acute bony abnormality. Telemetry leads overlie the chest. IMPRESSION: Chronic interstitial coarsening with patchy airspace disease at the left base, similar to prior. Electronically Signed   By: Camellia Candle M.D.   On: 07/02/2024 07:13   DG CHEST PORT 1 VIEW Result Date: 07/01/2024 CLINICAL DATA:  Shortness of breath EXAM: PORTABLE CHEST 1 VIEW COMPARISON:  06/30/2024 FINDINGS: Heart is borderline in size. Diffuse interstitial prominence throughout the lungs, beers progressed since prior study. While a component of this likely reflects chronic lung disease/fibrosis seen on prior CT, cannot exclude superimposed edema or atypical infection. Suspect trace bilateral pleural effusions. No acute bony abnormality. IMPRESSION: Diffuse interstitial thickening, worsening since prior study. At least a component of this reflects chronic lung disease/fibrosis. Cannot exclude superimposed edema or infection. Electronically Signed   By: Franky Crease M.D.   On: 07/01/2024 17:28   DG CHEST PORT 1 VIEW Result Date: 06/30/2024 EXAM: 1 VIEW XRAY OF THE CHEST 06/30/2024 01:23:00 PM COMPARISON: 06/30/2024 CLINICAL  HISTORY: Encounter for chest tube removal [id number]. FINDINGS: LINES, TUBES AND DEVICES: Interval removal of right-sided pleural drainage catheter. LUNGS AND PLEURA: No pneumothorax. Low lung volume. Chronic increased interstitial markings throughout bilateral lungs. Blunting of left costophrenic angle, stable. HEART AND MEDIASTINUM: Unchanged heart size and mediastinal contours. BONES AND SOFT TISSUES: No acute osseous abnormality. IMPRESSION: 1. Interval removal of right-sided pleural drainage catheter. No pneumothorax. Electronically signed by: Andrea Gasman MD 06/30/2024 03:13 PM EDT RP Workstation: [id number]    Assessment: Procedure(s): NEPHRECTOMY, PARTIAL, ROBOT-ASSISTED, 4 Days Post-Op  doing well. Persistent O2 requirement after resolution of pneumothorax.  Hospitalist/pulmonology has been consulted and the patient was given several breathing treatments.  Echocardiogram results pending Standard postop pain.  Ileus has resolved  Plan: Appreciate both hospitalist and pulmonologist looking into the etiology of the patient's persistent O2 requirement. He is desparate to get home.  Will work aggressively to discharge if he meets criteria. Continue routine postop care Regular diet Will remain in house until his O2 requirement is in the mid to 4 L or less.   Morene Salines, MD Urology 07/02/2024, 11:53 AM

## 2024-07-02 NOTE — Evaluation (Signed)
 Occupational Therapy Evaluation Patient Details Name: Matthew [name] MRN: [id number] DOB: 1966/03/15 Today's Date: 07/02/2024   History of Present Illness   58 yo male admitted with renal cyst. 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 Imaging (+) pneumothorax-chest tube placement 9/17. Acute respiratory failure with hypoxia. Hx of CAD, CHF, COVID, anxiety, RCA stent.     Clinical Impressions Pt admitted with a renal cyst and the above. Pt currently with functional limitations due to the deficits listed below (see OT Problem List).  Pt will benefit from acute skilled OT to increase their safety and independence with ADL and functional mobility for ADL to facilitate discharge.       If plan is discharge home, recommend the following:   A little help with walking and/or transfers     Functional Status Assessment   Patient has had a recent decline in their functional status and demonstrates the ability to make significant improvements in function in a reasonable and predictable amount of time.     Equipment Recommendations   None recommended by OT     Recommendations for Other Services         Precautions/Restrictions   Precautions Precaution/Restrictions Comments: monitor O2 Restrictions Weight Bearing Restrictions Per Provider Order: No     Mobility Bed Mobility Overal bed mobility: Needs Assistance Bed Mobility: Supine to Sit     Supine to sit: Supervision, HOB elevated     General bed mobility comments: Supv for lines    Transfers Overall transfer level: Needs assistance Equipment used: None Transfers: Sit to/from Stand Sit to Stand: Supervision           General transfer comment: Supv for safety, lines      Balance Overall balance assessment: No apparent balance deficits (not formally assessed)                                         ADL either performed or assessed with clinical judgement   ADL Overall ADL's : Needs  assistance/impaired Eating/Feeding: Set up;Sitting   Grooming: Standing;Set up   Upper Body Bathing: Set up;Sitting   Lower Body Bathing: Minimal assistance;Sit to/from stand;Cueing for safety;Cueing for sequencing   Upper Body Dressing : Set up;Sitting   Lower Body Dressing: Minimal assistance;Sit to/from stand;Cueing for sequencing;Cueing for safety   Toilet Transfer: Set up   Toileting- Clothing Manipulation and Hygiene: Set up         General ADL Comments: educated on energy conservation with ADL activity.     Vision Baseline Vision/History: 0 No visual deficits              Pertinent Vitals/Pain Pain Assessment Pain Assessment: No/denies pain     Extremity/Trunk Assessment Upper Extremity Assessment Upper Extremity Assessment: Generalized weakness           Communication Communication Communication: No apparent difficulties   Cognition Arousal: Alert Behavior During Therapy: WFL for tasks assessed/performed, Agitated Cognition: No apparent impairments             OT - Cognition Comments: pt is ready to go home                 Following commands: Intact       Cueing  General Comments   Cueing Techniques: Verbal cues              Home Living Family/patient expects to be discharged to:: Private  residence Living Arrangements: Spouse/significant other Available Help at Discharge: Family;Available 24 hours/day Type of Home: House Home Access: Stairs to enter [employer] of Steps: 4 Entrance Stairs-Rails: Right Home Layout: One level               Home Equipment: None          Prior Functioning/Environment Prior Level of Function : Independent/Modified Independent               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    OT Problem List: Decreased strength;Decreased activity tolerance   OT Treatment/Interventions: Self-care/ADL training;Patient/family education      OT Goals(Current goals  can be found in the care plan section)   Acute Rehab OT Goals Patient Stated Goal: home asap OT Goal Formulation: With patient Time For Goal Achievement: 07/09/24 Potential to Achieve Goals: Good   OT Frequency:  Min 2X/week    Co-evaluation              AM-PAC OT 6 Clicks Daily Activity     Outcome Measure Help from another person eating meals?: None Help from another person taking care of personal grooming?: None Help from another person toileting, which includes using toliet, bedpan, or urinal?: A Little Help from another person bathing (including washing, rinsing, drying)?: A Little Help from another person to put on and taking off regular upper body clothing?: None Help from another person to put on and taking off regular lower body clothing?: None 6 Click Score: 22   End of Session Equipment Utilized During Treatment: Oxygen Nurse Communication: Mobility status  Activity Tolerance: Patient tolerated treatment well Patient left: in chair;with call bell/phone within reach;with nursing/sitter in room  OT Visit Diagnosis: Unsteadiness on feet (R26.81)                Time: 8889-8873 OT Time Calculation (min): 16 min Charges:  OT General Charges $OT Visit: 1 Visit OT Evaluation $OT Eval Low Complexity: 1 Low    Desirea Mizrahi, Norvel BIRCH 07/02/2024, 12:09 PM

## 2024-07-02 NOTE — Consult Note (Signed)
 NAME:  Matthew [name], MRN:  [id number], DOB:  [birth year], LOS: 3 ADMISSION DATE:  06/28/2024, CONSULTATION DATE:  07/02/2024 REFERRING MD:  Dr. Sherrill, CHIEF COMPLAINT:  hypoxia   History of Present Illness:  Matthew [name] is a 58 y/o M with a PMH significant for anxiety, CAD, hx of RCA stent placement, GERD, obesity, restrictive lung disease (PFTs 11/2022) who underwent elective robotic assisted partial nephrectomy due to a large right renal cystic mass with post operative course c/b R pneumothorax now resolved. TRH consulting us  for acute hypoxic respiratory failure.  The patient was admitted on 06/28/2024 by urology service for elective robotic assisted partial nephrectomy. Post operative course was c/b R pneumothorax. Chest tube placed and managed by urology. Pneumothorax resolved. The patient started to develop new hypoxia since hospital admission and is now on 15 L O2 by HFNC. He notes that he does not feel any dyspnea but has a productive cough of whitish sputum. He denies having any fevers, chills, or night sweats. He has no chest discomfort. He notes some worsening lower extremity swelling. He is well known to our pulmonary clinic and has seen Dr. Tonna as an OP. He met him when he had COVID in 2022 and was given systemic steroids to treat post COVID organizing pneumonia and possible bland DAH given hx of hemoptysis. He currently only uses Spiriva  2 puffs daily and albuterol  as needed. He is on prednisone  10 mg daily at home.    I reviewed CXR performed today 9/20 independently and it looks like patient has bilateral pulmonary infiltrates and vascular congestion concerning for pulmonary edema. CT chest 04/2024 shows upper lobe emphysematous changes as well as some upper lobe irregular honeycombing and lower lobe bronchiectasis and mosaic attenuation.   Pertinent  Medical History  Post COVID pulmonary fibrosis Emphysema CAD Large R kidney mass s/p nephrectomy Iatrogenic PTX - now  resolved.  Significant Hospital Events: Including procedures, antibiotic start and stop dates in addition to other pertinent events   Admitted 9/17, partial nephrectomy, c/b pneumothorax Worsening hypoxic respiratory failure, now on 15 L O2 by [place] 9/20 -->started diuresis and HCAP coverage  Interim History / Subjective:  Patient notes improvement in dyspnea. Oxygen came down to 10 L.  Objective    Blood pressure 125/79, pulse 92, temperature (!) 97.5 F (36.4 C), temperature source Oral, resp. rate (!) 21, height 5' 11 (1.803 m), weight 109.7 kg, SpO2 90%.        Intake/Output Summary (Last 24 hours) at 07/02/2024 1222 Last data filed at 07/02/2024 0600 Gross per 24 hour  Intake 611.94 ml  Output 1875 ml  Net -1263.06 ml   Filed Weights   06/28/24 1042  Weight: 109.7 kg   Examination: General: well appearing, no acute distress HENT: anicteric sclera, well injected conjunctivae, oral and nasal mucosa normal, JVD 10 cm H2O Lungs: Bilateral inspiratory crackles from mid to lower lung fields as well as scattered pulmonary squeaks Cardiovascular: tachycardic, no r/m/g Abdomen: surgical site appears c/d/I, mildly tender to palpation at surgical incision site Extremities: 1+ pitting edema Neuro: awake, alert, oriented x 3 GU: Deferred  Resolved problem list   Assessment and Plan   #Acute Hypoxic Respiratory Failure: #Post COVID ILD: #Emphysema, COPD grade 1, class B: The patient appears hypervolemic on examination and CXR concerning for developing pulmonary edema as well as a new left lower lobe pulmonary infiltrates. Differential diagnosis includes pulmonary edema and pneumonia. MRSA swab +. Therefore, I would recommend pursuing diuresis and to start  antimicrobial for pneumonia. - Send sputum culture - Linezolid  + Zosyn  started, de-escalate based on sputum culture - Agree with diuresis - I reviewed echocardiogram and it seems LVEF is hyperdynamic, RV normal size and function  as TAPSE is > 2 - Continue LAMA/LABA, can d/c ICS since patient is on systemic steroids - Agree with steroid taper - Monitor electrolytes and keep K > 4 and Mg > 2 - Continue IS and flutter valve - OOB to chair - Early mobility with PT/OT - Wean oxygen as tolerated - If patient is not clinically improving with current plan in next 24 - 48 hours, will consider obtaining CTA chest to look at lung parenchyma in more detail as well as rule out PE in the setting of recent surgery  #Iatrogenic PTX: resolved.  Labs   CBC: Recent Labs  Lab 06/28/24 1521 06/29/24 0309 07/01/24 0925 07/02/24 0422  WBC  --   --  15.3* 11.3*  NEUTROABS  --   --  12.4* 10.1*  HGB 16.8 17.3* 16.6 17.3*  HCT 54.5* 53.7* 52.7* 53.7*  MCV  --   --  94.1 92.6  PLT  --   --  218 238    Basic Metabolic Panel: Recent Labs  Lab 06/29/24 0309 06/30/24 0347 07/01/24 0925 07/02/24 0422  NA 133* 135 134* 134*  K 5.2* 4.5 4.5 5.0  CL 97* 100 98 94*  CO2 24 25 25 27   GLUCOSE 151* 127* 91 142*  BUN 12 14 17  21*  CREATININE 1.13 0.87 0.98 1.08  CALCIUM  8.7* 8.5* 9.0 9.4  MG  --  2.6* 2.5* 2.9*  PHOS  --  2.6 3.2 3.3   GFR: Estimated Creatinine Clearance: 92.8 mL/min (by C-G formula based on SCr of 1.08 mg/dL). Recent Labs  Lab 07/01/24 0925 07/02/24 0422  WBC 15.3* 11.3*    Liver Function Tests: Recent Labs  Lab 07/01/24 0925 07/02/24 0422  AST 30 26  ALT 61* 51*  ALKPHOS 101 111  BILITOT 1.1 1.1  PROT 6.9 7.6  ALBUMIN  3.4* 3.6   No results for input(s): LIPASE, AMYLASE in the last 168 hours. No results for input(s): AMMONIA in the last 168 hours.  ABG    Component Value Date/Time   PHART 7.464 (H) 09/26/2020 2035   PCO2ART 31.7 (L) 09/26/2020 2035   PO2ART 90 09/26/2020 2035   HCO3 22.8 09/26/2020 2035   TCO2 24 09/26/2020 2035   ACIDBASEDEF 0.8 11/17/2019 1618   O2SAT 98.0 09/26/2020 2035     Coagulation Profile: No results for input(s): INR, PROTIME in the last 168  hours.  Cardiac Enzymes: No results for input(s): CKTOTAL, CKMB, CKMBINDEX, TROPONINI in the last 168 hours.  HbA1C: No results found for: HGBA1C  CBG: No results for input(s): GLUCAP in the last 168 hours.  Review of Systems:   Not obtained  Past Medical History:  He,  has a past medical history of Anxiety, Coronary artery disease (per pt last cardiology visit 2011 (found documentation 12-05-2009 w/ dr malva with [facility] in epic)/  currently pt is followed by pcp), GERD (gastroesophageal reflux disease), Hyperlipidemia, Hypertension, Olecranon bursitis of left elbow, Respiratory failure (HCC), S/P right coronary artery (RCA) stent placement (12/28/2008), and Wears dentures.   Surgical History:   Past Surgical History:  Procedure Laterality Date   CARDIAC CATHETERIZATION  12-09-2009  dr annis   abnormal stress test (12-05-2009) widely patent coronaries present w/ evidence of diffuse atherosclerotic disease/  widely patent stented segments in the  proximal and mid segment RCA/  LVEF 65%   CORONARY ANGIOPLASTY WITH STENT PLACEMENT  12-28-2008  dr berry   PCI and stenting of the proxmial and mid RCA using cutting balloon atherectomy (x2 DES, Xience)/  LVEF >60% w/ normal wall motion   ESOPHAGOGASTRODUODENOSCOPY N/A 11/17/2019   Procedure: ESOPHAGOGASTRODUODENOSCOPY (EGD);  Surgeon: Rollin Dover, MD;  Location: [facility];  Service: Gastroenterology;  Laterality: N/A;   KNEE ARTHROSCOPY W/ MENISCECTOMY Right 2002   NEUROPLASTY / TRANSPOSITION ULNAR NERVE AT ELBOW Right 10/2014   dr shari CONNORS BURSECTOMY Left 05/06/2017   Procedure: LEFT ELBOW OLECRANON BURSA EXCISION;  Surgeon: shari Easter, MD;  Location: [facility];  Service: Orthopedics;  Laterality: Left;   ORIF RIGHT ANKLE FX  2006   ROBOTIC ASSITED PARTIAL NEPHRECTOMY Right 06/28/2024   Procedure: NEPHRECTOMY, PARTIAL, ROBOT-ASSISTED;  Surgeon: Alvaro Ricardo KATHEE Mickey., MD;  Location: [facility];   Service: Urology;  Laterality: Right;   TONSILLECTOMY  age 39   TRANSTHORACIC ECHOCARDIOGRAM  03/30/2016   mild concentric LVH, ef 50-55%/  trivial TR     Social History:   reports that he has been smoking cigarettes. He started smoking about 46 years ago. He has a 84.2 pack-year smoking history. He has never used smokeless tobacco. He reports current alcohol use of about 1.0 standard drink of alcohol per week. He reports that he does not use drugs.   Family History:  His family history includes Cancer in his father and mother.   Allergies Allergies  Allergen Reactions   Codeine Anaphylaxis and Nausea And Vomiting   Onion Anaphylaxis and Swelling    thoart swells up   Bee Venom Hives     Home Medications  Prior to Admission medications   Medication Sig Start Date End Date Taking? Authorizing Provider  aspirin  EC 81 MG tablet Take 81 mg by mouth daily.   Yes [provider]  docusate sodium  (COLACE) 100 MG capsule Take 1 capsule (100 mg total) by mouth 2 (two) times daily. 06/28/24  Yes Dancy, Alan, PA-C  loratadine-pseudoephedrine (CLARITIN-D 24-HOUR) 10-240 MG 24 hr tablet Take 1 tablet by mouth daily.   Yes [provider]  predniSONE  (DELTASONE ) 10 MG tablet Take 10 mg by mouth as directed.   Yes [provider]  SPIRIVA  RESPIMAT 2.5 MCG/ACT AERS INHALE 2 PUFFS DAILY 02/17/24  Yes Geronimo Amel, MD  traMADol  (ULTRAM ) 50 MG tablet Take 1-2 tablets (50-100 mg total) by mouth every 6 (six) hours as needed for moderate pain (pain score 4-6) or severe pain (pain score 7-10). 06/28/24  Yes Dancy, Alan, PA-C  vitamin B-12 (CYANOCOBALAMIN) 1000 MCG tablet Take 1,000 mcg by mouth 3 (three) times a week.   Yes [provider]  albuterol  (VENTOLIN  HFA) 108 (90 Base) MCG/ACT inhaler INHALE 2 PUFFS INTO THE LUNGS EVERY SIX HOURS AS NEEDED FOR WHEEZING OR SHORTNESS OF BREATH. 11/15/20   Mikhail, Maryann, DO     Thank you for this interesting consult. I  have spent 45 minutes evaluating patient, reviewing chart, and discussing plan of care with patient, family (wife - yemen), and primary medical team (Dr. Carlye). If you have any questions or concerns please reach out to me via pager ([phone]).  Paula Southerly, MD [facility]

## 2024-07-02 NOTE — Progress Notes (Signed)
 A consult had been placed to the IV Nurse for new access; RN was able to place an iv in the pt's right hand prior to this writer arriving at the room;  pt upset about needing to be stuck again,  saying you got this whole left arm; put another one in in case this one goes bad;  attempted x 1 in the left upper arm with ultrasound; vein appeared good on the screen but could not access the vein; pt became angry, using foul language; no further attempts made. Current site remains in right hand. RN aware.

## 2024-07-02 NOTE — Progress Notes (Signed)
 Pt ambulated in the hallway (766ft) with portable pulse ox. O2 sat ranges 89-94% on HFNC 10 L/M. No complaints of dizziness, shortness of breath, and chest pain.

## 2024-07-02 NOTE — Progress Notes (Signed)
 PROGRESS NOTE    Matthew [name]  FMW:[id number] DOB: [birth year] DOA: 06/28/2024 PCP: Geronimo Amel, MD   Brief Narrative:  Matthew [name] is a 58 y.o. male with past medical history of anxiety, CAD, history of RCA stent placement, GERD, hyperlipidemia, class I obesity, hypertension, left elbow bursitis, emphysema, history of respiratory failure, tobacco abuse who underwent a robotically assisted partial nephrectomy due to large right renal cystic mass who developed hypoxia while in PACU with an x-ray showing a right pneumothorax.  TRH consulted for evaluation of electrolyte abnormalities -hyperkalemia. Now having issues with Respiratory Failure and remains on high O2 so pulmonary was consulted for further assistance.   Assessment and Plan:  Right Renal Mass Status post partial right nephrectomy for a large upper pole cystic mass was done by Dr. Patrcia on 917. Continue postop management per primary team.  His JP was removed by 19 and he had minimal output and creatinine remains the same.   Hyperkalemia: s/p Lokelma  10 g p.o. x 1 dose and continued on IVF. K+ Trend:  Recent Labs  Lab 06/15/24 1451 06/29/24 0309 06/30/24 0347 07/01/24 0925 07/02/24 0422  K 4.2 5.2* 4.5 4.5 5.0  -CTM and Trend and repeat CMP in the AM   Hyponatremia: Mild and Fluctuating. Na+ went from 135 -> 133 -> 135 -> 134 x2. CTM and Trend and repeat CMP in the AM   Acute Respiratory Failure w/ Hypoxia: In the setting of Below however oxygen requirement was quite significant and now weaning.  Received a dose of IV Lasix  60 mg x 1 and influenza A/B, RSV and COVID were negative.  Respiratory virus panel was also negative..  Echo being checked and EF was 60 to 65% with no wall motion abnormalities but the patient did have grade 1 diastolic dysfunction.  SpO2: 90 % O2 Flow Rate (L/min): 4 L/min; pulmonary was consulted for further assistance and they recommended pursuing diuresis and starting antimicrobial  therapy for pneumonia.  They are sending a sputum culture and have initiated the patient on linezolid  and Zosyn .  They are recommending deseeding ICS since patient is on systemic steroids and agree with steroid taper.  Patient was given Solu-Medrol  40 mg today and received a dose of Solu-Medrol  125 mg yesterday.  Wean O2 and will need an Ambulatory Home O2 Screen prior to D/C and repeat CXR in the AM. If not improving will obtain CTA Chest but will need to continue to mobilize and be out of bed with incentive spirometry and flutter valve.   Pneumothorax on the Right:  Resolved. There was a right pneumothorax noted on 9/17 in the PACU and had pigtail chest tube inserted and chest tube was placed to waterseal on 9/18 and resolved and subsequently removed on 919 as it was managed by General Surgery.  Continue to wean supplemental oxygen; Repeat CXR on the afternoon of 9/19 showed Interval removal of right-sided pleural drainage catheter. No pneumothorax. Order Flutter Valve and Incentive Spirometry    Pulmonary emphysema, unspecified emphysema type (HCC) and history of post-COVID ILD: Pulmonary was consulted and has changed to Arformoterol  15 mcg neb twice daily and Revefenacin  175 mcg nebs daily; Patient now also has Albuterol  3 mL IH every 6 as needed for wheezing or shortness of breath.  And add Guaifenesin  1200 mg p.o. twice daily, flutter valve, incentive spirometry. PT/OT to evaluate and Treat.  Repeat chest x-ray this morning showed Chronic interstitial coarsening with patchy airspace disease at the left base, similar to prior.  Leukocytosis: In the setting of above. WBC went from 14.9 -> 15.3 -> 11.3.  Started on HCAP coverage by the pulmonary team is now on p.o. linezolid  and IV Zosyn .  CTM for S/Sx of Infection. Repeat CBC in the AM   Lung nodule: Follow-up with pulmonary medicine as an outpatient and will need Surveillance in the outpatient setting    Tobacco abuse: Tobacco cessation advised.  Nicotine replacement therapy may be ordered as needed.   CAD (coronary artery disease): Resume Aspirin  once cleared urology.   Essential hypertension: Pain control. Antihypertensives as needed. CTM BP per Protocol. Last BP reading was 138/89   Hyperlipidemia: Follow-up with primary care provider. Might benefit from statin therapy given Hx of CAD.  Hypoalbuminemia: Patient's Albumin  Lvl is now 2.9. CTM and Trend and repeat CMP in the AM   Class I Obesity: Complicates overall prognosis and care. Estimated body mass index is 33.73 kg/m as calculated from the following:   Height as of this encounter: 5' 11 (1.803 m).   Weight as of this encounter: 109.7 kg. Weight Loss and Dietary Counseling given   DVT prophylaxis: heparin  injection 5,000 Units Start: 06/29/24 1400 SCDs Start: 06/28/24 1742    Code Status: Full Code Family Communication: No family currently at bedside  Disposition Plan:  Level of care: Med-Surg Status is: Inpatient Remains inpatient appropriate because: There is further clinical improvement in his respiratory status and weaning of oxygen; disposition per primary   Consultants:  Urology (primary) [facility] Pulmonary General Surgery   Procedures:  As delineated as above  Antimicrobials:  Anti-infectives (From admission, onward)    Start     Dose/Rate Route Frequency Ordered Stop   07/01/24 1800  piperacillin -tazobactam (ZOSYN ) IVPB 3.375 g        3.375 g 12.5 mL/hr over 240 Minutes Intravenous Every 8 hours 07/01/24 1610     07/01/24 1700  linezolid  (ZYVOX ) IVPB 600 mg        600 mg 300 mL/hr over 60 Minutes Intravenous Every 12 hours 07/01/24 1610     06/28/24 1002  ceFAZolin  (ANCEF ) IVPB 2g/100 mL premix        2 g 200 mL/hr over 30 Minutes Intravenous 30 min pre-op 06/28/24 1002 06/28/24 1239       Subjective: Seen and examined at bedside and was frustrated and remained on quite a bit of oxygen.  States that his oxygen is fine he feels fine and wants to  go home.  Was on 10 L and was weaned down to 8.  Denies any current pain at this time.  No other concerns or complaints at this time.  Objective: Vitals:   07/02/24 0424 07/02/24 0914 07/02/24 1210 07/02/24 1439  BP: 125/79   138/89  Pulse: 85  92 92  Resp: 18  (!) 21   Temp: (!) 97.5 F (36.4 C)   97.6 F (36.4 C)  TempSrc: Oral   Oral  SpO2: 90% 90% 90% 90%  Weight:      Height:        Intake/Output Summary (Last 24 hours) at 07/02/2024 1716 Last data filed at 07/02/2024 1300 Gross per 24 hour  Intake 851.94 ml  Output 1425 ml  Net -573.06 ml   Filed Weights   06/28/24 1042  Weight: 109.7 kg   Examination: Physical Exam:  Constitutional: WN/WD, obese Caucasian male who appears frustrated and alert agitated Respiratory: Diminished to auscultation bilaterally with some coarse breath sounds and has some slight rhonchi noted but no  appreciable wheezing, rales or crackles., no wheezing, rales, rhonchi or crackles.  Has a normal respiratory effort is not tachypneic but is wearing 2 L supplemental oxygen via nasal cannula high flow nasal cannula Cardiovascular: RRR, no murmurs / rubs / gallops. S1 and S2 auscultated.  Trace edema Abdomen: Soft, a little-tender, distended secondary to body habitus. Bowel sounds positive.  GU: Deferred. Musculoskeletal: No clubbing / cyanosis of digits/nails. Normal strength and muscle tone.  Skin: No rashes, lesions, ulcers on limited skin evaluation. No induration; Warm and dry.  Neurologic: CN 2-12 grossly intact with no focal deficits. Romberg sign and cerebellar reflexes not assessed.  Psychiatric: Normal judgment and insight. Alert and oriented x 3. Normal mood and appropriate affect.   Data Reviewed: I have personally reviewed following labs and imaging studies  CBC: Recent Labs  Lab 06/28/24 1521 06/29/24 0309 07/01/24 0925 07/02/24 0422  WBC  --   --  15.3* 11.3*  NEUTROABS  --   --  12.4* 10.1*  HGB 16.8 17.3* 16.6 17.3*  HCT  54.5* 53.7* 52.7* 53.7*  MCV  --   --  94.1 92.6  PLT  --   --  218 238   Basic Metabolic Panel: Recent Labs  Lab 06/29/24 0309 06/30/24 0347 07/01/24 0925 07/02/24 0422  NA 133* 135 134* 134*  K 5.2* 4.5 4.5 5.0  CL 97* 100 98 94*  CO2 24 25 25 27   GLUCOSE 151* 127* 91 142*  BUN 12 14 17  21*  CREATININE 1.13 0.87 0.98 1.08  CALCIUM  8.7* 8.5* 9.0 9.4  MG  --  2.6* 2.5* 2.9*  PHOS  --  2.6 3.2 3.3   GFR: Estimated Creatinine Clearance: 92.8 mL/min (by C-G formula based on SCr of 1.08 mg/dL). Liver Function Tests: Recent Labs  Lab 07/01/24 0925 07/02/24 0422  AST 30 26  ALT 61* 51*  ALKPHOS 101 111  BILITOT 1.1 1.1  PROT 6.9 7.6  ALBUMIN  3.4* 3.6   No results for input(s): LIPASE, AMYLASE in the last 168 hours. No results for input(s): AMMONIA in the last 168 hours. Coagulation Profile: No results for input(s): INR, PROTIME in the last 168 hours. Cardiac Enzymes: No results for input(s): CKTOTAL, CKMB, CKMBINDEX, TROPONINI in the last 168 hours. BNP (last 3 results) Recent Labs    07/01/24 1600  PROBNP <50.0   HbA1C: No results for input(s): HGBA1C in the last 72 hours. CBG: No results for input(s): GLUCAP in the last 168 hours. Lipid Profile: No results for input(s): CHOL, HDL, LDLCALC, TRIG, CHOLHDL, LDLDIRECT in the last 72 hours. Thyroid Function Tests: No results for input(s): TSH, T4TOTAL, FREET4, T3FREE, THYROIDAB in the last 72 hours. Anemia Panel: No results for input(s): VITAMINB12, FOLATE, FERRITIN, TIBC, IRON , RETICCTPCT in the last 72 hours. Sepsis Labs: No results for input(s): PROCALCITON, LATICACIDVEN in the last 168 hours.  Recent Results (from the past 240 hours)  MRSA Next Gen by PCR, Nasal     Status: Abnormal   Collection Time: 06/28/24 10:30 PM   Specimen: Nasal Mucosa; Nasal Swab  Result Value Ref Range Status   MRSA by PCR Next Gen DETECTED (A) NOT DETECTED Final     Comment: (NOTE) The GeneXpert MRSA Assay (FDA approved for NASAL specimens only), is one component of a comprehensive MRSA colonization surveillance program. It is not intended to diagnose MRSA infection nor to guide or monitor treatment for MRSA infections. Test performance is not FDA approved in patients less than 4 years old. Performed  at [facility], [address], [place], [place] [address]   Respiratory (~20 pathogens) panel by PCR     Status: None   Collection Time: 07/01/24  9:18 PM   Specimen: Nasopharyngeal Swab; Respiratory  Result Value Ref Range Status   Adenovirus NOT DETECTED NOT DETECTED Final   Coronavirus 229E NOT DETECTED NOT DETECTED Final    Comment: (NOTE) The Coronavirus on the Respiratory Panel, DOES NOT test for the novel  Coronavirus (2019 nCoV)    Coronavirus HKU1 NOT DETECTED NOT DETECTED Final   Coronavirus NL63 NOT DETECTED NOT DETECTED Final   Coronavirus OC43 NOT DETECTED NOT DETECTED Final   Metapneumovirus NOT DETECTED NOT DETECTED Final   Rhinovirus / Enterovirus NOT DETECTED NOT DETECTED Final   Influenza A NOT DETECTED NOT DETECTED Final   Influenza B NOT DETECTED NOT DETECTED Final   Parainfluenza Virus 1 NOT DETECTED NOT DETECTED Final   Parainfluenza Virus 2 NOT DETECTED NOT DETECTED Final   Parainfluenza Virus 3 NOT DETECTED NOT DETECTED Final   Parainfluenza Virus 4 NOT DETECTED NOT DETECTED Final   Respiratory Syncytial Virus NOT DETECTED NOT DETECTED Final   Bordetella pertussis NOT DETECTED NOT DETECTED Final   Bordetella Parapertussis NOT DETECTED NOT DETECTED Final   Chlamydophila pneumoniae NOT DETECTED NOT DETECTED Final   Mycoplasma pneumoniae NOT DETECTED NOT DETECTED Final    Comment: Performed at [facility], [address] [place], [place] [address]  Resp panel by RT-PCR (RSV, Flu A&B, Covid) Anterior Nasal Swab     Status: None   Collection Time: 07/01/24  9:18 PM   Specimen: Anterior Nasal Swab   Result Value Ref Range Status   SARS Coronavirus 2 by RT PCR NEGATIVE NEGATIVE Final    Comment: (NOTE) SARS-CoV-2 target nucleic acids are NOT DETECTED.  The SARS-CoV-2 RNA is generally detectable in upper respiratory specimens during the acute phase of infection. The lowest concentration of SARS-CoV-2 viral copies this assay can detect is 138 copies/mL. A negative result does not preclude SARS-Cov-2 infection and should not be used as the sole basis for treatment or other patient management decisions. A negative result may occur with  improper specimen collection/handling, submission of specimen other than nasopharyngeal swab, presence of viral mutation(s) within the areas targeted by this assay, and inadequate number of viral copies(<138 copies/mL). A negative result must be combined with clinical observations, patient history, and epidemiological information. The expected result is Negative.  Fact Sheet for Patients:  BloggerCourse.com  Fact Sheet for Healthcare Providers:  SeriousBroker.it  This test is no t yet approved or cleared by the [place]  FDA and  has been authorized for detection and/or diagnosis of SARS-CoV-2 by FDA under an Emergency Use Authorization (EUA). This EUA will remain  in effect (meaning this test can be used) for the duration of the COVID-19 declaration under Section 564(b)(1) of the Act, 21 U.S.C.section 360bbb-3(b)(1), unless the authorization is terminated  or revoked sooner.       Influenza A by PCR NEGATIVE NEGATIVE Final   Influenza B by PCR NEGATIVE NEGATIVE Final    Comment: (NOTE) The Xpert Xpress SARS-CoV-2/FLU/RSV plus assay is intended as an aid in the diagnosis of influenza from Nasopharyngeal swab specimens and should not be used as a sole basis for treatment. Nasal washings and aspirates are unacceptable for Xpert Xpress SARS-CoV-2/FLU/RSV testing.  Fact Sheet for  Patients: BloggerCourse.com  Fact Sheet for Healthcare Providers: SeriousBroker.it  This test is not yet approved or  cleared by the [place]  FDA and has been authorized for detection and/or diagnosis of SARS-CoV-2 by FDA under an Emergency Use Authorization (EUA). This EUA will remain in effect (meaning this test can be used) for the duration of the COVID-19 declaration under Section 564(b)(1) of the Act, 21 U.S.C. section 360bbb-3(b)(1), unless the authorization is terminated or revoked.     Resp Syncytial Virus by PCR NEGATIVE NEGATIVE Final    Comment: (NOTE) Fact Sheet for Patients: BloggerCourse.com  Fact Sheet for Healthcare Providers: SeriousBroker.it  This test is not yet approved or cleared by the [place]  FDA and has been authorized for detection and/or diagnosis of SARS-CoV-2 by FDA under an Emergency Use Authorization (EUA). This EUA will remain in effect (meaning this test can be used) for the duration of the COVID-19 declaration under Section 564(b)(1) of the Act, 21 U.S.C. section 360bbb-3(b)(1), unless the authorization is terminated or revoked.  Performed at [facility], [address], [place], [place] [address]     Radiology Studies: ECHOCARDIOGRAM COMPLETE Result Date: 07/02/2024    ECHOCARDIOGRAM REPORT   Patient Name:   Quinntin JAYSON Almon Date of Exam: 07/02/2024 Medical Rec #:  [id number]          Height:       71.0 in Accession #:    [id number]         Weight:       241.9 lb Date of Birth:  [birth year]          BSA:          2.286 m Patient Age:    57 years           BP:           125/79 mmHg Patient Gender: M                  HR:           99 bpm. Exam Location:  Inpatient Procedure: 2D Echo (Both Spectral and Color Flow Doppler were utilized during            procedure). Indications:    CHF  History:        Patient has prior  history of Echocardiogram examinations. CAD.  Sonographer:    Norleen Amour Referring Phys: [id number] PAULA SOUTHERLY IMPRESSIONS  1. Left ventricular ejection fraction, by estimation, is 60 to 65%. Left ventricular ejection fraction by 2D MOD biplane is 65.2 %. The left ventricle has normal function. The left ventricle has no regional wall motion abnormalities. Left ventricular diastolic parameters are consistent with Grade I diastolic dysfunction (impaired relaxation).  2. Right ventricular systolic function is normal. The right ventricular size is normal. Tricuspid regurgitation signal is inadequate for assessing PA pressure.  3. The mitral valve is grossly normal. Trivial mitral valve regurgitation.  4. The aortic valve is tricuspid. Aortic valve regurgitation is not visualized.  5. The inferior vena cava is normal in size with greater than 50% respiratory variability, suggesting right atrial pressure of 3 mmHg. Comparison(s): Changes from prior study are noted. 09/28/2020: LVEF 60-65%. FINDINGS  Left Ventricle: Left ventricular ejection fraction, by estimation, is 60 to 65%. Left ventricular ejection fraction by 2D MOD biplane is 65.2 %. The left ventricle has normal function. The left ventricle has no regional wall motion abnormalities. The left ventricular internal cavity size was normal in size. There is no left ventricular hypertrophy. Left ventricular diastolic parameters are consistent with Grade I diastolic dysfunction (impaired relaxation).  Normal left ventricular filling pressure. Right Ventricle: The right ventricular size is normal. No increase in right ventricular wall thickness. Right ventricular systolic function is normal. Tricuspid regurgitation signal is inadequate for assessing PA pressure. Left Atrium: Left atrial size was normal in size. Right Atrium: Right atrial size was normal in size. Pericardium: There is no evidence of pericardial effusion. Mitral Valve: The mitral valve is grossly  normal. Trivial mitral valve regurgitation. Tricuspid Valve: The tricuspid valve is normal in structure. Tricuspid valve regurgitation is not demonstrated. Aortic Valve: The aortic valve is tricuspid. Aortic valve regurgitation is not visualized. Pulmonic Valve: The pulmonic valve was normal in structure. Pulmonic valve regurgitation is not visualized. Aorta: The aortic root and ascending aorta are structurally normal, with no evidence of dilitation. Venous: The inferior vena cava is normal in size with greater than 50% respiratory variability, suggesting right atrial pressure of 3 mmHg. IAS/Shunts: No atrial level shunt detected by color flow Doppler.  LEFT VENTRICLE PLAX 2D                        Biplane EF (MOD) LVIDd:         4.10 cm         LV Biplane EF:   Left LVIDs:         2.70 cm                          ventricular LV PW:         1.10 cm                          ejection LV IVS:        0.90 cm                          fraction by LVOT diam:     2.10 cm                          2D MOD LV SV:         65                               biplane is LV SV Index:   29                               65.2 %. LVOT Area:     3.46 cm                                Diastology                                LV e' medial:    7.18 cm/s LV Volumes (MOD)               LV E/e' medial:  7.9 LV vol d, MOD    95.0 ml       LV e' lateral:   8.70 cm/s A2C:                           LV  E/e' lateral: 6.6 LV vol d, MOD    84.8 ml A4C: LV vol s, MOD    33.8 ml A2C: LV vol s, MOD    29.7 ml A4C: LV SV MOD A2C:   61.2 ml LV SV MOD A4C:   84.8 ml LV SV MOD BP:    59.2 ml RIGHT VENTRICLE             IVC RV Basal diam:  3.90 cm     IVC diam: 1.10 cm RV S prime:     16.30 cm/s TAPSE (M-mode): 2.2 cm LEFT ATRIUM             Index        RIGHT ATRIUM           Index LA diam:        3.70 cm 1.62 cm/m   RA Area:     14.10 cm LA Vol (A2C):   45.6 ml 19.95 ml/m  RA Volume:   29.70 ml  12.99 ml/m LA Vol (A4C):   49.7 ml 21.74 ml/m LA Biplane  Vol: 48.6 ml 21.26 ml/m  AORTIC VALVE             PULMONIC VALVE LVOT Vmax:   116.00 cm/s PV Vmax:       1.03 m/s LVOT Vmean:  73.700 cm/s PV Peak grad:  4.2 mmHg LVOT VTI:    0.189 m  AORTA Ao Root diam: 2.80 cm Ao Asc diam:  2.70 cm MITRAL VALVE MV Area (PHT): 4.01 cm    SHUNTS MV Decel Time: 189 msec    Systemic VTI:  0.19 m MV E velocity: 57.00 cm/s  Systemic Diam: 2.10 cm MV A velocity: 83.10 cm/s MV E/A ratio:  0.69 Vinie Maxcy MD Electronically signed by Vinie Maxcy MD Signature Date/Time: 07/02/2024/1:07:57 PM    Final    DG CHEST PORT 1 VIEW Result Date: 07/02/2024 CLINICAL DATA:  Shortness of breath. EXAM: PORTABLE CHEST 1 VIEW COMPARISON:  07/01/2024 FINDINGS: Cardiopericardial silhouette is at upper limits of normal for size. Interstitial markings are diffusely coarsened with chronic features. Patchy airspace disease at the left base is similar to prior. No acute bony abnormality. Telemetry leads overlie the chest. IMPRESSION: Chronic interstitial coarsening with patchy airspace disease at the left base, similar to prior. Electronically Signed   By: Camellia Candle M.D.   On: 07/02/2024 07:13   DG CHEST PORT 1 VIEW Result Date: 07/01/2024 CLINICAL DATA:  Shortness of breath EXAM: PORTABLE CHEST 1 VIEW COMPARISON:  06/30/2024 FINDINGS: Heart is borderline in size. Diffuse interstitial prominence throughout the lungs, beers progressed since prior study. While a component of this likely reflects chronic lung disease/fibrosis seen on prior CT, cannot exclude superimposed edema or atypical infection. Suspect trace bilateral pleural effusions. No acute bony abnormality. IMPRESSION: Diffuse interstitial thickening, worsening since prior study. At least a component of this reflects chronic lung disease/fibrosis. Cannot exclude superimposed edema or infection. Electronically Signed   By: Franky Crease M.D.   On: 07/01/2024 17:28   Scheduled Meds:  arformoterol   15 mcg Nebulization BID   heparin   injection (subcutaneous)  5,000 Units Subcutaneous Q8H   methylPREDNISolone  (SOLU-MEDROL ) injection  40 mg Intravenous Daily   metoCLOPramide  (REGLAN ) injection  10 mg Intravenous Q8H   mupirocin  ointment  1 Application Nasal BID   revefenacin   175 mcg Nebulization Daily   senna  2 tablet Oral Daily   Continuous Infusions:  linezolid  (ZYVOX ) IV 600 mg (07/02/24 1106)  piperacillin -tazobactam (ZOSYN )  IV 3.375 g (07/02/24 1524)    LOS: 3 days   Alejandro Marker, DO [facility] Available via Epic secure chat 7am-7pm After these hours, please refer to coverage provider listed on [email or web address] 07/02/2024, 5:16 PM

## 2024-07-02 NOTE — Telephone Encounter (Signed)
 Patient needs post hospitalization follow up with APP or Dr. Caren within two weeks.

## 2024-07-03 ENCOUNTER — Inpatient Hospital Stay (HOSPITAL_COMMUNITY)

## 2024-07-03 DIAGNOSIS — Z8616 Personal history of COVID-19: Secondary | ICD-10-CM

## 2024-07-03 DIAGNOSIS — F419 Anxiety disorder, unspecified: Secondary | ICD-10-CM | POA: Diagnosis not present

## 2024-07-03 DIAGNOSIS — R918 Other nonspecific abnormal finding of lung field: Secondary | ICD-10-CM | POA: Diagnosis not present

## 2024-07-03 DIAGNOSIS — J9690 Respiratory failure, unspecified, unspecified whether with hypoxia or hypercapnia: Secondary | ICD-10-CM | POA: Diagnosis not present

## 2024-07-03 DIAGNOSIS — N281 Cyst of kidney, acquired: Secondary | ICD-10-CM | POA: Diagnosis not present

## 2024-07-03 DIAGNOSIS — J9601 Acute respiratory failure with hypoxia: Secondary | ICD-10-CM

## 2024-07-03 DIAGNOSIS — J939 Pneumothorax, unspecified: Secondary | ICD-10-CM | POA: Diagnosis not present

## 2024-07-03 LAB — CBC WITH DIFFERENTIAL/PLATELET
Abs Immature Granulocytes: 0.06 K/uL (ref 0.00–0.07)
Basophils Absolute: 0 K/uL (ref 0.0–0.1)
Basophils Relative: 0 %
Eosinophils Absolute: 0 K/uL (ref 0.0–0.5)
Eosinophils Relative: 0 %
HCT: 50.7 % (ref 39.0–52.0)
Hemoglobin: 16.3 g/dL (ref 13.0–17.0)
Immature Granulocytes: 0 %
Lymphocytes Relative: 9 %
Lymphs Abs: 1.6 K/uL (ref 0.7–4.0)
MCH: 29.3 pg (ref 26.0–34.0)
MCHC: 32.1 g/dL (ref 30.0–36.0)
MCV: 91.2 fL (ref 80.0–100.0)
Monocytes Absolute: 1.3 K/uL — ABNORMAL HIGH (ref 0.1–1.0)
Monocytes Relative: 8 %
Neutro Abs: 13.7 K/uL — ABNORMAL HIGH (ref 1.7–7.7)
Neutrophils Relative %: 83 %
Platelets: 281 K/uL (ref 150–400)
RBC: 5.56 MIL/uL (ref 4.22–5.81)
RDW: 14.2 % (ref 11.5–15.5)
WBC: 16.7 K/uL — ABNORMAL HIGH (ref 4.0–10.5)
nRBC: 0 % (ref 0.0–0.2)

## 2024-07-03 LAB — COMPREHENSIVE METABOLIC PANEL WITH GFR
ALT: 41 U/L (ref 0–44)
AST: 21 U/L (ref 15–41)
Albumin: 3.5 g/dL (ref 3.5–5.0)
Alkaline Phosphatase: 102 U/L (ref 38–126)
Anion gap: 13 (ref 5–15)
BUN: 26 mg/dL — ABNORMAL HIGH (ref 6–20)
CO2: 26 mmol/L (ref 22–32)
Calcium: 9.2 mg/dL (ref 8.9–10.3)
Chloride: 95 mmol/L — ABNORMAL LOW (ref 98–111)
Creatinine, Ser: 1.09 mg/dL (ref 0.61–1.24)
GFR, Estimated: >60 mL/min (ref >60)
Glucose, Bld: 129 mg/dL — ABNORMAL HIGH (ref 70–99)
Potassium: 4.3 mmol/L (ref 3.5–5.1)
Sodium: 134 mmol/L — ABNORMAL LOW (ref 135–145)
Total Bilirubin: 0.6 mg/dL (ref 0.0–1.2)
Total Protein: 7.2 g/dL (ref 6.5–8.1)

## 2024-07-03 LAB — PHOSPHORUS: Phosphorus: 3.8 mg/dL (ref 2.5–4.6)

## 2024-07-03 LAB — MAGNESIUM: Magnesium: 2.8 mg/dL — ABNORMAL HIGH (ref 1.7–2.4)

## 2024-07-03 MED ORDER — FUROSEMIDE 10 MG/ML IJ SOLN
60.0000 mg | Freq: Once | INTRAMUSCULAR | Status: AC
Start: 2024-07-03 — End: 2024-07-03
  Administered 2024-07-03: 60 mg via INTRAVENOUS
  Filled 2024-07-03: qty 6

## 2024-07-03 NOTE — Telephone Encounter (Signed)
 Called PT no answer left VM. 07/03/24

## 2024-07-03 NOTE — Discharge Summary (Signed)
 Physician Discharge Summary  Matthew [name] FMW:[id number] DOB: 01-02-1966 DOA: 06/28/2024  PCP: Geronimo Amel, MD  Admit date: 06/28/2024  Discharge date: 07/03/2024  Admitted From: Home Disposition:  Left AMA  Recommendations for Outpatient Follow-up:  Follow up with PCP in 1-2 weeks Please obtain BMP/CBC in one week Advised to follow-up with pulmonology Dr. Geronimo.  Home Health:None Equipment/Devices:Home oxygen @ 6L/min  Discharge Condition: Fair CODE STATUS:Full code Diet recommendation: Heart Healthy   Brief Summary / Hospital Course: This 58 y.o. 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  Status post chest tube insertion and subsequent removal.  TRH consulted for evaluation of electrolyte abnormalities -hyperkalemia. Now having issues with Respiratory Failure and remains on high O2 so pulmonary was consulted for further assistance.  Patient signed AMA.  Patient is requiring 6 L of supplemental oxygen at this time.  Explained to the patient that we will continue the current treatment as per pulmonology, we will likely discharge him tomorrow if he improves.  Patient does not want to stay is stated he is feeling better and he will follow-up with pulmonologist Dr. Geronimo.  Patient does have oxygen at home.  Explained risk associated with leaving hospital.  Patient is fully aware about the consequences.  Discharge Diagnoses:  Principal Problem:   Renal cyst Active Problems:   Tobacco abuse   CAD (coronary artery disease)   Obesity (BMI 30.0-34.9)   Pulmonary emphysema, unspecified emphysema type (HCC)   Essential hypertension    Hyperlipidemia   Lung nodule   Pneumothorax on right   Hyperkalemia    Discharge Instructions  Discharge Instructions     For home use only DME oxygen   Complete by: As directed    Length of Need: 6 Months   Liters per Minute: 6   Frequency: Continuous (stationary and portable oxygen unit needed)   Oxygen delivery system: Gas      Allergies as of 07/03/2024       Reactions   Codeine Anaphylaxis, Nausea And Vomiting   Onion Anaphylaxis, Swelling   thoart swells up   Bee Venom Hives        Medication List     STOP taking these medications    aspirin  EC 81 MG tablet   cyanocobalamin 1000 MCG tablet Commonly known as: VITAMIN B12       TAKE these medications    albuterol  108 (90 Base) MCG/ACT inhaler Commonly known as: VENTOLIN  HFA INHALE 2 PUFFS INTO THE LUNGS EVERY SIX HOURS AS NEEDED FOR WHEEZING OR SHORTNESS OF BREATH.   docusate sodium  100 MG capsule Commonly known as: COLACE Take 1 capsule (100 mg total) by mouth 2 (two) times daily.   levofloxacin  750 MG tablet Commonly known as: Levaquin  Take 1 tablet (750 mg total) by mouth daily for 4 days.   linezolid  600 MG tablet Commonly known as: ZYVOX  Take 1 tablet (600 mg total) by mouth 2 (two) times daily for 4 days.   loratadine-pseudoephedrine 10-240 MG 24 hr tablet Commonly known as: CLARITIN-D 24-hour Take 1 tablet by mouth daily.   predniSONE  10 MG tablet Commonly known as: DELTASONE  Take 4 tablets (40 mg total) by mouth as directed for 3 days, THEN 3 tablets (  30 mg total) as directed for 3 days, THEN 2 tablets (20 mg total) as directed for 3 days, THEN 1 tablet (10 mg total) as directed. Start taking on: July 02, 2024 What changed: See the new instructions.   Spiriva  Respimat 2.5 MCG/ACT Aers Generic drug: Tiotropium Bromide  Monohydrate INHALE 2 PUFFS DAILY   traMADol  50 MG tablet Commonly known as: Ultram  Take 1-2 tablets (50-100 mg total) by mouth every 6 (six) hours as needed  for moderate pain (pain score 4-6) or severe pain (pain score 7-10).               Durable Medical Equipment  (From admission, onward)           Start     Ordered   07/03/24 0000  For home use only DME oxygen       Question Answer Comment  Length of Need 6 Months   Liters per Minute 6   Frequency Continuous (stationary and portable oxygen unit needed)   Oxygen delivery system Gas      07/03/24 1111   07/02/24 1745  For home use only DME oxygen  Once       Question Answer Comment  Length of Need 6 Months   Mode or (Route) Nasal cannula   Liters per Minute 5   Frequency Continuous (stationary and portable oxygen unit needed)   Oxygen delivery system Gas      07/02/24 1744            Follow-up Information     Alvaro Ricardo KATHEE Mickey., MD Follow up on 07/18/2024.   Specialty: Urology Why: at 11:15 for MD visit and pathology review Contact information: [address] [place] [address] [phone]                Allergies  Allergen Reactions   Codeine Anaphylaxis and Nausea And Vomiting   Onion Anaphylaxis and Swelling    thoart swells up   Bee Venom Hives    Consultations: Pulmonology Urology   Procedures/Studies: DG CHEST PORT 1 VIEW Result Date: 07/03/2024 EXAM: 1 VIEW XRAY OF THE CHEST 07/03/2024 05:13:00 AM COMPARISON: 07/02/2024 CLINICAL HISTORY: SOB (shortness of breath) [id number]. SOB; Per chart - past medical history of anxiety, CAD, history of RCA stent placement, GERD, hyperlipidemia, class I obesity, hypertension, left elbow bursitis, emphysema, history of respiratory failure, tobacco abuse who underwent a robotically assisted partial nephrectomy due to large right renal cystic mass who developed hypoxia while in PACU with an x-ray showing a right pneumothorax. FINDINGS: LUNGS AND PLEURA: Left upper lobe linear scarring. Chronic interstitial coarsening. Persistent patchy left basilar opacities. No pneumothorax. No pleural effusion. HEART  AND MEDIASTINUM: No acute abnormality of the cardiac and mediastinal silhouettes. BONES AND SOFT TISSUES: No acute osseous abnormality. IMPRESSION: 1. Persistent patchy left basilar opacities. 2. Chronic interstitial coarsening. Electronically signed by: Waddell Calk MD 07/03/2024 05:37 AM EDT RP Workstation: [id number]   ECHOCARDIOGRAM COMPLETE Result Date: 07/02/2024    ECHOCARDIOGRAM REPORT   Patient Name:   [place] JAYSON Plake Date of Exam: 07/02/2024 Medical Rec #:  [id number]          Height:       71.0 in Accession #:    [id number]         Weight:       241.9 lb Date of Birth:  Jul 14, 1966          BSA:          2.286 m Patient  Age:    57 years           BP:           125/79 mmHg Patient Gender: M                  HR:           99 bpm. Exam Location:  Inpatient Procedure: 2D Echo (Both Spectral and Color Flow Doppler were utilized during            procedure). Indications:    CHF  History:        Patient has prior history of Echocardiogram examinations. CAD.  Sonographer:    Norleen Amour Referring Phys: [id number] PAULA SOUTHERLY IMPRESSIONS  1. Left ventricular ejection fraction, by estimation, is 60 to 65%. Left ventricular ejection fraction by 2D MOD biplane is 65.2 %. The left ventricle has normal function. The left ventricle has no regional wall motion abnormalities. Left ventricular diastolic parameters are consistent with Grade I diastolic dysfunction (impaired relaxation).  2. Right ventricular systolic function is normal. The right ventricular size is normal. Tricuspid regurgitation signal is inadequate for assessing PA pressure.  3. The mitral valve is grossly normal. Trivial mitral valve regurgitation.  4. The aortic valve is tricuspid. Aortic valve regurgitation is not visualized.  5. The inferior vena cava is normal in size with greater than 50% respiratory variability, suggesting right atrial pressure of 3 mmHg. Comparison(s): Changes from prior study are noted. 09/28/2020: LVEF 60-65%. FINDINGS   Left Ventricle: Left ventricular ejection fraction, by estimation, is 60 to 65%. Left ventricular ejection fraction by 2D MOD biplane is 65.2 %. The left ventricle has normal function. The left ventricle has no regional wall motion abnormalities. The left ventricular internal cavity size was normal in size. There is no left ventricular hypertrophy. Left ventricular diastolic parameters are consistent with Grade I diastolic dysfunction (impaired relaxation). Normal left ventricular filling pressure. Right Ventricle: The right ventricular size is normal. No increase in right ventricular wall thickness. Right ventricular systolic function is normal. Tricuspid regurgitation signal is inadequate for assessing PA pressure. Left Atrium: Left atrial size was normal in size. Right Atrium: Right atrial size was normal in size. Pericardium: There is no evidence of pericardial effusion. Mitral Valve: The mitral valve is grossly normal. Trivial mitral valve regurgitation. Tricuspid Valve: The tricuspid valve is normal in structure. Tricuspid valve regurgitation is not demonstrated. Aortic Valve: The aortic valve is tricuspid. Aortic valve regurgitation is not visualized. Pulmonic Valve: The pulmonic valve was normal in structure. Pulmonic valve regurgitation is not visualized. Aorta: The aortic root and ascending aorta are structurally normal, with no evidence of dilitation. Venous: The inferior vena cava is normal in size with greater than 50% respiratory variability, suggesting right atrial pressure of 3 mmHg. IAS/Shunts: No atrial level shunt detected by color flow Doppler.  LEFT VENTRICLE PLAX 2D                        Biplane EF (MOD) LVIDd:         4.10 cm         LV Biplane EF:   Left LVIDs:         2.70 cm                          ventricular LV PW:         1.10 cm  ejection LV IVS:        0.90 cm                          fraction by LVOT diam:     2.10 cm                          2D MOD LV SV:          65                               biplane is LV SV Index:   29                               65.2 %. LVOT Area:     3.46 cm                                Diastology                                LV e' medial:    7.18 cm/s LV Volumes (MOD)               LV E/e' medial:  7.9 LV vol d, MOD    95.0 ml       LV e' lateral:   8.70 cm/s A2C:                           LV E/e' lateral: 6.6 LV vol d, MOD    84.8 ml A4C: LV vol s, MOD    33.8 ml A2C: LV vol s, MOD    29.7 ml A4C: LV SV MOD A2C:   61.2 ml LV SV MOD A4C:   84.8 ml LV SV MOD BP:    59.2 ml RIGHT VENTRICLE             IVC RV Basal diam:  3.90 cm     IVC diam: 1.10 cm RV S prime:     16.30 cm/s TAPSE (M-mode): 2.2 cm LEFT ATRIUM             Index        RIGHT ATRIUM           Index LA diam:        3.70 cm 1.62 cm/m   RA Area:     14.10 cm LA Vol (A2C):   45.6 ml 19.95 ml/m  RA Volume:   29.70 ml  12.99 ml/m LA Vol (A4C):   49.7 ml 21.74 ml/m LA Biplane Vol: 48.6 ml 21.26 ml/m  AORTIC VALVE             PULMONIC VALVE LVOT Vmax:   116.00 cm/s PV Vmax:       1.03 m/s LVOT Vmean:  73.700 cm/s PV Peak grad:  4.2 mmHg LVOT VTI:    0.189 m  AORTA Ao Root diam: 2.80 cm Ao Asc diam:  2.70 cm MITRAL VALVE MV Area (PHT): 4.01 cm    SHUNTS MV Decel Time: 189 msec    Systemic VTI:  0.19 m MV E velocity: 57.00 cm/s  Systemic Diam: 2.10 cm MV A  velocity: 83.10 cm/s MV E/A ratio:  0.69 Vinie Maxcy MD Electronically signed by Vinie Maxcy MD Signature Date/Time: 07/02/2024/1:07:57 PM    Final    DG CHEST PORT 1 VIEW Result Date: 07/02/2024 CLINICAL DATA:  Shortness of breath. EXAM: PORTABLE CHEST 1 VIEW COMPARISON:  07/01/2024 FINDINGS: Cardiopericardial silhouette is at upper limits of normal for size. Interstitial markings are diffusely coarsened with chronic features. Patchy airspace disease at the left base is similar to prior. No acute bony abnormality. Telemetry leads overlie the chest. IMPRESSION: Chronic interstitial coarsening with patchy airspace  disease at the left base, similar to prior. Electronically Signed   By: Camellia Candle M.D.   On: 07/02/2024 07:13   DG CHEST PORT 1 VIEW Result Date: 07/01/2024 CLINICAL DATA:  Shortness of breath EXAM: PORTABLE CHEST 1 VIEW COMPARISON:  06/30/2024 FINDINGS: Heart is borderline in size. Diffuse interstitial prominence throughout the lungs, beers progressed since prior study. While a component of this likely reflects chronic lung disease/fibrosis seen on prior CT, cannot exclude superimposed edema or atypical infection. Suspect trace bilateral pleural effusions. No acute bony abnormality. IMPRESSION: Diffuse interstitial thickening, worsening since prior study. At least a component of this reflects chronic lung disease/fibrosis. Cannot exclude superimposed edema or infection. Electronically Signed   By: Franky Crease M.D.   On: 07/01/2024 17:28   DG CHEST PORT 1 VIEW Result Date: 06/30/2024 EXAM: 1 VIEW XRAY OF THE CHEST 06/30/2024 01:23:00 PM COMPARISON: 06/30/2024 CLINICAL HISTORY: Encounter for chest tube removal [id number]. FINDINGS: LINES, TUBES AND DEVICES: Interval removal of right-sided pleural drainage catheter. LUNGS AND PLEURA: No pneumothorax. Low lung volume. Chronic increased interstitial markings throughout bilateral lungs. Blunting of left costophrenic angle, stable. HEART AND MEDIASTINUM: Unchanged heart size and mediastinal contours. BONES AND SOFT TISSUES: No acute osseous abnormality. IMPRESSION: 1. Interval removal of right-sided pleural drainage catheter. No pneumothorax. Electronically signed by: Andrea Gasman MD 06/30/2024 03:13 PM EDT RP Workstation: [id number]   DG CHEST PORT 1 VIEW Result Date: 06/30/2024 EXAM: 1 VIEW XRAY OF THE CHEST 06/30/2024 04:55:00 AM COMPARISON: 06/29/2024 CLINICAL HISTORY: Pneumothorax, right pneumothorax FINDINGS: LINES, TUBES AND DEVICES: Stable right basilar pigtail chest tube. LUNGS AND PLEURA: No pneumothorax. Low lung volumes persist. Diffuse  interstitial opacities, slightly progressed from prior. HEART AND MEDIASTINUM: Unchanged heart size and mediastinal contours. BONES AND SOFT TISSUES: No acute osseous abnormality. IMPRESSION: 1. Stable right basilar pigtail chest tube. No pneumothorax. 2. Low lung volumes and diffuse interstitial opacities, slightly progressed from prior. 3. Possible small bilateral pleural effusions. Electronically signed by: Andrea Gasman MD 06/30/2024 01:06 PM EDT RP Workstation: [id number]   DG Chest Port 1 View Result Date: 06/29/2024 CLINICAL DATA:  Pneumothorax on right. EXAM: PORTABLE CHEST 1 VIEW COMPARISON:  Radiograph yesterday.  Chest CT 04/13/2024 FINDINGS: Right basilar pigtail catheter is coiled at the lung base. No visible pneumothorax. Stable appearance of chronic lung disease. Stable heart size and mediastinal contours. Small amount of subcutaneous emphysema in the right lateral chest wall. IMPRESSION: 1. Right basilar pigtail catheter in place. No visible pneumothorax. 2. Stable chronic lung disease. Electronically Signed   By: Andrea Gasman M.D.   On: 06/29/2024 10:12   X-ray chest PA or AP Result Date: 06/28/2024 CLINICAL DATA:  [id number] Chest tube in place [id number] EXAM: CHEST  1 VIEW COMPARISON:  06/28/2024 3:24 p.m. FINDINGS: Small bore right-sided pigtail thoracostomy tube overlying the right infrahilar region. Low lung volumes with interval re-expansion of the right lung and resolution of the right pneumothorax. Underlying chronic  lung disease changes. Patchy airspace opacities in the right lung base. Mild cardiomegaly. Tortuous aorta with aortic atherosclerosis. No acute fracture or destructive lesions. Multilevel thoracic osteophytosis. IMPRESSION: 1. Interval placement of a pigtail thoracostomy tube on the right with resolution of the right-sided pneumothorax. 2. Underlying chronic lung disease is similar with patchy opacities in the right lung base, probably atelectasis. Electronically Signed    By: Rogelia Myers M.D.   On: 06/28/2024 16:35   X-ray chest PA or AP Result Date: 06/28/2024 CLINICAL DATA:  Postoperative complication. Shortness of breath and decreased oxygen saturation. EXAM: CHEST  1 VIEW COMPARISON:  Chest CT 04/13/2024 FINDINGS: Moderate right pneumothorax. There may be leftward mediastinal shift suggesting an element of tension. Subcutaneous emphysema in the right lateral chest wall. Heterogeneous bilateral lung opacities, likely related to underlying chronic lung disease. No significant pleural effusion. There is no left pneumothorax. The heart is normal in size. IMPRESSION: 1. Moderate right pneumothorax with possible leftward mediastinal shift suggesting an element of tension. 2. Heterogeneous bilateral lung opacities, likely related to underlying chronic lung disease. Critical Value/emergent results were called by telephone at the time of interpretation on 06/28/2024 at 3:51 pm to nurse Josette , who verbally acknowledged these results. Referring clinician is aware, the patient was receiving a chest tube when I called. Electronically Signed   By: Andrea Gasman M.D.   On: 06/28/2024 15:53   (Echo, Carotid, EGD, Colonoscopy, ERCP)    Subjective: Patient left AGAINST MEDICAL ADVICE.  Discharge Exam: Vitals:   07/03/24 0459 07/03/24 0953  BP: 115/72   Pulse: 69   Resp:    Temp: 97.7 F (36.5 C)   SpO2: 90% 91%   Vitals:   07/02/24 1210 07/02/24 1439 07/03/24 0459 07/03/24 0953  BP:  138/89 115/72   Pulse: 92 92 69   Resp: (!) 21     Temp:  97.6 F (36.4 C) 97.7 F (36.5 C)   TempSrc:  Oral Oral   SpO2: 90% 90% 90% 91%  Weight:      Height:        General: Pt is alert, awake, not in acute distress Cardiovascular: RRR, S1/S2 +, no rubs, no gallops Respiratory: CTA bilaterally, no wheezing, no rhonchi Abdominal: Soft, NT, ND, bowel sounds + Extremities: no edema, no cyanosis    The results of significant diagnostics from this hospitalization  (including imaging, microbiology, ancillary and laboratory) are listed below for reference.     Microbiology: Recent Results (from the past 240 hours)  MRSA Next Gen by PCR, Nasal     Status: Abnormal   Collection Time: 06/28/24 10:30 PM   Specimen: Nasal Mucosa; Nasal Swab  Result Value Ref Range Status   MRSA by PCR Next Gen DETECTED (A) NOT DETECTED Final    Comment: (NOTE) The GeneXpert MRSA Assay (FDA approved for NASAL specimens only), is one component of a comprehensive MRSA colonization surveillance program. It is not intended to diagnose MRSA infection nor to guide or monitor treatment for MRSA infections. Test performance is not FDA approved in patients less than 9 years old. Performed at [facility], [address] [place], [place] [address]   Respiratory (~20 pathogens) panel by PCR     Status: None   Collection Time: 07/01/24  9:18 PM   Specimen: Nasopharyngeal Swab; Respiratory  Result Value Ref Range Status   Adenovirus NOT DETECTED NOT DETECTED Final   Coronavirus 229E NOT DETECTED NOT DETECTED Final    Comment: (NOTE) The  Coronavirus on the Respiratory Panel, DOES NOT test for the novel  Coronavirus (2019 nCoV)    Coronavirus HKU1 NOT DETECTED NOT DETECTED Final   Coronavirus NL63 NOT DETECTED NOT DETECTED Final   Coronavirus OC43 NOT DETECTED NOT DETECTED Final   Metapneumovirus NOT DETECTED NOT DETECTED Final   Rhinovirus / Enterovirus NOT DETECTED NOT DETECTED Final   Influenza A NOT DETECTED NOT DETECTED Final   Influenza B NOT DETECTED NOT DETECTED Final   Parainfluenza Virus 1 NOT DETECTED NOT DETECTED Final   Parainfluenza Virus 2 NOT DETECTED NOT DETECTED Final   Parainfluenza Virus 3 NOT DETECTED NOT DETECTED Final   Parainfluenza Virus 4 NOT DETECTED NOT DETECTED Final   Respiratory Syncytial Virus NOT DETECTED NOT DETECTED Final   Bordetella pertussis NOT DETECTED NOT DETECTED Final   Bordetella Parapertussis NOT DETECTED NOT  DETECTED Final   Chlamydophila pneumoniae NOT DETECTED NOT DETECTED Final   Mycoplasma pneumoniae NOT DETECTED NOT DETECTED Final    Comment: Performed at [facility], [address], [place], [place] [address]  Resp panel by RT-PCR (RSV, Flu A&B, Covid) Anterior Nasal Swab     Status: None   Collection Time: 07/01/24  9:18 PM   Specimen: Anterior Nasal Swab  Result Value Ref Range Status   SARS Coronavirus 2 by RT PCR NEGATIVE NEGATIVE Final    Comment: (NOTE) SARS-CoV-2 target nucleic acids are NOT DETECTED.  The SARS-CoV-2 RNA is generally detectable in upper respiratory specimens during the acute phase of infection. The lowest concentration of SARS-CoV-2 viral copies this assay can detect is 138 copies/mL. A negative result does not preclude SARS-Cov-2 infection and should not be used as the sole basis for treatment or other patient management decisions. A negative result may occur with  improper specimen collection/handling, submission of specimen other than nasopharyngeal swab, presence of viral mutation(s) within the areas targeted by this assay, and inadequate number of viral copies(<138 copies/mL). A negative result must be combined with clinical observations, patient history, and epidemiological information. The expected result is Negative.  Fact Sheet for Patients:  BloggerCourse.com  Fact Sheet for Healthcare Providers:  SeriousBroker.it  This test is no t yet approved or cleared by the [place]  FDA and  has been authorized for detection and/or diagnosis of SARS-CoV-2 by FDA under an Emergency Use Authorization (EUA). This EUA will remain  in effect (meaning this test can be used) for the duration of the COVID-19 declaration under Section 564(b)(1) of the Act, 21 U.S.C.section 360bbb-3(b)(1), unless the authorization is terminated  or revoked sooner.       Influenza A by PCR NEGATIVE NEGATIVE Final    Influenza B by PCR NEGATIVE NEGATIVE Final    Comment: (NOTE) The Xpert Xpress SARS-CoV-2/FLU/RSV plus assay is intended as an aid in the diagnosis of influenza from Nasopharyngeal swab specimens and should not be used as a sole basis for treatment. Nasal washings and aspirates are unacceptable for Xpert Xpress SARS-CoV-2/FLU/RSV testing.  Fact Sheet for Patients: BloggerCourse.com  Fact Sheet for Healthcare Providers: SeriousBroker.it  This test is not yet approved or cleared by the [place]  FDA and has been authorized for detection and/or diagnosis of SARS-CoV-2 by FDA under an Emergency Use Authorization (EUA). This EUA will remain in effect (meaning this test can be used) for the duration of the COVID-19 declaration under Section 564(b)(1) of the Act, 21 U.S.C. section 360bbb-3(b)(1), unless the authorization is terminated or revoked.     Resp Syncytial Virus by PCR  NEGATIVE NEGATIVE Final    Comment: (NOTE) Fact Sheet for Patients: BloggerCourse.com  Fact Sheet for Healthcare Providers: SeriousBroker.it  This test is not yet approved or cleared by the [place]  FDA and has been authorized for detection and/or diagnosis of SARS-CoV-2 by FDA under an Emergency Use Authorization (EUA). This EUA will remain in effect (meaning this test can be used) for the duration of the COVID-19 declaration under Section 564(b)(1) of the Act, 21 U.S.C. section 360bbb-3(b)(1), unless the authorization is terminated or revoked.  Performed at [facility], [address], [place], [place] [address]      Labs: BNP (last 3 results) No results for input(s): BNP in the last 8760 hours. Basic Metabolic Panel: Recent Labs  Lab 06/29/24 0309 06/30/24 0347 07/01/24 0925 07/02/24 0422 07/03/24 0339  NA 133* 135 134* 134* 134*  K 5.2* 4.5 4.5 5.0 4.3  CL 97*  100 98 94* 95*  CO2 24 25 25 27 26   GLUCOSE 151* 127* 91 142* 129*  BUN 12 14 17  21* 26*  CREATININE 1.13 0.87 0.98 1.08 1.09  CALCIUM  8.7* 8.5* 9.0 9.4 9.2  MG  --  2.6* 2.5* 2.9* 2.8*  PHOS  --  2.6 3.2 3.3 3.8   Liver Function Tests: Recent Labs  Lab 07/01/24 0925 07/02/24 0422 07/03/24 0339  AST 30 26 21   ALT 61* 51* 41  ALKPHOS 101 111 102  BILITOT 1.1 1.1 0.6  PROT 6.9 7.6 7.2  ALBUMIN  3.4* 3.6 3.5   No results for input(s): LIPASE, AMYLASE in the last 168 hours. No results for input(s): AMMONIA in the last 168 hours. CBC: Recent Labs  Lab 06/28/24 1521 06/29/24 0309 07/01/24 0925 07/02/24 0422 07/03/24 0339  WBC  --   --  15.3* 11.3* 16.7*  NEUTROABS  --   --  12.4* 10.1* 13.7*  HGB 16.8 17.3* 16.6 17.3* 16.3  HCT 54.5* 53.7* 52.7* 53.7* 50.7  MCV  --   --  94.1 92.6 91.2  PLT  --   --  218 238 281   Cardiac Enzymes: No results for input(s): CKTOTAL, CKMB, CKMBINDEX, TROPONINI in the last 168 hours. BNP: Invalid input(s): POCBNP CBG: No results for input(s): GLUCAP in the last 168 hours. D-Dimer No results for input(s): DDIMER in the last 72 hours. Hgb A1c No results for input(s): HGBA1C in the last 72 hours. Lipid Profile No results for input(s): CHOL, HDL, LDLCALC, TRIG, CHOLHDL, LDLDIRECT in the last 72 hours. Thyroid function studies No results for input(s): TSH, T4TOTAL, T3FREE, THYROIDAB in the last 72 hours.  Invalid input(s): FREET3 Anemia work up No results for input(s): VITAMINB12, FOLATE, FERRITIN, TIBC, IRON , RETICCTPCT in the last 72 hours. Urinalysis No results found for: COLORURINE, APPEARANCEUR, LABSPEC, PHURINE, GLUCOSEU, HGBUR, BILIRUBINUR, KETONESUR, PROTEINUR, UROBILINOGEN, NITRITE, LEUKOCYTESUR Sepsis Labs Recent Labs  Lab 07/01/24 0925 07/02/24 0422 07/03/24 0339  WBC 15.3* 11.3* 16.7*   Microbiology Recent Results (from the past 240 hours)   MRSA Next Gen by PCR, Nasal     Status: Abnormal   Collection Time: 06/28/24 10:30 PM   Specimen: Nasal Mucosa; Nasal Swab  Result Value Ref Range Status   MRSA by PCR Next Gen DETECTED (A) NOT DETECTED Final    Comment: (NOTE) The GeneXpert MRSA Assay (FDA approved for NASAL specimens only), is one component of a comprehensive MRSA colonization surveillance program. It is not intended to diagnose MRSA infection nor to guide or monitor treatment for MRSA infections. Test performance is  not FDA approved in patients less than 63 years old. Performed at [facility], [address], [place], [place] [address]   Respiratory (~20 pathogens) panel by PCR     Status: None   Collection Time: 07/01/24  9:18 PM   Specimen: Nasopharyngeal Swab; Respiratory  Result Value Ref Range Status   Adenovirus NOT DETECTED NOT DETECTED Final   Coronavirus 229E NOT DETECTED NOT DETECTED Final    Comment: (NOTE) The Coronavirus on the Respiratory Panel, DOES NOT test for the novel  Coronavirus (2019 nCoV)    Coronavirus HKU1 NOT DETECTED NOT DETECTED Final   Coronavirus NL63 NOT DETECTED NOT DETECTED Final   Coronavirus OC43 NOT DETECTED NOT DETECTED Final   Metapneumovirus NOT DETECTED NOT DETECTED Final   Rhinovirus / Enterovirus NOT DETECTED NOT DETECTED Final   Influenza A NOT DETECTED NOT DETECTED Final   Influenza B NOT DETECTED NOT DETECTED Final   Parainfluenza Virus 1 NOT DETECTED NOT DETECTED Final   Parainfluenza Virus 2 NOT DETECTED NOT DETECTED Final   Parainfluenza Virus 3 NOT DETECTED NOT DETECTED Final   Parainfluenza Virus 4 NOT DETECTED NOT DETECTED Final   Respiratory Syncytial Virus NOT DETECTED NOT DETECTED Final   Bordetella pertussis NOT DETECTED NOT DETECTED Final   Bordetella Parapertussis NOT DETECTED NOT DETECTED Final   Chlamydophila pneumoniae NOT DETECTED NOT DETECTED Final   Mycoplasma pneumoniae NOT DETECTED NOT DETECTED Final    Comment: Performed  at [facility], [address], [place], [place] [address]  Resp panel by RT-PCR (RSV, Flu A&B, Covid) Anterior Nasal Swab     Status: None   Collection Time: 07/01/24  9:18 PM   Specimen: Anterior Nasal Swab  Result Value Ref Range Status   SARS Coronavirus 2 by RT PCR NEGATIVE NEGATIVE Final    Comment: (NOTE) SARS-CoV-2 target nucleic acids are NOT DETECTED.  The SARS-CoV-2 RNA is generally detectable in upper respiratory specimens during the acute phase of infection. The lowest concentration of SARS-CoV-2 viral copies this assay can detect is 138 copies/mL. A negative result does not preclude SARS-Cov-2 infection and should not be used as the sole basis for treatment or other patient management decisions. A negative result may occur with  improper specimen collection/handling, submission of specimen other than nasopharyngeal swab, presence of viral mutation(s) within the areas targeted by this assay, and inadequate number of viral copies(<138 copies/mL). A negative result must be combined with clinical observations, patient history, and epidemiological information. The expected result is Negative.  Fact Sheet for Patients:  BloggerCourse.com  Fact Sheet for Healthcare Providers:  SeriousBroker.it  This test is no t yet approved or cleared by the [place]  FDA and  has been authorized for detection and/or diagnosis of SARS-CoV-2 by FDA under an Emergency Use Authorization (EUA). This EUA will remain  in effect (meaning this test can be used) for the duration of the COVID-19 declaration under Section 564(b)(1) of the Act, 21 U.S.C.section 360bbb-3(b)(1), unless the authorization is terminated  or revoked sooner.       Influenza A by PCR NEGATIVE NEGATIVE Final   Influenza B by PCR NEGATIVE NEGATIVE Final    Comment: (NOTE) The Xpert Xpress SARS-CoV-2/FLU/RSV plus assay is intended as an aid in the diagnosis of  influenza from Nasopharyngeal swab specimens and should not be used as a sole basis for treatment. Nasal washings and aspirates are unacceptable for Xpert Xpress SARS-CoV-2/FLU/RSV testing.  Fact Sheet for Patients: BloggerCourse.com  Fact Sheet for  Healthcare Providers: SeriousBroker.it  This test is not yet approved or cleared by the [place]  FDA and has been authorized for detection and/or diagnosis of SARS-CoV-2 by FDA under an Emergency Use Authorization (EUA). This EUA will remain in effect (meaning this test can be used) for the duration of the COVID-19 declaration under Section 564(b)(1) of the Act, 21 U.S.C. section 360bbb-3(b)(1), unless the authorization is terminated or revoked.     Resp Syncytial Virus by PCR NEGATIVE NEGATIVE Final    Comment: (NOTE) Fact Sheet for Patients: BloggerCourse.com  Fact Sheet for Healthcare Providers: SeriousBroker.it  This test is not yet approved or cleared by the [place]  FDA and has been authorized for detection and/or diagnosis of SARS-CoV-2 by FDA under an Emergency Use Authorization (EUA). This EUA will remain in effect (meaning this test can be used) for the duration of the COVID-19 declaration under Section 564(b)(1) of the Act, 21 U.S.C. section 360bbb-3(b)(1), unless the authorization is terminated or revoked.  Performed at [facility], [address] [place], [place] [address]      Time coordinating discharge: Left AMA  SIGNED:   Darcel Dawley, MD  [facility] 07/03/2024, 3:16 PM Pager   If 7PM-7AM, please contact night-coverage

## 2024-07-03 NOTE — TOC Transition Note (Signed)
 Transition of Care [facility]) - Discharge Note  Patient Details  Name: Matthew [name] MRN: [id number] Date of Birth: 06-Dec-1965  Transition of Care [facility]) CM/SW Contact:  Duwaine GORMAN Aran, LCSW Phone Number: 07/03/2024, 11:31 AM  Clinical Narrative: Patient will need home oxygen and does not have a DME company preference. CSW made home oxygen referral to Jermaine with [employer]. [employer] to deliver travel tank to patient's room. Patient updated. Care management signing off.  Final next level of care: Home/Self Care Barriers to Discharge: Barriers Resolved  Patient Goals and CMS Choice Patient states their goals for this hospitalization and ongoing recovery are:: Return home CMS Medicare.gov Compare Post Acute Care list provided to:: Patient Choice offered to / list presented to : Patient  Discharge Plan and Services Additional resources added to the After Visit Summary for           DME Arranged: Oxygen DME Agency: [employer] Date DME Agency Contacted: 07/03/24 Time DME Agency Contacted: 1037 Representative spoke with at DME Agency: Jermaine  Social Drivers of Health (SDOH) Interventions SDOH Screenings   Food Insecurity: Patient Declined (06/28/2024)  Housing: Unknown (06/28/2024)  Transportation Needs: Patient Declined (06/28/2024)  Utilities: Patient Declined (06/28/2024)  Depression (PHQ2-9): Low Risk  (03/18/2023)  Tobacco Use: High Risk (06/28/2024)   Readmission Risk Interventions     No data to display

## 2024-07-03 NOTE — Progress Notes (Addendum)
 Physical Therapy Treatment and Discharge Patient Details Name: Matthew [name] MRN: [id number] DOB: May 07, 1966 Today's Date: 07/03/2024    SATURATION QUALIFICATIONS: (This note is used to comply with regulatory documentation for home oxygen)  Patient Saturations on Room Air at Rest = 86%  Patient Saturations on Room Air while Ambulating = n/a  Patient Saturations on 6 Liters of oxygen while Ambulating = 89-90%  (86% on 4L)     History of Present Illness 58 yo male admitted with renal cyst. 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 Imaging (+) pneumothorax-chest tube placement 9/17. Acute respiratory failure with hypoxia. Hx of CAD, CHF, COVID, anxiety, RCA stent.    PT Comments  Pt a bit irritated-anxious to go home. Mobilizing well, requires O2.  90% on 4L end of session. No further acute PT needs. Can mobilize with nursing and/or mobility team as able.    If plan is discharge home, recommend the following:     Can travel by private vehicle        Equipment Recommendations  None recommended by PT    Recommendations for Other Services       Precautions / Restrictions Precautions Precaution/Restrictions Comments: monitor O2 Restrictions Weight Bearing Restrictions Per Provider Order: No     Mobility  Bed Mobility Overal bed mobility: Modified Independent                  Transfers Overall transfer level: Modified independent                      Ambulation/Gait Ambulation/Gait assistance: Modified independent (Device/Increase time) Gait Distance (Feet): 350 Feet Assistive device: None Gait Pattern/deviations: WFL(Within Functional Limits)       General Gait Details: O2 86% 4L amb, 89-90% 6L amb   Stairs             Wheelchair Mobility     Tilt Bed    Modified Rankin (Stroke Patients Only)       Balance Overall balance assessment: No apparent balance deficits (not formally assessed)                                           Communication Communication Communication: No apparent difficulties  Cognition Arousal: Alert Behavior During Therapy: WFL for tasks assessed/performed   PT - Cognitive impairments: No apparent impairments                       PT - Cognition Comments: mildly irritated/anxious-wants to go home Following commands: Intact      Cueing Cueing Techniques: Verbal cues  Exercises      General Comments        Pertinent Vitals/Pain Pain Assessment Pain Assessment: No/denies pain    Home Living                          Prior Function            PT Goals (current goals can now be found in the care plan section) Progress towards PT goals: Goals met/education completed, patient discharged from PT    Frequency    Min 2X/week      PT Plan      Co-evaluation              AM-PAC PT 6 Clicks Mobility   Outcome Measure  Help  needed turning from your back to your side while in a flat bed without using bedrails?: None Help needed moving from lying on your back to sitting on the side of a flat bed without using bedrails?: None Help needed moving to and from a bed to a chair (including a wheelchair)?: None Help needed standing up from a chair using your arms (e.g., wheelchair or bedside chair)?: None Help needed to walk in hospital room?: None Help needed climbing 3-5 steps with a railing? : A Little 6 Click Score: 23    End of Session Equipment Utilized During Treatment: Oxygen Activity Tolerance: Patient tolerated treatment well Patient left: in chair;with call bell/phone within reach   PT Visit Diagnosis: Difficulty in walking, not elsewhere classified (R26.2)     Time: 9073-9054 PT Time Calculation (min) (ACUTE ONLY): 19 min  Charges:    $Gait Training: 8-22 mins PT General Charges $$ ACUTE PT VISIT: 1 Visit                         Dannial SQUIBB, PT Acute Rehabilitation  Office: [phone]

## 2024-07-03 NOTE — Progress Notes (Signed)
 NAME:  Matthew [name], MRN:  [id number], DOB:  12/07/1965, LOS: 4 ADMISSION DATE:  06/28/2024, CONSULTATION DATE:  07/02/2024 REFERRING MD:  Dr. Sherrill, CHIEF COMPLAINT:  hypoxia   History of Present Illness:  Matthew [name] is a 58 y/o M with a PMH significant for anxiety, CAD, hx of RCA stent placement, GERD, obesity, restrictive lung disease (PFTs 11/2022) who underwent elective robotic assisted partial nephrectomy due to a large right renal cystic mass with post operative course c/b R pneumothorax now resolved. TRH consulting us  for acute hypoxic respiratory failure.  The patient was admitted on 06/28/2024 by urology service for elective robotic assisted partial nephrectomy. Post operative course was c/b R pneumothorax. Chest tube placed and managed by urology. Pneumothorax resolved. The patient started to develop new hypoxia since hospital admission and is now on 15 L O2 by HFNC. He notes that he does not feel any dyspnea but has a productive cough of whitish sputum. He denies having any fevers, chills, or night sweats. He has no chest discomfort. He notes some worsening lower extremity swelling. He is well known to our pulmonary clinic and has seen Dr. Tonna as an OP. He met him when he had COVID in 2022 and was given systemic steroids to treat post COVID organizing pneumonia and possible bland DAH given hx of hemoptysis. He currently only uses Spiriva  2 puffs daily and albuterol  as needed. He is on prednisone  10 mg daily at home.    I reviewed CXR performed today 9/20 independently and it looks like patient has bilateral pulmonary infiltrates and vascular congestion concerning for pulmonary edema. CT chest 04/2024 shows upper lobe emphysematous changes as well as some upper lobe irregular honeycombing and lower lobe bronchiectasis and mosaic attenuation.   Pertinent  Medical History  Post COVID pulmonary fibrosis Emphysema CAD Large R kidney mass s/p nephrectomy Iatrogenic PTX - now  resolved.  Significant Hospital Events: Including procedures, antibiotic start and stop dates in addition to other pertinent events   Admitted 9/17, partial nephrectomy, c/b pneumothorax Worsening hypoxic respiratory failure, now on 15 L O2 by  9/20 -->started diuresis and HCAP coverage  Interim History / Subjective:  Patient notes improvement in dyspnea. Oxygen came down to 4 L.  Objective    Blood pressure 115/72, pulse 69, temperature 97.7 F (36.5 C), temperature source Oral, resp. rate (!) 21, height 5' 11 (1.803 m), weight 109.7 kg, SpO2 90%.        Intake/Output Summary (Last 24 hours) at 07/03/2024 0753 Last data filed at 07/03/2024 9359 Gross per 24 hour  Intake 1289.51 ml  Output 400 ml  Net 889.51 ml   Filed Weights   06/28/24 1042  Weight: 109.7 kg   Examination: General: well appearing, no acute distress HENT: ATNC Lungs: NWOB Cardiovascular: Warm Neuro: awake, alert, oriented x 3 Resolved problem list   Assessment and Plan   #Acute Hypoxic Respiratory Failure: #Post COVID ILD: #Emphysema, COPD grade 1, class B: The patient appears hypervolemic on examination and CXR concerning for developing pulmonary edema as well as a new left lower lobe pulmonary infiltrates. Differential diagnosis includes pulmonary edema and pneumonia. MRSA swab +. Therefore, I would recommend pursuing diuresis and to antimicrobial for pneumonia. - Send sputum culture - Linezolid  + Zosyn  started, de-escalate based on sputum culture, plan 7 days antibiotics - Continue diuresis as blood pressure and kidney function allow - Continue LAMA/LABA, can d/c ICS since patient is on systemic steroids - Given improvement, recommend transition to oral steroids  by 9/23, 40 mg for 5 days and 20 mg for 5 days then stop - Monitor electrolytes and keep K > 4 and Mg > 2 - Continue IS and flutter valve - OOB to chair - Early mobility with PT/OT - Wean oxygen as tolerated, goal oxygen saturation  88%, it appears he was 92% on room air prior to surgery per flowsheet data, if can not wean then need to ascertain amount needed at rest and with exertion to keep 88% or greater  Iatrogenic PTX: resolved.  Given significant improvement with the above interventions, PCCM will sign off.  Labs   CBC: Recent Labs  Lab 06/28/24 1521 06/29/24 0309 07/01/24 0925 07/02/24 0422 07/03/24 0339  WBC  --   --  15.3* 11.3* 16.7*  NEUTROABS  --   --  12.4* 10.1* 13.7*  HGB 16.8 17.3* 16.6 17.3* 16.3  HCT 54.5* 53.7* 52.7* 53.7* 50.7  MCV  --   --  94.1 92.6 91.2  PLT  --   --  218 238 281    Basic Metabolic Panel: Recent Labs  Lab 06/29/24 0309 06/30/24 0347 07/01/24 0925 07/02/24 0422 07/03/24 0339  NA 133* 135 134* 134* 134*  K 5.2* 4.5 4.5 5.0 4.3  CL 97* 100 98 94* 95*  CO2 24 25 25 27 26   GLUCOSE 151* 127* 91 142* 129*  BUN 12 14 17  21* 26*  CREATININE 1.13 0.87 0.98 1.08 1.09  CALCIUM  8.7* 8.5* 9.0 9.4 9.2  MG  --  2.6* 2.5* 2.9* 2.8*  PHOS  --  2.6 3.2 3.3 3.8   GFR: Estimated Creatinine Clearance: 92 mL/min (by C-G formula based on SCr of 1.09 mg/dL). Recent Labs  Lab 07/01/24 0925 07/02/24 0422 07/03/24 0339  WBC 15.3* 11.3* 16.7*    Liver Function Tests: Recent Labs  Lab 07/01/24 0925 07/02/24 0422 07/03/24 0339  AST 30 26 21   ALT 61* 51* 41  ALKPHOS 101 111 102  BILITOT 1.1 1.1 0.6  PROT 6.9 7.6 7.2  ALBUMIN  3.4* 3.6 3.5   No results for input(s): LIPASE, AMYLASE in the last 168 hours. No results for input(s): AMMONIA in the last 168 hours.  ABG    Component Value Date/Time   PHART 7.464 (H) 09/26/2020 2035   PCO2ART 31.7 (L) 09/26/2020 2035   PO2ART 90 09/26/2020 2035   HCO3 22.8 09/26/2020 2035   TCO2 24 09/26/2020 2035   ACIDBASEDEF 0.8 11/17/2019 1618   O2SAT 98.0 09/26/2020 2035     Coagulation Profile: No results for input(s): INR, PROTIME in the last 168 hours.  Cardiac Enzymes: No results for input(s): CKTOTAL,  CKMB, CKMBINDEX, TROPONINI in the last 168 hours.  HbA1C: No results found for: HGBA1C  CBG: No results for input(s): GLUCAP in the last 168 hours.  Review of Systems:   Not obtained  Past Medical History:  He,  has a past medical history of Anxiety, Coronary artery disease (per pt last cardiology visit 2011 (found documentation 12-05-2009 w/ dr malva with [facility] in epic)/  currently pt is followed by pcp), GERD (gastroesophageal reflux disease), Hyperlipidemia, Hypertension, Olecranon bursitis of left elbow, Respiratory failure (HCC), S/P right coronary artery (RCA) stent placement (12/28/2008), and Wears dentures.   Surgical History:   Past Surgical History:  Procedure Laterality Date   CARDIAC CATHETERIZATION  12-09-2009  dr annis   abnormal stress test (12-05-2009) widely patent coronaries present w/ evidence of diffuse atherosclerotic disease/  widely patent stented segments in the proximal and mid  segment RCA/  LVEF 65%   CORONARY ANGIOPLASTY WITH STENT PLACEMENT  12-28-2008  dr berry   PCI and stenting of the proxmial and mid RCA using cutting balloon atherectomy (x2 DES, Xience)/  LVEF >60% w/ normal wall motion   ESOPHAGOGASTRODUODENOSCOPY N/A 11/17/2019   Procedure: ESOPHAGOGASTRODUODENOSCOPY (EGD);  Surgeon: Rollin Dover, MD;  Location: [facility];  Service: Gastroenterology;  Laterality: N/A;   KNEE ARTHROSCOPY W/ MENISCECTOMY Right 2002   NEUROPLASTY / TRANSPOSITION ULNAR NERVE AT ELBOW Right 10/2014   dr shari CONNORS BURSECTOMY Left 05/06/2017   Procedure: LEFT ELBOW OLECRANON BURSA EXCISION;  Surgeon: shari Easter, MD;  Location: [facility] [place];  Service: Orthopedics;  Laterality: Left;   ORIF RIGHT ANKLE FX  2006   ROBOTIC ASSITED PARTIAL NEPHRECTOMY Right 06/28/2024   Procedure: NEPHRECTOMY, PARTIAL, ROBOT-ASSISTED;  Surgeon: Alvaro Ricardo KATHEE Mickey., MD;  Location: [facility];  Service: Urology;  Laterality: Right;   TONSILLECTOMY  age 39    TRANSTHORACIC ECHOCARDIOGRAM  03/30/2016   mild concentric LVH, ef 50-55%/  trivial TR     Social History:   reports that he has been smoking cigarettes. He started smoking about 46 years ago. He has a 84.2 pack-year smoking history. He has never used smokeless tobacco. He reports current alcohol use of about 1.0 standard drink of alcohol per week. He reports that he does not use drugs.   Family History:  His family history includes Cancer in his father and mother.   Allergies Allergies  Allergen Reactions   Codeine Anaphylaxis and Nausea And Vomiting   Onion Anaphylaxis and Swelling    thoart swells up   Bee Venom Hives     Home Medications  Prior to Admission medications   Medication Sig Start Date End Date Taking? Authorizing Provider  aspirin  EC 81 MG tablet Take 81 mg by mouth daily.   Yes [provider]  docusate sodium  (COLACE) 100 MG capsule Take 1 capsule (100 mg total) by mouth 2 (two) times daily. 06/28/24  Yes Dancy, Alan, PA-C  loratadine-pseudoephedrine (CLARITIN-D 24-HOUR) 10-240 MG 24 hr tablet Take 1 tablet by mouth daily.   Yes [provider]  predniSONE  (DELTASONE ) 10 MG tablet Take 10 mg by mouth as directed.   Yes [provider]  SPIRIVA  RESPIMAT 2.5 MCG/ACT AERS INHALE 2 PUFFS DAILY 02/17/24  Yes Geronimo Amel, MD  traMADol  (ULTRAM ) 50 MG tablet Take 1-2 tablets (50-100 mg total) by mouth every 6 (six) hours as needed for moderate pain (pain score 4-6) or severe pain (pain score 7-10). 06/28/24  Yes Dancy, Alan, PA-C  vitamin B-12 (CYANOCOBALAMIN) 1000 MCG tablet Take 1,000 mcg by mouth 3 (three) times a week.   Yes [provider]  albuterol  (VENTOLIN  HFA) 108 (90 Base) MCG/ACT inhaler INHALE 2 PUFFS INTO THE LUNGS EVERY SIX HOURS AS NEEDED FOR WHEEZING OR SHORTNESS OF BREATH. 11/15/20   Mikhail, Maryann, DO       Donnice JONELLE Beals, MD  Pulmonary and Critical Care

## 2024-07-03 NOTE — Plan of Care (Signed)
  Problem: Education: Goal: Knowledge of General Education information will improve Description: Including pain rating scale, medication(s)/side effects and non-pharmacologic comfort measures Outcome: Progressing   Problem: Health Behavior/Discharge Planning: Goal: Ability to manage health-related needs will improve Outcome: Progressing   Problem: Clinical Measurements: Goal: Ability to maintain clinical measurements within normal limits will improve Outcome: Progressing Goal: Will remain free from infection Outcome: Progressing Goal: Diagnostic test results will improve Outcome: Progressing Goal: Respiratory complications will improve Outcome: Progressing Goal: Cardiovascular complication will be avoided Outcome: Progressing   Problem: Activity: Goal: Risk for activity intolerance will decrease Outcome: Progressing   Problem: Nutrition: Goal: Adequate nutrition will be maintained Outcome: Progressing   Problem: Coping: Goal: Level of anxiety will decrease Outcome: Progressing   Problem: Elimination: Goal: Will not experience complications related to bowel motility Outcome: Progressing Goal: Will not experience complications related to urinary retention Outcome: Progressing   Problem: Pain Managment: Goal: General experience of comfort will improve and/or be controlled Outcome: Progressing   Problem: Safety: Goal: Ability to remain free from injury will improve Outcome: Progressing   Problem: Skin Integrity: Goal: Risk for impaired skin integrity will decrease Outcome: Progressing   Problem: Education: Goal: Knowledge of the prescribed therapeutic regimen will improve Outcome: Progressing   Problem: Bowel/Gastric: Goal: Gastrointestinal status for postoperative course will improve Outcome: Progressing   Problem: Clinical Measurements: Goal: Postoperative complications will be avoided or minimized Outcome: Progressing   Problem: Respiratory: Goal:  Ability to achieve and maintain a regular respiratory rate will improve Outcome: Progressing   Problem: Skin Integrity: Goal: Demonstration of wound healing without infection will improve Outcome: Progressing   Problem: Urinary Elimination: Goal: Ability to avoid or minimize complications of infection will improve Outcome: Progressing Goal: Ability to achieve and maintain urine output will improve Outcome: Progressing

## 2024-07-03 NOTE — Progress Notes (Signed)
 Pt requested to leave AMA after MD told pt he needed to stay another day. Pt educated on dangers of doing so. MD aware. Pt signed AMA form. IV and telemetry removed. Home O2 delivered and pt took with him.

## 2024-07-03 NOTE — Progress Notes (Signed)
 PROGRESS NOTE    Matthew [name]  FMW:[id number] DOB: 1966-04-04 DOA: 06/28/2024 PCP: Geronimo Amel, MD    Brief Narrative:  This 58 y.o. male with past medical history of anxiety, CAD, history of RCA stent placement, GERD, hyperlipidemia, class I obesity, hypertension, left elbow bursitis, emphysema, history of respiratory failure, tobacco abuse who underwent a robotically assisted partial nephrectomy due to large right renal cystic mass who developed hypoxia while in PACU with an x-ray showing a right pneumothorax. TRH consulted for evaluation of electrolyte abnormalities -hyperkalemia. Now having issues with Respiratory Failure and remains on high O2 so pulmonary was consulted for further assistance.  Patient signed AMA.  Assessment & Plan:   Principal Problem:   Renal cyst Active Problems:   Tobacco abuse   CAD (coronary artery disease)   Obesity (BMI 30.0-34.9)   Pulmonary emphysema, unspecified emphysema type (HCC)   Essential hypertension   Hyperlipidemia   Lung nodule   Pneumothorax on right   Hyperkalemia   Right Renal Mass: Status post partial right nephrectomy for a large upper pole cystic mass was done by Dr. Patrcia on 917.   His JP was removed by 9/19 and he had minimal output and creatinine remains the same.   Hyperkalemia: s/p Lokelma  10 g p.o. x 1 dose and continued on IVF.  Potassium normalized.  Hyponatremia: Replaced, Improved..   Acute Respiratory Failure w/ Hypoxia:  In the setting of pneumothorax however oxygen requirement was quite significant and now weaning.  Received a dose of IV Lasix  60 mg x 1 and influenza A/B, RSV and COVID were negative.  Respiratory virus panel was also negative..  Echo being checked and EF was 60 to 65% with no wall motion abnormalities but the patient did have grade 1 diastolic dysfunction.  SpO2: 90 % O2 Flow Rate (L/min): 4 L/min; pulmonary was consulted for further assistance and they recommended pursuing diuresis and  starting antimicrobial therapy for pneumonia.  They are sending a sputum culture and have initiated the patient on linezolid  and Zosyn .  They are recommending deseeding ICS since patient is on systemic steroids and agree with steroid taper.  Patient was given Solu-Medrol  40 mg  and received a dose of Solu-Medrol  125 mg yesterday.  Wean O2 and will need an Ambulatory Home O2 Screen prior to D/C and repeat CXR in the AM. If not improving will obtain CTA Chest but will need to continue to mobilize and be out of bed with incentive spirometry and flutter valve.   Pneumothorax on the Right:  Resolved.  There was a right pneumothorax noted on 9/17 in the PACU and had pigtail chest tube inserted and chest tube was placed to waterseal on 9/18 and resolved and subsequently removed on 919 as it was managed by General Surgery.  Continue to wean supplemental oxygen; Repeat CXR on the afternoon of 9/19 showed Interval removal of right-sided pleural drainage catheter. No pneumothorax. Order Flutter Valve and Incentive Spirometry .   Pulmonary emphysema, unspecified emphysema type (HCC) and history of post-COVID ILD:  Pulmonary was consulted and has changed to Arformoterol  15 mcg neb twice daily and Revefenacin  175 mcg nebs daily; Patient now also has Albuterol  3 mL IH every 6 as needed for wheezing or shortness of breath.  And add Guaifenesin  1200 mg p.o. twice daily, flutter valve, incentive spirometry. PT/OT to evaluate and Treat.  Repeat chest x-ray this morning showed Chronic interstitial coarsening with patchy airspace disease at the left base, similar to prior.   Leukocytosis:  In the setting of above. WBC went from 14.9 -> 15.3 -> 11.3.  Started on HCAP coverage by the pulmonary team is now on p.o. linezolid  and IV Zosyn .  CTM for S/Sx of Infection. Repeat CBC in the AM   Lung nodule: Follow-up with pulmonary medicine as an outpatient and will need Surveillance in the outpatient setting .   Tobacco abuse:  Tobacco cessation advised. Nicotine replacement therapy may be ordered as needed.   CAD (coronary artery disease): Resume Aspirin  once cleared  by urology.   Essential hypertension: Pain control. Antihypertensives as needed. CTM BP per Protocol. Last BP reading was 138/89   Hyperlipidemia: Follow-up with primary care provider. Might benefit from statin therapy given Hx of CAD.   Class I Obesity: Complicates overall prognosis and care. Estimated body mass index is 33.73 kg/m as calculated from the following:   Height as of this encounter: 5' 11 (1.803 m).   Weight as of this encounter: 109.7 kg. Weight Loss and Dietary Counseling given   DVT prophylaxis: Heparin  sq Code Status: Full code Family Communication: No family at bed side. Disposition Plan:    Inpatient> Severity of illness.   Consultants:  Pulmonology  Procedures: Chest tube insertion and removal.  Antimicrobials: Anti-infectives (From admission, onward)    Start     Dose/Rate Route Frequency Ordered Stop   07/02/24 0000  linezolid  (ZYVOX ) 600 MG tablet        600 mg Oral 2 times daily 07/02/24 1747 07/06/24 2359   07/02/24 0000  levofloxacin  (LEVAQUIN ) 750 MG tablet        750 mg Oral Daily 07/02/24 1747 07/06/24 2359   07/01/24 1800  piperacillin -tazobactam (ZOSYN ) IVPB 3.375 g        3.375 g 12.5 mL/hr over 240 Minutes Intravenous Every 8 hours 07/01/24 1610     07/01/24 1700  linezolid  (ZYVOX ) IVPB 600 mg        600 mg 300 mL/hr over 60 Minutes Intravenous Every 12 hours 07/01/24 1610     06/28/24 1002  ceFAZolin  (ANCEF ) IVPB 2g/100 mL premix        2 g 200 mL/hr over 30 Minutes Intravenous 30 min pre-op 06/28/24 1002 06/28/24 1239      Subjective: Patient was seen and examined at bedside.  Overnight events noted. Patient still remains on 6 L of supplemental oxygen. Patient was explained that he is not medically ready, He is requiring 6L/ M oxygen. Patient signed AMA , states he will follow-up with Dr.  Geronimo.  Objective: Vitals:   07/02/24 1210 07/02/24 1439 07/03/24 0459 07/03/24 0953  BP:  138/89 115/72   Pulse: 92 92 69   Resp: (!) 21     Temp:  97.6 F (36.4 C) 97.7 F (36.5 C)   TempSrc:  Oral Oral   SpO2: 90% 90% 90% 91%  Weight:      Height:        Intake/Output Summary (Last 24 hours) at 07/03/2024 1511 Last data filed at 07/03/2024 0640 Gross per 24 hour  Intake 1049.51 ml  Output 400 ml  Net 649.51 ml   Filed Weights   06/28/24 1042  Weight: 109.7 kg    Examination:  General exam: Appears calm and comfortable, not in any acute distress. Respiratory system: Decreased breath sounds. Respiratory effort normal. RR 15 Cardiovascular system: S1 & S2 heard, RRR. No JVD, murmurs, rubs, gallops or clicks.  Gastrointestinal system: Abdomen is nondistended, soft and nontender. Normal bowel sounds heard. Central nervous  system: Alert and oriented. No focal neurological deficits. Extremities: Symmetric 5 x 5 power. Skin: No rashes, lesions or ulcers Psychiatry: Judgement and insight appear normal. Mood & affect appropriate.   Data Reviewed: I have personally reviewed following labs and imaging studies  CBC: Recent Labs  Lab 06/28/24 1521 06/29/24 0309 07/01/24 0925 07/02/24 0422 07/03/24 0339  WBC  --   --  15.3* 11.3* 16.7*  NEUTROABS  --   --  12.4* 10.1* 13.7*  HGB 16.8 17.3* 16.6 17.3* 16.3  HCT 54.5* 53.7* 52.7* 53.7* 50.7  MCV  --   --  94.1 92.6 91.2  PLT  --   --  218 238 281   Basic Metabolic Panel: Recent Labs  Lab 06/29/24 0309 06/30/24 0347 07/01/24 0925 07/02/24 0422 07/03/24 0339  NA 133* 135 134* 134* 134*  K 5.2* 4.5 4.5 5.0 4.3  CL 97* 100 98 94* 95*  CO2 24 25 25 27 26   GLUCOSE 151* 127* 91 142* 129*  BUN 12 14 17  21* 26*  CREATININE 1.13 0.87 0.98 1.08 1.09  CALCIUM  8.7* 8.5* 9.0 9.4 9.2  MG  --  2.6* 2.5* 2.9* 2.8*  PHOS  --  2.6 3.2 3.3 3.8   GFR: Estimated Creatinine Clearance: 92 mL/min (by C-G formula based on SCr  of 1.09 mg/dL). Liver Function Tests: Recent Labs  Lab 07/01/24 0925 07/02/24 0422 07/03/24 0339  AST 30 26 21   ALT 61* 51* 41  ALKPHOS 101 111 102  BILITOT 1.1 1.1 0.6  PROT 6.9 7.6 7.2  ALBUMIN  3.4* 3.6 3.5   No results for input(s): LIPASE, AMYLASE in the last 168 hours. No results for input(s): AMMONIA in the last 168 hours. Coagulation Profile: No results for input(s): INR, PROTIME in the last 168 hours. Cardiac Enzymes: No results for input(s): CKTOTAL, CKMB, CKMBINDEX, TROPONINI in the last 168 hours. BNP (last 3 results) Recent Labs    07/01/24 1600  PROBNP <50.0   HbA1C: No results for input(s): HGBA1C in the last 72 hours. CBG: No results for input(s): GLUCAP in the last 168 hours. Lipid Profile: No results for input(s): CHOL, HDL, LDLCALC, TRIG, CHOLHDL, LDLDIRECT in the last 72 hours. Thyroid Function Tests: No results for input(s): TSH, T4TOTAL, FREET4, T3FREE, THYROIDAB in the last 72 hours. Anemia Panel: No results for input(s): VITAMINB12, FOLATE, FERRITIN, TIBC, IRON , RETICCTPCT in the last 72 hours. Sepsis Labs: No results for input(s): PROCALCITON, LATICACIDVEN in the last 168 hours.  Recent Results (from the past 240 hours)  MRSA Next Gen by PCR, Nasal     Status: Abnormal   Collection Time: 06/28/24 10:30 PM   Specimen: Nasal Mucosa; Nasal Swab  Result Value Ref Range Status   MRSA by PCR Next Gen DETECTED (A) NOT DETECTED Final    Comment: (NOTE) The GeneXpert MRSA Assay (FDA approved for NASAL specimens only), is one component of a comprehensive MRSA colonization surveillance program. It is not intended to diagnose MRSA infection nor to guide or monitor treatment for MRSA infections. Test performance is not FDA approved in patients less than 73 years old. Performed at [facility], [address] [place], [place] [address]   Respiratory (~20 pathogens) panel by  PCR     Status: None   Collection Time: 07/01/24  9:18 PM   Specimen: Nasopharyngeal Swab; Respiratory  Result Value Ref Range Status   Adenovirus NOT DETECTED NOT DETECTED Final   Coronavirus 229E NOT DETECTED NOT DETECTED Final  Comment: (NOTE) The Coronavirus on the Respiratory Panel, DOES NOT test for the novel  Coronavirus (2019 nCoV)    Coronavirus HKU1 NOT DETECTED NOT DETECTED Final   Coronavirus NL63 NOT DETECTED NOT DETECTED Final   Coronavirus OC43 NOT DETECTED NOT DETECTED Final   Metapneumovirus NOT DETECTED NOT DETECTED Final   Rhinovirus / Enterovirus NOT DETECTED NOT DETECTED Final   Influenza A NOT DETECTED NOT DETECTED Final   Influenza B NOT DETECTED NOT DETECTED Final   Parainfluenza Virus 1 NOT DETECTED NOT DETECTED Final   Parainfluenza Virus 2 NOT DETECTED NOT DETECTED Final   Parainfluenza Virus 3 NOT DETECTED NOT DETECTED Final   Parainfluenza Virus 4 NOT DETECTED NOT DETECTED Final   Respiratory Syncytial Virus NOT DETECTED NOT DETECTED Final   Bordetella pertussis NOT DETECTED NOT DETECTED Final   Bordetella Parapertussis NOT DETECTED NOT DETECTED Final   Chlamydophila pneumoniae NOT DETECTED NOT DETECTED Final   Mycoplasma pneumoniae NOT DETECTED NOT DETECTED Final    Comment: Performed at [facility], [address], [place], [place] [address]  Resp panel by RT-PCR (RSV, Flu A&B, Covid) Anterior Nasal Swab     Status: None   Collection Time: 07/01/24  9:18 PM   Specimen: Anterior Nasal Swab  Result Value Ref Range Status   SARS Coronavirus 2 by RT PCR NEGATIVE NEGATIVE Final    Comment: (NOTE) SARS-CoV-2 target nucleic acids are NOT DETECTED.  The SARS-CoV-2 RNA is generally detectable in upper respiratory specimens during the acute phase of infection. The lowest concentration of SARS-CoV-2 viral copies this assay can detect is 138 copies/mL. A negative result does not preclude SARS-Cov-2 infection and should not be used as the sole basis  for treatment or other patient management decisions. A negative result may occur with  improper specimen collection/handling, submission of specimen other than nasopharyngeal swab, presence of viral mutation(s) within the areas targeted by this assay, and inadequate number of viral copies(<138 copies/mL). A negative result must be combined with clinical observations, patient history, and epidemiological information. The expected result is Negative.  Fact Sheet for Patients:  BloggerCourse.com  Fact Sheet for Healthcare Providers:  SeriousBroker.it  This test is no t yet approved or cleared by the [place]  FDA and  has been authorized for detection and/or diagnosis of SARS-CoV-2 by FDA under an Emergency Use Authorization (EUA). This EUA will remain  in effect (meaning this test can be used) for the duration of the COVID-19 declaration under Section 564(b)(1) of the Act, 21 U.S.C.section 360bbb-3(b)(1), unless the authorization is terminated  or revoked sooner.       Influenza A by PCR NEGATIVE NEGATIVE Final   Influenza B by PCR NEGATIVE NEGATIVE Final    Comment: (NOTE) The Xpert Xpress SARS-CoV-2/FLU/RSV plus assay is intended as an aid in the diagnosis of influenza from Nasopharyngeal swab specimens and should not be used as a sole basis for treatment. Nasal washings and aspirates are unacceptable for Xpert Xpress SARS-CoV-2/FLU/RSV testing.  Fact Sheet for Patients: BloggerCourse.com  Fact Sheet for Healthcare Providers: SeriousBroker.it  This test is not yet approved or cleared by the [place]  FDA and has been authorized for detection and/or diagnosis of SARS-CoV-2 by FDA under an Emergency Use Authorization (EUA). This EUA will remain in effect (meaning this test can be used) for the duration of the COVID-19 declaration under Section 564(b)(1) of the Act, 21  U.S.C. section 360bbb-3(b)(1), unless the authorization is terminated or revoked.     Resp Syncytial  Virus by PCR NEGATIVE NEGATIVE Final    Comment: (NOTE) Fact Sheet for Patients: BloggerCourse.com  Fact Sheet for Healthcare Providers: SeriousBroker.it  This test is not yet approved or cleared by the [place]  FDA and has been authorized for detection and/or diagnosis of SARS-CoV-2 by FDA under an Emergency Use Authorization (EUA). This EUA will remain in effect (meaning this test can be used) for the duration of the COVID-19 declaration under Section 564(b)(1) of the Act, 21 U.S.C. section 360bbb-3(b)(1), unless the authorization is terminated or revoked.  Performed at [facility], [address], [place], [place] [address]     Radiology Studies: DG CHEST PORT 1 VIEW Result Date: 07/03/2024 EXAM: 1 VIEW XRAY OF THE CHEST 07/03/2024 05:13:00 AM COMPARISON: 07/02/2024 CLINICAL HISTORY: SOB (shortness of breath) [id number]. SOB; Per chart - past medical history of anxiety, CAD, history of RCA stent placement, GERD, hyperlipidemia, class I obesity, hypertension, left elbow bursitis, emphysema, history of respiratory failure, tobacco abuse who underwent a robotically assisted partial nephrectomy due to large right renal cystic mass who developed hypoxia while in PACU with an x-ray showing a right pneumothorax. FINDINGS: LUNGS AND PLEURA: Left upper lobe linear scarring. Chronic interstitial coarsening. Persistent patchy left basilar opacities. No pneumothorax. No pleural effusion. HEART AND MEDIASTINUM: No acute abnormality of the cardiac and mediastinal silhouettes. BONES AND SOFT TISSUES: No acute osseous abnormality. IMPRESSION: 1. Persistent patchy left basilar opacities. 2. Chronic interstitial coarsening. Electronically signed by: Waddell Calk MD 07/03/2024 05:37 AM EDT RP Workstation: [id number]   ECHOCARDIOGRAM  COMPLETE Result Date: 07/02/2024    ECHOCARDIOGRAM REPORT   Patient Name:   Matthew [name] Date of Exam: 07/02/2024 Medical Rec #:  [id number]          Height:       71.0 in Accession #:    [id number]         Weight:       241.9 lb Date of Birth:  July 05, 1966          BSA:          2.286 m Patient Age:    57 years           BP:           125/79 mmHg Patient Gender: M                  HR:           99 bpm. Exam Location:  Inpatient Procedure: 2D Echo (Both Spectral and Color Flow Doppler were utilized during            procedure). Indications:    CHF  History:        Patient has prior history of Echocardiogram examinations. CAD.  Sonographer:    Norleen Amour Referring Phys: [id number] PAULA SOUTHERLY IMPRESSIONS  1. Left ventricular ejection fraction, by estimation, is 60 to 65%. Left ventricular ejection fraction by 2D MOD biplane is 65.2 %. The left ventricle has normal function. The left ventricle has no regional wall motion abnormalities. Left ventricular diastolic parameters are consistent with Grade I diastolic dysfunction (impaired relaxation).  2. Right ventricular systolic function is normal. The right ventricular size is normal. Tricuspid regurgitation signal is inadequate for assessing PA pressure.  3. The mitral valve is grossly normal. Trivial mitral valve regurgitation.  4. The aortic valve is tricuspid. Aortic valve regurgitation is not visualized.  5. The inferior vena cava is normal in size with greater than 50% respiratory  variability, suggesting right atrial pressure of 3 mmHg. Comparison(s): Changes from prior study are noted. 09/28/2020: LVEF 60-65%. FINDINGS  Left Ventricle: Left ventricular ejection fraction, by estimation, is 60 to 65%. Left ventricular ejection fraction by 2D MOD biplane is 65.2 %. The left ventricle has normal function. The left ventricle has no regional wall motion abnormalities. The left ventricular internal cavity size was normal in size. There is no left ventricular  hypertrophy. Left ventricular diastolic parameters are consistent with Grade I diastolic dysfunction (impaired relaxation). Normal left ventricular filling pressure. Right Ventricle: The right ventricular size is normal. No increase in right ventricular wall thickness. Right ventricular systolic function is normal. Tricuspid regurgitation signal is inadequate for assessing PA pressure. Left Atrium: Left atrial size was normal in size. Right Atrium: Right atrial size was normal in size. Pericardium: There is no evidence of pericardial effusion. Mitral Valve: The mitral valve is grossly normal. Trivial mitral valve regurgitation. Tricuspid Valve: The tricuspid valve is normal in structure. Tricuspid valve regurgitation is not demonstrated. Aortic Valve: The aortic valve is tricuspid. Aortic valve regurgitation is not visualized. Pulmonic Valve: The pulmonic valve was normal in structure. Pulmonic valve regurgitation is not visualized. Aorta: The aortic root and ascending aorta are structurally normal, with no evidence of dilitation. Venous: The inferior vena cava is normal in size with greater than 50% respiratory variability, suggesting right atrial pressure of 3 mmHg. IAS/Shunts: No atrial level shunt detected by color flow Doppler.  LEFT VENTRICLE PLAX 2D                        Biplane EF (MOD) LVIDd:         4.10 cm         LV Biplane EF:   Left LVIDs:         2.70 cm                          ventricular LV PW:         1.10 cm                          ejection LV IVS:        0.90 cm                          fraction by LVOT diam:     2.10 cm                          2D MOD LV SV:         65                               biplane is LV SV Index:   29                               65.2 %. LVOT Area:     3.46 cm                                Diastology  LV e' medial:    7.18 cm/s LV Volumes (MOD)               LV E/e' medial:  7.9 LV vol d, MOD    95.0 ml       LV e' lateral:   8.70  cm/s A2C:                           LV E/e' lateral: 6.6 LV vol d, MOD    84.8 ml A4C: LV vol s, MOD    33.8 ml A2C: LV vol s, MOD    29.7 ml A4C: LV SV MOD A2C:   61.2 ml LV SV MOD A4C:   84.8 ml LV SV MOD BP:    59.2 ml RIGHT VENTRICLE             IVC RV Basal diam:  3.90 cm     IVC diam: 1.10 cm RV S prime:     16.30 cm/s TAPSE (M-mode): 2.2 cm LEFT ATRIUM             Index        RIGHT ATRIUM           Index LA diam:        3.70 cm 1.62 cm/m   RA Area:     14.10 cm LA Vol (A2C):   45.6 ml 19.95 ml/m  RA Volume:   29.70 ml  12.99 ml/m LA Vol (A4C):   49.7 ml 21.74 ml/m LA Biplane Vol: 48.6 ml 21.26 ml/m  AORTIC VALVE             PULMONIC VALVE LVOT Vmax:   116.00 cm/s PV Vmax:       1.03 m/s LVOT Vmean:  73.700 cm/s PV Peak grad:  4.2 mmHg LVOT VTI:    0.189 m  AORTA Ao Root diam: 2.80 cm Ao Asc diam:  2.70 cm MITRAL VALVE MV Area (PHT): 4.01 cm    SHUNTS MV Decel Time: 189 msec    Systemic VTI:  0.19 m MV E velocity: 57.00 cm/s  Systemic Diam: 2.10 cm MV A velocity: 83.10 cm/s MV E/A ratio:  0.69 Vinie Maxcy MD Electronically signed by Vinie Maxcy MD Signature Date/Time: 07/02/2024/1:07:57 PM    Final    DG CHEST PORT 1 VIEW Result Date: 07/02/2024 CLINICAL DATA:  Shortness of breath. EXAM: PORTABLE CHEST 1 VIEW COMPARISON:  07/01/2024 FINDINGS: Cardiopericardial silhouette is at upper limits of normal for size. Interstitial markings are diffusely coarsened with chronic features. Patchy airspace disease at the left base is similar to prior. No acute bony abnormality. Telemetry leads overlie the chest. IMPRESSION: Chronic interstitial coarsening with patchy airspace disease at the left base, similar to prior. Electronically Signed   By: Camellia Candle M.D.   On: 07/02/2024 07:13   Scheduled Meds:  arformoterol   15 mcg Nebulization BID   heparin  injection (subcutaneous)  5,000 Units Subcutaneous Q8H   methylPREDNISolone  (SOLU-MEDROL ) injection  40 mg Intravenous Daily   metoCLOPramide  (REGLAN )  injection  10 mg Intravenous Q8H   mupirocin  ointment  1 Application Nasal BID   revefenacin   175 mcg Nebulization Daily   senna  2 tablet Oral Daily   Continuous Infusions:  linezolid  (ZYVOX ) IV 600 mg (07/03/24 9062)   piperacillin -tazobactam (ZOSYN )  IV 3.375 g (07/03/24 0343)     LOS: 4 days    Time spent: 50 mins    Traivon Morrical Khatri,  MD [facility]   If 7PM-7AM, please contact night-coverage

## 2024-07-04 ENCOUNTER — Encounter: Payer: Self-pay | Admitting: Internal Medicine

## 2024-07-04 ENCOUNTER — Ambulatory Visit: Admitting: Internal Medicine

## 2024-07-04 VITALS — BP 124/68 | HR 87 | Ht 71.0 in | Wt 242.0 lb

## 2024-07-04 DIAGNOSIS — J95821 Acute postprocedural respiratory failure: Secondary | ICD-10-CM | POA: Diagnosis not present

## 2024-07-04 DIAGNOSIS — N2889 Other specified disorders of kidney and ureter: Secondary | ICD-10-CM | POA: Diagnosis not present

## 2024-07-04 DIAGNOSIS — Z09 Encounter for follow-up examination after completed treatment for conditions other than malignant neoplasm: Secondary | ICD-10-CM | POA: Diagnosis not present
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
## 2024-07-04 NOTE — Patient Instructions (Addendum)
 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

## 2024-07-04 NOTE — Telephone Encounter (Signed)
 PT has APT NFN

## 2024-07-14 ENCOUNTER — Ambulatory Visit (INDEPENDENT_AMBULATORY_CARE_PROVIDER_SITE_OTHER)

## 2024-07-14 VITALS — BP 137/81 | HR 75 | Temp 97.5°F | Ht 71.0 in | Wt 245.9 lb

## 2024-07-14 DIAGNOSIS — F411 Generalized anxiety disorder: Secondary | ICD-10-CM | POA: Diagnosis not present

## 2024-07-14 DIAGNOSIS — Z130 Encounter for screening for diseases of the blood and blood-forming organs and certain disorders involving the immune mechanism: Secondary | ICD-10-CM | POA: Diagnosis not present

## 2024-07-14 DIAGNOSIS — N281 Cyst of kidney, acquired: Secondary | ICD-10-CM | POA: Diagnosis not present

## 2024-07-14 DIAGNOSIS — I2510 Atherosclerotic heart disease of native coronary artery without angina pectoris: Secondary | ICD-10-CM

## 2024-07-14 DIAGNOSIS — Z1329 Encounter for screening for other suspected endocrine disorder: Secondary | ICD-10-CM | POA: Diagnosis not present

## 2024-07-14 DIAGNOSIS — Z13228 Encounter for screening for other metabolic disorders: Secondary | ICD-10-CM | POA: Diagnosis not present

## 2024-07-14 DIAGNOSIS — Z1321 Encounter for screening for nutritional disorder: Secondary | ICD-10-CM | POA: Diagnosis not present

## 2024-07-14 DIAGNOSIS — J439 Emphysema, unspecified: Secondary | ICD-10-CM

## 2024-07-14 DIAGNOSIS — Z720 Tobacco use: Secondary | ICD-10-CM | POA: Diagnosis not present

## 2024-07-14 DIAGNOSIS — Z1211 Encounter for screening for malignant neoplasm of colon: Secondary | ICD-10-CM

## 2024-07-14 DIAGNOSIS — E785 Hyperlipidemia, unspecified: Secondary | ICD-10-CM

## 2024-07-14 DIAGNOSIS — Z23 Encounter for immunization: Secondary | ICD-10-CM | POA: Diagnosis not present

## 2024-07-14 DIAGNOSIS — Z125 Encounter for screening for malignant neoplasm of prostate: Secondary | ICD-10-CM

## 2024-07-14 MED ORDER — CLONAZEPAM 0.5 MG PO TABS: 0.5000 mg | ORAL_TABLET | Freq: Two times a day (BID) | ORAL | 0 refills | Status: DC | PRN

## 2024-07-14 NOTE — Patient Instructions (Signed)
 VISIT SUMMARY: You had a follow-up appointment today after your kidney surgery. We discussed your recovery progress, respiratory health, cardiovascular health, anxiety management, and efforts to quit smoking. Overall, you are recovering well and adhering to the post-surgery guidelines.  YOUR PLAN: -PAPILLARY RENAL CELL CARCINOMA, RIGHT KIDNEY, POST-PARTIAL NEPHRECTOMY: You had a partial nephrectomy to remove a cancerous cyst from your right kidney. The surgery was successful, and no further cancer treatment is needed. Please follow up with your surgeon on Tuesday for a post-operative evaluation and to discuss your return to work.  -CHRONIC RESPIRATORY INSUFFICIENCY DUE TO LEFT LUNG LOSS AND SCARRING POST-COVID PNEUMONIA: You have ongoing respiratory issues due to damage from COVID pneumonia. Your oxygen levels are stable. Continue to follow up with your pulmonologist, Dr. Geronimo, and use your albuterol  and Spiriva  inhalers as needed.  -CORONARY ARTERY DISEASE WITH PRIOR STENT PLACEMENT: You have a history of heart disease and have had stents placed in your arteries. Your heart condition is stable. Please continue your cholesterol management and resume taking 81 mg of aspirin  daily now that your surgery is over. Follow up with your cardiologist as needed.  -HYPERLIPIDEMIA: You have high cholesterol, which is managed with medication. Please resume your cholesterol medication now that your surgery is over and get your cholesterol levels checked with your current blood work.  -GENERALIZED ANXIETY DISORDER, TRIALING CLONAZEPAM : You are transitioning to clonazepam  to manage your anxiety. This medication has a low risk of addiction. Take 0.5 mg of clonazepam  1-2 times daily as needed and monitor how you respond to the medication. We will adjust the dosage if necessary.  -TOBACCO USE, CURRENT: You are currently using tobacco sporadically and are trying to quit. Continue your efforts to stop  smoking.  INSTRUCTIONS: Please follow up with your surgeon on Tuesday for a post-operative evaluation and to discuss your return to work. Continue regular follow-ups with your pulmonologist and cardiologist as needed. Resume your cholesterol medication and 81 mg aspirin  daily. Get your cholesterol levels checked with your current blood work. Monitor your response to clonazepam  and adjust the dosage if necessary.  If you have any problems before your next visit feel free to message me via MyChart (minor issues or questions) or call the office, otherwise you may reach out to schedule an office visit.  Thank you! Saddie Sacks, PA-C

## 2024-07-15 LAB — CBC WITH DIFFERENTIAL/PLATELET
Basophils Absolute: 0.1 x10E3/uL (ref 0.0–0.2)
Basos: 0 %
EOS (ABSOLUTE): 0.1 x10E3/uL (ref 0.0–0.4)
Eos: 0 %
Hematocrit: 52.3 % — ABNORMAL HIGH (ref 37.5–51.0)
Hemoglobin: 16.9 g/dL (ref 13.0–17.7)
Immature Grans (Abs): 0.3 x10E3/uL — ABNORMAL HIGH (ref 0.0–0.1)
Immature Granulocytes: 1 %
Lymphocytes Absolute: 3.9 x10E3/uL — ABNORMAL HIGH (ref 0.7–3.1)
Lymphs: 20 %
MCH: 29.9 pg (ref 26.6–33.0)
MCHC: 32.3 g/dL (ref 31.5–35.7)
MCV: 92 fL (ref 79–97)
Monocytes Absolute: 1.2 x10E3/uL — ABNORMAL HIGH (ref 0.1–0.9)
Monocytes: 6 %
Neutrophils Absolute: 14.3 x10E3/uL — ABNORMAL HIGH (ref 1.4–7.0)
Neutrophils: 73 %
Platelets: 269 x10E3/uL (ref 150–450)
RBC: 5.66 x10E6/uL (ref 4.14–5.80)
RDW: 14 % (ref 11.6–15.4)
WBC: 19.8 x10E3/uL — ABNORMAL HIGH (ref 3.4–10.8)

## 2024-07-15 LAB — LIPID PANEL
Chol/HDL Ratio: 4.3 ratio (ref 0.0–5.0)
Cholesterol, Total: 210 mg/dL — ABNORMAL HIGH (ref 100–199)
HDL: 49 mg/dL (ref >39)
LDL Chol Calc (NIH): 135 mg/dL — ABNORMAL HIGH (ref 0–99)
Triglycerides: 143 mg/dL (ref 0–149)
VLDL Cholesterol Cal: 26 mg/dL (ref 5–40)

## 2024-07-15 LAB — COMPREHENSIVE METABOLIC PANEL WITH GFR
ALT: 37 IU/L (ref 0–44)
AST: 21 IU/L (ref 0–40)
Albumin: 4.2 g/dL (ref 3.8–4.9)
Alkaline Phosphatase: 141 IU/L — ABNORMAL HIGH (ref 47–123)
BUN/Creatinine Ratio: 18 (ref 9–20)
BUN: 19 mg/dL (ref 6–24)
Bilirubin Total: 0.4 mg/dL (ref 0.0–1.2)
CO2: 25 mmol/L (ref 20–29)
Calcium: 9.3 mg/dL (ref 8.7–10.2)
Chloride: 97 mmol/L (ref 96–106)
Creatinine, Ser: 1.05 mg/dL (ref 0.76–1.27)
Globulin, Total: 3 g/dL (ref 1.5–4.5)
Glucose: 62 mg/dL — ABNORMAL LOW (ref 70–99)
Potassium: 4.3 mmol/L (ref 3.5–5.2)
Sodium: 137 mmol/L (ref 134–144)
Total Protein: 7.2 g/dL (ref 6.0–8.5)
eGFR: 82 mL/min/1.73 (ref >59)

## 2024-07-15 LAB — VITAMIN D 25 HYDROXY (VIT D DEFICIENCY, FRACTURES): Vit D, 25-Hydroxy: 15.9 ng/mL — ABNORMAL LOW (ref 30.0–100.0)

## 2024-07-15 LAB — TSH: TSH: 2.68 u[IU]/mL (ref 0.450–4.500)

## 2024-07-15 LAB — HEMOGLOBIN A1C
Est. average glucose Bld gHb Est-mCnc: 151 mg/dL
Hgb A1c MFr Bld: 6.9 % — ABNORMAL HIGH (ref 4.8–5.6)

## 2024-07-15 LAB — PSA: Prostate Specific Ag, Serum: 1.6 ng/mL (ref 0.0–4.0)

## 2024-07-17 DIAGNOSIS — F411 Generalized anxiety disorder: Secondary | ICD-10-CM | POA: Insufficient documentation

## 2024-07-17 NOTE — Assessment & Plan Note (Addendum)
 History of CAD status post IVUS guided RCA PCI and stenting with a 3.5 mm x 18 mm long Xience drug-eluting stent 12/28/2008. Left system was free of significant disease. He is remained completely asymptomatic  Follows with cardiology.  Rechecking lipid panel today with labs.  Pt says he's on cholesterol medication however I cannot find any record of it.  - Resume 81 mg aspirin  daily post-surgery.

## 2024-07-17 NOTE — Progress Notes (Signed)
 New Patient Office Visit  Subjective    Patient ID: Matthew [name], male    DOB: 1966/08/24  Age: 58 y.o. MRN: [id number]  CC:  Chief Complaint  Patient presents with   New Patient (Initial Visit)    Establish Care     Discussed the use of AI scribe software for clinical note transcription with the patient, who gave verbal consent to proceed.  History of Present Illness   Matthew [name] is a 58 year old male who presents for follow-up after kidney surgery.  Postoperative status following renal cyst resection - Underwent right renal cyst resection three weeks ago; cyst was larger than a volleyball, initially thought to be the size of a grapefruit - A quarter of the right kidney was removed during surgery - Currently on antibiotics and tapering prednisone  following surgery - No significant pain; minimal use of pain medication postoperatively - Advised to avoid lifting anything heavier than a glass of water  or plate of food; has adhered to these restrictions - Expects to return to work soon - Cyst was discovered last year on CT scan  Pulmonary sequelae of covid-19 pneumonia - Hospitalized for 52 days in December 2021 with COVID pneumonia - Loss of half of the left lung due to COVID-related damage - Persistent scar tissue in the left lung; monitored twice yearly with Dr. Geronimo - Uses Spiriva  inhaler twice daily and albuterol  inhaler as needed, but has not used either since hospital discharge - Uses Claritin D, which was stopped prior to surgery - No current respiratory symptoms of concern  Psychiatric symptoms and medication use - Previously on Lexapro  40 mg for 2.5 to 3 years, discontinued due to increased irritability - Patient inquiring about alprazolam  which he has taken in the past and has helped him   Cardiovascular disease and hyperlipidemia - History of elevated cholesterol - Two coronary artery stents placed in 2010 - On cholesterol medication, which was  paused for surgery, cannot provide name of the cholesterol medication and there is no record of it  - Desires to reduce intake of certain foods to manage cholesterol - Takes 81 mg aspirin , not yet resumed post-surgery  Tobacco use - History of sporadic smoking - Currently attempting to quit  Functional status and activity tolerance - 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 Geronimo. UTD. Breast Cancer: N/A Diabetes: Checking A1c with labs  HLD: Checking cholesterol panel with labs The 10-year ASCVD risk score (Arnett DK, et al., 2019) is: 15.4%  Outpatient Encounter Medications as of 07/14/2024  Medication Sig   albuterol  (VENTOLIN  HFA) 108 (90 Base) MCG/ACT inhaler INHALE 2 PUFFS INTO THE LUNGS EVERY SIX HOURS AS NEEDED FOR WHEEZING OR SHORTNESS OF BREATH.   clonazePAM  (KLONOPIN ) 0.5 MG tablet Take 1 tablet (0.5 mg total) by mouth 2 (two) times daily as needed for anxiety.   docusate sodium  (COLACE) 100 MG capsule Take 1 capsule (100 mg total) by mouth 2 (two) times daily.   predniSONE  (DELTASONE ) 10 MG tablet Take 4 tablets (40 mg total) by mouth as directed for 3 days, THEN 3 tablets (30 mg total) as directed for 3 days, THEN 2 tablets (20 mg total) as directed for 3 days, THEN 1 tablet (10 mg total)  as directed.   SPIRIVA  RESPIMAT 2.5 MCG/ACT AERS INHALE 2 PUFFS DAILY   traMADol  (ULTRAM ) 50 MG tablet Take 1-2 tablets (50-100 mg total) by mouth every 6 (six) hours as needed for moderate pain (pain score 4-6) or severe pain (pain score 7-10).   [DISCONTINUED] loratadine-pseudoephedrine (CLARITIN-D 24-HOUR) 10-240 MG 24 hr tablet Take 1 tablet by mouth daily.   No facility-administered encounter medications on file as of 07/14/2024.    Past Medical History:   Diagnosis Date   Anxiety    Coronary artery disease per pt last cardiology visit 2011 (found documentation 12-05-2009 w/ dr malva with [facility] in epic)/  currently pt is followed by pcp   12-28-2008  PCI and DES x2 to proximal and mid RCA/ last cardiac cath 12-09-2009  widely patent stents   GERD (gastroesophageal reflux disease)    Hyperlipidemia    Hypertension    Olecranon bursitis of left elbow    Respiratory failure (HCC)    S/P right coronary artery (RCA) stent placement 12/28/2008   PCI and DES x2-- proximal and mid RCA   Wears dentures    UPPER    Past Surgical History:  Procedure Laterality Date   CARDIAC CATHETERIZATION  12-09-2009  dr annis   abnormal stress test (12-05-2009) widely patent coronaries present w/ evidence of diffuse atherosclerotic disease/  widely patent stented segments in the proximal and mid segment RCA/  LVEF 65%   CORONARY ANGIOPLASTY WITH STENT PLACEMENT  12-28-2008  dr berry   PCI and stenting of the proxmial and mid RCA using cutting balloon atherectomy (x2 DES, Xience)/  LVEF >60% w/ normal wall motion   ESOPHAGOGASTRODUODENOSCOPY N/A 11/17/2019   Procedure: ESOPHAGOGASTRODUODENOSCOPY (EGD);  Surgeon: Rollin Dover, MD;  Location: [facility];  Service: Gastroenterology;  Laterality: N/A;   KNEE ARTHROSCOPY W/ MENISCECTOMY Right 2002   NEUROPLASTY / TRANSPOSITION ULNAR NERVE AT ELBOW Right 10/2014   dr shari CONNORS BURSECTOMY Left 05/06/2017   Procedure: LEFT ELBOW OLECRANON BURSA EXCISION;  Surgeon: shari Easter, MD;  Location: [facility];  Service: Orthopedics;  Laterality: Left;   ORIF RIGHT ANKLE FX  2006   ROBOTIC ASSITED PARTIAL NEPHRECTOMY Right 06/28/2024   Procedure: NEPHRECTOMY, PARTIAL, ROBOT-ASSISTED;  Surgeon: Alvaro Ricardo KATHEE Mickey., MD;  Location: [facility];  Service: Urology;  Laterality: Right;   TONSILLECTOMY  age 58   TRANSTHORACIC ECHOCARDIOGRAM  03/30/2016   mild concentric LVH, ef 50-55%/  trivial TR     Family History  Problem Relation Age of Onset   Cancer Mother    Cancer Father     Social History   Socioeconomic History   Marital status: Married    Spouse name: Not on file   Number of children: Not on file   Years of education: Not on file   Highest education level: Not on file  Occupational History   Not on file  Tobacco Use   Smoking status: Some Days    Current packs/day: 0.00    Average packs/day: 2.0 packs/day for 42.1 years (84.2 ttl pk-yrs)    Types: Cigarettes    Start date: 10    Last attempt to quit: 11/17/2019    Years since quitting: 4.6   Smokeless tobacco: Never   Tobacco comments:    since age 52  Vaping Use   Vaping status: Never Used  Substance and Sexual Activity   Alcohol use: Yes    Alcohol/week: 1.0 standard drink of alcohol    Types: 1 Cans  of beer per week    Comment: seldom   Drug use: No   Sexual activity: Yes    Birth control/protection: None  Other Topics Concern   Not on file  Social History Narrative   Not on file   Social Drivers of Health   Financial Resource Strain: Not on file  Food Insecurity: Patient Declined (06/28/2024)   Hunger Vital Sign    Worried About Running Out of Food in the Last Year: Patient declined    Ran Out of Food in the Last Year: Patient declined  Transportation Needs: Patient Declined (06/28/2024)   PRAPARE - Administrator, Civil Service (Medical): Patient declined    Lack of Transportation (Non-Medical): Patient declined  Physical Activity: Not on file  Stress: Not on file  Social Connections: Not on file  Intimate Partner Violence: Patient Declined (06/28/2024)   Humiliation, Afraid, Rape, and Kick questionnaire    Fear of Current or Ex-Partner: Patient declined    Emotionally Abused: Patient declined    Physically Abused: Patient declined    Sexually Abused: Patient declined    ROS  Per HPI      Objective    BP 137/81   Pulse 75   Temp (!) 97.5 F (36.4 C) (Oral)    Ht 5' 11 (1.803 m)   Wt 245 lb 14.4 oz (111.5 kg)   SpO2 96%   BMI 34.30 kg/m   Physical Exam Constitutional:      General: He is not in acute distress.    Appearance: Normal appearance.  HENT:     Right Ear: Tympanic membrane normal.     Left Ear: Tympanic membrane normal.     Mouth/Throat:     Mouth: Mucous membranes are moist.     Pharynx: Oropharynx is clear.  Eyes:     Pupils: Pupils are equal, round, and reactive to light.  Cardiovascular:     Rate and Rhythm: Normal rate and regular rhythm.     Heart sounds: Normal heart sounds. No murmur heard.    No friction rub. No gallop.  Pulmonary:     Effort: Pulmonary effort is normal. No respiratory distress.     Breath sounds: Normal breath sounds.  Abdominal:     General: Abdomen is flat. Bowel sounds are normal.     Palpations: Abdomen is soft.     Comments: Surgical sites healing well and are C/D/I  Musculoskeletal:        General: No swelling.     Cervical back: Neck supple.  Lymphadenopathy:     Cervical: No cervical adenopathy.  Skin:    General: Skin is warm and dry.  Neurological:     General: No focal deficit present.     Mental Status: He is alert.  Psychiatric:        Mood and Affect: Mood normal.        Behavior: Behavior normal.        Thought Content: Thought content normal.         Assessment & Plan:   Screen for colon cancer -     Ambulatory referral to Gastroenterology  Screening for endocrine, nutritional, metabolic and immunity disorder -     VITAMIN D 25 Hydroxy (Vit-D Deficiency, Fractures); Future -     TSH; Future -     Hemoglobin A1c; Future -     Lipid panel; Future -     Comprehensive metabolic panel with GFR; Future -  CBC with Differential/Platelet; Future  Screening for prostate cancer -     PSA; Future  Encounter for vaccination -     Pneumococcal conjugate vaccine 20-valent  Coronary artery disease involving native coronary artery of native heart without angina  pectoris Assessment & Plan: History of CAD status post IVUS guided RCA PCI and stenting with a 3.5 mm x 18 mm long Xience drug-eluting stent 12/28/2008. Left system was free of significant disease. He is remained completely asymptomatic  Follows with cardiology.  Rechecking lipid panel today with labs.  Pt says he's on cholesterol medication however I cannot find any record of it.  - Resume 81 mg aspirin  daily post-surgery.   Pulmonary emphysema (HCC) Assessment & Plan: Chronic respiratory insufficiency due to left lung loss and scarring from COVID pneumonia. Oxygen saturation stable at 96%. - Continue regular follow-up with pulmonologist Dr. Geronimo. - Use albuterol  and Spiriva  inhalers as needed.   Hyperlipidemia, unspecified hyperlipidemia type Assessment & Plan: History of hyperlipidemia on statin therapy with lipid profile performed 03/17/2023 revealing a total cholesterol of 183, LDL of 115 and HDL 30.  LDL goal less than 70. Rechecking lipid and CMP with labs.  Continue follow up with cardiology.     Renal cyst, acquired, right Assessment & Plan: Post-partial nephrectomy confirmed complete resection of papillary renal cell carcinoma, grade 2, limited to the kidney. No further oncological treatment required. - Follow up with surgeon on Tuesday for post-operative evaluation and potential release to work.   Tobacco abuse Assessment & Plan: Ongoing tobacco abuse 1/2 pack/day. Patient not interested in medication-assisted cessation at this time. Currently trying to quit on his own.   Generalized anxiety disorder Assessment & Plan: Previously tried and failed SSRIs with worsening of symptoms on those medications. Discussed addictive risk of benzos such as alprazolam . Discussed that Klonopin  less likely to be habit forming with similar effects as Alprazolam . - Prescribe clonazepam  0.5 mg, to be taken 1-2 times daily as needed. -Discussed potential for sedating side effects. PDMP  reviewed, no aberrancies. - Monitor response to clonazepam  and adjust dosage if necessary.   Other orders -     clonazePAM ; Take 1 tablet (0.5 mg total) by mouth 2 (two) times daily as needed for anxiety.  Dispense: 30 tablet; Refill: 0    Return in about 3 months (around 10/14/2024) for Mood, HTN, HLD.   Saddie JULIANNA Sacks, PA-C

## 2024-07-17 NOTE — Assessment & Plan Note (Signed)
 History of hyperlipidemia on statin therapy with lipid profile performed 03/17/2023 revealing a total cholesterol of 183, LDL of 115 and HDL 30.  LDL goal less than 70. Rechecking lipid and CMP with labs.  Continue follow up with cardiology.

## 2024-07-17 NOTE — Assessment & Plan Note (Signed)
 Post-partial nephrectomy confirmed complete resection of papillary renal cell carcinoma, grade 2, limited to the kidney. No further oncological treatment required. - Follow up with surgeon on Tuesday for post-operative evaluation and potential release to work.

## 2024-07-17 NOTE — Assessment & Plan Note (Signed)
 Chronic respiratory insufficiency due to left lung loss and scarring from COVID pneumonia. Oxygen saturation stable at 96%. - Continue regular follow-up with pulmonologist Dr. Geronimo. - Use albuterol  and Spiriva  inhalers as needed.

## 2024-07-17 NOTE — Assessment & Plan Note (Signed)
 Previously tried and failed SSRIs with worsening of symptoms on those medications. Discussed addictive risk of benzos such as alprazolam . Discussed that Klonopin  less likely to be habit forming with similar effects as Alprazolam . - Prescribe clonazepam  0.5 mg, to be taken 1-2 times daily as needed. -Discussed potential for sedating side effects. PDMP reviewed, no aberrancies. - Monitor response to clonazepam  and adjust dosage if necessary.

## 2024-07-17 NOTE — Assessment & Plan Note (Signed)
 Ongoing tobacco abuse 1/2 pack/day. Patient not interested in medication-assisted cessation at this time. Currently trying to quit on his own.

## 2024-07-18 ENCOUNTER — Ambulatory Visit: Payer: Self-pay

## 2024-07-18 DIAGNOSIS — R399 Unspecified symptoms and signs involving the genitourinary system: Secondary | ICD-10-CM | POA: Diagnosis not present

## 2024-07-19 NOTE — Telephone Encounter (Signed)
 Called and spoke with patient. Related message from provider.

## 2024-08-09 ENCOUNTER — Other Ambulatory Visit: Payer: Self-pay

## 2024-08-09 DIAGNOSIS — F411 Generalized anxiety disorder: Secondary | ICD-10-CM

## 2024-08-09 MED ORDER — CLONAZEPAM 0.5 MG PO TABS: ORAL_TABLET | ORAL | 0 refills | Status: DC

## 2024-08-09 NOTE — Telephone Encounter (Signed)
 Pt called and scheduled an appointment and would like a refill of the clonazepam .  He stated it is working well for him and would like a refill to last until his appt in January.  Also he is going to stop by and bring a medication I believe so maybe you can refill it.  He said that you are not the prescriber.

## 2024-08-09 NOTE — Telephone Encounter (Signed)
 Please call him and get him scheduled for a follow up for the week of 10/15/23 for follow up. He didn't schedule when he left last time

## 2024-08-09 NOTE — Telephone Encounter (Signed)
 I have refilled the Klonopin . I cannot do 90 day supplies for this because it is a controlled substance. Only 30 day supplies at a time. This medication should be last him 30 days, but not less than that.

## 2024-08-10 ENCOUNTER — Other Ambulatory Visit: Payer: Self-pay

## 2024-08-28 ENCOUNTER — Encounter: Admitting: Family Medicine

## 2024-09-04 ENCOUNTER — Telehealth: Payer: Self-pay

## 2024-09-04 MED ORDER — PREDNISONE 10 MG PO TABS: ORAL_TABLET | ORAL | 0 refills | Status: DC

## 2024-09-04 MED ORDER — AZITHROMYCIN 250 MG PO TABS: 250.0000 mg | ORAL_TABLET | Freq: Every day | ORAL | 0 refills | Status: DC

## 2024-09-04 NOTE — Telephone Encounter (Signed)
 Copied from CRM #[id number]. Topic: Clinical - Medication Question >> Sep 04, 2024  8:30 AM Essie A wrote: Reason for CRM: Patient has had a cough for about a month and wants to know if a nurse can call him in some medication.  Please return his call at [phone].  Thanks.  Called and spoke with the pt. Pt states has productive cough w/ white phlegm. Pt has had pneumonia vaccine & pt had 2nd dose in Sept/October.  Pt was prescribed levofloxacin  750mg  & linzolid 600mg  in September for his cough and it worked great.  Pt states cough is worsening & wheezing is present.  Pt states Tessalon  does not help w/ coughing.   Preferred pharmacy- [facility]  Dr. Geronimo please advise pt is requesting antibiotics.

## 2024-09-04 NOTE — Telephone Encounter (Signed)
 Do   Take prednisone  40 mg daily x 2 days, then 20mg  daily x 2 days, then 10mg  daily x 2 days, then 5mg  daily x 2 days and stop   Z pak   Please send  Allergies  Allergen Reactions   Codeine Anaphylaxis and Nausea And Vomiting   Onion Anaphylaxis and Swelling    thoart swells up   Bee Venom Hives

## 2024-09-04 NOTE — Telephone Encounter (Signed)
 Patient aware

## 2024-09-06 ENCOUNTER — Other Ambulatory Visit: Payer: Self-pay

## 2024-09-06 DIAGNOSIS — F411 Generalized anxiety disorder: Secondary | ICD-10-CM

## 2024-09-14 ENCOUNTER — Other Ambulatory Visit: Payer: Self-pay | Admitting: Internal Medicine

## 2024-09-18 ENCOUNTER — Ambulatory Visit: Payer: Self-pay | Admitting: Internal Medicine

## 2024-09-18 DIAGNOSIS — J209 Acute bronchitis, unspecified: Secondary | ICD-10-CM

## 2024-09-18 MED ORDER — DOXYCYCLINE HYCLATE 100 MG PO TABS: ORAL_TABLET | ORAL | 0 refills | Status: DC

## 2024-09-18 NOTE — Telephone Encounter (Signed)
 FYI Only or Action Required?: Action required by provider: medication refill request. Requesting another round of abx, states that he was given a zpak during thanksgiving but states it did not help much and is now having coughing episodes   Patient was last seen in primary care on 07/14/2024 by Gayle Saddie FALCON, PA-C.  Called Nurse Triage reporting Cough.  Symptoms began several weeks ago.  Interventions attempted: Prescription medications: albuterol  an spiriva .  Symptoms are: unchanged.  Triage Disposition: No disposition on file.  Patient/caregiver understands and will follow disposition?: Copied from CRM [id number]. Topic: Clinical - Red Word Triage >> Sep 18, 2024  1:32 PM Whitney O wrote: [facility] that prompted transfer to Nurse Triage: patient  calling about his severe cough and wanting antibiotics called in Dr geronimo in [place] Reason for Disposition  SEVERE coughing spells (e.g., whooping sound after coughing, vomiting after coughing)  Answer Assessment - Initial Assessment Questions 1. ONSET: When did the cough begin?      Ongoing since before thanksgiving 2. SEVERITY: How bad is the cough today?      mild 3. SPUTUM: Describe the color of your sputum (e.g., none, dry cough; clear, white, yellow, green)     white 4. HEMOPTYSIS: Are you coughing up any blood? If Yes, ask: How much? (e.g., flecks, streaks, tablespoons, etc.)     denies 5. DIFFICULTY BREATHING: Are you having difficulty breathing? If Yes, ask: How bad is it? (e.g., mild, moderate, severe)      denies 6. FEVER: Do you have a fever? If Yes, ask: What is your temperature, how was it measured, and when did it start?     denies 7. CARDIAC HISTORY: Do you have any history of heart disease? (e.g., heart attack, congestive heart failure)      stent 8. LUNG HISTORY: Do you have any history of lung disease?  (e.g., pulmonary embolus, asthma, emphysema)    States lost portion of lung d/t covid  pna 10. OTHER SYMPTOMS: Do you have any other symptoms? (e.g., runny nose, wheezing, chest pain)       denies  Protocols used: Cough - Acute Productive-A-AH E2C2 Pulmonary Triage - Initial Assessment Questions "Chief Complaint (e.g., cough, sob, wheezing, fever, chills, sweat or additional symptoms) *Go to specific symptom protocol after initial questions. Cough would like abx sent in   "How long have symptoms been present?" Since before Thanksgiving, was given a zpak states that it did not help much  MEDICINES:   "Have you used any OTC meds to help with symptoms?" Yes If yes, ask "What medications?" Albuterol  and spiriva   "Have you used your inhalers/maintenance medication?" Yes If yes, "What medications?" Spiriva  and albuterol   OXYGEN: "Do you wear supplemental oxygen?" No  "Do you monitor your oxygen levels?" No If yes, What is your reading (oxygen level) today? 96%  What is your usual oxygen saturation reading?  (Note: Pulmonary O2 sats should be 90% or greater) 90-96

## 2024-09-18 NOTE — Telephone Encounter (Signed)
 Take doxycycline  100mg  po twice daily x 5 days; take after meals and avoid sunlight - SENT

## 2024-09-19 NOTE — Telephone Encounter (Signed)
 ATC x1.  Left detailed message per DPR letting him know that antibiotic was sent in to his pharmacy.  I asked that he call the office after 10 am to let us  know he received the message.  Will await return call.

## 2024-09-20 NOTE — Telephone Encounter (Signed)
 I called and spoke with patient, he stated he works early hours until late and then went to sleep yesterday.  He apologized for not calling back.  I let him know it was ok, I was just calling to check on him.  He is getting the medication today.  I advised him to let us  know if he is not better after completing the medication.  He verbalized understanding.  Nothing further needed.

## 2024-10-13 ENCOUNTER — Ambulatory Visit

## 2024-10-13 VITALS — BP 133/84 | HR 89 | Temp 98.3°F | Ht 71.0 in | Wt 250.0 lb

## 2024-10-13 DIAGNOSIS — J9611 Chronic respiratory failure with hypoxia: Secondary | ICD-10-CM

## 2024-10-13 DIAGNOSIS — R7303 Prediabetes: Secondary | ICD-10-CM | POA: Diagnosis not present

## 2024-10-13 DIAGNOSIS — Z9981 Dependence on supplemental oxygen: Secondary | ICD-10-CM | POA: Diagnosis not present

## 2024-10-13 DIAGNOSIS — Z1211 Encounter for screening for malignant neoplasm of colon: Secondary | ICD-10-CM

## 2024-10-13 DIAGNOSIS — F411 Generalized anxiety disorder: Secondary | ICD-10-CM

## 2024-10-13 DIAGNOSIS — I2510 Atherosclerotic heart disease of native coronary artery without angina pectoris: Secondary | ICD-10-CM | POA: Diagnosis not present

## 2024-10-13 DIAGNOSIS — Z1159 Encounter for screening for other viral diseases: Secondary | ICD-10-CM

## 2024-10-13 DIAGNOSIS — J439 Emphysema, unspecified: Secondary | ICD-10-CM | POA: Diagnosis not present

## 2024-10-13 DIAGNOSIS — E785 Hyperlipidemia, unspecified: Secondary | ICD-10-CM

## 2024-10-13 DIAGNOSIS — I10 Essential (primary) hypertension: Secondary | ICD-10-CM

## 2024-10-13 DIAGNOSIS — E559 Vitamin D deficiency, unspecified: Secondary | ICD-10-CM | POA: Diagnosis not present

## 2024-10-13 DIAGNOSIS — F410 Panic disorder [episodic paroxysmal anxiety] without agoraphobia: Secondary | ICD-10-CM

## 2024-10-13 DIAGNOSIS — Z720 Tobacco use: Secondary | ICD-10-CM | POA: Diagnosis not present

## 2024-10-13 MED ORDER — CLONAZEPAM 1 MG PO TABS: ORAL_TABLET | ORAL | 0 refills | Status: AC

## 2024-10-13 NOTE — Assessment & Plan Note (Addendum)
 Chronic respiratory insufficiency due to left lung loss and scarring from COVID pneumonia. Oxygen saturation stable at 91%. - Has O2 at home to use if needed but reports he does not use it.  - Continue regular follow-up with pulmonologist Dr. Geronimo. - Use albuterol  and Spiriva  inhalers daily

## 2024-10-13 NOTE — Assessment & Plan Note (Addendum)
 History of essential hypertension with blood pressure initially above goal in office today, essentially at goal in office today. He is not on antihypertensive medications. Provided with blood pressure log and asked to drop off log to the office in 2-3 weeks. If BP is still above goal at home, patient would likely benefit from ARB therapy for kidney protection given his history of nephrectomy.

## 2024-10-13 NOTE — Assessment & Plan Note (Signed)
 Chronic respiratory insufficiency due to left lung loss and scarring from COVID pneumonia. Oxygen saturation stable at 91%. - Has O2 at home to use if needed but reports he does not use it.  - Continue regular follow-up with pulmonologist Dr. Geronimo. - Use albuterol  and Spiriva  inhalers daily

## 2024-10-13 NOTE — Patient Instructions (Signed)
 VISIT SUMMARY: You came in today for medication management and follow-up after your kidney surgery. We discussed your ongoing respiratory issues, coronary artery disease, blood pressure, anxiety, smoking habits, and vitamin D  deficiency. We also addressed your need for a colonoscopy and scheduled your Medicare wellness visit.  YOUR PLAN: GENERALIZED ANXIETY DISORDER: Your anxiety has worsened since your recent hospitalization, and your current dose of Klonopin  may not be enough. -Increased Klonopin  to 1 mg daily, with a limit of 30 tablets per month. -Prescription sent to [facility].  PULMONARY EMPHYSEMA: You have chronic emphysema with reduced lung capacity following COVID pneumonia. You have home oxygen available if needed. -Continue using Spiriva  as prescribed by Dr. Geronimo. -Ensure home oxygen is available if needed.  CORONARY ARTERY DISEASE: You have a history of coronary artery disease managed with aspirin  and atorvastatin . -Make sure to take aspirin  daily. -Continue following up with your cardiologist as needed.  ESSENTIAL HYPERTENSION: Your blood pressure has been slightly elevated, likely due to stress and activity. -We rechecked your blood pressure before you left. -Keep a blood pressure log at home and drop it off in 1-2 weeks.  HYPERLIPIDEMIA: Your cholesterol is managed with atorvastatin  prescribed by your cardiologist. -Continue your current management with your cardiologist.  VITAMIN D  DEFICIENCY: You have a history of low vitamin D  levels and are not currently taking supplements. -Start taking over-the-counter vitamin D  supplements.  TOBACCO USE: You are smoking about 5 cigarettes a day and have had trouble quitting with Chantix. -Consider using Wellbutrin or nicotine patches as alternatives. -Try to reduce the number of cigarettes you smoke.  GENERAL HEALTH MAINTENANCE: You are due for a colonoscopy and a Medicare wellness visit. -We resent the colonoscopy  referral to your preferred provider. -Your Medicare annual wellness visit has been scheduled.  If you have any problems before your next visit feel free to message me via MyChart (minor issues or questions) or call the office, otherwise you may reach out to schedule an office visit.  Thank you! Saddie Sacks, PA-C

## 2024-10-13 NOTE — Progress Notes (Signed)
 "  Established Patient Office Visit  Subjective   Patient ID: Matthew [name], male    DOB: [birth year]  Age: 60 y.o. MRN: [id number]  Chief Complaint  Patient presents with   Medical Management of Chronic Issues    HPI  Discussed the use of AI scribe software for clinical note transcription with the patient, who gave verbal consent to proceed.  History of Present Illness   Matthew [name] is a 59 year old male with coronary artery disease who presents for medication management and follow-up after kidney surgery.  Postoperative course following nephrectomy - Underwent kidney surgery on June 28, 2024. - Experienced postoperative complications requiring chest tube placement, which prolonged hospitalization. - Hospital discharge delayed from Thursday to Monday due to breathing difficulties. - History of COVID pneumonia with reduced lung capacity ('a lung and a half').  Respiratory symptoms and management - Breathing issues persist following surgery and prior COVID pneumonia. - Uses Spiriva  for respiratory symptoms. - Oxygen available at home but not used regularly.  Coronary artery disease and cardiac history - History of significant coronary artery disease managed with stents. - Resumed aspirin  therapy one week after kidney surgery. - On atorvastatin  for cholesterol management, prescribed by cardiologist.  Generalized Anxiety and Panic Disorder - Currently taking Klonopin  0.5 mg, usually once daily, occasionally twice daily as needed. - Finds Klonopin  sometimes ineffective during severe panic attacks and is considering dose adjustment. - Verbalizes that he thinks he could  make a 30 day supply last longer if the dose was increased because he is sometimes requiring two tablets to feel relief from a panic attack  - At baseline, is using one Klonopin  a day to control anxiety which he reports is generally effective until something triggers a panic attack   Tobacco use and  smoking cessation efforts - Smokes approximately five cigarettes per day, sporadically. - Previously attempted smoking cessation with Chantix, but found it intolerable. - Desires to quit smoking but has not found a suitable method. Declines medication assistance with smoking cessation today.   Vitamin d  deficiency - History of low vitamin D  levels. - Has vitamin D  supplements at home but does not take them regularly.  Colorectal cancer screening - Due for colonoscopy. - Scheduling difficulties due to communication issues with referred office. - Prefers to have colonoscopy performed at previous location - [facility] with Dr. Rollin          ROS Per HPI.    Objective:     BP 133/84   Pulse 89   Temp 98.3 F (36.8 C) (Oral)   Ht 5' 11 (1.803 m)   Wt 250 lb 0.6 oz (113.4 kg)   SpO2 91%   BMI 34.87 kg/m    Physical Exam Constitutional:      General: He is not in acute distress.    Appearance: Normal appearance.  Cardiovascular:     Rate and Rhythm: Normal rate and regular rhythm.     Heart sounds: Normal heart sounds. No murmur heard.    No friction rub. No gallop.  Pulmonary:     Effort: Pulmonary effort is normal. No respiratory distress.     Breath sounds: Normal breath sounds.  Musculoskeletal:        General: No swelling.  Skin:    General: Skin is warm and dry.  Neurological:     General: No focal deficit present.     Mental Status: He is alert.  Psychiatric:  Mood and Affect: Mood normal.        Behavior: Behavior normal.        Thought Content: Thought content normal.      No results found for any visits on 10/13/24.  Last CBC Lab Results  Component Value Date   WBC 19.8 (H) 07/14/2024   HGB 16.9 07/14/2024   HCT 52.3 (H) 07/14/2024   MCV 92 07/14/2024   MCH 29.9 07/14/2024   RDW 14.0 07/14/2024   PLT 269 07/14/2024   Last metabolic panel Lab Results  Component Value Date   GLUCOSE 62 (L) 07/14/2024   NA 137  07/14/2024   K 4.3 07/14/2024   CL 97 07/14/2024   CO2 25 07/14/2024   BUN 19 07/14/2024   CREATININE 1.05 07/14/2024   EGFR 82 07/14/2024   CALCIUM  9.3 07/14/2024   PHOS 3.8 07/03/2024   PROT 7.2 07/14/2024   ALBUMIN  4.2 07/14/2024   LABGLOB 3.0 07/14/2024   AGRATIO 1.8 09/15/2022   BILITOT 0.4 07/14/2024   ALKPHOS 141 (H) 07/14/2024   AST 21 07/14/2024   ALT 37 07/14/2024   ANIONGAP 13 07/03/2024   Last lipids Lab Results  Component Value Date   CHOL 210 (H) 07/14/2024   HDL 49 07/14/2024   LDLCALC 135 (H) 07/14/2024   TRIG 143 07/14/2024   CHOLHDL 4.3 07/14/2024   Last hemoglobin A1c Lab Results  Component Value Date   HGBA1C 6.9 (H) 07/14/2024   Last thyroid  functions Lab Results  Component Value Date   TSH 2.680 07/14/2024   Last vitamin D  Lab Results  Component Value Date   VD25OH 15.9 (L) 07/14/2024      The 10-year ASCVD risk score (Arnett DK, et al., 2019) is: 15.4%    Assessment & Plan:   Screening for viral disease -     Hepatitis C antibody; Future  Hyperlipidemia, unspecified hyperlipidemia type -     Hemoglobin A1c; Future -     Lipid panel; Future -     Comprehensive metabolic panel with GFR; Future -     CBC with Differential/Platelet; Future  Vitamin D  insufficiency  Prediabetes -     Hemoglobin A1c; Future  Pulmonary emphysema (HCC) Assessment & Plan: Chronic respiratory insufficiency due to left lung loss and scarring from COVID pneumonia. Oxygen saturation stable at 91%. - Has O2 at home to use if needed but reports he does not use it.  - Continue regular follow-up with pulmonologist Dr. Geronimo. - Use albuterol  and Spiriva  inhalers daily   Tobacco abuse Assessment & Plan: Ongoing tobacco abuse 1/2 pack/day. Patient not interested in medication-assisted cessation at this time. Currently trying to quit on his own.    Essential hypertension Assessment & Plan: History of essential hypertension with blood pressure  initially above goal in office today, essentially at goal in office today. He is not on antihypertensive medications. Provided with blood pressure log and asked to drop off log to the office in 2-3 weeks. If BP is still above goal at home, patient would likely benefit from ARB therapy for kidney protection given his history of nephrectomy.    Coronary artery disease involving native coronary artery of native heart without angina pectoris Assessment & Plan: History of CAD status post IVUS guided RCA PCI and stenting. Continue atorvastatin  40 mg and ASA 81 mg daily. Continue follow ups with cardiology.  Counseled on importance of smoking cessation. Will cont monitoring lipid panel.   Generalized anxiety disorder Assessment & Plan: Previously tried  and failed SSRIs with worsening of symptoms on those medications. Discussed addictive risk of benzos such as alprazolam .  Patient is doing well on Klonopin  0.5 mg 1-2 times daily. Feels that if the dose was increased, he may not need to take more than 1 pill daily because he has had to use 2 at a time on difficult days. Will increase to 1 mg daily. Discussed that we will not do more than 30 pills in a month supply. Pt verbalized understanding and was in agreement with the plan.  PDMP reviewed. No aberrancies.   Orders: -     clonazePAM ; Take one tablet daily as needed for anxiety. 1 bottle = 30 day supply  Dispense: 30 tablet; Refill: 0  Chronic respiratory failure with hypoxia, on home oxygen therapy (HCC) Assessment & Plan: Chronic respiratory insufficiency due to left lung loss and scarring from COVID pneumonia. Oxygen saturation stable at 91%. - Has O2 at home to use if needed but reports he does not use it.  - Continue regular follow-up with pulmonologist Dr. Geronimo. - Use albuterol  and Spiriva  inhalers daily   Panic disorder -     clonazePAM ; Take one tablet daily as needed for anxiety. 1 bottle = 30 day supply  Dispense: 30 tablet; Refill:  0  Screen for colon cancer -     Ambulatory referral to Gastroenterology   Return in about 4 months (around 02/10/2025) for HTN, HLD, Mood.    Saddie JULIANNA Sacks, PA-C "

## 2024-10-13 NOTE — Assessment & Plan Note (Signed)
 History of CAD status post IVUS guided RCA PCI and stenting. Continue atorvastatin  40 mg and ASA 81 mg daily. Continue follow ups with cardiology.  Counseled on importance of smoking cessation. Will cont monitoring lipid panel.

## 2024-10-13 NOTE — Assessment & Plan Note (Signed)
 Ongoing tobacco abuse 1/2 pack/day. Patient not interested in medication-assisted cessation at this time. Currently trying to quit on his own.

## 2024-10-13 NOTE — Assessment & Plan Note (Addendum)
 Previously tried and failed SSRIs with worsening of symptoms on those medications. Discussed addictive risk of benzos such as alprazolam .  Patient is doing well on Klonopin  0.5 mg 1-2 times daily. Feels that if the dose was increased, he may not need to take more than 1 pill daily because he has had to use 2 at a time on difficult days. Will increase to 1 mg daily. Discussed that we will not do more than 30 pills in a month supply. Pt verbalized understanding and was in agreement with the plan.  PDMP reviewed. No aberrancies.

## 2024-10-14 LAB — CBC WITH DIFFERENTIAL/PLATELET
Basophils Absolute: 0.1 x10E3/uL (ref 0.0–0.2)
Basos: 1 %
EOS (ABSOLUTE): 0.3 x10E3/uL (ref 0.0–0.4)
Eos: 2 %
Hematocrit: 50.7 % (ref 37.5–51.0)
Hemoglobin: 16.9 g/dL (ref 13.0–17.7)
Immature Grans (Abs): 0 x10E3/uL (ref 0.0–0.1)
Immature Granulocytes: 0 %
Lymphocytes Absolute: 2 x10E3/uL (ref 0.7–3.1)
Lymphs: 18 %
MCH: 30.3 pg (ref 26.6–33.0)
MCHC: 33.3 g/dL (ref 31.5–35.7)
MCV: 91 fL (ref 79–97)
Monocytes Absolute: 1 x10E3/uL — ABNORMAL HIGH (ref 0.1–0.9)
Monocytes: 9 %
Neutrophils Absolute: 7.9 x10E3/uL — ABNORMAL HIGH (ref 1.4–7.0)
Neutrophils: 70 %
Platelets: 239 x10E3/uL (ref 150–450)
RBC: 5.58 x10E6/uL (ref 4.14–5.80)
RDW: 13.5 % (ref 11.6–15.4)
WBC: 11.3 x10E3/uL — ABNORMAL HIGH (ref 3.4–10.8)

## 2024-10-14 LAB — LIPID PANEL
Chol/HDL Ratio: 5.3 ratio — ABNORMAL HIGH (ref 0.0–5.0)
Cholesterol, Total: 164 mg/dL (ref 100–199)
HDL: 31 mg/dL — ABNORMAL LOW
LDL Chol Calc (NIH): 112 mg/dL — ABNORMAL HIGH (ref 0–99)
Triglycerides: 117 mg/dL (ref 0–149)
VLDL Cholesterol Cal: 21 mg/dL (ref 5–40)

## 2024-10-14 LAB — COMPREHENSIVE METABOLIC PANEL WITH GFR
ALT: 19 IU/L (ref 0–44)
AST: 17 IU/L (ref 0–40)
Albumin: 3.9 g/dL (ref 3.8–4.9)
Alkaline Phosphatase: 136 IU/L — ABNORMAL HIGH (ref 47–123)
BUN/Creatinine Ratio: 10 (ref 10–24)
BUN: 11 mg/dL (ref 8–27)
Bilirubin Total: 0.3 mg/dL (ref 0.0–1.2)
CO2: 23 mmol/L (ref 20–29)
Calcium: 8.7 mg/dL (ref 8.6–10.2)
Chloride: 100 mmol/L (ref 96–106)
Creatinine, Ser: 1.05 mg/dL (ref 0.76–1.27)
Globulin, Total: 2.8 g/dL (ref 1.5–4.5)
Glucose: 81 mg/dL (ref 70–99)
Potassium: 4.1 mmol/L (ref 3.5–5.2)
Sodium: 139 mmol/L (ref 134–144)
Total Protein: 6.7 g/dL (ref 6.0–8.5)
eGFR: 81 mL/min/1.73

## 2024-10-14 LAB — HEMOGLOBIN A1C
Est. average glucose Bld gHb Est-mCnc: 143 mg/dL
Hgb A1c MFr Bld: 6.6 % — ABNORMAL HIGH (ref 4.8–5.6)

## 2024-10-14 LAB — HEPATITIS C ANTIBODY: Hep C Virus Ab: NONREACTIVE

## 2024-10-15 ENCOUNTER — Ambulatory Visit: Payer: Self-pay
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
## 2024-11-09 ENCOUNTER — Ambulatory Visit: Admitting: Internal Medicine

## 2024-11-09 ENCOUNTER — Encounter: Payer: Self-pay | Admitting: Internal Medicine

## 2024-11-09 VITALS — BP 134/86 | HR 95 | Temp 98.0°F | Ht 71.0 in | Wt 253.0 lb

## 2024-11-09 DIAGNOSIS — R0981 Nasal congestion: Secondary | ICD-10-CM

## 2024-11-09 DIAGNOSIS — U099 Post covid-19 condition, unspecified: Secondary | ICD-10-CM

## 2024-11-09 DIAGNOSIS — R0609 Other forms of dyspnea: Secondary | ICD-10-CM

## 2024-11-09 DIAGNOSIS — Z87891 Personal history of nicotine dependence: Secondary | ICD-10-CM | POA: Diagnosis not present

## 2024-11-09 DIAGNOSIS — J439 Emphysema, unspecified: Secondary | ICD-10-CM | POA: Diagnosis not present

## 2024-11-09 DIAGNOSIS — Z85528 Personal history of other malignant neoplasm of kidney: Secondary | ICD-10-CM | POA: Diagnosis not present

## 2024-11-09 DIAGNOSIS — J329 Chronic sinusitis, unspecified: Secondary | ICD-10-CM

## 2024-11-09 DIAGNOSIS — J8410 Pulmonary fibrosis, unspecified: Secondary | ICD-10-CM | POA: Diagnosis not present

## 2024-11-09 MED ORDER — AZELASTINE HCL 0.1 % NA SOLN: 2.0000 | Freq: Two times a day (BID) | NASAL | 12 refills | Status: AC

## 2024-11-09 NOTE — Patient Instructions (Addendum)
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) Pulmonary emphysema, unspecified emphysema type (HCC)  =currently stable  Plan  - cotninue spiriva  scheduled - encourage you talk to PCP regrding weight loss drugs  - get PFT in July 2026   Nasal Congestion  Plan  - astelin  daily start   Left upper lobe Lung nodule 1cm Feb 2024 (new since feb 2023 - -> 0.54mm June 2024) -> stable Nov 2024 - > resolved July 2025 PRior history smoking   Plan  -refer lung cancer screening program; you need a cT in July 2026   Right kidney mass 10cm feb 2024  -  But Sept 2025 needing partial nephrectomy - biopsy indicating renal cell carcima  Pla  -Per Dr Alvaro    Follow-up -6 months with app after PFT and CT "

## 2025-02-16 ENCOUNTER — Ambulatory Visit
